# Patient Record
Sex: Female | Born: 1980 | Race: White | Hispanic: No | Marital: Single | Smoking: Current every day smoker
Health system: Southern US, Community
[De-identification: ages and names within clinical notes are randomized; demographics above are authoritative.]

## PROBLEM LIST (undated history)

## (undated) DIAGNOSIS — M62838 Other muscle spasm: Secondary | ICD-10-CM

## (undated) DIAGNOSIS — C4491 Basal cell carcinoma of skin, unspecified: Secondary | ICD-10-CM

## (undated) DIAGNOSIS — F988 Other specified behavioral and emotional disorders with onset usually occurring in childhood and adolescence: Secondary | ICD-10-CM

## (undated) DIAGNOSIS — F419 Anxiety disorder, unspecified: Secondary | ICD-10-CM

## (undated) DIAGNOSIS — I10 Essential (primary) hypertension: Secondary | ICD-10-CM

## (undated) DIAGNOSIS — Q230 Congenital stenosis of aortic valve: Secondary | ICD-10-CM

## (undated) DIAGNOSIS — K219 Gastro-esophageal reflux disease without esophagitis: Secondary | ICD-10-CM

## (undated) DIAGNOSIS — Z7901 Long term (current) use of anticoagulants: Secondary | ICD-10-CM

## (undated) DIAGNOSIS — T8859XA Other complications of anesthesia, initial encounter: Secondary | ICD-10-CM

## (undated) DIAGNOSIS — T7840XA Allergy, unspecified, initial encounter: Secondary | ICD-10-CM

## (undated) DIAGNOSIS — M1990 Unspecified osteoarthritis, unspecified site: Secondary | ICD-10-CM

## (undated) DIAGNOSIS — Z87410 Personal history of cervical dysplasia: Secondary | ICD-10-CM

## (undated) DIAGNOSIS — R42 Dizziness and giddiness: Secondary | ICD-10-CM

## (undated) DIAGNOSIS — H9190 Unspecified hearing loss, unspecified ear: Secondary | ICD-10-CM

## (undated) DIAGNOSIS — F411 Generalized anxiety disorder: Secondary | ICD-10-CM

## (undated) DIAGNOSIS — N903 Dysplasia of vulva, unspecified: Secondary | ICD-10-CM

## (undated) DIAGNOSIS — Z954 Presence of other heart-valve replacement: Secondary | ICD-10-CM

## (undated) DIAGNOSIS — Z8049 Family history of malignant neoplasm of other genital organs: Secondary | ICD-10-CM

## (undated) DIAGNOSIS — E785 Hyperlipidemia, unspecified: Secondary | ICD-10-CM

## (undated) DIAGNOSIS — F319 Bipolar disorder, unspecified: Secondary | ICD-10-CM

## (undated) DIAGNOSIS — F1011 Alcohol abuse, in remission: Secondary | ICD-10-CM

## (undated) DIAGNOSIS — N891 Moderate vaginal dysplasia: Secondary | ICD-10-CM

## (undated) DIAGNOSIS — G8929 Other chronic pain: Secondary | ICD-10-CM

## (undated) DIAGNOSIS — R51 Headache: Secondary | ICD-10-CM

## (undated) DIAGNOSIS — H9325 Central auditory processing disorder: Secondary | ICD-10-CM

## (undated) DIAGNOSIS — R519 Headache, unspecified: Secondary | ICD-10-CM

## (undated) DIAGNOSIS — Q231 Congenital insufficiency of aortic valve: Secondary | ICD-10-CM

## (undated) DIAGNOSIS — R87619 Unspecified abnormal cytological findings in specimens from cervix uteri: Secondary | ICD-10-CM

## (undated) DIAGNOSIS — Z720 Tobacco use: Secondary | ICD-10-CM

## (undated) HISTORY — DX: Congenital insufficiency of aortic valve: Q23.1

## (undated) HISTORY — DX: Other specified behavioral and emotional disorders with onset usually occurring in childhood and adolescence: F98.8

## (undated) HISTORY — PX: TYMPANOSTOMY TUBE PLACEMENT: SHX32

## (undated) HISTORY — DX: Family history of malignant neoplasm of other genital organs: Z80.49

## (undated) HISTORY — DX: Moderate vaginal dysplasia: N89.1

## (undated) HISTORY — DX: Essential (primary) hypertension: I10

## (undated) HISTORY — DX: Congenital stenosis of aortic valve: Q23.0

## (undated) HISTORY — DX: Allergy, unspecified, initial encounter: T78.40XA

## (undated) HISTORY — DX: Alcohol abuse, in remission: F10.11

## (undated) HISTORY — DX: Headache, unspecified: R51.9

## (undated) HISTORY — DX: Unspecified abnormal cytological findings in specimens from cervix uteri: R87.619

## (undated) HISTORY — DX: Unspecified osteoarthritis, unspecified site: M19.90

## (undated) HISTORY — DX: Bipolar disorder, unspecified: F31.9

## (undated) HISTORY — DX: Tobacco use: Z72.0

## (undated) HISTORY — PX: HERNIA REPAIR: SHX51

## (undated) HISTORY — PX: INGUINAL HERNIA REPAIR: SHX194

## (undated) HISTORY — DX: Basal cell carcinoma of skin, unspecified: C44.91

## (undated) HISTORY — DX: Headache: R51

## (undated) HISTORY — PX: HIP SURGERY: SHX245

---
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
## 2000-02-09 ENCOUNTER — Other Ambulatory Visit: Admission: RE | Admit: 2000-02-09 | Discharge: 2000-02-09 | Payer: Self-pay | Admitting: *Deleted

## 2000-04-09 ENCOUNTER — Encounter (INDEPENDENT_AMBULATORY_CARE_PROVIDER_SITE_OTHER): Payer: Self-pay

## 2000-04-09 ENCOUNTER — Other Ambulatory Visit: Admission: RE | Admit: 2000-04-09 | Discharge: 2000-04-09 | Payer: Self-pay | Admitting: Obstetrics and Gynecology

## 2000-07-31 HISTORY — PX: COLPOSCOPY: SHX161

## 2000-08-20 ENCOUNTER — Other Ambulatory Visit: Admission: RE | Admit: 2000-08-20 | Discharge: 2000-08-20 | Payer: Self-pay | Admitting: Obstetrics and Gynecology

## 2000-08-23 ENCOUNTER — Encounter (INDEPENDENT_AMBULATORY_CARE_PROVIDER_SITE_OTHER): Payer: Self-pay | Admitting: Specialist

## 2000-08-23 ENCOUNTER — Other Ambulatory Visit: Admission: RE | Admit: 2000-08-23 | Discharge: 2000-08-23 | Payer: Self-pay | Admitting: Obstetrics and Gynecology

## 2000-11-30 HISTORY — PX: NASAL SEPTUM SURGERY: SHX37

## 2000-12-21 ENCOUNTER — Other Ambulatory Visit: Admission: RE | Admit: 2000-12-21 | Discharge: 2000-12-21 | Payer: Self-pay | Admitting: Obstetrics and Gynecology

## 2001-05-02 ENCOUNTER — Other Ambulatory Visit: Admission: RE | Admit: 2001-05-02 | Discharge: 2001-05-02 | Payer: Self-pay | Admitting: Obstetrics and Gynecology

## 2001-06-30 HISTORY — PX: COLPOSCOPY: SHX161

## 2001-11-21 ENCOUNTER — Other Ambulatory Visit: Admission: RE | Admit: 2001-11-21 | Discharge: 2001-11-21 | Payer: Self-pay | Admitting: Obstetrics and Gynecology

## 2002-05-20 ENCOUNTER — Emergency Department (HOSPITAL_COMMUNITY): Admission: EM | Admit: 2002-05-20 | Discharge: 2002-05-20 | Payer: Self-pay | Admitting: Emergency Medicine

## 2002-08-10 ENCOUNTER — Other Ambulatory Visit: Admission: RE | Admit: 2002-08-10 | Discharge: 2002-08-10 | Payer: Self-pay | Admitting: Obstetrics and Gynecology

## 2004-01-04 ENCOUNTER — Other Ambulatory Visit: Admission: RE | Admit: 2004-01-04 | Discharge: 2004-01-04 | Payer: Self-pay | Admitting: Obstetrics and Gynecology

## 2004-08-01 ENCOUNTER — Other Ambulatory Visit: Admission: RE | Admit: 2004-08-01 | Discharge: 2004-08-01 | Payer: Self-pay | Admitting: Obstetrics and Gynecology

## 2004-08-30 HISTORY — PX: COLPOSCOPY: SHX161

## 2004-10-15 ENCOUNTER — Encounter: Admission: RE | Admit: 2004-10-15 | Discharge: 2004-10-15 | Payer: Self-pay | Admitting: Internal Medicine

## 2005-02-26 ENCOUNTER — Ambulatory Visit (HOSPITAL_COMMUNITY): Admission: RE | Admit: 2005-02-26 | Discharge: 2005-02-26 | Payer: Self-pay | Admitting: Internal Medicine

## 2005-04-29 ENCOUNTER — Other Ambulatory Visit: Admission: RE | Admit: 2005-04-29 | Discharge: 2005-04-29 | Payer: Self-pay | Admitting: *Deleted

## 2006-10-11 ENCOUNTER — Other Ambulatory Visit: Admission: RE | Admit: 2006-10-11 | Discharge: 2006-10-11 | Payer: Self-pay | Admitting: Obstetrics & Gynecology

## 2006-12-27 ENCOUNTER — Ambulatory Visit (HOSPITAL_COMMUNITY): Admission: RE | Admit: 2006-12-27 | Discharge: 2006-12-27 | Payer: Self-pay | Admitting: Obstetrics & Gynecology

## 2008-07-20 ENCOUNTER — Emergency Department (HOSPITAL_COMMUNITY): Admission: EM | Admit: 2008-07-20 | Discharge: 2008-07-20 | Payer: Self-pay | Admitting: Emergency Medicine

## 2008-10-30 HISTORY — PX: COLPOSCOPY: SHX161

## 2008-11-05 ENCOUNTER — Other Ambulatory Visit: Admission: RE | Admit: 2008-11-05 | Discharge: 2008-11-05 | Payer: Self-pay | Admitting: Obstetrics and Gynecology

## 2008-11-30 HISTORY — PX: CERVICAL BIOPSY  W/ LOOP ELECTRODE EXCISION: SUR135

## 2009-02-06 HISTORY — PX: ABDOMINAL HYSTERECTOMY: SHX81

## 2009-02-26 ENCOUNTER — Encounter: Payer: Self-pay | Admitting: Obstetrics and Gynecology

## 2009-02-26 ENCOUNTER — Ambulatory Visit (HOSPITAL_COMMUNITY): Admission: RE | Admit: 2009-02-26 | Discharge: 2009-02-27 | Payer: Self-pay | Admitting: Obstetrics and Gynecology

## 2009-02-26 HISTORY — PX: ROBOTIC ASSISTED TOTAL HYSTERECTOMY: SHX6085

## 2010-11-30 DIAGNOSIS — F1011 Alcohol abuse, in remission: Secondary | ICD-10-CM

## 2010-11-30 HISTORY — DX: Alcohol abuse, in remission: F10.11

## 2010-12-21 ENCOUNTER — Encounter: Payer: Self-pay | Admitting: Obstetrics & Gynecology

## 2010-12-23 LAB — HM PAP SMEAR: HM Pap smear: NORMAL

## 2011-02-13 ENCOUNTER — Ambulatory Visit: Payer: Self-pay | Admitting: Internal Medicine

## 2011-03-12 LAB — COMPREHENSIVE METABOLIC PANEL
ALT: 27 U/L (ref 0–35)
AST: 38 U/L — ABNORMAL HIGH (ref 0–37)
Albumin: 4 g/dL (ref 3.5–5.2)
Alkaline Phosphatase: 112 U/L (ref 39–117)
BUN: 9 mg/dL (ref 6–23)
CO2: 24 mEq/L (ref 19–32)
Calcium: 9.2 mg/dL (ref 8.4–10.5)
Chloride: 102 mEq/L (ref 96–112)
Creatinine, Ser: 0.7 mg/dL (ref 0.4–1.2)
GFR calc Af Amer: >60 mL/min (ref >60)
GFR calc non Af Amer: >60 mL/min (ref >60)
Glucose, Bld: 93 mg/dL (ref 70–99)
Potassium: 3.6 mEq/L (ref 3.5–5.1)
Sodium: 138 mEq/L (ref 135–145)
Total Bilirubin: 0.6 mg/dL (ref 0.3–1.2)
Total Protein: 6.8 g/dL (ref 6.0–8.3)

## 2011-03-12 LAB — CBC
HCT: 34 % — ABNORMAL LOW (ref 36.0–46.0)
HCT: 43.3 % (ref 36.0–46.0)
Hemoglobin: 11.9 g/dL — ABNORMAL LOW (ref 12.0–15.0)
Hemoglobin: 15 g/dL (ref 12.0–15.0)
MCHC: 34.6 g/dL (ref 30.0–36.0)
MCHC: 35 g/dL (ref 30.0–36.0)
MCV: 94.4 fL (ref 78.0–100.0)
MCV: 94.6 fL (ref 78.0–100.0)
Platelets: 151 10*3/uL (ref 150–400)
Platelets: 82 10*3/uL — ABNORMAL LOW (ref 150–400)
RBC: 3.59 MIL/uL — ABNORMAL LOW (ref 3.87–5.11)
RBC: 4.58 MIL/uL (ref 3.87–5.11)
RDW: 13.9 % (ref 11.5–15.5)
RDW: 14.1 % (ref 11.5–15.5)
WBC: 4.5 10*3/uL (ref 4.0–10.5)
WBC: 6.2 10*3/uL (ref 4.0–10.5)

## 2011-03-12 LAB — TYPE AND SCREEN
ABO/RH(D): O POS
Antibody Screen: NEGATIVE

## 2011-03-12 LAB — HCG, SERUM, QUALITATIVE: Preg, Serum: NEGATIVE

## 2011-03-12 LAB — ABO/RH: ABO/RH(D): O POS

## 2011-03-16 ENCOUNTER — Ambulatory Visit: Payer: Self-pay | Admitting: Internal Medicine

## 2011-04-14 NOTE — Op Note (Signed)
NAME:  Danielle Obrien, Danielle Obrien              ACCOUNT NO.:  [id number]   MEDICAL RECORD NO.:  [id number]          PATIENT TYPE:  OIB   LOCATION:  0098                         FACILITY:  [facility]   PHYSICIAN:  Cynthia P. 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   PROCEDURE:  Robotic total laparoscopic hysterectomy.   SURGEON:  Cynthia P. Romine, M.D.   ASSISTANT:  Dr. Leda Quail.   ANESTHESIA:  General endotracheal.   ESTIMATED BLOOD LOSS:  100 mL.   COMPLICATIONS:  None.   PROCEDURE:  The patient was taken to the operating room and placed in  the dorsal lithotomy position before anesthesia because she had  congenital dislocation of the hip and we want to be sure that she was  comfortable with her legs in the Tan[place]a stirrups.  After prepping and  draping, a posterior weighted and anterior Sims retractor were placed.  The cervix grasped on its anterior lip with a single-tooth tenaculum.  The vagina was very long and the instruments needed to be exchanged out  for a longer posterior weighted retractor in order to even visualize the  cervix.  The cervix was dilated to about a #23 Shawnie Pons and uterus sounded  to 8 cm.  A number 8 Rumi manipulator was inserted and the balloon  inflated with 5 mL of saline.  A vaginal occluder was inflated with 60  mL of air.  A Foley catheter was inserted.  Attention was then turned to  the abdomen.  A small incision was made just above the umbilicus after  infiltrating the skin with 0.25% Marcaine plain.  The incision was  carried down to the fascia bluntly.  The fascia was grasped with Kochers  and entered with Mayo scissors.   Underlying peritoneum was entered  bluntly.  The fascia was grasped with pickups with teeth and a  pursestring suture of 0 Vicryl was placed around the fascia.  A Hasson  trocar was then inserted into the peritoneal space with proper placement  noted with the robotic laparoscope.  A pneumoperitoneum was then created  with 2 liters of CO2.  The accessory ports were marked on the skin  approximately 10 cm lateral to the umbilicus and the robotic trocars  were inserted under direct visualization as was the assistance port  which was in the right lower quadrant the.  The patient was placed in  the Trendelenburg.  The pelvis was inspected.  There were several filmy  adhesions of omentum to the pelvic sidewall and also of the anterior  peritoneum to the fundus of the uterus.  The patient was status post  repair of bilateral hernias.  The uterus appeared to be of normal size.  The posterior cul-de-sac was free.  The left tube and ovary appeared  normal.  The right tube and ovary were  absent.  The ureters were  identified.  The patient was placed in steep Trendelenburg and the robot  was brought in and docked.  The monopolar scissors were put in the first  port and the Gyrus PK forceps were put in the second port.  The round  ligament on the patient's right was identified, cauterized and cut.  The  adhesions were taken down sharply that were formerly mentioned.  The  posterior and anterior leaf of the broad ligament were taken down  sharply.  The bladder was dissected off the cervix sharply.  On the  patient's right the pedicle containing the utero-ovarian ligament and  tube were cauterized and cut as was the round ligament.  The anterior  and posterior leaves of broad ligament were taken down sharply.  The  bladder was further dissected free off the cervix.  The Koh ring was  clearly visible and palpable.  The uterine arteries were skeletonized,  cauterized and cut on each side.  Monopolar  cautery was used to enter  the vagina anteriorly over the [place] ring and carried around  circumferentially.  The specimen was removed through the vagina and it  was actually left in place for the vaginal cuff closure to act as an  occluder.  The vaginal cuff was closed with 4 figure-of-eight sutures of  0 Vicryl.  Good hemostasis was noted.  The pelvis was irrigated.  All  the pedicles were inspected and felt to be hemostatic.  The ureters were  again identified bilaterally and were seen peristalsing.  The  instruments were removed from the abdomen.  The robot was undocked and  taken away.  The patient was flattened out.  The instruments were  removed under direct visualization.  Pneumoperitoneum was allowed to  escape.  The trocars were all removed.  The pursestring suture was  closed and then formally placed in the fascia at the umbilicus.  A  figure-of-eight suture of 0 Vicryl was placed in the assistance port to  close the fascia.  The skin was closed subcuticularly with 4-0 Vicryl  Rapide and Dermabond.  The specimen was removed from vagina and sent to  pathology and the procedure was terminated.  The patient tolerated it  well and went in satisfactory condition to post anesthesia recovery.      Cynthia P. Romine, M.D.  Electronically Signed     CPR/MEDQ  D:  02/26/2009  T:  02/26/2009  Job:  [id number]

## 2011-04-15 ENCOUNTER — Ambulatory Visit: Payer: Self-pay | Admitting: Internal Medicine

## 2011-04-21 ENCOUNTER — Telehealth: Payer: Self-pay | Admitting: *Deleted

## 2011-04-21 ENCOUNTER — Encounter: Payer: Self-pay | Admitting: Internal Medicine

## 2011-04-21 ENCOUNTER — Ambulatory Visit (INDEPENDENT_AMBULATORY_CARE_PROVIDER_SITE_OTHER)
Admission: RE | Admit: 2011-04-21 | Discharge: 2011-04-21 | Disposition: A | Discharge status: Home / Self Care | Payer: PRIVATE HEALTH INSURANCE | Source: Ambulatory Visit | Attending: Internal Medicine | Admitting: Internal Medicine

## 2011-04-21 ENCOUNTER — Ambulatory Visit (INDEPENDENT_AMBULATORY_CARE_PROVIDER_SITE_OTHER): Payer: PRIVATE HEALTH INSURANCE | Admitting: Internal Medicine

## 2011-04-21 DIAGNOSIS — R059 Cough, unspecified: Secondary | ICD-10-CM

## 2011-04-21 DIAGNOSIS — I10 Essential (primary) hypertension: Secondary | ICD-10-CM

## 2011-04-21 DIAGNOSIS — R05 Cough: Secondary | ICD-10-CM | POA: Insufficient documentation

## 2011-04-21 DIAGNOSIS — J209 Acute bronchitis, unspecified: Secondary | ICD-10-CM

## 2011-04-21 DIAGNOSIS — J4530 Mild persistent asthma, uncomplicated: Secondary | ICD-10-CM | POA: Insufficient documentation

## 2011-04-21 DIAGNOSIS — J45909 Unspecified asthma, uncomplicated: Secondary | ICD-10-CM

## 2011-04-21 DIAGNOSIS — S73005A Unspecified dislocation of left hip, initial encounter: Secondary | ICD-10-CM

## 2011-04-21 MED ORDER — MOXIFLOXACIN HCL 400 MG PO TABS: 400.0000 mg | ORAL_TABLET | Freq: Every day | ORAL | Status: AC

## 2011-04-21 MED ORDER — FLUTICASONE-SALMETEROL 250-50 MCG/DOSE IN AEPB: 1.0000 | INHALATION_SPRAY | Freq: Two times a day (BID) | RESPIRATORY_TRACT | Status: DC

## 2011-04-21 NOTE — Telephone Encounter (Signed)
Patient requesting a call back, has question about ortho referral.

## 2011-04-21 NOTE — Progress Notes (Signed)
Subjective:    Patient ID: Danielle Obrien, female    DOB: 04/18/1981, 30 y.o.   MRN: [id number]  URI  This is a new problem. The current episode started 1 to 4 weeks ago. The problem has been gradually worsening. There has been no fever. Associated symptoms include coughing, rhinorrhea, sneezing, a sore throat and wheezing. Pertinent negatives include no abdominal pain, chest pain, congestion, diarrhea, dysuria, ear pain, headaches, joint pain, joint swelling, nausea, neck pain, plugged ear sensation, rash, sinus pain, swollen glands or vomiting. She has tried nothing for the symptoms.  Hypertension This is a chronic problem. The current episode started more than 1 year ago. The problem has been gradually improving since onset. The problem is controlled. Pertinent negatives include no anxiety, blurred vision, chest pain, headaches, neck pain, orthopnea, palpitations, peripheral edema, PND, shortness of breath or sweats. There are no associated agents to hypertension. Past treatments include angiotensin blockers. The current treatment provides significant improvement. There are no compliance problems.    Also, she is having trouble with her left foot and leg, she feels like it is causing her to catch it on objects on the floor which makes her trip and fall. This has been a problem over the last year. She has not injured herself from any falls. 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    Review of Systems  Constitutional: Positive for chills and fatigue. Negative for fever, diaphoresis, activity change, appetite change and unexpected weight change.  HENT: Positive for sore throat, rhinorrhea and sneezing. Negative for ear pain, nosebleeds, congestion, facial swelling, mouth sores, trouble swallowing, neck pain, neck stiffness, voice change, postnasal drip and sinus pressure.   Eyes: Negative for blurred vision.  Respiratory: Positive for cough  and wheezing. Negative for shortness of breath.   Cardiovascular: Negative for chest pain, palpitations, orthopnea and PND.  Gastrointestinal: Negative for nausea, vomiting, abdominal pain, diarrhea and blood in stool.  Genitourinary: Negative for dysuria, urgency, frequency, hematuria, flank pain, decreased urine volume, enuresis, difficulty urinating, genital sores and dyspareunia.  Musculoskeletal: Negative for joint pain.  Skin: Negative for color change, pallor and rash.  Neurological: Negative for dizziness, tremors, seizures, syncope, facial asymmetry, speech difficulty, weakness, light-headedness, numbness and headaches.  Hematological: Negative for adenopathy. Does not bruise/bleed easily.  Psychiatric/Behavioral: Positive for decreased concentration (she has ADHD). Negative for suicidal ideas, hallucinations, behavioral problems, confusion, sleep disturbance, self-injury, dysphoric mood and agitation. The patient is not nervous/anxious and is not hyperactive.        Objective:   Physical Exam  Constitutional: She is oriented to person, place, and time. She appears well-developed and well-nourished. No distress.  HENT:  Head: Normocephalic and atraumatic.  Right Ear: External ear normal.  Left Ear: External ear normal.  Nose: Nose normal.  Mouth/Throat: Oropharynx is clear and moist. No oropharyngeal exudate.  Eyes: Conjunctivae and EOM are normal. Pupils are equal, round, and reactive to light. Right eye exhibits no discharge. Left eye exhibits no discharge. No scleral icterus.  Neck: Normal range of motion. Neck supple. No JVD present. No tracheal deviation present. No thyromegaly present.  Cardiovascular: Normal rate, regular rhythm and intact distal pulses.  Exam reveals no gallop and no friction rub.   Murmur heard. Pulmonary/Chest: Effort normal and breath sounds normal. No stridor. No respiratory distress. She has no wheezes. She has no rales. She exhibits no tenderness.    Abdominal: Soft. Bowel sounds are  normal. She exhibits no distension and no mass. There is no tenderness. There is no rebound and no guarding.  Musculoskeletal: Normal range of motion. She exhibits no edema and no tenderness.       Left foot is slightly inverted compared to the right  Lymphadenopathy:    She has no cervical adenopathy.  Neurological: She is alert and oriented to person, place, and time. She has normal reflexes. She displays normal reflexes. No cranial nerve deficit. She exhibits normal muscle tone. Coordination normal.  Skin: Skin is warm and dry. No rash noted. She is not diaphoretic. No erythema. No pallor.  Psychiatric: She has a normal mood and affect. Her behavior is normal. Judgment and thought content normal.          Assessment & Plan:

## 2011-04-21 NOTE — Patient Instructions (Signed)
Asthma, Adult Asthma is caused by narrowing of the air passages in the lungs. It may be triggered by pollen, dust, animal dander, molds, some foods, respiratory infections, exposure to smoke, exercise, emotional stress or other allergens (things that cause allergic reactions or allergies). Repeat attacks are common. HOME CARE INSTRUCTIONS  Use prescription medications as ordered by your caregiver.   Avoid pollen, dust, animal dander, molds, smoke and other things that cause attacks at home and at work.   You may have fewer attacks if you decrease dust in your home. Electrostatic air cleaners may help.   It may help to replace your pillows or mattress with materials less likely to cause allergies.   Talk to your caregiver about an action plan for managing asthma attacks at home, including, the use of a peak flow meter which measures the severity of your asthma attack. An action plan can help minimize or stop the attack without having to seek medical care.   If you are not on a fluid restriction, drink 8 to 10 glasses of water each day.   Always have a plan prepared for seeking medical attention, including, calling your physician, accessing local emergency care, and calling 911 (in the [place] for a severe attack.   Discuss possible exercise routines with your caregiver.   If animal dander is the cause of asthma, you may need to get rid of pets.  SEEK MEDICAL CARE IF:  You have wheezing and shortness of breath even if taking medicine to prevent attacks.   An oral temperature above 100.5 develops.   You have muscle aches, chest pain or thickening of sputum.   Your sputum changes from clear or white to yellow, green, gray or bloody.   You have any problems that may be related to the medicine you are taking (such as a rash, itching, swelling or trouble breathing).  SEEK IMMEDIATE MEDICAL CARE IF:  Your usual medicines do not stop your wheezing or there is increased coughing and/or  shortness of breath.   You have increased difficulty breathing.   You have an oral temperature above 100.5, not controlled by medicine.  MAKE SURE YOU:  Understand these instructions.   Will watch your condition.   Will get help right away if you are not doing well or get worse.  Document Released: 11/16/2005 Document Re-Released: 12/08/2009 Exit[employer] Patient Information 2011 [employer]. 

## 2011-04-22 NOTE — Telephone Encounter (Signed)
Returned call, lmovm for pt to call back and advise how we may help.

## 2011-04-23 NOTE — Telephone Encounter (Signed)
LMOVM for pt to call back 

## 2011-04-24 ENCOUNTER — Telehealth: Payer: Self-pay | Admitting: *Deleted

## 2011-04-24 NOTE — Telephone Encounter (Signed)
Called patient/ lmovm x several days, closing phone note due to no call back

## 2011-04-24 NOTE — Telephone Encounter (Signed)
Pt says MD was going to refer her to ortho for her hip? IF ok please put in referral.

## 2011-04-27 ENCOUNTER — Encounter: Payer: Self-pay | Admitting: Internal Medicine

## 2011-04-27 DIAGNOSIS — S73005A Unspecified dislocation of left hip, initial encounter: Secondary | ICD-10-CM | POA: Insufficient documentation

## 2011-04-27 NOTE — Assessment & Plan Note (Signed)
Check a cxr for pna, masses, edema 

## 2011-04-27 NOTE — Assessment & Plan Note (Signed)
Ortho referral  

## 2011-04-27 NOTE — Assessment & Plan Note (Signed)
Start advair diskus 

## 2011-04-27 NOTE — Assessment & Plan Note (Signed)
Start avelox and zutripro 

## 2011-04-27 NOTE — Assessment & Plan Note (Signed)
Her BP is well controlled 

## 2011-05-18 ENCOUNTER — Telehealth: Payer: Self-pay

## 2011-05-18 NOTE — Telephone Encounter (Signed)
Patient called  Stating that MD referred her to ortho for her hip x 3 wks ago. She is calling to check status of referral. Please advise

## 2011-09-18 ENCOUNTER — Telehealth: Payer: Self-pay

## 2011-09-18 MED ORDER — VALSARTAN 160 MG PO TABS: 160.0000 mg | ORAL_TABLET | Freq: Every day | ORAL | Status: DC

## 2011-09-18 NOTE — Telephone Encounter (Signed)
Grandmother stopped by and discussed with her. Rx sent in and pt advised to schedule appt

## 2011-09-18 NOTE — Telephone Encounter (Signed)
Patient called lmovm requesting refills on BP med. Patient last seen 03/2011, but no medication on current list. Tried to return call to patient/ no answer. Will try again later

## 2011-09-22 ENCOUNTER — Telehealth: Payer: Self-pay

## 2011-09-22 DIAGNOSIS — I10 Essential (primary) hypertension: Secondary | ICD-10-CM

## 2011-09-22 MED ORDER — LOSARTAN POTASSIUM 100 MG PO TABS: 100.0000 mg | ORAL_TABLET | Freq: Every day | ORAL | Status: DC

## 2011-09-22 NOTE — Telephone Encounter (Signed)
Per pharmacy,diovan not covered by insurance. Alternatives are Losartan and irbesartan

## 2011-09-22 NOTE — Telephone Encounter (Signed)
done

## 2011-10-20 ENCOUNTER — Other Ambulatory Visit (INDEPENDENT_AMBULATORY_CARE_PROVIDER_SITE_OTHER): Payer: PRIVATE HEALTH INSURANCE

## 2011-10-20 ENCOUNTER — Other Ambulatory Visit: Payer: Self-pay | Admitting: Internal Medicine

## 2011-10-20 ENCOUNTER — Encounter: Payer: Self-pay | Admitting: Internal Medicine

## 2011-10-20 ENCOUNTER — Ambulatory Visit (INDEPENDENT_AMBULATORY_CARE_PROVIDER_SITE_OTHER): Payer: PRIVATE HEALTH INSURANCE | Admitting: Internal Medicine

## 2011-10-20 VITALS — BP 108/76 | HR 76 | Temp 98.0°F | Resp 16 | Ht 60.0 in | Wt 129.5 lb

## 2011-10-20 DIAGNOSIS — Z0000 Encounter for general adult medical examination without abnormal findings: Secondary | ICD-10-CM

## 2011-10-20 DIAGNOSIS — Z23 Encounter for immunization: Secondary | ICD-10-CM

## 2011-10-20 LAB — COMPREHENSIVE METABOLIC PANEL
ALT: 13 U/L (ref 0–35)
AST: 16 U/L (ref 0–37)
Albumin: 4.5 g/dL (ref 3.5–5.2)
Alkaline Phosphatase: 87 U/L (ref 39–117)
BUN: 6 mg/dL (ref 6–23)
CO2: 25 mEq/L (ref 19–32)
Calcium: 9.8 mg/dL (ref 8.4–10.5)
Chloride: 102 mEq/L (ref 96–112)
Creatinine, Ser: 0.9 mg/dL (ref 0.4–1.2)
GFR: 78.4 mL/min (ref >60.00)
Glucose, Bld: 91 mg/dL (ref 70–99)
Potassium: 4 mEq/L (ref 3.5–5.1)
Sodium: 137 mEq/L (ref 135–145)
Total Bilirubin: 0.6 mg/dL (ref 0.3–1.2)
Total Protein: 7.7 g/dL (ref 6.0–8.3)

## 2011-10-20 LAB — CBC WITH DIFFERENTIAL/PLATELET
Basophils Absolute: 0.1 10*3/uL (ref 0.0–0.1)
Basophils Relative: 0.9 % (ref 0.0–3.0)
Eosinophils Absolute: 0.2 10*3/uL (ref 0.0–0.7)
Eosinophils Relative: 2.9 % (ref 0.0–5.0)
HCT: 42.2 % (ref 36.0–46.0)
Hemoglobin: 14.4 g/dL (ref 12.0–15.0)
Lymphocytes Relative: 31.8 % (ref 12.0–46.0)
Lymphs Abs: 1.9 10*3/uL (ref 0.7–4.0)
MCHC: 34.2 g/dL (ref 30.0–36.0)
MCV: 94.3 fl (ref 78.0–100.0)
Monocytes Absolute: 0.4 10*3/uL (ref 0.1–1.0)
Monocytes Relative: 7.1 % (ref 3.0–12.0)
Neutro Abs: 3.5 10*3/uL (ref 1.4–7.7)
Neutrophils Relative %: 57.3 % (ref 43.0–77.0)
Platelets: 199 10*3/uL (ref 150.0–400.0)
RBC: 4.47 Mil/uL (ref 3.87–5.11)
RDW: 12.8 % (ref 11.5–14.6)
WBC: 6.1 10*3/uL (ref 4.5–10.5)

## 2011-10-20 LAB — URINALYSIS, ROUTINE W REFLEX MICROSCOPIC
Nitrite: NEGATIVE
Specific Gravity, Urine: 1.015 (ref 1.000–1.030)
Total Protein, Urine: NEGATIVE
pH: 6 (ref 5.0–8.0)

## 2011-10-20 LAB — LIPID PANEL
Cholesterol: 283 mg/dL — ABNORMAL HIGH (ref 0–200)
HDL: 75.3 mg/dL (ref >39.00)
Total CHOL/HDL Ratio: 4
Triglycerides: 62 mg/dL (ref 0.0–149.0)
VLDL: 12.4 mg/dL (ref 0.0–40.0)

## 2011-10-20 LAB — TSH: TSH: 0.51 u[IU]/mL (ref 0.35–5.50)

## 2011-10-20 NOTE — Patient Instructions (Signed)

## 2011-10-20 NOTE — Progress Notes (Signed)
  Subjective:    Patient ID: Danielle Obrien, female    DOB: Apr 29, 1981, 30 y.o.   MRN: [id number]  HPI She returns for a physical and she offers no complaints. She is not ready to stop smoking.  Review of Systems  Constitutional: Negative for fever, chills, diaphoresis, activity change, appetite change, fatigue and unexpected weight change.  HENT: Negative.   Eyes: Negative.   Respiratory: Negative for cough, chest tightness, shortness of breath, wheezing and stridor.   Cardiovascular: Negative for chest pain, palpitations and leg swelling.  Gastrointestinal: Negative for nausea, vomiting, abdominal pain, diarrhea and constipation.  Genitourinary: Negative for dysuria, urgency, frequency, hematuria, flank pain, decreased urine volume, vaginal bleeding, vaginal discharge, enuresis, difficulty urinating, genital sores, vaginal pain, menstrual problem, pelvic pain and dyspareunia.  Musculoskeletal: Negative for myalgias, back pain, joint swelling, arthralgias and gait problem.  Skin: Negative for color change, pallor, rash and wound.  Neurological: Negative for dizziness, tremors, seizures, syncope, facial asymmetry, speech difficulty, weakness, light-headedness, numbness and headaches.  Hematological: Negative for adenopathy. Does not bruise/bleed easily.  Psychiatric/Behavioral: Negative.        Objective:   Physical Exam  Vitals reviewed. Constitutional: She is oriented to person, place, and time. She appears well-developed and well-nourished. No distress.  HENT:  Head: Normocephalic and atraumatic.  Mouth/Throat: Oropharynx is clear and moist. No oropharyngeal exudate.  Eyes: Conjunctivae are normal. Right eye exhibits no discharge. Left eye exhibits no discharge. No scleral icterus.  Neck: Normal range of motion. Neck supple. No JVD present. No tracheal deviation present. No thyromegaly present.  Cardiovascular: Normal rate, regular rhythm, normal heart sounds and intact distal  pulses.  Exam reveals no gallop and no friction rub.   No murmur heard. Pulmonary/Chest: Effort normal and breath sounds normal. No stridor. No respiratory distress. She has no wheezes. She has no rales. She exhibits no tenderness.  Abdominal: Soft. Bowel sounds are normal. She exhibits no distension and no mass. There is no tenderness. There is no rebound and no guarding.  Musculoskeletal: Normal range of motion. She exhibits no edema and no tenderness.  Lymphadenopathy:    She has no cervical adenopathy.  Neurological: She is oriented to person, place, and time.  Skin: Skin is warm and dry. No rash noted. She is not diaphoretic. No erythema. No pallor.  Psychiatric: She has a normal mood and affect. Her behavior is normal. Judgment and thought content normal.     Lab Results  Component Value Date   WBC 6.2 02/27/2009   HGB 11.9* 02/27/2009   HCT 34.0* 02/27/2009   PLT 82* 02/27/2009   GLUCOSE 93 02/21/2009   ALT 27 02/21/2009   AST 38* 02/21/2009   NA 138 02/21/2009   K 3.6 02/21/2009   CL 102 02/21/2009   CREATININE 0.70 02/21/2009   BUN 9 02/21/2009   CO2 24 02/21/2009       Assessment & Plan:

## 2011-10-21 LAB — LDL CHOLESTEROL, DIRECT: Direct LDL: 207.5 mg/dL

## 2011-10-21 NOTE — Assessment & Plan Note (Signed)
Exam done, labs ordered, vaccines updated, pt ed material was given 

## 2011-10-25 ENCOUNTER — Encounter: Payer: Self-pay | Admitting: Internal Medicine

## 2012-12-15 ENCOUNTER — Telehealth: Payer: Self-pay

## 2012-12-15 DIAGNOSIS — I10 Essential (primary) hypertension: Secondary | ICD-10-CM

## 2012-12-15 MED ORDER — LOSARTAN POTASSIUM 100 MG PO TABS: 100.0000 mg | ORAL_TABLET | Freq: Every day | ORAL | Status: DC

## 2012-12-15 NOTE — Telephone Encounter (Signed)
Pt called lMOVM requesting rx for bp medication, pt last seen 10/2011 and has appt set to see MD 01/23/13. One month supply only sent to pharmacy, no futher refills will be given. Called pt, no answer, pharmacy to notify

## 2013-01-09 ENCOUNTER — Other Ambulatory Visit: Payer: Self-pay | Admitting: Internal Medicine

## 2013-01-24 ENCOUNTER — Encounter: Payer: PRIVATE HEALTH INSURANCE | Admitting: Internal Medicine

## 2013-01-25 ENCOUNTER — Encounter: Payer: PRIVATE HEALTH INSURANCE | Admitting: Internal Medicine

## 2013-01-27 ENCOUNTER — Ambulatory Visit (INDEPENDENT_AMBULATORY_CARE_PROVIDER_SITE_OTHER): Payer: Managed Care, Other (non HMO) | Admitting: Internal Medicine

## 2013-01-27 ENCOUNTER — Other Ambulatory Visit (INDEPENDENT_AMBULATORY_CARE_PROVIDER_SITE_OTHER): Payer: Managed Care, Other (non HMO)

## 2013-01-27 ENCOUNTER — Encounter: Payer: Self-pay | Admitting: Internal Medicine

## 2013-01-27 VITALS — BP 126/64 | HR 102 | Temp 97.5°F | Resp 16 | Wt 119.2 lb

## 2013-01-27 DIAGNOSIS — R011 Cardiac murmur, unspecified: Secondary | ICD-10-CM

## 2013-01-27 DIAGNOSIS — Q2381 Bicuspid aortic valve: Secondary | ICD-10-CM

## 2013-01-27 DIAGNOSIS — G43901 Migraine, unspecified, not intractable, with status migrainosus: Secondary | ICD-10-CM

## 2013-01-27 DIAGNOSIS — I10 Essential (primary) hypertension: Secondary | ICD-10-CM

## 2013-01-27 DIAGNOSIS — Q231 Congenital insufficiency of aortic valve: Secondary | ICD-10-CM

## 2013-01-27 DIAGNOSIS — E785 Hyperlipidemia, unspecified: Secondary | ICD-10-CM

## 2013-01-27 DIAGNOSIS — Z23 Encounter for immunization: Secondary | ICD-10-CM

## 2013-01-27 DIAGNOSIS — J45909 Unspecified asthma, uncomplicated: Secondary | ICD-10-CM

## 2013-01-27 HISTORY — DX: Hyperlipidemia, unspecified: E78.5

## 2013-01-27 LAB — URINALYSIS, ROUTINE W REFLEX MICROSCOPIC
Ketones, ur: NEGATIVE
Specific Gravity, Urine: <=1.005 (ref 1.000–1.030)
Total Protein, Urine: NEGATIVE
Urine Glucose: NEGATIVE
Urobilinogen, UA: 0.2 (ref 0.0–1.0)
pH: 6 (ref 5.0–8.0)

## 2013-01-27 LAB — LDL CHOLESTEROL, DIRECT: Direct LDL: 171 mg/dL

## 2013-01-27 LAB — COMPREHENSIVE METABOLIC PANEL
BUN: 10 mg/dL (ref 6–23)
CO2: 29 mEq/L (ref 19–32)
GFR: 73.02 mL/min (ref >60.00)
Glucose, Bld: 69 mg/dL — ABNORMAL LOW (ref 70–99)
Sodium: 138 mEq/L (ref 135–145)
Total Bilirubin: 0.6 mg/dL (ref 0.3–1.2)
Total Protein: 7.2 g/dL (ref 6.0–8.3)

## 2013-01-27 LAB — CBC WITH DIFFERENTIAL/PLATELET
Eosinophils Relative: 2.6 % (ref 0.0–5.0)
HCT: 43.7 % (ref 36.0–46.0)
Lymphs Abs: 2.2 10*3/uL (ref 0.7–4.0)
Monocytes Relative: 6.2 % (ref 3.0–12.0)
Neutrophils Relative %: 53.3 % (ref 43.0–77.0)
Platelets: 215 10*3/uL (ref 150.0–400.0)
RBC: 4.92 Mil/uL (ref 3.87–5.11)
WBC: 5.9 10*3/uL (ref 4.5–10.5)

## 2013-01-27 LAB — SEDIMENTATION RATE: Sed Rate: 13 mm/hr (ref 0–22)

## 2013-01-27 LAB — LIPID PANEL
Cholesterol: 245 mg/dL — ABNORMAL HIGH (ref 0–200)
Triglycerides: 43 mg/dL (ref 0.0–149.0)

## 2013-01-27 MED ORDER — OXYCODONE-ACETAMINOPHEN 7.5-325 MG PO TABS: 1.0000 | ORAL_TABLET | ORAL | Status: DC | PRN

## 2013-01-27 MED ORDER — ELETRIPTAN HYDROBROMIDE 40 MG PO TABS: 40.0000 mg | ORAL_TABLET | ORAL | Status: DC | PRN

## 2013-01-27 MED ORDER — PROMETHAZINE HCL 12.5 MG PO TABS: 12.5000 mg | ORAL_TABLET | Freq: Four times a day (QID) | ORAL | Status: DC | PRN

## 2013-01-27 NOTE — Progress Notes (Signed)
Subjective:    Patient ID: Danielle Obrien, female    DOB: [birth year], 32 y.o.   MRN: [id number]  Migraine  This is a recurrent problem. Episode onset: 3 days ago. The problem occurs constantly. The problem has been unchanged. The pain is located in the temporal and left unilateral region. The pain does not radiate. The pain quality is similar to prior headaches. The quality of the pain is described as throbbing, band-like and pulsating. The pain is at a severity of 4/10. The pain is moderate. Associated symptoms include dizziness, nausea, phonophobia, photophobia and tinnitus. Pertinent negatives include no abdominal pain, abnormal behavior, anorexia, back pain, blurred vision, coughing, drainage, ear pain, eye pain, eye redness, eye watering, facial sweating, fever, hearing loss, insomnia, loss of balance, muscle aches, neck pain, numbness, rhinorrhea, scalp tenderness, seizures, sinus pressure, sore throat, swollen glands, tingling, visual change, vomiting, weakness or weight loss. The symptoms are aggravated by emotional stress. She has tried Excedrin for the symptoms. The treatment provided mild relief. Her past medical history is significant for migraine headaches and migraines in the family. There is no history of cancer, hypertension, obesity or recent head traumas.      Review of Systems  Constitutional: Negative for fever, chills, weight loss, diaphoresis, activity change, appetite change, fatigue and unexpected weight change.  HENT: Positive for tinnitus. Negative for hearing loss, ear pain, congestion, sore throat, facial swelling, rhinorrhea, neck pain, neck stiffness, voice change and sinus pressure.   Eyes: Positive for photophobia. Negative for blurred vision, pain, redness and visual disturbance.  Respiratory: Negative for cough, chest tightness, shortness of breath, wheezing and stridor.   Gastrointestinal: Positive for nausea. Negative for vomiting, abdominal pain, diarrhea,  constipation and anorexia.  Endocrine: Negative.   Genitourinary: Negative for dysuria, urgency, frequency, hematuria, enuresis, difficulty urinating and dyspareunia.  Musculoskeletal: Negative for myalgias, back pain, joint swelling, arthralgias and gait problem.  Skin: Negative.   Allergic/Immunologic: Negative.   Neurological: Positive for dizziness. Negative for tingling, tremors, seizures, syncope, facial asymmetry, speech difficulty, weakness, light-headedness, numbness, headaches and loss of balance.  Hematological: Negative for adenopathy. Does not bruise/bleed easily.  Psychiatric/Behavioral: Negative.  The patient does not have insomnia.        Objective:   Physical Exam  Vitals reviewed. Constitutional: She is oriented to person, place, and time. She appears well-developed and well-nourished.  Non-toxic appearance. She does not have a sickly appearance. She does not appear Obrien. No distress.  HENT:  Head: Normocephalic and atraumatic.  Mouth/Throat: Oropharynx is clear and moist. No oropharyngeal exudate.  Eyes: Conjunctivae and EOM are normal. Pupils are equal, round, and reactive to light. Right eye exhibits no discharge. Left eye exhibits no discharge. No scleral icterus.  Neck: Normal range of motion. Neck supple. No JVD present. No tracheal deviation present. No thyromegaly present.  Cardiovascular: Normal rate, regular rhythm, S1 normal, S2 normal and intact distal pulses.  Exam reveals friction rub. Exam reveals no gallop, no S3, no S4 and no distant heart sounds.   Murmur heard.  Decrescendo systolic murmur is present with a grade of 2/6   No diastolic murmur is present  Pulses:      Carotid pulses are 1+ on the right side, and 1+ on the left side.      Radial pulses are 1+ on the right side, and 1+ on the left side.       Femoral pulses are 1+ on the right side, and 1+ on the left side.  Popliteal pulses are 1+ on the right side, and 1+ on the left side.        Dorsalis pedis pulses are 1+ on the right side, and 1+ on the left side.       Posterior tibial pulses are 1+ on the right side, and 1+ on the left side.  Pulmonary/Chest: Effort normal and breath sounds normal. No stridor. No respiratory distress. She has no wheezes. She has no rales. She exhibits no tenderness.  Abdominal: Soft. Bowel sounds are normal. She exhibits no distension. There is no tenderness. There is no rebound and no guarding.  Musculoskeletal: Normal range of motion. She exhibits no edema and no tenderness.  Lymphadenopathy:    She has no cervical adenopathy.  Neurological: She is alert and oriented to person, place, and time. She has normal strength. She displays no atrophy, no tremor and normal reflexes. No cranial nerve deficit or sensory deficit. She exhibits normal muscle tone. She displays a negative Romberg sign. She displays no seizure activity. Coordination and gait normal. She displays no Babinski's sign on the right side. She displays no Babinski's sign on the left side.  Reflex Scores:      Tricep reflexes are 1+ on the right side and 1+ on the left side.      Bicep reflexes are 1+ on the right side and 1+ on the left side.      Brachioradialis reflexes are 1+ on the right side and 1+ on the left side.      Patellar reflexes are 1+ on the right side and 1+ on the left side.      Achilles reflexes are 1+ on the right side and 1+ on the left side. Skin: Skin is warm and dry. No rash noted. She is not diaphoretic. No erythema. No pallor.  Psychiatric: She has a normal mood and affect. Her behavior is normal. Judgment and thought content normal.      Lab Results  Component Value Date   WBC 6.1 10/20/2011   HGB 14.4 10/20/2011   HCT 42.2 10/20/2011   PLT 199.0 10/20/2011   GLUCOSE 91 10/20/2011   CHOL 283* 10/20/2011   TRIG 62.0 10/20/2011   HDL 75.30 10/20/2011   LDLDIRECT 207.5 10/20/2011   ALT 13 10/20/2011   AST 16 10/20/2011   NA 137 10/20/2011   K 4.0  10/20/2011   CL 102 10/20/2011   CREATININE 0.9 10/20/2011   BUN 6 10/20/2011   CO2 25 10/20/2011   TSH 0.51 10/20/2011      Assessment & Plan:

## 2013-01-27 NOTE — Patient Instructions (Signed)
Preventive Care for Adults, Female A healthy lifestyle and preventive care can promote health and wellness. Preventive health guidelines for women include the following key practices.  A routine yearly physical is a good way to check with your caregiver about your health and preventive screening. It is a chance to share any concerns and updates on your health, and to receive a thorough exam.  Visit your dentist for a routine exam and preventive care every 6 months. Brush your teeth twice a day and floss once a day. Good oral hygiene prevents tooth decay and gum disease.  The frequency of eye exams is based on your age, health, family medical history, use of contact lenses, and other factors. Follow your caregiver's recommendations for frequency of eye exams.  Eat a healthy diet. Foods like vegetables, fruits, whole grains, low-fat dairy products, and lean protein foods contain the nutrients you need without too many calories. Decrease your intake of foods high in solid fats, added sugars, and salt. Eat the right amount of calories for you.Get information about a proper diet from your caregiver, if necessary.  Regular physical exercise is one of the most important things you can do for your health. Most adults should get at least 150 minutes of moderate-intensity exercise (any activity that increases your heart rate and causes you to sweat) each week. In addition, most adults need muscle-strengthening exercises on 2 or more days a week.  Maintain a healthy weight. The body mass index (BMI) is a screening tool to identify possible weight problems. It provides an estimate of body fat based on height and weight. Your caregiver can help determine your BMI, and can help you achieve or maintain a healthy weight.For adults 20 years and older:  A BMI below 18.5 is considered underweight.  A BMI of 18.5 to 24.9 is normal.  A BMI of 25 to 29.9 is considered overweight.  A BMI of 30 and above is  considered obese.  Maintain normal blood lipids and cholesterol levels by exercising and minimizing your intake of saturated fat. Eat a balanced diet with plenty of fruit and vegetables. Blood tests for lipids and cholesterol should begin at age 20 and be repeated every 5 years. If your lipid or cholesterol levels are high, you are over 50, or you are at high risk for heart disease, you may need your cholesterol levels checked more frequently.Ongoing high lipid and cholesterol levels should be treated with medicines if diet and exercise are not effective.  If you smoke, find out from your caregiver how to quit. If you do not use tobacco, do not start.  If you are pregnant, do not drink alcohol. If you are breastfeeding, be very cautious about drinking alcohol. If you are not pregnant and choose to drink alcohol, do not exceed 1 drink per day. One drink is considered to be 12 ounces (355 mL) of beer, 5 ounces (148 mL) of wine, or 1.5 ounces (44 mL) of liquor.  Avoid use of street drugs. Do not share needles with anyone. Ask for help if you need support or instructions about stopping the use of drugs.  High blood pressure causes heart disease and increases the risk of stroke. Your blood pressure should be checked at least every 1 to 2 years. Ongoing high blood pressure should be treated with medicines if weight loss and exercise are not effective.  If you are 55 to 33 years old, ask your caregiver if you should take aspirin to prevent strokes.  Diabetes   screening involves taking a blood sample to check your fasting blood sugar level. This should be done once every 3 years, after age 45, if you are within normal weight and without risk factors for diabetes. Testing should be considered at a younger age or be carried out more frequently if you are overweight and have at least 1 risk factor for diabetes.  Breast cancer screening is essential preventive care for women. You should practice "breast  self-awareness." This means understanding the normal appearance and feel of your breasts and may include breast self-examination. Any changes detected, no matter how small, should be reported to a caregiver. Women in their 20s and 30s should have a clinical breast exam (CBE) by a caregiver as part of a regular health exam every 1 to 3 years. After age 40, women should have a CBE every year. Starting at age 40, women should consider having a mammography (breast X-ray test) every year. Women who have a family history of breast cancer should talk to their caregiver about genetic screening. Women at a high risk of breast cancer should talk to their caregivers about having magnetic resonance imaging (MRI) and a mammography every year.  The Pap test is a screening test for cervical cancer. A Pap test can show cell changes on the cervix that might become cervical cancer if left untreated. A Pap test is a procedure in which cells are obtained and examined from the lower end of the uterus (cervix).  Women should have a Pap test starting at age 21.  Between ages 21 and 29, Pap tests should be repeated every 2 years.  Beginning at age 30, you should have a Pap test every 3 years as long as the past 3 Pap tests have been normal.  Some women have medical problems that increase the chance of getting cervical cancer. Talk to your caregiver about these problems. It is especially important to talk to your caregiver if a new problem develops soon after your last Pap test. In these cases, your caregiver may recommend more frequent screening and Pap tests.  The above recommendations are the same for women who have or have not gotten the vaccine for human papillomavirus (HPV).  If you had a hysterectomy for a problem that was not cancer or a condition that could lead to cancer, then you no longer need Pap tests. Even if you no longer need a Pap test, a regular exam is a good idea to make sure no other problems are  starting.  If you are between ages 65 and 70, and you have had normal Pap tests going back 10 years, you no longer need Pap tests. Even if you no longer need a Pap test, a regular exam is a good idea to make sure no other problems are starting.  If you have had past treatment for cervical cancer or a condition that could lead to cancer, you need Pap tests and screening for cancer for at least 20 years after your treatment.  If Pap tests have been discontinued, risk factors (such as a new sexual partner) need to be reassessed to determine if screening should be resumed.  The HPV test is an additional test that may be used for cervical cancer screening. The HPV test looks for the virus that can cause the cell changes on the cervix. The cells collected during the Pap test can be tested for HPV. The HPV test could be used to screen women aged 30 years and older, and should   be used in women of any age who have unclear Pap test results. After the age of 30, women should have HPV testing at the same frequency as a Pap test.  Colorectal cancer can be detected and often prevented. Most routine colorectal cancer screening begins at the age of 50 and continues through age 75. However, your caregiver may recommend screening at an earlier age if you have risk factors for colon cancer. On a yearly basis, your caregiver may provide home test kits to check for hidden blood in the stool. Use of a small camera at the end of a tube, to directly examine the colon (sigmoidoscopy or colonoscopy), can detect the earliest forms of colorectal cancer. Talk to your caregiver about this at age 50, when routine screening begins. Direct examination of the colon should be repeated every 5 to 10 years through age 75, unless early forms of pre-cancerous polyps or small growths are found.  Hepatitis C blood testing is recommended for all people born from 1945 through 1965 and any individual with known risks for hepatitis C.  Practice  safe sex. Use condoms and avoid high-risk sexual practices to reduce the spread of sexually transmitted infections (STIs). STIs include gonorrhea, chlamydia, syphilis, trichomonas, herpes, HPV, and human immunodeficiency virus (HIV). Herpes, HIV, and HPV are viral illnesses that have no cure. They can result in disability, cancer, and death. Sexually active women aged 25 and younger should be checked for chlamydia. Older women with new or multiple partners should also be tested for chlamydia. Testing for other STIs is recommended if you are sexually active and at increased risk.  Osteoporosis is a disease in which the bones lose minerals and strength with aging. This can result in serious bone fractures. The risk of osteoporosis can be identified using a bone density scan. Women ages 65 and over and women at risk for fractures or osteoporosis should discuss screening with their caregivers. Ask your caregiver whether you should take a calcium supplement or vitamin D to reduce the rate of osteoporosis.  Menopause can be associated with physical symptoms and risks. Hormone replacement therapy is available to decrease symptoms and risks. You should talk to your caregiver about whether hormone replacement therapy is right for you.  Use sunscreen with sun protection factor (SPF) of 30 or more. Apply sunscreen liberally and repeatedly throughout the day. You should seek shade when your shadow is shorter than you. Protect yourself by wearing long sleeves, pants, a wide-brimmed hat, and sunglasses year round, whenever you are outdoors.  Once a month, do a whole body skin exam, using a mirror to look at the skin on your back. Notify your caregiver of new moles, moles that have irregular borders, moles that are larger than a pencil eraser, or moles that have changed in shape or color.  Stay current with required immunizations.  Influenza. You need a dose every fall (or winter). The composition of the flu vaccine  changes each year, so being vaccinated once is not enough.  Pneumococcal polysaccharide. You need 1 to 2 doses if you smoke cigarettes or if you have certain chronic medical conditions. You need 1 dose at age 65 (or older) if you have never been vaccinated.  Tetanus, diphtheria, pertussis (Tdap, Td). Get 1 dose of Tdap vaccine if you are younger than age 65, are over 65 and have contact with an infant, are a healthcare worker, are pregnant, or simply want to be protected from whooping cough. After that, you need a Td   booster dose every 10 years. Consult your caregiver if you have not had at least 3 tetanus and diphtheria-containing shots sometime in your life or have a deep or dirty wound.  HPV. You need this vaccine if you are a woman age 26 or younger. The vaccine is given in 3 doses over 6 months.  Measles, mumps, rubella (MMR). You need at least 1 dose of MMR if you were born in 1957 or later. You may also need a second dose.  Meningococcal. If you are age 19 to 21 and a first-year college student living in a residence hall, or have one of several medical conditions, you need to get vaccinated against meningococcal disease. You may also need additional booster doses.  Zoster (shingles). If you are age 60 or older, you should get this vaccine.  Varicella (chickenpox). If you have never had chickenpox or you were vaccinated but received only 1 dose, talk to your caregiver to find out if you need this vaccine.  Hepatitis A. You need this vaccine if you have a specific risk factor for hepatitis A virus infection or you simply wish to be protected from this disease. The vaccine is usually given as 2 doses, 6 to 18 months apart.  Hepatitis B. You need this vaccine if you have a specific risk factor for hepatitis B virus infection or you simply wish to be protected from this disease. The vaccine is given in 3 doses, usually over 6 months. Preventive Services / Frequency Ages 19 to 39  Blood  pressure check.** / Every 1 to 2 years.  Lipid and cholesterol check.** / Every 5 years beginning at age 20.  Clinical breast exam.** / Every 3 years for women in their 20s and 30s.  Pap test.** / Every 2 years from ages 21 through 29. Every 3 years starting at age 30 through age 65 or 70 with a history of 3 consecutive normal Pap tests.  HPV screening.** / Every 3 years from ages 30 through ages 65 to 70 with a history of 3 consecutive normal Pap tests.  Hepatitis C blood test.** / For any individual with known risks for hepatitis C.  Skin self-exam. / Monthly.  Influenza immunization.** / Every year.  Pneumococcal polysaccharide immunization.** / 1 to 2 doses if you smoke cigarettes or if you have certain chronic medical conditions.  Tetanus, diphtheria, pertussis (Tdap, Td) immunization. / A one-time dose of Tdap vaccine. After that, you need a Td booster dose every 10 years.  HPV immunization. / 3 doses over 6 months, if you are 26 and younger.  Measles, mumps, rubella (MMR) immunization. / You need at least 1 dose of MMR if you were born in 1957 or later. You may also need a second dose.  Meningococcal immunization. / 1 dose if you are age 19 to 21 and a first-year college student living in a residence hall, or have one of several medical conditions, you need to get vaccinated against meningococcal disease. You may also need additional booster doses.  Varicella immunization.** / Consult your caregiver.  Hepatitis A immunization.** / Consult your caregiver. 2 doses, 6 to 18 months apart.  Hepatitis B immunization.** / Consult your caregiver. 3 doses usually over 6 months. Ages 40 to 64  Blood pressure check.** / Every 1 to 2 years.  Lipid and cholesterol check.** / Every 5 years beginning at age 20.  Clinical breast exam.** / Every year after age 40.  Mammogram.** / Every year beginning at age 40   and continuing for as long as you are in good health. Consult with your  caregiver.  Pap test.** / Every 3 years starting at age 30 through age 65 or 70 with a history of 3 consecutive normal Pap tests.  HPV screening.** / Every 3 years from ages 30 through ages 65 to 70 with a history of 3 consecutive normal Pap tests.  Fecal occult blood test (FOBT) of stool. / Every year beginning at age 50 and continuing until age 75. You may not need to do this test if you get a colonoscopy every 10 years.  Flexible sigmoidoscopy or colonoscopy.** / Every 5 years for a flexible sigmoidoscopy or every 10 years for a colonoscopy beginning at age 50 and continuing until age 75.  Hepatitis C blood test.** / For all people born from 1945 through 1965 and any individual with known risks for hepatitis C.  Skin self-exam. / Monthly.  Influenza immunization.** / Every year.  Pneumococcal polysaccharide immunization.** / 1 to 2 doses if you smoke cigarettes or if you have certain chronic medical conditions.  Tetanus, diphtheria, pertussis (Tdap, Td) immunization.** / A one-time dose of Tdap vaccine. After that, you need a Td booster dose every 10 years.  Measles, mumps, rubella (MMR) immunization. / You need at least 1 dose of MMR if you were born in 1957 or later. You may also need a second dose.  Varicella immunization.** / Consult your caregiver.  Meningococcal immunization.** / Consult your caregiver.  Hepatitis A immunization.** / Consult your caregiver. 2 doses, 6 to 18 months apart.  Hepatitis B immunization.** / Consult your caregiver. 3 doses, usually over 6 months. Ages 65 and over  Blood pressure check.** / Every 1 to 2 years.  Lipid and cholesterol check.** / Every 5 years beginning at age 20.  Clinical breast exam.** / Every year after age 40.  Mammogram.** / Every year beginning at age 40 and continuing for as long as you are in good health. Consult with your caregiver.  Pap test.** / Every 3 years starting at age 30 through age 65 or 70 with a 3  consecutive normal Pap tests. Testing can be stopped between 65 and 70 with 3 consecutive normal Pap tests and no abnormal Pap or HPV tests in the past 10 years.  HPV screening.** / Every 3 years from ages 30 through ages 65 or 70 with a history of 3 consecutive normal Pap tests. Testing can be stopped between 65 and 70 with 3 consecutive normal Pap tests and no abnormal Pap or HPV tests in the past 10 years.  Fecal occult blood test (FOBT) of stool. / Every year beginning at age 50 and continuing until age 75. You may not need to do this test if you get a colonoscopy every 10 years.  Flexible sigmoidoscopy or colonoscopy.** / Every 5 years for a flexible sigmoidoscopy or every 10 years for a colonoscopy beginning at age 50 and continuing until age 75.  Hepatitis C blood test.** / For all people born from 1945 through 1965 and any individual with known risks for hepatitis C.  Osteoporosis screening.** / A one-time screening for women ages 65 and over and women at risk for fractures or osteoporosis.  Skin self-exam. / Monthly.  Influenza immunization.** / Every year.  Pneumococcal polysaccharide immunization.** / 1 dose at age 65 (or older) if you have never been vaccinated.  Tetanus, diphtheria, pertussis (Tdap, Td) immunization. / A one-time dose of Tdap vaccine if you are over   65 and have contact with an infant, are a Research scientist (physical sciences), or simply want to be protected from whooping cough. After that, you need a Td booster dose every 10 years.  Varicella immunization.** / Consult your caregiver.  Meningococcal immunization.** / Consult your caregiver.  Hepatitis A immunization.** / Consult your caregiver. 2 doses, 6 to 18 months apart.  Hepatitis B immunization.** / Check with your caregiver. 3 doses, usually over 6 months. ** Family history and personal history of risk and conditions may change your caregiver's recommendations. Document Released: 01/12/2002 Document Revised: 02/08/2012  Document Reviewed: 04/13/2011 [facility] Patient Information 2013 [place], [place]. Migraine Headache A migraine headache is an intense, throbbing pain on one or both sides of your head. A migraine can last for 30 minutes to several hours. CAUSES  The exact cause of a migraine headache is not always known. However, a migraine may be caused when nerves in the brain become irritated and release chemicals that cause inflammation. This causes pain. SYMPTOMS  Pain on one or both sides of your head.  Pulsating or throbbing pain.  Severe pain that prevents daily activities.  Pain that is aggravated by any physical activity.  Nausea, vomiting, or both.  Dizziness.  Pain with exposure to bright lights, loud noises, or activity.  General sensitivity to bright lights, loud noises, or smells. Before you get a migraine, you may get warning signs that a migraine is coming (aura). An aura may include:  Seeing flashing lights.  Seeing bright spots, halos, or zig-zag lines.  Having tunnel vision or blurred vision.  Having feelings of numbness or tingling.  Having trouble talking.  Having muscle weakness. MIGRAINE TRIGGERS  Alcohol.  Smoking.  Stress.  Menstruation.  Aged cheeses.  Foods or drinks that contain nitrates, glutamate, aspartame, or tyramine.  Lack of sleep.  Chocolate.  Caffeine.  Hunger.  Physical exertion.  Fatigue.  Medicines used to treat chest pain (nitroglycerine), birth control pills, estrogen, and some blood pressure medicines. DIAGNOSIS  A migraine headache is often diagnosed based on:  Symptoms.  Physical examination.  A CT scan or MRI of your head. TREATMENT Medicines may be given for pain and nausea. Medicines can also be given to help prevent recurrent migraines.  HOME CARE INSTRUCTIONS  Only take over-the-counter or prescription medicines for pain or discomfort as directed by your caregiver. The use of long-term narcotics is not  recommended.  Lie down in a dark, quiet room when you have a migraine.  Keep a journal to find out what may trigger your migraine headaches. For example, write down:  What you eat and drink.  How much sleep you get.  Any change to your diet or medicines.  Limit alcohol consumption.  Quit smoking if you smoke.  Get 7 to 9 hours of sleep, or as recommended by your caregiver.  Limit stress.  Keep lights dim if bright lights bother you and make your migraines worse. SEEK IMMEDIATE MEDICAL CARE IF:   Your migraine becomes severe.  You have a fever.  You have a stiff neck.  You have vision loss.  You have muscular weakness or loss of muscle control.  You start losing your balance or have trouble walking.  You feel faint or pass out.  You have severe symptoms that are different from your first symptoms. MAKE SURE YOU:   Understand these instructions.  Will watch your condition.  Will get help right away if you are not doing well or get worse. Document Released: 11/16/2005 Document  Revised: 02/08/2012 Document Reviewed: 11/06/2011 [facility] Patient Information 2013 [place], [place].

## 2013-01-29 ENCOUNTER — Encounter: Payer: Self-pay | Admitting: Internal Medicine

## 2013-01-29 NOTE — Assessment & Plan Note (Signed)
I have asked her to have a monitoring ECHO done to see if this is causing any effect on her LA/LV

## 2013-01-29 NOTE — Assessment & Plan Note (Signed)
Her BP is well controlled Today I will check her lytes and renal function 

## 2013-01-29 NOTE — Assessment & Plan Note (Signed)
Pneumovax today 

## 2013-01-29 NOTE — Assessment & Plan Note (Signed)
She will try relpax for the pain  If that does not help then she will try percocet and phenergan

## 2013-01-29 NOTE — Assessment & Plan Note (Signed)
She is not willing to start a statin yet

## 2013-02-06 ENCOUNTER — Other Ambulatory Visit: Payer: Self-pay

## 2013-02-06 DIAGNOSIS — I10 Essential (primary) hypertension: Secondary | ICD-10-CM

## 2013-02-06 MED ORDER — LOSARTAN POTASSIUM 100 MG PO TABS: 100.0000 mg | ORAL_TABLET | Freq: Every day | ORAL | Status: DC

## 2013-02-07 ENCOUNTER — Other Ambulatory Visit (HOSPITAL_COMMUNITY): Payer: Managed Care, Other (non HMO)

## 2013-02-14 ENCOUNTER — Ambulatory Visit: Payer: Self-pay | Admitting: Obstetrics & Gynecology

## 2013-02-15 ENCOUNTER — Telehealth: Payer: Self-pay | Admitting: Obstetrics and Gynecology

## 2013-02-15 NOTE — Telephone Encounter (Signed)
Spoke with pt. Pt. Had appt. Yesterday but due to miscommunication, she did not come in. Appt. resched for Friday 02-17-13 at 2:30 per pt. Request.  AA

## 2013-02-15 NOTE — Telephone Encounter (Signed)
PT SAYS SHE LEFT A MESSAGE FOR NURSE TO CALL HER ON YESTERDAY CONCERNING A LEFT BREAST LUMP.  IT IS SWOLLEN AND PT WOULD LIKE AN APPT.

## 2013-02-17 ENCOUNTER — Encounter: Payer: Self-pay | Admitting: Obstetrics and Gynecology

## 2013-02-17 ENCOUNTER — Ambulatory Visit: Payer: Self-pay | Admitting: Certified Nurse Midwife

## 2013-02-17 ENCOUNTER — Ambulatory Visit: Payer: Self-pay | Admitting: Gynecology

## 2013-02-17 ENCOUNTER — Telehealth: Payer: Self-pay | Admitting: *Deleted

## 2013-02-17 ENCOUNTER — Ambulatory Visit: Payer: Self-pay | Admitting: Obstetrics and Gynecology

## 2013-02-17 ENCOUNTER — Ambulatory Visit (INDEPENDENT_AMBULATORY_CARE_PROVIDER_SITE_OTHER): Payer: Managed Care, Other (non HMO) | Admitting: Obstetrics and Gynecology

## 2013-02-17 VITALS — BP 96/60 | Wt 122.0 lb

## 2013-02-17 DIAGNOSIS — N644 Mastodynia: Secondary | ICD-10-CM

## 2013-02-17 NOTE — Telephone Encounter (Signed)
Patient schedules for diagnostic bilateral mammogram and left breast ultrasound for March 01, 2013 @ 7:50am. sue

## 2013-02-17 NOTE — Progress Notes (Signed)
Subjective:     Patient ID: Danielle Obrien, female   DOB: 1980-12-29, 32 y.o.   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 (Patient still has only the left tube and ovary  Right side presumed to be removed when patient had hernia surgery at  24 months of age.)  Patient reporting soreness of left breast onset three weeks ago.  Location is between 2 and 4 o'clock position.  Patient notes some increased firmness of the left breast.  Occasional sharp pains.  No trauma to breast. Lifts up to 60 pounds at work repeatedly.   No fevers.  No prior breast problems.  No prior ultrasounds, mammograms, or biopsies.    Patient now on Relpax prn migraine headaches.    No family history of breast disease or breast cancer.   Review of Systems   See HPI. Objective:   Physical Exam General:  Patient tearful with examination. Breasts:  Left - tender to palpation at the 2 to 4 o'clock position.  No dominant masses, retractions, nipple discharge, or axillary adenopathy.  Right - No dominant masses, retractions, nipple discharge, or axillary adenopathy.     Assessment:     Left mastalgia.  No signs of cellulitis or trauma.  Does heavy lifting at work.    Plan:     [facility] bilateral [facility]  Vitamin E 400 - 600 IU OTC or Tylenol OTC for pain.

## 2013-02-17 NOTE — Patient Instructions (Signed)
Breast Tenderness Breast tenderness is a common complaint made by women of all ages. It is also called mastalgia or mastodynia, which means breast pain. The condition can range from mild discomfort to severe pain. It has a variety of causes. Your caregiver will find out the likely cause of your breast tenderness by examining your breasts, asking you about symptoms and perhaps ordering some tests. Breast tenderness usually does not mean you have breast cancer. CAUSES  Breast tenderness has many possible causes. They include:  Premenstrual changes. A week to 10 days before your period, your breasts might ache or feel tender.  Other hormonal causes. These include:  When sexual and physical traits mature (puberty).  Pregnancy.  The time right before and the year after menopause (perimenopause).  The day when it has been 12 months since your last period (menopause).  Large breasts.  Infection (also called mastitis).  Birth control pills.  Breastfeeding. Tenderness can occur if the breasts are overfull with milk or if a milk duct is blocked.  Injury.  Fibrocystic breast changes. This is not cancer (benign). It causes painful breasts that feel lumpy.  Fluid-filled sacs (cysts). Often cysts can be drained in your healthcare provider's office.  Fibroadenoma. This is a tumor that is not cancerous.  Medication side effects. Blood pressure drugs and diuretics (which increase urine flow) sometimes cause breast tenderness.  Previous breast surgery, such as a breast reduction.  Breast cancer. Cancer is rarely the reason breasts are tender. In most women, tenderness is caused by something else. DIAGNOSIS  Several methods can be used to find out why your breasts are tender. They include:  Visual inspection of the breasts.  Examination by hand.  Tests, such as:  Mammogram.  Ultrasound.  Biopsy.  Lab test of any fluid coming from the nipple.  Blood tests.  MRI. TREATMENT    Treatment is directed to the cause of the breast tenderness from doing nothing for minor discomfort, wearing a good support bra but also may include:  Taking over-the-counter medicines for pain or discomfort as directed by your caregiver.  Prescription medicine for breast tenderness related to:  Premenstrual.  Fibrocystic.  Puberty.  Pregnancy.  Menopause.  Previous breast surgery.  Large breasts.  Antibiotics for infection.  Birth control pills for fibrocystic and premenstrual changes.  More frequent feedings or pumping of the breasts and warm compresses for breast engorgement when nursing.  Cold and warm compresses and a good support bra for most breast injuries.  Breast cysts are sometimes drained with a needle (aspiration) or removed with minor surgery.  Fibroadenomas are usually removed with minor surgery.  Changing or stopping the medicine when it is responsible for causing the breast tenderness.  When breast cancer is present with or without causing pain, it is usually treated with major surgery (with or without radiation) and chemotherapy. HOME CARE INSTRUCTIONS  Breast tenderness often can be handled at home. You can try:  Getting fitted for a new bra that provides more support, especially during exercise.  Wearing a more supportive or sports bra while sleeping when your breasts are very tender.  If you have a breast injury, using an ice pack for 15 to 20 minutes. Wrap the pack in a towel. Do not put the ice pack directly on your breast.  If your breasts are too full of milk as a result of breastfeeding, try:  Expressing milk either by hand or with a breast pump.  Applying a warm compress for relief.    Taking over-the-counter pain relievers, if this is OK with your caregiver.  Taking medicine that your caregiver prescribes. These might include antibiotics or birth control pills. Over the long term, your breast tenderness might be eased if you:  Cut  down on caffeine.  Reduce the amount of fat in your diet. Also, learn how to do breast examinations at home. This will help you tell when you have an unusual growth or lump that could cause tenderness. And keep a log of the days and times when your breasts are most tender. This will help you and your caregiver find the right solution. SEEK MEDICAL CARE IF:   Any part of your breast is hard, red and hot to the touch. This could be a sign of infection.  Fluid is coming out of your nipples (and you are not breastfeeding). Especially watch for blood or pus.  You have a fever as well as breast tenderness.  You have a new or painful lump in your breast that remains after your period ends.  You have tried to take care of the pain at home, but it has not gone away.  Your breast pain is getting worse. Or, the pain is making it hard to do the things you usually do during your day. Document Released: 10/29/2008 Document Revised: 02/08/2012 Document Reviewed: 10/29/2008 Exit[employer] Patient Information 2013 [employer].  

## 2013-03-01 ENCOUNTER — Other Ambulatory Visit: Payer: Managed Care, Other (non HMO)

## 2013-03-01 ENCOUNTER — Inpatient Hospital Stay: Admission: RE | Admit: 2013-03-01 | Payer: Managed Care, Other (non HMO) | Source: Ambulatory Visit

## 2013-03-14 ENCOUNTER — Telehealth: Payer: Self-pay | Admitting: *Deleted

## 2013-03-14 NOTE — Telephone Encounter (Signed)
Patient NO SHOW for appt. For mammogram. On 03/01/2013. Please advise. Keep on hold or off. Chart in your office. Note is in EPIC for OV. sue

## 2013-03-14 NOTE — Telephone Encounter (Signed)
Please contact patient.  If her symptoms have resolved, she does not need breast imaging.  Her breast exam was normal.

## 2013-03-14 NOTE — Telephone Encounter (Signed)
Patient states she did cancel appt. For mammogram. States she did as Dr. Edward Jolly instructed for her symptoms and the area she was concernd about went away. sue

## 2013-06-30 ENCOUNTER — Telehealth: Payer: Self-pay | Admitting: Obstetrics and Gynecology

## 2013-06-30 NOTE — Telephone Encounter (Signed)
Patient returning phone call . Not sure why you were calling her. Patient is going into work at 11:00 am.

## 2013-07-05 NOTE — Telephone Encounter (Signed)
LMTCB  Pt. Needs to schedule 52month repeat pap( pap due 11/2012) cm

## 2013-07-06 ENCOUNTER — Other Ambulatory Visit: Payer: Self-pay | Admitting: Internal Medicine

## 2013-08-08 NOTE — Telephone Encounter (Signed)
Left a detailed voice message asking patient to call, need to schedule 6 month pap cm.

## 2013-08-15 ENCOUNTER — Encounter: Payer: Self-pay | Admitting: *Deleted

## 2013-08-23 ENCOUNTER — Encounter: Payer: Self-pay | Admitting: *Deleted

## 2013-08-23 ENCOUNTER — Telehealth: Payer: Self-pay | Admitting: *Deleted

## 2013-08-23 NOTE — Telephone Encounter (Signed)
Left again a detailed voice message asking patient to contact Okey Regal @ [facility] Dr. Harlene Salts  (regarding scheduling an repeat pap appt.)

## 2013-08-24 ENCOUNTER — Encounter: Payer: Self-pay | Admitting: *Deleted

## 2013-08-24 ENCOUNTER — Encounter: Payer: Self-pay | Admitting: Obstetrics & Gynecology

## 2013-08-28 ENCOUNTER — Other Ambulatory Visit: Payer: Self-pay | Admitting: Internal Medicine

## 2013-08-30 ENCOUNTER — Encounter: Payer: Self-pay | Admitting: *Deleted

## 2013-10-11 ENCOUNTER — Encounter: Payer: Self-pay | Admitting: *Deleted

## 2013-12-26 ENCOUNTER — Encounter: Payer: Self-pay | Admitting: Internal Medicine

## 2013-12-26 ENCOUNTER — Ambulatory Visit (INDEPENDENT_AMBULATORY_CARE_PROVIDER_SITE_OTHER): Payer: Managed Care, Other (non HMO) | Admitting: Internal Medicine

## 2013-12-26 VITALS — BP 110/70 | HR 97 | Temp 97.2°F | Resp 16 | Ht 60.0 in | Wt 119.0 lb

## 2013-12-26 DIAGNOSIS — J4530 Mild persistent asthma, uncomplicated: Secondary | ICD-10-CM

## 2013-12-26 DIAGNOSIS — Q231 Congenital insufficiency of aortic valve: Secondary | ICD-10-CM

## 2013-12-26 DIAGNOSIS — I10 Essential (primary) hypertension: Secondary | ICD-10-CM

## 2013-12-26 DIAGNOSIS — G43901 Migraine, unspecified, not intractable, with status migrainosus: Secondary | ICD-10-CM

## 2013-12-26 DIAGNOSIS — Q2381 Bicuspid aortic valve: Secondary | ICD-10-CM

## 2013-12-26 DIAGNOSIS — J45909 Unspecified asthma, uncomplicated: Secondary | ICD-10-CM

## 2013-12-26 MED ORDER — FLUTICASONE-SALMETEROL 250-50 MCG/DOSE IN AEPB: 1.0000 | INHALATION_SPRAY | Freq: Two times a day (BID) | RESPIRATORY_TRACT | Status: DC

## 2013-12-26 MED ORDER — ELETRIPTAN HYDROBROMIDE 40 MG PO TABS: 40.0000 mg | ORAL_TABLET | ORAL | Status: DC | PRN

## 2013-12-26 MED ORDER — OXYCODONE-ACETAMINOPHEN 7.5-325 MG PO TABS: 1.0000 | ORAL_TABLET | ORAL | Status: DC | PRN

## 2013-12-26 NOTE — Progress Notes (Signed)
Subjective:    Patient ID: Danielle Obrien, female    DOB: 17-Oct-1981, 33 y.o.   MRN: [id number]  Migraine  This is a recurrent problem. The current episode started more than 1 year ago. The problem occurs intermittently. The problem has been unchanged. The pain is located in the left unilateral region. The pain does not radiate. The pain quality is similar to prior headaches. The quality of the pain is described as throbbing and squeezing. The pain is at a severity of 6/10. The pain is moderate. Associated symptoms include nausea, phonophobia, photophobia and rhinorrhea. Pertinent negatives include no abdominal pain, abnormal behavior, anorexia, back pain, blurred vision, coughing, dizziness, drainage, ear pain, eye pain, eye redness, eye watering, facial sweating, fever, hearing loss, insomnia, loss of balance, muscle aches, neck pain, numbness, scalp tenderness, seizures, sinus pressure, sore throat, swollen glands, tingling, tinnitus, visual change, vomiting, weakness or weight loss. The symptoms are aggravated by emotional stress. Treatments tried: relpax, phenergan, percocet all help some. The treatment provided moderate relief. Her past medical history is significant for hypertension, migraine headaches and migraines in the family. There is no history of cancer, cluster headaches, immunosuppression, obesity, pseudotumor cerebri, recent head traumas, sinus disease or TMJ.      Review of Systems  Constitutional: Negative.  Negative for fever, chills, weight loss, diaphoresis, appetite change and fatigue.  HENT: Positive for rhinorrhea. Negative for ear pain, facial swelling, hearing loss, nosebleeds, postnasal drip, sinus pressure, sore throat, tinnitus and trouble swallowing.   Eyes: Positive for photophobia. Negative for blurred vision, pain and redness.  Respiratory: Negative.  Negative for apnea, cough, choking, chest tightness, shortness of breath, wheezing and stridor.   Cardiovascular:  Negative.  Negative for chest pain, palpitations and leg swelling.  Gastrointestinal: Positive for nausea. Negative for vomiting, abdominal pain, diarrhea, constipation and anorexia.  Endocrine: Negative.   Genitourinary: Negative.   Musculoskeletal: Negative.  Negative for back pain and neck pain.  Skin: Negative.   Allergic/Immunologic: Negative.   Neurological: Positive for headaches. Negative for dizziness, tingling, tremors, seizures, syncope, facial asymmetry, speech difficulty, weakness, light-headedness, numbness and loss of balance.  Hematological: Negative.  Negative for adenopathy. Does not bruise/bleed easily.  Psychiatric/Behavioral: Negative.  The patient does not have insomnia.        Objective:   Physical Exam  Vitals reviewed. Constitutional: She is oriented to person, place, and time. She appears well-developed and well-nourished.  Non-toxic appearance. She does not have a sickly appearance. She does not appear ill. No distress.  HENT:  Head: Normocephalic and atraumatic.  Mouth/Throat: Oropharynx is clear and moist. No oropharyngeal exudate.  Eyes: Conjunctivae and EOM are normal. Pupils are equal, round, and reactive to light. Right eye exhibits no discharge. Left eye exhibits no discharge. No scleral icterus.  Neck: Normal range of motion. Neck supple. No JVD present. No tracheal deviation present. No thyromegaly present.  Cardiovascular: Normal rate, regular rhythm, S1 normal, S2 normal and intact distal pulses.  Exam reveals no gallop, no S3, no S4 and no friction rub.   Murmur heard.  Decrescendo systolic murmur is present with a grade of 2/6   Decrescendo diastolic murmur is present  Pulmonary/Chest: Effort normal and breath sounds normal. No stridor. No respiratory distress. She has no wheezes. She has no rales. She exhibits no tenderness.  Abdominal: Soft. Bowel sounds are normal. She exhibits no distension and no mass. There is no tenderness. There is no rebound  and no guarding.  Musculoskeletal: Normal range  of motion. She exhibits no edema and no tenderness.  Lymphadenopathy:    She has no cervical adenopathy.  Neurological: She is alert and oriented to person, place, and time. She has normal strength. She displays no atrophy, no tremor and normal reflexes. No cranial nerve deficit or sensory deficit. She exhibits normal muscle tone. She displays a negative Romberg sign. She displays no seizure activity. Coordination and gait normal. She displays no Babinski's sign on the right side. She displays no Babinski's sign on the left side.  Reflex Scores:      Tricep reflexes are 1+ on the right side and 1+ on the left side.      Bicep reflexes are 1+ on the right side and 1+ on the left side.      Brachioradialis reflexes are 1+ on the right side and 1+ on the left side.      Patellar reflexes are 1+ on the right side and 1+ on the left side.      Achilles reflexes are 1+ on the right side and 1+ on the left side. Skin: Skin is warm and dry. No rash noted. She is not diaphoretic. No erythema. No pallor.  Psychiatric: She has a normal mood and affect. Her behavior is normal. Judgment and thought content normal.     Lab Results  Component Value Date   WBC 5.9 01/27/2013   HGB 14.9 01/27/2013   HCT 43.7 01/27/2013   PLT 215.0 01/27/2013   GLUCOSE 69* 01/27/2013   CHOL 245* 01/27/2013   TRIG 43.0 01/27/2013   HDL 67.70 01/27/2013   LDLDIRECT 171.0 01/27/2013   ALT 20 01/27/2013   AST 19 01/27/2013   NA 138 01/27/2013   K 4.4 01/27/2013   CL 102 01/27/2013   CREATININE 0.9 01/27/2013   BUN 10 01/27/2013   CO2 29 01/27/2013   TSH 0.35 01/27/2013       Assessment & Plan:

## 2013-12-26 NOTE — Progress Notes (Signed)
Pre visit review using our clinic review tool, if applicable. No additional management support is needed unless otherwise documented below in the visit note. 

## 2013-12-26 NOTE — Patient Instructions (Signed)
Migraine Headache A migraine headache is an intense, throbbing pain on one or both sides of your head. A migraine can last for 30 minutes to several hours. CAUSES  The exact cause of a migraine headache is not always known. However, a migraine may be caused when nerves in the brain become irritated and release chemicals that cause inflammation. This causes pain. Certain things may also trigger migraines, such as:  Alcohol.  Smoking.  Stress.  Menstruation.  Aged cheeses.  Foods or drinks that contain nitrates, glutamate, aspartame, or tyramine.  Lack of sleep.  Chocolate.  Caffeine.  Hunger.  Physical exertion.  Fatigue.  Medicines used to treat chest pain (nitroglycerine), birth control pills, estrogen, and some blood pressure medicines. SIGNS AND SYMPTOMS  Pain on one or both sides of your head.  Pulsating or throbbing pain.  Severe pain that prevents daily activities.  Pain that is aggravated by any physical activity.  Nausea, vomiting, or both.  Dizziness.  Pain with exposure to bright lights, loud noises, or activity.  General sensitivity to bright lights, loud noises, or smells. Before you get a migraine, you may get warning signs that a migraine is coming (aura). An aura may include:  Seeing flashing lights.  Seeing bright spots, halos, or zig-zag lines.  Having tunnel vision or blurred vision.  Having feelings of numbness or tingling.  Having trouble talking.  Having muscle weakness. DIAGNOSIS  A migraine headache is often diagnosed based on:  Symptoms.  Physical exam.  A CT scan or MRI of your head. These imaging tests cannot diagnose migraines, but they can help rule out other causes of headaches. TREATMENT Medicines may be given for pain and nausea. Medicines can also be given to help prevent recurrent migraines.  HOME CARE INSTRUCTIONS  Only take over-the-counter or prescription medicines for pain or discomfort as directed by your  health care provider. The use of long-term narcotics is not recommended.  Lie down in a dark, quiet room when you have a migraine.  Keep a journal to find out what may trigger your migraine headaches. For example, write down:  What you eat and drink.  How much sleep you get.  Any change to your diet or medicines.  Limit alcohol consumption.  Quit smoking if you smoke.  Get 7 9 hours of sleep, or as recommended by your health care provider.  Limit stress.  Keep lights dim if bright lights bother you and make your migraines worse. SEEK IMMEDIATE MEDICAL CARE IF:   Your migraine becomes severe.  You have a fever.  You have a stiff neck.  You have vision loss.  You have muscular weakness or loss of muscle control.  You start losing your balance or have trouble walking.  You feel faint or pass out.  You have severe symptoms that are different from your first symptoms. MAKE SURE YOU:   Understand these instructions.  Will watch your condition.  Will get help right away if you are not doing well or get worse. Document Released: 11/16/2005 Document Revised: 09/06/2013 Document Reviewed: 07/24/2013 [employer] Patient Information 2014 [employer].  

## 2013-12-27 ENCOUNTER — Encounter: Payer: Self-pay | Admitting: Internal Medicine

## 2013-12-27 NOTE — Assessment & Plan Note (Signed)
I had previously ordered an ECHO but she did not have it done I have reordered the ECHO today check the diameters, etc of her aortic valve and to look for complications

## 2013-12-27 NOTE — Assessment & Plan Note (Signed)
Her BP is well controlled 

## 2013-12-27 NOTE — Assessment & Plan Note (Signed)
She is getting relief with relpax, phenergan, and the occasional dose of percocet so will cont that for now There is nothing new or unusual about her HA today

## 2013-12-29 ENCOUNTER — Telehealth: Payer: Self-pay | Admitting: Internal Medicine

## 2013-12-29 NOTE — Telephone Encounter (Signed)
Relevant patient education mailed to patient.  

## 2014-01-01 ENCOUNTER — Other Ambulatory Visit (HOSPITAL_COMMUNITY): Payer: Managed Care, Other (non HMO)

## 2014-01-09 ENCOUNTER — Other Ambulatory Visit: Payer: Self-pay | Admitting: Internal Medicine

## 2014-02-02 ENCOUNTER — Encounter: Payer: Self-pay | Admitting: Nurse Practitioner

## 2014-02-05 ENCOUNTER — Encounter: Payer: Self-pay | Admitting: Nurse Practitioner

## 2014-02-05 ENCOUNTER — Other Ambulatory Visit: Payer: Self-pay | Admitting: Internal Medicine

## 2014-02-05 ENCOUNTER — Ambulatory Visit (INDEPENDENT_AMBULATORY_CARE_PROVIDER_SITE_OTHER): Payer: Managed Care, Other (non HMO) | Admitting: Nurse Practitioner

## 2014-02-05 VITALS — BP 104/70 | HR 80 | Ht 60.25 in | Wt 117.0 lb

## 2014-02-05 DIAGNOSIS — N390 Urinary tract infection, site not specified: Secondary | ICD-10-CM

## 2014-02-05 DIAGNOSIS — A630 Anogenital (venereal) warts: Secondary | ICD-10-CM | POA: Insufficient documentation

## 2014-02-05 DIAGNOSIS — F988 Other specified behavioral and emotional disorders with onset usually occurring in childhood and adolescence: Secondary | ICD-10-CM | POA: Insufficient documentation

## 2014-02-05 DIAGNOSIS — Z01419 Encounter for gynecological examination (general) (routine) without abnormal findings: Secondary | ICD-10-CM

## 2014-02-05 DIAGNOSIS — B977 Papillomavirus as the cause of diseases classified elsewhere: Secondary | ICD-10-CM

## 2014-02-05 DIAGNOSIS — Z0000 Encounter for general adult medical examination without abnormal findings: Secondary | ICD-10-CM

## 2014-02-05 DIAGNOSIS — IMO0002 Reserved for concepts with insufficient information to code with codable children: Secondary | ICD-10-CM | POA: Insufficient documentation

## 2014-02-05 DIAGNOSIS — F319 Bipolar disorder, unspecified: Secondary | ICD-10-CM | POA: Insufficient documentation

## 2014-02-05 LAB — POCT URINALYSIS DIPSTICK
BILIRUBIN UA: NEGATIVE
Glucose, UA: NEGATIVE
Ketones, UA: NEGATIVE
LEUKOCYTES UA: NEGATIVE
NITRITE UA: POSITIVE
Urobilinogen, UA: NEGATIVE
pH, UA: 5

## 2014-02-05 LAB — HEMOGLOBIN, FINGERSTICK: Hemoglobin, fingerstick: 13.7 g/dL (ref 12.0–16.0)

## 2014-02-05 MED ORDER — PHENAZOPYRIDINE HCL 200 MG PO TABS: 200.0000 mg | ORAL_TABLET | Freq: Three times a day (TID) | ORAL | Status: DC | PRN

## 2014-02-05 MED ORDER — CIPROFLOXACIN HCL 500 MG PO TABS: 500.0000 mg | ORAL_TABLET | Freq: Two times a day (BID) | ORAL | Status: DC

## 2014-02-05 NOTE — Progress Notes (Signed)
Patient ID: Danielle Obrien, female   DOB: 1981-09-26, 33 y.o.   MRN: [id number] 33 y.o. G1P0010 Single Caucasian Fe here for annual exam.  Has UTI symptoms for a month with urinary discomfort and hesitation.  Same partner for 7 years.  She ids now sober for over a year.  She has some 'skin tags' that she wants removed from the genital area.  States not too long ago had a viral infection with a high fever and that some of them fell 'off '. 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    No LMP recorded. Patient has had a hysterectomy.          Sexually active: yes  The current method of family planning is status post hysterectomy.    Exercising: yes  Home exercise routine includes walking everyday. Smoker:  yes  Health Maintenance: Pap:  05/31/12, ASCUS, pos HR HPV; Colpo: mild dysplasia, repeat 6 months TDaP:  ? Labs:  HB:  13.7 Urine:  Pos nitrites, trace protein, trace RBC, pH 5.0    reports that she has been smoking Cigarettes.  She has a 6 pack-year smoking history. She has never used smokeless tobacco. She reports that she does not drink alcohol or use illicit drugs.  Past Medical History  Diagnosis Date  . Allergy   . Arthritis     In hips  . Asthma   . Hypertension   . Heart murmur     bicuspid AV  . Anemia   . Alcohol abuse, in remission 2012  . Heart murmur     Past Surgical History  Procedure Laterality Date  . Hip surgery  1981    Hip Reset   . Tympanostomy tube placement  1981/1982  . Hernia repair  1981/1982  . Tonsillectomy and adenoidectomy  1990  . Hip surgery  1990    Plate was reconstructed/ took out growth plate in Right knee  . Hip surgery  1992    Plate removed in Left hip  . Anterior cruciate ligament repair  1993  . Lymph node biopsy  1995  . Nasal septum surgery  2002  . Abdominal hysterectomy  02/06/2009    Robotic total laparoscopic hysterectomy    Current Outpatient Prescriptions   Medication Sig Dispense Refill  . eletriptan (RELPAX) 40 MG tablet Take 1 tablet (40 mg total) by mouth as needed for migraine. may repeat in 2 hours if necessary  10 tablet  11  . Fluticasone-Salmeterol (ADVAIR DISKUS) 250-50 MCG/DOSE AEPB Inhale 1 puff into the lungs 2 (two) times daily.  3 each  0  . Fluticasone-Salmeterol (ADVAIR HFA IN) Inhale into the lungs as needed.      . lamoTRIgine (LAMICTAL) 200 MG tablet TAKE 1 TABLET BY MOUTH TWICE DAILY  180 tablet  1  . lansoprazole (PREVACID) 15 MG capsule Take 15 mg by mouth daily.        Marland Kitchen losartan (COZAAR) 100 MG tablet TAKE 1 TABLET BY MOUTH DAILY.  30 tablet  4  . oxyCODONE-acetaminophen (PERCOCET) 7.5-325 MG per tablet Take 1 tablet by mouth every 4 (four) hours as needed for pain.  30 tablet  0  . promethazine (PHENERGAN) 12.5 MG tablet Take 1 tablet (12.5 mg total) by mouth every 6 (six) hours as needed for nausea.  30 tablet  0  . traZODone (DESYREL) 50 MG tablet Take 1-2 tablets by mouth at bedtime as needed.      Marland Kitchen  ciprofloxacin (CIPRO) 500 MG tablet Take 1 tablet (500 mg total) by mouth 2 (two) times daily.  14 tablet  0  . phenazopyridine (PYRIDIUM) 200 MG tablet Take 1 tablet (200 mg total) by mouth 3 (three) times daily as needed for pain.  20 tablet  0   No current facility-administered medications for this visit.    Family History  Problem Relation Age of Onset  . Cancer Neg Hx   . Hyperlipidemia Mother   . Hypertension Mother   . Thyroid disease Mother   . Hyperlipidemia Father   . Hypertension Father   . Migraines Father   . Hypertension Brother   . Hyperlipidemia Brother   . Thyroid disease Maternal Grandmother   . Thyroid disease Maternal Grandfather     ROS:  Pertinent items are noted in HPI.  Otherwise, a comprehensive ROS was negative.  Exam:   BP 104/70  Pulse 80  Ht 5' 0.25" (1.53 m)  Wt 117 lb (53.071 kg)  BMI 22.67 kg/m2 Height: 5' 0.25" (153 cm)  Ht Readings from Last 3 Encounters:  02/05/14 5'  0.25" (1.53 m)  12/26/13 5' (1.524 m)  10/20/11 5' (1.524 m)    General appearance: alert, cooperative and appears stated age Head: Normocephalic, without obvious abnormality, atraumatic Neck: no adenopathy, supple, symmetrical, trachea midline and thyroid normal to inspection and palpation Lungs: clear to auscultation bilaterally Breasts: normal appearance, no masses or tenderness Heart: regular rate and rhythm Abdomen: soft, non-tender; no masses,  no organomegaly Extremities: extremities normal, atraumatic, no cyanosis or edema Skin: Skin color, texture, turgor normal. No rashes or lesions Lymph nodes: Cervical, supraclavicular, and axillary nodes normal. No abnormal inguinal nodes palpated Neurologic: Grossly normal   Pelvic: External genitalia: multiple lesions that are consistent with condyloma X 26 +              Urethra:  normal appearing urethra with no masses, tenderness or lesions              Bartholin's and Skene's: normal                 Vagina: normal appearing vagina with normal color and discharge, no lesions              Cervix: absent              Pap taken: yes Bimanual Exam:  Uterus:  uterus absent              Adnexa: no mass, fullness, tenderness               Rectovaginal: Confirms               Anus:  normal sphincter tone, no lesions  A:  Well Woman with normal exam  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 Quincy Simmonds - as she might need laser of condyloma instead of treatment with RX.  Will follow with pap  Started on Cipro for UTI along with Pyridium prn for comfort, will follow with urine culture and micro  Counseled on breast self exam, adequate intake of calcium and vitamin D, diet and exercise return annually or prn  An After Visit Summary  was printed and given  to the patient.

## 2014-02-05 NOTE — Patient Instructions (Signed)

## 2014-02-06 LAB — URINALYSIS, MICROSCOPIC ONLY
CASTS: NONE SEEN
Crystals: NONE SEEN

## 2014-02-06 NOTE — Progress Notes (Signed)
Encounter reviewed by Dr. Brook Silva.  

## 2014-02-07 LAB — URINE CULTURE

## 2014-02-08 ENCOUNTER — Telehealth: Payer: Self-pay | Admitting: Nurse Practitioner

## 2014-02-08 LAB — IPS PAP TEST WITH HPV

## 2014-02-08 NOTE — Telephone Encounter (Signed)
I have tried to call patient on cell phone with no answer / voice mail and left a message on home number to cal back.  Trying to notify her of abnormal pap and need to reschedule a repeat pap.  She has met Dr. Quincy Simmonds and would prefer her to do the procedure.

## 2014-02-12 ENCOUNTER — Telehealth: Payer: Self-pay | Admitting: Nurse Practitioner

## 2014-02-12 NOTE — Telephone Encounter (Signed)
See p.c. from 02/08/14 - I have been trying to notify her of abnormal pap and needs a repeat colpo biopsy.  She knows Dr. Quincy Simmonds and I felt she would do well with her.  I thought I had already sent this to you.

## 2014-02-12 NOTE — Telephone Encounter (Signed)
Patient given message. She is scheduled for TOC in two weeks.  Patty can you please review pap smear.Patient is requesting results.  Thank you

## 2014-02-12 NOTE — Telephone Encounter (Signed)
Patient said she was returning stephanies call i dont see a call from stephanie open.

## 2014-02-12 NOTE — Telephone Encounter (Signed)
Message copied by Michele Mcalpine on Mon Feb 12, 2014  3:35 PM ------      Message from: Kem Boroughs R      Created: Wed Feb 07, 2014 11:39 PM       Let patient know that she is on the correct antibiotic. Needs TOC in 2 weeks. ------

## 2014-02-13 NOTE — Telephone Encounter (Signed)
Danielle Obrien,  Patient may take medication supplied by the office such as Ativan or Valium. Please inform me what is available. I will need to consent the patient before she takes the medication.

## 2014-02-13 NOTE — Telephone Encounter (Signed)
Spoke with pt to notify of abnormal Pap with +HPV and recommendation for colpo with BS. Questions answered. Scheduled appt 03-05-14 at 2 pm which works with pt's work schedule. Pt already had a urine TOC scheduled that day. Advised pt she can give urine sample while here for colpo, and 8:45 TOC appt cancelled.  Pt advised to take ibuprofen an hour before the appt and she says she cannot take ibuprofen. Pt will try Aleve with food and fluids. Pt would like to take something to calm her before the procedure if possible. Advised she can bring a driver and get here 30 minutes early to sign her consent, and I would send a message to Dr. Quincy Simmonds about this. Pt does not have periods anymore as she has had a partial hysterectomy.  Dr. Quincy Simmonds, is it OK for pt to have med to calm her before colpo? Can she take med supplied by office, or will she need a Rx?

## 2014-02-15 NOTE — Telephone Encounter (Signed)
Dr. Quincy Simmonds we have Ativan 1 mg. Thank you!

## 2014-02-19 NOTE — Telephone Encounter (Addendum)
Spoke with pt to let her know she can take an office supplied med before her colpo. No Rx needed. Pt to arrive 30-45 min early to sign consent and bring a driver. Will send to [facility] for precert. Pt says to call her work number [phone] and OK to leave a VM about OOP cost.

## 2014-02-19 NOTE — Telephone Encounter (Signed)
Pre-cert complete/PR: $29 copay

## 2014-02-19 NOTE — Telephone Encounter (Signed)
Please enter the order.  Thanks, Gabriel Cirri

## 2014-02-20 ENCOUNTER — Telehealth: Payer: Self-pay

## 2014-02-20 ENCOUNTER — Ambulatory Visit (HOSPITAL_COMMUNITY): Payer: Managed Care, Other (non HMO) | Attending: Cardiology | Admitting: Radiology

## 2014-02-20 ENCOUNTER — Other Ambulatory Visit: Payer: Self-pay | Admitting: Internal Medicine

## 2014-02-20 DIAGNOSIS — I350 Nonrheumatic aortic (valve) stenosis: Secondary | ICD-10-CM

## 2014-02-20 DIAGNOSIS — Q230 Congenital stenosis of aortic valve: Secondary | ICD-10-CM | POA: Insufficient documentation

## 2014-02-20 DIAGNOSIS — R011 Cardiac murmur, unspecified: Secondary | ICD-10-CM

## 2014-02-20 DIAGNOSIS — Q231 Congenital insufficiency of aortic valve: Secondary | ICD-10-CM

## 2014-02-20 DIAGNOSIS — Q2381 Bicuspid aortic valve: Secondary | ICD-10-CM

## 2014-02-20 HISTORY — DX: Congenital stenosis of aortic valve: Q23.0

## 2014-02-20 NOTE — Progress Notes (Signed)
Echocardiogram Performed. 

## 2014-02-21 NOTE — Telephone Encounter (Signed)
Closed

## 2014-03-05 ENCOUNTER — Encounter: Payer: Self-pay | Admitting: Obstetrics and Gynecology

## 2014-03-05 ENCOUNTER — Ambulatory Visit (INDEPENDENT_AMBULATORY_CARE_PROVIDER_SITE_OTHER): Payer: Managed Care, Other (non HMO) | Admitting: Obstetrics and Gynecology

## 2014-03-05 ENCOUNTER — Ambulatory Visit: Payer: Managed Care, Other (non HMO)

## 2014-03-05 VITALS — BP 100/62 | HR 70 | Ht 60.25 in | Wt 115.6 lb

## 2014-03-05 DIAGNOSIS — R8789 Other abnormal findings in specimens from female genital organs: Secondary | ICD-10-CM

## 2014-03-05 DIAGNOSIS — B977 Papillomavirus as the cause of diseases classified elsewhere: Secondary | ICD-10-CM

## 2014-03-05 DIAGNOSIS — N390 Urinary tract infection, site not specified: Secondary | ICD-10-CM

## 2014-03-05 DIAGNOSIS — R87618 Other abnormal cytological findings on specimens from cervix uteri: Secondary | ICD-10-CM

## 2014-03-05 NOTE — Progress Notes (Addendum)
Subjective:     Patient ID: Danielle Obrien, female   DOB: 07/20/1981, 33 y.o.   MRN: [id number]  HPI  Pap on 02/05/14 showed LGSIL and positive HR HPV.  Patient has signed consent form and then took Ativan 1 mg po which we provided for her from the office supply.   Status post robotic total laparoscopic hysterectomy in March 2010.  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  Pap July 2013 - ASCUS and positive HR HPV.  Colposcopy July 2013 -  biopsy LGSIL. No vaginal treatment.   Smoker.  Working on quitting.   Patient very remorseful about HPV infection.    Review of Systems     Objective:   Physical Exam  Genitourinary:          Colposcopy.  Consent signed and patient then received Ativan 1 mg po. Addendum created to include: Lot [id number], Exp. Sept. 2015 Speculum placed in vagina.  Acetic acid placed in vagina.  Scattered raised condylomatous lesions noted of the vaginal walls. Two biopsies taken of the cuff and sent together to pathology.  Monsel's placed.  Minimal EBL.  Vulva soaked in acetic acid gauze pads. Multiple moderately brown staining condyloma-like verrucous lesions noted of the bilateral labia and medial thighs.  Sterile prep of the right inferior labia majora with betadine lot # [id number], Exp. 12/2015. Local 1% lidocaine 1 cc injected, lot # [id number], Exp. 06/30/14. Fine tipped scissors used to remove lesion and send to pathology. AgNO3 placed.  Minimal EBL.  No complications.      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 Status post robotic hysterectomy.  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  Tobacco use.     Plan:     Follow up biopsies.  Patient may be candidate for laser therapy of the lesions for internal and external treatment.  Counseled on tobacco cessation.      An additional 15 minutes of face to face time spent in direct counseling of the patient regarding HPV and  abnormal paps in addition to smoking cessation and healthy lifestyle.   After visit summary to patient.

## 2014-03-05 NOTE — Patient Instructions (Signed)
Colposcopy, Care After Refer to this sheet in the next few weeks. These instructions provide you with information on caring for yourself after your procedure. Your health care provider may also give you more specific instructions. Your treatment has been planned according to current medical practices, but problems sometimes occur. Call your health care provider if you have any problems or questions after your procedure. WHAT TO EXPECT AFTER THE PROCEDURE  After your procedure, it is typical to have the following:  Cramping. This often goes away in a few minutes.  Soreness. This may last for 2 days.  Lightheadedness. Lie down for a few minutes if this occurs. You may also have some bleeding or dark discharge for a few days. You may need to wear a sanitary pad during this time. HOME CARE INSTRUCTIONS  Avoid sex, douching, and using tampons for 3 days or as directed by your health care provider.  Only take over-the-counter or prescription medicines as directed by your health care provider. Do not take aspirin because it can cause bleeding.  Continue to take birth control pills if you are on them.  Not all test results are available during your visit. If your test results are not back during the visit, make an appointment with your health care provider to find out the results. Do not assume everything is normal if you have not heard from your health care provider or the medical facility. It is important for you to follow up on all of your test results.  Follow your health care provider's advice regarding activity, follow-up visits, and follow-up Pap tests. SEEK MEDICAL CARE IF:  You develop a rash.  You have problems with your medicine. SEEK IMMEDIATE MEDICAL CARE IF:  You are bleeding heavily or are passing blood clots.  You have a fever.  You have abnormal vaginal discharge.  You are having cramps that do not go away after taking your pain medicine.  You feel lightheaded, dizzy, or  faint.  You have stomach pain. Document Released: 09/06/2013 Document Reviewed: 06/15/2013 [facility] Patient Information 2014 [place], [place].  Human Papillomavirus Human papillomavirus (HPV) is the most common sexually transmitted infection (STI) and is highly contagious. HPV infections cause genital warts and cancers to the outlet of the womb (cervix), birth canal (vagina), opening of the birth canal (vulva), and anus. There are over 100 types of HPV. Four types of HPV are responsible for causing 70% of all cervical cancers. Ninety percent of anal cancers and genital warts are caused by HPV. Unless you have wart-like lesions in the throat or genital warts that you can see or feel, HPV usually does not cause symptoms. Therefore, people can be infected for long periods and pass it on to others without knowing it. HPV in pregnancy usually does not cause a problem for the mother or baby. If the mother has genital warts, the baby rarely gets infected. When the HPV infection is found to be pre-cancerous on the cervix, vagina, or vulva, the mother will be followed closely during the pregnancy. Any needed treatment will be done after the baby is born. CAUSES  Having unprotected sex. HPV can be spread by oral, vaginal, or anal sexual activity. Having several sex partners. Having a sex partner who has other sex partners. Having or having had another sexually transmitted infection. SYMPTOMS  More than 90% of people carrying HPV cannot tell anything is wrong. Wart-like lesions in the throat (from having oral sex). Warts in the infected skin or mucous membranes. Genital warts  may itch, burn, or bleed. Genital warts may be painful or bleed during sexual intercourse. DIAGNOSIS  Genital warts are easily seen with the naked eye. Currently, there is no FDA-approved test to detect HPV in males. In females, a Pap test can show cells which are infected with HPV. In females, a scope can be used to view the cervix  (colposcopy). A colposcopy can be performed if the pelvic exam or Pap test is abnormal. In females, a sample of tissue may be removed (biopsy) during the colposcopy. TREATMENT  Treatment of genital warts can include: Podophyllin lotion or gel. Bichloroacetic acid (BCA) or trichloroacetic acid (TCA). Podofilox solution or gel. Imiquimod cream. Interferon injections. Use of a probe to apply extreme cold (cryotherapy). Application of an intense beam of light (laser treatment). Use of a probe to apply extreme heat (electrocautery). Surgery. HPV of the cervix, vagina, or vulva can be treated with: Cryotherapy. Laser treatment. Electrocautery. Surgery. Your caregiver will follow you closely after you are treated. This is because the HPV can come back and may need treatment again. HOME CARE INSTRUCTIONS  Follow your caregiver's instructions regarding medications, Pap tests, and follow-up exams. Do not touch or scratch the warts. Do not treat genital warts with medication used for treating hand warts. Tell your sex partner about your infection because he or she may also need treatment. Do not have sex while you are being treated. After treatment, use condoms during sex to prevent future infections. Have only 1 sex partner. Have a sex partner who does not have other sex partners. Use over-the-counter creams for itching or irritation as directed by your caregiver. Use over-the-counter or prescription medicines for pain, discomfort, or fever as directed by your caregiver. Do not douche or use tampons during treatment of HPV. PREVENTION  Talk to your caregiver about getting the HPV vaccines. These vaccines prevent some HPV infections and cancers. It is recommended that the vaccine be given to males and females between the age of 68 and 58 years old. It will not work if you already have HPV and it is not recommended for pregnant women. The vaccines are not recommended for pregnant women. Call your  caregiver if you think you are pregnant and have the HPV. A PAP test is done to screen for cervical cancer. The first PAP test should be done at age 33. Between ages 21 and 25, PAP tests are repeated every 2 years. Beginning at age 35, you are advised to have a PAP test every 3 years as long as your past 3 PAP tests have been normal. Some women have medical problems that increase the chance of getting cervical cancer. Talk to your caregiver about these problems. It is especially important to talk to your caregiver if a new problem develops soon after your last PAP test. In these cases, your caregiver may recommend more frequent screening and Pap tests. The above recommendations are the same for women who have or have not gotten the vaccine for HPV (Human Papillomavirus). If you had a hysterectomy for a problem that was not a cancer or a condition that could lead to cancer, then you no longer need Pap tests. However, even if you no longer need a PAP test, a regular exam is a good idea to make sure no other problems are starting.  If you are between ages 60 and 50, and you have had normal Pap tests going back 10 years, you no longer need Pap tests. However, even if you no longer  need a PAP test, a regular exam is a good idea to make sure no other problems are starting. If you have had past treatment for cervical cancer or a condition that could lead to cancer, you need Pap tests and screening for cancer for at least 20 years after your treatment. If Pap tests have been discontinued, risk factors (such as a new sexual partner)need to be re-assessed to determine if screening should be resumed. Some women may need screenings more often if they are at high risk for cervical cancer. SEEK MEDICAL CARE IF:  The treated skin becomes red, swollen or painful. You have an oral temperature above 102 F (38.9 C). You feel generally ill. You feel lumps or pimple-like projections in and around your genital  area. You develop bleeding of the vagina or the treatment area. You develop painful sexual intercourse. Document Released: 02/06/2004 Document Revised: 02/08/2012 Document Reviewed: 01/26/2008 [facility] Patient Information 2014 [place].

## 2014-03-06 LAB — URINE CULTURE
COLONY COUNT: NO GROWTH
ORGANISM ID, BACTERIA: NO GROWTH

## 2014-03-07 ENCOUNTER — Telehealth: Payer: Self-pay

## 2014-03-07 NOTE — Telephone Encounter (Signed)
Message copied by Lowella Fairy on Wed Mar 07, 2014  2:01 PM ------      Message from: [place], [place]: Wed Mar 07, 2014  8:26 AM       Please report negative urine culture to patient.      Colposcopic biopsies are still pending. ------

## 2014-03-07 NOTE — Telephone Encounter (Signed)
LMOVM to call for lab results. 

## 2014-03-08 LAB — IPS OTHER TISSUE BIOPSY

## 2014-03-12 ENCOUNTER — Telehealth: Payer: Self-pay | Admitting: *Deleted

## 2014-03-12 NOTE — Telephone Encounter (Signed)
Call to patient's cell number [phone], VM confirms number, LMTCB, no emergency. Call to home number, VM not set up, no message left.

## 2014-03-12 NOTE — Telephone Encounter (Signed)
Message copied by Jaymes Graff on Mon Mar 12, 2014  5:21 PM ------      Message from: [place], [place]: Mon Mar 12, 2014  2:01 PM       Gay Filler,            Please contact patient with results.            Vaginal and vulvar biopsies confirm condyloma.      There was no sign of cancer and negative for high grade dysplasia.            Patient had multiple vaginal and vulvar lesions.             I would like to discuss options for treatment for her.            Thanks.            Cc- Marisa Sprinkles ------

## 2014-03-14 NOTE — Telephone Encounter (Signed)
Patient notified of results and Dr Elza Rafter recommendation for consult to discuss treatment options.  Patient agreeable; needs early am appointment due to work schedule.  Consult 03-28-14 at 0730. Declined earlier appointment date.    Routing to provider for final review. Patient agreeable to disposition. Will close encounter  Dr Quincy Simmonds, just wanted you to know that I spoke to patient.

## 2014-03-16 ENCOUNTER — Encounter: Payer: Self-pay | Admitting: Cardiology

## 2014-03-16 ENCOUNTER — Ambulatory Visit (INDEPENDENT_AMBULATORY_CARE_PROVIDER_SITE_OTHER): Payer: Managed Care, Other (non HMO) | Admitting: Cardiology

## 2014-03-16 VITALS — BP 114/81 | HR 80 | Ht 60.25 in | Wt 119.8 lb

## 2014-03-16 DIAGNOSIS — I10 Essential (primary) hypertension: Secondary | ICD-10-CM

## 2014-03-16 DIAGNOSIS — Q231 Congenital insufficiency of aortic valve: Secondary | ICD-10-CM

## 2014-03-16 DIAGNOSIS — I359 Nonrheumatic aortic valve disorder, unspecified: Secondary | ICD-10-CM

## 2014-03-16 DIAGNOSIS — I350 Nonrheumatic aortic (valve) stenosis: Secondary | ICD-10-CM

## 2014-03-16 NOTE — Patient Instructions (Signed)
Your physician recommends that you continue on your current medications as directed. Please refer to the Current Medication list given to you today.  Your physician has requested that you have an exercise tolerance test. For further information please visit [email or web address]. Please also follow instruction sheet, as given. (Dr Radford Pax wants this on her schedule within the next month)  Your physician has requested that you have an echocardiogram. Echocardiography is a painless test that uses sound waves to create images of your heart. It provides your doctor with information about the size and shape of your heart and how well your heart's chambers and valves are working. This procedure takes approximately one hour. There are no restrictions for this procedure. ( schedule 6 months out for pt.)  Your physician wants you to follow-up in: 6 months with Dr Mallie Snooks will receive a reminder letter in the mail two months in advance. If you don't receive a letter, please call our office to schedule the follow-up appointment.

## 2014-03-16 NOTE — Progress Notes (Signed)
[address], [place], [place]  [address] Phone: [phone] Fax:  [phone]  Date:  03/16/2014   ID:  JULICIA KRIEGER, DOB 1981/04/29, MRN [id number]  PCP:  [name], MD  Cardiologist:  Fransico Him, MD     History of Present Illness: Danielle Obrien is a 33 y.o. female with a history of HTN, bicuspid AV with heart murmur and asthma who presents today for evaluation of recent echo which showed severe AS with normal LVF.  She denies any chest pain, SOB, DOE, LE edema, dizziness, palpitations or syncope. She has had occasional chest pain over her chest that she describes as a pressure like someone sitting on her chest and occurs once weekly that she thinks is more related to allergies and asthma and this has been occurring for years.  She has chronic SOB from her asthma which is stable.  Occasionally she will notice her heart fluttering but it is very infrequent and has occurred for years.  She denies any dizziness or syncope.  She walks a mile 3 days a week without any problems.   Wt Readings from Last 3 Encounters:  03/16/14 119 lb 12.8 oz (54.341 kg)  03/05/14 115 lb 9.6 oz (52.436 kg)  02/05/14 117 lb (53.071 kg)     Past Medical History  Diagnosis Date  . Allergy   . Arthritis     In hips  . Asthma   . Hypertension   . Heart murmur     bicuspid AV  . Anemia   . Alcohol abuse, in remission 2012  . Heart murmur     Current Outpatient Prescriptions  Medication Sig Dispense Refill  . atomoxetine (STRATTERA) 80 MG capsule Take 80 mg by mouth daily.      Marland Kitchen eletriptan (RELPAX) 40 MG tablet Take 1 tablet (40 mg total) by mouth as needed for migraine. may repeat in 2 hours if necessary  10 tablet  11  . Fluticasone-Salmeterol (ADVAIR DISKUS) 250-50 MCG/DOSE AEPB Inhale 1 puff into the lungs 2 (two) times daily.  3 each  0  . Fluticasone-Salmeterol (ADVAIR HFA IN) Inhale into the lungs as needed.      . lamoTRIgine (LAMICTAL) 200 MG tablet TAKE 1 TABLET BY  MOUTH TWICE DAILY  180 tablet  1  . lansoprazole (PREVACID) 15 MG capsule Take 15 mg by mouth daily.        Marland Kitchen losartan (COZAAR) 100 MG tablet TAKE 1 TABLET BY MOUTH DAILY  30 tablet  3  . phenazopyridine (PYRIDIUM) 200 MG tablet Take 200 mg by mouth as needed.       . traZODone (DESYREL) 50 MG tablet Take 1-2 tablets by mouth at bedtime as needed.       No current facility-administered medications for this visit.    Allergies:    Allergies  Allergen Reactions  . Demerol Nausea And Vomiting  . Morphine And Related Nausea And Vomiting  . Codeine Itching    Social History:  The patient  reports that she has been smoking Cigarettes.  She has a 6 pack-year smoking history. She has never used smokeless tobacco. She reports that she does not drink alcohol or use illicit drugs.   Family History:  The patient's family history includes Hyperlipidemia in her brother, father, and mother; Hypertension in her brother, father, and mother; Migraines in her father; Thyroid disease in her maternal grandfather, maternal grandmother, and mother. There is no history of Cancer.   ROS:  Please see the history of present illness.      All other systems reviewed and negative.   PHYSICAL EXAM: VS:  BP 114/81  Pulse 80  Ht 5' 0.25" (1.53 m)  Wt 119 lb 12.8 oz (54.341 kg)  BMI 23.21 kg/m2 Well nourished, well developed, in no acute distress HEENT: normal Neck: no JVD Cardiac:  normal S1, S2; RRR; 2/6 late peaking systolic murmur at RUSB radiating to LLSB and into carotid arteries bilaterally Lungs:  clear to auscultation bilaterally, no wheezing, rhonchi or rales Abd: soft, nontender, no hepatomegaly Ext: no edema Skin: warm and dry Neuro:  CNs 2-12 intact, no focal abnormalities noted  EKG:  NSR with LAE and otherwise normal intervals with no ST changes     ASSESSMENT AND PLAN:  1. Bicuspid AV with heart murmur and 2D echo recently showing severe AS.  She has some vague symptoms of chest pain and SOB  but these have been intermittent for years and are due to asthma and allergies.  She has no evidence on echo of pressure overload such as LVH or LV dilatation.  Her LVF is normal.  I have recommended an ETT to assess exercise intolerance and see if she has any chest pain or SOB with exercise.  If her ETT is ok then we will proceed with 2D echo in 6 months to reassess.  She has been instructed to call if she develops chest pain or SOB different from what she has with her asthma or if she develops LE edema, dizziness or syncope. 2. Severe AS 3. HTN - controlled - continue Losartan  Followup with me in 6 months  Signed, Fransico Him, MD 03/16/2014 8:56 AM

## 2014-03-21 NOTE — Telephone Encounter (Signed)
Called patient and per DPR, left detailed message on [phone] stating "urine lab" normal.  Keep appt. With Dr. Quincy Simmonds on 03-28-14.

## 2014-03-28 ENCOUNTER — Ambulatory Visit (INDEPENDENT_AMBULATORY_CARE_PROVIDER_SITE_OTHER): Payer: Managed Care, Other (non HMO) | Admitting: Obstetrics and Gynecology

## 2014-03-28 ENCOUNTER — Encounter: Payer: Self-pay | Admitting: Obstetrics and Gynecology

## 2014-03-28 VITALS — BP 120/82 | HR 84 | Ht 60.25 in | Wt 115.8 lb

## 2014-03-28 DIAGNOSIS — Z113 Encounter for screening for infections with a predominantly sexual mode of transmission: Secondary | ICD-10-CM

## 2014-03-28 DIAGNOSIS — F17200 Nicotine dependence, unspecified, uncomplicated: Secondary | ICD-10-CM

## 2014-03-28 DIAGNOSIS — Z720 Tobacco use: Secondary | ICD-10-CM

## 2014-03-28 DIAGNOSIS — A630 Anogenital (venereal) warts: Secondary | ICD-10-CM

## 2014-03-28 NOTE — Progress Notes (Signed)
Patient ID: Danielle Obrien, female   DOB: November 15, 1981, 33 y.o.   MRN: [id number] GYNECOLOGY  VISIT   HPI: 33 y.o.   Single  Caucasian  female   [id number] with No LMP recorded. Patient has had a hysterectomy.   here to discuss results from Colposcopy procedure and vulvar biopsies.  Vaginal biopsy and vulvar showed condyloma acuminata.   Patient is a smoker.   Patient had an ECHO of the heart showing calcifications.  Has aortic stenosis and bicuspid AV, HTN, heart murmur, and asthma.  Patient has severe AS and normal LVF.   GYNECOLOGIC HISTORY: No LMP recorded. Patient has had a hysterectomy. Contraception: Hysterectomy Menopausal hormone therapy: n/a        OB History   Grav Para Term Preterm Abortions TAB SAB Ect Mult Living   1    1     0         Patient Active Problem List   Diagnosis Date Noted  . Aortic stenosis, severe 02/20/2014  . ADD (attention deficit disorder) 02/05/2014  . Condyloma 02/05/2014  . Bipolar disorder, unspecified 02/05/2014  . Abnormal Pap smear of vagina and vaginal HPV 02/05/2014  . Bicuspid aortic valve 01/27/2013  . Migraine with status migrainosus 01/27/2013  . Dyslipidemia (high LDL; low HDL) 01/27/2013  . Routine general medical examination at a health care facility 10/20/2011  . Mild persistent asthma 04/21/2011  . Essential hypertension, benign 04/21/2011    Past Medical History  Diagnosis Date  . Allergy   . Arthritis     In hips  . Asthma   . Hypertension   . Heart murmur     bicuspid AV  . Anemia   . Alcohol abuse, in remission 2012  . Heart murmur   . Abnormal Pap smear of cervix     --recurrent ascus w/Pos. HR HPV  . Bipolar disorder   . ADD (attention deficit disorder)     Past Surgical History  Procedure Laterality Date  . Hip surgery  1981    Hip Reset   . Tympanostomy tube placement  1981/1982  . Hernia repair  1981/1982  . Tonsillectomy and adenoidectomy  1990  . Hip surgery  1990    Plate was reconstructed/ took  out growth plate in Right knee  . Hip surgery  1992    Plate removed in Left hip  . Anterior cruciate ligament repair  1993  . Lymph node biopsy  1995  . Nasal septum surgery  2002  . Abdominal hysterectomy  02/06/2009    Robotic total laparoscopic hysterectomy  . 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 Colposcopy  10/2008    CIN I & II  . Colposcopy  07/2000    Neg. ECC  . Colposcopy  06/2001    CIN I  . Colposcopy  08/2004    ECC--atypia    Current Outpatient Prescriptions  Medication Sig Dispense Refill  . atomoxetine (STRATTERA) 80 MG capsule Take 80 mg by mouth daily.      Marland Kitchen eletriptan (RELPAX) 40 MG tablet Take 1 tablet (40 mg total) by mouth as needed for migraine. may repeat in 2 hours if necessary  10 tablet  11  . Fluticasone-Salmeterol (ADVAIR DISKUS) 250-50 MCG/DOSE AEPB Inhale 1 puff into the lungs 2 (two) times daily.  3 each  0  . Fluticasone-Salmeterol (ADVAIR HFA IN) Inhale into the lungs as needed.      Marland Kitchen  lamoTRIgine (LAMICTAL) 200 MG tablet TAKE 1 TABLET BY MOUTH TWICE DAILY  180 tablet  1  . lansoprazole (PREVACID) 15 MG capsule Take 15 mg by mouth daily.        Marland Kitchen losartan (COZAAR) 100 MG tablet TAKE 1 TABLET BY MOUTH DAILY  30 tablet  3  . phenazopyridine (PYRIDIUM) 200 MG tablet Take 200 mg by mouth as needed.       . traZODone (DESYREL) 50 MG tablet Take 1-2 tablets by mouth at bedtime as needed.       No current facility-administered medications for this visit.     ALLERGIES: Demerol; Morphine and related; and Codeine  Family History  Problem Relation Age of Onset  . Cancer Neg Hx   . Hyperlipidemia Mother   . Hypertension Mother   . Thyroid disease Mother   . Hyperlipidemia Father   . Hypertension Father   . Migraines Father   . Hypertension Brother   . Hyperlipidemia Brother   . Thyroid disease Maternal Grandmother   . Thyroid disease Maternal Grandfather     History   Social History  .  Marital Status: Single    Spouse Name: N/A    Number of Children: N/A  . Years of Education: N/A   Occupational History  . Not on file.   Social History Main Topics  . Smoking status: Current Every Day Smoker -- 0.50 packs/day for 12 years    Types: Cigarettes  . Smokeless tobacco: Never Used  . Alcohol Use: No  . Drug Use: No  . Sexual Activity: Yes    Partners: Male    Birth Control/ Protection: Surgical     Comment: Hysterectomy   Other Topics Concern  . Not on file   Social History Narrative  . No narrative on file    ROS:  Pertinent items are noted in HPI.  PHYSICAL EXAMINATION:    BP 120/82  Pulse 84  Ht 5' 0.25" (1.53 m)  Wt 115 lb 12.8 oz (52.527 kg)  BMI 22.44 kg/m2     General appearance: alert, cooperative and appears stated age   ASSESSMENT  Extensive condyloma acuminata. Smoker. History of cervical dysplasia. Aortic stenosis.   PLAN  STD testing - HIV, RPR, Hep C, Hep B, GC/CT. I discussed smoking cessation.  I discussed laser ablation of condyloma verses potential interferon therapy. Patient will have consultation with her cardiologist to complete her evaluation of her aortic stenosis.  This will help to decide if she has good fitness for surgery or will pursue the interferon or cyclic cryotherapy of the vulva.  Patient will return after completes cardiac evaluation in May.    An After Visit Summary was printed and given to the patient.  __30____ minutes face to face time of which over 50% was spent in counseling.

## 2014-03-28 NOTE — Patient Instructions (Signed)
Genital Warts Genital warts are a sexually transmitted infection. They may appear as small bumps on the tissues of the genital area. CAUSES  Genital warts are caused by a virus called human papillomavirus (HPV). HPV is the most common sexually transmitted disease (STD) and infection of the sex organs. This infection is spread by having unprotected sex with an infected person. It can be spread by vaginal, anal, and oral sex. Many people do not know they are infected. They may be infected for years without problems. However, even if they do not have problems, they can unknowingly pass the infection to their sexual partners. SYMPTOMS   Itching and irritation in the genital area.  Warts that bleed.  Painful sexual intercourse. DIAGNOSIS  Warts are usually recognized with the naked eye on the vagina, vulva, perineum, anus, and rectum. Certain tests can also diagnose genital warts, such as:  A Pap test.  A tissue sample (biopsy) exam.  Colposcopy. A magnifying tool is used to examine the vagina and cervix. The HPV cells will change color when certain solutions are used. TREATMENT  Warts can be removed by:  Applying certain chemicals, such as cantharidin or podophyllin.  Liquid nitrogen freezing (cryotherapy).  Immunotherapy with candida or trichophyton injections.  Laser treatment.  Burning with an electrified probe (electrocautery).  Interferon injections.  Surgery. PREVENTION  HPV vaccination can help prevent HPV infections that cause genital warts and that cause cancer of the cervix. It is recommended that the vaccination be given to people between the ages 9 to 26 years old. The vaccine might not work as well or might not work at all if you already have HPV. It should not be given to pregnant women. HOME CARE INSTRUCTIONS   It is important to follow your caregiver's instructions. The warts will not go away without treatment. Repeat treatments are often needed to get rid of warts.  Even after it appears that the warts are gone, the normal tissue underneath often remains infected.  Do not try to treat genital warts with medicine used to treat hand warts. This type of medicine is strong and can burn the skin in the genital area, causing more damage.  Tell your past and current sexual partner(s) that you have genital warts. They may be infected also and need treatment.  Avoid sexual contact while being treated.  Do not touch or scratch the warts. The infection may spread to other parts of your body.  Women with genital warts should have a cervical cancer check (Pap test) at least once a year. This type of cancer is slow-growing and can be cured if found early. Chances of developing cervical cancer are increased with HPV.  Inform your obstetrician about your warts in the event of pregnancy. This virus can be passed to the baby's respiratory tract. Discuss this with your caregiver.  Use a condom during sexual intercourse. Following treatment, the use of condoms will help prevent reinfection.  Ask your caregiver about using over-the-counter anti-itch creams. SEEK MEDICAL CARE IF:   Your treated skin becomes red, swollen, or painful.  You have a fever.  You feel generally ill.  You feel little lumps in and around your genital area.  You are bleeding or have painful sexual intercourse. MAKE SURE YOU:   Understand these instructions.  Will watch your condition.  Will get help right away if you are not doing well or get worse. Document Released: 11/13/2000 Document Revised: 02/08/2012 Document Reviewed: 05/25/2011 ExitCare Patient Information 2014 [employer].    Interferon Alfa-2a injection What is this medicine? INTERFERON ALFA-2a (in ter FEER on AL fa 2 a) (Roferon-A) helps the immune system fight viral infections such as chronic hepatitis C, and certain cancers like Philadelphia chromosome positive chronic myelogenous leukemia, and hairy cell leukemia. This  medicine may be used for other purposes; ask your health care provider or pharmacist if you have questions. COMMON BRAND NAME(S): Roferon A What should I tell my health care provider before I take this medicine? They need to know if you have any of these conditions: -alcoholism or other drug abuse or addiction -autoimmune disease like psoriasis, Raynaud's phenomenon, rheumatoid arthritis, or systemic lupus erythematosus -blood or bleeding disorders -depression or mental disorders -diabetes -heart disease -high blood pressure -immune system problems like HIV infection -kidney disease -liver disease -lung disease -seizures -stomach problems -thyroid disease -transplant recipient -an unusual or allergic reaction to interferons, other medicines, benzyl alcohol, E. coli proteins, foods, dyes, or preservatives -pregnant or trying to get pregnant -breast-feeding How should I use this medicine? The medicine is for injection under the skin. Do not shake the prefilled syringes. Shaking can destroy the medicine. If you are giving yourself the injections, make sure you follow the directions carefully. If you have any questions about how to give your medicine, call your health care provider. If you experience flu-like effects, inject the dose at bedtime to decrease these effects. Do not reuse syringes or needles. It is important that you put your used needles and syringes in a special sharps container. Do not put them in a trash can. If you do not have a sharps container, call your pharmacist or healthcare provider to get one. A special MedGuide will be given to you by the pharmacist with each prescription and refill. Be sure to read this information carefully each time. Talk to your pediatrician regarding the use of this medicine in children. Special care may be needed. Patients over 54 years old may have a stronger reaction and need a smaller dose. Overdosage: If you think you have taken too much of  this medicine contact a poison control center or emergency room at once. NOTE: This medicine is only for you. Do not share this medicine with others. What if I miss a dose? If you miss a dose, take it as soon as you can. If it is almost time for your next dose, take only that dose. Do not take double or extra doses or take more than one dose in a day unless told to do so by your doctor. If you forget a dose for more than 2 days, call your health care professional. If you accidentally take too much, call your doctor immediately. What may interact with this medicine? -alcohol -antiviral medicines for HIV or AIDS -theophylline This list may not describe all possible interactions. Give your health care provider a list of all the medicines, herbs, non-prescription drugs, or dietary supplements you use. Also tell them if you smoke, drink alcohol, or use illegal drugs. Some items may interact with your medicine. What should I watch for while using this medicine? Visit your doctor or health care professional for regular checks on your progress. You will need regular blood checks. Do not change brands of medicine without consulting your doctor or health care professional. Different brands of medicine can act differently in your body. Check with your pharmacist if your refills do not look like your original product. You may get drowsy or dizzy. Do not drive, use machinery, or do anything  that needs mental alertness until you know how this medicine affects you. Alcohol can make you more drowsy or dizzy, increase confusion and lightheadedness. Avoid alcoholic drinks. This medicine can cause flu-like symptoms and make you feel generally unwell. Report any side effects, but continue your medicine even though you feel ill, unless your doctor or health care professional tells you to stop. If you get a fever or sore throat that does not go away after the first few weeks of treatment, do not treat yourself. Call your  doctor or health care professional as soon as you can if you think you have an infection. Other signs of infection include cough, lower back or side pain, and pain or difficulty passing urine. Do not become pregnant while taking this medicine. Women should inform their doctor if they wish to become pregnant or think they might be pregnant. There is a potential for serious side effects to an unborn child. Talk to your health care professional or pharmacist for more information. Do not breast-feed an infant while taking this medicine. This medicine can cause blood problems and may increase your risk to bruise or bleed. Call your doctor or health care professional if you notice any unusual bleeding. Be careful not to cut, bruise, or injure yourself because you may get an infection and bleed more than usual. Be careful brushing and flossing your teeth or using a toothpick while receiving this medicine because you may get an infection or bleed more easily. If you have any dental work done, tell your dentist you are receiving this medicine. What side effects may I notice from receiving this medicine? Side effects that you should report to your doctor or health care professional as soon as possible: -allergic reactions like skin rash, itching or hives, swelling of the face, lips, or tongue -breathing problems -changes in vision -chest pain or palpitations -depression -fast, irregular heartbeat -seizures -severe abdominal pain -signs of infection - fever or chills, cough, sore throat, pain or difficulty passing urine -unusual bleeding or bruising -unusually weak or tired -yellowing of the eyes or skin Side effects that usually do not require medical attention (report to your doctor or health care professional if they continue or are bothersome): -hair loss -headaches -joint, leg, muscle, or back aches -nausea, vomiting -tiredness -weight loss This list may not describe all possible side effects.  Call your doctor for medical advice about side effects. You may report side effects to FDA at 1-800-FDA-1088. Where should I keep my medicine? Keep out of the reach of children. Store in a refrigerator between 2 and 8 degrees C (36 and 46 degrees F). Do not freeze or expose to light. Throw away any unused medicine after the expiration date. NOTE: This sheet is a summary. It may not cover all possible information. If you have questions about this medicine, talk to your doctor, pharmacist, or health care provider.  2014, Elsevier/Gold Standard. (2008-04-03 16:04:33)

## 2014-03-29 LAB — STD PANEL
HIV 1&2 Ab, 4th Generation: NONREACTIVE
Hepatitis B Surface Ag: NEGATIVE

## 2014-03-29 LAB — GC/CHLAMYDIA PROBE AMP, URINE
Chlamydia, Swab/Urine, PCR: NEGATIVE
GC Probe Amp, Urine: NEGATIVE

## 2014-03-29 LAB — HEPATITIS C ANTIBODY: HCV AB: NEGATIVE

## 2014-04-03 ENCOUNTER — Telehealth: Payer: Self-pay | Admitting: Orthopedic Surgery

## 2014-04-03 NOTE — Telephone Encounter (Signed)
LMTCB for result  ?

## 2014-04-13 ENCOUNTER — Telehealth: Payer: Self-pay | Admitting: Obstetrics and Gynecology

## 2014-04-13 NOTE — Telephone Encounter (Signed)
Return call to patient, notified of negative GC/Chl test results as directed by Dr Quincy Simmonds. Patient requests we use this phone number in future.   Routing to provider for final review. Patient agreeable to disposition. Will close encounter    CC: Starla Curl,  Can you mark this patient's preferred number. Then close encounter.

## 2014-04-13 NOTE — Telephone Encounter (Signed)
Patient is returning a call to Amy. °

## 2014-04-17 ENCOUNTER — Other Ambulatory Visit: Payer: Self-pay | Admitting: General Surgery

## 2014-04-17 DIAGNOSIS — R0789 Other chest pain: Secondary | ICD-10-CM

## 2014-04-18 ENCOUNTER — Other Ambulatory Visit: Payer: Self-pay | Admitting: General Surgery

## 2014-04-18 ENCOUNTER — Encounter: Payer: Self-pay | Admitting: General Surgery

## 2014-04-18 ENCOUNTER — Ambulatory Visit: Payer: Managed Care, Other (non HMO) | Admitting: Cardiology

## 2014-04-18 ENCOUNTER — Ambulatory Visit (INDEPENDENT_AMBULATORY_CARE_PROVIDER_SITE_OTHER): Payer: Managed Care, Other (non HMO) | Admitting: Cardiology

## 2014-04-18 DIAGNOSIS — I350 Nonrheumatic aortic (valve) stenosis: Secondary | ICD-10-CM

## 2014-04-18 DIAGNOSIS — Q231 Congenital insufficiency of aortic valve: Secondary | ICD-10-CM

## 2014-04-18 DIAGNOSIS — R9430 Abnormal result of cardiovascular function study, unspecified: Secondary | ICD-10-CM

## 2014-04-18 DIAGNOSIS — R0789 Other chest pain: Secondary | ICD-10-CM

## 2014-04-18 DIAGNOSIS — I359 Nonrheumatic aortic valve disorder, unspecified: Secondary | ICD-10-CM

## 2014-04-18 NOTE — Progress Notes (Signed)
Exercise Treadmill Test  Pre-Exercise Testing Evaluation Rhythm: normal sinus  Rate: 81 bpm     Test  Exercise Tolerance Test Ordering MD: Fransico Him, MD  Interpreting MD: Fransico Him, MD  Unique Test No: 1  Treadmill:  1  Indication for ETT: chest pain - rule out ischemia  Contraindication to ETT: No   Stress Modality: exercise - treadmill  Cardiac Imaging Performed: non   Protocol: standard Bruce - maximal  Max BP:  104/71  Max MPHR (bpm):  187 85% MPR (bpm):  159  MPHR obtained (bpm):  153 81%  Reached 85% MPHR (min:sec):  Did not achieve Total Exercise Time (min-sec):  9 min  Workload in METS:  10.1 Borg Scale: 13  Reason ETT Terminated:  hypotension (>20mm drop SBP)    ST Segment Analysis At Rest: normal ST segments - no evidence of significant ST depression With Exercise: no evidence of significant ST depression  Other Information Arrhythmia:  No Angina during ETT:  absent (0) Quality of ETT:  non-diagnostic  ETT Interpretation:  borderline (indeterminate) with no ST changes but patient had a hypotensive BP response to exercise.  Comments: Abnormal ETT with hypotensive BP response to exercise but no ST changes  Recommendations: She has severe AS by echo and had hypotensive BP response to exercise.  Will proceed with right and left heart catheterization to assess AS

## 2014-04-30 HISTORY — PX: CARDIAC CATHETERIZATION: SHX172

## 2014-05-04 ENCOUNTER — Encounter: Payer: Self-pay | Admitting: Internal Medicine

## 2014-05-04 ENCOUNTER — Ambulatory Visit (INDEPENDENT_AMBULATORY_CARE_PROVIDER_SITE_OTHER): Payer: Managed Care, Other (non HMO) | Admitting: Internal Medicine

## 2014-05-04 VITALS — BP 120/68 | HR 104 | Temp 98.2°F | Wt 115.1 lb

## 2014-05-04 DIAGNOSIS — G43901 Migraine, unspecified, not intractable, with status migrainosus: Secondary | ICD-10-CM

## 2014-05-04 DIAGNOSIS — J309 Allergic rhinitis, unspecified: Secondary | ICD-10-CM | POA: Insufficient documentation

## 2014-05-04 DIAGNOSIS — R040 Epistaxis: Secondary | ICD-10-CM

## 2014-05-04 DIAGNOSIS — G43009 Migraine without aura, not intractable, without status migrainosus: Secondary | ICD-10-CM

## 2014-05-04 MED ORDER — METHYLPREDNISOLONE ACETATE 80 MG/ML IJ SUSP
80.0000 mg | Freq: Once | INTRAMUSCULAR | Status: AC
  Administered 2014-05-04: 80 mg via INTRAMUSCULAR

## 2014-05-04 MED ORDER — KETOROLAC TROMETHAMINE 30 MG/ML IJ SOLN
30.0000 mg | Freq: Once | INTRAMUSCULAR | Status: AC
  Administered 2014-05-04: 30 mg via INTRAMUSCULAR

## 2014-05-04 MED ORDER — ELETRIPTAN HYDROBROMIDE 40 MG PO TABS: 40.0000 mg | ORAL_TABLET | ORAL | Status: DC | PRN

## 2014-05-04 MED ORDER — FEXOFENADINE HCL 180 MG PO TABS: 180.0000 mg | ORAL_TABLET | Freq: Every day | ORAL | Status: DC

## 2014-05-04 MED ORDER — ELETRIPTAN HYDROBROMIDE 40 MG PO TABS
40.0000 mg | ORAL_TABLET | ORAL | Status: DC | PRN
Start: 2014-05-04 — End: 2014-05-04

## 2014-05-04 NOTE — Assessment & Plan Note (Signed)
Mild to mod, for depomedrol IM, allegra prn,  to f/u any worsening symptoms or concerns

## 2014-05-04 NOTE — Assessment & Plan Note (Signed)
Mild, one time, short lived, no recurrence, no bleeding diathesis or anti-coag/plt, ok to follow for now

## 2014-05-04 NOTE — Progress Notes (Signed)
Pre visit review using our clinic review tool, if applicable. No additional management support is needed unless otherwise documented below in the visit note. 

## 2014-05-04 NOTE — Patient Instructions (Addendum)
You had the steroid shot today, and the pain shot today  Please take all new medication as prescribed - the allegra as needed  Please continue all other medications as before, and refills have been done if requested - the relpax  Please have the pharmacy call with any other refills you may need.  You should call with further nosebleeds that are difficult to control, as you may need ENT referral

## 2014-05-04 NOTE — Progress Notes (Signed)
Subjective:    Patient ID: Danielle Obrien, female    DOB: Aug 05, 1981, 33 y.o.   MRN: [id number]  HPI  Here to f/u, has typical migraine x 3 days, left sided, throbbing with photophobia, nausea.  Also incidentally with Does have several wks ongoing nasal allergy symptoms with clearish congestion, itch and sneezing, without fever, pain, ST, cough, swelling or wheezing, but has visible nasal and sinus swelling today.   Pt denies fever, wt loss, night sweats, loss of appetite, or other constitutional symptoms    Asks for hydrocodone for migraine, has take before per Dr Ronnald Ramp.  Had nosebleed on right x 5 min this am, better with compression, no recurrence. Past Medical History  Diagnosis Date  . Allergy   . Arthritis     In hips  . Asthma   . Hypertension   . Heart murmur     bicuspid AV  . Anemia   . Alcohol abuse, in remission 2012  . Heart murmur   . Abnormal Pap smear of cervix     --recurrent ascus w/Pos. HR HPV  . Bipolar disorder   . ADD (attention deficit disorder)    Past Surgical History  Procedure Laterality Date  . Hip surgery  1981    Hip Reset   . Tympanostomy tube placement  1981/1982  . Hernia repair  1981/1982  . Tonsillectomy and adenoidectomy  1990  . Hip surgery  1990    Plate was reconstructed/ took out growth plate in Right knee  . Hip surgery  1992    Plate removed in Left hip  . Anterior cruciate ligament repair  1993  . Lymph node biopsy  1995  . Nasal septum surgery  2002  . Abdominal hysterectomy  02/06/2009    Robotic total laparoscopic hysterectomy  . 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 Colposcopy  10/2008    CIN I & II  . Colposcopy  07/2000    Neg. ECC  . Colposcopy  06/2001    CIN I  . Colposcopy  08/2004    ECC--atypia    reports that she has been smoking Cigarettes.  She has a 6 pack-year smoking history. She has never used smokeless tobacco. She reports that she does not drink alcohol  or use illicit drugs. family history includes Hyperlipidemia in her brother, father, and mother; Hypertension in her brother, father, and mother; Migraines in her father; Thyroid disease in her maternal grandfather, maternal grandmother, and mother. There is no history of Cancer. Allergies  Allergen Reactions  . Demerol Nausea And Vomiting  . Morphine And Related Nausea And Vomiting  . Codeine Itching   Current Outpatient Prescriptions on File Prior to Visit  Medication Sig Dispense Refill  . atomoxetine (STRATTERA) 80 MG capsule Take 80 mg by mouth daily.      Marland Kitchen eletriptan (RELPAX) 40 MG tablet Take 1 tablet (40 mg total) by mouth as needed for migraine. may repeat in 2 hours if necessary  10 tablet  11  . Fluticasone-Salmeterol (ADVAIR DISKUS) 250-50 MCG/DOSE AEPB Inhale 1 puff into the lungs 2 (two) times daily.  3 each  0  . Fluticasone-Salmeterol (ADVAIR HFA IN) Inhale into the lungs as needed.      . lamoTRIgine (LAMICTAL) 200 MG tablet TAKE 1 TABLET BY MOUTH TWICE DAILY  180 tablet  1  . lansoprazole (PREVACID) 15 MG capsule Take 15 mg by mouth daily.        Marland Kitchen  losartan (COZAAR) 100 MG tablet TAKE 1 TABLET BY MOUTH DAILY  30 tablet  3  . phenazopyridine (PYRIDIUM) 200 MG tablet Take 200 mg by mouth as needed.       . traZODone (DESYREL) 50 MG tablet Take 1-2 tablets by mouth at bedtime as needed.       No current facility-administered medications on file prior to visit.    Review of Systems  Constitutional: Negative for unusual diaphoresis or other sweats  HENT: Negative for ringing in ear Eyes: Negative for double vision or worsening visual disturbance.  Respiratory: Negative for choking and stridor.   Gastrointestinal: Negative for vomiting or other signifcant bowel change Genitourinary: Negative for hematuria or decreased urine volume.  Musculoskeletal: Negative for other MSK pain or swelling Skin: Negative for color change and worsening wound.  Neurological: Negative for  tremors and numbness other than noted  Psychiatric/Behavioral: Negative for decreased concentration or agitation other than above       Objective:   Physical Exam BP 120/68  Pulse 104  Temp(Src) 98.2 F (36.8 C) (Oral)  Wt 115 lb 2 oz (52.22 kg)  SpO2 97% VS noted,  Constitutional: Pt appears well-developed, well-nourished.  HENT: Head: NCAT.  Right Ear: External ear normal.  Left Ear: External ear normal.  Eyes: . Pupils are equal, round, and reactive to light. Conjunctivae and EOM are normal Neck: Normal range of motion. Neck supple.  Bilat tm's with mild erythema.  Max sinus areas non tender, mild swelling.  Pharynx with mild erythema, no exudate Cardiovascular: Normal rate and regular rhythm.   Pulmonary/Chest: Effort normal and breath sounds normal.  Abd:  Soft, NT, ND, + BS Neurological: Pt is alert. Not confused , motor grossly intact Skin: Skin is warm. No rash Psychiatric: Pt behavior is normal. No agitation. mild nervous    Assessment & Plan:

## 2014-05-04 NOTE — Addendum Note (Signed)
Addended by: Biagio Borg on: 05/04/2014 03:40 PM   Modules accepted: Orders

## 2014-05-04 NOTE — Assessment & Plan Note (Signed)
For toradol IM, refill relpax, did not refill hydrocodone, I think should come from PCP if needed, consider [facility] wellness referral

## 2014-05-07 ENCOUNTER — Telehealth: Payer: Self-pay

## 2014-05-07 DIAGNOSIS — G43901 Migraine, unspecified, not intractable, with status migrainosus: Secondary | ICD-10-CM

## 2014-05-07 MED ORDER — SUMATRIPTAN-NAPROXEN SODIUM 85-500 MG PO TABS: 1.0000 | ORAL_TABLET | ORAL | Status: DC | PRN

## 2014-05-07 NOTE — Telephone Encounter (Signed)
The patient called and is hoping to switch her migraine medicine. She states she still has a migraine.    Callback - [phone]

## 2014-05-07 NOTE — Telephone Encounter (Signed)
Patient seen 05/04/14 for migraine,current Relpax medication not working and would like something different. Thanks

## 2014-05-07 NOTE — Telephone Encounter (Signed)
Try treximet

## 2014-05-08 NOTE — Telephone Encounter (Signed)
Pt.notified

## 2014-05-14 ENCOUNTER — Other Ambulatory Visit: Payer: Managed Care, Other (non HMO)

## 2014-05-15 ENCOUNTER — Other Ambulatory Visit (INDEPENDENT_AMBULATORY_CARE_PROVIDER_SITE_OTHER): Payer: Managed Care, Other (non HMO)

## 2014-05-15 DIAGNOSIS — I350 Nonrheumatic aortic (valve) stenosis: Secondary | ICD-10-CM

## 2014-05-15 DIAGNOSIS — I359 Nonrheumatic aortic valve disorder, unspecified: Secondary | ICD-10-CM

## 2014-05-15 LAB — BASIC METABOLIC PANEL
BUN: 17 mg/dL (ref 6–23)
CO2: 29 mEq/L (ref 19–32)
CREATININE: 1 mg/dL (ref 0.4–1.2)
Calcium: 9.7 mg/dL (ref 8.4–10.5)
Chloride: 102 mEq/L (ref 96–112)
GFR: 71.57 mL/min (ref >60.00)
GLUCOSE: 99 mg/dL (ref 70–99)
POTASSIUM: 4.6 meq/L (ref 3.5–5.1)
Sodium: 136 mEq/L (ref 135–145)

## 2014-05-15 LAB — CBC WITH DIFFERENTIAL/PLATELET
Basophils Absolute: 0 10*3/uL (ref 0.0–0.1)
Basophils Relative: 0.7 % (ref 0.0–3.0)
Eosinophils Absolute: 0.2 10*3/uL (ref 0.0–0.7)
Eosinophils Relative: 2.7 % (ref 0.0–5.0)
HEMATOCRIT: 42.6 % (ref 36.0–46.0)
Hemoglobin: 14.3 g/dL (ref 12.0–15.0)
LYMPHS ABS: 2.6 10*3/uL (ref 0.7–4.0)
Lymphocytes Relative: 39.8 % (ref 12.0–46.0)
MCHC: 33.4 g/dL (ref 30.0–36.0)
MCV: 90.6 fl (ref 78.0–100.0)
MONO ABS: 0.5 10*3/uL (ref 0.1–1.0)
MONOS PCT: 7.2 % (ref 3.0–12.0)
Neutro Abs: 3.3 10*3/uL (ref 1.4–7.7)
Neutrophils Relative %: 49.6 % (ref 43.0–77.0)
PLATELETS: 161 10*3/uL (ref 150.0–400.0)
RBC: 4.71 Mil/uL (ref 3.87–5.11)
RDW: 13.6 % (ref 11.5–15.5)
WBC: 6.6 10*3/uL (ref 4.0–10.5)

## 2014-05-15 LAB — PROTIME-INR
INR: 0.9 ratio (ref 0.8–1.0)
Prothrombin Time: 9.6 s (ref 9.6–13.1)

## 2014-05-18 ENCOUNTER — Encounter (HOSPITAL_COMMUNITY)
Admission: RE | Disposition: A | Discharge status: Home / Self Care | Payer: Self-pay | Source: Ambulatory Visit | Attending: Interventional Cardiology

## 2014-05-18 ENCOUNTER — Ambulatory Visit (HOSPITAL_COMMUNITY)
Admission: RE | Admit: 2014-05-18 | Discharge: 2014-05-18 | Disposition: A | Discharge status: Home / Self Care | Payer: Managed Care, Other (non HMO) | Source: Ambulatory Visit | Attending: Interventional Cardiology | Admitting: Interventional Cardiology

## 2014-05-18 DIAGNOSIS — I350 Nonrheumatic aortic (valve) stenosis: Secondary | ICD-10-CM

## 2014-05-18 DIAGNOSIS — Q231 Congenital insufficiency of aortic valve: Secondary | ICD-10-CM

## 2014-05-18 DIAGNOSIS — F17200 Nicotine dependence, unspecified, uncomplicated: Secondary | ICD-10-CM | POA: Insufficient documentation

## 2014-05-18 DIAGNOSIS — R011 Cardiac murmur, unspecified: Secondary | ICD-10-CM | POA: Insufficient documentation

## 2014-05-18 DIAGNOSIS — I10 Essential (primary) hypertension: Secondary | ICD-10-CM | POA: Insufficient documentation

## 2014-05-18 DIAGNOSIS — I359 Nonrheumatic aortic valve disorder, unspecified: Secondary | ICD-10-CM

## 2014-05-18 DIAGNOSIS — J45909 Unspecified asthma, uncomplicated: Secondary | ICD-10-CM | POA: Insufficient documentation

## 2014-05-18 DIAGNOSIS — D649 Anemia, unspecified: Secondary | ICD-10-CM | POA: Insufficient documentation

## 2014-05-18 DIAGNOSIS — M1610 Unilateral primary osteoarthritis, unspecified hip: Secondary | ICD-10-CM | POA: Insufficient documentation

## 2014-05-18 HISTORY — PX: LEFT AND RIGHT HEART CATHETERIZATION WITH CORONARY ANGIOGRAM: SHX5449

## 2014-05-18 LAB — POCT I-STAT 3, VENOUS BLOOD GAS (G3P V)
ACID-BASE DEFICIT: 4 mmol/L — AB (ref 0.0–2.0)
Bicarbonate: 23 mEq/L (ref 20.0–24.0)
O2 Saturation: 80 %
TCO2: 24 mmol/L (ref 0–100)
pCO2, Ven: 48.3 mmHg (ref 45.0–50.0)
pH, Ven: 7.286 (ref 7.250–7.300)
pO2, Ven: 50 mmHg — ABNORMAL HIGH (ref 30.0–45.0)

## 2014-05-18 LAB — POCT I-STAT 3, ART BLOOD GAS (G3+)
ACID-BASE DEFICIT: 2 mmol/L (ref 0.0–2.0)
BICARBONATE: 23.9 meq/L (ref 20.0–24.0)
O2 Saturation: 99 %
TCO2: 25 mmol/L (ref 0–100)
pCO2 arterial: 47 mmHg — ABNORMAL HIGH (ref 35.0–45.0)
pH, Arterial: 7.315 — ABNORMAL LOW (ref 7.350–7.450)
pO2, Arterial: 155 mmHg — ABNORMAL HIGH (ref 80.0–100.0)

## 2014-05-18 LAB — POCT ACTIVATED CLOTTING TIME: ACTIVATED CLOTTING TIME: 157 s

## 2014-05-18 SURGERY — LEFT AND RIGHT HEART CATHETERIZATION WITH CORONARY ANGIOGRAM
Anesthesia: LOCAL

## 2014-05-18 MED ORDER — MIDAZOLAM HCL 2 MG/2ML IJ SOLN
INTRAMUSCULAR | Status: AC
  Filled 2014-05-18: qty 2

## 2014-05-18 MED ORDER — SODIUM CHLORIDE 0.9 % IV SOLN
INTRAVENOUS | Status: DC
  Administered 2014-05-18: 08:00:00 via INTRAVENOUS

## 2014-05-18 MED ORDER — NITROGLYCERIN 0.2 MG/ML ON CALL CATH LAB
INTRAVENOUS | Status: AC
  Filled 2014-05-18: qty 1

## 2014-05-18 MED ORDER — TRAMADOL HCL 50 MG PO TABS: 50.0000 mg | ORAL_TABLET | Freq: Four times a day (QID) | ORAL | Status: DC | PRN

## 2014-05-18 MED ORDER — ASPIRIN 81 MG PO CHEW
81.0000 mg | CHEWABLE_TABLET | ORAL | Status: AC
  Administered 2014-05-18: 81 mg via ORAL
  Filled 2014-05-18: qty 1

## 2014-05-18 MED ORDER — HEPARIN (PORCINE) IN NACL 2-0.9 UNIT/ML-% IJ SOLN
INTRAMUSCULAR | Status: AC
  Filled 2014-05-18: qty 1500

## 2014-05-18 MED ORDER — SODIUM CHLORIDE 0.9 % IV SOLN: 250.0000 mL | INTRAVENOUS | Status: DC | PRN

## 2014-05-18 MED ORDER — SODIUM CHLORIDE 0.9 % IV SOLN: 1.0000 mL/kg/h | INTRAVENOUS | Status: DC

## 2014-05-18 MED ORDER — HEPARIN SODIUM (PORCINE) 1000 UNIT/ML IJ SOLN
INTRAMUSCULAR | Status: AC
  Filled 2014-05-18: qty 1

## 2014-05-18 MED ORDER — DIAZEPAM 5 MG PO TABS
5.0000 mg | ORAL_TABLET | Freq: Once | ORAL | Status: AC
  Administered 2014-05-18: 5 mg via ORAL
  Filled 2014-05-18: qty 1

## 2014-05-18 MED ORDER — LIDOCAINE HCL (PF) 1 % IJ SOLN
INTRAMUSCULAR | Status: AC
  Filled 2014-05-18: qty 30

## 2014-05-18 MED ORDER — FENTANYL CITRATE 0.05 MG/ML IJ SOLN
INTRAMUSCULAR | Status: AC
  Filled 2014-05-18: qty 2

## 2014-05-18 MED ORDER — SODIUM CHLORIDE 0.9 % IJ SOLN: 3.0000 mL | INTRAMUSCULAR | Status: DC | PRN

## 2014-05-18 MED ORDER — SODIUM CHLORIDE 0.9 % IJ SOLN: 3.0000 mL | Freq: Two times a day (BID) | INTRAMUSCULAR | Status: DC

## 2014-05-18 NOTE — CV Procedure (Signed)
    PROCEDURE:  Right heart cath; Left heart catheterization with selective coronary angiography, left ventriculogram.  INDICATIONS:  Aortic stenosis  The risks, benefits, and details of the procedure were explained to the patient.  The patient verbalized understanding and wanted to proceed.  Informed written consent was obtained.  PROCEDURE TECHNIQUE:  After Xylocaine anesthesia a 55F sheath was placed in the right femoral artery with a single anterior needle wall stick.  A 7Fr sheath was placed in the right femoral vein. Left coronary angiography was done using a Judkins L4 guide catheter, and JL 3.5.  Right coronary angiography was done using a Judkins R4 guide catheter.  Swan-Ganz catheter was advanced for hemodynamic assessment, under fluoroscopic guidance. Pulmonary artery and aortic saturations were obtained. We tried to cross the aortic valve with a pigtail catheter, a JR 4 catheter but were unsuccessful. We tried with a straight wire and the JR 4 catheter. This is unsuccessful. An a.l. 1 catheter was exchanged and with a J-wire, the aortic valve was crossed. Simultaneous right femoral artery and left ventricular pressure was obtained. Left ventriculography was done using a pigtail catheter.    CONTRAST:  Total of 100 cc.  COMPLICATIONS:  None.    HEMODYNAMICS:  Aortic pressure was 98/66; LV pressure was 131/5; LVEDP 12.  There was no gradient between the left ventricle and aorta.  Right atrial pressure 9 over a, mean right atrial pressure 6 mmHg; RV pressure 20 for her to come RBBB 7 mmHg pulmonary artery pressure 22/9, mean pulmonary artery pressure 14 mmHg; pulmonary capillary wedge pressure 11/14, mean pulmonary capillary wedge pressure 10 mmHg; pulmonary artery saturation 80%, aortic saturation 99%. Cardiac output 5.29 L per minute; cardiac index 3.6. Aortic valve area 1.3 cm.  ANGIOGRAPHIC DATA:   The left main coronary artery is either very short, or absent. It seems more likely that it  is absent.  The left anterior descending artery is a large vessel proximally. There is a large diagonal vessel which is widely patent. The apical LAD is fairly small and just reaches the apex. There is no atherosclerosis in the LAD system..  The left circumflex artery is a large, codominant vessel. There is a large, branching obtuse marginal which appears widely patent. There are 2 small obtuse marginals coming off of the proximal circumflex. The left PDA is widely patent.  The right coronary artery is a medium-sized, co dominant vessel. There is significant catheter-induced spasm in the proximal vessel. This improved with intra-arterial nitroglycerin.Marland Kitchen  LEFT VENTRICULOGRAM:  Left ventricular angiogram was done in the 30 RAO projection and revealed normal left ventricular wall motion and systolic function with an estimated ejection fraction of 60 %.  LVEDP was 12 mmHg.  IMPRESSIONS:  1. 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 2. Normal left anterior descending artery and its branches. 3. Normal left circumflex artery and its branches. 4. Normal right coronary artery. 5. Normal left ventricular systolic function.  LVEDP 12 mmHg.  Ejection fraction 60 %.  Cardiac output 5.29 L per minute, cardiac index 3.6. No pulmonary hypertension. Moderate aortic stenosis with valve area 1.3 cm.  RECOMMENDATION:  She'll followup with Dr. Radford Pax. Further decisions regarding aortic gout intervention will be made at that time. She'll be watched in the holding area for several hours.

## 2014-05-18 NOTE — Consult Note (Signed)
Danielle Obrien is a 33 y.o. female with a history of HTN, bicuspid AV with heart murmur and asthma who presents today for evaluation of recent echo which showed severe AS with normal LVF. She denies any chest pain, SOB, DOE, LE edema, dizziness, palpitations or syncope. She has had occasional chest pain over her chest that she describes as a pressure like someone sitting on her chest and occurs once weekly that she thinks is more related to allergies and asthma and this has been occurring for years. She has chronic SOB from her asthma which is stable. Occasionally she will notice her heart fluttering but it is very infrequent and has occurred for years. She denies any dizziness or syncope. She walks a mile 3 days a week without any problems.  Wt Readings from Last 3 Encounters:   03/16/14  119 lb 12.8 oz (54.341 kg)   03/05/14  115 lb 9.6 oz (52.436 kg)   02/05/14  117 lb (53.071 kg)    Past Medical History   Diagnosis  Date   .  Allergy    .  Arthritis      In hips   .  Asthma    .  Hypertension    .  Heart murmur      bicuspid AV   .  Anemia    .  Alcohol abuse, in remission  2012   .  Heart murmur     Current Outpatient Prescriptions   Medication  Sig  Dispense  Refill   .  atomoxetine (STRATTERA) 80 MG capsule  Take 80 mg by mouth daily.     Marland Kitchen  eletriptan (RELPAX) 40 MG tablet  Take 1 tablet (40 mg total) by mouth as needed for migraine. may repeat in 2 hours if necessary  10 tablet  11   .  Fluticasone-Salmeterol (ADVAIR DISKUS) 250-50 MCG/DOSE AEPB  Inhale 1 puff into the lungs 2 (two) times daily.  3 each  0   .  Fluticasone-Salmeterol (ADVAIR HFA IN)  Inhale into the lungs as needed.     .  lamoTRIgine (LAMICTAL) 200 MG tablet  TAKE 1 TABLET BY MOUTH TWICE DAILY  180 tablet  1   .  lansoprazole (PREVACID) 15 MG capsule  Take 15 mg by mouth daily.     Marland Kitchen  losartan (COZAAR) 100 MG tablet  TAKE 1 TABLET BY MOUTH DAILY  30 tablet  3   .  phenazopyridine (PYRIDIUM) 200 MG tablet   Take 200 mg by mouth as needed.     .  traZODone (DESYREL) 50 MG tablet  Take 1-2 tablets by mouth at bedtime as needed.      No current facility-administered medications for this visit.   Allergies:  Allergies   Allergen  Reactions   .  Demerol  Nausea And Vomiting   .  Morphine And Related  Nausea And Vomiting   .  Codeine  Itching   Social History: The patient reports that she has been smoking Cigarettes. She has a 6 pack-year smoking history. She has never used smokeless tobacco. She reports that she does not drink alcohol or use illicit drugs.  Family History: The patient's family history includes Hyperlipidemia in her brother, father, and mother; Hypertension in her brother, father, and mother; Migraines in her father; Thyroid disease in her maternal grandfather, maternal grandmother, and mother. There is no history of Cancer.  ROS: Please see the history of present illness. All other systems reviewed  and negative.  PHYSICAL EXAM:  VS: BP 114/81  Pulse 80  Ht 5' 0.25" (1.53 m)  Wt 119 lb 12.8 oz (54.341 kg)  BMI 23.21 kg/m2  Well nourished, well developed, in no acute distress  HEENT: normal  Neck: no JVD  Cardiac: normal S1, S2; RRR; 2/6 late peaking systolic murmur at RUSB radiating to LLSB and into carotid arteries bilaterally  Lungs: clear to auscultation bilaterally, no wheezing, rhonchi or rales  Abd: soft, nontender, no hepatomegaly  Ext: no edema  Skin: warm and dry  Neuro: CNs 2-12 intact, no focal abnormalities noted  EKG: NSR with LAE and otherwise normal intervals with no ST changes  ASSESSMENT AND PLAN:  1. Bicuspid AV with heart murmur and 2D echo recently showing severe AS. Hypotensive response to exercise on the treadmill.  Referred for right and left heart cath.  Plan to cross the valve since her sx are somewhat vague.

## 2014-05-18 NOTE — Discharge Instructions (Signed)
No lifting more than 10 lbs for one week.  Return to work on 05/25/14.  Angiogram, Care After  Refer to this sheet in the next few weeks. These instructions provide you with information on caring for yourself after your procedure. Your health care provider may also give you more specific instructions. Your treatment has been planned according to current medical practices, but problems sometimes occur. Call your health care provider if you have any problems or questions after your procedure.  WHAT TO EXPECT AFTER THE PROCEDURE After your procedure, it is typical to have the following sensations:  Minor discomfort or tenderness and a small bump at the catheter insertion site. The bump should usually decrease in size and tenderness within 1 to 2 weeks.  Any bruising will usually fade within 2 to 4 weeks. HOME CARE INSTRUCTIONS   You may need to keep taking blood thinners if they were prescribed for you. Only take over-the-counter or prescription medicines for pain, fever, or discomfort as directed by your health care provider.  Do not apply powder or lotion to the site.  Do not sit in a bathtub, swimming pool, or whirlpool for 5 to 7 days.  You may shower 24 hours after the procedure. Remove the bandage (dressing) and gently wash the site with plain soap and water. Gently pat the site dry.  Inspect the site at least twice daily.  Limit your activity for the first 48 hours. Do not bend, squat, or lift anything over 20 lb (9 kg) or as directed by your health care provider.  Do not drive home if you are discharged the day of the procedure. Have someone else drive you. Follow instructions about when you can drive or return to work. SEEK MEDICAL CARE IF:  You get lightheaded when standing up.  You have drainage (other than a small amount of blood on the dressing).  You have chills.  You have a fever.  You have redness, warmth, swelling, or pain at the insertion site. SEEK IMMEDIATE  MEDICAL CARE IF:   You develop chest pain or shortness of breath, feel faint, or pass out.  You have bleeding, swelling larger than a walnut, or drainage from the catheter insertion site.  You develop pain, discoloration, coldness, or severe bruising in the leg or arm that held the catheter.  You have heavy bleeding from the site. If this happens, hold pressure on the site. MAKE SURE YOU:  Understand these instructions.  Will watch your condition.  Will get help right away if you are not doing well or get worse. Document Released: 06/04/2005 Document Revised: 11/21/2013 Document Reviewed: 04/10/2013 [facility] Patient Information 2015 [place], [place]. This information is not intended to replace advice given to you by your health care provider. Make sure you discuss any questions you have with your health care provider.

## 2014-05-21 ENCOUNTER — Telehealth: Payer: Self-pay | Admitting: Cardiology

## 2014-05-21 NOTE — Telephone Encounter (Signed)
Unable to LVM for pt to return call. No VM box set up for pt.

## 2014-05-21 NOTE — Telephone Encounter (Signed)
F/u ° ° °Pt returning your call °

## 2014-05-21 NOTE — Telephone Encounter (Signed)
New problem   Pt need to speak to nurse because she need a note for restriction. Please call pt she will give you details.

## 2014-05-21 NOTE — Telephone Encounter (Signed)
Called pt back to get more info from Pt. Pt needs Note when she goes back on Friday if she has any restrictions. She lifts 25-35 pounds items at a time. To Dr Radford Pax to advise.

## 2014-05-23 ENCOUNTER — Encounter: Payer: Self-pay | Admitting: General Surgery

## 2014-05-23 NOTE — Telephone Encounter (Signed)
Letter up front for pt

## 2014-05-23 NOTE — Telephone Encounter (Signed)
Called Pt unable to lmtrc VM not set up for pt. Letter wrote and printed in envelope for pt to pick up.

## 2014-05-23 NOTE — Telephone Encounter (Signed)
No lifting more than 30 pounds due to medical issues

## 2014-05-25 NOTE — Telephone Encounter (Signed)
Pt is aware letter is up front for pt.

## 2014-06-08 ENCOUNTER — Other Ambulatory Visit: Payer: Self-pay | Admitting: Internal Medicine

## 2014-06-15 ENCOUNTER — Encounter: Payer: Self-pay | Admitting: Nurse Practitioner

## 2014-06-15 ENCOUNTER — Ambulatory Visit (INDEPENDENT_AMBULATORY_CARE_PROVIDER_SITE_OTHER): Payer: Managed Care, Other (non HMO) | Admitting: Nurse Practitioner

## 2014-06-15 VITALS — BP 110/70 | HR 72 | Ht 60.0 in | Wt 110.0 lb

## 2014-06-15 DIAGNOSIS — Q231 Congenital insufficiency of aortic valve: Secondary | ICD-10-CM

## 2014-06-15 DIAGNOSIS — Z9889 Other specified postprocedural states: Secondary | ICD-10-CM

## 2014-06-15 NOTE — Progress Notes (Signed)
Danielle Obrien Date of Birth: Sep 30, 1981 Medical Record #[id number]  History of Present Illness: Danielle Obrien is seen back today for a post cath visit - seen for Dr. Radford Pax. She has a history of HTN, bicuspid AV with a heart murmur and asthma. She is bipolar as well.   Seen here in April - had some chest pain. Subsequently referred for cath after abnormal GXT with hypotensive BP response - normal findings on cath with moderate AS noted. AVA of 1.3.  Comes in today. Here with her mother - mother is deaf. Danielle Obrien is doing "peachy". Some dyspnea - feels that that is allergy related. No chest pain. Smoking less - has cut down to 3 cigs per day. Asking about work restrictions. No problems with her groin. BP is low normal. She is on full dose ARB therapy. Has returned to work. Not using SBE.   Current Outpatient Prescriptions  Medication Sig Dispense Refill  . CALCIUM PO Take 2 tablets by mouth daily.      Marland Kitchen eletriptan (RELPAX) 40 MG tablet Take 40 mg by mouth every 2 (two) hours as needed for migraine or headache. One tablet by mouth at onset of headache. May repeat in 2 hours if headache persists or recurs.      . Fluticasone-Salmeterol (ADVAIR) 250-50 MCG/DOSE AEPB Inhale 1 puff into the lungs 2 (two) times daily as needed (shortness of breath).      . lamoTRIgine (LAMICTAL) 200 MG tablet Take 200 mg by mouth daily.      Marland Kitchen losartan (COZAAR) 100 MG tablet TAKE 1 TABLET BY MOUTH DAILY  30 tablet  2  . SUMAtriptan-naproxen (TREXIMET) 85-500 MG per tablet Take 1 tablet by mouth every 2 (two) hours as needed for migraine.  10 tablet  5  . traZODone (DESYREL) 50 MG tablet Take 50 mg by mouth 3 (three) times daily as needed for sleep (and anxiety).        No current facility-administered medications for this visit.    Allergies  Allergen Reactions  . Demerol Nausea And Vomiting  . Morphine And Related Nausea And Vomiting  . Codeine Itching    Past Medical History  Diagnosis Date  . Allergy     . Arthritis     In hips  . Asthma   . Hypertension   . Bicuspid aortic valve   . Anemia   . Alcohol abuse, in remission 2012  . Abnormal Pap smear of cervix     --recurrent ascus w/Pos. HR HPV  . Bipolar disorder   . ADD (attention deficit disorder)     Past Surgical History  Procedure Laterality Date  . Hip surgery  1981    Hip Reset   . Tympanostomy tube placement  1981/1982  . Hernia repair  1981/1982  . Tonsillectomy and adenoidectomy  1990  . Hip surgery  1990    Plate was reconstructed/ took out growth plate in Right knee  . Hip surgery  1992    Plate removed in Left hip  . Anterior cruciate ligament repair  1993  . Lymph node biopsy  1995  . Nasal septum surgery  2002  . Abdominal hysterectomy  02/06/2009    Robotic total laparoscopic hysterectomy  . 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 Colposcopy  10/2008    CIN I & II  . Colposcopy  07/2000    Neg. ECC  . Colposcopy  06/2001  CIN I  . Colposcopy  08/2004    ECC--atypia  . Cardiac catheterization  June 2015    no CAD - moderate AS noted    History  Smoking status  . Current Every Day Smoker -- 0.50 packs/day for 12 years  . Types: Cigarettes  Smokeless tobacco  . Never Used    History  Alcohol Use No    Family History  Problem Relation Age of Onset  . Cancer Neg Hx   . Hyperlipidemia Mother   . Hypertension Mother   . Thyroid disease Mother   . Hyperlipidemia Father   . Hypertension Father   . Migraines Father   . Hypertension Brother   . Hyperlipidemia Brother   . Thyroid disease Maternal Grandmother   . Thyroid disease Maternal Grandfather     Review of Systems: The review of systems is per the HPI.  All other systems were reviewed and are negative.  Physical Exam: BP 110/70  Pulse 72  Ht 5' (1.524 m)  Wt 110 lb (49.896 kg)  BMI 21.48 kg/m2 Patient is very pleasant and in no acute distress. Smoker's cough noted. Weight stable.  Skin is warm and dry. Color is normal.  HEENT is unremarkable. Normocephalic/atraumatic. PERRL. Sclera are nonicteric. Neck is supple. No masses. No JVD. Lungs are clear. Cardiac exam shows a regular rate and rhythm. Harsh outflow murmur. Abdomen is soft. Extremities are without edema. Gait and ROM are intact. No gross neurologic deficits noted.  Wt Readings from Last 3 Encounters:  06/15/14 110 lb (49.896 kg)  05/18/14 114 lb (51.71 kg)  05/18/14 114 lb (51.71 kg)    LABORATORY DATA/PROCEDURES:  Lab Results  Component Value Date   WBC 6.6 05/15/2014   HGB 14.3 05/15/2014   HCT 42.6 05/15/2014   PLT 161.0 05/15/2014   GLUCOSE 99 05/15/2014   CHOL 245* 01/27/2013   TRIG 43.0 01/27/2013   HDL 67.70 01/27/2013   LDLDIRECT 171.0 01/27/2013   ALT 20 01/27/2013   AST 19 01/27/2013   NA 136 05/15/2014   K 4.6 05/15/2014   CL 102 05/15/2014   CREATININE 1.0 05/15/2014   BUN 17 05/15/2014   CO2 29 05/15/2014   TSH 0.35 01/27/2013   INR 0.9 05/15/2014    BNP (last 3 results) No results found for this basename: PROBNP,  in the last 8760 hours   Echo Study Conclusions from March 2015  - Left ventricle: The cavity size was normal. Wall thickness was normal. Systolic function was normal. The estimated ejection fraction was in the range of 60% to 65%. Wall motion was normal; there were no regional wall motion abnormalities. Left ventricular diastolic function parameters were normal. - Aortic valve: Functionally bicuspid; moderately calcified leaflets. Valve mobility was restricted. There was severe stenosis. Valve area: 0.94cm^2(VTI). Valve area: 0.9cm^2 (Vmax). - Pulmonary arteries: Systolic pressure was mildly increased. Impressions:  - Normal LV function; calcified, functionally bicuspid aortic valve with severe AS.    PROCEDURE: Right heart cath; Left heart catheterization with selective coronary angiography, left ventriculogram.  INDICATIONS: Aortic stenosis  The risks, benefits, and  details of the procedure were explained to the patient. The patient verbalized understanding and wanted to proceed. Informed written consent was obtained.  PROCEDURE TECHNIQUE: After Xylocaine anesthesia a 87F sheath was placed in the right femoral artery with a single anterior needle wall stick. A 7Fr sheath was placed in the right femoral vein. Left coronary angiography was done using a Judkins L4 guide catheter, and JL 3.5.  Right coronary angiography was done using a Judkins R4 guide catheter. Swan-Ganz catheter was advanced for hemodynamic assessment, under fluoroscopic guidance. Pulmonary artery and aortic saturations were obtained. We tried to cross the aortic valve with a pigtail catheter, a JR 4 catheter but were unsuccessful. We tried with a straight wire and the JR 4 catheter. This is unsuccessful. An a.l. 1 catheter was exchanged and with a J-wire, the aortic valve was crossed. Simultaneous right femoral artery and left ventricular pressure was obtained. Left ventriculography was done using a pigtail catheter.  CONTRAST: Total of 100 cc.  COMPLICATIONS: None.  HEMODYNAMICS: Aortic pressure was 98/66; LV pressure was 131/5; LVEDP 12. There was no gradient between the left ventricle and aorta. Right atrial pressure 9 over a, mean right atrial pressure 6 mmHg; RV pressure 20 for her to come RBBB 7 mmHg pulmonary artery pressure 22/9, mean pulmonary artery pressure 14 mmHg; pulmonary capillary wedge pressure 11/14, mean pulmonary capillary wedge pressure 10 mmHg; pulmonary artery saturation 80%, aortic saturation 99%. Cardiac output 5.29 L per minute; cardiac index 3.6. Aortic valve area 1.3 cm.  ANGIOGRAPHIC DATA: The left main coronary artery is either very short, or absent. It seems more likely that it is absent.  The left anterior descending artery is a large vessel proximally. There is a large diagonal vessel which is widely patent. The apical LAD is fairly small and just reaches the apex. There is  no atherosclerosis in the LAD system..  The left circumflex artery is a large, codominant vessel. There is a large, branching obtuse marginal which appears widely patent. There are 2 small obtuse marginals coming off of the proximal circumflex. The left PDA is widely patent.  The right coronary artery is a medium-sized, co dominant vessel. There is significant catheter-induced spasm in the proximal vessel. This improved with intra-arterial nitroglycerin.Marland Kitchen  LEFT VENTRICULOGRAM: Left ventricular angiogram was done in the 30 RAO projection and revealed normal left ventricular wall motion and systolic function with an estimated ejection fraction of 60 %. LVEDP was 12 mmHg.  IMPRESSIONS:  1. 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 2. Normal left anterior descending artery and its branches. 3. Normal left circumflex artery and its branches. 4. Normal right coronary artery. 5. Normal left ventricular systolic function. LVEDP 12 mmHg. Ejection fraction 60 %. Cardiac output 5.29 L per minute, cardiac index 3.6. No pulmonary hypertension. Moderate aortic stenosis with valve area 1.3 cm.  RECOMMENDATION: She'll followup with Dr. Radford Pax. Further decisions regarding aortic gout intervention will be made at that time. She'll be watched in the holding area for several hours.    Assessment / Plan: 1. Aortic stenosis - moderate per recent cath - she has echo planned for October - will get her back to see Dr. Radford Pax for discussion. Reminded of the cardinal symptoms to be on the lookout for. She stopped using SBE several years ago based on the change in guidelines. She would like Dr. Theodosia Blender input if this is still ok.   2. Smoking - she has cut down - encouraged her to continue with goal of total cessation  3. HTN - BP low normal - on ARB -  May need to cut back - she will continue to monitor.   See back in October. Needs note for work about her lifting restrictions of  "nothing over 30 pounds".   Patient is agreeable to this plan and will call if any problems develop in the interim.  Burtis Junes, RN, [facility] [place] [address] [place], [place]  [id number] [phone]

## 2014-06-15 NOTE — Progress Notes (Signed)
She does not need SBE prophylaxis since she does on have an AVR

## 2014-06-15 NOTE — Patient Instructions (Addendum)
Stay on your current medicines  See Dr. Radford Pax in 3 months after your echo for further discussion  Keep working on stopping smoking. Congrats for getting down to 3 cigs per day  Call the [place] office at [phone] if you have any questions, problems or concerns.

## 2014-06-18 NOTE — Progress Notes (Signed)
To Dr Radford Pax to review

## 2014-06-18 NOTE — Progress Notes (Signed)
Pt is aware.  

## 2014-06-18 NOTE — Progress Notes (Signed)
No SBE prophylaxis needed - she has AS but no AVR

## 2014-07-08 ENCOUNTER — Other Ambulatory Visit: Payer: Self-pay | Admitting: Internal Medicine

## 2014-09-07 ENCOUNTER — Other Ambulatory Visit: Payer: Self-pay | Admitting: Internal Medicine

## 2014-09-10 ENCOUNTER — Ambulatory Visit (HOSPITAL_COMMUNITY): Payer: Managed Care, Other (non HMO) | Attending: Cardiology | Admitting: Cardiology

## 2014-09-10 DIAGNOSIS — I359 Nonrheumatic aortic valve disorder, unspecified: Secondary | ICD-10-CM | POA: Diagnosis not present

## 2014-09-10 DIAGNOSIS — Z720 Tobacco use: Secondary | ICD-10-CM | POA: Diagnosis not present

## 2014-09-10 DIAGNOSIS — I350 Nonrheumatic aortic (valve) stenosis: Secondary | ICD-10-CM

## 2014-09-10 NOTE — Progress Notes (Signed)
Echo performed. 

## 2014-09-17 ENCOUNTER — Encounter: Payer: Self-pay | Admitting: Cardiology

## 2014-09-17 ENCOUNTER — Ambulatory Visit (INDEPENDENT_AMBULATORY_CARE_PROVIDER_SITE_OTHER): Payer: Managed Care, Other (non HMO) | Admitting: Cardiology

## 2014-09-17 VITALS — BP 104/80 | HR 73 | Ht 60.0 in | Wt 111.0 lb

## 2014-09-17 DIAGNOSIS — Q251 Coarctation of aorta: Secondary | ICD-10-CM

## 2014-09-17 DIAGNOSIS — I10 Essential (primary) hypertension: Secondary | ICD-10-CM

## 2014-09-17 DIAGNOSIS — G43809 Other migraine, not intractable, without status migrainosus: Secondary | ICD-10-CM

## 2014-09-17 DIAGNOSIS — Q231 Congenital insufficiency of aortic valve: Secondary | ICD-10-CM

## 2014-09-17 DIAGNOSIS — I350 Nonrheumatic aortic (valve) stenosis: Secondary | ICD-10-CM

## 2014-09-17 MED ORDER — LOSARTAN POTASSIUM 50 MG PO TABS: 50.0000 mg | ORAL_TABLET | Freq: Every day | ORAL | Status: DC

## 2014-09-17 NOTE — Patient Instructions (Addendum)
Your physician has recommended you make the following change in your medication:  DECREASE Losartan 50 mg once daily  Your physician has requested that you have a cardiac MRI. Cardiac MRI uses a computer to create images of your heart as its beating, producing both still and moving pictures of your heart and major blood vessels. For further information please visit [email or web address].[email or web address] Please follow the instruction sheet given to you today for more information.  Your physician has requested that you have an echocardiogram IN 1 YEAR. Echocardiography is a painless test that uses sound waves to create images of your heart. It provides your doctor with information about the size and shape of your heart and how well your heart's chambers and valves are working. This procedure takes approximately one hour. There are no restrictions for this procedure.  Your physician wants you to follow-up in: 6 months with Dr. Radford Pax.  You will receive a reminder letter in the mail two months in advance. If you don't receive a letter, please call our office to schedule the follow-up appointment.  Your physician has referred you to Neurology for migraines

## 2014-09-17 NOTE — Progress Notes (Signed)
[place], [place], [place]  [address] Phone: [phone] Fax:  [phone]  Date:  09/17/2014   ID:  Danielle Obrien, DOB August 02, 1981, MRN [id number]  PCP:  [name], MD  Cardiologist:  Fransico Him, MD    History of Present Illness: [name] has a history of HTN, bicuspid AV with a heart murmur and asthma. She is bipolar as well.  Seen here in April - had some chest pain. Subsequently referred for cath after abnormal GXT with hypotensive BP response - normal findings on cath with moderate AS noted. AVA of 1.3.   She is doing well today.  She denies any chest pain, SOB, DOE, LE edema,  palpitations or syncope.  Occasionally she will get dizziness when she goes from sitting to standing.  2D echo last week showed moderate aortic stenosis and increased velocity in the descending aorta worrisome for aortic coarctation.   Wt Readings from Last 3 Encounters:  09/17/14 111 lb (50.349 kg)  06/15/14 110 lb (49.896 kg)  05/18/14 114 lb (51.71 kg)     Past Medical History  Diagnosis Date  . Allergy   . Arthritis     In hips  . Asthma   . Hypertension   . Bicuspid aortic valve   . Anemia   . Alcohol abuse, in remission 2012  . Abnormal Pap smear of cervix     --recurrent ascus w/Pos. HR HPV  . Bipolar disorder   . ADD (attention deficit disorder)     Current Outpatient Prescriptions  Medication Sig Dispense Refill  . CALCIUM PO Take 2 tablets by mouth daily.      Marland Kitchen eletriptan (RELPAX) 40 MG tablet Take 40 mg by mouth every 2 (two) hours as needed for migraine or headache. One tablet by mouth at onset of headache. May repeat in 2 hours if headache persists or recurs.      . Fluticasone-Salmeterol (ADVAIR) 250-50 MCG/DOSE AEPB Inhale 1 puff into the lungs 2 (two) times daily as needed (shortness of breath).      . lamoTRIgine (LAMICTAL) 200 MG tablet Take 200 mg by mouth daily.      Marland Kitchen losartan (COZAAR) 100 MG tablet TAKE 1 TABLET BY MOUTH DAILY  30 tablet  1  .  SUMAtriptan-naproxen (TREXIMET) 85-500 MG per tablet Take 1 tablet by mouth every 2 (two) hours as needed for migraine.  10 tablet  5  . traZODone (DESYREL) 50 MG tablet Take 50 mg by mouth 3 (three) times daily as needed for sleep (and anxiety).        No current facility-administered medications for this visit.    Allergies:    Allergies  Allergen Reactions  . Demerol Nausea And Vomiting  . Morphine And Related Nausea And Vomiting  . Codeine Itching    Social History:  The patient  reports that she has been smoking Cigarettes.  She has a 6 pack-year smoking history. She has never used smokeless tobacco. She reports that she does not drink alcohol or use illicit drugs.   Family History:  The patient's family history includes Hyperlipidemia in her brother, father, and mother; Hypertension in her brother, father, and mother; Migraines in her father; Thyroid disease in her maternal grandfather, maternal grandmother, and mother. There is no history of Cancer.   ROS:  Please see the history of present illness.      All other systems reviewed and negative.   PHYSICAL EXAM: VS:  BP 104/80  Pulse  73  Ht 5' (1.524 m)  Wt 111 lb (50.349 kg)  BMI 21.68 kg/m2 Well nourished, well developed, in no acute distress HEENT: normal Neck: no JVD Cardiac:  normal S1, S2; RRR; 2/6 SM at RUSB to LLSB and carotids Lungs:  clear to auscultation bilaterally, no wheezing, rhonchi or rales Abd: soft, nontender, no hepatomegaly Ext: no edema Skin: warm and dry Neuro:  CNs 2-12 intact, no focal abnormalities noted  Assessment / Plan:  1. Aortic stenosis - moderate per recent cath and echo last week 2. Smoking - she has cut down to 3-4 cigarettes daily - encouraged her to continue with goal of total cessation  3. HTN - BP low normal  - decrease Losartan to 50mg  daily 4.  Increased velocity in descending aorta ? Coarctation - will get cardiac MRI to rule out 5.  Migraine HA's - she says that the Losartan  helps her HA's.  I think we need to decrease her Losartan due to soft BP and some dizziness so I will drop the dose of her Losartan and refer her to Neuro for eval of migraine HA's  Followup with me in 6 months  2D echo in 1 year   Signed, Fransico Him, MD [facility] 09/17/2014 9:39 AM

## 2014-09-19 ENCOUNTER — Encounter: Payer: Self-pay | Admitting: Cardiology

## 2014-10-01 ENCOUNTER — Encounter: Payer: Self-pay | Admitting: Cardiology

## 2014-10-02 ENCOUNTER — Other Ambulatory Visit: Payer: Self-pay | Admitting: Internal Medicine

## 2014-10-03 ENCOUNTER — Ambulatory Visit (HOSPITAL_COMMUNITY): Admission: RE | Admit: 2014-10-03 | Payer: Managed Care, Other (non HMO) | Source: Ambulatory Visit

## 2014-11-01 ENCOUNTER — Encounter: Payer: Self-pay | Admitting: Cardiology

## 2014-11-07 ENCOUNTER — Encounter: Payer: Self-pay | Admitting: Neurology

## 2014-11-07 ENCOUNTER — Ambulatory Visit (INDEPENDENT_AMBULATORY_CARE_PROVIDER_SITE_OTHER): Payer: Managed Care, Other (non HMO) | Admitting: Neurology

## 2014-11-07 ENCOUNTER — Telehealth: Payer: Self-pay | Admitting: Cardiology

## 2014-11-07 VITALS — BP 104/62 | HR 68 | Temp 98.2°F | Resp 16 | Ht 60.0 in | Wt 116.1 lb

## 2014-11-07 DIAGNOSIS — G4441 Drug-induced headache, not elsewhere classified, intractable: Secondary | ICD-10-CM

## 2014-11-07 DIAGNOSIS — G43719 Chronic migraine without aura, intractable, without status migrainosus: Secondary | ICD-10-CM

## 2014-11-07 DIAGNOSIS — G4440 Drug-induced headache, not elsewhere classified, not intractable: Secondary | ICD-10-CM

## 2014-11-07 DIAGNOSIS — Z720 Tobacco use: Secondary | ICD-10-CM

## 2014-11-07 MED ORDER — TOPIRAMATE 25 MG PO TABS: 25.0000 mg | ORAL_TABLET | Freq: Every day | ORAL | Status: DC

## 2014-11-07 MED ORDER — SUMATRIPTAN SUCCINATE 100 MG PO TABS: ORAL_TABLET | ORAL | Status: DC

## 2014-11-07 NOTE — Telephone Encounter (Signed)
Looked at patient's medication list and encouraged her to take a trazodone before her test. Patient st she cannot get another Rx for her trazodone before seeing prescribing MD, and she has been trying to make an appointment. When asked her concerns, she said the MRI "makes her a little nervous, but last time they gave me Xanax and that helped." Advised patient this will be sent to Dr. Radford Pax for recommendations.

## 2014-11-07 NOTE — Progress Notes (Signed)
NEUROLOGY CONSULTATION NOTE  Danielle Obrien MRN: [id number] DOB: April 30, 1981  Referring provider: Dr. Radford Pax Primary care provider: Dr. Ronnald Ramp  Reason for consult:  Headache  HISTORY OF PRESENT ILLNESS: Danielle Obrien is a 33 year old right-handed woman with aortic stenosis, asthma, hypertension, Bipolar disorder and history of hip surgery who presents for headache.  Records reviewed.  Onset:  73-19 years old (worse over past 2 years) Location:  Bifrontal, radiating up to back of head and down neck into the shoulders Quality:  Pounding, throbbing Intensity:  7/10 (10/10 when severe) Aura:  no Prodrome:  no Associated symptoms:  Photophobia.  Sometimes nausea, vomiting, seeing spots.  On a couple of occasions, associated with epistaxis. Duration:  5-6 days Frequency:  2x/month (12 headache days/month) Triggers/exacerbating factors:  Stress, change in weather Relieving factors:  Ice pack Activity:  Able to force self to function  Past abortive therapy:  Hydrocodone, Tylenol 3, naproxen, ibuprofen, Relpax 40mg  (not effective), Treximet 85-500mg  (effective but insurance won't cover it) Past preventative therapy:  Depakote (for mood, side effects)  Current abortive therapy:  , Tylenol (takes daily around the clock) Current preventative therapy:  none Other medications:  Lamotrigine 200mg , trazodone 50mg , losartan 50mg , Advair  Caffeine:  Coffee and cola daily Alcohol:  no Smoker:  yes Diet:  Eats healthy Exercise:  Walks at least 2x/week Depression/stress:  Stress related to work Wellsite [employer]) Sleep hygiene:  Occasionally a problem due to headache Family history of headache:  Mom, maternal aunt  PAST MEDICAL HISTORY: Past Medical History  Diagnosis Date  . Allergy   . Arthritis     In hips  . Asthma   . Hypertension   . Bicuspid aortic valve   . Anemia   . Alcohol abuse, in remission 2012  . Abnormal Pap smear of cervix     --recurrent ascus w/Pos. HR HPV  .  Bipolar disorder   . ADD (attention deficit disorder)   . Headache     PAST SURGICAL HISTORY: Past Surgical History  Procedure Laterality Date  . Hip surgery  1981    Hip Reset   . Tympanostomy tube placement  1981/1982  . Hernia repair  1981/1982  . Tonsillectomy and adenoidectomy  1990  . Hip surgery  1990    Plate was reconstructed/ took out growth plate in Right knee  . Hip surgery  1992    Plate removed in Left hip  . Anterior cruciate ligament repair  1993  . Lymph node biopsy  1995  . Nasal septum surgery  2002  . Abdominal hysterectomy  02/06/2009    Robotic total laparoscopic hysterectomy  . 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 Colposcopy  10/2008    CIN I & II  . Colposcopy  07/2000    Neg. ECC  . Colposcopy  06/2001    CIN I  . Colposcopy  08/2004    ECC--atypia  . Cardiac catheterization  June 2015    no CAD - moderate AS noted    MEDICATIONS: Current Outpatient Prescriptions on File Prior to Visit  Medication Sig Dispense Refill  . CALCIUM PO Take 2 tablets by mouth daily.    Marland Kitchen eletriptan (RELPAX) 40 MG tablet Take 40 mg by mouth every 2 (two) hours as needed for migraine or headache. One tablet by mouth at onset of headache. May repeat in 2 hours if headache persists or recurs.    Marland Kitchen  Fluticasone-Salmeterol (ADVAIR) 250-50 MCG/DOSE AEPB Inhale 1 puff into the lungs 2 (two) times daily as needed (shortness of breath).    . lamoTRIgine (LAMICTAL) 200 MG tablet Take 200 mg by mouth daily.    Marland Kitchen lamoTRIgine (LAMICTAL) 200 MG tablet TAKE 1 TABLET BY MOUTH TWICE DAILY 180 tablet 0  . losartan (COZAAR) 50 MG tablet Take 1 tablet (50 mg total) by mouth daily. 31 tablet 11  . SUMAtriptan-naproxen (TREXIMET) 85-500 MG per tablet Take 1 tablet by mouth every 2 (two) hours as needed for migraine. 10 tablet 5  . traZODone (DESYREL) 50 MG tablet Take 50 mg by mouth 3 (three) times daily as needed for sleep (and anxiety).       No current facility-administered medications on file prior to visit.    ALLERGIES: Allergies  Allergen Reactions  . Demerol Nausea And Vomiting  . Morphine And Related Nausea And Vomiting  . Codeine Itching    FAMILY HISTORY: Family History  Problem Relation Age of Onset  . Cancer Neg Hx   . Hyperlipidemia Mother   . Hypertension Mother   . Thyroid disease Mother   . Hyperlipidemia Father   . Hypertension Father   . Migraines Father   . Hypertension Brother   . Hyperlipidemia Brother   . Thyroid disease Maternal Grandmother   . Thyroid disease Maternal Grandfather     SOCIAL HISTORY: History   Social History  . Marital Status: Single    Spouse Name: N/A    Number of Children: N/A  . Years of Education: N/A   Occupational History  . Not on file.   Social History Main Topics  . Smoking status: Current Every Day Smoker -- 0.50 packs/day for 12 years    Types: Cigarettes  . Smokeless tobacco: Never Used  . Alcohol Use: No  . Drug Use: No  . Sexual Activity:    Partners: Male    Birth Control/ Protection: Surgical     Comment: Hysterectomy   Other Topics Concern  . Not on file   Social History Narrative    REVIEW OF SYSTEMS: Constitutional: No fevers, chills, or sweats, no generalized fatigue, change in appetite Eyes: No visual changes, double vision, eye pain Ear, nose and throat: No hearing loss, ear pain, nasal congestion, sore throat Cardiovascular: No chest pain, palpitations Respiratory:  No shortness of breath at rest or with exertion, wheezes GastrointestinaI: No nausea, vomiting, diarrhea, abdominal pain, fecal incontinence Genitourinary:  No dysuria, urinary retention or frequency Musculoskeletal:  No neck pain, back pain Integumentary: No rash, pruritus, skin lesions Neurological: as above Psychiatric: No depression, insomnia, anxiety Endocrine: No palpitations, fatigue, diaphoresis, mood swings, change in appetite, change in weight,  increased thirst Hematologic/Lymphatic:  No anemia, purpura, petechiae. Allergic/Immunologic: no itchy/runny eyes, nasal congestion, recent allergic reactions, rashes  PHYSICAL EXAM: Filed Vitals:   11/07/14 0929  BP: 104/62  Pulse: 68  Temp: 98.2 F (36.8 C)  Resp: 16   General: No acute distress Head:  Normocephalic/atraumatic Eyes:  fundi unremarkable, without vessel changes, exudates, hemorrhages or papilledema. Neck: supple, no paraspinal tenderness, full range of motion Back: No paraspinal tenderness Heart: regular rate and rhythm Lungs: Clear to auscultation bilaterally. Vascular: No carotid bruits. Neurological Exam: Mental status: alert and oriented to person, place, and time, recent and remote memory intact, fund of knowledge intact, attention and concentration intact, speech fluent and not dysarthric, language intact. Cranial nerves: CN I: not tested CN II: pupils equal, round and reactive to light, visual  fields intact, fundi unremarkable, without vessel changes, exudates, hemorrhages or papilledema. CN III, IV, VI:  full range of motion, no nystagmus, no ptosis CN V: facial sensation intact CN VII: upper and lower face symmetric CN VIII: hearing intact CN IX, X: gag intact, uvula midline CN XI: sternocleidomastoid and trapezius muscles intact CN XII: tongue midline Bulk & Tone: normal, no fasciculations. Motor:  5/5 throughout Sensation:  Temperature and vibration intact Deep Tendon Reflexes:  2+ throughout, toes downgoing Finger to nose testing:  No dysmetria Heel to shin:  No dysmetria Gait:  Normal station and stride.  Able to turn and walk in tandem. Romberg negative.  IMPRESSION: Chronic migraine without aura Medication-overuse headache Tobacco abuse  PLAN: 1.  Start topiramate (Topamax) 25mg  at bedtime. Side effects discussed.  2.  Stop Tylenol.  Do not take any pain reliever more than 2 days out of the week. 3.  Sumatriptan 100mg  with naproxen  500mg  for abortive therapy.  Treximet was effective but expensive and this combination is the same thing. 4.  Stop caffeine 5.  Smoking cessation counseling given 6.  Call in 4 weeks with update and we can adjust topamax dose if needed.  Follow up in 2 months.  Pharmacologic treatment options are limited.  She is already on antihypertensive medications and even had to decrease dose due to low blood pressure.  With Bipolar disorder, I am not comfortable prescribing antidepressants.  She has already tried Depakote.  Other options include biofeedback, neurofeedback or cognitive behavioral therapy.  She is not quite a candidate for Botox at this time due to medication-overuse.  Thank you for allowing me to take part in the care of this patient.  Metta Clines, DO  CC:  Fransico Him, MD  [name], MD

## 2014-11-07 NOTE — Patient Instructions (Signed)
1.  Start topiramate (Topamax) 25mg  at bedtime.  Possible side effects include: impaired thinking, sedation, paresthesias (numbness and tingling) and weight loss.  It may cause dehydration and there is a small risk for kidney stones, so make sure to stay hydrated with water during the day.  There is also a very small risk for glaucoma, so if you notice any change in your vision while taking this medication, see an ophthalmologist.  There is also a very small risk of possible suicidal ideation, as it the case with all antiepileptic medications.   2.  Stop Tylenol.  Do not take any pain reliever more than 2 days out of the week. 3.  At earliest onset of headache, take sumatriptan 100mg  along with naproxen 500mg  (these two ingredients make up Treximet).  May repeat sumatriptan once in 2 hours if needed (do not repeat naproxen).  Do not take more than 2 days out of the week. 4.  Stop caffeine 5.  Stop smoking 6.  Call in 4 weeks with update and we can adjust topamax dose if needed.  Follow up in 2 months.  Other treatment options include biofeedback, neurofeedback, cognitive behavioral therapy.

## 2014-11-07 NOTE — Telephone Encounter (Signed)
New message      Talk to the nurse.  Pt has an MRI scheduled for tomorrow and she is claustrophobic.  Please call

## 2014-11-08 ENCOUNTER — Encounter (HOSPITAL_COMMUNITY): Payer: Self-pay | Admitting: Interventional Cardiology

## 2014-11-08 ENCOUNTER — Other Ambulatory Visit: Payer: Self-pay

## 2014-11-08 ENCOUNTER — Ambulatory Visit (HOSPITAL_COMMUNITY)
Admission: RE | Admit: 2014-11-08 | Discharge: 2014-11-08 | Disposition: A | Discharge status: Home / Self Care | Payer: Managed Care, Other (non HMO) | Source: Ambulatory Visit | Attending: Cardiology | Admitting: Cardiology

## 2014-11-08 DIAGNOSIS — Q251 Coarctation of aorta: Secondary | ICD-10-CM

## 2014-11-08 LAB — CREATININE, SERUM
CREATININE: 0.75 mg/dL (ref 0.50–1.10)
GFR calc Af Amer: >90 mL/min (ref >90)
GFR calc non Af Amer: >90 mL/min (ref >90)

## 2014-11-08 MED ORDER — ALPRAZOLAM 0.25 MG PO TABS: 0.2500 mg | ORAL_TABLET | Freq: Once | ORAL | Status: DC

## 2014-11-08 MED ORDER — GADOBENATE DIMEGLUMINE 529 MG/ML IV SOLN
17.0000 mL | Freq: Once | INTRAVENOUS | Status: AC | PRN
  Administered 2014-11-08: 17 mL via INTRAVENOUS

## 2014-11-08 NOTE — Telephone Encounter (Signed)
Give her Xanax 0.25mg  with 1 tablet and no refills to take when she gets to MRI

## 2014-11-08 NOTE — Telephone Encounter (Signed)
Informed patient a OTO Xanax has been sent to her pharmacy of choice for pick up. Patient grateful.

## 2014-12-04 ENCOUNTER — Other Ambulatory Visit: Payer: Self-pay | Admitting: Neurology

## 2014-12-28 ENCOUNTER — Encounter: Payer: Self-pay | Admitting: Cardiology

## 2015-01-01 ENCOUNTER — Other Ambulatory Visit: Payer: Self-pay | Admitting: Internal Medicine

## 2015-01-03 ENCOUNTER — Telehealth: Payer: Self-pay | Admitting: *Deleted

## 2015-01-03 DIAGNOSIS — G43001 Migraine without aura, not intractable, with status migrainosus: Secondary | ICD-10-CM

## 2015-01-03 MED ORDER — LAMOTRIGINE 200 MG PO TABS: 200.0000 mg | ORAL_TABLET | Freq: Two times a day (BID) | ORAL | Status: DC

## 2015-01-03 NOTE — Telephone Encounter (Signed)
Called pt no answer LMOM md on refill sent to [facility]...Danielle Obrien

## 2015-01-03 NOTE — Telephone Encounter (Signed)
Left msg on triage stating she has made appt for 01/07/15, but she will be completely out of her lamictal tomorrow. Wanting to see if md would refill enough until she came in for appt...[name]

## 2015-01-03 NOTE — Telephone Encounter (Signed)
C-

## 2015-01-03 NOTE — Telephone Encounter (Signed)
done

## 2015-01-05 ENCOUNTER — Other Ambulatory Visit: Payer: Self-pay | Admitting: Neurology

## 2015-01-07 ENCOUNTER — Encounter: Payer: Self-pay | Admitting: Internal Medicine

## 2015-01-07 ENCOUNTER — Ambulatory Visit (INDEPENDENT_AMBULATORY_CARE_PROVIDER_SITE_OTHER): Payer: Managed Care, Other (non HMO) | Admitting: Internal Medicine

## 2015-01-07 ENCOUNTER — Other Ambulatory Visit (INDEPENDENT_AMBULATORY_CARE_PROVIDER_SITE_OTHER): Payer: Managed Care, Other (non HMO)

## 2015-01-07 VITALS — BP 112/72 | HR 74 | Temp 97.9°F | Wt 114.0 lb

## 2015-01-07 DIAGNOSIS — F3110 Bipolar disorder, current episode manic without psychotic features, unspecified: Secondary | ICD-10-CM

## 2015-01-07 DIAGNOSIS — M25559 Pain in unspecified hip: Secondary | ICD-10-CM | POA: Insufficient documentation

## 2015-01-07 DIAGNOSIS — Z0000 Encounter for general adult medical examination without abnormal findings: Secondary | ICD-10-CM

## 2015-01-07 DIAGNOSIS — E785 Hyperlipidemia, unspecified: Secondary | ICD-10-CM

## 2015-01-07 DIAGNOSIS — F1210 Cannabis abuse, uncomplicated: Secondary | ICD-10-CM

## 2015-01-07 DIAGNOSIS — E784 Other hyperlipidemia: Secondary | ICD-10-CM

## 2015-01-07 DIAGNOSIS — I10 Essential (primary) hypertension: Secondary | ICD-10-CM

## 2015-01-07 LAB — LIPID PANEL
Cholesterol: 282 mg/dL — ABNORMAL HIGH (ref 0–200)
HDL: 89.2 mg/dL (ref >39.00)
LDL CALC: 184 mg/dL — AB (ref 0–99)
NonHDL: 192.8
TRIGLYCERIDES: 44 mg/dL (ref 0.0–149.0)
Total CHOL/HDL Ratio: 3
VLDL: 8.8 mg/dL (ref 0.0–40.0)

## 2015-01-07 LAB — COMPREHENSIVE METABOLIC PANEL
ALBUMIN: 5 g/dL (ref 3.5–5.2)
ALK PHOS: 103 U/L (ref 39–117)
ALT: 18 U/L (ref 0–35)
AST: 21 U/L (ref 0–37)
BUN: 17 mg/dL (ref 6–23)
CALCIUM: 9.8 mg/dL (ref 8.4–10.5)
CO2: 27 meq/L (ref 19–32)
CREATININE: 0.92 mg/dL (ref 0.40–1.20)
Chloride: 105 mEq/L (ref 96–112)
GFR: 73.98 mL/min (ref >60.00)
GLUCOSE: 86 mg/dL (ref 70–99)
Potassium: 4.2 mEq/L (ref 3.5–5.1)
SODIUM: 139 meq/L (ref 135–145)
Total Bilirubin: 0.5 mg/dL (ref 0.2–1.2)
Total Protein: 7.7 g/dL (ref 6.0–8.3)

## 2015-01-07 LAB — CBC WITH DIFFERENTIAL/PLATELET
BASOS ABS: 0.1 10*3/uL (ref 0.0–0.1)
BASOS PCT: 0.9 % (ref 0.0–3.0)
Eosinophils Absolute: 0.2 10*3/uL (ref 0.0–0.7)
Eosinophils Relative: 3.1 % (ref 0.0–5.0)
HCT: 44 % (ref 36.0–46.0)
Hemoglobin: 15.3 g/dL — ABNORMAL HIGH (ref 12.0–15.0)
LYMPHS PCT: 36.5 % (ref 12.0–46.0)
Lymphs Abs: 2.4 10*3/uL (ref 0.7–4.0)
MCHC: 34.7 g/dL (ref 30.0–36.0)
MCV: 89.1 fl (ref 78.0–100.0)
Monocytes Absolute: 0.5 10*3/uL (ref 0.1–1.0)
Monocytes Relative: 6.8 % (ref 3.0–12.0)
NEUTROS ABS: 3.5 10*3/uL (ref 1.4–7.7)
Neutrophils Relative %: 52.7 % (ref 43.0–77.0)
Platelets: 179 10*3/uL (ref 150.0–400.0)
RBC: 4.94 Mil/uL (ref 3.87–5.11)
RDW: 13.2 % (ref 11.5–15.5)
WBC: 6.7 10*3/uL (ref 4.0–10.5)

## 2015-01-07 LAB — URINALYSIS, ROUTINE W REFLEX MICROSCOPIC
BILIRUBIN URINE: NEGATIVE
Hgb urine dipstick: NEGATIVE
Ketones, ur: NEGATIVE
LEUKOCYTES UA: NEGATIVE
NITRITE: NEGATIVE
RBC / HPF: NONE SEEN (ref 0–?)
Total Protein, Urine: NEGATIVE
URINE GLUCOSE: NEGATIVE
Urobilinogen, UA: 0.2 (ref 0.0–1.0)
WBC UA: NONE SEEN (ref 0–?)
pH: 6 (ref 5.0–8.0)

## 2015-01-07 LAB — TSH: TSH: 0.8 u[IU]/mL (ref 0.35–4.50)

## 2015-01-07 MED ORDER — HYDROCODONE-ACETAMINOPHEN 5-325 MG PO TABS: 1.0000 | ORAL_TABLET | Freq: Four times a day (QID) | ORAL | Status: DC | PRN

## 2015-01-07 NOTE — Progress Notes (Signed)
Subjective:    Patient ID: [name], female    DOB: 10-25-1981, 34 y.o.   MRN: [id number]  Hypertension This is a chronic problem. The current episode started more than 1 year ago. The problem is unchanged. The problem is controlled. Pertinent negatives include no anxiety, blurred vision, chest pain, headaches, malaise/fatigue, neck pain, orthopnea, palpitations, peripheral edema, PND, shortness of breath or sweats. Past treatments include angiotensin blockers. The current treatment provides significant improvement. There are no compliance problems.       Review of Systems  Constitutional: Negative.  Negative for fever, chills, malaise/fatigue, diaphoresis, appetite change and fatigue.  HENT: Negative.   Eyes: Negative.  Negative for blurred vision.  Respiratory: Negative.  Negative for shortness of breath.   Cardiovascular: Negative.  Negative for chest pain, palpitations, orthopnea, leg swelling and PND.  Gastrointestinal: Negative.  Negative for nausea, vomiting, abdominal pain, diarrhea and constipation.  Endocrine: Negative.   Genitourinary: Negative.   Musculoskeletal: Positive for arthralgias. Negative for back pain, joint swelling and neck pain.       She has chronic bilateral hip pain, she requests a refill on norco, and a referral to see a specialist.  Skin: Negative.  Negative for rash.  Allergic/Immunologic: Negative.   Neurological: Negative.  Negative for headaches.  Hematological: Negative.  Negative for adenopathy. Does not bruise/bleed easily.  Psychiatric/Behavioral: Negative.  Negative for sleep disturbance, dysphoric mood, decreased concentration and agitation. The patient is not nervous/anxious and is not hyperactive.        Objective:   Physical Exam  Constitutional: She is oriented to person, place, and time. She appears well-developed and well-nourished. No distress.  HENT:  Head: Normocephalic and atraumatic.  Mouth/Throat: Oropharynx is clear and  moist. No oropharyngeal exudate.  Eyes: Conjunctivae are normal. Right eye exhibits no discharge. Left eye exhibits no discharge. No scleral icterus.  Neck: Normal range of motion. Neck supple. No JVD present. No tracheal deviation present. No thyromegaly present.  Cardiovascular: Normal rate, regular rhythm, S1 normal, S2 normal and intact distal pulses.  Exam reveals no gallop.   Murmur heard.  Decrescendo systolic murmur is present with a grade of 2/6   No diastolic murmur is present  Pulmonary/Chest: Effort normal and breath sounds normal. No stridor. No respiratory distress. She has no wheezes. She has no rales. She exhibits no tenderness.  Abdominal: Soft. Bowel sounds are normal. She exhibits no distension. There is no tenderness. There is no rebound and no guarding.  Musculoskeletal: Normal range of motion. She exhibits no edema or tenderness.  Lymphadenopathy:    She has no cervical adenopathy.  Neurological: She is oriented to person, place, and time.  Skin: Skin is warm and dry. No rash noted. She is not diaphoretic. No erythema. No pallor.  Psychiatric: She has a normal mood and affect. Her behavior is normal. Judgment and thought content normal.  Vitals reviewed.    Lab Results  Component Value Date   WBC 6.7 01/07/2015   HGB 15.3* 01/07/2015   HCT 44.0 01/07/2015   PLT 179.0 01/07/2015   GLUCOSE 86 01/07/2015   CHOL 282* 01/07/2015   TRIG 44.0 01/07/2015   HDL 89.20 01/07/2015   LDLDIRECT 171.0 01/27/2013   LDLCALC 184* 01/07/2015   ALT 18 01/07/2015   AST 21 01/07/2015   NA 139 01/07/2015   K 4.2 01/07/2015   CL 105 01/07/2015   CREATININE 0.92 01/07/2015   BUN 17 01/07/2015   CO2 27 01/07/2015   TSH  0.80 01/07/2015   INR 0.9 05/15/2014       Assessment & Plan:

## 2015-01-07 NOTE — Assessment & Plan Note (Signed)
Her LDL is moderately elevated but she does not want to take a statin Will follow for now

## 2015-01-07 NOTE — Progress Notes (Signed)
Pre visit review using our clinic review tool, if applicable. No additional management support is needed unless otherwise documented below in the visit note. 

## 2015-01-07 NOTE — Assessment & Plan Note (Signed)
Her BP is well controlled Lytes and renal function are stable 

## 2015-01-07 NOTE — Assessment & Plan Note (Signed)
She is doing well on lamictal Will check a UDS today to screen for substance abuse

## 2015-01-07 NOTE — Assessment & Plan Note (Signed)
Will monitor her UDS today for substance abuse She was given a refill on norco and a referral to sports medicine

## 2015-01-07 NOTE — Assessment & Plan Note (Signed)
Vaccines were reviewed and updated Her PAP is UTD Exam done Labs ordered

## 2015-01-07 NOTE — Patient Instructions (Signed)
Preventive Care for Adults A healthy lifestyle and preventive care can promote health and wellness. Preventive health guidelines for women include the following key practices.  A routine yearly physical is a good way to check with your health care provider about your health and preventive screening. It is a chance to share any concerns and updates on your health and to receive a thorough exam.  Visit your dentist for a routine exam and preventive care every 6 months. Brush your teeth twice a day and floss once a day. Good oral hygiene prevents tooth decay and gum disease.  The frequency of eye exams is based on your age, health, family medical history, use of contact lenses, and other factors. Follow your health care provider's recommendations for frequency of eye exams.  Eat a healthy diet. Foods like vegetables, fruits, whole grains, low-fat dairy products, and lean protein foods contain the nutrients you need without too many calories. Decrease your intake of foods high in solid fats, added sugars, and salt. Eat the right amount of calories for you.Get information about a proper diet from your health care provider, if necessary.  Regular physical exercise is one of the most important things you can do for your health. Most adults should get at least 150 minutes of moderate-intensity exercise (any activity that increases your heart rate and causes you to sweat) each week. In addition, most adults need muscle-strengthening exercises on 2 or more days a week.  Maintain a healthy weight. The body mass index (BMI) is a screening tool to identify possible weight problems. It provides an estimate of body fat based on height and weight. Your health care provider can find your BMI and can help you achieve or maintain a healthy weight.For adults 20 years and older:  A BMI below 18.5 is considered underweight.  A BMI of 18.5 to 24.9 is normal.  A BMI of 25 to 29.9 is considered overweight.  A BMI of  30 and above is considered obese.  Maintain normal blood lipids and cholesterol levels by exercising and minimizing your intake of saturated fat. Eat a balanced diet with plenty of fruit and vegetables. Blood tests for lipids and cholesterol should begin at age 76 and be repeated every 5 years. If your lipid or cholesterol levels are high, you are over 50, or you are at high risk for heart disease, you may need your cholesterol levels checked more frequently.Ongoing high lipid and cholesterol levels should be treated with medicines if diet and exercise are not working.  If you smoke, find out from your health care provider how to quit. If you do not use tobacco, do not start.  Lung cancer screening is recommended for adults aged 22-80 years who are at high risk for developing lung cancer because of a history of smoking. A yearly low-dose CT scan of the lungs is recommended for people who have at least a 30-pack-year history of smoking and are a current smoker or have quit within the past 15 years. A pack year of smoking is smoking an average of 1 pack of cigarettes a day for 1 year (for example: 1 pack a day for 30 years or 2 packs a day for 15 years). Yearly screening should continue until the smoker has stopped smoking for at least 15 years. Yearly screening should be stopped for people who develop a health problem that would prevent them from having lung cancer treatment.  If you are pregnant, do not drink alcohol. If you are breastfeeding,  be very cautious about drinking alcohol. If you are not pregnant and choose to drink alcohol, do not have more than 1 drink per day. One drink is considered to be 12 ounces (355 mL) of beer, 5 ounces (148 mL) of wine, or 1.5 ounces (44 mL) of liquor.  Avoid use of street drugs. Do not share needles with anyone. Ask for help if you need support or instructions about stopping the use of drugs.  High blood pressure causes heart disease and increases the risk of  stroke. Your blood pressure should be checked at least every 1 to 2 years. Ongoing high blood pressure should be treated with medicines if weight loss and exercise do not work.  If you are 3-86 years old, ask your health care provider if you should take aspirin to prevent strokes.  Diabetes screening involves taking a blood sample to check your fasting blood sugar level. This should be done once every 3 years, after age 67, if you are within normal weight and without risk factors for diabetes. Testing should be considered at a younger age or be carried out more frequently if you are overweight and have at least 1 risk factor for diabetes.  Breast cancer screening is essential preventive care for women. You should practice "breast self-awareness." This means understanding the normal appearance and feel of your breasts and may include breast self-examination. Any changes detected, no matter how small, should be reported to a health care provider. Women in their 8s and 30s should have a clinical breast exam (CBE) by a health care provider as part of a regular health exam every 1 to 3 years. After age 70, women should have a CBE every year. Starting at age 25, women should consider having a mammogram (breast X-ray test) every year. Women who have a family history of breast cancer should talk to their health care provider about genetic screening. Women at a high risk of breast cancer should talk to their health care providers about having an MRI and a mammogram every year.  Breast cancer gene (BRCA)-related cancer risk assessment is recommended for women who have family members with BRCA-related cancers. BRCA-related cancers include breast, ovarian, tubal, and peritoneal cancers. Having family members with these cancers may be associated with an increased risk for harmful changes (mutations) in the breast cancer genes BRCA1 and BRCA2. Results of the assessment will determine the need for genetic counseling and  BRCA1 and BRCA2 testing.  Routine pelvic exams to screen for cancer are no longer recommended for nonpregnant women who are considered low risk for cancer of the pelvic organs (ovaries, uterus, and vagina) and who do not have symptoms. Ask your health care provider if a screening pelvic exam is right for you.  If you have had past treatment for cervical cancer or a condition that could lead to cancer, you need Pap tests and screening for cancer for at least 20 years after your treatment. If Pap tests have been discontinued, your risk factors (such as having a new sexual partner) need to be reassessed to determine if screening should be resumed. Some women have medical problems that increase the chance of getting cervical cancer. In these cases, your health care provider may recommend more frequent screening and Pap tests.  The HPV test is an additional test that may be used for cervical cancer screening. The HPV test looks for the virus that can cause the cell changes on the cervix. The cells collected during the Pap test can be  tested for HPV. The HPV test could be used to screen women aged 30 years and older, and should be used in women of any age who have unclear Pap test results. After the age of 30, women should have HPV testing at the same frequency as a Pap test.  Colorectal cancer can be detected and often prevented. Most routine colorectal cancer screening begins at the age of 50 years and continues through age 75 years. However, your health care provider may recommend screening at an earlier age if you have risk factors for colon cancer. On a yearly basis, your health care provider may provide home test kits to check for hidden blood in the stool. Use of a small camera at the end of a tube, to directly examine the colon (sigmoidoscopy or colonoscopy), can detect the earliest forms of colorectal cancer. Talk to your health care provider about this at age 50, when routine screening begins. Direct  exam of the colon should be repeated every 5-10 years through age 75 years, unless early forms of pre-cancerous polyps or small growths are found.  People who are at an increased risk for hepatitis B should be screened for this virus. You are considered at high risk for hepatitis B if:  You were born in a country where hepatitis B occurs often. Talk with your health care provider about which countries are considered high risk.  Your parents were born in a high-risk country and you have not received a shot to protect against hepatitis B (hepatitis B vaccine).  You have HIV or AIDS.  You use needles to inject street drugs.  You live with, or have sex with, someone who has hepatitis B.  You get hemodialysis treatment.  You take certain medicines for conditions like cancer, organ transplantation, and autoimmune conditions.  Hepatitis C blood testing is recommended for all people born from 1945 through 1965 and any individual with known risks for hepatitis C.  Practice safe sex. Use condoms and avoid high-risk sexual practices to reduce the spread of sexually transmitted infections (STIs). STIs include gonorrhea, chlamydia, syphilis, trichomonas, herpes, HPV, and human immunodeficiency virus (HIV). Herpes, HIV, and HPV are viral illnesses that have no cure. They can result in disability, cancer, and death.  You should be screened for sexually transmitted illnesses (STIs) including gonorrhea and chlamydia if:  You are sexually active and are younger than 24 years.  You are older than 24 years and your health care provider tells you that you are at risk for this type of infection.  Your sexual activity has changed since you were last screened and you are at an increased risk for chlamydia or gonorrhea. Ask your health care provider if you are at risk.  If you are at risk of being infected with HIV, it is recommended that you take a prescription medicine daily to prevent HIV infection. This is  called preexposure prophylaxis (PrEP). You are considered at risk if:  You are a heterosexual woman, are sexually active, and are at increased risk for HIV infection.  You take drugs by injection.  You are sexually active with a partner who has HIV.  Talk with your health care provider about whether you are at high risk of being infected with HIV. If you choose to begin PrEP, you should first be tested for HIV. You should then be tested every 3 months for as long as you are taking PrEP.  Osteoporosis is a disease in which the bones lose minerals and strength   with aging. This can result in serious bone fractures or breaks. The risk of osteoporosis can be identified using a bone density scan. Women ages 65 years and over and women at risk for fractures or osteoporosis should discuss screening with their health care providers. Ask your health care provider whether you should take a calcium supplement or vitamin D to reduce the rate of osteoporosis.  Menopause can be associated with physical symptoms and risks. Hormone replacement therapy is available to decrease symptoms and risks. You should talk to your health care provider about whether hormone replacement therapy is right for you.  Use sunscreen. Apply sunscreen liberally and repeatedly throughout the day. You should seek shade when your shadow is shorter than you. Protect yourself by wearing long sleeves, pants, a wide-brimmed hat, and sunglasses year round, whenever you are outdoors.  Once a month, do a whole body skin exam, using a mirror to look at the skin on your back. Tell your health care provider of new moles, moles that have irregular borders, moles that are larger than a pencil eraser, or moles that have changed in shape or color.  Stay current with required vaccines (immunizations).  Influenza vaccine. All adults should be immunized every year.  Tetanus, diphtheria, and acellular pertussis (Td, Tdap) vaccine. Pregnant women should  receive 1 dose of Tdap vaccine during each pregnancy. The dose should be obtained regardless of the length of time since the last dose. Immunization is preferred during the 27th-36th week of gestation. An adult who has not previously received Tdap or who does not know her vaccine status should receive 1 dose of Tdap. This initial dose should be followed by tetanus and diphtheria toxoids (Td) booster doses every 10 years. Adults with an unknown or incomplete history of completing a 3-dose immunization series with Td-containing vaccines should begin or complete a primary immunization series including a Tdap dose. Adults should receive a Td booster every 10 years.  Varicella vaccine. An adult without evidence of immunity to varicella should receive 2 doses or a second dose if she has previously received 1 dose. Pregnant females who do not have evidence of immunity should receive the first dose after pregnancy. This first dose should be obtained before leaving the health care facility. The second dose should be obtained 4-8 weeks after the first dose.  Human papillomavirus (HPV) vaccine. Females aged 13-26 years who have not received the vaccine previously should obtain the 3-dose series. The vaccine is not recommended for use in pregnant females. However, pregnancy testing is not needed before receiving a dose. If a female is found to be pregnant after receiving a dose, no treatment is needed. In that case, the remaining doses should be delayed until after the pregnancy. Immunization is recommended for any person with an immunocompromised condition through the age of 26 years if she did not get any or all doses earlier. During the 3-dose series, the second dose should be obtained 4-8 weeks after the first dose. The third dose should be obtained 24 weeks after the first dose and 16 weeks after the second dose.  Zoster vaccine. One dose is recommended for adults aged 60 years or older unless certain conditions are  present.  Measles, mumps, and rubella (MMR) vaccine. Adults born before 1957 generally are considered immune to measles and mumps. Adults born in 1957 or later should have 1 or more doses of MMR vaccine unless there is a contraindication to the vaccine or there is laboratory evidence of immunity to   each of the three diseases. A routine second dose of MMR vaccine should be obtained at least 28 days after the first dose for students attending postsecondary schools, health care workers, or international travelers. People who received inactivated measles vaccine or an unknown type of measles vaccine during 1963-1967 should receive 2 doses of MMR vaccine. People who received inactivated mumps vaccine or an unknown type of mumps vaccine before 1979 and are at high risk for mumps infection should consider immunization with 2 doses of MMR vaccine. For females of childbearing age, rubella immunity should be determined. If there is no evidence of immunity, females who are not pregnant should be vaccinated. If there is no evidence of immunity, females who are pregnant should delay immunization until after pregnancy. Unvaccinated health care workers born before 1957 who lack laboratory evidence of measles, mumps, or rubella immunity or laboratory confirmation of disease should consider measles and mumps immunization with 2 doses of MMR vaccine or rubella immunization with 1 dose of MMR vaccine.  Pneumococcal 13-valent conjugate (PCV13) vaccine. When indicated, a person who is uncertain of her immunization history and has no record of immunization should receive the PCV13 vaccine. An adult aged 19 years or older who has certain medical conditions and has not been previously immunized should receive 1 dose of PCV13 vaccine. This PCV13 should be followed with a dose of pneumococcal polysaccharide (PPSV23) vaccine. The PPSV23 vaccine dose should be obtained at least 8 weeks after the dose of PCV13 vaccine. An adult aged 19  years or older who has certain medical conditions and previously received 1 or more doses of PPSV23 vaccine should receive 1 dose of PCV13. The PCV13 vaccine dose should be obtained 1 or more years after the last PPSV23 vaccine dose.  Pneumococcal polysaccharide (PPSV23) vaccine. When PCV13 is also indicated, PCV13 should be obtained first. All adults aged 65 years and older should be immunized. An adult younger than age 65 years who has certain medical conditions should be immunized. Any person who resides in a nursing home or long-term care facility should be immunized. An adult smoker should be immunized. People with an immunocompromised condition and certain other conditions should receive both PCV13 and PPSV23 vaccines. People with human immunodeficiency virus (HIV) infection should be immunized as soon as possible after diagnosis. Immunization during chemotherapy or radiation therapy should be avoided. Routine use of PPSV23 vaccine is not recommended for American Indians, [place] Natives, or people younger than 65 years unless there are medical conditions that require PPSV23 vaccine. When indicated, people who have unknown immunization and have no record of immunization should receive PPSV23 vaccine. One-time revaccination 5 years after the first dose of PPSV23 is recommended for people aged 19-64 years who have chronic kidney failure, nephrotic syndrome, asplenia, or immunocompromised conditions. People who received 1-2 doses of PPSV23 before age 65 years should receive another dose of PPSV23 vaccine at age 65 years or later if at least 5 years have passed since the previous dose. Doses of PPSV23 are not needed for people immunized with PPSV23 at or after age 65 years.  Meningococcal vaccine. Adults with asplenia or persistent complement component deficiencies should receive 2 doses of quadrivalent meningococcal conjugate (MenACWY-D) vaccine. The doses should be obtained at least 2 months apart.  Microbiologists working with certain meningococcal bacteria, military recruits, people at risk during an outbreak, and people who travel to or live in countries with a high rate of meningitis should be immunized. A first-year college student up through age   21 years who is living in a residence hall should receive a dose if she did not receive a dose on or after her 16th birthday. Adults who have certain high-risk conditions should receive one or more doses of vaccine.  Hepatitis A vaccine. Adults who wish to be protected from this disease, have certain high-risk conditions, work with hepatitis A-infected animals, work in hepatitis A research labs, or travel to or work in countries with a high rate of hepatitis A should be immunized. Adults who were previously unvaccinated and who anticipate close contact with an international adoptee during the first 60 days after arrival in the [place] from a country with a high rate of hepatitis A should be immunized.  Hepatitis B vaccine. Adults who wish to be protected from this disease, have certain high-risk conditions, may be exposed to blood or other infectious body fluids, are household contacts or sex partners of hepatitis B positive people, are clients or workers in certain care facilities, or travel to or work in countries with a high rate of hepatitis B should be immunized.  Haemophilus influenzae type b (Hib) vaccine. A previously unvaccinated person with asplenia or sickle cell disease or having a scheduled splenectomy should receive 1 dose of Hib vaccine. Regardless of previous immunization, a recipient of a hematopoietic stem cell transplant should receive a 3-dose series 6-12 months after her successful transplant. Hib vaccine is not recommended for adults with HIV infection. Preventive Services / Frequency Ages 64 to 68 years  Blood pressure check.** / Every 1 to 2 years.  Lipid and cholesterol check.** / Every 5 years beginning at age  22.  Clinical breast exam.** / Every 3 years for women in their 88s and 53s.  BRCA-related cancer risk assessment.** / For women who have family members with a BRCA-related cancer (breast, ovarian, tubal, or peritoneal cancers).  Pap test.** / Every 2 years from ages 90 through 51. Every 3 years starting at age 21 through age 56 or 3 with a history of 3 consecutive normal Pap tests.  HPV screening.** / Every 3 years from ages 24 through ages 1 to 46 with a history of 3 consecutive normal Pap tests.  Hepatitis C blood test.** / For any individual with known risks for hepatitis C.  Skin self-exam. / Monthly.  Influenza vaccine. / Every year.  Tetanus, diphtheria, and acellular pertussis (Tdap, Td) vaccine.** / Consult your health care provider. Pregnant women should receive 1 dose of Tdap vaccine during each pregnancy. 1 dose of Td every 10 years.  Varicella vaccine.** / Consult your health care provider. Pregnant females who do not have evidence of immunity should receive the first dose after pregnancy.  HPV vaccine. / 3 doses over 6 months, if 72 and younger. The vaccine is not recommended for use in pregnant females. However, pregnancy testing is not needed before receiving a dose.  Measles, mumps, rubella (MMR) vaccine.** / You need at least 1 dose of MMR if you were born in 1957 or later. You may also need a 2nd dose. For females of childbearing age, rubella immunity should be determined. If there is no evidence of immunity, females who are not pregnant should be vaccinated. If there is no evidence of immunity, females who are pregnant should delay immunization until after pregnancy.  Pneumococcal 13-valent conjugate (PCV13) vaccine.** / Consult your health care provider.  Pneumococcal polysaccharide (PPSV23) vaccine.** / 1 to 2 doses if you smoke cigarettes or if you have certain conditions.  Meningococcal vaccine.** /  1 dose if you are age 19 to 21 years and a first-year college  student living in a residence hall, or have one of several medical conditions, you need to get vaccinated against meningococcal disease. You may also need additional booster doses.  Hepatitis A vaccine.** / Consult your health care provider.  Hepatitis B vaccine.** / Consult your health care provider.  Haemophilus influenzae type b (Hib) vaccine.** / Consult your health care provider. Ages 40 to 64 years  Blood pressure check.** / Every 1 to 2 years.  Lipid and cholesterol check.** / Every 5 years beginning at age 20 years.  Lung cancer screening. / Every year if you are aged 55-80 years and have a 30-pack-year history of smoking and currently smoke or have quit within the past 15 years. Yearly screening is stopped once you have quit smoking for at least 15 years or develop a health problem that would prevent you from having lung cancer treatment.  Clinical breast exam.** / Every year after age 40 years.  BRCA-related cancer risk assessment.** / For women who have family members with a BRCA-related cancer (breast, ovarian, tubal, or peritoneal cancers).  Mammogram.** / Every year beginning at age 40 years and continuing for as long as you are in good health. Consult with your health care provider.  Pap test.** / Every 3 years starting at age 30 years through age 65 or 70 years with a history of 3 consecutive normal Pap tests.  HPV screening.** / Every 3 years from ages 30 years through ages 65 to 70 years with a history of 3 consecutive normal Pap tests.  Fecal occult blood test (FOBT) of stool. / Every year beginning at age 50 years and continuing until age 75 years. You may not need to do this test if you get a colonoscopy every 10 years.  Flexible sigmoidoscopy or colonoscopy.** / Every 5 years for a flexible sigmoidoscopy or every 10 years for a colonoscopy beginning at age 50 years and continuing until age 75 years.  Hepatitis C blood test.** / For all people born from 1945 through  1965 and any individual with known risks for hepatitis C.  Skin self-exam. / Monthly.  Influenza vaccine. / Every year.  Tetanus, diphtheria, and acellular pertussis (Tdap/Td) vaccine.** / Consult your health care provider. Pregnant women should receive 1 dose of Tdap vaccine during each pregnancy. 1 dose of Td every 10 years.  Varicella vaccine.** / Consult your health care provider. Pregnant females who do not have evidence of immunity should receive the first dose after pregnancy.  Zoster vaccine.** / 1 dose for adults aged 60 years or older.  Measles, mumps, rubella (MMR) vaccine.** / You need at least 1 dose of MMR if you were born in 1957 or later. You may also need a 2nd dose. For females of childbearing age, rubella immunity should be determined. If there is no evidence of immunity, females who are not pregnant should be vaccinated. If there is no evidence of immunity, females who are pregnant should delay immunization until after pregnancy.  Pneumococcal 13-valent conjugate (PCV13) vaccine.** / Consult your health care provider.  Pneumococcal polysaccharide (PPSV23) vaccine.** / 1 to 2 doses if you smoke cigarettes or if you have certain conditions.  Meningococcal vaccine.** / Consult your health care provider.  Hepatitis A vaccine.** / Consult your health care provider.  Hepatitis B vaccine.** / Consult your health care provider.  Haemophilus influenzae type b (Hib) vaccine.** / Consult your health care provider. Ages 65   years and over  Blood pressure check.** / Every 1 to 2 years.  Lipid and cholesterol check.** / Every 5 years beginning at age 22 years.  Lung cancer screening. / Every year if you are aged 73-80 years and have a 30-pack-year history of smoking and currently smoke or have quit within the past 15 years. Yearly screening is stopped once you have quit smoking for at least 15 years or develop a health problem that would prevent you from having lung cancer  treatment.  Clinical breast exam.** / Every year after age 4 years.  BRCA-related cancer risk assessment.** / For women who have family members with a BRCA-related cancer (breast, ovarian, tubal, or peritoneal cancers).  Mammogram.** / Every year beginning at age 40 years and continuing for as long as you are in good health. Consult with your health care provider.  Pap test.** / Every 3 years starting at age 9 years through age 34 or 91 years with 3 consecutive normal Pap tests. Testing can be stopped between 65 and 70 years with 3 consecutive normal Pap tests and no abnormal Pap or HPV tests in the past 10 years.  HPV screening.** / Every 3 years from ages 57 years through ages 64 or 45 years with a history of 3 consecutive normal Pap tests. Testing can be stopped between 65 and 70 years with 3 consecutive normal Pap tests and no abnormal Pap or HPV tests in the past 10 years.  Fecal occult blood test (FOBT) of stool. / Every year beginning at age 15 years and continuing until age 17 years. You may not need to do this test if you get a colonoscopy every 10 years.  Flexible sigmoidoscopy or colonoscopy.** / Every 5 years for a flexible sigmoidoscopy or every 10 years for a colonoscopy beginning at age 86 years and continuing until age 71 years.  Hepatitis C blood test.** / For all people born from [birth year] through 1966 and any individual with known risks for hepatitis C.  Osteoporosis screening.** / A one-time screening for women ages 83 years and over and women at risk for fractures or osteoporosis.  Skin self-exam. / Monthly.  Influenza vaccine. / Every year.  Tetanus, diphtheria, and acellular pertussis (Tdap/Td) vaccine.** / 1 dose of Td every 10 years.  Varicella vaccine.** / Consult your health care provider.  Zoster vaccine.** / 1 dose for adults aged 61 years or older.  Pneumococcal 13-valent conjugate (PCV13) vaccine.** / Consult your health care provider.  Pneumococcal  polysaccharide (PPSV23) vaccine.** / 1 dose for all adults aged 28 years and older.  Meningococcal vaccine.** / Consult your health care provider.  Hepatitis A vaccine.** / Consult your health care provider.  Hepatitis B vaccine.** / Consult your health care provider.  Haemophilus influenzae type b (Hib) vaccine.** / Consult your health care provider. ** Family history and personal history of risk and conditions may change your health care provider's recommendations. Document Released: 01/12/2002 Document Revised: 04/02/2014 Document Reviewed: 04/13/2011 [facility] Patient Information 2015 [place], [place]. This information is not intended to replace advice given to you by your health care provider. Make sure you discuss any questions you have with your health care provider.

## 2015-01-09 ENCOUNTER — Telehealth: Payer: Self-pay | Admitting: Neurology

## 2015-01-09 ENCOUNTER — Ambulatory Visit: Payer: Managed Care, Other (non HMO) | Admitting: Neurology

## 2015-01-09 LAB — OXYCODONE SCREEN, UA, RFLX CONFIRM: Oxycodone Screen, Ur: NEGATIVE ng/mL

## 2015-01-09 LAB — DRUGS OF ABUSE SCREEN W/O ALC, ROUTINE URINE
AMPHETAMINE SCRN UR: NEGATIVE
BENZODIAZEPINES.: NEGATIVE
Barbiturate Quant, Ur: NEGATIVE
CREATININE, U: 39.6 mg/dL
Cocaine Metabolites: NEGATIVE
Marijuana Metabolite: POSITIVE — AB
Methadone: NEGATIVE
Opiate Screen, Urine: NEGATIVE
PROPOXYPHENE: NEGATIVE
Phencyclidine (PCP): NEGATIVE

## 2015-01-09 NOTE — Telephone Encounter (Signed)
Pt canceled appt 01-09-15 and will call back to resch

## 2015-01-11 LAB — CANNABANOIDS (GC/LC/MS), URINE: THC-COOH (GC/LC/MS), ur confirm: 38 ng/mL — ABNORMAL HIGH (ref <5)

## 2015-02-20 ENCOUNTER — Encounter: Payer: Self-pay | Admitting: Family Medicine

## 2015-02-20 ENCOUNTER — Ambulatory Visit (INDEPENDENT_AMBULATORY_CARE_PROVIDER_SITE_OTHER): Payer: Managed Care, Other (non HMO) | Admitting: Family Medicine

## 2015-02-20 VITALS — BP 112/84 | HR 82 | Ht 60.0 in | Wt 112.0 lb

## 2015-02-20 DIAGNOSIS — M2170 Unequal limb length (acquired), unspecified site: Secondary | ICD-10-CM | POA: Diagnosis not present

## 2015-02-20 DIAGNOSIS — M533 Sacrococcygeal disorders, not elsewhere classified: Secondary | ICD-10-CM

## 2015-02-20 NOTE — Assessment & Plan Note (Signed)
We'll not correct the entire leg length discrepancy but I do think that decreasing in by a 8th or of a quarter of an inch would be beneficial.

## 2015-02-20 NOTE — Patient Instructions (Addendum)
Very nice to meet you Ice 20 minutes 2 times daily. Usually after activity and before bed. We will get you in orthotics [place] twice daily topically Vitamin D 1000 IU mor ethen you are doing now.  Turmeric 500mg  twice daily Fish oil 2 grams daily Sacroiliac Joint Mobilization and Rehab 1. Work on pretzel stretching, shoulder back and leg draped in front. 3-5 sets, 30 sec.. 2. hip abductor rotations. standing, hip flexion and rotation outward then inward. 3 sets, 15 reps. when can do comfortably, add ankle weights starting at 2 pounds.  3. cross over stretching - shoulder back to ground, same side leg crossover. 3-5 sets for 30 min..  4. rolling up and back knees to chest and rocking. 5. sacral tilt - 5 sets, hold for 5-10 seconds Exercises on wall.  Heel and butt touching.  Raise leg 6 inches and hold 2 seconds.  Down slow for count of 4 seconds.  1 set of 30 reps daily on both sides.  See me again in 4 weeks.

## 2015-02-20 NOTE — Progress Notes (Signed)
Pre visit review using our clinic review tool, if applicable. No additional management support is needed unless otherwise documented below in the visit note. 

## 2015-02-20 NOTE — Assessment & Plan Note (Signed)
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 We will get x-rays today to further evaluate.  In addition of this patient did work with Product/process development scientist today to learn exercises in greater detail. We also will get patient in orthotics in her to her leg length discrepancy that will hopefully be helpful. We discussed icing regimen as well as topical anti-inflammatories. Patient will try some natural supplementations as well. Patient come back and see me again in 3-4 weeks for further evaluation and treatment.

## 2015-02-20 NOTE — Progress Notes (Signed)
[name] [address] [place], [place] [address] Phone: [phone] Subjective:    I'm seeing this patient by the request  of:  [name], MD   CC: Left hip pain  [id number] Danielle Obrien is a 34 y.o. female coming in with complaint of left hip pain. Patient does give a past medical history significant for hip surgery on multiple occasions. 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 Patient needed plating which has been removed as well with the last surgery in 1992. Patient has had a leg length discrepancy since that time which unfortunately has given her some mild discomfort. Patient states that it seems to be worsening mostly on the posterior aspect of the hip where the hip meets the back bilaterally left greater than right. Denies any numbness going down the legs or any weakness. Still able to do daily activities but has had increasing fatigue and pain. Patient states finding a comfortable spot sometimes can be hard. Patient has to walk on her toes sometimes on her left leg to help compensate for the leg length. Patient is wanting to slow down the progression     Past medical history, social, surgical and family history all reviewed in electronic medical record.   Review of Systems: No headache, visual changes, nausea, vomiting, diarrhea, constipation, dizziness, abdominal pain, skin rash, fevers, chills, night sweats, weight loss, swollen lymph nodes, body aches, joint swelling, muscle aches, chest pain, shortness of breath, mood changes.   Objective Blood pressure 112/84, pulse 82, height 5' (1.524 m), weight 112 lb (50.803 kg), SpO2 97 %.  General: No apparent distress alert and oriented x3 mood and affect normal, dressed appropriately.  HEENT: Pupils equal, extraocular movements intact  Respiratory: Patient's speak in full sentences and does not appear short of breath  Cardiovascular: No lower extremity edema,  non tender, no erythema  Skin: Warm dry intact with no signs of infection or rash on extremities or on axial skeleton.  Abdomen: Soft nontender  Neuro: Cranial nerves II through XII are intact, neurovascularly intact in all extremities with 2+ DTRs and 2+ pulses.  Lymph: No lymphadenopathy of posterior or anterior cervical chain or axillae bilaterally.  Gait normal with good balance and coordination.  MSK:  Non tender with full range of motion and good stability and symmetric strength and tone of shoulders, elbows, wrist, , knee and ankles bilaterally.  Back exam shows that patient does have some mild pictures scoliosis of the lumbar spine. Patient does have a leg length discrepancy of a inch and a half on the left side being shorter. Patient's left-sided SI joint is tender to palpation in does have a positive Faber test. Neurovascularly intact distally with a negative straight leg test. Full strength.  Foot exam shows the patient does have overpronation of the right foot as well as oversupination of the hindfoot. Patient Mild bunion and bunionette formation of the feet bilaterally.  Procedure note 50093; 15 minutes spent for Therapeutic exercises as stated in above notes.  This included exercises focusing on stretching, strengthening, with significant focus on eccentric aspects.  Exercises included Sacroiliac Joint Mobilization and Rehab 1. Work on pretzel stretching, shoulder back and leg draped in front. 3-5 sets, 30 sec.. 2. hip abductor rotations. standing, hip flexion and rotation outward then inward. 3 sets, 15 reps. when can do comfortably, add ankle weights starting at 2 pounds.  3. cross over stretching - shoulder back to ground,  same side leg crossover. 3-5 sets for 30 min..  4. rolling up and back knees to chest and rocking. 5. sacral tilt - 5 sets, hold for 5-10 seconds   Proper technique shown and discussed handout in great detail with ATC.  All questions were discussed and  answered.      Impression and Recommendations:     This case required medical decision making of moderate complexity.

## 2015-03-04 ENCOUNTER — Telehealth: Payer: Self-pay | Admitting: Family Medicine

## 2015-03-04 NOTE — Telephone Encounter (Signed)
Received medical records from [facility] sent to Dr. Tamala Julian. 03/04/15/ss

## 2015-03-07 ENCOUNTER — Ambulatory Visit (INDEPENDENT_AMBULATORY_CARE_PROVIDER_SITE_OTHER): Payer: Managed Care, Other (non HMO) | Admitting: Family Medicine

## 2015-03-07 DIAGNOSIS — M2170 Unequal limb length (acquired), unspecified site: Secondary | ICD-10-CM | POA: Diagnosis not present

## 2015-03-07 NOTE — Progress Notes (Signed)
Patient was fitted for a : standard, cushioned, semi-rigid orthotic. The orthotic was heated and afterward the patient was in a seated position and the orthotic molded. The patient was positioned in subtalar neutral position and 10 degrees of ankle dorsiflexion in a non-weight bearing stance. After completion of molding, patient did have orthotic management which included instructions on acclimating to the orthotics, signs of ill fit as well as care for the orthotic. In total 62minutes were spent fitting the orthotic and discussing care and fit.   The blank was ground to a stable position for weight bearing. Size: 7.5 (Igli Active)  Base: Carbon fiber Additional Posting and Padding: The following postings were fitted onto the molded orthotics to help maintain a talar neutral position - Wedge posting for transverse arch: On left side 200/50, no posting to right side because of swelling near metatarsals       Silicone posting for longitudinal arch: 250/100 bilaterally  The patient ambulated these, and they were very comfortable and supportive. She felt an immediate improvement in how her hip felt with less pulling.

## 2015-03-07 NOTE — Patient Instructions (Signed)

## 2015-03-08 ENCOUNTER — Encounter: Payer: Self-pay | Admitting: Family Medicine

## 2015-03-08 NOTE — Assessment & Plan Note (Signed)
given second pair due to needed better fit for other shoes, knows how to wear and increase slowly, discussed possible need for further intervention, f/u in 2-4 weeks.

## 2015-03-12 ENCOUNTER — Ambulatory Visit: Payer: Managed Care, Other (non HMO) | Admitting: Family Medicine

## 2015-03-25 ENCOUNTER — Ambulatory Visit: Payer: Managed Care, Other (non HMO) | Admitting: Family Medicine

## 2015-03-26 ENCOUNTER — Other Ambulatory Visit: Payer: Self-pay | Admitting: Internal Medicine

## 2015-04-10 ENCOUNTER — Encounter: Payer: Self-pay | Admitting: Family Medicine

## 2015-04-10 ENCOUNTER — Ambulatory Visit (INDEPENDENT_AMBULATORY_CARE_PROVIDER_SITE_OTHER): Payer: Managed Care, Other (non HMO) | Admitting: Family Medicine

## 2015-04-10 ENCOUNTER — Encounter: Payer: Self-pay | Admitting: *Deleted

## 2015-04-10 VITALS — BP 122/80 | HR 72 | Ht 60.0 in | Wt 113.0 lb

## 2015-04-10 DIAGNOSIS — M533 Sacrococcygeal disorders, not elsewhere classified: Secondary | ICD-10-CM

## 2015-04-10 DIAGNOSIS — M999 Biomechanical lesion, unspecified: Secondary | ICD-10-CM | POA: Insufficient documentation

## 2015-04-10 DIAGNOSIS — M9904 Segmental and somatic dysfunction of sacral region: Secondary | ICD-10-CM | POA: Diagnosis not present

## 2015-04-10 MED ORDER — TRAMADOL HCL 50 MG PO TABS: 50.0000 mg | ORAL_TABLET | Freq: Two times a day (BID) | ORAL | Status: DC | PRN

## 2015-04-10 NOTE — Assessment & Plan Note (Signed)
Patient continues to have more of a sacroiliac joint dysfunction that I think is secondary to her previous surgeries. Think this will continue to give her some problems. Patient did respond somewhat well to osteopathic manipulation today. We discussed continuing the orthotics and some changes were made. We are not going to correct patient's total leg length discrepancy. Patient was given a prescription for tramadol but will avoid anything stronger. Patient needs more pain management we'll consider injection in the SI joint versus potentially referral to pain management. If patient continues to have pain to do think repeat imaging will be necessary.

## 2015-04-10 NOTE — Assessment & Plan Note (Signed)
Decision today to treat with OMT was based on Physical Exam  After verbal consent patient was treated with HVLA, ME techniques in thoracic, lumbar and sacral areas  Patient tolerated the procedure well with improvement in symptoms  Patient given exercises, stretches and lifestyle modifications  See medications in patient instructions if given  Patient will follow up in 2-3 weeks          

## 2015-04-10 NOTE — Progress Notes (Signed)
Pre visit review using our clinic review tool, if applicable. No additional management support is needed unless otherwise documented below in the visit note. 

## 2015-04-10 NOTE — Progress Notes (Signed)
  [name] [address] [place], [place] [address] Phone: [phone] Subjective:    I'm seeing this patient by the request  of:  [name], MD   CC: Left hip pain  [id number] Danielle Obrien is a 34 y.o. female coming in with complaint of left hip pain. Patient does give a past medical history significant for hip surgery on multiple occasions. 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 Patient needed plating which has been removed as well with the last surgery in 1992. Patient has had a leg length discrepancy since that time .    Patient was seen previously and was given custom orthotics and has been trying to change her leg length discrepancy over the course of time. Patient states that she has been doing somewhat better. Starting have more no aching pain in the groin though. Patient states that her back overall seems to be doing relatively well but does have pain over the sacroiliac joint. Patient states usually she's been doing better but when the pain occurs it is still severe and Tylenol does not seem to be helping.    Past medical history, social, surgical and family history all reviewed in electronic medical record.   Review of Systems: No headache, visual changes, nausea, vomiting, diarrhea, constipation, dizziness, abdominal pain, skin rash, fevers, chills, night sweats, weight loss, swollen lymph nodes, body aches, joint swelling, muscle aches, chest pain, shortness of breath, mood changes.   Objective Blood pressure 122/80, pulse 72, height 5' (1.524 m), weight 113 lb (51.256 kg), SpO2 97 %.  General: No apparent distress alert and oriented x3 mood and affect normal, dressed appropriately.  HEENT: Pupils equal, extraocular movements intact  Respiratory: Patient's speak in full sentences and does not appear short of breath  Cardiovascular: No lower extremity edema, non tender, no erythema  Skin: Warm dry  intact with no signs of infection or rash on extremities or on axial skeleton.  Abdomen: Soft nontender  Neuro: Cranial nerves II through XII are intact, neurovascularly intact in all extremities with 2+ DTRs and 2+ pulses.  Lymph: No lymphadenopathy of posterior or anterior cervical chain or axillae bilaterally.  Gait normal with good balance and coordination.  MSK:  Non tender with full range of motion and good stability and symmetric strength and tone of shoulders, elbows, wrist, , knee and ankles bilaterally.  Back exam shows that patient does have some mild pictures scoliosis of the lumbar spine. Patient does have a leg length discrepancy of a inch and a half on the left side being shorter. Patient's left-sided SI joint is tender to palpation in does have a positive Faber test. Neurovascularly intact distally with a negative straight leg test. Full strength.  Foot exam shows the patient does have overpronation of the right foot as well as oversupination of the hindfoot. Patient Mild bunion and bunionette formation of the feet bilaterally.  Osteopathic manipulation Lumbar L4 flexed rotated and side bent right Sacrum Left on left Ileum Left anterior   Impression and Recommendations:     This case required medical decision making of moderate complexity.    HVl

## 2015-04-10 NOTE — Patient Instructions (Addendum)
Good to see you Ice is your friend Continue the exercises Look into a shoe cobbler to see if they can make a change.  Come back and see me again in 3 weeks.

## 2015-05-01 ENCOUNTER — Ambulatory Visit: Payer: Managed Care, Other (non HMO) | Admitting: Internal Medicine

## 2015-05-01 ENCOUNTER — Encounter: Payer: Self-pay | Admitting: Family Medicine

## 2015-05-01 ENCOUNTER — Ambulatory Visit (INDEPENDENT_AMBULATORY_CARE_PROVIDER_SITE_OTHER): Payer: Managed Care, Other (non HMO) | Admitting: Family Medicine

## 2015-05-01 VITALS — BP 112/70 | HR 82 | Ht 60.0 in | Wt 116.0 lb

## 2015-05-01 DIAGNOSIS — M533 Sacrococcygeal disorders, not elsewhere classified: Secondary | ICD-10-CM

## 2015-05-01 DIAGNOSIS — M2170 Unequal limb length (acquired), unspecified site: Secondary | ICD-10-CM | POA: Diagnosis not present

## 2015-05-01 NOTE — Progress Notes (Signed)
  [name] [address] [place], [place] [address] Phone: [phone] Subjective:     CC: Left hip pain follow-up  [id number] Danielle Obrien is a 34 y.o. female coming in with complaint of left hip pain. Patient does give a past medical history significant for hip surgery on multiple occasions. 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 Patient needed plating which has been removed as well with the last surgery in 1992. Patient has had a leg length discrepancy since that time .    Patient was seen previously and was given custom orthotics and has been trying to change her leg length discrepancy over the course of time. Patient states that she has been doing somewhat better. States that the back and leg seems to be improving very slowly. Patient also states that the seat seem to be not is fatigued at the end of the day. Patient continues with the Tylenol and over-the-counter medications. Patient was given tramadol for breakthrough pain. Patient states she is doing much better. Patient states that she is 70% better. Patient does not go anywhere without wearing her orthotics. Has noticed that this has made improvement. States that it seems to be a little less pressure on her lower back as well.    Past medical history, social, surgical and family history all reviewed in electronic medical record.   Review of Systems: No headache, visual changes, nausea, vomiting, diarrhea, constipation, dizziness, abdominal pain, skin rash, fevers, chills, night sweats, weight loss, swollen lymph nodes, body aches, joint swelling, muscle aches, chest pain, shortness of breath, mood changes.   Objective Blood pressure 112/70, pulse 82, height 5' (1.524 m), weight 116 lb (52.617 kg), SpO2 98 %.  General: No apparent distress alert and oriented x3 mood and affect normal, dressed appropriately.  HEENT: Pupils equal, extraocular movements intact    Respiratory: Patient's speak in full sentences and does not appear short of breath  Cardiovascular: No lower extremity edema, non tender, no erythema  Skin: Warm dry intact with no signs of infection or rash on extremities or on axial skeleton.  Abdomen: Soft nontender  Neuro: Cranial nerves II through XII are intact, neurovascularly intact in all extremities with 2+ DTRs and 2+ pulses.  Lymph: No lymphadenopathy of posterior or anterior cervical chain or axillae bilaterally.  Gait normal with good balance and coordination.  MSK:  Non tender with full range of motion and good stability and symmetric strength and tone of shoulders, elbows, wrist, , knee and ankles bilaterally.  Back exam shows that patient does have some mild pictures scoliosis of the lumbar spine. Patient does have a leg length discrepancy of a inch and a half on the left side being shorter. Patient's left-sided SI joint is tender to palpation but less than previous exam in does have a positive Faber test. Neurovascularly intact distally with a negative straight leg test. Full strength.  Foot exam shows the patient does have overpronation of the right foot as well as oversupination of the hindfoot. Patient Mild bunion and bunionette formation of the feet bilaterally.    Impression and Recommendations:     This case required medical decision making of moderate complexity.    HVl

## 2015-05-01 NOTE — Patient Instructions (Signed)
Enjoy your day off Ice is your friend Continue the orthotics and try 1/16in ch heel lift from [facility] etc silicone would be better Continue the exercises Continue the vitamins Tramadol as needed See me again in 6 weeks.

## 2015-05-01 NOTE — Progress Notes (Signed)
Pre visit review using our clinic review tool, if applicable. No additional management support is needed unless otherwise documented below in the visit note. 

## 2015-05-01 NOTE — Assessment & Plan Note (Signed)
Vision is doing much better overall. Encourage patient to continue the home exercises and the icing protocol. We also discussed the patient about the importance of the course strength any. Patient will continue with the custom orthotics and patient will add a 1/16 inch heel lift but I do not feel that I will change the leg length discrepancy anymore than this. We discussed continuing the topical anti-inflammatory than patient does have tramadol for breakthrough pain. Patient is feeling much better overall. Patient will come back and see me again in 6 weeks for further evaluation and treatment.  [place]

## 2015-05-01 NOTE — Assessment & Plan Note (Signed)
Added heel lift

## 2015-05-16 ENCOUNTER — Telehealth: Payer: Self-pay | Admitting: Family Medicine

## 2015-05-16 MED ORDER — TRAMADOL HCL 50 MG PO TABS: 50.0000 mg | ORAL_TABLET | Freq: Two times a day (BID) | ORAL | Status: DC | PRN

## 2015-05-16 NOTE — Telephone Encounter (Signed)
Refill done.  

## 2015-05-16 NOTE — Telephone Encounter (Signed)
Patient is requesting tramadol to be sent to [facility] on [address].

## 2015-06-12 ENCOUNTER — Ambulatory Visit: Payer: Managed Care, Other (non HMO) | Admitting: Family Medicine

## 2015-06-13 ENCOUNTER — Telehealth: Payer: Self-pay | Admitting: Family Medicine

## 2015-06-13 MED ORDER — TRAMADOL HCL 50 MG PO TABS: 50.0000 mg | ORAL_TABLET | Freq: Two times a day (BID) | ORAL | Status: DC | PRN

## 2015-06-13 NOTE — Telephone Encounter (Signed)
Patient is requesting a fill of traMADol (ULTRAM) 50 MG tablet [[id number]] sent to [facility] on [address]

## 2015-06-13 NOTE — Telephone Encounter (Signed)
Refill done.  

## 2015-06-22 ENCOUNTER — Other Ambulatory Visit: Payer: Self-pay | Admitting: Internal Medicine

## 2015-07-03 ENCOUNTER — Encounter: Payer: Self-pay | Admitting: Family Medicine

## 2015-07-03 ENCOUNTER — Ambulatory Visit (INDEPENDENT_AMBULATORY_CARE_PROVIDER_SITE_OTHER): Payer: Managed Care, Other (non HMO) | Admitting: Family Medicine

## 2015-07-03 VITALS — BP 118/78 | HR 85 | Ht 60.0 in | Wt 116.0 lb

## 2015-07-03 DIAGNOSIS — M533 Sacrococcygeal disorders, not elsewhere classified: Secondary | ICD-10-CM | POA: Diagnosis not present

## 2015-07-03 MED ORDER — TRAMADOL HCL 50 MG PO TABS: 50.0000 mg | ORAL_TABLET | Freq: Three times a day (TID) | ORAL | Status: DC | PRN

## 2015-07-03 NOTE — Progress Notes (Signed)
Pre visit review using our clinic review tool, if applicable. No additional management support is needed unless otherwise documented below in the visit note. 

## 2015-07-03 NOTE — Patient Instructions (Addendum)
Good to see you Continue with the exercises  New ones for your shoulder 2 times a week Tart cherry extract Take tramadol up to 3 times a day with a tylenol Stay active You decide on the meniscus See me again in 6 weeks.

## 2015-07-03 NOTE — Assessment & Plan Note (Signed)
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 I do not think that any recent digital her symptoms is really occurring at this time. I'm concerned with patient and increasing her tramadol but I did give her a new prescription in 3 times daily but this will be as high as we go. If patient needs any stronger medication she will need to go to a pain management physician. Patient was made aware of this. Patient also seemed to not be as mentally sharp as she has been previously. We discussed this as well. Patient will continue the orthotics to try to help with the leg length discrepancy. Patient will come back and see me again in 6 weeks.  Spent  25 minutes with patient face-to-face and had greater than 50% of counseling including as described above in assessment and plan.

## 2015-07-03 NOTE — Progress Notes (Signed)
  [name] [place], [place] [address] Phone: [phone] Subjective:     CC: Left hip pain follow-up  [id number] Danielle Obrien is a 34 y.o. female coming in with complaint of left hip pain. Patient does give a past medical history significant for hip surgery on multiple occasions. 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 Patient needed plating which has been removed as well with the last surgery in 1992. Patient has had a leg length discrepancy since that time .    Patient was seen previously and was given custom orthotics and has been trying to change her leg length discrepancy over the course of time. Patient states that overall she has been feeling relatively well. Has noticed some heaviness in her legs but would not consider any significant numbness. States that overall she seems to be improving somewhat. Patient does have small her to severe osteophytic changes of the sacroiliac joint as well as somewhat of the lumbar spine. Patient though states that she does not have any significant pain. Patient is taking tramadol 3 times daily almost scheduled. Not taking Tylenol or any of the over-the-counter medications on a regular basis. Patient has been working more averaging approximately 50 hours a week.    Past medical history, social, surgical and family history all reviewed in electronic medical record.   Review of Systems: No headache, visual changes, nausea, vomiting, diarrhea, constipation, dizziness, abdominal pain, skin rash, fevers, chills, night sweats, weight loss, swollen lymph nodes, body aches, joint swelling, muscle aches, chest pain, shortness of breath, mood changes.   Objective Blood pressure 118/78, pulse 85, height 5' (1.524 m), weight 116 lb (52.617 kg), SpO2 97 %.  General: Patient is slurring her words and does have pinpoint pupils HEENT: Pinpoint pupils Respiratory: Patient's speak in  full sentences and does not appear short of breath  Cardiovascular: No lower extremity edema, non tender, no erythema  Skin: Warm dry intact with no signs of infection or rash on extremities or on axial skeleton.  Abdomen: Soft nontender  Neuro: Cranial nerves II through XII are intact, neurovascularly intact in all extremities with 2+ DTRs and 2+ pulses.  Lymph: No lymphadenopathy of posterior or anterior cervical chain or axillae bilaterally.  Gait normal with good balance and coordination.  MSK:  Non tender with full range of motion and good stability and symmetric strength and tone of shoulders, elbows, wrist, , knee and ankles bilaterally.  Back exam shows that patient does have some mild pictures scoliosis of the lumbar spine. Patient does have a leg length discrepancy of a inch and a half on the left side being shorter. Patient's left-sided SI joint is tender to palpation but less than previous exam in does have a positive Faber test. Neurovascularly intact distally with a negative straight leg test. Full strength.  Foot exam shows the patient does have overpronation of the right foot as well as oversupination of the hindfoot. Patient Mild bunion and bunionette formation of the feet bilaterally. No cemented change from previous exam    Impression and Recommendations:     This case required medical decision making of moderate complexity.    HVl

## 2015-08-14 ENCOUNTER — Ambulatory Visit: Payer: Managed Care, Other (non HMO) | Admitting: Family Medicine

## 2015-08-20 ENCOUNTER — Ambulatory Visit (INDEPENDENT_AMBULATORY_CARE_PROVIDER_SITE_OTHER): Payer: Managed Care, Other (non HMO) | Admitting: Family

## 2015-08-20 ENCOUNTER — Encounter: Payer: Self-pay | Admitting: Family

## 2015-08-20 VITALS — BP 112/72 | HR 72 | Temp 98.2°F | Resp 18 | Ht 60.0 in | Wt 115.0 lb

## 2015-08-20 DIAGNOSIS — J0100 Acute maxillary sinusitis, unspecified: Secondary | ICD-10-CM | POA: Diagnosis not present

## 2015-08-20 MED ORDER — HYDROCODONE-HOMATROPINE 5-1.5 MG/5ML PO SYRP: 5.0000 mL | ORAL_SOLUTION | Freq: Three times a day (TID) | ORAL | Status: DC | PRN

## 2015-08-20 MED ORDER — AMOXICILLIN-POT CLAVULANATE 875-125 MG PO TABS: 1.0000 | ORAL_TABLET | Freq: Two times a day (BID) | ORAL | Status: DC

## 2015-08-20 NOTE — Assessment & Plan Note (Signed)
Symptoms and exam are consistent with bacterial sinusitis. Start Augmentin. Start Hycodan as needed for cough and sleep. Continue over the counter medications as needed for symptom relief and supportive care. Follow up if symptoms worsen or fail to improve.

## 2015-08-20 NOTE — Progress Notes (Signed)
Subjective:    Patient ID: Danielle Obrien, female    DOB: 04-07-1981, 34 y.o.   MRN: [id number]  Chief Complaint  Patient presents with  . Sore Throat    x2 weeks, sore throat, ears are hurting, congestion, and a litte bit of a cough    HPI:  Danielle Obrien is a 34 y.o. female who  has a past medical history of Allergy; Arthritis; Asthma; Hypertension; Bicuspid aortic valve; Anemia; Alcohol abuse, in remission (2012); Abnormal Pap smear of cervix; Bipolar disorder; ADD (attention deficit disorder); and Headache. and presents today for an acute office visit.   This is a new problem. Associated symptoms of sore throat, ear pain, congestion, and cough have been going on for approximately 2 weeks. Modifying factors include Tylenol Cold and Sinus which did help with her symptoms and experienced some improvement in her overall course but has then worsened. Reports that she is not sleeping well at night. Denies recent antibiotic use.   Allergies  Allergen Reactions  . Demerol Nausea And Vomiting  . Morphine And Related Nausea And Vomiting  . Codeine Itching     Current Outpatient Prescriptions on File Prior to Visit  Medication Sig Dispense Refill  . CALCIUM PO Take 2 tablets by mouth daily.    . Fluticasone-Salmeterol (ADVAIR) 250-50 MCG/DOSE AEPB Inhale 1 puff into the lungs 2 (two) times daily as needed (shortness of breath).    Marland Kitchen HYDROcodone-acetaminophen (NORCO/VICODIN) 5-325 MG per tablet Take 1 tablet by mouth every 6 (six) hours as needed for moderate pain. 65 tablet 0  . lamoTRIgine (LAMICTAL) 200 MG tablet TAKE 1 TABLET (200 MG TOTAL) BY MOUTH 2 (TWO) TIMES DAILY. 180 tablet 1  . losartan (COZAAR) 100 MG tablet     . SUMAtriptan (IMITREX) 100 MG tablet Take 1tab at earliest onset of headache.  May repeat x1 in 2 hours if headache persists or recurs. 9 tablet 2  . traMADol (ULTRAM) 50 MG tablet Take 1 tablet (50 mg total) by mouth every 8 (eight) hours as needed. 90 tablet 1    . traZODone (DESYREL) 50 MG tablet Take 50 mg by mouth 3 (three) times daily as needed for sleep (and anxiety).      No current facility-administered medications on file prior to visit.     Past Surgical History  Procedure Laterality Date  . Hip surgery  1981    Hip Reset   . Tympanostomy tube placement  1981/1982  . Hernia repair  1981/1982  . Tonsillectomy and adenoidectomy  1990  . Hip surgery  1990    Plate was reconstructed/ took out growth plate in Right knee  . Hip surgery  1992    Plate removed in Left hip  . Anterior cruciate ligament repair  1993  . Lymph node biopsy  1995  . Nasal septum surgery  2002  . Abdominal hysterectomy  02/06/2009    Robotic total laparoscopic hysterectomy  . 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 Colposcopy  10/2008    CIN I & II  . Colposcopy  07/2000    Neg. ECC  . Colposcopy  06/2001    CIN I  . Colposcopy  08/2004    ECC--atypia  . Cardiac catheterization  June 2015    no CAD - moderate AS noted  . Left and right heart catheterization with coronary angiogram N/A 05/18/2014    Procedure: LEFT AND RIGHT  HEART CATHETERIZATION WITH CORONARY ANGIOGRAM;  Surgeon: Jettie Booze, MD;  Location: [facility] CATH LAB;  Service: Cardiovascular;  Laterality: N/A;     Review of Systems  Constitutional: Positive for chills. Negative for fever.  HENT: Positive for congestion, ear pain, sinus pressure and sore throat.   Respiratory: Positive for cough. Negative for chest tightness and shortness of breath.   Neurological: Positive for headaches.      Objective:    BP 112/72 mmHg  Pulse 72  Temp(Src) 98.2 F (36.8 C) (Oral)  Resp 18  Ht 5' (1.524 m)  Wt 115 lb (52.164 kg)  BMI 22.46 kg/m2  SpO2 99% Nursing note and vital signs reviewed.  Physical Exam  Constitutional: She is oriented to person, place, and time. She appears well-developed and well-nourished. No distress.  HENT:  Right Ear:  Hearing, tympanic membrane, external ear and ear canal normal.  Left Ear: Hearing, tympanic membrane, external ear and ear canal normal.  Nose: Right sinus exhibits maxillary sinus tenderness. Right sinus exhibits no frontal sinus tenderness. Left sinus exhibits maxillary sinus tenderness. Left sinus exhibits no frontal sinus tenderness.  Mouth/Throat: Uvula is midline, oropharynx is clear and moist and mucous membranes are normal.  Cardiovascular: Normal rate, regular rhythm, normal heart sounds and intact distal pulses.   Pulmonary/Chest: Effort normal and breath sounds normal.  Neurological: She is alert and oriented to person, place, and time.  Skin: Skin is warm and dry.  Psychiatric: She has a normal mood and affect. Her behavior is normal. Judgment and thought content normal.       Assessment & Plan:   Problem List Items Addressed This Visit      Respiratory   Sinusitis, acute - Primary    Symptoms and exam are consistent with bacterial sinusitis. Start Augmentin. Start Hycodan as needed for cough and sleep. Continue over the counter medications as needed for symptom relief and supportive care. Follow up if symptoms worsen or fail to improve.       Relevant Medications   amoxicillin-clavulanate (AUGMENTIN) 875-125 MG per tablet   HYDROcodone-homatropine (HYCODAN) 5-1.5 MG/5ML syrup

## 2015-08-20 NOTE — Progress Notes (Signed)
Pre visit review using our clinic review tool, if applicable. No additional management support is needed unless otherwise documented below in the visit note. 

## 2015-08-20 NOTE — Patient Instructions (Addendum)
Thank you for choosing [facility].  Summary/Instructions:  Your prescription(s) have been submitted to your pharmacy or been printed and provided for you. Please take as directed and contact our office if you believe you are having problem(s) with the medication(s) or have any questions.  If your symptoms worsen or fail to improve, please contact our office for further instruction, or in case of emergency go directly to the emergency room at the closest medical facility.   General Recommendations:    Please drink plenty of fluids.  Get plenty of rest   Sleep in humidified air  Use saline nasal sprays  Netti pot   OTC Medications:  Decongestants - helps relieve congestion   Flonase (generic fluticasone) or Nasacort (generic triamcinolone) - please make sure to use the "cross-over" technique at a 45 degree angle towards the opposite eye as opposed to straight up the nasal passageway.   Sudafed (generic pseudoephedrine - Note this is the one that is available behind the pharmacy counter); Products with phenylephrine (-PE) may also be used but is often not as effective as pseudoephedrine.   If you have HIGH BLOOD PRESSURE - Coricidin HBP; AVOID any product that is -D as this contains pseudoephedrine which may increase your blood pressure.  Afrin (oxymetazoline) every 6-8 hours for up to 3 days.   Allergies - helps relieve runny nose, itchy eyes and sneezing   Claritin (generic loratidine), Allegra (fexofenidine), or Zyrtec (generic cyrterizine) for runny nose. These medications should not cause drowsiness.  Note - Benadryl (generic diphenhydramine) may be used however may cause drowsiness  Cough -   Delsym or Robitussin (generic dextromethorphan)  Expectorants - helps loosen mucus to ease removal   Mucinex (generic guaifenesin) as directed on the package.  Headaches / General Aches   Tylenol (generic acetaminophen) - DO NOT EXCEED 3 grams (3,000 mg) in a 24  hour time period  Advil/Motrin (generic ibuprofen)   Sore Throat -   Salt water gargle   Chloraseptic (generic benzocaine) spray or lozenges / Sucrets (generic dyclonine)      

## 2015-08-29 ENCOUNTER — Telehealth: Payer: Self-pay | Admitting: Internal Medicine

## 2015-08-29 NOTE — Telephone Encounter (Signed)
She needs to be seen.

## 2015-08-29 NOTE — Telephone Encounter (Signed)
Patient Name: SEHAM Cregger  DOB: 1981/05/09    Initial Comment Caller states c/o sore throat, didn't finish antibiotics for a sinus infection due to a yeast infection   Nurse Assessment  Nurse: Raphael Gibney, RN, Vera Date/Time (Eastern Time): 08/29/2015 9:24:38 AM  Confirm and document reason for call. If symptomatic, describe symptoms. ---Caller states she has a sore throat, cough and nasal congestion. She was prescribed antibiotics for a sinus infection. She got better and did not finish her antibiotics due to a yeast infection. She was prescribed Augmentin and cough medication. Symptoms started 2 days ago. Throat feels like it is on fire. No fever. She is having sinus pain and pressure. OTC vaginal cream helped the yeast infection.  Has the patient traveled out of the country within the last 30 days? ---Not Applicable  Does the patient require triage? ---Yes  Related visit to physician within the last 2 weeks? ---Yes  Does the PT have any chronic conditions? (i.e. diabetes, asthma, etc.) ---No  Did the patient indicate they were pregnant? ---No     Guidelines    Guideline Title Affirmed Question Affirmed Notes  Sore Throat SEVERE (e.g., excruciating) throat pain    Final Disposition User   See Physician within 24 Hours Stringer, RN, Vanita Ingles    Comments  She has to work today and can not come into the office for an appt. Her next day off is not until Monday. She would like a refill on her cough medication and has 5 augmentin left and wants to know about finishing her antibiotics.   Referrals  GO TO FACILITY REFUSED   Disagree/Comply: Disagree  Disagree/Comply Reason: Disagree with instructions

## 2015-08-29 NOTE — Telephone Encounter (Signed)
Please advise 

## 2015-08-30 NOTE — Telephone Encounter (Signed)
appt made

## 2015-08-30 NOTE — Telephone Encounter (Signed)
Can we get this pt on the schedule any provider is fine

## 2015-09-02 ENCOUNTER — Ambulatory Visit (INDEPENDENT_AMBULATORY_CARE_PROVIDER_SITE_OTHER): Payer: Managed Care, Other (non HMO) | Admitting: Family

## 2015-09-02 ENCOUNTER — Encounter: Payer: Self-pay | Admitting: Family

## 2015-09-02 ENCOUNTER — Other Ambulatory Visit: Payer: Self-pay | Admitting: *Deleted

## 2015-09-02 VITALS — BP 118/84 | HR 69 | Temp 98.4°F | Resp 18 | Ht 60.0 in | Wt 114.0 lb

## 2015-09-02 DIAGNOSIS — J0100 Acute maxillary sinusitis, unspecified: Secondary | ICD-10-CM | POA: Diagnosis not present

## 2015-09-02 MED ORDER — FLUCONAZOLE 150 MG PO TABS: 150.0000 mg | ORAL_TABLET | Freq: Once | ORAL | Status: DC

## 2015-09-02 MED ORDER — LEVOFLOXACIN 500 MG PO TABS: 500.0000 mg | ORAL_TABLET | Freq: Every day | ORAL | Status: DC

## 2015-09-02 MED ORDER — TRAMADOL HCL 50 MG PO TABS: 50.0000 mg | ORAL_TABLET | Freq: Three times a day (TID) | ORAL | Status: DC | PRN

## 2015-09-02 MED ORDER — HYDROCODONE-HOMATROPINE 5-1.5 MG/5ML PO SYRP: 5.0000 mL | ORAL_SOLUTION | Freq: Three times a day (TID) | ORAL | Status: DC | PRN

## 2015-09-02 NOTE — Telephone Encounter (Signed)
Refill for tramadol done

## 2015-09-02 NOTE — Assessment & Plan Note (Signed)
Symptoms and exam consistent with recurrent sinusitis secondary to patient's self early completion of her Augmentin and yeast infection. Start levofloxacin. Emphasized importance of completion of antibiotic therapy. Refill hycodan as needed for cough and sleep. Encouraged to discontinue smoking. Continue over the counter medications as needed for symptom relief and supportive care. Follow up if symptoms worsen or fail to improve.

## 2015-09-02 NOTE — Progress Notes (Signed)
Pre visit review using our clinic review tool, if applicable. No additional management support is needed unless otherwise documented below in the visit note. 

## 2015-09-02 NOTE — Progress Notes (Signed)
Subjective:    Patient ID: Danielle Obrien, female    DOB: 1981/09/27, 34 y.o.   MRN: [id number]  Chief Complaint  Patient presents with  . Cough    x2 weeks, congestion, cough, was put on antibiotics that caused a yeast infection so she stopped it and now feels worse    HPI:  Danielle Obrien is a 34 y.o. female who  has a past medical history of Allergy; Arthritis; Asthma; Hypertension; Bicuspid aortic valve; Anemia; Alcohol abuse, in remission (2012); Abnormal Pap smear of cervix; Bipolar disorder ([place]); ADD (attention deficit disorder); and Headache. and presents today for an acute office visit.   Cough - Associated symptom of congestion and cough that has been going on for a total of 2 weeks. Recently seen in the office for sinus infection which improved with Augmentin however she developed a yeast infection and did not complete the medication. Notes that she feels worse at this point. Currently exeperiencing a sore throat and facial pressure that is refractory to the modifying factors of a hot and OTC nyquil which provides some relief for sleep.   Allergies  Allergen Reactions  . Demerol Nausea And Vomiting  . Morphine And Related Nausea And Vomiting  . Codeine Itching     Current Outpatient Prescriptions on File Prior to Visit  Medication Sig Dispense Refill  . CALCIUM PO Take 2 tablets by mouth daily.    . Fluticasone-Salmeterol (ADVAIR) 250-50 MCG/DOSE AEPB Inhale 1 puff into the lungs 2 (two) times daily as needed (shortness of breath).    Marland Kitchen HYDROcodone-acetaminophen (NORCO/VICODIN) 5-325 MG per tablet Take 1 tablet by mouth every 6 (six) hours as needed for moderate pain. 65 tablet 0  . lamoTRIgine (LAMICTAL) 200 MG tablet TAKE 1 TABLET (200 MG TOTAL) BY MOUTH 2 (TWO) TIMES DAILY. 180 tablet 1  . losartan (COZAAR) 100 MG tablet     . SUMAtriptan (IMITREX) 100 MG tablet Take 1tab at earliest onset of headache.  May repeat x1 in 2 hours if headache persists or recurs. 9  tablet 2  . traMADol (ULTRAM) 50 MG tablet Take 1 tablet (50 mg total) by mouth every 8 (eight) hours as needed. 90 tablet 1  . traZODone (DESYREL) 50 MG tablet Take 50 mg by mouth 3 (three) times daily as needed for sleep (and anxiety).      No current facility-administered medications on file prior to visit.     Past Surgical History  Procedure Laterality Date  . Hip surgery  1981    Hip Reset   . Tympanostomy tube placement  1981/1982  . Hernia repair  1981/1982  . Tonsillectomy and adenoidectomy  1990  . Hip surgery  1990    Plate was reconstructed/ took out growth plate in Right knee  . Hip surgery  1992    Plate removed in Left hip  . Anterior cruciate ligament repair  1993  . Lymph node biopsy  1995  . Nasal septum surgery  2002  . Abdominal hysterectomy  02/06/2009    Robotic total laparoscopic hysterectomy  . 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 Colposcopy  10/2008    CIN I & II  . Colposcopy  07/2000    Neg. ECC  . Colposcopy  06/2001    CIN I  . Colposcopy  08/2004    ECC--atypia  . Cardiac catheterization  June 2015    no CAD -  moderate AS noted  . Left and right heart catheterization with coronary angiogram N/A 05/18/2014    Procedure: LEFT AND RIGHT HEART CATHETERIZATION WITH CORONARY ANGIOGRAM;  Surgeon: Jettie Booze, MD;  Location: [facility] CATH LAB;  Service: Cardiovascular;  Laterality: N/A;     Review of Systems  Constitutional: Negative for fever and chills.  HENT: Positive for congestion, facial swelling, sinus pressure and sore throat.   Respiratory: Positive for cough. Negative for chest tightness and shortness of breath.   Neurological: Positive for headaches.      Objective:    BP 118/84 mmHg  Pulse 69  Temp(Src) 98.4 F (36.9 C) (Oral)  Resp 18  Ht 5' (1.524 m)  Wt 114 lb (51.71 kg)  BMI 22.26 kg/m2  SpO2 97% Nursing note and vital signs reviewed.  Physical Exam  Constitutional: She  is oriented to person, place, and time. She appears well-developed and well-nourished. No distress.  HENT:  Right Ear: Hearing, tympanic membrane, external ear and ear canal normal.  Left Ear: Hearing, tympanic membrane, external ear and ear canal normal.  Nose: Right sinus exhibits maxillary sinus tenderness and frontal sinus tenderness. Left sinus exhibits maxillary sinus tenderness and frontal sinus tenderness.  Mouth/Throat: Uvula is midline, oropharynx is clear and moist and mucous membranes are normal.  Neck: Neck supple.  Cardiovascular: Normal rate, regular rhythm and intact distal pulses.   Murmur heard. Pulmonary/Chest: Effort normal and breath sounds normal.  Neurological: She is alert and oriented to person, place, and time.  Skin: Skin is warm and dry.  Psychiatric: She has a normal mood and affect. Her behavior is normal. Judgment and thought content normal.       Assessment & Plan:   Problem List Items Addressed This Visit      Respiratory   Sinusitis, acute - Primary    Symptoms and exam consistent with recurrent sinusitis secondary to patient's self early completion of her Augmentin and yeast infection. Start levofloxacin. Emphasized importance of completion of antibiotic therapy. Refill hycodan as needed for cough and sleep. Encouraged to discontinue smoking. Continue over the counter medications as needed for symptom relief and supportive care. Follow up if symptoms worsen or fail to improve.      Relevant Medications   levofloxacin (LEVAQUIN) 500 MG tablet   fluconazole (DIFLUCAN) 150 MG tablet   HYDROcodone-homatropine (HYCODAN) 5-1.5 MG/5ML syrup

## 2015-09-02 NOTE — Patient Instructions (Signed)
Thank you for choosing [facility].  Summary/Instructions:  Your prescription(s) have been submitted to your pharmacy or been printed and provided for you. Please take as directed and contact our office if you believe you are having problem(s) with the medication(s) or have any questions.  If your symptoms worsen or fail to improve, please contact our office for further instruction, or in case of emergency go directly to the emergency room at the closest medical facility.   Sinusitis Sinusitis is redness, soreness, and inflammation of the paranasal sinuses. Paranasal sinuses are air pockets within the bones of your face (beneath the eyes, the middle of the forehead, or above the eyes). In healthy paranasal sinuses, mucus is able to drain out, and air is able to circulate through them by way of your nose. However, when your paranasal sinuses are inflamed, mucus and air can become trapped. This can allow bacteria and other germs to grow and cause infection. Sinusitis can develop quickly and last only a short time (acute) or continue over a long period (chronic). Sinusitis that lasts for more than 12 weeks is considered chronic.  CAUSES  Causes of sinusitis include:  Allergies.  Structural abnormalities, such as displacement of the cartilage that separates your nostrils (deviated septum), which can decrease the air flow through your nose and sinuses and affect sinus drainage.  Functional abnormalities, such as when the small hairs (cilia) that line your sinuses and help remove mucus do not work properly or are not present. SIGNS AND SYMPTOMS  Symptoms of acute and chronic sinusitis are the same. The primary symptoms are pain and pressure around the affected sinuses. Other symptoms include:  Upper toothache.  Earache.  Headache.  Bad breath.  Decreased sense of smell and taste.  A cough, which worsens when you are lying flat.  Fatigue.  Fever.  Thick drainage from your nose,  which often is green and may contain pus (purulent).  Swelling and warmth over the affected sinuses. DIAGNOSIS  Your health care provider will perform a physical exam. During the exam, your health care provider may:  Look in your nose for signs of abnormal growths in your nostrils (nasal polyps).  Tap over the affected sinus to check for signs of infection.  View the inside of your sinuses (endoscopy) using an imaging device that has a light attached (endoscope). If your health care provider suspects that you have chronic sinusitis, one or more of the following tests may be recommended:  Allergy tests.  Nasal culture. A sample of mucus is taken from your nose, sent to a lab, and screened for bacteria.  Nasal cytology. A sample of mucus is taken from your nose and examined by your health care provider to determine if your sinusitis is related to an allergy. TREATMENT  Most cases of acute sinusitis are related to a viral infection and will resolve on their own within 10 days. Sometimes medicines are prescribed to help relieve symptoms (pain medicine, decongestants, nasal steroid sprays, or saline sprays).  However, for sinusitis related to a bacterial infection, your health care provider will prescribe antibiotic medicines. These are medicines that will help kill the bacteria causing the infection.  Rarely, sinusitis is caused by a fungal infection. In theses cases, your health care provider will prescribe antifungal medicine. For some cases of chronic sinusitis, surgery is needed. Generally, these are cases in which sinusitis recurs more than 3 times per year, despite other treatments. HOME CARE INSTRUCTIONS   Drink plenty of water. Water helps   thin the mucus so your sinuses can drain more easily.  Use a humidifier.  Inhale steam 3 to 4 times a day (for example, sit in the bathroom with the shower running).  Apply a warm, moist washcloth to your face 3 to 4 times a day, or as directed  by your health care provider.  Use saline nasal sprays to help moisten and clean your sinuses.  Take medicines only as directed by your health care provider.  If you were prescribed either an antibiotic or antifungal medicine, finish it all even if you start to feel better. SEEK IMMEDIATE MEDICAL CARE IF:  You have increasing pain or severe headaches.  You have nausea, vomiting, or drowsiness.  You have swelling around your face.  You have vision problems.  You have a stiff neck.  You have difficulty breathing. MAKE SURE YOU:   Understand these instructions.  Will watch your condition.  Will get help right away if you are not doing well or get worse. Document Released: 11/16/2005 Document Revised: 04/02/2014 Document Reviewed: 12/01/2011 Exit[employer] Patient Information 2015 [employer]. This information is not intended to replace advice given to you by your health care provider. Make sure you discuss any questions you have with your health care provider.   

## 2015-09-13 ENCOUNTER — Ambulatory Visit (HOSPITAL_COMMUNITY): Payer: Managed Care, Other (non HMO)

## 2015-09-13 ENCOUNTER — Other Ambulatory Visit (HOSPITAL_COMMUNITY): Payer: Managed Care, Other (non HMO)

## 2015-09-18 ENCOUNTER — Encounter: Payer: Self-pay | Admitting: Obstetrics and Gynecology

## 2015-09-18 ENCOUNTER — Ambulatory Visit (INDEPENDENT_AMBULATORY_CARE_PROVIDER_SITE_OTHER): Payer: Managed Care, Other (non HMO) | Admitting: Obstetrics and Gynecology

## 2015-09-18 VITALS — BP 110/76 | HR 80 | Resp 14 | Ht 60.0 in | Wt 115.2 lb

## 2015-09-18 DIAGNOSIS — Z01419 Encounter for gynecological examination (general) (routine) without abnormal findings: Secondary | ICD-10-CM

## 2015-09-18 DIAGNOSIS — A630 Anogenital (venereal) warts: Secondary | ICD-10-CM | POA: Diagnosis not present

## 2015-09-18 DIAGNOSIS — F17200 Nicotine dependence, unspecified, uncomplicated: Secondary | ICD-10-CM

## 2015-09-18 DIAGNOSIS — N9089 Other specified noninflammatory disorders of vulva and perineum: Secondary | ICD-10-CM

## 2015-09-18 DIAGNOSIS — Z0000 Encounter for general adult medical examination without abnormal findings: Secondary | ICD-10-CM | POA: Diagnosis not present

## 2015-09-18 LAB — POCT URINALYSIS DIPSTICK
Bilirubin, UA: NEGATIVE
Blood, UA: NEGATIVE
GLUCOSE UA: NEGATIVE
Ketones, UA: NEGATIVE
Leukocytes, UA: NEGATIVE
Nitrite, UA: NEGATIVE
Protein, UA: NEGATIVE
Urobilinogen, UA: NEGATIVE
pH, UA: 5

## 2015-09-18 NOTE — Progress Notes (Addendum)
Patient ID: Danielle Obrien, female   DOB: 03/25/1981, 34 y.o.   MRN: [id number] 34 y.o. [id number] Single Caucasian female here for annual exam.    Due to pap smear and has an area in the pubic hair that she wants checked.  Feels it is getting bigger and darker.   Has an ECHO of heart on Monday.  Had cardiac cath - moderate aortic stenosis.  Has modified her anti HTN to control her blood pressure better, as it was too low.   Just treated with Levaquin for sinusitis. Took Diflucan for yeast vaginitis.   Taking calcium with vit D.  PCP:  [name], MD   No LMP recorded. Patient has had a hysterectomy.          Sexually active: Yes.   female partner  The current method of family planning is status post hysterectomy.    Exercising: Yes.    walks daily Smoker:  Yes, smokes 1/3 ppd cigarettes.  Will try Nicorette gum through health coach.   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 Pap 05/2012 ASCUS and Pos. HR HPV.  Colposcopy 05/2012 LGSIL-- no vaginal treatment.  02-05-14 pap LGSIL:Pos HR HPV.  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  MMG:  n/a Colonoscopy:  n/a BMD:   n/a  Result  n/a TDaP:  10-20-11 Screening Labs:  Hb today: PCP, Urine today: Neg   reports that she has been smoking Cigarettes.  She has a 6 pack-year smoking history. She has never used smokeless tobacco. She reports that she does not drink alcohol or use illicit drugs.  Past Medical History  Diagnosis Date  . Allergy   . Arthritis     In hips  . Asthma   . Hypertension   . Bicuspid aortic valve   . Anemia   . Alcohol abuse, in remission 2012  . Abnormal Pap smear of cervix     --recurrent ascus w/Pos. HR HPV  . Bipolar disorder ([place])   . ADD (attention deficit disorder)   . Headache     Past Surgical History  Procedure Laterality Date  . Hip surgery  1981    Hip Reset   .  Tympanostomy tube placement  1981/1982  . Hernia repair  1981/1982  . Tonsillectomy and adenoidectomy  1990  . Hip surgery  1990    Plate was reconstructed/ took out growth plate in Right knee  . Hip surgery  1992    Plate removed in Left hip  . Anterior cruciate ligament repair  1993  . Lymph node biopsy  1995  . Nasal septum surgery  2002  . Abdominal hysterectomy  02/06/2009    Robotic total laparoscopic hysterectomy  . 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 Colposcopy  10/2008    CIN I & II  . Colposcopy  07/2000    Neg. ECC  . Colposcopy  06/2001    CIN I  . Colposcopy  08/2004    ECC--atypia  . Cardiac catheterization  June 2015    no CAD - moderate AS noted  . Left and right heart catheterization with coronary angiogram N/A 05/18/2014    Procedure: LEFT AND RIGHT HEART CATHETERIZATION WITH CORONARY ANGIOGRAM;  Surgeon: Jettie Booze, MD;  Location: [facility] CATH LAB;  Service: Cardiovascular;  Laterality: N/A;  Current Outpatient Prescriptions  Medication Sig Dispense Refill  . CALCIUM PO Take 2 tablets by mouth daily.    . fluconazole (DIFLUCAN) 150 MG tablet Take 1 tablet (150 mg total) by mouth once. 1 tablet 2  . Fluticasone-Salmeterol (ADVAIR) 250-50 MCG/DOSE AEPB Inhale 1 puff into the lungs 2 (two) times daily as needed (shortness of breath).    Marland Kitchen HYDROcodone-acetaminophen (NORCO/VICODIN) 5-325 MG per tablet Take 1 tablet by mouth every 6 (six) hours as needed for moderate pain. 65 tablet 0  . HYDROcodone-homatropine (HYCODAN) 5-1.5 MG/5ML syrup Take 5 mLs by mouth every 8 (eight) hours as needed for cough. 120 mL 0  . lamoTRIgine (LAMICTAL) 200 MG tablet TAKE 1 TABLET (200 MG TOTAL) BY MOUTH 2 (TWO) TIMES DAILY. 180 tablet 1  . levofloxacin (LEVAQUIN) 500 MG tablet Take 1 tablet (500 mg total) by mouth daily. 7 tablet 0  . losartan (COZAAR) 50 MG tablet Take 1 tablet by mouth daily.    . SUMAtriptan (IMITREX) 100 MG  tablet Take 1tab at earliest onset of headache.  May repeat x1 in 2 hours if headache persists or recurs. 9 tablet 2  . traMADol (ULTRAM) 50 MG tablet Take 1 tablet (50 mg total) by mouth every 8 (eight) hours as needed. 90 tablet 0  . traZODone (DESYREL) 50 MG tablet Take 50 mg by mouth 3 (three) times daily as needed for sleep (and anxiety).      No current facility-administered medications for this visit.    Family History  Problem Relation Age of Onset  . Cancer Neg Hx   . Hyperlipidemia Mother   . Hypertension Mother   . Thyroid disease Mother   . Hyperlipidemia Father   . Hypertension Father   . Migraines Father   . Hypertension Brother   . Hyperlipidemia Brother   . Thyroid disease Maternal Grandmother   . Thyroid disease Maternal Grandfather     ROS:  Pertinent items are noted in HPI.  Otherwise, a comprehensive ROS was negative.  Exam:   BP 110/76 mmHg  Pulse 80  Resp 14  Ht 5' (1.524 m)  Wt 115 lb 3.2 oz (52.254 kg)  BMI 22.50 kg/m2    General appearance: alert, cooperative and appears stated age Head: Normocephalic, without obvious abnormality, atraumatic Neck: no adenopathy, supple, symmetrical, trachea midline and thyroid normal to inspection and palpation Lungs: clear to auscultation bilaterally Breasts: normal appearance, no masses or tenderness, Inspection negative, No nipple retraction or dimpling, No nipple discharge or bleeding, No axillary or supraclavicular adenopathy Heart: regular rate and rhythm.  Systolic murmur prominent.  Abdomen: soft, non-tender; bowel sounds normal; no masses,  no organomegaly Extremities: extremities normal, atraumatic, no cyanosis or edema Skin: Skin color, texture, turgor normal. No rashes or lesions Lymph nodes: Cervical, supraclavicular, and axillary nodes normal. No abnormal inguinal nodes palpated Neurologic: Grossly normal  Pelvic: External genitalia: right mons pubis with 1.5 cm broad based freckled slightly raised  lesion.  Multiple darkly pigmented condyloma of vulva.              Urethra:  normal appearing urethra with no masses, tenderness or lesions              Bartholins and Skenes: normal                 Vagina: normal appearing vagina with normal color and discharge, no lesions              Cervix: absent .  Scar  tissue at cuff. Slightly irregular to touch to right of midline.               Pap taken: Yes.   Bimanual Exam:  Uterus:  uterus absent              Adnexa: normal adnexa and no mass, fullness, tenderness              Rectovaginal: No..  Confirms.              Anus:  normal sphincter tone, no lesions  Chaperone was present for exam.  Assessment:   Well woman visit with normal exam. Status post robotic hysterectomy in 2010.  Hx condyloma and LGSIL. Pigmented lesion of the vulva.  Smoker.    Plan: Yearly mammogram recommended after age 36.  Recommended self breast exam.  Pap and HR HPV as above. Discussed Calcium, Vitamin D, regular exercise program including cardiovascular and weight bearing exercise. Labs performed.  No..     Refills given on medications.  No..    Return for vulvar biopsies.  Discussed possible treatment options for condyloma - laser, cryo, [place], TCA, etc.  Until stops smoking, this may be a recurrent issue for her.  Follow up annually and prn.   Additional counseling given regarding smoking cessation.  Referred to smoking cessation program.   After visit summary provided.

## 2015-09-18 NOTE — Patient Instructions (Signed)

## 2015-09-19 ENCOUNTER — Other Ambulatory Visit: Payer: Self-pay | Admitting: Cardiology

## 2015-09-20 LAB — IPS PAP TEST WITH HPV

## 2015-09-21 ENCOUNTER — Other Ambulatory Visit: Payer: Self-pay | Admitting: Internal Medicine

## 2015-09-23 ENCOUNTER — Telehealth: Payer: Self-pay | Admitting: Obstetrics and Gynecology

## 2015-09-23 NOTE — Telephone Encounter (Signed)
Spoke with patient to give information on smoking cessation referral. Patient at work and requested I call back and leave details on voicemail. Agreeable and ended call.  Called back and left phone number [phone]) and website ([email or web address] with step by step instructions to self register as required by the smoking cessation program. Left return call information for any questions.  Ok to close.

## 2015-09-24 ENCOUNTER — Other Ambulatory Visit: Payer: Self-pay

## 2015-09-24 ENCOUNTER — Ambulatory Visit (HOSPITAL_COMMUNITY): Payer: Managed Care, Other (non HMO) | Attending: Internal Medicine

## 2015-09-24 ENCOUNTER — Telehealth: Payer: Self-pay | Admitting: Emergency Medicine

## 2015-09-24 DIAGNOSIS — I517 Cardiomegaly: Secondary | ICD-10-CM | POA: Diagnosis not present

## 2015-09-24 DIAGNOSIS — I10 Essential (primary) hypertension: Secondary | ICD-10-CM | POA: Diagnosis not present

## 2015-09-24 DIAGNOSIS — I340 Nonrheumatic mitral (valve) insufficiency: Secondary | ICD-10-CM | POA: Insufficient documentation

## 2015-09-24 DIAGNOSIS — I350 Nonrheumatic aortic (valve) stenosis: Secondary | ICD-10-CM | POA: Insufficient documentation

## 2015-09-24 DIAGNOSIS — R87622 Low grade squamous intraepithelial lesion on cytologic smear of vagina (LGSIL): Secondary | ICD-10-CM

## 2015-09-24 DIAGNOSIS — N9089 Other specified noninflammatory disorders of vulva and perineum: Secondary | ICD-10-CM

## 2015-09-24 DIAGNOSIS — I358 Other nonrheumatic aortic valve disorders: Secondary | ICD-10-CM | POA: Insufficient documentation

## 2015-09-24 NOTE — Telephone Encounter (Signed)
Ok for Ativan 1 mg day of procedure after her consent is signed the day of the procedure.

## 2015-09-24 NOTE — Telephone Encounter (Signed)
-----   Message from [name], MD sent at 09/24/2015  8:45 AM EDT ----- Please contact patient with results.  Pap of vaginal vault showing LGSIL and positive HR HPV.   She needs: 1. colposcopy of the vaginal cuff. 2. colposcopy of the vulva  3. vulvar biopsy.    She has know condyloma of the vulva and a vulvar pigmented lesion.  She has a history of prior cervical dysplasia and hysterectomy.   Please inform patient and then send all three procedures for precert.  Thank you!  Cc- Marisa Sprinkles

## 2015-09-24 NOTE — Telephone Encounter (Signed)
Patient notified of message from Dr. Quincy Simmonds.  She is agreeable to scheduling colposcopy of vagina and vulva and vulvar biopsy. Scheduled for Friday 10/11/15 at 0900 as patient is off that day.  Brief description of procedure given to patient.  Colposcopy pre-procedure instructions given. Patient with history of hystorectomy Make sure to eat a meal and hydrate before appointment.  Advised 800 mg of Motrin PO with food one hour prior to appointment.    Patient verbalized understanding of preprocedure instructions.  Patient is advised she will be contacted with insurance coverage information. cc Kerry Hough Orders placed.   Dr. Quincy Simmonds, patient is requesting pre-medication for procedure. She is advised will need driver. She states she is off of work on this date and can bring a Geophysicist/field seismologist. Prior procedure in office patient was provided with Ativan from office supply after signing consent.

## 2015-09-24 NOTE — Telephone Encounter (Signed)
Message left to return call to Danielle Obrien at [phone].    

## 2015-09-27 ENCOUNTER — Encounter: Payer: Self-pay | Admitting: Cardiology

## 2015-09-27 ENCOUNTER — Ambulatory Visit (INDEPENDENT_AMBULATORY_CARE_PROVIDER_SITE_OTHER): Payer: Managed Care, Other (non HMO) | Admitting: Cardiology

## 2015-09-27 ENCOUNTER — Encounter: Payer: Self-pay | Admitting: *Deleted

## 2015-09-27 VITALS — BP 120/80 | HR 79 | Ht 60.0 in | Wt 113.0 lb

## 2015-09-27 DIAGNOSIS — Q231 Congenital insufficiency of aortic valve: Secondary | ICD-10-CM

## 2015-09-27 DIAGNOSIS — I350 Nonrheumatic aortic (valve) stenosis: Secondary | ICD-10-CM

## 2015-09-27 DIAGNOSIS — Z720 Tobacco use: Secondary | ICD-10-CM

## 2015-09-27 DIAGNOSIS — I10 Essential (primary) hypertension: Secondary | ICD-10-CM

## 2015-09-27 HISTORY — DX: Tobacco use: Z72.0

## 2015-09-27 NOTE — Progress Notes (Addendum)
Cardiology Office Note   Date:  09/27/2015   ID:  Danielle Obrien, DOB 1981-03-26, MRN [id number]  PCP:  [name], MD    Chief Complaint  Patient presents with  . Aortic Stenosis      History of Present Illness: [name] is a 34 yo WF who has a history of HTN, bicuspid AV with a heart murmur and asthma. She is bipolar as well. Had some chest pain. Subsequently referred for cath after abnormal GXT with hypotensive BP response - normal findings on cath with moderate AS noted. AVA of 1.3. She is doing well today. She denies any chest pain (except when very stressed or fatigued), SOB, DOE, LE edema,dizziness, palpitations or syncope.    Past Medical History  Diagnosis Date  . Allergy   . Arthritis     In hips  . Asthma   . Hypertension   . Bicuspid aortic valve     moderate AS by echo 08/2015 with mean AVG 66mmHg and AVA 1.1cm2  . Anemia   . Alcohol abuse, in remission 2012  . Abnormal Pap smear of cervix     --recurrent ascus w/Pos. HR HPV  . Bipolar disorder ([place])   . ADD (attention deficit disorder)   . Headache   . Tobacco abuse 09/27/2015    Past Surgical History  Procedure Laterality Date  . Hip surgery  1981    Hip Reset   . Tympanostomy tube placement  1981/1982  . Hernia repair  1981/1982  . Tonsillectomy and adenoidectomy  1990  . Hip surgery  1990    Plate was reconstructed/ took out growth plate in Right knee  . Hip surgery  1992    Plate removed in Left hip  . Anterior cruciate ligament repair  1993  . Lymph node biopsy  1995  . Nasal septum surgery  2002  . Abdominal hysterectomy  02/06/2009    Robotic total laparoscopic hysterectomy  . 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 Colposcopy  10/2008    CIN I & II  . Colposcopy  07/2000    Neg. ECC  . Colposcopy  06/2001    CIN I  . Colposcopy  08/2004    ECC--atypia  . Cardiac catheterization  June 2015    no CAD -  moderate AS noted  . Left and right heart catheterization with coronary angiogram N/A 05/18/2014    Procedure: LEFT AND RIGHT HEART CATHETERIZATION WITH CORONARY ANGIOGRAM;  Surgeon: Jettie Booze, MD;  Location: [facility] CATH LAB;  Service: Cardiovascular;  Laterality: N/A;     Current Outpatient Prescriptions  Medication Sig Dispense Refill  . CALCIUM PO Take 2 tablets by mouth daily.    . fluconazole (DIFLUCAN) 150 MG tablet Take 1 tablet (150 mg total) by mouth once. 1 tablet 2  . Fluticasone-Salmeterol (ADVAIR) 250-50 MCG/DOSE AEPB Inhale 1 puff into the lungs 2 (two) times daily as needed (shortness of breath).    Marland Kitchen HYDROcodone-acetaminophen (NORCO/VICODIN) 5-325 MG per tablet Take 1 tablet by mouth every 6 (six) hours as needed for moderate pain. 65 tablet 0  . HYDROcodone-homatropine (HYCODAN) 5-1.5 MG/5ML syrup Take 5 mLs by mouth every 8 (eight) hours as needed for cough. 120 mL 0  . lamoTRIgine (LAMICTAL) 200 MG tablet TAKE 1 TABLET BY MOUTH TWICE DAILY 180 tablet 0  .  levofloxacin (LEVAQUIN) 500 MG tablet Take 1 tablet (500 mg total) by mouth daily. 7 tablet 0  . losartan (COZAAR) 50 MG tablet Take 1 tablet by mouth daily.    Marland Kitchen losartan (COZAAR) 50 MG tablet TAKE 1 TABLET BY MOUTH DAILY. 30 tablet 0  . SUMAtriptan (IMITREX) 100 MG tablet Take 100 mg by mouth daily as needed for migraine. May repeat in 2 hours if headache persists or recurs.    . traMADol (ULTRAM) 50 MG tablet Take 50 mg by mouth every 6 (six) hours as needed for moderate pain or severe pain.    . traZODone (DESYREL) 50 MG tablet Take 50 mg by mouth 3 (three) times daily as needed for sleep (and anxiety).      No current facility-administered medications for this visit.    Allergies:   Demerol; Morphine and related; and Codeine    Social History:  The patient  reports that she has been smoking Cigarettes.  She has a 6 pack-year smoking history. She has never used smokeless tobacco. She reports that she does not  drink alcohol or use illicit drugs.   Family History:  The patient's family history includes Hyperlipidemia in her brother, father, and mother; Hypertension in her brother, father, and mother; Migraines in her father; Thyroid disease in her maternal grandfather, maternal grandmother, and mother. There is no history of Cancer.    ROS:  Please see the history of present illness.   Otherwise, review of systems are positive for none.   All other systems are reviewed and negative.    PHYSICAL EXAM: VS:  BP 120/80 mmHg  Pulse 79  Ht 5' (1.524 m)  Wt 113 lb (51.256 kg)  BMI 22.07 kg/m2 , BMI Body mass index is 22.07 kg/(m^2). GEN: Well nourished, well developed, in no acute distress HEENT: normal Neck: no JVD, carotid bruits, or masses Cardiac: RRR; no murmurs, rubs, or gallops,no edema  Respiratory:  clear to auscultation bilaterally, normal work of breathing GI: soft, nontender, nondistended, + BS MS: no deformity or atrophy Skin: warm and dry, no rash Neuro:  Strength and sensation are intact Psych: euthymic mood, full affect   EKG:  EKG was ordered today and showed NSR with LAE    Recent Labs: 01/07/2015: ALT 18; BUN 17; Creatinine, Ser 0.92; Hemoglobin 15.3*; Platelets 179.0; Potassium 4.2; Sodium 139; TSH 0.80    Lipid Panel    Component Value Date/Time   CHOL 282* 01/07/2015 1025   TRIG 44.0 01/07/2015 1025   HDL 89.20 01/07/2015 1025   CHOLHDL 3 01/07/2015 1025   VLDL 8.8 01/07/2015 1025   LDLCALC 184* 01/07/2015 1025   LDLDIRECT 171.0 01/27/2013 1425      Wt Readings from Last 3 Encounters:  09/27/15 113 lb (51.256 kg)  09/18/15 115 lb 3.2 oz (52.254 kg)  09/02/15 114 lb (51.71 kg)    Assessment / Plan:  1. Aortic stenosis - moderate cath.  Repeat echo showed normal LVF with mild LVH, bicuspid AV with moderate AS.   Mean gradient has increased from 31 to 9mmHg with AVA 1.1cm2.  Repeat echo in 1 year 2. Smoking - she has cut down to 1/4 of a pack daily -  encouraged her to continue with goal of total cessation  3. HTN - controlled - continue Losartan     Current medicines are reviewed at length with the patient today.  The patient does not have concerns regarding medicines.  The following changes have been made:  no change  Labs/ tests ordered today: See above Assessment and Plan  Orders Placed This Encounter  Procedures  . EKG 12-Lead     Disposition:   FU with me in 6 months  Signed, Sueanne Margarita, MD  09/27/2015 12:28 PM    [facility] [address], [place], [place]  [address] Phone: [phone]; Fax: [phone]

## 2015-09-27 NOTE — Patient Instructions (Signed)
Medication Instructions:  Your physician recommends that you continue on your current medications as directed. Please refer to the Current Medication list given to you today.  Labwork: NONE  Testing/Procedures: NONE  Follow-Up: Your physician wants you to follow-up in: 6 months with Dr. Turner. You will receive a reminder letter in the mail two months in advance. If you don't receive a letter, please call our office to schedule the follow-up appointment.   If you need a refill on your cardiac medications before your next appointment, please call your pharmacy.    

## 2015-09-30 NOTE — Telephone Encounter (Signed)
Message left to return call to Desarea Ohagan at [phone].    

## 2015-09-30 NOTE — Telephone Encounter (Signed)
Patient returning call.

## 2015-10-02 ENCOUNTER — Other Ambulatory Visit: Payer: Self-pay | Admitting: *Deleted

## 2015-10-02 MED ORDER — TRAMADOL HCL 50 MG PO TABS: 50.0000 mg | ORAL_TABLET | Freq: Three times a day (TID) | ORAL | Status: DC | PRN

## 2015-10-02 NOTE — Telephone Encounter (Signed)
Refill done.  

## 2015-10-02 NOTE — Telephone Encounter (Signed)
Spoke with patient. Reviewed benefits for scheduled colposcopy and biopsy. Patient agreeable. Reviewed 72 hour cancellation policy with $670 fee. Patient agreeable.   Patient requested return call from Danielle Obrien.

## 2015-10-02 NOTE — Telephone Encounter (Signed)
Patient notified of message from Dr. Quincy Simmonds.  She will come early for check in at 0830 sign paperwork and take Ativan. Understands she needs a driver with her and agreeable.  Routing to provider for final review. Patient agreeable to disposition. Will close encounter.   cc Marisa Sprinkles.

## 2015-10-11 ENCOUNTER — Encounter: Payer: Self-pay | Admitting: Obstetrics and Gynecology

## 2015-10-11 ENCOUNTER — Ambulatory Visit (INDEPENDENT_AMBULATORY_CARE_PROVIDER_SITE_OTHER): Payer: Managed Care, Other (non HMO) | Admitting: Obstetrics and Gynecology

## 2015-10-11 DIAGNOSIS — R87622 Low grade squamous intraepithelial lesion on cytologic smear of vagina (LGSIL): Secondary | ICD-10-CM

## 2015-10-11 DIAGNOSIS — N9089 Other specified noninflammatory disorders of vulva and perineum: Secondary | ICD-10-CM | POA: Diagnosis not present

## 2015-10-11 NOTE — Progress Notes (Signed)
Subjective:     Patient ID: Danielle Obrien, female   DOB: 07-Dec-1980, 34 y.o.   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 Pap 05/2012 ASCUS and Pos. HR HPV. Colposcopy 05/2012 LGSIL-- no vaginal treatment. 02-05-14 pap LGSIL:Pos HR HPV. 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  Received Ativan 1 mg lot number [id number], exp July 2107.   Review of Systems [id number]    Objective:   Physical Exam  Genitourinary:        Colposcopy of the vagina with biopsy and biopsy of the vulva. Consent for procedures.  Received Ativan 1 mg.  Speculum placed in vagina.  3% acetic acid used.  Condyloma of the vagina appreciated.  Biopsy of the anterior vaginal cuff and the central vaginal cuff performed and sent to pathology separately.  Minimal bleeding.  No complications.   Exam of the vulva and multiple condyloma noted.  Right mons pubis with 1 cm flaking slightly pigmented area noted.  Right mons and right labia sterilized with betadine.  Local 1% lidocaine lot number [id number], expiration 12/01/15.  3 mm punch or right mons pubis, and tissue to pathology. Right labia condyloma excised with scalpel, and tissue to pathology.  3/0 vicryl simple sutures to area and bandaids placed.  Minimal EBL and no complications.      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  Condyloma of the vagina and vulva.  Smoker.    Plan:     Instructions and precautions given post biopsies.  Follow up results of biopsies.  I discussed possible laser treatment to all areas.  Again discussed smoking cessation.   After visit summary to patient.

## 2015-10-11 NOTE — Patient Instructions (Signed)
Colposcopy  Care After  Colposcopy is a procedure in which a special tool is used to magnify the surface of the cervix. A tissue sample (biopsy) may also be taken. This sample will be looked at for cervical cancer or other problems. After the test:  · You may have some cramping.  · Lie down for a few minutes if you feel lightheaded.  ·  You may have some bleeding which should stop in a few days.  HOME CARE  · Do not have sex or use tampons for 2 to 3 days or as told.  · Only take medicine as told by your doctor.  · Continue to take your birth control pills as usual.  Finding out the results of your test  Ask when your test results will be ready. Make sure you get your test results.  GET HELP RIGHT AWAY IF:  · You are bleeding a lot or are passing blood clots.  · You develop a fever of 102° F (38.9° C) or higher.  · You have abnormal vaginal discharge.  · You have cramps that do not go away with medicine.  · You feel lightheaded, dizzy, or pass out (faint).  MAKE SURE YOU:   · Understand these instructions.  · Will watch your condition.  · Will get help right away if you are not doing well or get worse.     This information is not intended to replace advice given to you by your health care provider. Make sure you discuss any questions you have with your health care provider.     Document Released: 05/04/2008 Document Revised: 02/08/2012 Document Reviewed: 06/15/2013  Elsevier Interactive Patient Education ©2016 [employer]

## 2015-10-14 ENCOUNTER — Other Ambulatory Visit: Payer: Self-pay | Admitting: Cardiology

## 2015-10-15 LAB — IPS OTHER TISSUE BIOPSY

## 2015-10-17 ENCOUNTER — Ambulatory Visit (INDEPENDENT_AMBULATORY_CARE_PROVIDER_SITE_OTHER): Payer: Managed Care, Other (non HMO) | Admitting: Family Medicine

## 2015-10-17 ENCOUNTER — Ambulatory Visit (INDEPENDENT_AMBULATORY_CARE_PROVIDER_SITE_OTHER)
Admission: RE | Admit: 2015-10-17 | Discharge: 2015-10-17 | Disposition: A | Discharge status: Home / Self Care | Payer: Managed Care, Other (non HMO) | Source: Ambulatory Visit | Attending: Family Medicine | Admitting: Family Medicine

## 2015-10-17 ENCOUNTER — Encounter: Payer: Self-pay | Admitting: Family Medicine

## 2015-10-17 VITALS — BP 108/64 | HR 72 | Ht 60.0 in | Wt 114.0 lb

## 2015-10-17 DIAGNOSIS — M2170 Unequal limb length (acquired), unspecified site: Secondary | ICD-10-CM

## 2015-10-17 DIAGNOSIS — M533 Sacrococcygeal disorders, not elsewhere classified: Secondary | ICD-10-CM | POA: Diagnosis not present

## 2015-10-17 MED ORDER — TRAMADOL HCL 50 MG PO TABS: 50.0000 mg | ORAL_TABLET | Freq: Three times a day (TID) | ORAL | Status: DC | PRN

## 2015-10-17 MED ORDER — GABAPENTIN 100 MG PO CAPS: 200.0000 mg | ORAL_CAPSULE | Freq: Every day | ORAL | Status: DC

## 2015-10-17 NOTE — Assessment & Plan Note (Signed)
Made some changes to orthotic today  Discussed proper shoewear. Continue to monitor closely.

## 2015-10-17 NOTE — Patient Instructions (Addendum)
Good to see you Continue to stay active and wear the good shoes.  Made change to the orthotic Ice is your friend Continue the tramadol up to 3 times a day but would love for 2 times Start gabapentin 100mg  at night and can go up to 200mg   See me again in 3-4 months.  Xrays downstairs Happy holidays!

## 2015-10-17 NOTE — Assessment & Plan Note (Addendum)
Patient likely also has some underlying arthritic changes. We will get x-rays to make sure that there is no other bony abdomen I could be contribute in. We discussed icing regimen and home exercises. Patient had some changes to her orthotics made today. Patient will continue on the tramadol we discussed that we'll not increased. Patient will take this with Tylenol and patient given a new prescription with some mild gabapentin. Hopefully that this will be beneficial. Patient come back and see me again in 3-4 months

## 2015-10-17 NOTE — Progress Notes (Signed)
Pre visit review using our clinic review tool, if applicable. No additional management support is needed unless otherwise documented below in the visit note. 

## 2015-10-17 NOTE — Progress Notes (Signed)
  Danielle Obrien, [place] [address] Phone: [phone] Subjective:     CC: Left hip pain follow-up  [id number] Danielle Obrien is a 34 y.o. female coming in with complaint of left hip pain. Patient does give a past medical history significant for hip surgery on multiple occasions. 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 Patient needed plating which has been removed as well with the last surgery in 1992. Patient has had a leg length discrepancy since that time .    Patient was seen previously and was given custom orthotics and has been trying to change her leg length discrepancy over the course of time. Patient was to be doing home exercises, icing protocol, and does take tramadol for breakthrough pain.Been 3 months since I saw patient. States that patient has tried to decrease her tramadol to 2 times daily. States that the orthotics seem to be holding up at each and she needs new shoes. Denies any significant worsening of the pain and states that overall she feels that she is stable.     Past medical history, social, surgical and family history all reviewed in electronic medical record.   Review of Systems: No headache, visual changes, nausea, vomiting, diarrhea, constipation, dizziness, abdominal pain, skin rash, fevers, chills, night sweats, weight loss, swollen lymph nodes, body aches, joint swelling, muscle aches, chest pain, shortness of breath, mood changes.   Objective Blood pressure 108/64, pulse 72, height 5' (1.524 m), weight 114 lb (51.71 kg), SpO2 98 %.  General: Patient is slurring her words and does have pinpoint pupils HEENT: Pinpoint pupils Respiratory: Patient's speak in full sentences and does not appear short of breath  Cardiovascular: No lower extremity edema, non tender, no erythema  Skin: Warm dry intact with no signs of infection or rash on extremities or on axial skeleton.  Abdomen:  Soft nontender  Neuro: Cranial nerves II through XII are intact, neurovascularly intact in all extremities with 2+ DTRs and 2+ pulses.  Lymph: No lymphadenopathy of posterior or anterior cervical chain or axillae bilaterally.  Gait normal with good balance and coordination.  MSK:  Non tender with full range of motion and good stability and symmetric strength and tone of shoulders, elbows, wrist, , knee and ankles bilaterally.  Back exam shows that patient does have some mild pictures scoliosis of the lumbar spine. Patient does have a leg length discrepancy of a inch and a half on the left side being shorter. Patient's left-sided SI joint is tender to palpation  in does have a positive Faber test. Neurovascularly intact distally with a negative straight leg test. Full strength.  Foot exam shows the patient does have overpronation of the right foot as well as oversupination of the hindfoot. Patient Mild bunion and bunionette formation of the feet bilaterally.  no change from previous exam  Looking at patient's orthotic she has caused some mild oversupination and wear down of the posting on the right lateral heel. This was changed out today.    Impression and Recommendations:     This case required medical decision making of moderate complexity.    HVl

## 2015-10-21 ENCOUNTER — Telehealth: Payer: Self-pay | Admitting: Emergency Medicine

## 2015-10-21 NOTE — Telephone Encounter (Signed)
Spoke with patient and message from Dr. Quincy Simmonds discussed. Offered consult to discuss results and plan for care of patient and patient accepts. Scheduled for 10/31/15 at 1300 with Dr. Quincy Simmonds (patient choice for date).  06 recall placed for January.  Patient will call back with any questions or concerns prior to appointment.

## 2015-10-21 NOTE — Telephone Encounter (Signed)
-----   Message from [name], MD sent at 10/18/2015  6:11 AM EST ----- Please contact patient with results of colposcopy.  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 There is no sign of cancer!!! At a minimum, treatment is indicated for the vaginal dysplasia. Choices are as follows:  5FU in the vagina, outpatient surgery of laser to the vaginal lesions and the vulvar lesions, or referral to [place] [facility] for evaluation and treatment.  Would need cardiac clearance for surgery due to her aortic stenosis.  Please place in "pap" recall for January 2017 so she stays closely in contact with [place].   May need follow up appointment with me in near future to discuss further.   Cc - Marisa Sprinkles.

## 2015-10-29 ENCOUNTER — Telehealth: Payer: Self-pay | Admitting: Obstetrics and Gynecology

## 2015-10-29 NOTE — Telephone Encounter (Signed)
Patient says she was here recently and Dr. Quincy Simmonds put in some stitches. Patient says they were supposed to dissolve on their own but patient says she has a stitch that is almost out but it's not out all the way. She says it is painful and it keeps catching on her underwear.  Best # to reach [phone]

## 2015-10-29 NOTE — Telephone Encounter (Signed)
Spoke with patient. Patient states that she has one stitch left that keeps pulling on her underwear and is painful. Patient has been placing a bandaid over the stitch so that it will not get caught and pull. Advised patient that Dr.Silva is out of the office today, but I could get her in to see another MD to have the stitch removed. Patient declines. Patient states that her next day off is not until Thursday 12/1 and she has an appointment with Dr.Silva at 1 pm that day. Advised patient can have the stitch removed at that appointment. Advised if discomfort increases and she would like to be seen sooner may call our office for earlier appointment. Patient is agreeable.  Routing to provider for final review. Patient agreeable to disposition. Will close encounter.

## 2015-10-30 ENCOUNTER — Telehealth: Payer: Self-pay | Admitting: Obstetrics and Gynecology

## 2015-10-30 NOTE — Telephone Encounter (Signed)
Patient has an appointment tomorrow for consult /results /treatment and stitch removal. Patient would like to reschedule the consult portion and only have the stitch removal. Patient's grandfather passed away and her mother will need to travel to [place] and cannot be here for the consult. Patient would like to reschedule this consult appointment at her stitch removal appointment tomorrow.

## 2015-10-30 NOTE — Telephone Encounter (Signed)
Thank you for the update.  Please let me know if the patient cancels her appointment for suture removal.

## 2015-10-31 ENCOUNTER — Ambulatory Visit (INDEPENDENT_AMBULATORY_CARE_PROVIDER_SITE_OTHER): Payer: Managed Care, Other (non HMO) | Admitting: Obstetrics and Gynecology

## 2015-10-31 ENCOUNTER — Encounter: Payer: Self-pay | Admitting: Obstetrics and Gynecology

## 2015-10-31 VITALS — BP 90/60 | HR 84 | Resp 16 | Ht 60.0 in | Wt 115.0 lb

## 2015-10-31 DIAGNOSIS — A630 Anogenital (venereal) warts: Secondary | ICD-10-CM

## 2015-10-31 DIAGNOSIS — N891 Moderate vaginal dysplasia: Secondary | ICD-10-CM

## 2015-10-31 NOTE — Progress Notes (Signed)
GYNECOLOGY  VISIT   HPI: 34 y.o.   Single  Caucasian  female   G1P0010 with No LMP recorded. Patient has had a hysterectomy.   here for Follow up    Wants suture removal.  Suture on right labia is painful.  Considering surgical care for beginning of February 2017.   Mother's father passed away.  Patient's parents to [place] today to make arrangements for family.   GYNECOLOGIC HISTORY: No LMP recorded. Patient has had a hysterectomy. Contraception: Hysterectomy  Menopausal hormone therapy: None Last mammogram: ? Last pap smear:  09/18/15 LSIL. HR HPV:+Detected. Colpo Bx VAIN II        OB History    Gravida Para Term Preterm AB TAB SAB Ectopic Multiple Living   1    1     0         Patient Active Problem List   Diagnosis Date Noted  . Tobacco abuse 09/27/2015  . Sinusitis, acute 08/20/2015  . Nonallopathic lesion of sacral region 04/10/2015  . SI (sacroiliac) joint dysfunction 02/20/2015  . Leg length discrepancy 02/20/2015  . Hip pain 01/07/2015  . Allergic rhinitis, cause unspecified 05/04/2014  . Aortic stenosis, moderate 02/20/2014  . Bipolar disorder ([place]) 02/05/2014  . Bicuspid aortic valve 01/27/2013  . Migraine with status migrainosus 01/27/2013  . Dyslipidemia (high LDL; low HDL) 01/27/2013  . Routine general medical examination at a health care facility 10/20/2011  . Mild persistent asthma 04/21/2011  . Essential hypertension, benign 04/21/2011    Past Medical History  Diagnosis Date  . Allergy   . Arthritis     In hips  . Asthma   . Hypertension   . Bicuspid aortic valve     moderate AS by echo 08/2015 with mean AVG 64mmHg and AVA 1.1cm2  . Anemia   . Alcohol abuse, in remission 2012  . Abnormal Pap smear of cervix     --recurrent ascus w/Pos. HR HPV  . Bipolar disorder ([place])   . ADD (attention deficit disorder)   . Headache   . Tobacco abuse 09/27/2015    Past Surgical History  Procedure Laterality Date  . Hip surgery  1981    Hip Reset    . Tympanostomy tube placement  1981/1982  . Hernia repair  1981/1982  . Tonsillectomy and adenoidectomy  1990  . Hip surgery  1990    Plate was reconstructed/ took out growth plate in Right knee  . Hip surgery  1992    Plate removed in Left hip  . Anterior cruciate ligament repair  1993  . Lymph node biopsy  1995  . Nasal septum surgery  2002  . Abdominal hysterectomy  02/06/2009    Robotic total laparoscopic hysterectomy  . 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 Colposcopy  10/2008    CIN I & II  . Colposcopy  07/2000    Neg. ECC  . Colposcopy  06/2001    CIN I  . Colposcopy  08/2004    ECC--atypia  . Cardiac catheterization  June 2015    no CAD - moderate AS noted  . Left and right heart catheterization with coronary angiogram N/A 05/18/2014    Procedure: LEFT AND RIGHT HEART CATHETERIZATION WITH CORONARY ANGIOGRAM;  Surgeon: Jettie Booze, MD;  Location: [facility] CATH LAB;  Service: Cardiovascular;  Laterality: N/A;    Current Outpatient Prescriptions  Medication Sig Dispense Refill  . CALCIUM  PO Take 2 tablets by mouth daily.    . fluconazole (DIFLUCAN) 150 MG tablet Take 1 tablet (150 mg total) by mouth once. 1 tablet 2  . Fluticasone-Salmeterol (ADVAIR) 250-50 MCG/DOSE AEPB Inhale 1 puff into the lungs 2 (two) times daily as needed (shortness of breath).    . gabapentin (NEURONTIN) 100 MG capsule Take 2 capsules (200 mg total) by mouth at bedtime. 60 capsule 3  . lamoTRIgine (LAMICTAL) 200 MG tablet TAKE 1 TABLET BY MOUTH TWICE DAILY 180 tablet 0  . losartan (COZAAR) 50 MG tablet TAKE 1 TABLET BY MOUTH DAILY.---NEED APPOINTMENT FOR ADDITIONAL REFILLS 30 tablet 5  . SUMAtriptan (IMITREX) 100 MG tablet Take 100 mg by mouth daily as needed for migraine. May repeat in 2 hours if headache persists or recurs.    . traMADol (ULTRAM) 50 MG tablet Take 1 tablet (50 mg total) by mouth every 8 (eight) hours as needed for moderate pain or  severe pain. 90 tablet 2   No current facility-administered medications for this visit.     ALLERGIES: Demerol; Morphine and related; and Codeine  Family History  Problem Relation Age of Onset  . Cancer Neg Hx   . Hyperlipidemia Mother   . Hypertension Mother   . Thyroid disease Mother   . Hyperlipidemia Father   . Hypertension Father   . Migraines Father   . Hypertension Brother   . Hyperlipidemia Brother   . Thyroid disease Maternal Grandmother   . Thyroid disease Maternal Grandfather     Social History   Social History  . Marital Status: Single    Spouse Name: N/A  . Number of Children: N/A  . Years of Education: N/A   Occupational History  . Not on file.   Social History Main Topics  . Smoking status: Current Every Day Smoker -- 0.25 packs/day for 12 years    Types: Cigarettes  . Smokeless tobacco: Never Used  . Alcohol Use: No  . Drug Use: No  . Sexual Activity:    Partners: Male    Birth Control/ Protection: Surgical     Comment: Hysterectomy   Other Topics Concern  . Not on file   Social History Narrative    ROS:  Pertinent items are noted in HPI.  PHYSICAL EXAMINATION:    BP 90/60 mmHg  Pulse 84  Resp 16  Ht 5' (1.524 m)  Wt 115 lb (52.164 kg)  BMI 22.46 kg/m2    General appearance: alert, cooperative and appears stated age.  Tearful when speaking about her grandfather.    Pelvic: External genitalia:  Suture present on the mons pubis and the right vulva. Each removed.  No erythema or induration.  Well healed.    Chaperone was present for exam.  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 Status post hysterectomy.  Smoker.  Aortic stenosis.   PLAN  Counseled regarding VAIN II and condyloma.  Options of laser treatment of condyloma and VAIN II versus 5FU of VAIN II and laser to vulvar lesions briefly discussed.  May need excision of large vulvar condyloma.  Patient will return for formal consultation with  her mother to discuss options further.  She will need cardiac clearance which we can review at her consultation visit.   An After Visit Summary was printed and given to the patient.  __15____ minutes face to face time of which over 50% was spent in counseling.

## 2015-11-07 ENCOUNTER — Encounter: Payer: Self-pay | Admitting: Obstetrics and Gynecology

## 2015-11-07 ENCOUNTER — Ambulatory Visit (INDEPENDENT_AMBULATORY_CARE_PROVIDER_SITE_OTHER): Payer: Managed Care, Other (non HMO) | Admitting: Obstetrics and Gynecology

## 2015-11-07 VITALS — BP 92/68 | HR 80 | Resp 16 | Wt 113.0 lb

## 2015-11-07 DIAGNOSIS — F17200 Nicotine dependence, unspecified, uncomplicated: Secondary | ICD-10-CM

## 2015-11-07 DIAGNOSIS — A630 Anogenital (venereal) warts: Secondary | ICD-10-CM

## 2015-11-07 DIAGNOSIS — N891 Moderate vaginal dysplasia: Secondary | ICD-10-CM

## 2015-11-07 NOTE — Progress Notes (Signed)
Patient ID: Danielle Obrien, female   DOB: 06/01/1981, 34 y.o.   MRN: [id number] GYNECOLOGY  VISIT   HPI: 34 y.o.   Single  Caucasian  female   G1P0010 with No LMP recorded. Patient has had a hysterectomy.   here to follow up condyloma and vaginal dysplasia. Interested in surgical care.   Mother present for the discussion today.  She is hearing impaired and patient is signing to do interpretation for her mother.  Asking about her diagnosis now compared to in the past.   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  Smoker.   Cardiologist is Dr. Fransico Him.  Will plan for 10 days off from work.  GYNECOLOGIC HISTORY: No LMP recorded. Patient has had a hysterectomy. Contraception:Hysterectomy  Menopausal hormone therapy: None Last mammogram:Never Last pap smear: 09-18-15 LGSIL and positive HR HPV         OB History    Gravida Para Term Preterm AB TAB SAB Ectopic Multiple Living   1    1     0         Patient Active Problem List   Diagnosis Date Noted  . Tobacco abuse 09/27/2015  . Sinusitis, acute 08/20/2015  . Nonallopathic lesion of sacral region 04/10/2015  . SI (sacroiliac) joint dysfunction 02/20/2015  . Leg length discrepancy 02/20/2015  . Hip pain 01/07/2015  . Allergic rhinitis, cause unspecified 05/04/2014  . Aortic stenosis, moderate 02/20/2014  . Bipolar disorder ([place]) 02/05/2014  . Bicuspid aortic valve 01/27/2013  . Migraine with status migrainosus 01/27/2013  . Dyslipidemia (high LDL; low HDL) 01/27/2013  . Routine general medical examination at a health care facility 10/20/2011  . Mild persistent asthma 04/21/2011  . Essential hypertension, benign 04/21/2011    Past Medical History  Diagnosis Date  . Allergy   . Arthritis     In hips  . Asthma   . Hypertension   . Bicuspid aortic valve     moderate AS by echo 08/2015 with mean AVG 29mmHg and AVA 1.1cm2  . Anemia   . Alcohol abuse, in remission 2012  . Abnormal Pap  smear of cervix     --recurrent ascus w/Pos. HR HPV  . Bipolar disorder ([place])   . ADD (attention deficit disorder)   . Headache   . Tobacco abuse 09/27/2015    Past Surgical History  Procedure Laterality Date  . Hip surgery  1981    Hip Reset   . Tympanostomy tube placement  1981/1982  . Hernia repair  1981/1982  . Tonsillectomy and adenoidectomy  1990  . Hip surgery  1990    Plate was reconstructed/ took out growth plate in Right knee  . Hip surgery  1992    Plate removed in Left hip  . Anterior cruciate ligament repair  1993  . Lymph node biopsy  1995  . Nasal septum surgery  2002  . Abdominal hysterectomy  02/06/2009    Robotic total laparoscopic hysterectomy  . 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 Colposcopy  10/2008    CIN I & II  . Colposcopy  07/2000    Neg. ECC  . Colposcopy  06/2001    CIN I  . Colposcopy  08/2004    ECC--atypia  . Cardiac catheterization  June 2015    no CAD - moderate AS noted  . Left and right heart catheterization with coronary angiogram N/A 05/18/2014  Procedure: LEFT AND RIGHT HEART CATHETERIZATION WITH CORONARY ANGIOGRAM;  Surgeon: Jettie Booze, MD;  Location: [facility] CATH LAB;  Service: Cardiovascular;  Laterality: N/A;    Current Outpatient Prescriptions  Medication Sig Dispense Refill  . CALCIUM PO Take 2 tablets by mouth daily.    . Fluticasone-Salmeterol (ADVAIR) 250-50 MCG/DOSE AEPB Inhale 1 puff into the lungs 2 (two) times daily as needed (shortness of breath).    . gabapentin (NEURONTIN) 100 MG capsule Take 2 capsules (200 mg total) by mouth at bedtime. 60 capsule 3  . lamoTRIgine (LAMICTAL) 200 MG tablet TAKE 1 TABLET BY MOUTH TWICE DAILY 180 tablet 0  . losartan (COZAAR) 50 MG tablet TAKE 1 TABLET BY MOUTH DAILY.---NEED APPOINTMENT FOR ADDITIONAL REFILLS 30 tablet 5  . SUMAtriptan (IMITREX) 100 MG tablet Take 100 mg by mouth daily as needed for migraine. May repeat in 2 hours  if headache persists or recurs.    . traMADol (ULTRAM) 50 MG tablet Take 1 tablet (50 mg total) by mouth every 8 (eight) hours as needed for moderate pain or severe pain. 90 tablet 2   No current facility-administered medications for this visit.     ALLERGIES: Demerol; Morphine and related; and Codeine  Family History  Problem Relation Age of Onset  . Cancer Neg Hx   . Hyperlipidemia Mother   . Hypertension Mother   . Thyroid disease Mother   . Hyperlipidemia Father   . Hypertension Father   . Migraines Father   . Hypertension Brother   . Hyperlipidemia Brother   . Thyroid disease Maternal Grandmother   . Thyroid disease Maternal Grandfather     Social History   Social History  . Marital Status: Single    Spouse Name: N/A  . Number of Children: N/A  . Years of Education: N/A   Occupational History  . Not on file.   Social History Main Topics  . Smoking status: Current Every Day Smoker -- 0.25 packs/day for 12 years    Types: Cigarettes  . Smokeless tobacco: Never Used  . Alcohol Use: No  . Drug Use: No  . Sexual Activity:    Partners: Male    Birth Control/ Protection: Surgical     Comment: Hysterectomy   Other Topics Concern  . Not on file   Social History Narrative    ROS:  Pertinent items are noted in HPI.  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  Condyloma.  Aortic stenosis.  Smoker.   PLAN  Discussion of CIN III, VAIN II, condyloma, and HPV. Discussed smoking cessation and the positive effect on her HPV status.  Discussed options for tx of VAIN II and condyloma - ablation with medication vs. CO2 laser.  Discussed risks and benefits.  Discussed risk of bladder and bowel injury with CO2 laser. Patient prefers to do CO2 laser.  Will work on precert of colposcopy with biopsies of  vulva and vagina in combination with CO2 laser treatment.  Anesthesia and cardiology clearance.  May do well with a spinal and IV sedation. Patient and mother appreciative of the visit.  Patient did hug me at the end of the consultation to thank me. Return for preop visit.   An After Visit Summary was printed and given to the patient.  ___25___ minutes  face to face time of which over 50% was spent in counseling.

## 2015-11-13 ENCOUNTER — Telehealth: Payer: Self-pay | Admitting: Obstetrics and Gynecology

## 2015-11-13 NOTE — Telephone Encounter (Signed)
Call patient to discuss benefits for a procedure. Left Voicemail requesting a call back

## 2015-11-18 NOTE — Telephone Encounter (Signed)
Spoke with pt regarding benefit for surgery. Patient understood and agreeable. Patient states will called later this week or early next week to provide deposit and scheduled surgery. Patient is interested in an early February surgery date.d time. Pt aware professional benefit only and will be contacted by hospital for their benefits. Routing to sally for review, pending patient call back.

## 2015-11-22 NOTE — Telephone Encounter (Signed)
Patient's family member came in to office on 11/22/15 and paid total benefit. Patient ready to schedule. Surgery folder w/staff message to [facility]. Ok to close.

## 2015-12-01 DIAGNOSIS — Z8619 Personal history of other infectious and parasitic diseases: Secondary | ICD-10-CM

## 2015-12-01 HISTORY — DX: Personal history of other infectious and parasitic diseases: Z86.19

## 2015-12-04 ENCOUNTER — Telehealth: Payer: Self-pay | Admitting: Obstetrics and Gynecology

## 2015-12-04 NOTE — Telephone Encounter (Signed)
Patient calling to check the status of surgery scheduling and an appointment with her cardiologist for surgery clearance. Ok to leave detailed message.

## 2015-12-04 NOTE — Telephone Encounter (Signed)
Return call to patient. Left detailed message advising that first available date with Dr Quincy Simmonds is 12-24-15, need to know if this date will work for her. Left message to call back.

## 2015-12-05 ENCOUNTER — Telehealth: Payer: Self-pay | Admitting: Cardiology

## 2015-12-05 NOTE — Telephone Encounter (Signed)
Called Dr. Elza Rafter office - phones are off for the day. Will try again later.

## 2015-12-05 NOTE — Telephone Encounter (Signed)
Patient returning call.

## 2015-12-05 NOTE — Telephone Encounter (Signed)
New Message   1/5@ 1:29pm Gay Filler from [facility] office for a Surgical Clearance appt before February. I offered first available 12/06/15 with Turner she declined.   The next appt is months away for OV she got upset and stated that "I dont believe that for an established patient that their are no appts for months."  I offered PA appt because turner do not have any open appt. She wants a Dr.Turner appt and for Triage RN to call back with an appt.   Gay Filler [id number] Dr.Silva/Surgical Clearance-Vaginal Surgery/Insurance-[employer]/FAX/[place]

## 2015-12-05 NOTE — Telephone Encounter (Signed)
Return call to patient. Requesting February 14 as first date and alternate date of February 7. Patient needs February time due to work and having help during recovery. Advised neither of these currently available, will need to check with provider and hospital to see if any adjustments can be made and will call her back. Patient requests we make her appointment with cardiologist, Dr Golden Hurter.

## 2015-12-05 NOTE — Telephone Encounter (Signed)
Call to patient. Advised requested dates are not available. Offered 2-21 or 2-28. Or 12-24-15 is still available. Patietn to check with family and call back. Call to Dr Tressia Miners Turner's office for cardiac clearance appointment. No available appointments until March 2017. Request review by triage nurse. They will call back.

## 2015-12-06 ENCOUNTER — Telehealth: Payer: Self-pay | Admitting: *Deleted

## 2015-12-06 DIAGNOSIS — I350 Nonrheumatic aortic (valve) stenosis: Secondary | ICD-10-CM

## 2015-12-06 DIAGNOSIS — Z01818 Encounter for other preprocedural examination: Secondary | ICD-10-CM

## 2015-12-06 NOTE — Telephone Encounter (Signed)
Spoke to Harland German, RN from Dr Theodosia Blender office.  Patient was seen by Dr Radford Pax in late October, no new medications and was to return in 6 months. Recommends we send phone encounter requesting surgery clearance that she can send to Dr Radford Pax for her recommendations.  Phone note with surgery information sent to [facility].

## 2015-12-06 NOTE — Telephone Encounter (Signed)
To Dr. Turner.  

## 2015-12-06 NOTE — Telephone Encounter (Signed)
Cardiac clearance needed for out-patient surgical procedure. Colposcopy with vaginal and vulvar biopsies/CO2 laser application to VAIN II and vulvar condyloma. Dr Quincy Simmonds recommends spinal anesthesia with IV sedation. Procedure planned for mid February at [facility] pending cardiac clearance.  History of aortic stenosis and asthma. Last seen here 11-07-15, last annual exam 09-18-15. All notes and path reports in EPIC.  Please advise on Dr Theodosia Blender recommendations.

## 2015-12-06 NOTE — Telephone Encounter (Signed)
Patient called requesting to speak with Gay Filler about this.

## 2015-12-06 NOTE — Telephone Encounter (Signed)
Patient called requesting call back regarding cardiology appointment. Return call to patient. Left message to call back.

## 2015-12-06 NOTE — Telephone Encounter (Signed)
Spoke with Jaymes Graff Gay Filler) from Dr. Elza Rafter office. Informed Gay Filler that this patient was just seen a little over 2 months ago, and to send a cardiac clearance request via telephone encounter and it will be addressed.  Gay Filler agreed with plan.

## 2015-12-08 NOTE — Telephone Encounter (Signed)
Patient has moderate to severe AS but has been asymptomatic.  Please order an ETT with modified Bruce protocol to make sure patient has no CP, SOB and assess BP response to exercise.

## 2015-12-09 ENCOUNTER — Telehealth: Payer: Self-pay | Admitting: Internal Medicine

## 2015-12-09 NOTE — Telephone Encounter (Signed)
Thank you for the update and coordination of care for patient's surgery.  We will await cardiology clearance as needed per her provider.

## 2015-12-09 NOTE — Telephone Encounter (Signed)
Encounter closed

## 2015-12-09 NOTE — Telephone Encounter (Signed)
Call to patient to follow-up on date preference. Patient prefers 01-21-16.  Updated that message sent to Dr Theodosia Blender office and they will let me know what she needs for surgery clearance.  Anesthesia consult requested with case request.  Surgery instruction sheet reviewed and printed copy mailed to patient, see copy scanned to chart. Will update patient when response received from Dr Radford Pax.  Routing to provider for final review.

## 2015-12-09 NOTE — Telephone Encounter (Signed)
Informed patient GXT needs to be completed prior to Dr. Radford Pax giving cardiac clearance. Instructions for GXT reviewed and test ordered for scheduling. Patient agrees with treatment plan.

## 2015-12-18 ENCOUNTER — Telehealth (HOSPITAL_COMMUNITY): Payer: Self-pay

## 2015-12-18 NOTE — Telephone Encounter (Signed)
Encounter complete. 

## 2015-12-19 ENCOUNTER — Ambulatory Visit (HOSPITAL_COMMUNITY)
Admission: RE | Admit: 2015-12-19 | Discharge: 2015-12-19 | Disposition: A | Discharge status: Home / Self Care | Payer: Managed Care, Other (non HMO) | Source: Ambulatory Visit | Attending: Cardiovascular Disease | Admitting: Cardiovascular Disease

## 2015-12-19 DIAGNOSIS — I350 Nonrheumatic aortic (valve) stenosis: Secondary | ICD-10-CM | POA: Diagnosis not present

## 2015-12-19 DIAGNOSIS — R9439 Abnormal result of other cardiovascular function study: Secondary | ICD-10-CM | POA: Insufficient documentation

## 2015-12-19 DIAGNOSIS — Z01818 Encounter for other preprocedural examination: Secondary | ICD-10-CM | POA: Diagnosis not present

## 2015-12-19 LAB — EXERCISE TOLERANCE TEST
CHL CUP MPHR: 185 {beats}/min
CHL CUP RESTING HR STRESS: 71 {beats}/min
CHL RATE OF PERCEIVED EXERTION: 17
CSEPEDS: 29 s
Estimated workload: 12.5 METS
Exercise duration (min): 10 min
Peak HR: 164 {beats}/min
Percent HR: 88 %

## 2015-12-30 ENCOUNTER — Telehealth: Payer: Self-pay

## 2015-12-30 NOTE — Telephone Encounter (Signed)
Informed patient of results and verbal understanding expressed.  FU OV with Dr. Radford Pax scheduled 4/20. Clearance forwarded to Dr. Quincy Simmonds.

## 2015-12-30 NOTE — Telephone Encounter (Signed)
-----   Message from Sueanne Margarita, MD sent at 12/30/2015 11:34 AM EST ----- Patient has known normal coronary arteries by cath.  She has severe AS but no symptoms on treadmill at good workload.  SHe did have some EKG changes but resolved quickly in recovery with normal BP.  She is moderate risk (but not prohibitive) for spinal anesthesia.  Please make sure she has followup with me in April

## 2015-12-30 NOTE — Telephone Encounter (Signed)
This is the cardiology clearance note for patient's surgery.

## 2016-01-01 MED ORDER — LAMOTRIGINE 200 MG PO TABS: 200.0000 mg | ORAL_TABLET | Freq: Two times a day (BID) | ORAL | Status: DC

## 2016-01-01 NOTE — Telephone Encounter (Signed)
Notified pt md ok refill must keep appt for future refills

## 2016-01-01 NOTE — Telephone Encounter (Signed)
appt has been made for patient.  Can you refill the medication? She only has 1 pill left.  Krist[facility] Oppen[facility] on [address]

## 2016-01-01 NOTE — Telephone Encounter (Signed)
done

## 2016-01-01 NOTE — Addendum Note (Signed)
Addended by: Janith Lima on: 01/01/2016 11:53 AM   Modules accepted: Orders

## 2016-01-03 NOTE — Telephone Encounter (Signed)
Message faxed to [facility] for surgery chart.

## 2016-01-06 ENCOUNTER — Other Ambulatory Visit: Payer: Self-pay | Admitting: *Deleted

## 2016-01-06 ENCOUNTER — Telehealth: Payer: Self-pay | Admitting: *Deleted

## 2016-01-06 MED ORDER — TRAMADOL HCL 50 MG PO TABS: 50.0000 mg | ORAL_TABLET | Freq: Three times a day (TID) | ORAL | Status: DC | PRN

## 2016-01-06 NOTE — Telephone Encounter (Signed)
Refill done.  

## 2016-01-06 NOTE — Telephone Encounter (Signed)
Call to patient, notified surgery start time has been moved up to 1130 on 01-21-16, arrive at 1000 unless hospital directs otherwise.  Routing to provider for final review. Patient agreeable to disposition. Will close encounter.

## 2016-01-09 ENCOUNTER — Ambulatory Visit (INDEPENDENT_AMBULATORY_CARE_PROVIDER_SITE_OTHER): Payer: Managed Care, Other (non HMO) | Admitting: Obstetrics and Gynecology

## 2016-01-09 ENCOUNTER — Encounter: Payer: Self-pay | Admitting: Obstetrics and Gynecology

## 2016-01-09 VITALS — BP 112/64 | HR 70 | Ht 60.0 in | Wt 112.2 lb

## 2016-01-09 DIAGNOSIS — A630 Anogenital (venereal) warts: Secondary | ICD-10-CM

## 2016-01-09 DIAGNOSIS — N891 Moderate vaginal dysplasia: Secondary | ICD-10-CM | POA: Diagnosis not present

## 2016-01-09 DIAGNOSIS — L989 Disorder of the skin and subcutaneous tissue, unspecified: Secondary | ICD-10-CM

## 2016-01-09 NOTE — Progress Notes (Signed)
Patient ID: Danielle Obrien, female   DOB: 06/16/1981, 35 y.o.   MRN: [id number] GYNECOLOGY  VISIT   HPI: 35 y.o.   Single  Caucasian  female   [id number] with No LMP recorded. Patient has had a hysterectomy.   here for Surgical Consult.     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  Saw cardiology for clearance for surgery due to aortic stenosis.   Patient states she has a lot of anxiety about procedures.  Can take Vicodin and Percocet.  No change in health other than some rash on her chest and under the left breast.  Does not have a dermatologist.   GYNECOLOGIC HISTORY: No LMP recorded. Patient has had a hysterectomy. Contraception:Hysterectomy Menopausal hormone therapy: n/a Last mammogram: n/a Last pap smear: 09-18-15 LSIL:Pos HR HPV;colpo/vulvar bx 10-11-15--VAIN II and condyloma of vulva        OB History    Gravida Para Term Preterm AB TAB SAB Ectopic Multiple Living   1    1     0         Patient Active Problem List   Diagnosis Date Noted  . Tobacco abuse 09/27/2015  . Sinusitis, acute 08/20/2015  . Nonallopathic lesion of sacral region 04/10/2015  . SI (sacroiliac) joint dysfunction 02/20/2015  . Leg length discrepancy 02/20/2015  . Hip pain 01/07/2015  . Allergic rhinitis, cause unspecified 05/04/2014  . Aortic stenosis, moderate 02/20/2014  . Bipolar disorder ([place]) 02/05/2014  . Bicuspid aortic valve 01/27/2013  . Migraine with status migrainosus 01/27/2013  . Dyslipidemia (high LDL; low HDL) 01/27/2013  . Routine general medical examination at a health care facility 10/20/2011  . Mild persistent asthma 04/21/2011  . Essential hypertension, benign 04/21/2011    Past Medical History  Diagnosis Date  . Allergy   . Arthritis     In hips  . Asthma   . Hypertension   . Bicuspid aortic valve     moderate AS by echo 08/2015 with mean AVG 30mmHg and AVA 1.1cm2  . Anemia   . Alcohol abuse, in remission 2012  . Abnormal Pap smear of  cervix     --recurrent ascus w/Pos. HR HPV  . Bipolar disorder ([place])   . ADD (attention deficit disorder)   . Headache   . Tobacco abuse 09/27/2015    Past Surgical History  Procedure Laterality Date  . Hip surgery  1981    Hip Reset   . Tympanostomy tube placement  1981/1982  . Hernia repair  1981/1982  . Tonsillectomy and adenoidectomy  1990  . Hip surgery  1990    Plate was reconstructed/ took out growth plate in Right knee  . Hip surgery  1992    Plate removed in Left hip  . Anterior cruciate ligament repair  1993  . Lymph node biopsy  1995  . Nasal septum surgery  2002  . Abdominal hysterectomy  02/06/2009    Robotic total laparoscopic hysterectomy  . 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 Colposcopy  10/2008    CIN I & II  . Colposcopy  07/2000    Neg. ECC  . Colposcopy  06/2001    CIN I  . Colposcopy  08/2004    ECC--atypia  . Cardiac catheterization  June 2015    no CAD - moderate AS noted  . Left and right heart catheterization with coronary angiogram N/A 05/18/2014  Procedure: LEFT AND RIGHT HEART CATHETERIZATION WITH CORONARY ANGIOGRAM;  Surgeon: Jettie Booze, MD;  Location: [facility] CATH LAB;  Service: Cardiovascular;  Laterality: N/A;    Current Outpatient Prescriptions  Medication Sig Dispense Refill  . CALCIUM PO Take 2 tablets by mouth daily.    . Fluticasone-Salmeterol (ADVAIR) 250-50 MCG/DOSE AEPB Inhale 1 puff into the lungs 2 (two) times daily as needed (shortness of breath).    . gabapentin (NEURONTIN) 100 MG capsule Take 2 capsules (200 mg total) by mouth at bedtime. 60 capsule 3  . lamoTRIgine (LAMICTAL) 200 MG tablet Take 1 tablet (200 mg total) by mouth 2 (two) times daily. (Patient taking differently: Take 400 mg by mouth daily. ) 60 tablet 0  . Lansoprazole (PREVACID PO) Take 1 tablet by mouth daily as needed (indigestion).    Marland Kitchen losartan (COZAAR) 50 MG tablet TAKE 1 TABLET BY MOUTH DAILY.---NEED  APPOINTMENT FOR ADDITIONAL REFILLS 30 tablet 5  . Multiple Vitamin (MULTIVITAMIN WITH MINERALS) TABS tablet Take 1 tablet by mouth daily.    . SUMAtriptan (IMITREX) 100 MG tablet Take 100 mg by mouth daily as needed for migraine. May repeat in 2 hours if headache persists or recurs.    . traMADol (ULTRAM) 50 MG tablet Take 1 tablet (50 mg total) by mouth every 8 (eight) hours as needed for moderate pain or severe pain. 90 tablet 0   No current facility-administered medications for this visit.     ALLERGIES: Demerol; Morphine and related; and Codeine  Family History  Problem Relation Age of Onset  . Cancer Neg Hx   . Hyperlipidemia Mother   . Hypertension Mother   . Thyroid disease Mother   . Hyperlipidemia Father   . Hypertension Father   . Migraines Father   . Hypertension Brother   . Hyperlipidemia Brother   . Thyroid disease Maternal Grandmother   . Thyroid disease Maternal Grandfather     Social History   Social History  . Marital Status: Single    Spouse Name: N/A  . Number of Children: N/A  . Years of Education: N/A   Occupational History  . Not on file.   Social History Main Topics  . Smoking status: Current Every Day Smoker -- 0.25 packs/day for 12 years    Types: Cigarettes  . Smokeless tobacco: Never Used  . Alcohol Use: No  . Drug Use: No  . Sexual Activity:    Partners: Male    Birth Control/ Protection: Surgical     Comment: Hysterectomy   Other Topics Concern  . Not on file   Social History Narrative    ROS:  Pertinent items are noted in HPI.  PHYSICAL EXAMINATION:    BP 112/64 mmHg  Pulse 70  Ht 5' (1.524 m)  Wt 112 lb 3.2 oz (50.894 kg)  BMI 21.91 kg/m2    General appearance: alert, cooperative and appears stated age Head: Normocephalic, without obvious abnormality, atraumatic Neck: no adenopathy, supple, symmetrical, trachea midline and thyroid normal to inspection and palpation Lungs: clear to auscultation bilaterally Chest:   midchest and under left breast has circular tan-pink 0.1 - 1 cm skin lesions.  Ring worm? Heart: regular rate and rhythm Abdomen: soft, non-tender; bowel sounds normal; no masses,  no organomegaly Extremities: extremities normal, atraumatic, no cyanosis or edema Skin: Skin color, texture, turgor normal. No rashes or lesions Lymph nodes: Cervical, supraclavicular, and axillary nodes normal. No abnormal inguinal nodes palpated Neurologic: Grossly normal  Pelvic: External genitalia: Mon pubis with  large 1.5 cm multpigmented slightly raised area.  Vulva with multiple scattered condyloma.               Urethra:  normal appearing urethra with no masses, tenderness or lesions              Bartholins and Skenes: normal                 Vagina: normal appearing vagina with normal color and discharge, no lesions visible.  Irregularity of the mucosa palpable on anterior vaginal wall.               Cervix: absent.                Bimanual Exam:  Uterus:  uterus absent              Adnexa: normal adnexa and no mass, fullness, tenderness         Chaperone was present for exam.  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  Condyloma.  Aortic stenosis.  Smoker.  New skin lesions.  Possible ring worm?  PLAN  Plan for colposcopy with CO2 laser to vulvar condyloma and VAIN II and possible additional vulvar/vaginal biopsies. Risks, benefits and alternatives have been reviewed with the patient who wishes to proceed.  Risks include but are not limited to bleeding, infection, damage to surrounding organs, reaction to anesthesia, DVT, PE, death, recurrence of condyloma/vaginal dysplasia, and inability to remove the HPV. Surgical recovery and expectations discussed with the patient.  She has concerns about returning too soon to work due to the very physical nature of her job.  She will call the hospital to schedule her preop visit.  Patient understands that smoking cessation will help her to  control her the HPV. Referral to dermatology.  An After Visit Summary was printed and given to the patient.  ___15___ minutes face to face time of which over 50% was spent in counseling.

## 2016-01-11 ENCOUNTER — Encounter: Payer: Self-pay | Admitting: Obstetrics and Gynecology

## 2016-01-12 LAB — HM PAP SMEAR

## 2016-01-14 ENCOUNTER — Encounter: Payer: Self-pay | Admitting: Internal Medicine

## 2016-01-14 ENCOUNTER — Ambulatory Visit (INDEPENDENT_AMBULATORY_CARE_PROVIDER_SITE_OTHER): Payer: Managed Care, Other (non HMO) | Admitting: Internal Medicine

## 2016-01-14 ENCOUNTER — Other Ambulatory Visit (INDEPENDENT_AMBULATORY_CARE_PROVIDER_SITE_OTHER): Payer: Managed Care, Other (non HMO)

## 2016-01-14 VITALS — BP 120/80 | HR 77 | Temp 98.7°F | Resp 16 | Ht 60.0 in | Wt 116.0 lb

## 2016-01-14 DIAGNOSIS — E785 Hyperlipidemia, unspecified: Secondary | ICD-10-CM | POA: Diagnosis not present

## 2016-01-14 DIAGNOSIS — Z0000 Encounter for general adult medical examination without abnormal findings: Secondary | ICD-10-CM | POA: Diagnosis not present

## 2016-01-14 DIAGNOSIS — F3177 Bipolar disorder, in partial remission, most recent episode mixed: Secondary | ICD-10-CM

## 2016-01-14 LAB — CBC WITH DIFFERENTIAL/PLATELET
BASOS ABS: 0 10*3/uL (ref 0.0–0.1)
Basophils Relative: 0.6 % (ref 0.0–3.0)
EOS ABS: 0.2 10*3/uL (ref 0.0–0.7)
Eosinophils Relative: 2.7 % (ref 0.0–5.0)
HEMATOCRIT: 41.9 % (ref 36.0–46.0)
Hemoglobin: 14.3 g/dL (ref 12.0–15.0)
LYMPHS ABS: 2.2 10*3/uL (ref 0.7–4.0)
LYMPHS PCT: 34.3 % (ref 12.0–46.0)
MCHC: 34.2 g/dL (ref 30.0–36.0)
MCV: 90.7 fl (ref 78.0–100.0)
MONOS PCT: 6 % (ref 3.0–12.0)
Monocytes Absolute: 0.4 10*3/uL (ref 0.1–1.0)
NEUTROS ABS: 3.7 10*3/uL (ref 1.4–7.7)
NEUTROS PCT: 56.4 % (ref 43.0–77.0)
PLATELETS: 131 10*3/uL — AB (ref 150.0–400.0)
RBC: 4.62 Mil/uL (ref 3.87–5.11)
RDW: 14 % (ref 11.5–15.5)
WBC: 6.5 10*3/uL (ref 4.0–10.5)

## 2016-01-14 LAB — COMPREHENSIVE METABOLIC PANEL
ALK PHOS: 81 U/L (ref 39–117)
ALT: 19 U/L (ref 0–35)
AST: 19 U/L (ref 0–37)
Albumin: 4.7 g/dL (ref 3.5–5.2)
BILIRUBIN TOTAL: 0.3 mg/dL (ref 0.2–1.2)
BUN: 11 mg/dL (ref 6–23)
CALCIUM: 9.9 mg/dL (ref 8.4–10.5)
CO2: 28 meq/L (ref 19–32)
CREATININE: 0.81 mg/dL (ref 0.40–1.20)
Chloride: 104 mEq/L (ref 96–112)
GFR: 85.19 mL/min (ref >60.00)
Glucose, Bld: 77 mg/dL (ref 70–99)
Potassium: 4.5 mEq/L (ref 3.5–5.1)
SODIUM: 138 meq/L (ref 135–145)
TOTAL PROTEIN: 7.2 g/dL (ref 6.0–8.3)

## 2016-01-14 LAB — LIPID PANEL
Cholesterol: 247 mg/dL — ABNORMAL HIGH (ref 0–200)
HDL: 76.5 mg/dL (ref >39.00)
LDL CALC: 153 mg/dL — AB (ref 0–99)
NONHDL: 170.05
Total CHOL/HDL Ratio: 3
Triglycerides: 83 mg/dL (ref 0.0–149.0)
VLDL: 16.6 mg/dL (ref 0.0–40.0)

## 2016-01-14 LAB — TSH: TSH: 0.64 u[IU]/mL (ref 0.35–4.50)

## 2016-01-14 MED ORDER — LAMOTRIGINE 200 MG PO TABS: 400.0000 mg | ORAL_TABLET | Freq: Every day | ORAL | Status: DC

## 2016-01-14 NOTE — Progress Notes (Signed)
Subjective:  Patient ID: Danielle Obrien, female    DOB: [birth year]  Age: 36 y.o. MRN: [id number]  CC: Annual Exam; Depression; and Asthma   HPI Danielle Obrien presents for a complete physical as well as follow-up on asthma and depression. She has a history of bipolar disease and takes gabapentin and lamotrigine. She currently takes 40 mg of lamotrigine a day and says it has done a great job of balancing out her mood. She reports no episodes of high moods or low moods. She is maintaining her job and getting along well with her coworkers and friends. She requests to stay on this combination. She also reports her asthma has been well controlled with the daily inhalation of an ICS/LABA combo.   Outpatient Prescriptions Prior to Visit  Medication Sig Dispense Refill  . CALCIUM PO Take 2 tablets by mouth daily.    . Fluticasone-Salmeterol (ADVAIR) 250-50 MCG/DOSE AEPB Inhale 1 puff into the lungs 2 (two) times daily as needed (shortness of breath).    . gabapentin (NEURONTIN) 100 MG capsule Take 2 capsules (200 mg total) by mouth at bedtime. 60 capsule 3  . Lansoprazole (PREVACID PO) Take 1 tablet by mouth daily as needed (indigestion).    Marland Kitchen losartan (COZAAR) 50 MG tablet TAKE 1 TABLET BY MOUTH DAILY.---NEED APPOINTMENT FOR ADDITIONAL REFILLS 30 tablet 5  . Multiple Vitamin (MULTIVITAMIN WITH MINERALS) TABS tablet Take 1 tablet by mouth daily.    . SUMAtriptan (IMITREX) 100 MG tablet Take 100 mg by mouth daily as needed for migraine. May repeat in 2 hours if headache persists or recurs.    . traMADol (ULTRAM) 50 MG tablet Take 1 tablet (50 mg total) by mouth every 8 (eight) hours as needed for moderate pain or severe pain. 90 tablet 0  . lamoTRIgine (LAMICTAL) 200 MG tablet Take 1 tablet (200 mg total) by mouth 2 (two) times daily. (Patient taking differently: Take 400 mg by mouth daily. ) 60 tablet 0   No facility-administered medications prior to visit.    ROS Review of Systems    Constitutional: Negative.  Negative for appetite change and unexpected weight change.  HENT: Negative.   Eyes: Negative.   Respiratory: Negative.  Negative for cough, choking, chest tightness, shortness of breath and stridor.   Cardiovascular: Negative.  Negative for chest pain, palpitations and leg swelling.  Gastrointestinal: Negative.  Negative for nausea, vomiting, abdominal pain, diarrhea, constipation and blood in stool.  Endocrine: Negative.   Genitourinary: Negative.  Negative for difficulty urinating.  Musculoskeletal: Negative.  Negative for myalgias, back pain, arthralgias and neck pain.  Skin: Negative.  Negative for color change and rash.  Allergic/Immunologic: Negative.   Neurological: Negative.  Negative for dizziness, syncope, speech difficulty, weakness, light-headedness, numbness and headaches.  Hematological: Negative.  Negative for adenopathy. Does not bruise/bleed easily.  Psychiatric/Behavioral: Negative.     Objective:  BP 120/80 mmHg  Pulse 77  Temp(Src) 98.7 F (37.1 C) (Oral)  Resp 16  Ht 5' (1.524 m)  Wt 116 lb (52.617 kg)  BMI 22.65 kg/m2  SpO2 99%  BP Readings from Last 3 Encounters:  01/14/16 120/80  01/09/16 112/64  11/07/15 92/68    Wt Readings from Last 3 Encounters:  01/14/16 116 lb (52.617 kg)  01/09/16 112 lb 3.2 oz (50.894 kg)  11/07/15 113 lb (51.256 kg)    Physical Exam  Constitutional: She is oriented to person, place, and time. She appears well-developed and well-nourished. No distress.  HENT:  Mouth/Throat:  Oropharynx is clear and moist.  Eyes: Conjunctivae are normal. Right eye exhibits no discharge. Left eye exhibits no discharge. No scleral icterus.  Neck: Normal range of motion. Neck supple. No JVD present. No tracheal deviation present. No thyromegaly present.  Cardiovascular: Normal rate, regular rhythm, S1 normal, S2 normal and intact distal pulses.  Exam reveals no gallop, no S3, no S4 and no friction rub.   Murmur  heard.  Systolic murmur is present with a grade of 2/6   No diastolic murmur is present  Pulses:      Carotid pulses are 1+ on the right side, and 1+ on the left side.      Radial pulses are 1+ on the right side, and 1+ on the left side.       Femoral pulses are 1+ on the right side, and 1+ on the left side.      Popliteal pulses are 1+ on the right side, and 1+ on the left side.       Dorsalis pedis pulses are 1+ on the right side, and 1+ on the left side.       Posterior tibial pulses are 1+ on the right side, and 1+ on the left side.  Pulmonary/Chest: Effort normal and breath sounds normal. No stridor. No respiratory distress. She has no wheezes. She has no rales. She exhibits no tenderness.  Abdominal: Soft. Bowel sounds are normal. She exhibits no distension and no mass. There is no tenderness. There is no rebound and no guarding.  Musculoskeletal: Normal range of motion. She exhibits no edema or tenderness.  Lymphadenopathy:    She has no cervical adenopathy.  Neurological: She is oriented to person, place, and time.  Skin: Skin is warm and dry. No rash noted. She is not diaphoretic. No erythema. No pallor.  Psychiatric: She has a normal mood and affect. Her behavior is normal. Judgment and thought content normal.    Lab Results  Component Value Date   WBC 6.5 01/14/2016   HGB 14.3 01/14/2016   HCT 41.9 01/14/2016   PLT 131.0* 01/14/2016   GLUCOSE 77 01/14/2016   CHOL 247* 01/14/2016   TRIG 83.0 01/14/2016   HDL 76.50 01/14/2016   LDLDIRECT 171.0 01/27/2013   LDLCALC 153* 01/14/2016   ALT 19 01/14/2016   AST 19 01/14/2016   NA 138 01/14/2016   K 4.5 01/14/2016   CL 104 01/14/2016   CREATININE 0.81 01/14/2016   BUN 11 01/14/2016   CO2 28 01/14/2016   TSH 0.64 01/14/2016   INR 0.9 05/15/2014    No results found.  Assessment & Plan:   Danielle Obrien was seen today for annual exam, depression and asthma.  Diagnoses and all orders for this visit:  Routine general  medical examination at a health care facility - she refused a flu vaccine today, her Pap smears up-to-date, exam completed, labs ordered and reviewed, she was given patient education material. -     Lipid panel; Future -     Comprehensive metabolic panel; Future -     CBC with Differential/Platelet; Future -     TSH; Future  Hyperlipidemia with target LDL less than 130 - her Framingham risk score is only 8% so at this time I do not recommend statin therapy.  Bipolar disorder, in partial remission, most recent episode mixed ([place])- she is doing well on the current combination of gabapentin and lamotrigine, will continue at the current doses. Labs today do not show any metabolic or organic toxicity  related to this therapy.  Other orders -     lamoTRIgine (LAMICTAL) 200 MG tablet; Take 2 tablets (400 mg total) by mouth daily.  I have changed Ms. Stiehl's lamoTRIgine. I am also having her maintain her CALCIUM PO, Fluticasone-Salmeterol, SUMAtriptan, losartan, gabapentin, traMADol, Lansoprazole (PREVACID PO), and multivitamin with minerals.  Meds ordered this encounter  Medications  . lamoTRIgine (LAMICTAL) 200 MG tablet    Sig: Take 2 tablets (400 mg total) by mouth daily.    Dispense:  180 tablet    Refill:  3     Follow-up: Return in about 6 months (around 07/13/2016).  [name], MD

## 2016-01-14 NOTE — Patient Instructions (Signed)
Preventive Care for Adults, Female A healthy lifestyle and preventive care can promote health and wellness. Preventive health guidelines for women include the following key practices.  A routine yearly physical is a good way to check with your health care provider about your health and preventive screening. It is a chance to share any concerns and updates on your health and to receive a thorough exam.  Visit your dentist for a routine exam and preventive care every 6 months. Brush your teeth twice a day and floss once a day. Good oral hygiene prevents tooth decay and gum disease.  The frequency of eye exams is based on your age, health, family medical history, use of contact lenses, and other factors. Follow your health care provider's recommendations for frequency of eye exams.  Eat a healthy diet. Foods like vegetables, fruits, whole grains, low-fat dairy products, and lean protein foods contain the nutrients you need without too many calories. Decrease your intake of foods high in solid fats, added sugars, and salt. Eat the right amount of calories for you.Get information about a proper diet from your health care provider, if necessary.  Regular physical exercise is one of the most important things you can do for your health. Most adults should get at least 150 minutes of moderate-intensity exercise (any activity that increases your heart rate and causes you to sweat) each week. In addition, most adults need muscle-strengthening exercises on 2 or more days a week.  Maintain a healthy weight. The body mass index (BMI) is a screening tool to identify possible weight problems. It provides an estimate of body fat based on height and weight. Your health care provider can find your BMI and can help you achieve or maintain a healthy weight.For adults 20 years and older:  A BMI below 18.5 is considered underweight.  A BMI of 18.5 to 24.9 is normal.  A BMI of 25 to 29.9 is considered overweight.  A  BMI of 30 and above is considered obese.  Maintain normal blood lipids and cholesterol levels by exercising and minimizing your intake of saturated fat. Eat a balanced diet with plenty of fruit and vegetables. Blood tests for lipids and cholesterol should begin at age 45 and be repeated every 5 years. If your lipid or cholesterol levels are high, you are over 50, or you are at high risk for heart disease, you may need your cholesterol levels checked more frequently.Ongoing high lipid and cholesterol levels should be treated with medicines if diet and exercise are not working.  If you smoke, find out from your health care provider how to quit. If you do not use tobacco, do not start.  Lung cancer screening is recommended for adults aged 45-80 years who are at high risk for developing lung cancer because of a history of smoking. A yearly low-dose CT scan of the lungs is recommended for people who have at least a 30-pack-year history of smoking and are a current smoker or have quit within the past 15 years. A pack year of smoking is smoking an average of 1 pack of cigarettes a day for 1 year (for example: 1 pack a day for 30 years or 2 packs a day for 15 years). Yearly screening should continue until the smoker has stopped smoking for at least 15 years. Yearly screening should be stopped for people who develop a health problem that would prevent them from having lung cancer treatment.  If you are pregnant, do not drink alcohol. If you are  breastfeeding, be very cautious about drinking alcohol. If you are not pregnant and choose to drink alcohol, do not have more than 1 drink per day. One drink is considered to be 12 ounces (355 mL) of beer, 5 ounces (148 mL) of wine, or 1.5 ounces (44 mL) of liquor.  Avoid use of street drugs. Do not share needles with anyone. Ask for help if you need support or instructions about stopping the use of drugs.  High blood pressure causes heart disease and increases the risk  of stroke. Your blood pressure should be checked at least every 1 to 2 years. Ongoing high blood pressure should be treated with medicines if weight loss and exercise do not work.  If you are 55-79 years old, ask your health care provider if you should take aspirin to prevent strokes.  Diabetes screening is done by taking a blood sample to check your blood glucose level after you have not eaten for a certain period of time (fasting). If you are not overweight and you do not have risk factors for diabetes, you should be screened once every 3 years starting at age 45. If you are overweight or obese and you are 40-70 years of age, you should be screened for diabetes every year as part of your cardiovascular risk assessment.  Breast cancer screening is essential preventive care for women. You should practice "breast self-awareness." This means understanding the normal appearance and feel of your breasts and may include breast self-examination. Any changes detected, no matter how small, should be reported to a health care provider. Women in their 20s and 30s should have a clinical breast exam (CBE) by a health care provider as part of a regular health exam every 1 to 3 years. After age 40, women should have a CBE every year. Starting at age 40, women should consider having a mammogram (breast X-ray test) every year. Women who have a family history of breast cancer should talk to their health care provider about genetic screening. Women at a high risk of breast cancer should talk to their health care providers about having an MRI and a mammogram every year.  Breast cancer gene (BRCA)-related cancer risk assessment is recommended for women who have family members with BRCA-related cancers. BRCA-related cancers include breast, ovarian, tubal, and peritoneal cancers. Having family members with these cancers may be associated with an increased risk for harmful changes (mutations) in the breast cancer genes BRCA1 and  BRCA2. Results of the assessment will determine the need for genetic counseling and BRCA1 and BRCA2 testing.  Your health care provider may recommend that you be screened regularly for cancer of the pelvic organs (ovaries, uterus, and vagina). This screening involves a pelvic examination, including checking for microscopic changes to the surface of your cervix (Pap test). You may be encouraged to have this screening done every 3 years, beginning at age 21.  For women ages 30-65, health care providers may recommend pelvic exams and Pap testing every 3 years, or they may recommend the Pap and pelvic exam, combined with testing for human papilloma virus (HPV), every 5 years. Some types of HPV increase your risk of cervical cancer. Testing for HPV may also be done on women of any age with unclear Pap test results.  Other health care providers may not recommend any screening for nonpregnant women who are considered low risk for pelvic cancer and who do not have symptoms. Ask your health care provider if a screening pelvic exam is right for   you.  If you have had past treatment for cervical cancer or a condition that could lead to cancer, you need Pap tests and screening for cancer for at least 20 years after your treatment. If Pap tests have been discontinued, your risk factors (such as having a new sexual partner) need to be reassessed to determine if screening should resume. Some women have medical problems that increase the chance of getting cervical cancer. In these cases, your health care provider may recommend more frequent screening and Pap tests.  Colorectal cancer can be detected and often prevented. Most routine colorectal cancer screening begins at the age of 50 years and continues through age 75 years. However, your health care provider may recommend screening at an earlier age if you have risk factors for colon cancer. On a yearly basis, your health care provider may provide home test kits to check  for hidden blood in the stool. Use of a small camera at the end of a tube, to directly examine the colon (sigmoidoscopy or colonoscopy), can detect the earliest forms of colorectal cancer. Talk to your health care provider about this at age 50, when routine screening begins. Direct exam of the colon should be repeated every 5-10 years through age 75 years, unless early forms of precancerous polyps or small growths are found.  People who are at an increased risk for hepatitis B should be screened for this virus. You are considered at high risk for hepatitis B if:  You were born in a country where hepatitis B occurs often. Talk with your health care provider about which countries are considered high risk.  Your parents were born in a high-risk country and you have not received a shot to protect against hepatitis B (hepatitis B vaccine).  You have HIV or AIDS.  You use needles to inject street drugs.  You live with, or have sex with, someone who has hepatitis B.  You get hemodialysis treatment.  You take certain medicines for conditions like cancer, organ transplantation, and autoimmune conditions.  Hepatitis C blood testing is recommended for all people born from 1945 through 1965 and any individual with known risks for hepatitis C.  Practice safe sex. Use condoms and avoid high-risk sexual practices to reduce the spread of sexually transmitted infections (STIs). STIs include gonorrhea, chlamydia, syphilis, trichomonas, herpes, HPV, and human immunodeficiency virus (HIV). Herpes, HIV, and HPV are viral illnesses that have no cure. They can result in disability, cancer, and death.  You should be screened for sexually transmitted illnesses (STIs) including gonorrhea and chlamydia if:  You are sexually active and are younger than 24 years.  You are older than 24 years and your health care provider tells you that you are at risk for this type of infection.  Your sexual activity has changed  since you were last screened and you are at an increased risk for chlamydia or gonorrhea. Ask your health care provider if you are at risk.  If you are at risk of being infected with HIV, it is recommended that you take a prescription medicine daily to prevent HIV infection. This is called preexposure prophylaxis (PrEP). You are considered at risk if:  You are sexually active and do not regularly use condoms or know the HIV status of your partner(s).  You take drugs by injection.  You are sexually active with a partner who has HIV.  Talk with your health care provider about whether you are at high risk of being infected with HIV. If   you choose to begin PrEP, you should first be tested for HIV. You should then be tested every 3 months for as long as you are taking PrEP.  Osteoporosis is a disease in which the bones lose minerals and strength with aging. This can result in serious bone fractures or breaks. The risk of osteoporosis can be identified using a bone density scan. Women ages 67 years and over and women at risk for fractures or osteoporosis should discuss screening with their health care providers. Ask your health care provider whether you should take a calcium supplement or vitamin D to reduce the rate of osteoporosis.  Menopause can be associated with physical symptoms and risks. Hormone replacement therapy is available to decrease symptoms and risks. You should talk to your health care provider about whether hormone replacement therapy is right for you.  Use sunscreen. Apply sunscreen liberally and repeatedly throughout the day. You should seek shade when your shadow is shorter than you. Protect yourself by wearing long sleeves, pants, a wide-brimmed hat, and sunglasses year round, whenever you are outdoors.  Once a month, do a whole body skin exam, using a mirror to look at the skin on your back. Tell your health care provider of new moles, moles that have irregular borders, moles that  are larger than a pencil eraser, or moles that have changed in shape or color.  Stay current with required vaccines (immunizations).  Influenza vaccine. All adults should be immunized every year.  Tetanus, diphtheria, and acellular pertussis (Td, Tdap) vaccine. Pregnant women should receive 1 dose of Tdap vaccine during each pregnancy. The dose should be obtained regardless of the length of time since the last dose. Immunization is preferred during the 27th-36th week of gestation. An adult who has not previously received Tdap or who does not know her vaccine status should receive 1 dose of Tdap. This initial dose should be followed by tetanus and diphtheria toxoids (Td) booster doses every 10 years. Adults with an unknown or incomplete history of completing a 3-dose immunization series with Td-containing vaccines should begin or complete a primary immunization series including a Tdap dose. Adults should receive a Td booster every 10 years.  Varicella vaccine. An adult without evidence of immunity to varicella should receive 2 doses or a second dose if she has previously received 1 dose. Pregnant females who do not have evidence of immunity should receive the first dose after pregnancy. This first dose should be obtained before leaving the health care facility. The second dose should be obtained 4-8 weeks after the first dose.  Human papillomavirus (HPV) vaccine. Females aged 13-26 years who have not received the vaccine previously should obtain the 3-dose series. The vaccine is not recommended for use in pregnant females. However, pregnancy testing is not needed before receiving a dose. If a female is found to be pregnant after receiving a dose, no treatment is needed. In that case, the remaining doses should be delayed until after the pregnancy. Immunization is recommended for any person with an immunocompromised condition through the age of 61 years if she did not get any or all doses earlier. During the  3-dose series, the second dose should be obtained 4-8 weeks after the first dose. The third dose should be obtained 24 weeks after the first dose and 16 weeks after the second dose.  Zoster vaccine. One dose is recommended for adults aged 30 years or older unless certain conditions are present.  Measles, mumps, and rubella (MMR) vaccine. Adults born  before 1957 generally are considered immune to measles and mumps. Adults born in 1957 or later should have 1 or more doses of MMR vaccine unless there is a contraindication to the vaccine or there is laboratory evidence of immunity to each of the three diseases. A routine second dose of MMR vaccine should be obtained at least 28 days after the first dose for students attending postsecondary schools, health care workers, or international travelers. People who received inactivated measles vaccine or an unknown type of measles vaccine during 1963-1967 should receive 2 doses of MMR vaccine. People who received inactivated mumps vaccine or an unknown type of mumps vaccine before 1979 and are at high risk for mumps infection should consider immunization with 2 doses of MMR vaccine. For females of childbearing age, rubella immunity should be determined. If there is no evidence of immunity, females who are not pregnant should be vaccinated. If there is no evidence of immunity, females who are pregnant should delay immunization until after pregnancy. Unvaccinated health care workers born before 1957 who lack laboratory evidence of measles, mumps, or rubella immunity or laboratory confirmation of disease should consider measles and mumps immunization with 2 doses of MMR vaccine or rubella immunization with 1 dose of MMR vaccine.  Pneumococcal 13-valent conjugate (PCV13) vaccine. When indicated, a person who is uncertain of his immunization history and has no record of immunization should receive the PCV13 vaccine. All adults 65 years of age and older should receive this  vaccine. An adult aged 19 years or older who has certain medical conditions and has not been previously immunized should receive 1 dose of PCV13 vaccine. This PCV13 should be followed with a dose of pneumococcal polysaccharide (PPSV23) vaccine. Adults who are at high risk for pneumococcal disease should obtain the PPSV23 vaccine at least 8 weeks after the dose of PCV13 vaccine. Adults older than 36 years of age who have normal immune system function should obtain the PPSV23 vaccine dose at least 1 year after the dose of PCV13 vaccine.  Pneumococcal polysaccharide (PPSV23) vaccine. When PCV13 is also indicated, PCV13 should be obtained first. All adults aged 65 years and older should be immunized. An adult younger than age 65 years who has certain medical conditions should be immunized. Any person who resides in a nursing home or long-term care facility should be immunized. An adult smoker should be immunized. People with an immunocompromised condition and certain other conditions should receive both PCV13 and PPSV23 vaccines. People with human immunodeficiency virus (HIV) infection should be immunized as soon as possible after diagnosis. Immunization during chemotherapy or radiation therapy should be avoided. Routine use of PPSV23 vaccine is not recommended for American Indians, [place] Natives, or people younger than 65 years unless there are medical conditions that require PPSV23 vaccine. When indicated, people who have unknown immunization and have no record of immunization should receive PPSV23 vaccine. One-time revaccination 5 years after the first dose of PPSV23 is recommended for people aged 19-64 years who have chronic kidney failure, nephrotic syndrome, asplenia, or immunocompromised conditions. People who received 1-2 doses of PPSV23 before age 65 years should receive another dose of PPSV23 vaccine at age 65 years or later if at least 5 years have passed since the previous dose. Doses of PPSV23 are not  needed for people immunized with PPSV23 at or after age 65 years.  Meningococcal vaccine. Adults with asplenia or persistent complement component deficiencies should receive 2 doses of quadrivalent meningococcal conjugate (MenACWY-D) vaccine. The doses should be obtained   at least 2 months apart. Microbiologists working with certain meningococcal bacteria, [place] recruits, people at risk during an outbreak, and people who travel to or live in countries with a high rate of meningitis should be immunized. A first-year college student up through age 34 years who is living in a residence hall should receive a dose if she did not receive a dose on or after her 16th birthday. Adults who have certain high-risk conditions should receive one or more doses of vaccine.  Hepatitis A vaccine. Adults who wish to be protected from this disease, have certain high-risk conditions, work with hepatitis A-infected animals, work in hepatitis A research labs, or travel to or work in countries with a high rate of hepatitis A should be immunized. Adults who were previously unvaccinated and who anticipate close contact with an international adoptee during the first 60 days after arrival in the [place] from a country with a high rate of hepatitis A should be immunized.  Hepatitis B vaccine. Adults who wish to be protected from this disease, have certain high-risk conditions, may be exposed to blood or other infectious body fluids, are household contacts or sex partners of hepatitis B positive people, are clients or workers in certain care facilities, or travel to or work in countries with a high rate of hepatitis B should be immunized.  Haemophilus influenzae type b (Hib) vaccine. A previously unvaccinated person with asplenia or sickle cell disease or having a scheduled splenectomy should receive 1 dose of Hib vaccine. Regardless of previous immunization, a recipient of a hematopoietic stem cell transplant should receive a  3-dose series 6-12 months after her successful transplant. Hib vaccine is not recommended for adults with HIV infection. Preventive Services / Frequency Ages 35 to 4 years  Blood pressure check.** / Every 3-5 years.  Lipid and cholesterol check.** / Every 5 years beginning at age 60.  Clinical breast exam.** / Every 3 years for women in their 71s and 10s.  BRCA-related cancer risk assessment.** / For women who have family members with a BRCA-related cancer (breast, ovarian, tubal, or peritoneal cancers).  Pap test.** / Every 2 years from ages 76 through 26. Every 3 years starting at age 61 through age 76 or 93 with a history of 3 consecutive normal Pap tests.  HPV screening.** / Every 3 years from ages 37 through ages 60 to 51 with a history of 3 consecutive normal Pap tests.  Hepatitis C blood test.** / For any individual with known risks for hepatitis C.  Skin self-exam. / Monthly.  Influenza vaccine. / Every year.  Tetanus, diphtheria, and acellular pertussis (Tdap, Td) vaccine.** / Consult your health care provider. Pregnant women should receive 1 dose of Tdap vaccine during each pregnancy. 1 dose of Td every 10 years.  Varicella vaccine.** / Consult your health care provider. Pregnant females who do not have evidence of immunity should receive the first dose after pregnancy.  HPV vaccine. / 3 doses over 6 months, if 93 and younger. The vaccine is not recommended for use in pregnant females. However, pregnancy testing is not needed before receiving a dose.  Measles, mumps, rubella (MMR) vaccine.** / You need at least 1 dose of MMR if you were born in 1957 or later. You may also need a 2nd dose. For females of childbearing age, rubella immunity should be determined. If there is no evidence of immunity, females who are not pregnant should be vaccinated. If there is no evidence of immunity, females who are  pregnant should delay immunization until after pregnancy.  Pneumococcal  13-valent conjugate (PCV13) vaccine.** / Consult your health care provider.  Pneumococcal polysaccharide (PPSV23) vaccine.** / 1 to 2 doses if you smoke cigarettes or if you have certain conditions.  Meningococcal vaccine.** / 1 dose if you are age 68 to 8 years and a Market researcher living in a residence hall, or have one of several medical conditions, you need to get vaccinated against meningococcal disease. You may also need additional booster doses.  Hepatitis A vaccine.** / Consult your health care provider.  Hepatitis B vaccine.** / Consult your health care provider.  Haemophilus influenzae type b (Hib) vaccine.** / Consult your health care provider. Ages 7 to 53 years  Blood pressure check.** / Every year.  Lipid and cholesterol check.** / Every 5 years beginning at age 25 years.  Lung cancer screening. / Every year if you are aged 11-80 years and have a 30-pack-year history of smoking and currently smoke or have quit within the past 15 years. Yearly screening is stopped once you have quit smoking for at least 15 years or develop a health problem that would prevent you from having lung cancer treatment.  Clinical breast exam.** / Every year after age 48 years.  BRCA-related cancer risk assessment.** / For women who have family members with a BRCA-related cancer (breast, ovarian, tubal, or peritoneal cancers).  Mammogram.** / Every year beginning at age 41 years and continuing for as long as you are in good health. Consult with your health care provider.  Pap test.** / Every 3 years starting at age 65 years through age 37 or 70 years with a history of 3 consecutive normal Pap tests.  HPV screening.** / Every 3 years from ages 72 years through ages 60 to 40 years with a history of 3 consecutive normal Pap tests.  Fecal occult blood test (FOBT) of stool. / Every year beginning at age 21 years and continuing until age 5 years. You may not need to do this test if you get  a colonoscopy every 10 years.  Flexible sigmoidoscopy or colonoscopy.** / Every 5 years for a flexible sigmoidoscopy or every 10 years for a colonoscopy beginning at age 35 years and continuing until age 48 years.  Hepatitis C blood test.** / For all people born from [birth year] through 1965 and any individual with known risks for hepatitis C.  Skin self-exam. / Monthly.  Influenza vaccine. / Every year.  Tetanus, diphtheria, and acellular pertussis (Tdap/Td) vaccine.** / Consult your health care provider. Pregnant women should receive 1 dose of Tdap vaccine during each pregnancy. 1 dose of Td every 10 years.  Varicella vaccine.** / Consult your health care provider. Pregnant females who do not have evidence of immunity should receive the first dose after pregnancy.  Zoster vaccine.** / 1 dose for adults aged 30 years or older.  Measles, mumps, rubella (MMR) vaccine.** / You need at least 1 dose of MMR if you were born in 1957 or later. You may also need a second dose. For females of childbearing age, rubella immunity should be determined. If there is no evidence of immunity, females who are not pregnant should be vaccinated. If there is no evidence of immunity, females who are pregnant should delay immunization until after pregnancy.  Pneumococcal 13-valent conjugate (PCV13) vaccine.** / Consult your health care provider.  Pneumococcal polysaccharide (PPSV23) vaccine.** / 1 to 2 doses if you smoke cigarettes or if you have certain conditions.  Meningococcal vaccine.** /  Consult your health care provider.  Hepatitis A vaccine.** / Consult your health care provider.  Hepatitis B vaccine.** / Consult your health care provider.  Haemophilus influenzae type b (Hib) vaccine.** / Consult your health care provider. Ages 64 years and over  Blood pressure check.** / Every year.  Lipid and cholesterol check.** / Every 5 years beginning at age 23 years.  Lung cancer screening. / Every year if you  are aged 16-80 years and have a 30-pack-year history of smoking and currently smoke or have quit within the past 15 years. Yearly screening is stopped once you have quit smoking for at least 15 years or develop a health problem that would prevent you from having lung cancer treatment.  Clinical breast exam.** / Every year after age 74 years.  BRCA-related cancer risk assessment.** / For women who have family members with a BRCA-related cancer (breast, ovarian, tubal, or peritoneal cancers).  Mammogram.** / Every year beginning at age 44 years and continuing for as long as you are in good health. Consult with your health care provider.  Pap test.** / Every 3 years starting at age 58 years through age 22 or 39 years with 3 consecutive normal Pap tests. Testing can be stopped between 65 and 70 years with 3 consecutive normal Pap tests and no abnormal Pap or HPV tests in the past 10 years.  HPV screening.** / Every 3 years from ages 64 years through ages 70 or 61 years with a history of 3 consecutive normal Pap tests. Testing can be stopped between 65 and 70 years with 3 consecutive normal Pap tests and no abnormal Pap or HPV tests in the past 10 years.  Fecal occult blood test (FOBT) of stool. / Every year beginning at age 40 years and continuing until age 27 years. You may not need to do this test if you get a colonoscopy every 10 years.  Flexible sigmoidoscopy or colonoscopy.** / Every 5 years for a flexible sigmoidoscopy or every 10 years for a colonoscopy beginning at age 7 years and continuing until age 32 years.  Hepatitis C blood test.** / For all people born from [birth year] through 1966 and any individual with known risks for hepatitis C.  Osteoporosis screening.** / A one-time screening for women ages 30 years and over and women at risk for fractures or osteoporosis.  Skin self-exam. / Monthly.  Influenza vaccine. / Every year.  Tetanus, diphtheria, and acellular pertussis (Tdap/Td)  vaccine.** / 1 dose of Td every 10 years.  Varicella vaccine.** / Consult your health care provider.  Zoster vaccine.** / 1 dose for adults aged 35 years or older.  Pneumococcal 13-valent conjugate (PCV13) vaccine.** / Consult your health care provider.  Pneumococcal polysaccharide (PPSV23) vaccine.** / 1 dose for all adults aged 46 years and older.  Meningococcal vaccine.** / Consult your health care provider.  Hepatitis A vaccine.** / Consult your health care provider.  Hepatitis B vaccine.** / Consult your health care provider.  Haemophilus influenzae type b (Hib) vaccine.** / Consult your health care provider. ** Family history and personal history of risk and conditions may change your health care provider's recommendations.   This information is not intended to replace advice given to you by your health care provider. Make sure you discuss any questions you have with your health care provider.   Document Released: 01/12/2002 Document Revised: 12/07/2014 Document Reviewed: 04/13/2011 Elsevier Interactive Patient Education [employer].

## 2016-01-14 NOTE — Progress Notes (Signed)
Pre visit review using our clinic review tool, if applicable. No additional management support is needed unless otherwise documented below in the visit note. 

## 2016-01-20 ENCOUNTER — Encounter (HOSPITAL_COMMUNITY): Payer: Self-pay

## 2016-01-20 ENCOUNTER — Encounter (HOSPITAL_COMMUNITY)
Admission: RE | Admit: 2016-01-20 | Discharge: 2016-01-20 | Disposition: A | Discharge status: Home / Self Care | Payer: Managed Care, Other (non HMO) | Source: Ambulatory Visit | Attending: Obstetrics and Gynecology | Admitting: Obstetrics and Gynecology

## 2016-01-20 DIAGNOSIS — N891 Moderate vaginal dysplasia: Secondary | ICD-10-CM | POA: Diagnosis present

## 2016-01-20 DIAGNOSIS — A630 Anogenital (venereal) warts: Secondary | ICD-10-CM | POA: Diagnosis not present

## 2016-01-20 DIAGNOSIS — I350 Nonrheumatic aortic (valve) stenosis: Secondary | ICD-10-CM | POA: Diagnosis not present

## 2016-01-20 DIAGNOSIS — I10 Essential (primary) hypertension: Secondary | ICD-10-CM | POA: Diagnosis not present

## 2016-01-20 DIAGNOSIS — F17210 Nicotine dependence, cigarettes, uncomplicated: Secondary | ICD-10-CM | POA: Diagnosis not present

## 2016-01-20 DIAGNOSIS — Z90710 Acquired absence of both cervix and uterus: Secondary | ICD-10-CM | POA: Diagnosis not present

## 2016-01-20 HISTORY — DX: Anxiety disorder, unspecified: F41.9

## 2016-01-20 HISTORY — DX: Hyperlipidemia, unspecified: E78.5

## 2016-01-20 HISTORY — DX: Other muscle spasm: M62.838

## 2016-01-20 LAB — CBC
HCT: 42.7 % (ref 36.0–46.0)
Hemoglobin: 14.5 g/dL (ref 12.0–15.0)
MCH: 31 pg (ref 26.0–34.0)
MCHC: 34 g/dL (ref 30.0–36.0)
MCV: 91.4 fL (ref 78.0–100.0)
PLATELETS: 150 10*3/uL (ref 150–400)
RBC: 4.67 MIL/uL (ref 3.87–5.11)
RDW: 13.4 % (ref 11.5–15.5)
WBC: 5.4 10*3/uL (ref 4.0–10.5)

## 2016-01-20 LAB — BASIC METABOLIC PANEL
Anion gap: 7 (ref 5–15)
BUN: 17 mg/dL (ref 6–20)
CHLORIDE: 102 mmol/L (ref 101–111)
CO2: 28 mmol/L (ref 22–32)
CREATININE: 0.88 mg/dL (ref 0.44–1.00)
Calcium: 9.3 mg/dL (ref 8.9–10.3)
GFR calc Af Amer: >60 mL/min (ref >60)
GFR calc non Af Amer: >60 mL/min (ref >60)
GLUCOSE: 87 mg/dL (ref 65–99)
Potassium: 4.2 mmol/L (ref 3.5–5.1)
SODIUM: 137 mmol/L (ref 135–145)

## 2016-01-20 NOTE — H&P (Signed)
Danielle Cobbs, MD at 01/09/2016 2:57 PM     Status: Signed       Expand All Collapse All   Patient ID: Danielle Obrien, female DOB: 07-27-1981, 35 y.o. MRN: [id number] GYNECOLOGY VISIT  HPI: 35 y.o. Single Caucasian female  [id number] with No LMP recorded. Patient has had a hysterectomy.  here for Surgical Consult.   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  Saw cardiology for clearance for surgery due to aortic stenosis.   Patient states she has a lot of anxiety about procedures.  Can take Vicodin and Percocet.  No change in health other than some rash on her chest and under the left breast.  Does not have a dermatologist.   GYNECOLOGIC HISTORY: No LMP recorded. Patient has had a hysterectomy. Contraception:Hysterectomy Menopausal hormone therapy: n/a Last mammogram: n/a Last pap smear: 09-18-15 LSIL:Pos HR HPV;colpo/vulvar bx 10-11-15--VAIN II and condyloma of vulva   OB History    Gravida Para Term Preterm AB TAB SAB Ectopic Multiple Living   1    1     0       Patient Active Problem List   Diagnosis Date Noted  . Tobacco abuse 09/27/2015  . Sinusitis, acute 08/20/2015  . Nonallopathic lesion of sacral region 04/10/2015  . SI (sacroiliac) joint dysfunction 02/20/2015  . Leg length discrepancy 02/20/2015  . Hip pain 01/07/2015  . Allergic rhinitis, cause unspecified 05/04/2014  . Aortic stenosis, moderate 02/20/2014  . Bipolar disorder ([place]) 02/05/2014  . Bicuspid aortic valve 01/27/2013  . Migraine with status migrainosus 01/27/2013  . Dyslipidemia (high LDL; low HDL) 01/27/2013  . Routine general medical examination at a health care facility 10/20/2011  . Mild persistent asthma 04/21/2011  . Essential hypertension, benign 04/21/2011    Past Medical History  Diagnosis Date  . Allergy   . Arthritis     In hips   . Asthma   . Hypertension   . Bicuspid aortic valve     moderate AS by echo 08/2015 with mean AVG 56mmHg and AVA 1.1cm2  . Anemia   . Alcohol abuse, in remission 2012  . Abnormal Pap smear of cervix     --recurrent ascus w/Pos. HR HPV  . Bipolar disorder ([place])   . ADD (attention deficit disorder)   . Headache   . Tobacco abuse 09/27/2015    Past Surgical History  Procedure Laterality Date  . Hip surgery  1981    Hip Reset   . Tympanostomy tube placement  1981/1982  . Hernia repair  1981/1982  . Tonsillectomy and adenoidectomy  1990  . Hip surgery  1990    Plate was reconstructed/ took out growth plate in Right knee  . Hip surgery  1992    Plate removed in Left hip  . Anterior cruciate ligament repair  1993  . Lymph node biopsy  1995  . Nasal septum surgery  2002  . Abdominal hysterectomy  02/06/2009    Robotic total laparoscopic hysterectomy  . 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 Colposcopy  10/2008    CIN I & II  . Colposcopy  07/2000    Neg. ECC  . Colposcopy  06/2001    CIN I  . Colposcopy  08/2004    ECC--atypia  . Cardiac catheterization  June 2015    no CAD - moderate AS noted  . Left and right heart catheterization  with coronary angiogram N/A 05/18/2014    Procedure: LEFT AND RIGHT HEART CATHETERIZATION WITH CORONARY ANGIOGRAM; Surgeon: Jettie Booze, MD; Location: [facility] CATH LAB; Service: Cardiovascular; Laterality: N/A;    Current Outpatient Prescriptions  Medication Sig Dispense Refill  . CALCIUM PO Take 2 tablets by mouth daily.    . Fluticasone-Salmeterol (ADVAIR) 250-50 MCG/DOSE AEPB Inhale 1 puff into the lungs 2 (two) times daily as needed (shortness of breath).    . gabapentin (NEURONTIN) 100 MG capsule Take 2 capsules (200 mg total) by  mouth at bedtime. 60 capsule 3  . lamoTRIgine (LAMICTAL) 200 MG tablet Take 1 tablet (200 mg total) by mouth 2 (two) times daily. (Patient taking differently: Take 400 mg by mouth daily. ) 60 tablet 0  . Lansoprazole (PREVACID PO) Take 1 tablet by mouth daily as needed (indigestion).    Marland Kitchen losartan (COZAAR) 50 MG tablet TAKE 1 TABLET BY MOUTH DAILY.---NEED APPOINTMENT FOR ADDITIONAL REFILLS 30 tablet 5  . Multiple Vitamin (MULTIVITAMIN WITH MINERALS) TABS tablet Take 1 tablet by mouth daily.    . SUMAtriptan (IMITREX) 100 MG tablet Take 100 mg by mouth daily as needed for migraine. May repeat in 2 hours if headache persists or recurs.    . traMADol (ULTRAM) 50 MG tablet Take 1 tablet (50 mg total) by mouth every 8 (eight) hours as needed for moderate pain or severe pain. 90 tablet 0   No current facility-administered medications for this visit.     ALLERGIES: Demerol; Morphine and related; and Codeine  Family History  Problem Relation Age of Onset  . Cancer Neg Hx   . Hyperlipidemia Mother   . Hypertension Mother   . Thyroid disease Mother   . Hyperlipidemia Father   . Hypertension Father   . Migraines Father   . Hypertension Brother   . Hyperlipidemia Brother   . Thyroid disease Maternal Grandmother   . Thyroid disease Maternal Grandfather     Social History   Social History  . Marital Status: Single    Spouse Name: N/A  . Number of Children: N/A  . Years of Education: N/A   Occupational History  . Not on file.   Social History Main Topics  . Smoking status: Current Every Day Smoker -- 0.25 packs/day for 12 years    Types: Cigarettes  . Smokeless tobacco: Never Used  . Alcohol Use: No  . Drug Use: No  . Sexual Activity:    Partners: Male    Birth Control/ Protection: Surgical     Comment: Hysterectomy   Other Topics  Concern  . Not on file   Social History Narrative    ROS: Pertinent items are noted in HPI.  PHYSICAL EXAMINATION:   BP 112/64 mmHg  Pulse 70  Ht 5' (1.524 m)  Wt 112 lb 3.2 oz (50.894 kg)  BMI 21.91 kg/m2  General appearance: alert, cooperative and appears stated age Head: Normocephalic, without obvious abnormality, atraumatic Neck: no adenopathy, supple, symmetrical, trachea midline and thyroid normal to inspection and palpation Lungs: clear to auscultation bilaterally Chest: midchest and under left breast has circular tan-pink 0.1 - 1 cm skin lesions. Ring worm? Heart: regular rate and rhythm Abdomen: soft, non-tender; bowel sounds normal; no masses, no organomegaly Extremities: extremities normal, atraumatic, no cyanosis or edema Skin: Skin color, texture, turgor normal. No rashes or lesions Lymph nodes: Cervical, supraclavicular, and axillary nodes normal. No abnormal inguinal nodes palpated Neurologic: Grossly normal  Pelvic: External genitalia: Mon pubis with large 1.5 cm  multpigmented slightly raised area. Vulva with multiple scattered condyloma.   Urethra: normal appearing urethra with no masses, tenderness or lesions  Bartholins and Skenes: normal   Vagina: normal appearing vagina with normal color and discharge, no lesions visible. Irregularity of the mucosa palpable on anterior vaginal wall.   Cervix: absent.    Bimanual Exam: Uterus: uterus absent  Adnexa: normal adnexa and no mass, fullness, tenderness   Chaperone was present for exam.  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  Condyloma.  Aortic stenosis.  Smoker.  New skin lesions. Possible ring worm?  PLAN  Plan for colposcopy with CO2 laser to vulvar condyloma and VAIN II and possible additional vulvar/vaginal biopsies. Risks, benefits and alternatives have been reviewed  with the patient who wishes to proceed. Risks include but are not limited to bleeding, infection, damage to surrounding organs, reaction to anesthesia, DVT, PE, death, recurrence of condyloma/vaginal dysplasia, and inability to remove the HPV. Surgical recovery and expectations discussed with the patient. She has concerns about returning too soon to work due to the very physical nature of her job.  She will call the hospital to schedule her preop visit.  Patient understands that smoking cessation will help her to control her the HPV. Referral to dermatology.  An After Visit Summary was printed and given to the patient.  ___15___ minutes face to face time of which over 50% was spent in counseling.

## 2016-01-20 NOTE — Patient Instructions (Addendum)
Your procedure is scheduled on:01/21/16  Enter through the Main Entrance at :10 am Pick up desk phone and dial [phone] and inform us of your arrival.  Please call [phone] if you have any problems the morning of surgery.  Remember: Do not eat food or drink liquids, including water, after midnight:tonight Clear liquids are ok until: 7am on Tuesday 01/21/16   You may brush your teeth the morning of surgery.  Take these meds the morning of surgery with a sip of water:Lamictal, Losartan, Prevacid  DO NOT wear jewelry, eye make-up, lipstick,body lotion, or dark fingernail polish.  (Polished toes are ok) You may wear deodorant.  If you are to be admitted after surgery, leave suitcase in car until your room has been assigned. Patients discharged on the day of surgery will not be allowed to drive home. Wear loose fitting, comfortable clothes for your ride home.

## 2016-01-21 ENCOUNTER — Ambulatory Visit (HOSPITAL_COMMUNITY): Payer: Managed Care, Other (non HMO) | Admitting: Anesthesiology

## 2016-01-21 ENCOUNTER — Encounter (HOSPITAL_COMMUNITY)
Admission: RE | Disposition: A | Discharge status: Home / Self Care | Payer: Self-pay | Source: Ambulatory Visit | Attending: Obstetrics and Gynecology

## 2016-01-21 ENCOUNTER — Encounter (HOSPITAL_COMMUNITY): Payer: Self-pay

## 2016-01-21 ENCOUNTER — Ambulatory Visit (HOSPITAL_COMMUNITY)
Admission: RE | Admit: 2016-01-21 | Discharge: 2016-01-21 | Disposition: A | Discharge status: Home / Self Care | Payer: Managed Care, Other (non HMO) | Source: Ambulatory Visit | Attending: Obstetrics and Gynecology | Admitting: Obstetrics and Gynecology

## 2016-01-21 DIAGNOSIS — I350 Nonrheumatic aortic (valve) stenosis: Secondary | ICD-10-CM | POA: Insufficient documentation

## 2016-01-21 DIAGNOSIS — Z87411 Personal history of vaginal dysplasia: Secondary | ICD-10-CM

## 2016-01-21 DIAGNOSIS — N891 Moderate vaginal dysplasia: Secondary | ICD-10-CM | POA: Diagnosis not present

## 2016-01-21 DIAGNOSIS — F17210 Nicotine dependence, cigarettes, uncomplicated: Secondary | ICD-10-CM | POA: Insufficient documentation

## 2016-01-21 DIAGNOSIS — A630 Anogenital (venereal) warts: Secondary | ICD-10-CM | POA: Diagnosis not present

## 2016-01-21 DIAGNOSIS — Z90710 Acquired absence of both cervix and uterus: Secondary | ICD-10-CM | POA: Insufficient documentation

## 2016-01-21 DIAGNOSIS — I10 Essential (primary) hypertension: Secondary | ICD-10-CM | POA: Insufficient documentation

## 2016-01-21 HISTORY — PX: COLPOSCOPY: SHX161

## 2016-01-21 HISTORY — DX: Personal history of vaginal dysplasia: Z87.411

## 2016-01-21 HISTORY — DX: Moderate vaginal dysplasia: N89.1

## 2016-01-21 SURGERY — COLPOSCOPY
Anesthesia: General | Site: Vagina

## 2016-01-21 MED ORDER — FERRIC SUBSULFATE 259 MG/GM EX SOLN
CUTANEOUS | Status: AC
  Filled 2016-01-21: qty 8

## 2016-01-21 MED ORDER — PROPOFOL 10 MG/ML IV BOLUS
INTRAVENOUS | Status: DC | PRN
  Administered 2016-01-21: 150 mg via INTRAVENOUS
  Administered 2016-01-21 (×2): 50 mg via INTRAVENOUS

## 2016-01-21 MED ORDER — FENTANYL CITRATE (PF) 100 MCG/2ML IJ SOLN
INTRAMUSCULAR | Status: AC
  Filled 2016-01-21: qty 2

## 2016-01-21 MED ORDER — LIDOCAINE HCL 1 % IJ SOLN
INTRAMUSCULAR | Status: AC
  Filled 2016-01-21: qty 20

## 2016-01-21 MED ORDER — FENTANYL CITRATE (PF) 100 MCG/2ML IJ SOLN
INTRAMUSCULAR | Status: DC | PRN
  Administered 2016-01-21: 50 ug via INTRAVENOUS
  Administered 2016-01-21: 100 ug via INTRAVENOUS
  Administered 2016-01-21: 50 ug via INTRAVENOUS

## 2016-01-21 MED ORDER — OXYCODONE-ACETAMINOPHEN 5-325 MG PO TABS: 1.0000 | ORAL_TABLET | ORAL | Status: DC | PRN

## 2016-01-21 MED ORDER — MIDAZOLAM HCL 2 MG/2ML IJ SOLN
INTRAMUSCULAR | Status: DC | PRN
  Administered 2016-01-21: 2 mg via INTRAVENOUS

## 2016-01-21 MED ORDER — DEXAMETHASONE SODIUM PHOSPHATE 10 MG/ML IJ SOLN
INTRAMUSCULAR | Status: DC | PRN
  Administered 2016-01-21: 4 mg via INTRAVENOUS

## 2016-01-21 MED ORDER — SCOPOLAMINE 1 MG/3DAYS TD PT72: 1.0000 | MEDICATED_PATCH | Freq: Once | TRANSDERMAL | Status: DC

## 2016-01-21 MED ORDER — OXYCODONE-ACETAMINOPHEN 5-325 MG PO TABS
1.0000 | ORAL_TABLET | ORAL | Status: DC | PRN
  Administered 2016-01-21: 1 via ORAL

## 2016-01-21 MED ORDER — CEFAZOLIN SODIUM-DEXTROSE 2-3 GM-% IV SOLR
INTRAVENOUS | Status: AC
  Filled 2016-01-21: qty 50

## 2016-01-21 MED ORDER — HYDROMORPHONE HCL 1 MG/ML IJ SOLN
INTRAMUSCULAR | Status: AC
  Administered 2016-01-21: 0.5 mg via INTRAVENOUS
  Filled 2016-01-21: qty 1

## 2016-01-21 MED ORDER — SILVER SULFADIAZINE 1 % EX CREA
TOPICAL_CREAM | CUTANEOUS | Status: AC
  Filled 2016-01-21: qty 85

## 2016-01-21 MED ORDER — HYDROMORPHONE HCL 1 MG/ML IJ SOLN
0.2500 mg | INTRAMUSCULAR | Status: DC | PRN
  Administered 2016-01-21 (×2): 0.5 mg via INTRAVENOUS

## 2016-01-21 MED ORDER — LIDOCAINE HCL (CARDIAC) 20 MG/ML IV SOLN
INTRAVENOUS | Status: DC | PRN
  Administered 2016-01-21: 60 mg via INTRAVENOUS

## 2016-01-21 MED ORDER — PHENYLEPHRINE 40 MCG/ML (10ML) SYRINGE FOR IV PUSH (FOR BLOOD PRESSURE SUPPORT)
PREFILLED_SYRINGE | INTRAVENOUS | Status: AC
  Filled 2016-01-21: qty 10

## 2016-01-21 MED ORDER — PROMETHAZINE HCL 25 MG/ML IJ SOLN: 6.2500 mg | INTRAMUSCULAR | Status: DC | PRN

## 2016-01-21 MED ORDER — PHENYLEPHRINE HCL 10 MG/ML IJ SOLN
INTRAMUSCULAR | Status: DC | PRN
  Administered 2016-01-21: 80 ug via INTRAVENOUS
  Administered 2016-01-21 (×2): 40 ug via INTRAVENOUS
  Administered 2016-01-21 (×9): 80 ug via INTRAVENOUS
  Administered 2016-01-21: 40 ug via INTRAVENOUS
  Administered 2016-01-21: 80 ug via INTRAVENOUS

## 2016-01-21 MED ORDER — ONDANSETRON HCL 4 MG/2ML IJ SOLN
INTRAMUSCULAR | Status: DC | PRN
  Administered 2016-01-21: 4 mg via INTRAVENOUS

## 2016-01-21 MED ORDER — PHENYLEPHRINE 40 MCG/ML (10ML) SYRINGE FOR IV PUSH (FOR BLOOD PRESSURE SUPPORT)
PREFILLED_SYRINGE | INTRAVENOUS | Status: AC
  Filled 2016-01-21: qty 20

## 2016-01-21 MED ORDER — SCOPOLAMINE 1 MG/3DAYS TD PT72
MEDICATED_PATCH | TRANSDERMAL | Status: AC
  Filled 2016-01-21: qty 1

## 2016-01-21 MED ORDER — SILVER SULFADIAZINE 1 % EX CREA: 1.0000 "application " | TOPICAL_CREAM | Freq: Every day | CUTANEOUS | Status: DC

## 2016-01-21 MED ORDER — LACTATED RINGERS IV SOLN: INTRAVENOUS | Status: DC

## 2016-01-21 MED ORDER — MIDAZOLAM HCL 2 MG/2ML IJ SOLN
INTRAMUSCULAR | Status: AC
  Filled 2016-01-21: qty 2

## 2016-01-21 MED ORDER — CEFAZOLIN SODIUM-DEXTROSE 2-3 GM-% IV SOLR
2.0000 g | INTRAVENOUS | Status: AC
  Administered 2016-01-21: 2 g via INTRAVENOUS

## 2016-01-21 MED ORDER — PHENYLEPHRINE HCL 10 MG/ML IJ SOLN
INTRAMUSCULAR | Status: AC
  Filled 2016-01-21: qty 1

## 2016-01-21 MED ORDER — SILVER SULFADIAZINE 1 % EX CREA
TOPICAL_CREAM | CUTANEOUS | Status: DC | PRN
  Administered 2016-01-21: 1 via TOPICAL

## 2016-01-21 MED ORDER — IODINE STRONG (LUGOLS) 5 % PO SOLN
ORAL | Status: AC
  Filled 2016-01-21: qty 1

## 2016-01-21 MED ORDER — ACETIC ACID 5 % SOLN
Status: AC
  Filled 2016-01-21: qty 500

## 2016-01-21 MED ORDER — PROPOFOL 10 MG/ML IV BOLUS
INTRAVENOUS | Status: AC
  Filled 2016-01-21: qty 20

## 2016-01-21 MED ORDER — BUPIVACAINE HCL (PF) 0.25 % IJ SOLN
INTRAMUSCULAR | Status: AC
  Filled 2016-01-21: qty 30

## 2016-01-21 MED ORDER — OXYCODONE-ACETAMINOPHEN 5-325 MG PO TABS
ORAL_TABLET | ORAL | Status: AC
  Filled 2016-01-21: qty 1

## 2016-01-21 MED ORDER — LACTATED RINGERS IV SOLN
INTRAVENOUS | Status: DC
  Administered 2016-01-21 (×3): via INTRAVENOUS

## 2016-01-21 MED ORDER — ACETIC ACID 4% SOLUTION
Status: DC | PRN
  Administered 2016-01-21: 1 via TOPICAL

## 2016-01-21 SURGICAL SUPPLY — 24 items
APPLICATOR COTTON TIP 6IN STRL (MISCELLANEOUS) ×4 IMPLANT
CLOTH BEACON ORANGE TIMEOUT ST (SAFETY) ×4 IMPLANT
CONTAINER PREFILL 10% NBF 15ML (MISCELLANEOUS) ×2 IMPLANT
CONTAINER PREFILL 10% NBF 60ML (FORM) IMPLANT
DEPRESSOR TONGUE BLADE WOOD (MISCELLANEOUS) ×4 IMPLANT
GAUZE SPONGE 4X4 16PLY XRAY LF (GAUZE/BANDAGES/DRESSINGS) IMPLANT
GLOVE BIO SURGEON STRL SZ 6.5 (GLOVE) ×3 IMPLANT
GLOVE BIO SURGEONS STRL SZ 6.5 (GLOVE) ×1
GLOVE BIOGEL PI IND STRL 7.0 (GLOVE) ×2 IMPLANT
GLOVE BIOGEL PI INDICATOR 7.0 (GLOVE) ×2
GOWN STRL REUS W/TWL LRG LVL3 (GOWN DISPOSABLE) ×5 IMPLANT
HOSE NS SMOKE EVAC 7/8 X6 (MISCELLANEOUS) ×3 IMPLANT
HOSE NS SMOKE EVAC 7/8 X6' (MISCELLANEOUS) ×1
NS IRRIG 1000ML POUR BTL (IV SOLUTION) ×4 IMPLANT
PACK VAGINAL MINOR WOMEN LF (CUSTOM PROCEDURE TRAY) ×4 IMPLANT
PAD OB MATERNITY 4.3X12.25 (PERSONAL CARE ITEMS) ×4 IMPLANT
REDUCER FITTING SMOKE EVAC (MISCELLANEOUS) ×4 IMPLANT
SCOPETTES 8  STERILE (MISCELLANEOUS) ×4
SCOPETTES 8 STERILE (MISCELLANEOUS) ×4 IMPLANT
SUT VIC AB 2-0 SH 27 (SUTURE) ×4
SUT VIC AB 2-0 SH 27XBRD (SUTURE) ×1 IMPLANT
TOWEL OR 17X24 6PK STRL BLUE (TOWEL DISPOSABLE) ×8 IMPLANT
TUBING SMOKE EVAC HOSE ADAPTER (MISCELLANEOUS) ×4 IMPLANT
WATER STERILE IRR 1000ML POUR (IV SOLUTION) ×4 IMPLANT

## 2016-01-21 NOTE — Progress Notes (Signed)
Update Note  No marked change in status since office preop visit.   Patient examined.   Ok to proceed with surgery.

## 2016-01-21 NOTE — Op Note (Signed)
Danielle Obrien, Danielle Obrien              ACCOUNT NO.:  [id number]  MEDICAL RECORD NO.:  [id number]  LOCATION:  [facility]                          FACILITY:  [facility]  PHYSICIAN:  Lenard Galloway, M.D.   DATE OF BIRTH:  1981/02/22  DATE OF PROCEDURE:  01/21/2016 DATE OF DISCHARGE:  01/21/2016                              OPERATIVE REPORT   PREOPERATIVE DIAGNOSIS:  VAIN II, vulvar condyloma.  POSTOPERATIVE DIAGNOSIS:  VAIN II, vulvar condyloma.  PROCEDURE:  Colposcopy of the vagina with vaginal biopsies, CO2 laser ablation of vaginal dysplasia and vulvar condyloma.  SURGEON:  Lenard Galloway, M.D.  ASSISTANT:  None.  ANESTHESIA:  General endotracheal.  IV FLUIDS FOR THE PROCEDURE:  1000 mL Ringer's lactate.  EBL:  5 mL.  URINE OUTPUT:  75 mL.  COMPLICATIONS:  None.  INDICATIONS FOR THE PROCEDURE:  The patient is a 35 year old, gravida 1, para 0-0-1-0, Caucasian female, status post robotic hysterectomy in 2010 for CIN 3, who presents with multiple external condyloma and an abnormal Pap smear on September 18, 2015, showing low-grade dysplasia.  The patient subsequently did have a colposcopy procedure which confirmed the presence of VAIN II.  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  Vulvar biopsies at the time of colposcopy confirmed the presence of vulvar condyloma.  Plan is now made to proceed with colposcopy with expected vaginal biopsies and CO2 laser ablation of vaginal dysplasia and vulvar condyloma.  Risks, benefits, and alternatives have been reviewed with the patient who wishes to proceed.  FINDINGS:  A colposcopy of the vagina demonstrated thickened acetowhite epithelium of the left vaginal apex cuff at the 11 o'clock position. There was also acetowhite change of the vaginal cuff at the 9 o'clock position.  Biopsies were taken of the 11 o'clock and 9 o'clock position. There was also thickened vaginal mucosa at the 6 o'clock position on the vaginal cuff region.  The vulva had  multiple raised and thickened brown cauliflower-like lesions of the vulva.  There was a large 2 cm light brown colored area of the right mons pubis that had some raised areas within it.  The raised areas were previously biopsied and noted to be condyloma.  The patient's cervix was absent.  No vaginal or vulvar masses were appreciated.  SPECIMENS:  Biopsies of the vaginal cuff at the 11 o'clock and the 9 o'clock position were sent to Pathology separately.  DESCRIPTION OF PROCEDURE:  The patient was reidentified in the preoperative hold area.  She did receive Ancef for antibiotic prophylaxis, and she received TED hose and PAS stockings for DVT prophylaxis.  The patient was escorted to the operating room, where she received general endotracheal anesthesia.  The patient was placed in the [place] prior to receiving the general anesthetic.  The patient's vulva was sterilely prepped.  The patient was sterilely draped including moistened towels which draped of the vulvar and vaginal areas.  The patient was catheterized of urine.  An insulated speculum was placed inside the vagina, and the vagina was bathed with acetic acid.  Colposcopy was performed and the findings are as noted above.  The anterior vaginal cuff at the 11 o'clock position demonstrated a raised lesion  which was sharply excised with a scalpel and sent to Pathology.  Another biopsy was performed of the vaginal cuff at the 9 o'clock position.  The CO2 laser had been tested with a setting of 10 watts.  The remaining raised areas of the vaginal cuff including the 6 o'clock position and the acetowhite area at the 9 o'clock position in addition to the remaining 11 o'clock position area were ablated using the CO2 laser with a setting of 10 watts.  The char was wiped away. There was some bleeding noted along the biopsy site at the 11 o'clock position and a figure-of-eight suture of 2-0 Vicryl was placed which created good  hemostasis.  The insulated speculum was removed at this time.    ttention was then turned to the vulvar region, where the numerous condyloma were treated again with the CO2 laser, the majority of which were treated with a setting of 10 watts.  Again, char was wiped away.  There were several of the condyloma which were actually quite thick and required more involved and treatment with the CO2 laser in order to remove them and wiped them away with a moistened Ray-Tec.  A small area of the left labia minora also demonstrated some acetowhite change, and this area was treated with the laser on a setting of 5 watts.  Again, the char was wiped away, and the area had been completely ablated and removed.  Hemostasis was good at termination of the procedure.  Silvadene was placed across all of the treatment areas.  A honeycomb dressing was placed over the right mons pubis area which had been ablated.  The patient was awakened and extubated and escorted to the recovery room in stable condition.  Silvadene was placed on all of the treated areas. There were no complications to the procedure.  All needle, instrument, and sponge counts were correct.     Lenard Galloway, M.D.     BES/MEDQ  D:  01/21/2016  T:  01/21/2016  Job:  [id number]

## 2016-01-21 NOTE — Anesthesia Procedure Notes (Signed)
Procedure Name: LMA Insertion Date/Time: 01/21/2016 11:50 AM Performed by: Jonna Munro Pre-anesthesia Checklist: Patient identified, Emergency Drugs available, Suction available and Patient being monitored Patient Re-evaluated:Patient Re-evaluated prior to inductionOxygen Delivery Method: Circle system utilized Preoxygenation: Pre-oxygenation with 100% oxygen Intubation Type: IV induction Ventilation: Mask ventilation without difficulty LMA: LMA inserted LMA Size: 3.0 Number of attempts: 1 Dental Injury: Teeth and Oropharynx as per pre-operative assessment

## 2016-01-21 NOTE — Brief Op Note (Signed)
01/21/2016  1:28 PM  PATIENT:  Danielle Obrien  35 y.o. female  PRE-OPERATIVE DIAGNOSIS:  VAIN II, vulvar condyloma.  POST-OPERATIVE DIAGNOSIS:  VAIN II, vulvar condyloma.  PROCEDURE:  Colposcopy with biopsies of the vagina, CO2 laser to vaginal dysplasia and vulvar condyloma. SURGEON:  Surgeon(s) and Role:    * Lachrisha Ziebarth E Yisroel Ramming, MD - Primary  PHYSICIAN ASSISTANT: NA  ASSISTANTS: none   ANESTHESIA:   general  EBL:  Total I/O In: 1000 [I.V.:1000] Out: 80 [Urine:75; Blood:5]  BLOOD ADMINISTERED:none  DRAINS: none   LOCAL MEDICATIONS USED:  NONE  SPECIMEN:  Source of Specimen:  vaginal cuff at 11:00 and 9:00.  DISPOSITION OF SPECIMEN:  PATHOLOGY  COUNTS:  YES  TOURNIQUET:  * No tourniquets in log *  DICTATION: .Other Dictation: Dictation Number    PLAN OF CARE: Discharge to home after PACU  PATIENT DISPOSITION:  PACU - hemodynamically stable.   Delay start of Pharmacological VTE agent (>24hrs) due to surgical blood loss or risk of bleeding: not applicable

## 2016-01-21 NOTE — Transfer of Care (Signed)
Immediate Anesthesia Transfer of Care Note  Patient: Danielle Obrien  Procedure(s) Performed: Procedure(s) with comments: COLPOSCOPY with vaginal biopsy with CO 2 Laser of Vaginal and vulvar condyloma (N/A) - [name] will be here 2/21 for 1115 case confirmed 01/16/15 - TS  Patient Location: PACU  Anesthesia Type:General  Level of Consciousness: awake, alert  and oriented  Airway & Oxygen Therapy: Patient Spontanous Breathing and Patient connected to nasal cannula oxygen  Post-op Assessment: Report given to RN and Post -op Vital signs reviewed and stable  Post vital signs: Reviewed and stable  Last Vitals:  Filed Vitals:   01/21/16 1017  Pulse: 90  Temp: 36.9 C  Resp: 14    Complications: No apparent anesthesia complications

## 2016-01-21 NOTE — Anesthesia Preprocedure Evaluation (Addendum)
Anesthesia Evaluation  Patient identified by MRN, date of birth, ID band Patient awake    Reviewed: Allergy & Precautions, NPO status , Patient's Chart, lab work & pertinent test results  History of Anesthesia Complications Negative for: history of anesthetic complications  Airway Mallampati: II  TM Distance: >3 FB Neck ROM: Full    Dental  (+) Dental Advisory Given, Teeth Intact   Pulmonary asthma , Current Smoker,    Pulmonary exam normal        Cardiovascular hypertension, Pt. on medications + Valvular Problems/Murmurs AS  Rhythm:Regular Rate:Normal + Systolic murmurs    Neuro/Psych  Headaches, PSYCHIATRIC DISORDERS Anxiety Bipolar Disorder    GI/Hepatic negative GI ROS, Neg liver ROS,   Endo/Other  negative endocrine ROS  Renal/GU negative Renal ROS     Musculoskeletal   Abdominal   Peds  Hematology   Anesthesia Other Findings   Reproductive/Obstetrics                            Anesthesia Physical Anesthesia Plan  ASA: III  Anesthesia Plan: General   Post-op Pain Management:    Induction: Intravenous  Airway Management Planned: LMA  Additional Equipment:   Intra-op Plan:   Post-operative Plan: Extubation in OR  Informed Consent: I have reviewed the patients History and Physical, chart, labs and discussed the procedure including the risks, benefits and alternatives for the proposed anesthesia with the patient or authorized representative who has indicated his/her understanding and acceptance.   Dental advisory given  Plan Discussed with: Anesthesiologist  Anesthesia Plan Comments:        Anesthesia Quick Evaluation

## 2016-01-21 NOTE — Anesthesia Postprocedure Evaluation (Signed)
Anesthesia Post Note  Patient: Danielle Obrien  Procedure(s) Performed: Procedure(s) (LRB): COLPOSCOPY with vaginal biopsy with CO 2 Laser of Vaginal and vulvar condyloma (N/A)  Patient location during evaluation: PACU Anesthesia Type: General Level of consciousness: sedated Pain management: pain level controlled Vital Signs Assessment: post-procedure vital signs reviewed and stable Respiratory status: spontaneous breathing and respiratory function stable Cardiovascular status: stable Anesthetic complications: no    Last Vitals:  Filed Vitals:   01/21/16 1400 01/21/16 1415  BP: 101/74 101/71  Pulse: 85 83  Temp:    Resp: 17 17    Last Pain:  Filed Vitals:   01/21/16 1418  PainSc: 7                  Emerald Gehres DANIEL

## 2016-01-21 NOTE — Discharge Instructions (Signed)
Danielle Obrien,   Your surgery today included the following: 1.  Colposcopy of the vagina.  2.  Two biopsies of the vagina.  One area required a dissolvable suture to close the biopsy site. 3.  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   The laser causes a burn effect on the tissue.  These areas will be sore and may develop weeping drainage and even minor bleeding.   The Silvadene cream and ice packs will help to give you comfort along with the Percocet medication.   Please call the office if you develop fever, heavy bleeding, pain uncontrolled by your medication, or expanding redness of the skin around the treatment areas.   I should have the biopsy reports for you at your post op visit.  Sometime, they are ready before then.   You did great today!  Josefa Half, MD  Colposcopy, Care After Refer to this sheet in the next few weeks. These instructions provide you with information on caring for yourself after your procedure. Your health care provider may also give you more specific instructions. Your treatment has been planned according to current medical practices, but problems sometimes occur. Call your health care provider if you have any problems or questions after your procedure. WHAT TO EXPECT AFTER THE PROCEDURE  After your procedure, it is typical to have the following:  Cramping. This often goes away in a few minutes.  Soreness. This may last for 2 days.  Lightheadedness. Lie down for a few minutes if this occurs. You may also have some bleeding or dark discharge for a few days. You may need to wear a sanitary pad during this time. HOME CARE INSTRUCTIONS  Avoid sex, douching, and using tampons for 3 days or as directed by your health care provider.  Only take over-the-counter or prescription medicines as directed by your health care provider. Do not take aspirin because it can cause bleeding.  Continue to take birth control pills if you are on  them.  Not all test results are available during your visit. If your test results are not back during the visit, make an appointment with your health care provider to find out the results. Do not assume everything is normal if you have not heard from your health care provider or the medical facility. It is important for you to follow up on all of your test results.  Follow your health care provider's advice regarding activity, follow-up visits, and follow-up Pap tests. SEEK MEDICAL CARE IF:  You develop a rash.  You have problems with your medicine. SEEK IMMEDIATE MEDICAL CARE IF:  You are bleeding heavily or are passing blood clots.  You have a fever.  You have abnormal vaginal discharge.  You are having cramps that do not go away after taking your pain medicine.  You feel lightheaded, dizzy, or faint.  You have stomach pain.   This information is not intended to replace advice given to you by your health care provider. Make sure you discuss any questions you have with your health care provider.   Document Released: 09/06/2013 Document Reviewed: 09/06/2013 Elsevier Interactive Patient Education [employer].

## 2016-01-22 ENCOUNTER — Telehealth: Payer: Self-pay | Admitting: Obstetrics and Gynecology

## 2016-01-22 ENCOUNTER — Encounter (HOSPITAL_COMMUNITY): Payer: Self-pay | Admitting: Obstetrics and Gynecology

## 2016-01-22 NOTE — Telephone Encounter (Signed)
I agree with your recommendations.  The Silvadene cream is once daily.  The cramping may last for a couple of days.  Take the Percocet or Tylenol if needed for cramping.  Of course, she can be seen if she is having any problems!

## 2016-01-22 NOTE — Telephone Encounter (Signed)
Spoke with patient. Advised of message as seen below from [place]. She is agreeable and verbalizes understanding. Patient is asking about when she can remove her bandage from her biopsy site and how often she needs to use cleansing spray she was provided. "I do not want to get it wet if I am not supposed to and I don't know how often to use the spray." Advised I will speak with Dr.Silva and return call with additional recommendations. She is agreeable.

## 2016-01-22 NOTE — Telephone Encounter (Signed)
The bandage can be removed now.  This was not a biopsy site, but it is the larger area where she had condyloma within the pigmented area.  I placed a bandage because it was oozing slightly.   She can rinse all vulvar areas daily with some liquid soap and water falling on the treated ares.  This rinsing can be done daily and use the Silvadene cream here as well.   I have her biopsy reports back from her vaginal biopsies.  They showed VAIN I and II. No cancer was seen!  No external areas were biopsied.  These areas were lasered.

## 2016-01-22 NOTE — Telephone Encounter (Signed)
Spoke with patient. Advised of message ans results as seen below from [place]. She is agreeable and verbalizes understanding.  Routing to provider for final review. Patient agreeable to disposition. Will close encounter.

## 2016-01-22 NOTE — Telephone Encounter (Signed)
Patient has questions after surgery.

## 2016-01-22 NOTE — Telephone Encounter (Signed)
Spoke with patient. Patient had a colposcopy with vaginal biopsy w/ CO2 laser of vaginal and vulvar condyloma on 01/21/2016. Patient states that she was reviewing her post-op information and is unsure how often she needs to use her "cream and bathroom spritz." Advised she will need to use the Silver Sulfadiazine 1% cream once per day. Advised I will speak with Dr.Silva regarding cleansing spray and return call with additional recommendations. She is agreeable. Patient is also asking how long she should expect to having cramping. Advised cramping can last up to 24 hours after the procedure, and should begin to subside. Denies any sharp pain, heavy bleeding, or fever. Advised if her cramping persists or worsens she will need to be seen in the office. Reports she has taken Percocet 1 tablet for pain with relief as she is unable to take Ibuprofen. Advised I will speak with Dr.Silva regarding medication and return call. She is agreeable.

## 2016-01-24 ENCOUNTER — Encounter: Payer: Self-pay | Admitting: Obstetrics and Gynecology

## 2016-01-24 ENCOUNTER — Telehealth: Payer: Self-pay | Admitting: Obstetrics and Gynecology

## 2016-01-24 ENCOUNTER — Ambulatory Visit (INDEPENDENT_AMBULATORY_CARE_PROVIDER_SITE_OTHER): Payer: Managed Care, Other (non HMO) | Admitting: Obstetrics and Gynecology

## 2016-01-24 VITALS — BP 118/64 | HR 100 | Temp 98.8°F | Ht 60.0 in | Wt 117.0 lb

## 2016-01-24 DIAGNOSIS — Z9889 Other specified postprocedural states: Secondary | ICD-10-CM

## 2016-01-24 MED ORDER — OXYCODONE HCL 5 MG PO CAPS: 5.0000 mg | ORAL_CAPSULE | ORAL | Status: DC | PRN

## 2016-01-24 NOTE — Progress Notes (Signed)
Patient ID: Danielle Obrien, female   DOB: 18-Jun-1981, 35 y.o.   MRN: [id number] GYNECOLOGY  VISIT   HPI: 35 y.o.   Single  Caucasian  female   [id number] with No LMP recorded. Patient has had a hysterectomy.   here for vulvar pain, status post COLPOSCOPY with vaginal biopsy with CO 2 Laser of Vaginal and vulvar condyloma (N/A Vagina ) 01-21-16.   Having vulvar burning.  Hurts to wipe. Using Silvadene cream.  Almost out of Percocet and concerned due to it being Friday.   Planned for 11 days out of work.   GYNECOLOGIC HISTORY: No LMP recorded. Patient has had a hysterectomy. Contraception:Hysterectomy Menopausal hormone therapy: none Last mammogram: n/a Last pap smear: 09-18-15 LSIL:Pos HR HPV;colpo/vulvar bx 10-11-15--VAIN II and condyloma of vulva        OB History    Gravida Para Term Preterm AB TAB SAB Ectopic Multiple Living   1    1     0         Patient Active Problem List   Diagnosis Date Noted  . Tobacco abuse 09/27/2015  . Nonallopathic lesion of sacral region 04/10/2015  . SI (sacroiliac) joint dysfunction 02/20/2015  . Leg length discrepancy 02/20/2015  . Hip pain 01/07/2015  . Allergic rhinitis, cause unspecified 05/04/2014  . Aortic stenosis, moderate 02/20/2014  . Bipolar disorder ([place]) 02/05/2014  . Bicuspid aortic valve 01/27/2013  . Migraine with status migrainosus 01/27/2013  . Hyperlipidemia with target LDL less than 130 01/27/2013  . Routine general medical examination at a health care facility 10/20/2011  . Mild persistent asthma 04/21/2011  . Essential hypertension, benign 04/21/2011    Past Medical History  Diagnosis Date  . Allergy   . Arthritis     In hips  . Hypertension   . Bicuspid aortic valve     moderate AS by echo 08/2015 with mean AVG 61mmHg and AVA 1.1cm2  . Alcohol abuse, in remission 2012  . Abnormal Pap smear of cervix     --recurrent ascus w/Pos. HR HPV  . Bipolar disorder ([place])   . ADD (attention deficit disorder)   .  Headache   . Tobacco abuse 09/27/2015  . Hyperlipidemia with target LDL less than 130 01/27/2013  . Anxiety   . Muscle spasms of both lower extremities     both hips  . Asthma     rare inhaler use  . Aortic stenosis   . 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 Hip surgery  1981    Hip Reset   . Tympanostomy tube placement  1981/1982  . Hernia repair  1981/1982  . Tonsillectomy and adenoidectomy  1990  . Hip surgery  1990    Plate was reconstructed/ took out growth plate in Right knee  . Hip surgery  1992    Plate removed in Left hip  . Anterior cruciate ligament repair  1993  . Lymph node biopsy  1995  . Nasal septum surgery  2002  . Abdominal hysterectomy  02/06/2009    Robotic total laparoscopic hysterectomy  . 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 Colposcopy  10/2008    CIN I & II  . Colposcopy  07/2000    Neg. ECC  . Colposcopy  06/2001    CIN I  .  Colposcopy  08/2004    ECC--atypia  . Cardiac catheterization  June 2015    no CAD - moderate AS noted  . Left and right heart catheterization with coronary angiogram N/A 05/18/2014    Procedure: LEFT AND RIGHT HEART CATHETERIZATION WITH CORONARY ANGIOGRAM;  Surgeon: Jettie Booze, MD;  Location: [facility] CATH LAB;  Service: Cardiovascular;  Laterality: N/A;  . Colposcopy N/A 01/21/2016    Procedure: COLPOSCOPY with vaginal biopsy with CO 2 Laser of Vaginal and vulvar condyloma;  Surgeon: [name], MD;  Location: [facility];  Service: Gynecology;  Laterality: N/A;  [name] will be here 2/21 for 1115 case confirmed 01/16/15 - TS    Current Outpatient Prescriptions  Medication Sig Dispense Refill  . CALCIUM PO Take 2 tablets by mouth daily.    . Fluticasone-Salmeterol (ADVAIR) 250-50 MCG/DOSE AEPB Inhale 1 puff into the lungs 2 (two) times daily as needed  (shortness of breath).    . gabapentin (NEURONTIN) 100 MG capsule Take 2 capsules (200 mg total) by mouth at bedtime. 60 capsule 3  . lamoTRIgine (LAMICTAL) 200 MG tablet Take 2 tablets (400 mg total) by mouth daily. 180 tablet 3  . Lansoprazole (PREVACID PO) Take 1 tablet by mouth daily as needed (indigestion).    Marland Kitchen losartan (COZAAR) 50 MG tablet TAKE 1 TABLET BY MOUTH DAILY.---NEED APPOINTMENT FOR ADDITIONAL REFILLS 30 tablet 5  . Multiple Vitamin (MULTIVITAMIN WITH MINERALS) TABS tablet Take 1 tablet by mouth daily.    Marland Kitchen oxyCODONE-acetaminophen (PERCOCET) 5-325 MG tablet Take 1-2 tablets by mouth every 4 (four) hours as needed. use only as much as needed to relieve pain 30 tablet 0  . silver sulfADIAZINE (SILVADENE) 1 % cream Apply 1 application topically daily. 50 g 0  . SUMAtriptan (IMITREX) 100 MG tablet Take 100 mg by mouth daily as needed for migraine. May repeat in 2 hours if headache persists or recurs.     No current facility-administered medications for this visit.     ALLERGIES: Demerol; Ibuprofen; Morphine and related; and Codeine  Family History  Problem Relation Age of Onset  . Cancer Neg Hx   . Hyperlipidemia Mother   . Hypertension Mother   . Thyroid disease Mother   . Hyperlipidemia Father   . Hypertension Father   . Migraines Father   . Hypertension Brother   . Hyperlipidemia Brother   . Thyroid disease Maternal Grandmother   . Thyroid disease Maternal Grandfather     Social History   Social History  . Marital Status: Single    Spouse Name: N/A  . Number of Children: N/A  . Years of Education: N/A   Occupational History  . Not on file.   Social History Main Topics  . Smoking status: Current Every Day Smoker -- 0.25 packs/day for 12 years    Types: Cigarettes  . Smokeless tobacco: Never Used  . Alcohol Use: No  . Drug Use: No  . Sexual Activity:    Partners: Male    Birth Control/ Protection: Surgical     Comment: Hysterectomy   Other Topics  Concern  . Not on file   Social History Narrative    ROS:  Pertinent items are noted in HPI.  PHYSICAL EXAMINATION:    BP 118/64 mmHg  Pulse 100  Temp(Src) 98.8 F (37.1 C) (Oral)  Ht 5' (1.524 m)  Wt 117 lb (53.071 kg)  BMI 22.85 kg/m2    General appearance: alert, cooperative and appears stated age  Pelvic: External genitalia:   Multiple ulcerated areas of the vulva consistent with extensive CO2 lazer ablation treatment.  No generalized erythema of the vulva.  No bleeding.               Urethra:  normal appearing urethra with no masses, tenderness or lesions               Chaperone was present for exam.  ASSESSMENT  Status post extensive CO2 laser ablation of external vulvar condyloma.  No evidence of cellulitis. Status post vaginal biopsies and CO2 laser of the vaginal cuff.   PLAN  Counseled regarding procedure.  Counseled regarding possible future hypopigmentation of the skin in the treatment areas and ?change in hair growth in these areas? Use Silvadene once daily.  (Has plenty.) Use peribottle with voiding.  Avoid pad use, and use cotton underwear.  Rx for Oxy IR 5 mg po q 4 hours prn.  #30, RF none.  Return in 6 days for a recheck.  May be ready to go to work on March 6 but not before.  Will work toward this goal.  Note written which clarifies this.  Patient appreciative of her care.   An After Visit Summary was printed and given to the patient.

## 2016-01-24 NOTE — Telephone Encounter (Signed)
Spoke with patient. Patient had a colposcopy with laser removal of condyloma on 01/21/2016. Patient states "I feel like I am on fire." States the pain is coming from the where condylomas were removed. Pain is radiating into her hip. Denies any heavy bleeding. She rates pain an 11 on a scale of 1-10. States she was originally taking 2 Percocet every 4 hours, but realized she was going to run out before her appointment on Monday with Dr.Silva. She is now taking 1/2 tablet every 2 hours. "I am trying to spread it out. I was doing better when I was taking 2 tablets every 4 hours. I am also using the cream she gave me twice a day." She has not been alternating her pain medication with Ibuprofen. Reports she is wearing a pad due to spotting and feels this may be contributing to her pain. Advised patient with the amount of pain she is in she will need to be seen in the office for further evaluation. Patient states that she would like to wait until Monday when her mother can come with her to her appointment. "I can hold off until Monday. I just need something for the pain so I wont run out of medicine." Advised I will speak with Dr.Silva and return call with further recommendations. She is agreeable.

## 2016-01-24 NOTE — Telephone Encounter (Signed)
Bring patient in now.  

## 2016-01-24 NOTE — Telephone Encounter (Signed)
Patient had surgery on Tuesday and is having a lot of pain. She states she has no refills on the Percocet. She has been using the cream a well but is scared she will run out of pain medication before her appointment on Monday.

## 2016-01-24 NOTE — Telephone Encounter (Signed)
Spoke with patient. Advised of message as seen below from . She will head to the office now and can be here in 30 minutes. Appointment scheduled for 3:20 pm today for work in. She is agreeable. She does have a driver.  Routing to provider for final review. Patient agreeable to disposition. Will close encounter.

## 2016-01-27 ENCOUNTER — Ambulatory Visit: Payer: Managed Care, Other (non HMO) | Admitting: Obstetrics and Gynecology

## 2016-01-29 ENCOUNTER — Telehealth: Payer: Self-pay | Admitting: Obstetrics and Gynecology

## 2016-01-29 NOTE — Telephone Encounter (Signed)
Reviewed office visit notes from Dr. Quincy Simmonds.  Patient referral to Dermatology for skin changes near breast on 01/09/16 possible fungal infection. Patient has had laser treatment to Vulvar in the interim time.  Per message patient would call back for appointment with Dr. Tonia Brooms.   Dr. Quincy Simmonds, Please advise how to proceed. Okay to close referral?

## 2016-01-29 NOTE — Telephone Encounter (Signed)
Ok to close referral at this time to Dr. Tonia Brooms.

## 2016-01-29 NOTE — Telephone Encounter (Signed)
Danielle Obrien can you close referral? Will close this encounter.

## 2016-01-29 NOTE — Telephone Encounter (Signed)
Received call from [place] with Dr Christean Leaf office. a referral had been placed for this patient to be evaluated by Dr Tonia Brooms. sarah advised after numerous attempts, they have reached the patient and she advised she is not in a position to schedule this appointment at this time and will call back when she can. Referring to triage to review

## 2016-01-30 ENCOUNTER — Telehealth: Payer: Self-pay | Admitting: Obstetrics and Gynecology

## 2016-01-30 ENCOUNTER — Ambulatory Visit (INDEPENDENT_AMBULATORY_CARE_PROVIDER_SITE_OTHER): Payer: Managed Care, Other (non HMO) | Admitting: Obstetrics and Gynecology

## 2016-01-30 ENCOUNTER — Encounter: Payer: Self-pay | Admitting: Obstetrics and Gynecology

## 2016-01-30 VITALS — BP 114/70 | HR 80 | Ht 60.0 in | Wt 116.4 lb

## 2016-01-30 DIAGNOSIS — Z9889 Other specified postprocedural states: Secondary | ICD-10-CM

## 2016-01-30 DIAGNOSIS — N891 Moderate vaginal dysplasia: Secondary | ICD-10-CM

## 2016-01-30 MED ORDER — OXYCODONE HCL 5 MG PO CAPS: 5.0000 mg | ORAL_CAPSULE | ORAL | Status: DC | PRN

## 2016-01-30 MED ORDER — SILVER SULFADIAZINE 1 % EX CREA
1.0000 | TOPICAL_CREAM | Freq: Every day | CUTANEOUS | Status: DC
Start: 2016-01-30 — End: 2016-03-23

## 2016-01-30 NOTE — Telephone Encounter (Signed)
Spoke with [name] at the [facility]. [name] states that the Oxycodone 5 mg is only carried in tablets at their location which she is able to fill at this time. "The tablets are extended release just like the capsules. They are the same just a different form." Advised it is okay to go ahead and fill as the tablets for the patient. She is agreeable and will fill this rx.

## 2016-01-30 NOTE — Telephone Encounter (Signed)
Thank you.  I agree with filling the oxycodone tablets.  I have not been prescribing Gabapentin.

## 2016-01-30 NOTE — Progress Notes (Signed)
Patient ID: Danielle Obrien, female   DOB: 24-Mar-1981, 35 y.o.   MRN: [id number] GYNECOLOGY  VISIT   HPI: 35 y.o.   Single  Caucasian  female   G1P0010 with No LMP recorded. Patient has had a hysterectomy.   here for 1 week follow up COLPOSCOPY with vaginal biopsy with CO 2 Laser of Vaginal and vulvar condyloma (N/A Vagina ).   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   Mother is present for the entire visit. She is hearing impaired.  She and the patient sign to each other during the visit.  States she is feeling better.  Right side is hurting less than the left side.  Ran out of the Oxy IR. Using the silvadene cream.  This is running out also.  Feels nausea when she is pushing herself too hard but does not think this is due to her pain medication.  GYNECOLOGIC HISTORY: No LMP recorded. Patient has had a hysterectomy. Contraception:Hysterectomy Menopausal hormone therapy: None Last mammogram: n/a Last pap smear: 09-18-15 LSIL:Pos HR HPV;colpo/vulvar bx 10-11-15--VAIN II and condyloma of vulva.        OB History    Gravida Para Term Preterm AB TAB SAB Ectopic Multiple Living   1    1     0         Patient Active Problem List   Diagnosis Date Noted  . Tobacco abuse 09/27/2015  . Nonallopathic lesion of sacral region 04/10/2015  . SI (sacroiliac) joint dysfunction 02/20/2015  . Leg length discrepancy 02/20/2015  . Hip pain 01/07/2015  . Allergic rhinitis, cause unspecified 05/04/2014  . Aortic stenosis, moderate 02/20/2014  . Bipolar disorder ([place]) 02/05/2014  . Bicuspid aortic valve 01/27/2013  . Migraine with status migrainosus 01/27/2013  . Hyperlipidemia with target LDL less than 130 01/27/2013  . Routine general medical examination at a health care facility 10/20/2011  . Mild persistent asthma 04/21/2011  . Essential hypertension, benign 04/21/2011    Past Medical History  Diagnosis Date  . Allergy   . Arthritis     In hips  . Hypertension    . Bicuspid aortic valve     moderate AS by echo 08/2015 with mean AVG 73mmHg and AVA 1.1cm2  . Alcohol abuse, in remission 2012  . Abnormal Pap smear of cervix     --recurrent ascus w/Pos. HR HPV  . Bipolar disorder ([place])   . ADD (attention deficit disorder)   . Headache   . Tobacco abuse 09/27/2015  . Hyperlipidemia with target LDL less than 130 01/27/2013  . Anxiety   . Muscle spasms of both lower extremities     both hips  . Asthma     rare inhaler use  . Aortic stenosis   . 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 Hip surgery  1981    Hip Reset   . Tympanostomy tube placement  1981/1982  . Hernia repair  1981/1982  . Tonsillectomy and adenoidectomy  1990  . Hip surgery  1990    Plate was reconstructed/ took out growth plate in Right knee  . Hip surgery  1992    Plate removed in Left hip  . Anterior cruciate ligament repair  1993  . Lymph node biopsy  1995  . Nasal septum surgery  2002  . Abdominal hysterectomy  02/06/2009  Robotic total laparoscopic hysterectomy  . 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 Colposcopy  10/2008    CIN I & II  . Colposcopy  07/2000    Neg. ECC  . Colposcopy  06/2001    CIN I  . Colposcopy  08/2004    ECC--atypia  . Cardiac catheterization  June 2015    no CAD - moderate AS noted  . Left and right heart catheterization with coronary angiogram N/A 05/18/2014    Procedure: LEFT AND RIGHT HEART CATHETERIZATION WITH CORONARY ANGIOGRAM;  Surgeon: Jettie Booze, MD;  Location: [facility] CATH LAB;  Service: Cardiovascular;  Laterality: N/A;  . Colposcopy N/A 01/21/2016    Procedure: COLPOSCOPY with vaginal biopsy with CO 2 Laser of Vaginal and vulvar condyloma;  Surgeon: [name], MD;  Location: [facility];  Service: Gynecology;  Laterality: N/A;  [name] will be here 2/21 for 1115  case confirmed 01/16/15 - TS    Current Outpatient Prescriptions  Medication Sig Dispense Refill  . CALCIUM PO Take 2 tablets by mouth daily.    . Fluticasone-Salmeterol (ADVAIR) 250-50 MCG/DOSE AEPB Inhale 1 puff into the lungs 2 (two) times daily as needed (shortness of breath).    . gabapentin (NEURONTIN) 100 MG capsule Take 2 capsules (200 mg total) by mouth at bedtime. 60 capsule 3  . lamoTRIgine (LAMICTAL) 200 MG tablet Take 2 tablets (400 mg total) by mouth daily. 180 tablet 3  . Lansoprazole (PREVACID PO) Take 1 tablet by mouth daily as needed (indigestion).    Marland Kitchen losartan (COZAAR) 50 MG tablet TAKE 1 TABLET BY MOUTH DAILY.---NEED APPOINTMENT FOR ADDITIONAL REFILLS 30 tablet 5  . Multiple Vitamin (MULTIVITAMIN WITH MINERALS) TABS tablet Take 1 tablet by mouth daily.    Marland Kitchen oxycodone (OXY-IR) 5 MG capsule Take 1 capsule (5 mg total) by mouth every 4 (four) hours as needed. 30 capsule 0  . silver sulfADIAZINE (SILVADENE) 1 % cream Apply 1 application topically daily. 50 g 0  . SUMAtriptan (IMITREX) 100 MG tablet Take 100 mg by mouth daily as needed for migraine. May repeat in 2 hours if headache persists or recurs.     No current facility-administered medications for this visit.     ALLERGIES: Demerol; Ibuprofen; Morphine and related; and Codeine  Family History  Problem Relation Age of Onset  . Cancer Neg Hx   . Hyperlipidemia Mother   . Hypertension Mother   . Thyroid disease Mother   . Hyperlipidemia Father   . Hypertension Father   . Migraines Father   . Hypertension Brother   . Hyperlipidemia Brother   . Thyroid disease Maternal Grandmother   . Thyroid disease Maternal Grandfather     Social History   Social History  . Marital Status: Single    Spouse Name: N/A  . Number of Children: N/A  . Years of Education: N/A   Occupational History  . Not on file.   Social History Main Topics  . Smoking status: Current Every Day Smoker -- 0.25 packs/day for 12 years     Types: Cigarettes  . Smokeless tobacco: Never Used  . Alcohol Use: No  . Drug Use: No  . Sexual Activity:    Partners: Male    Birth Control/ Protection: Surgical     Comment: Hysterectomy   Other Topics Concern  . Not on file   Social History Narrative    ROS:  Pertinent  items are noted in HPI.  PHYSICAL EXAMINATION:    BP 114/70 mmHg  Pulse 80  Ht 5' (1.524 m)  Wt 116 lb 6.4 oz (52.799 kg)  BMI 22.73 kg/m2    General appearance: alert, cooperative and appears stated age    Pelvic: External genitalia:  Multiple areas of treatment noted.  Eschars present on the mons and superior treatment area.  Lower vulva with areas demonstrating deeper burn effect from CO2 laser.  No eschars.  No erythema of skin.               Urethra:  normal appearing urethra with no masses, tenderness or lesions              Bartholins and Skenes: normal                 Vagina:  Suture present on anterior vaginal wall.  Mucosa healing well.               Cervix: absent   Chaperone was present for exam.  ASSESSMENT  Status post extensive CO2 laser of vulvar condyloma and vaginal biopsies with CO2 laser to vaginal mucosa. Tobacco use.   PLAN  Discussion regarding healing.  The lower areas look like the CO2 laser had deeper penetration.  I told the patient she could have loss of coloration of her vulvar skin and scarring due to the treatment.   She states she is not concerned about scarring and that she is appreciative of her care.  She hugs me at the end of her visit.  OxyIR and Silvadene cream Rxs. Discussed smoking cessation to reduce risk of recurrence of condyloma/HPV changes. Follow up in 1 week.  Excused from jury duty.  Hand written note written.  Return to work on 02/07/16.  Light duty for 2 weeks .  Hand note written.    An After Visit Summary was printed and given to the patient.  __25____ minutes face to face time of which over 50% was spent in counseling.

## 2016-01-30 NOTE — Telephone Encounter (Signed)
Danielle Obrien from [place] [facility] calling wanting Dr.  Quincy Simmonds to know that the Gabapentin capsules have been discontinued so they just dispense tablets which are the same as the capsules for the patient. [place] [phone]

## 2016-02-03 ENCOUNTER — Other Ambulatory Visit: Payer: Self-pay | Admitting: *Deleted

## 2016-02-03 MED ORDER — GABAPENTIN 100 MG PO CAPS: 200.0000 mg | ORAL_CAPSULE | Freq: Every day | ORAL | Status: DC

## 2016-02-03 NOTE — Telephone Encounter (Signed)
Refill done.  

## 2016-02-04 ENCOUNTER — Telehealth: Payer: Self-pay | Admitting: *Deleted

## 2016-02-04 ENCOUNTER — Telehealth: Payer: Self-pay | Admitting: Internal Medicine

## 2016-02-04 DIAGNOSIS — M533 Sacrococcygeal disorders, not elsewhere classified: Secondary | ICD-10-CM

## 2016-02-04 MED ORDER — TRAMADOL HCL 50 MG PO TABS: 50.0000 mg | ORAL_TABLET | Freq: Three times a day (TID) | ORAL | Status: DC | PRN

## 2016-02-04 NOTE — Telephone Encounter (Signed)
Pt is calling regarding her Tramadol. I do not see this on her med list. Please give her a call back

## 2016-02-04 NOTE — Telephone Encounter (Signed)
Per dr Tamala Julian, okay to refill tramadol 50mg . #90

## 2016-02-04 NOTE — Telephone Encounter (Signed)
Received call pt states pharmacy sent refill request to have her tramadol fill & they have not heard back from office. Pls advise on Tramadol refill...[name]

## 2016-02-04 NOTE — Telephone Encounter (Signed)
Check the RX written earlier today by ZS

## 2016-02-04 NOTE — Telephone Encounter (Signed)
Per Ria Comment rx was faxed to [facility] this am. [name] spoke with [name] verified if they receive script on Tramadol from Dr. Tamala Julian. She stated yes, and they are getting ready for pt...Danielle Obrien

## 2016-02-04 NOTE — Telephone Encounter (Signed)
Received call from [facility] checking on status on Tramadol refill. Pt has called them & [place] several x's concerning refill. . Is this ok to refill...Danielle Obrien

## 2016-02-06 ENCOUNTER — Ambulatory Visit (INDEPENDENT_AMBULATORY_CARE_PROVIDER_SITE_OTHER): Payer: Managed Care, Other (non HMO) | Admitting: Obstetrics and Gynecology

## 2016-02-06 ENCOUNTER — Encounter: Payer: Self-pay | Admitting: Obstetrics and Gynecology

## 2016-02-06 VITALS — BP 106/60 | HR 70 | Ht 60.0 in | Wt 112.2 lb

## 2016-02-06 DIAGNOSIS — Z9889 Other specified postprocedural states: Secondary | ICD-10-CM

## 2016-02-06 MED ORDER — OXYCODONE-ACETAMINOPHEN 5-325 MG PO TABS: 1.0000 | ORAL_TABLET | ORAL | Status: DC | PRN

## 2016-02-06 NOTE — Progress Notes (Signed)
Patient ID: Danielle Obrien, female   DOB: 07-06-1981, 35 y.o.   MRN: [id number] GYNECOLOGY  VISIT   HPI: 35 y.o.   Single  Caucasian  female   G1P0010 with No LMP recorded. Patient has had a hysterectomy.   here for 2 week follow up COLPOSCOPY with vaginal biopsy with CO 2 Laser of Vaginal and vulvar condyloma (N/A Vagina ).    Feels better with not wearing underwear.   Reducing the Silvadene cream usage.  Uses q day to bid.  Taking oxycodone 3 with showering as she is scrubbing the areas.  Also taking this in the am. Is holding off on Tramadol but did fill a prescription 3 days ago from another provider.  GYNECOLOGIC HISTORY: No LMP recorded. Patient has had a hysterectomy. Contraception:Hysterectomy Menopausal hormone therapy: n/a Last mammogram: n/a Last pap smear: 09-18-15 LSIL:Pos HR HPV;colpo/vulvar bx 10-11-15--VAIN II and condyloma of vulva        OB History    Gravida Para Term Preterm AB TAB SAB Ectopic Multiple Living   1    1     0         Patient Active Problem List   Diagnosis Date Noted  . Tobacco abuse 09/27/2015  . Nonallopathic lesion of sacral region 04/10/2015  . SI (sacroiliac) joint dysfunction 02/20/2015  . Leg length discrepancy 02/20/2015  . Hip pain 01/07/2015  . Allergic rhinitis, cause unspecified 05/04/2014  . Aortic stenosis, moderate 02/20/2014  . Bipolar disorder ([place]) 02/05/2014  . Bicuspid aortic valve 01/27/2013  . Migraine with status migrainosus 01/27/2013  . Hyperlipidemia with target LDL less than 130 01/27/2013  . Routine general medical examination at a health care facility 10/20/2011  . Mild persistent asthma 04/21/2011  . Essential hypertension, benign 04/21/2011    Past Medical History  Diagnosis Date  . Allergy   . Arthritis     In hips  . Hypertension   . Bicuspid aortic valve     moderate AS by echo 08/2015 with mean AVG 58mmHg and AVA 1.1cm2  . Alcohol abuse, in remission 2012  . Abnormal Pap smear of cervix      --recurrent ascus w/Pos. HR HPV  . Bipolar disorder ([place])   . ADD (attention deficit disorder)   . Headache   . Tobacco abuse 09/27/2015  . Hyperlipidemia with target LDL less than 130 01/27/2013  . Anxiety   . Muscle spasms of both lower extremities     both hips  . Asthma     rare inhaler use  . Aortic stenosis   . 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 Hip surgery  1981    Hip Reset   . Tympanostomy tube placement  1981/1982  . Hernia repair  1981/1982  . Tonsillectomy and adenoidectomy  1990  . Hip surgery  1990    Plate was reconstructed/ took out growth plate in Right knee  . Hip surgery  1992    Plate removed in Left hip  . Anterior cruciate ligament repair  1993  . Lymph node biopsy  1995  . Nasal septum surgery  2002  . Abdominal hysterectomy  02/06/2009    Robotic total laparoscopic hysterectomy  . 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 Colposcopy  10/2008    CIN I & II  .  Colposcopy  07/2000    Neg. ECC  . Colposcopy  06/2001    CIN I  . Colposcopy  08/2004    ECC--atypia  . Cardiac catheterization  June 2015    no CAD - moderate AS noted  . Left and right heart catheterization with coronary angiogram N/A 05/18/2014    Procedure: LEFT AND RIGHT HEART CATHETERIZATION WITH CORONARY ANGIOGRAM;  Surgeon: Jettie Booze, MD;  Location: [facility] CATH LAB;  Service: Cardiovascular;  Laterality: N/A;  . Colposcopy N/A 01/21/2016    Procedure: COLPOSCOPY with vaginal biopsy with CO 2 Laser of Vaginal and vulvar condyloma;  Surgeon: [name], MD;  Location: [facility];  Service: Gynecology;  Laterality: N/A;  [name] will be here 2/21 for 1115 case confirmed 01/16/15 - TS    Current Outpatient Prescriptions  Medication Sig Dispense Refill  . CALCIUM PO Take 2 tablets by mouth daily.    .  Fluticasone-Salmeterol (ADVAIR) 250-50 MCG/DOSE AEPB Inhale 1 puff into the lungs 2 (two) times daily as needed (shortness of breath).    . gabapentin (NEURONTIN) 100 MG capsule Take 2 capsules (200 mg total) by mouth at bedtime. 60 capsule 3  . lamoTRIgine (LAMICTAL) 200 MG tablet Take 2 tablets (400 mg total) by mouth daily. 180 tablet 3  . Lansoprazole (PREVACID PO) Take 1 tablet by mouth daily as needed (indigestion).    Marland Kitchen losartan (COZAAR) 50 MG tablet TAKE 1 TABLET BY MOUTH DAILY.---NEED APPOINTMENT FOR ADDITIONAL REFILLS 30 tablet 5  . Multiple Vitamin (MULTIVITAMIN WITH MINERALS) TABS tablet Take 1 tablet by mouth daily.    . silver sulfADIAZINE (SILVADENE) 1 % cream Apply 1 application topically daily. 50 g 0  . SUMAtriptan (IMITREX) 100 MG tablet Take 100 mg by mouth daily as needed for migraine. May repeat in 2 hours if headache persists or recurs.    . traMADol (ULTRAM) 50 MG tablet Take 1 tablet (50 mg total) by mouth every 8 (eight) hours as needed. 90 tablet 0  . oxyCODONE (OXY IR/ROXICODONE) 5 MG immediate release tablet      No current facility-administered medications for this visit.     ALLERGIES: Demerol; Ibuprofen; Morphine and related; and Codeine  Family History  Problem Relation Age of Onset  . Cancer Neg Hx   . Hyperlipidemia Mother   . Hypertension Mother   . Thyroid disease Mother   . Hyperlipidemia Father   . Hypertension Father   . Migraines Father   . Hypertension Brother   . Hyperlipidemia Brother   . Thyroid disease Maternal Grandmother   . Thyroid disease Maternal Grandfather     Social History   Social History  . Marital Status: Single    Spouse Name: N/A  . Number of Children: N/A  . Years of Education: N/A   Occupational History  . Not on file.   Social History Main Topics  . Smoking status: Current Every Day Smoker -- 0.25 packs/day for 12 years    Types: Cigarettes  . Smokeless tobacco: Never Used  . Alcohol Use: No  . Drug Use: No   . Sexual Activity:    Partners: Male    Birth Control/ Protection: Surgical     Comment: Hysterectomy   Other Topics Concern  . Not on file   Social History Narrative    ROS:  Pertinent items are noted in HPI.  PHYSICAL EXAMINATION:    BP 106/60 mmHg  Pulse 70  Ht 5' (1.524 m)  [employer]  112 lb 3.2 oz (50.894 kg)  BMI 21.91 kg/m2    General appearance: alert, cooperative and appears stated age   Pelvic: External genitalia: multiple areas of CO2 laser ablation healing.  Some with and some without eschars.  Areas cleansed with saline and gauze sponge.              Urethra:  normal appearing urethra with no masses, tenderness or lesions               Chaperone was present for exam.  ASSESSMENT  Healing well from CO2 laser to condyloma.   PLAN  Counseled regarding treatment.  No need to scrub the areas, just wash with soap and water and use a wash cloth. Continue Silvadene cream once daily.  Percocet Rx given.  #30. Follow up in one week.  Return to light duty at work tomorrow.   An After Visit Summary was printed and given to the patient.

## 2016-02-07 ENCOUNTER — Telehealth: Payer: Self-pay | Admitting: Obstetrics and Gynecology

## 2016-02-07 NOTE — Telephone Encounter (Signed)
Signed letter faxed to [phone] with cover sheet and confirmation. Notified patient. She is agreeable.  Routing to provider for final review. Patient agreeable to disposition. Will close encounter.

## 2016-02-07 NOTE — Telephone Encounter (Signed)
I would suggest no lifting over 20 pounds and no climbing on ladders at this time.  Please check with the patient to see if there are other restrictions that are appropriate for her work, as I do not know all of her responsibilities at the [employer] where she works.   Thank you.

## 2016-02-07 NOTE — Telephone Encounter (Signed)
Patient needing a letter for work stating her restrictions. Patient said she can pick this up. Best # to reach: [phone]

## 2016-02-07 NOTE — Telephone Encounter (Signed)
Spoke with patient. Advised of message as seen below from [place]. She is agreeable and states that she mainly is performing lifting and climbing at work. Advised I will notify Dr.Silva so return to work letter can be written. Patient is requesting letter be faxed to her attention at [phone].   Letter written and to Dr.Silva for review and signature for fax.

## 2016-02-07 NOTE — Telephone Encounter (Signed)
Patient was seen in office for post operative follow up on 02/06/2016 with Dr.Silva. She was advised she may return to work today 02/07/2016 performing light work. Patient is calling requesting a return to work letter with restrictions.  Dr.Silva, please advise restrictions you would like patient to follow for return to work letter. I will be happy to write letter.

## 2016-02-12 ENCOUNTER — Ambulatory Visit (INDEPENDENT_AMBULATORY_CARE_PROVIDER_SITE_OTHER): Payer: Managed Care, Other (non HMO) | Admitting: Obstetrics and Gynecology

## 2016-02-12 ENCOUNTER — Encounter: Payer: Self-pay | Admitting: Obstetrics and Gynecology

## 2016-02-12 VITALS — BP 110/70 | HR 70 | Ht 60.0 in | Wt 117.4 lb

## 2016-02-12 DIAGNOSIS — Z9889 Other specified postprocedural states: Secondary | ICD-10-CM

## 2016-02-12 NOTE — Progress Notes (Signed)
Patient ID: Danielle Obrien, female   DOB: 12-Nov-1981, 35 y.o.   MRN: [id number] GYNECOLOGY  VISIT   HPI: 35 y.o.   Single  Caucasian  female   G1P0010 with No LMP recorded. Patient has had a hysterectomy.   here for follow up COLPOSCOPY with vaginal biopsy with CO 2 Laser of Vaginal and vulvar condyloma 01/21/16.  Back at work.  Feels deconditioned as she has not lifted as much.  Patient being asked to lift awkward shaped boxes even if the weight is under 15 pounds.  Talked to her supervisor about her surgery so her supervisor had better understanding.   Patient is using much less pain medication.  GYNECOLOGIC HISTORY: No LMP recorded. Patient has had a hysterectomy. Contraception:hysterectomy Menopausal hormone therapy: none Last mammogram: n/a Last pap smear: 09-18-15 LSIL:Pos HR HPV;colpo/vulvar bx 10-11-15--VAIN II and condyloma of vulva.        OB History    Gravida Para Term Preterm AB TAB SAB Ectopic Multiple Living   1    1     0         Patient Active Problem List   Diagnosis Date Noted  . Tobacco abuse 09/27/2015  . Nonallopathic lesion of sacral region 04/10/2015  . SI (sacroiliac) joint dysfunction 02/20/2015  . Leg length discrepancy 02/20/2015  . Hip pain 01/07/2015  . Allergic rhinitis, cause unspecified 05/04/2014  . Aortic stenosis, moderate 02/20/2014  . Bipolar disorder ([place]) 02/05/2014  . Bicuspid aortic valve 01/27/2013  . Migraine with status migrainosus 01/27/2013  . Hyperlipidemia with target LDL less than 130 01/27/2013  . Routine general medical examination at a health care facility 10/20/2011  . Mild persistent asthma 04/21/2011  . Essential hypertension, benign 04/21/2011    Past Medical History  Diagnosis Date  . Allergy   . Arthritis     In hips  . Hypertension   . Bicuspid aortic valve     moderate AS by echo 08/2015 with mean AVG 35mmHg and AVA 1.1cm2  . Alcohol abuse, in remission 2012  . Abnormal Pap smear of cervix    --recurrent ascus w/Pos. HR HPV  . Bipolar disorder ([place])   . ADD (attention deficit disorder)   . Headache   . Tobacco abuse 09/27/2015  . Hyperlipidemia with target LDL less than 130 01/27/2013  . Anxiety   . Muscle spasms of both lower extremities     both hips  . Asthma     rare inhaler use  . Aortic stenosis   . 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 Hip surgery  1981    Hip Reset   . Tympanostomy tube placement  1981/1982  . Hernia repair  1981/1982  . Tonsillectomy and adenoidectomy  1990  . Hip surgery  1990    Plate was reconstructed/ took out growth plate in Right knee  . Hip surgery  1992    Plate removed in Left hip  . Anterior cruciate ligament repair  1993  . Lymph node biopsy  1995  . Nasal septum surgery  2002  . Abdominal hysterectomy  02/06/2009    Robotic total laparoscopic hysterectomy  . 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 Colposcopy  10/2008    CIN I & II  . Colposcopy  07/2000    Neg.  ECC  . Colposcopy  06/2001    CIN I  . Colposcopy  08/2004    ECC--atypia  . Cardiac catheterization  June 2015    no CAD - moderate AS noted  . Left and right heart catheterization with coronary angiogram N/A 05/18/2014    Procedure: LEFT AND RIGHT HEART CATHETERIZATION WITH CORONARY ANGIOGRAM;  Surgeon: Jettie Booze, MD;  Location: [facility] CATH LAB;  Service: Cardiovascular;  Laterality: N/A;  . Colposcopy N/A 01/21/2016    Procedure: COLPOSCOPY with vaginal biopsy with CO 2 Laser of Vaginal and vulvar condyloma;  Surgeon: [name], MD;  Location: [facility];  Service: Gynecology;  Laterality: N/A;  [name] will be here 2/21 for 1115 case confirmed 01/16/15 - TS    Current Outpatient Prescriptions  Medication Sig Dispense Refill  . CALCIUM PO Take 2 tablets by mouth daily.    .  Fluticasone-Salmeterol (ADVAIR) 250-50 MCG/DOSE AEPB Inhale 1 puff into the lungs 2 (two) times daily as needed (shortness of breath).    . gabapentin (NEURONTIN) 100 MG capsule Take 2 capsules (200 mg total) by mouth at bedtime. 60 capsule 3  . lamoTRIgine (LAMICTAL) 200 MG tablet Take 2 tablets (400 mg total) by mouth daily. 180 tablet 3  . Lansoprazole (PREVACID PO) Take 1 tablet by mouth daily as needed (indigestion).    Marland Kitchen losartan (COZAAR) 50 MG tablet TAKE 1 TABLET BY MOUTH DAILY.---NEED APPOINTMENT FOR ADDITIONAL REFILLS 30 tablet 5  . Multiple Vitamin (MULTIVITAMIN WITH MINERALS) TABS tablet Take 1 tablet by mouth daily.    Marland Kitchen oxyCODONE (OXY IR/ROXICODONE) 5 MG immediate release tablet     . oxyCODONE-acetaminophen (PERCOCET) 5-325 MG tablet Take 1 tablet by mouth every 4 (four) hours as needed. use only as much as needed to relieve pain 30 tablet 0  . silver sulfADIAZINE (SILVADENE) 1 % cream Apply 1 application topically daily. 50 g 0  . SUMAtriptan (IMITREX) 100 MG tablet Take 100 mg by mouth daily as needed for migraine. May repeat in 2 hours if headache persists or recurs.    . traMADol (ULTRAM) 50 MG tablet Take 1 tablet (50 mg total) by mouth every 8 (eight) hours as needed. 90 tablet 0   No current facility-administered medications for this visit.     ALLERGIES: Demerol; Ibuprofen; Morphine and related; and Codeine  Family History  Problem Relation Age of Onset  . Cancer Neg Hx   . Hyperlipidemia Mother   . Hypertension Mother   . Thyroid disease Mother   . Hyperlipidemia Father   . Hypertension Father   . Migraines Father   . Hypertension Brother   . Hyperlipidemia Brother   . Thyroid disease Maternal Grandmother   . Thyroid disease Maternal Grandfather     Social History   Social History  . Marital Status: Single    Spouse Name: N/A  . Number of Children: N/A  . Years of Education: N/A   Occupational History  . Not on file.   Social History Main Topics  .  Smoking status: Current Every Day Smoker -- 0.25 packs/day for 12 years    Types: Cigarettes  . Smokeless tobacco: Never Used  . Alcohol Use: No  . Drug Use: No  . Sexual Activity:    Partners: Male    Birth Control/ Protection: Surgical     Comment: Hysterectomy   Other Topics Concern  . Not on file   Social History Narrative    ROS:  Pertinent items  are noted in HPI.  PHYSICAL EXAMINATION:    BP 110/70 mmHg  Pulse 70  Ht 5' (1.524 m)  Wt 117 lb 6.4 oz (53.252 kg)  BMI 22.93 kg/m2    General appearance: alert, cooperative and appears stated age   Pelvic: External genitalia:  Multiple areas of treatment from CO2 laser.  All treatment areas epithelialized.  2 eschars remaining - one on the left and one on the right vulva.              Urethra:  normal appearing urethra with no masses, tenderness or lesions              Bartholins and Skenes: normal                 Vagina: normal appearing vagina with normal color and discharge, no lesions.  Suture present.  Mucosa looks normal.               Cervix: absent      Bimanual Exam:  Uterus:  uterus absent              Adnexa: normal adnexa and no mass, fullness, tenderness            Chaperone was present for exam.  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 Doing well.   PLAN  Counseled regarding good healing response.  Patient will take tramadol and tylenol that she has at home for pain prn.  Continue Silvadene cream.  Follow up in 2 weeks.  Pap recall for August 2017.    An After Visit Summary was printed and given to the patient.  ___15___ minutes face to face time of which over 50% was spent in counseling.

## 2016-02-24 ENCOUNTER — Encounter: Payer: Self-pay | Admitting: Obstetrics and Gynecology

## 2016-02-24 ENCOUNTER — Ambulatory Visit (INDEPENDENT_AMBULATORY_CARE_PROVIDER_SITE_OTHER): Payer: Managed Care, Other (non HMO) | Admitting: Obstetrics and Gynecology

## 2016-02-24 VITALS — BP 110/70 | HR 80 | Ht 60.0 in | Wt 114.4 lb

## 2016-02-24 DIAGNOSIS — Z9889 Other specified postprocedural states: Secondary | ICD-10-CM

## 2016-02-24 NOTE — Progress Notes (Signed)
Patient ID: Danielle Obrien, female   DOB: [birth year], 35 y.o.   MRN: [id number] GYNECOLOGY  VISIT   HPI: 35 y.o.   Single  Caucasian  female   G1P0010 with No LMP recorded. Patient has had a hysterectomy.   here for 5 weeks post COLPOSCOPY with vaginal biopsy with CO 2 Laser of Vaginal and vulvar condyloma (N/A Vagina ).  Wants light duty for 2 more weeks.  Having discomfort lifting at work.  Worried about her vaginal suture.  Feeling better overall.  Left buttock side is still a little uncomfortable.  GYNECOLOGIC HISTORY: No LMP recorded. Patient has had a hysterectomy. Contraception:Hysterectomy Menopausal hormone therapy: none Last mammogram: n/a Last pap smear: 09-18-15 LSIL:Pos HR HPV;colpo/vulvar bx 10-11-15--VAIN II and condyloma of vulva.        OB History    Gravida Para Term Preterm AB TAB SAB Ectopic Multiple Living   1    1     0         Patient Active Problem List   Diagnosis Date Noted  . Tobacco abuse 09/27/2015  . Nonallopathic lesion of sacral region 04/10/2015  . SI (sacroiliac) joint dysfunction 02/20/2015  . Leg length discrepancy 02/20/2015  . Hip pain 01/07/2015  . Allergic rhinitis, cause unspecified 05/04/2014  . Aortic stenosis, moderate 02/20/2014  . Bipolar disorder ([place]) 02/05/2014  . Bicuspid aortic valve 01/27/2013  . Migraine with status migrainosus 01/27/2013  . Hyperlipidemia with target LDL less than 130 01/27/2013  . Routine general medical examination at a health care facility 10/20/2011  . Mild persistent asthma 04/21/2011  . Essential hypertension, benign 04/21/2011    Past Medical History  Diagnosis Date  . Allergy   . Arthritis     In hips  . Hypertension   . Bicuspid aortic valve     moderate AS by echo 08/2015 with mean AVG 18mmHg and AVA 1.1cm2  . Alcohol abuse, in remission 2012  . Abnormal Pap smear of cervix     --recurrent ascus w/Pos. HR HPV  . Bipolar disorder ([place])   . ADD (attention deficit disorder)   .  Headache   . Tobacco abuse 09/27/2015  . Hyperlipidemia with target LDL less than 130 01/27/2013  . Anxiety   . Muscle spasms of both lower extremities     both hips  . Asthma     rare inhaler use  . Aortic stenosis   . 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 Hip surgery  1981    Hip Reset   . Tympanostomy tube placement  1981/1982  . Hernia repair  1981/1982  . Tonsillectomy and adenoidectomy  1990  . Hip surgery  1990    Plate was reconstructed/ took out growth plate in Right knee  . Hip surgery  1992    Plate removed in Left hip  . Anterior cruciate ligament repair  1993  . Lymph node biopsy  1995  . Nasal septum surgery  2002  . Abdominal hysterectomy  02/06/2009    Robotic total laparoscopic hysterectomy  . 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 Colposcopy  10/2008    CIN I & II  . Colposcopy  07/2000    Neg. ECC  . Colposcopy  06/2001    CIN I  . Colposcopy  08/2004  ECC--atypia  . Cardiac catheterization  June 2015    no CAD - moderate AS noted  . Left and right heart catheterization with coronary angiogram N/A 05/18/2014    Procedure: LEFT AND RIGHT HEART CATHETERIZATION WITH CORONARY ANGIOGRAM;  Surgeon: Jettie Booze, MD;  Location: [facility] CATH LAB;  Service: Cardiovascular;  Laterality: N/A;  . Colposcopy N/A 01/21/2016    Procedure: COLPOSCOPY with vaginal biopsy with CO 2 Laser of Vaginal and vulvar condyloma;  Surgeon: [name], MD;  Location: [place] [facility];  Service: Gynecology;  Laterality: N/A;  [name] will be here 2/21 for 1115 case confirmed 01/16/15 - TS    Current Outpatient Prescriptions  Medication Sig Dispense Refill  . CALCIUM PO Take 2 tablets by mouth daily.    . Fluticasone-Salmeterol (ADVAIR) 250-50 MCG/DOSE AEPB Inhale 1 puff into the lungs 2 (two) times daily as needed  (shortness of breath).    . gabapentin (NEURONTIN) 100 MG capsule Take 2 capsules (200 mg total) by mouth at bedtime. 60 capsule 3  . lamoTRIgine (LAMICTAL) 200 MG tablet Take 2 tablets (400 mg total) by mouth daily. 180 tablet 3  . Lansoprazole (PREVACID PO) Take 1 tablet by mouth daily as needed (indigestion).    Marland Kitchen losartan (COZAAR) 50 MG tablet TAKE 1 TABLET BY MOUTH DAILY.---NEED APPOINTMENT FOR ADDITIONAL REFILLS 30 tablet 5  . Multiple Vitamin (MULTIVITAMIN WITH MINERALS) TABS tablet Take 1 tablet by mouth daily.    Marland Kitchen oxyCODONE (OXY IR/ROXICODONE) 5 MG immediate release tablet     . oxyCODONE-acetaminophen (PERCOCET) 5-325 MG tablet Take 1 tablet by mouth every 4 (four) hours as needed. use only as much as needed to relieve pain 30 tablet 0  . silver sulfADIAZINE (SILVADENE) 1 % cream Apply 1 application topically daily. 50 g 0  . SUMAtriptan (IMITREX) 100 MG tablet Take 100 mg by mouth daily as needed for migraine. May repeat in 2 hours if headache persists or recurs.    . traMADol (ULTRAM) 50 MG tablet Take 1 tablet (50 mg total) by mouth every 8 (eight) hours as needed. 90 tablet 0   No current facility-administered medications for this visit.     ALLERGIES: Demerol; Ibuprofen; Morphine and related; and Codeine  Family History  Problem Relation Age of Onset  . Cancer Neg Hx   . Hyperlipidemia Mother   . Hypertension Mother   . Thyroid disease Mother   . Hyperlipidemia Father   . Hypertension Father   . Migraines Father   . Hypertension Brother   . Hyperlipidemia Brother   . Thyroid disease Maternal Grandmother   . Thyroid disease Maternal Grandfather     Social History   Social History  . Marital Status: Single    Spouse Name: N/A  . Number of Children: N/A  . Years of Education: N/A   Occupational History  . Not on file.   Social History Main Topics  . Smoking status: Current Every Day Smoker -- 0.25 packs/day for 12 years    Types: Cigarettes  . Smokeless  tobacco: Never Used  . Alcohol Use: No  . Drug Use: No  . Sexual Activity:    Partners: Male    Birth Control/ Protection: Surgical     Comment: Hysterectomy   Other Topics Concern  . Not on file   Social History Narrative    ROS:  Pertinent items are noted in HPI.  PHYSICAL EXAMINATION:    BP 110/70 mmHg  Pulse 80  Ht 5' (  1.524 m)  Wt 114 lb 6.4 oz (51.891 kg)  BMI 22.34 kg/m2    General appearance: alert, cooperative and appears stated age  Pelvic: External genitalia:   Multiple hypopigmented areas of vulva and medial thighs.  Minimal tenderness of left labia majora and left medial thigh.               Urethra:  normal appearing urethra with no masses, tenderness or lesions              Bartholins and Skenes: normal                 Vagina: normal appearing vagina with normal color and discharge, area of prior suture visible at vaginal apex.  No suture remaining. Mild tenderness of midline vaginal cuff.              Cervix: absent        Bimanual Exam:  Uterus:  uterus absent              Adnexa: normal adnexa and no mass, fullness, tenderness            Chaperone was present for exam.  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 Doing well overall.   PLAN  Continue restricted lifting under 20 pounds until April 11, when all duties return to normal.  Letter written for patient to take to her work.  Keep treatment areas moisturized with Eucerin cream.  May stop Silvadene at this point. Follow up in 1 month.  Will have a repap in August 2017.  I will continue to follow this patient in her care.  Patient expresses appreciation in her care.   An After Visit Summary was printed and given to the patient.

## 2016-02-25 ENCOUNTER — Telehealth: Payer: Self-pay | Admitting: Internal Medicine

## 2016-02-25 NOTE — Telephone Encounter (Signed)
Pt called stated Dr. Tamala Julian is treating her for her hip pain, right now tramadol and gabapentin is not helping. Pt is experiencing muscle spasm and that cause her to have migraine. Please help, not sure what she need to do.

## 2016-02-25 NOTE — Telephone Encounter (Signed)
Spoke with patient. Having increasing issues. Scheduled for Thursday at 8am

## 2016-02-25 NOTE — Telephone Encounter (Signed)
Make sure is taking a tramadoland tylenol together.  If she is and still having pain will need to see her for an injection soon.  lOK to double book if needed this week.

## 2016-02-27 ENCOUNTER — Encounter: Payer: Self-pay | Admitting: Family Medicine

## 2016-02-27 ENCOUNTER — Ambulatory Visit (INDEPENDENT_AMBULATORY_CARE_PROVIDER_SITE_OTHER): Payer: Managed Care, Other (non HMO) | Admitting: Family Medicine

## 2016-02-27 ENCOUNTER — Other Ambulatory Visit: Payer: Self-pay | Admitting: *Deleted

## 2016-02-27 VITALS — BP 116/80 | HR 72 | Wt 115.0 lb

## 2016-02-27 DIAGNOSIS — Z720 Tobacco use: Secondary | ICD-10-CM

## 2016-02-27 DIAGNOSIS — M533 Sacrococcygeal disorders, not elsewhere classified: Secondary | ICD-10-CM | POA: Diagnosis not present

## 2016-02-27 DIAGNOSIS — M2170 Unequal limb length (acquired), unspecified site: Secondary | ICD-10-CM | POA: Diagnosis not present

## 2016-02-27 MED ORDER — GABAPENTIN 300 MG PO CAPS: 300.0000 mg | ORAL_CAPSULE | Freq: Two times a day (BID) | ORAL | Status: DC

## 2016-02-27 MED ORDER — TRAMADOL HCL 50 MG PO TABS: 100.0000 mg | ORAL_TABLET | Freq: Two times a day (BID) | ORAL | Status: DC | PRN

## 2016-02-27 MED ORDER — TIZANIDINE HCL 4 MG PO TABS: 4.0000 mg | ORAL_TABLET | Freq: Every evening | ORAL | Status: DC

## 2016-02-27 NOTE — Progress Notes (Signed)
  [facility] [address] [place], [place] [address] Phone: [phone] Subjective:     CC: Left hip pain follow-upBack pain follow-up  [id number] Danielle Obrien is a 35 y.o. female coming in with complaint of left hip pain. Patient does give a past medical history significant for hip surgery on multiple occasions. 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 Patient needed plating which has been removed as well with the last surgery in 1992. Patient has had a leg length discrepancy since that time .    Patient was seen previously and was given custom orthotics and has been trying to change her leg length discrepancy over the course of time. Patient was to be doing home exercises, icing protocol, and does take tramadol for breakthrough pain.Patient unfortunately is having worsening pain. Having muscle spasms in the legs. As order new shoes and this is help somewhat. Been trying to do the exercises but was not doing them as regularly as possible. States that the tramadol does not seem to be helping as much. States that unfortunately she feels like she is needs to take more of it at this time. Patient states that he can be waking her up at night. Has been increasing her gabapentin somewhat which has been helpful. No numbness or weakness of the lower extremities but make it more difficult to do daily activities and work.    Past medical history, social, surgical and family history all reviewed in electronic medical record.   Review of Systems: No headache, visual changes, nausea, vomiting, diarrhea, constipation, dizziness, abdominal pain, skin rash, fevers, chills, night sweats, weight loss, swollen lymph nodes, body aches, joint swelling, muscle aches, chest pain, shortness of breath, mood changes.   Objective Blood pressure 116/80, pulse 72, weight 115 lb (52.164 kg).  General: Patient is slurring her words and does have pinpoint  pupils HEENT: Pinpoint pupils Respiratory: Patient's speak in full sentences and does not appear short of breath  Cardiovascular: No lower extremity edema, non tender, no erythema  Skin: Warm dry intact with no signs of infection or rash on extremities or on axial skeleton.  Abdomen: Soft nontender  Neuro: Cranial nerves II through XII are intact, neurovascularly intact in all extremities with 2+ DTRs and 2+ pulses.  Lymph: No lymphadenopathy of posterior or anterior cervical chain or axillae bilaterally.  Gait normal with good balance and coordination.  MSK:  Non tender with full range of motion and good stability and symmetric strength and tone of shoulders, elbows, wrist, , knee and ankles bilaterally.  Back exam shows that patient does have some mild pictures scoliosis of the lumbar spine. Patient does have a leg length discrepancy of a inch and a half on the left side being shorter. Patient's left-sided SI joint is tender to palpation  in does have a positive Faber test. Possibly worsen previous exam. Full strength distally..  Foot exam shows the patient does have overpronation of the right foot as well as oversupination of the hindfoot. Patient Mild bunion and bunionette formation of the feet bilaterally. Minor increasing tenderness and soreness of the plantar aspect of the feet bilaterally but no specific findings.   Impression and Recommendations:     This case required medical decision making of moderate complexity.    HVl

## 2016-02-27 NOTE — Assessment & Plan Note (Signed)
Patient is wearing the orthotics and encourage her to continue to do so.

## 2016-02-27 NOTE — Assessment & Plan Note (Signed)
Encourage patient to stop smoking

## 2016-02-27 NOTE — Patient Instructions (Addendum)
Good to see you  Ice is your friend Tramadol 100mg  twice daily with tylenol still Gabapentin 300mg  twice daily  Zanaflex at night if needed.  Go back on spiniach and take orange juice with it to help iron leve.  You have been dropping looking at your labs.  I love the shoes  See me again in 6 weeks.

## 2016-02-27 NOTE — Assessment & Plan Note (Signed)
Patient is having increasing back pain again. Patient is on his Romberg quite some time. We will increase patient's tramadol 200 mg up to 2 times a day. Patient has other pain medications but does not take him on a regular basis. Warned of potential interactions. In addition of this we discussed the over-the-counter medications due to these more regularly and patient did have an increase in her gabapentin. This will be beneficial. Patient does have worsening symptoms and is imaging will be warranted. Patient will start wearing the new shoes which she has not been doing which I think will be helpful as well. Follow-up again in 4-6 weeks for further evaluation and treatment.

## 2016-03-03 ENCOUNTER — Ambulatory Visit: Payer: Managed Care, Other (non HMO) | Admitting: Family Medicine

## 2016-03-04 ENCOUNTER — Other Ambulatory Visit: Payer: Self-pay | Admitting: Cardiology

## 2016-03-19 ENCOUNTER — Ambulatory Visit (INDEPENDENT_AMBULATORY_CARE_PROVIDER_SITE_OTHER): Payer: Managed Care, Other (non HMO) | Admitting: Cardiology

## 2016-03-19 ENCOUNTER — Encounter: Payer: Self-pay | Admitting: Cardiology

## 2016-03-19 VITALS — BP 100/70 | HR 98 | Ht 60.0 in | Wt 117.0 lb

## 2016-03-19 DIAGNOSIS — I10 Essential (primary) hypertension: Secondary | ICD-10-CM

## 2016-03-19 DIAGNOSIS — I350 Nonrheumatic aortic (valve) stenosis: Secondary | ICD-10-CM | POA: Diagnosis not present

## 2016-03-19 DIAGNOSIS — Q231 Congenital insufficiency of aortic valve: Secondary | ICD-10-CM

## 2016-03-19 NOTE — Progress Notes (Signed)
Cardiology Office Note    Date:  03/19/2016   ID:  Danielle Obrien, DOB 03/07/1981, MRN [id number]  PCP:  [name], MD  Cardiologist:  Sueanne Margarita, MD   Chief Complaint  Patient presents with  . Aortic Stenosis  . Hypertension    History of Present Illness:  Danielle Obrien is a 35 y.o. female who has a history of HTN, bicuspid AV with a heart murmur and asthma. She is bipolar as well. Cath done for CP after abnormal GXT with hypotensive BP response  showed normal findings  with moderate AS noted. AVA of 1.3. She is doing well today. She denies any chest pain (except when very stressed or fatiued), SOB, DOE, LE edema,dizziness, palpitations or syncope. She has had some fatigue recently but found out that she is anemic and is now on Iron.     Past Medical History  Diagnosis Date  . Allergy   . Arthritis     In hips  . Hypertension   . Bicuspid aortic valve     moderate AS by echo 08/2015 with mean AVG 60mmHg and AVA 1.1cm2  . Alcohol abuse, in remission 2012  . Abnormal Pap smear of cervix     --recurrent ascus w/Pos. HR HPV  . Bipolar disorder ([place])   . ADD (attention deficit disorder)   . Headache   . Tobacco abuse 09/27/2015  . Hyperlipidemia with target LDL less than 130 01/27/2013  . Anxiety   . Muscle spasms of both lower extremities     both hips  . Asthma     rare inhaler use  . Aortic stenosis   . 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 Hip surgery  1981    Hip Reset   . Tympanostomy tube placement  1981/1982  . Hernia repair  1981/1982  . Tonsillectomy and adenoidectomy  1990  . Hip surgery  1990    Plate was reconstructed/ took out growth plate in Right knee  . Hip surgery  1992    Plate removed in Left hip  . Anterior cruciate ligament repair  1993  . Lymph node biopsy  1995  . Nasal septum surgery  2002  . Abdominal  hysterectomy  02/06/2009    Robotic total laparoscopic hysterectomy  . 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 Colposcopy  10/2008    CIN I & II  . Colposcopy  07/2000    Neg. ECC  . Colposcopy  06/2001    CIN I  . Colposcopy  08/2004    ECC--atypia  . Cardiac catheterization  June 2015    no CAD - moderate AS noted  . Left and right heart catheterization with coronary angiogram N/A 05/18/2014    Procedure: LEFT AND RIGHT HEART CATHETERIZATION WITH CORONARY ANGIOGRAM;  Surgeon: Jettie Booze, MD;  Location: [facility] C[facility] LAB;  Service: Cardiovascular;  Laterality: N/A;  . Colposcopy N/A 01/21/2016    Procedure: COLPOSCOPY with vaginal biopsy with CO 2 Laser of Vaginal and vulvar condyloma;  Surgeon: [name], MD;  Location: [facility];  Service: Gynecology;  Laterality: N/A;  [name] will be here 2/21 for 1115 case confirmed 01/16/15 - TS    Current Medications: Outpatient Prescriptions Prior to Visit  Medication Sig Dispense Refill  . CALCIUM  PO Take 2 tablets by mouth daily.    . Fluticasone-Salmeterol (ADVAIR) 250-50 MCG/DOSE AEPB Inhale 1 puff into the lungs 2 (two) times daily as needed (shortness of breath).    . gabapentin (NEURONTIN) 300 MG capsule Take 1 capsule (300 mg total) by mouth 2 (two) times daily. 180 capsule 1  . lamoTRIgine (LAMICTAL) 200 MG tablet Take 2 tablets (400 mg total) by mouth daily. 180 tablet 3  . Lansoprazole (PREVACID PO) Take 1 tablet by mouth daily as needed (indigestion).    Marland Kitchen losartan (COZAAR) 50 MG tablet TAKE 1 TABLET BY MOUTH DAILY 30 tablet 3  . Multiple Vitamin (MULTIVITAMIN WITH MINERALS) TABS tablet Take 1 tablet by mouth daily.    . silver sulfADIAZINE (SILVADENE) 1 % cream Apply 1 application topically daily. 50 g 0  . SUMAtriptan (IMITREX) 100 MG tablet Take 100 mg by mouth daily as needed for migraine. May repeat in 2 hours if headache persists or recurs.    Marland Kitchen tiZANidine (ZANAFLEX) 4  MG tablet Take 1 tablet (4 mg total) by mouth Nightly. 30 tablet 2  . traMADol (ULTRAM) 50 MG tablet Take 2 tablets (100 mg total) by mouth every 12 (twelve) hours as needed. 180 tablet 0  . oxyCODONE (OXY IR/ROXICODONE) 5 MG immediate release tablet Reported on 03/19/2016    . oxyCODONE-acetaminophen (PERCOCET) 5-325 MG tablet Take 1 tablet by mouth every 4 (four) hours as needed. use only as much as needed to relieve pain (Patient not taking: Reported on 03/19/2016) 30 tablet 0   No facility-administered medications prior to visit.     Allergies:   Demerol; Ibuprofen; Morphine and related; and Codeine   Social History   Social History  . Marital Status: Single    Spouse Name: N/A  . Number of Children: N/A  . Years of Education: N/A   Social History Main Topics  . Smoking status: Current Every Day Smoker -- 0.25 packs/day for 12 years    Types: Cigarettes  . Smokeless tobacco: Never Used  . Alcohol Use: No  . Drug Use: No  . Sexual Activity:    Partners: Male    Birth Control/ Protection: Surgical     Comment: Hysterectomy   Other Topics Concern  . None   Social History Narrative     Family History:  The patient's family history includes Hyperlipidemia in her brother, father, and mother; Hypertension in her brother, father, and mother; Migraines in her father; Thyroid disease in her maternal grandfather, maternal grandmother, and mother. There is no history of Cancer.   ROS:   Please see the history of present illness.    Review of Systems  Constitution: Negative.  HENT: Negative.   Eyes: Negative.   Cardiovascular: Negative.   Respiratory: Negative.   Skin: Negative.   Musculoskeletal: Negative.   Gastrointestinal: Negative.   Genitourinary: Negative.   Neurological: Negative.   Psychiatric/Behavioral: Negative.    All other systems reviewed and are negative.   PHYSICAL EXAM:   VS:  BP 100/70 mmHg  Pulse 98  Ht 5' (1.524 m)  Wt 117 lb (53.071 kg)  BMI 22.85  kg/m2   GEN: Well nourished, well developed, in no acute distress HEENT: normal Neck: no JVD, carotid bruits, or masses Cardiac: RRR; no rubs, or gallops,no edema.  Intact distal pulses bilaterally. 2/6 SM at RUSB to LLSB late peaking Respiratory:  clear to auscultation bilaterally, normal work of breathing GI: soft, nontender, nondistended, + BS MS: no deformity or atrophy Skin:  warm and dry, no rash Neuro:  Alert and Oriented x 3, Strength and sensation are intact Psych: euthymic mood, full affect  Wt Readings from Last 3 Encounters:  03/19/16 117 lb (53.071 kg)  02/27/16 115 lb (52.164 kg)  02/24/16 114 lb 6.4 oz (51.891 kg)      Studies/Labs Reviewed:   EKG:  EKG is not ordered today.    Recent Labs: 01/14/2016: ALT 19; TSH 0.64 01/20/2016: BUN 17; Creatinine, Ser 0.88; Hemoglobin 14.5; Platelets 150; Potassium 4.2; Sodium 137   Lipid Panel    Component Value Date/Time   CHOL 247* 01/14/2016 0928   TRIG 83.0 01/14/2016 0928   HDL 76.50 01/14/2016 0928   CHOLHDL 3 01/14/2016 0928   VLDL 16.6 01/14/2016 0928   LDLCALC 153* 01/14/2016 0928   LDLDIRECT 171.0 01/27/2013 1425    Additional studies/ records that were reviewed today include:  none    ASSESSMENT:    1. Aortic stenosis, moderate   2. Bicuspid aortic valve   3. Essential hypertension, benign      PLAN:  In order of problems listed above:  1. Aortic stenosis - moderate by cath and moderate to severe by echo. I will repeat an echo 08/2016 to evaluate for progression.  I have told her to call me if she has any CP, SOB, edema, dizziness or syncope as this may be an indication that the AS is progressing.   2. Bicuspid AV 3. HTN - well controlled and actually soft today.  I do not want her BP getting too low in setting of AS.  I have asked her to check her BP daily at home for a week and call with results.  Continue ARB at current dose until I have received the BP readings.    Medication Adjustments/Labs  and Tests Ordered: Current medicines are reviewed at length with the patient today.  Concerns regarding medicines are outlined above.  Medication changes, Labs and Tests ordered today are listed in the Patient Instructions below. There are no Patient Instructions on file for this visit.   Lurena Nida, MD  03/19/2016 8:51 AM    [facility] [place], [place], [place]  [address] Phone: [phone]; Fax: [phone]

## 2016-03-19 NOTE — Patient Instructions (Addendum)
Medication Instructions:  Your physician recommends that you continue on your current medications as directed. Please refer to the Current Medication list given to you today.   Labwork: None  Testing/Procedures: Your physician has requested that you have an echocardiogram in October, 2017. Echocardiography is a painless test that uses sound waves to create images of your heart. It provides your doctor with information about the size and shape of your heart and how well your heart's chambers and valves are working. This procedure takes approximately one hour. There are no restrictions for this procedure.  Follow-Up: Your physician wants you to follow-up in: 6 months with Dr. Radford Pax.  You will receive a reminder letter in the mail two months in advance. If you don't receive a letter, please call our office to schedule the follow-up appointment.   Any Other Special Instructions Will Be Listed Below (If Applicable). Please check your BLOOD PRESSURE daily for a week and call with results.   If you need a refill on your cardiac medications before your next appointment, please call your pharmacy.

## 2016-03-23 ENCOUNTER — Encounter: Payer: Self-pay | Admitting: Obstetrics and Gynecology

## 2016-03-23 ENCOUNTER — Ambulatory Visit (INDEPENDENT_AMBULATORY_CARE_PROVIDER_SITE_OTHER): Payer: Managed Care, Other (non HMO) | Admitting: Obstetrics and Gynecology

## 2016-03-23 VITALS — BP 106/64 | HR 80 | Ht 60.0 in | Wt 111.4 lb

## 2016-03-23 DIAGNOSIS — Z9889 Other specified postprocedural states: Secondary | ICD-10-CM

## 2016-03-23 NOTE — Patient Instructions (Signed)
Try Eucerin cream for moisturizing the skin.   You are doing well!

## 2016-03-23 NOTE — Progress Notes (Signed)
Patient ID: Danielle Obrien, female   DOB: 1981/11/26, 35 y.o.   MRN: [id number] GYNECOLOGY  VISIT   HPI: 35 y.o.   Single  Caucasian  female   G1P0010 with No LMP recorded. Patient has had a hysterectomy.   here for follow up visit.    Surgery on 01/21/16.  Using Silvadene cream for discomfort near her bottom.  This is not a significant discomfort.  Moisture helps the discomfort.   Back to a normal routine.    GYNECOLOGIC HISTORY: No LMP recorded. Patient has had a hysterectomy. Contraception:  Hysterectomy Menopausal hormone therapy:  None Last mammogram:  N/A Last pap smear:  09-18-15 LSIL:Pos HR HPV;colpo/vulvar bx 10-11-15--VAIN II and condyloma of vulva.         OB History    Gravida Para Term Preterm AB TAB SAB Ectopic Multiple Living   1    1     0         Patient Active Problem List   Diagnosis Date Noted  . Tobacco abuse 09/27/2015  . Nonallopathic lesion of sacral region 04/10/2015  . SI (sacroiliac) joint dysfunction 02/20/2015  . Leg length discrepancy 02/20/2015  . Hip pain 01/07/2015  . Allergic rhinitis, cause unspecified 05/04/2014  . Aortic stenosis, moderate 02/20/2014  . Bipolar disorder ([place]) 02/05/2014  . Bicuspid aortic valve 01/27/2013  . Migraine with status migrainosus 01/27/2013  . Hyperlipidemia with target LDL less than 130 01/27/2013  . Routine general medical examination at a health care facility 10/20/2011  . Mild persistent asthma 04/21/2011  . Essential hypertension, benign 04/21/2011    Past Medical History  Diagnosis Date  . Allergy   . Arthritis     In hips  . Hypertension   . Bicuspid aortic valve     moderate AS by echo 08/2015 with mean AVG 37mmHg and AVA 1.1cm2  . Alcohol abuse, in remission 2012  . Abnormal Pap smear of cervix     --recurrent ascus w/Pos. HR HPV  . Bipolar disorder ([place])   . ADD (attention deficit disorder)   . Headache   . Tobacco abuse 09/27/2015  . Hyperlipidemia with target LDL less than 130  01/27/2013  . Anxiety   . Muscle spasms of both lower extremities     both hips  . Asthma     rare inhaler use  . Aortic stenosis   . 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 Hip surgery  1981    Hip Reset   . Tympanostomy tube placement  1981/1982  . Hernia repair  1981/1982  . Tonsillectomy and adenoidectomy  1990  . Hip surgery  1990    Plate was reconstructed/ took out growth plate in Right knee  . Hip surgery  1992    Plate removed in Left hip  . Anterior cruciate ligament repair  1993  . Lymph node biopsy  1995  . Nasal septum surgery  2002  . Abdominal hysterectomy  02/06/2009    Robotic total laparoscopic hysterectomy  . 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 Colposcopy  10/2008    CIN I & II  . Colposcopy  07/2000    Neg. ECC  . Colposcopy  06/2001    CIN I  . Colposcopy  08/2004    ECC--atypia  . Cardiac  catheterization  June 2015    no CAD - moderate AS noted  . Left and right heart catheterization with coronary angiogram N/A 05/18/2014    Procedure: LEFT AND RIGHT HEART CATHETERIZATION WITH CORONARY ANGIOGRAM;  Surgeon: Jettie Booze, MD;  Location: [facility] CATH LAB;  Service: Cardiovascular;  Laterality: N/A;  . Colposcopy N/A 01/21/2016    Procedure: COLPOSCOPY with vaginal biopsy with CO 2 Laser of Vaginal and vulvar condyloma;  Surgeon: [name], MD;  Location: [facility];  Service: Gynecology;  Laterality: N/A;  [name] will be here 2/21 for 1115 case confirmed 01/16/15 - TS    Current Outpatient Prescriptions  Medication Sig Dispense Refill  . CALCIUM PO Take 2 tablets by mouth daily.    . Fluticasone-Salmeterol (ADVAIR) 250-50 MCG/DOSE AEPB Inhale 1 puff into the lungs 2 (two) times daily as needed (shortness of breath).    . gabapentin (NEURONTIN) 300 MG capsule Take 1 capsule (300 mg  total) by mouth 2 (two) times daily. 180 capsule 1  . LamoTRIgine 300 MG TB24 Take 1 tablet by mouth 2 (two) times daily.    . Lansoprazole (PREVACID PO) Take 1 tablet by mouth daily as needed (indigestion).    Marland Kitchen losartan (COZAAR) 50 MG tablet TAKE 1 TABLET BY MOUTH DAILY 30 tablet 3  . Multiple Vitamin (MULTIVITAMIN WITH MINERALS) TABS tablet Take 1 tablet by mouth daily.    . SUMAtriptan (IMITREX) 100 MG tablet Take 100 mg by mouth daily as needed for migraine. May repeat in 2 hours if headache persists or recurs.    Marland Kitchen tiZANidine (ZANAFLEX) 4 MG tablet Take 1 tablet (4 mg total) by mouth Nightly. 30 tablet 2  . traMADol (ULTRAM) 50 MG tablet Take 2 tablets (100 mg total) by mouth every 12 (twelve) hours as needed. 180 tablet 0   No current facility-administered medications for this visit.     ALLERGIES: Demerol; Ibuprofen; Morphine and related; and Codeine  Family History  Problem Relation Age of Onset  . Cancer Neg Hx   . Hyperlipidemia Mother   . Hypertension Mother   . Thyroid disease Mother   . Hyperlipidemia Father   . Hypertension Father   . Migraines Father   . Hypertension Brother   . Hyperlipidemia Brother   . Thyroid disease Maternal Grandmother   . Thyroid disease Maternal Grandfather     Social History   Social History  . Marital Status: Single    Spouse Name: N/A  . Number of Children: N/A  . Years of Education: N/A   Occupational History  . Not on file.   Social History Main Topics  . Smoking status: Current Every Day Smoker -- 0.25 packs/day for 12 years    Types: Cigarettes  . Smokeless tobacco: Never Used  . Alcohol Use: No  . Drug Use: No  . Sexual Activity:    Partners: Male    Birth Control/ Protection: Surgical     Comment: Hysterectomy   Other Topics Concern  . Not on file   Social History Narrative    ROS:  Pertinent items are noted in HPI.  PHYSICAL EXAMINATION:    BP 106/64 mmHg  Pulse 80  Ht 5' (1.524 m)  Wt 111 lb 6.4 oz  (50.531 kg)  BMI 21.76 kg/m2    General appearance: alert, cooperative and appears stated age   Pelvic: External genitalia:  no lesions              Urethra:  normal appearing urethra with no masses, tenderness or lesions              Bartholins and Skenes: normal                 Vagina: normal appearing vagina with normal color and discharge, no lesions              Cervix: absent               Bimanual Exam:  Uterus:  uterus absent              Adnexa: no mass, fullness, tenderness           Chaperone was present for exam.  ASSESSMENT  Status post colposcopy with vaginal biopsy and CO2 laser to vulvar condyloma and VAIN II.  Healing well.  Smoker.  PLAN  OK to return to all normal activity including sexual activity.  Discussed hydration to the tissues.  Discussed importance of smoking cessation.  Follow up for annual exam and pap in October 2017.      An After Visit Summary was printed and given to the patient.  ____20__ minutes face to face time of which over 50% was spent in counseling.

## 2016-04-14 ENCOUNTER — Telehealth: Payer: Self-pay

## 2016-04-14 MED ORDER — TRAMADOL HCL 50 MG PO TABS: 100.0000 mg | ORAL_TABLET | Freq: Two times a day (BID) | ORAL | Status: DC | PRN

## 2016-04-14 NOTE — Telephone Encounter (Signed)
Refill done.  

## 2016-04-14 NOTE — Telephone Encounter (Signed)
Patient is requesting a refill on her tramadol. Please follow up, thank you.

## 2016-05-21 ENCOUNTER — Ambulatory Visit: Payer: Managed Care, Other (non HMO) | Admitting: Family Medicine

## 2016-05-26 ENCOUNTER — Other Ambulatory Visit: Payer: Self-pay | Admitting: Family Medicine

## 2016-06-05 ENCOUNTER — Ambulatory Visit (INDEPENDENT_AMBULATORY_CARE_PROVIDER_SITE_OTHER): Payer: Managed Care, Other (non HMO) | Admitting: Family Medicine

## 2016-06-05 ENCOUNTER — Encounter: Payer: Self-pay | Admitting: Family Medicine

## 2016-06-05 VITALS — BP 108/80 | HR 82 | Ht 60.0 in | Wt 110.0 lb

## 2016-06-05 DIAGNOSIS — M533 Sacrococcygeal disorders, not elsewhere classified: Secondary | ICD-10-CM | POA: Diagnosis not present

## 2016-06-05 DIAGNOSIS — M2170 Unequal limb length (acquired), unspecified site: Secondary | ICD-10-CM | POA: Diagnosis not present

## 2016-06-05 MED ORDER — GABAPENTIN 300 MG PO CAPS: 600.0000 mg | ORAL_CAPSULE | Freq: Three times a day (TID) | ORAL | Status: DC

## 2016-06-05 NOTE — Assessment & Plan Note (Signed)
Made adjustments to orthotics again today. Danielle Obrien this will be beneficial. Likely will need new ones in 2018.

## 2016-06-05 NOTE — Assessment & Plan Note (Signed)
Made changes to patient's gabapentin. We will increase her dose to 600 mg 3 times a day. Patient does have tramadol for breakthrough pain but is going to start titrating down over the course of time here. His lungs patient continues to do well we will see her in 3 month intervals. Spent  25 minutes with patient face-to-face and had greater than 50% of counseling including as described above in assessment and plan.

## 2016-06-05 NOTE — Progress Notes (Signed)
  [name] [address] [place], [place] [address] Phone: [phone] Subjective:     CC: Left hip pain follow-upBack pain follow-up  [id number] Danielle Obrien is a 34 y.o. female coming in with complaint of left hip pain. Patient does give a past medical history significant for hip surgery on multiple occasions. 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 Patient needed plating which has been removed as well with the last surgery in 1992. Patient has had a leg length discrepancy since that time .    Patient was seen previously and was given custom orthotics and has been trying to change her leg length discrepancy over the course of time. Patient was to be doing home exercises, icing protocol, and does take tramadol for breakthrough pain. Patient is doing the tramadol less and she is trying to take the gabapentin more. States that the gabapentin seems to be helping the leg discomfort and fatigue at the end of the day. Overall does think she is making some progress. She states that her job seems to be a little bit easier as well.    Past medical history, social, surgical and family history all reviewed in electronic medical record.   Review of Systems: No headache, visual changes, nausea, vomiting, diarrhea, constipation, dizziness, abdominal pain, skin rash, fevers, chills, night sweats, weight loss, swollen lymph nodes, , chest pain, shortness of breath, mood changes.   Objective Blood pressure 108/80, pulse 82, height 5' (1.524 m), weight 110 lb (49.896 kg), SpO2 96 %.  General: Patient is slurring her words and does have pinpoint pupils HEENT: Pinpoint pupils Respiratory: Patient's speak in full sentences and does not appear short of breath  Cardiovascular: No lower extremity edema, non tender, no erythema  Skin: Warm dry intact with no signs of infection or rash on extremities or on axial skeleton.  Abdomen: Soft  nontender  Neuro: Cranial nerves II through XII are intact, neurovascularly intact in all extremities with 2+ DTRs and 2+ pulses.  Lymph: No lymphadenopathy of posterior or anterior cervical chain or axillae bilaterally.  Gaitmild antalgic gait MSK:  Non tender with full range of motion and good stability and symmetric strength and tone of shoulders, elbows, wrist, , knee and ankles bilaterally.  Back exam shows that patient does have some mild pictures scoliosis of the lumbar spine. Patient does have a leg length discrepancy of a inch and a half on the left side being shorter. Patient's left-sided SI joint is tender to palpation.  Foot exam shows the patient does have overpronation of the right foot as well as oversupination of the hindfoot. Patient Mild bunion and bunionette formation of the feet bilaterally. nontender on exam today.   Impression and Recommendations:     This case required medical decision making of moderate complexity.    HVl

## 2016-06-05 NOTE — Patient Instructions (Addendum)
Good to see you  We will do Gabapentin 600mg  3 times a day max Continue the tramadol.  Keep it up and stay active Fixed the orthotics still should have another year.  See me again in 3 months.

## 2016-06-05 NOTE — Progress Notes (Signed)
Pre visit review using our clinic review tool, if applicable. No additional management support is needed unless otherwise documented below in the visit note. 

## 2016-07-06 ENCOUNTER — Telehealth: Payer: Self-pay | Admitting: *Deleted

## 2016-07-06 MED ORDER — TRAMADOL HCL 50 MG PO TABS: 100.0000 mg | ORAL_TABLET | Freq: Two times a day (BID) | ORAL | 3 refills | Status: DC | PRN

## 2016-07-06 NOTE — Telephone Encounter (Signed)
I am fine with refill. Thank you

## 2016-07-06 NOTE — Telephone Encounter (Signed)
Refill done.  

## 2016-07-06 NOTE — Telephone Encounter (Signed)
Refill request for Tramadol 50mg .

## 2016-08-04 ENCOUNTER — Telehealth: Payer: Self-pay | Admitting: *Deleted

## 2016-08-04 NOTE — Telephone Encounter (Signed)
Patient is in 06 recall for 06/2016. Please contact patient and schedule 6 month PAP Thanks [name]

## 2016-08-12 ENCOUNTER — Telehealth: Payer: Self-pay | Admitting: Internal Medicine

## 2016-08-12 NOTE — Telephone Encounter (Signed)
FYI: Patient called in stating she is having a lot of anxiety with things going on in her life.  I have scheduled her with Wilfred Lacy for tomorrow morning.

## 2016-08-13 ENCOUNTER — Encounter: Payer: Self-pay | Admitting: Nurse Practitioner

## 2016-08-13 ENCOUNTER — Ambulatory Visit (INDEPENDENT_AMBULATORY_CARE_PROVIDER_SITE_OTHER): Payer: Managed Care, Other (non HMO) | Admitting: Nurse Practitioner

## 2016-08-13 VITALS — BP 130/96 | HR 89 | Temp 98.3°F | Ht 60.0 in | Wt 112.5 lb

## 2016-08-13 DIAGNOSIS — F411 Generalized anxiety disorder: Secondary | ICD-10-CM | POA: Diagnosis not present

## 2016-08-13 MED ORDER — ALPRAZOLAM 0.25 MG PO TABS: 0.2500 mg | ORAL_TABLET | Freq: Two times a day (BID) | ORAL | 0 refills | Status: DC | PRN

## 2016-08-13 MED ORDER — VENLAFAXINE HCL ER 37.5 MG PO CP24: 37.5000 mg | ORAL_CAPSULE | Freq: Every day | ORAL | 0 refills | Status: DC

## 2016-08-13 NOTE — Progress Notes (Signed)
Pre visit review using our clinic review tool, if applicable. No additional management support is needed unless otherwise documented below in the visit note. 

## 2016-08-13 NOTE — Patient Instructions (Addendum)
Patient advised about the possible interaction between Effexor, gabapentin, tramadol, Xanax and Lamictal. She was also advised about possible sedation with medications. We discussed common side effects such as nausea, drowsiness and weight gain.  Also discussed rare but serious side effect of suicide ideation.  She is instructed to discontinue medication go directly to ED if this occurs.  Pt verbalizes understanding.   Plan follow up in 1week to evaluate progress.     Generalized Anxiety Disorder Generalized anxiety disorder (GAD) is a mental disorder. It interferes with life functions, including relationships, work, and school. GAD is different from normal anxiety, which everyone experiences at some point in their lives in response to specific life events and activities. Normal anxiety actually helps [place] prepare for and get through these life events and activities. Normal anxiety goes away after the event or activity is over.  GAD causes anxiety that is not necessarily related to specific events or activities. It also causes excess anxiety in proportion to specific events or activities. The anxiety associated with GAD is also difficult to control. GAD can vary from mild to severe. People with severe GAD can have intense waves of anxiety with physical symptoms (panic attacks).  SYMPTOMS The anxiety and worry associated with GAD are difficult to control. This anxiety and worry are related to many life events and activities and also occur more days than not for 6 months or longer. People with GAD also have three or more of the following symptoms (one or more in children):  Restlessness.   Fatigue.  Difficulty concentrating.   Irritability.  Muscle tension.  Difficulty sleeping or unsatisfying sleep. DIAGNOSIS GAD is diagnosed through an assessment by your health care provider. Your health care provider will ask you questions aboutyour mood,physical symptoms, and events in your life. Your  health care provider may ask you about your medical history and use of alcohol or drugs, including prescription medicines. Your health care provider may also do a physical exam and blood tests. Certain medical conditions and the use of certain substances can cause symptoms similar to those associated with GAD. Your health care provider may refer you to a mental health specialist for further evaluation. TREATMENT The following therapies are usually used to treat GAD:   Medication. Antidepressant medication usually is prescribed for long-term daily control. Antianxiety medicines may be added in severe cases, especially when panic attacks occur.   Talk therapy (psychotherapy). Certain types of talk therapy can be helpful in treating GAD by providing support, education, and guidance. A form of talk therapy called cognitive behavioral therapy can teach you healthy ways to think about and react to daily life events and activities.  Stress managementtechniques. These include yoga, meditation, and exercise and can be very helpful when they are practiced regularly. A mental health specialist can help determine which treatment is best for you. Some people see improvement with one therapy. However, other people require a combination of therapies.   This information is not intended to replace advice given to you by your health care provider. Make sure you discuss any questions you have with your health care provider.   Document Released: 03/13/2013 Document Revised: 12/07/2014 Document Reviewed: 03/13/2013 Elsevier Interactive Patient Education [employer].

## 2016-08-13 NOTE — Progress Notes (Signed)
Subjective:  Patient ID: Danielle Obrien, female    DOB: December 04, 1980  Age: 35 y.o. MRN: [id number]  CC: Panic Attack (family conflict.)   Anxiety  Presents for initial visit. Onset was 1 to 4 weeks ago. The problem has been gradually worsening. Symptoms include chest pain, depressed mood, excessive worry, feeling of choking, hyperventilation, muscle tension, nervous/anxious behavior, palpitations, panic and restlessness. Patient reports no shortness of breath or suicidal ideas. Symptoms occur constantly. The severity of symptoms is severe, causing significant distress and interfering with daily activities. The symptoms are aggravated by caffeine, family issues and work stress. The quality of sleep is poor. Nighttime awakenings: one to two.   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 Her past medical history is significant for anxiety/panic attacks, bipolar disorder and depression. There is no history of arrhythmia, CAD or suicide attempts. Past treatments include non-SSRI antidepressants (Current use of Lamictal until for bipolar disorder, has used trazodone in the past which was ineffective. Current use of gabapentin and tramadol for chronic hip pain). Compliance with prior treatments has been good.  Has contacted previous psychiatrist for counseling appointment.  Outpatient Medications Prior to Visit  Medication Sig Dispense Refill  . CALCIUM PO Take 2 tablets by mouth daily.    . Fluticasone-Salmeterol (ADVAIR) 250-50 MCG/DOSE AEPB Inhale 1 puff into the lungs 2 (two) times daily as needed (shortness of breath).    . gabapentin (NEURONTIN) 300 MG capsule Take 2 capsules (600 mg total) by mouth 3 (three) times daily. 360 capsule 1  . Lansoprazole (PREVACID PO) Take 1 tablet by mouth daily as needed (indigestion).    Marland Kitchen losartan (COZAAR) 50 MG tablet TAKE 1 TABLET BY MOUTH DAILY 30 tablet 3  . Multiple Vitamin (MULTIVITAMIN WITH MINERALS) TABS tablet Take 1 tablet by mouth daily.    . SUMAtriptan (IMITREX)  100 MG tablet Take 100 mg by mouth daily as needed for migraine. May repeat in 2 hours if headache persists or recurs.    Marland Kitchen tiZANidine (ZANAFLEX) 4 MG tablet Take 1 tablet (4 mg total) by mouth Nightly. 30 tablet 2  . traMADol (ULTRAM) 50 MG tablet Take 2 tablets (100 mg total) by mouth every 12 (twelve) hours as needed. 180 tablet 3  . LamoTRIgine 300 MG TB24 Take 1 tablet by mouth 2 (two) times daily.     No facility-administered medications prior to visit.     ROS See HPI  Objective:  BP (!) 130/96 (BP Location: Left Arm, Patient Position: Sitting, Cuff Size: Normal)   Pulse 89   Temp 98.3 F (36.8 C) (Oral)   Ht 5' (1.524 m)   Wt 112 lb 8 oz (51 kg)   SpO2 93%   BMI 21.97 kg/m   BP Readings from Last 3 Encounters:  08/13/16 (!) 130/96  06/05/16 108/80  03/23/16 106/64    Wt Readings from Last 3 Encounters:  08/13/16 112 lb 8 oz (51 kg)  06/05/16 110 lb (49.9 kg)  03/23/16 111 lb 6.4 oz (50.5 kg)    Physical Exam  Constitutional: She is oriented to person, place, and time. She appears distressed.  Neck: Normal range of motion. Neck supple.  Cardiovascular: Normal rate and regular rhythm.   Murmur heard. Pulmonary/Chest: Effort normal and breath sounds normal.  Musculoskeletal: She exhibits no edema.  Neurological: She is alert and oriented to person, place, and time.  Skin: Skin is warm and dry.  Psychiatric:  Tearful in exam room.  [place]. Tramadol prescribed by Dr.  Smith for hip pain. No other controlled substance prescription.  Lab Results  Component Value Date   WBC 5.4 01/20/2016   HGB 14.5 01/20/2016   HCT 42.7 01/20/2016   PLT 150 01/20/2016   GLUCOSE 87 01/20/2016   CHOL 247 (H) 01/14/2016   TRIG 83.0 01/14/2016   HDL 76.50 01/14/2016   LDLDIRECT 171.0 01/27/2013   LDLCALC 153 (H) 01/14/2016   ALT 19 01/14/2016   AST 19 01/14/2016   NA 137 01/20/2016   K 4.2 01/20/2016   CL 102 01/20/2016   CREATININE 0.88 01/20/2016   BUN 17  01/20/2016   CO2 28 01/20/2016   TSH 0.64 01/14/2016   INR 0.9 05/15/2014    No results found.  Assessment & Plan:   Danielle Obrien was seen today for panic attack.  Diagnoses and all orders for this visit:  Anxiety state -     venlafaxine XR (EFFEXOR XR) 37.5 MG 24 hr capsule; Take 1 capsule (37.5 mg total) by mouth daily with breakfast. -     ALPRAZolam (XANAX) 0.25 MG tablet; Take 1 tablet (0.25 mg total) by mouth 2 (two) times daily as needed for anxiety.   I am having Danielle Obrien start on venlafaxine XR and ALPRAZolam. I am also having her maintain her CALCIUM PO, Fluticasone-Salmeterol, SUMAtriptan, Lansoprazole (PREVACID PO), multivitamin with minerals, tiZANidine, losartan, gabapentin, traMADol, and lamoTRIgine.  Meds ordered this encounter  Medications  . lamoTRIgine (LAMICTAL) 200 MG tablet    Sig: Take 200 mg by mouth 2 (two) times daily.  Marland Kitchen venlafaxine XR (EFFEXOR XR) 37.5 MG 24 hr capsule    Sig: Take 1 capsule (37.5 mg total) by mouth daily with breakfast.    Dispense:  7 capsule    Refill:  0    Order Specific Question:   Supervising Provider    Answer:   Cassandria Anger [[id number]]  . ALPRAZolam (XANAX) 0.25 MG tablet    Sig: Take 1 tablet (0.25 mg total) by mouth 2 (two) times daily as needed for anxiety.    Dispense:  10 tablet    Refill:  0    Order Specific Question:   Supervising Provider    Answer:   Cassandria Anger [[id number]]    Follow-up: Return in about 1 week (around 08/20/2016) for anxiety.  Wilfred Lacy, NP

## 2016-08-13 NOTE — Telephone Encounter (Signed)
Closing note and forwarding to PCP for FYI.

## 2016-08-19 ENCOUNTER — Other Ambulatory Visit: Payer: Self-pay | Admitting: Cardiology

## 2016-08-20 ENCOUNTER — Encounter: Payer: Self-pay | Admitting: Nurse Practitioner

## 2016-08-20 ENCOUNTER — Ambulatory Visit (INDEPENDENT_AMBULATORY_CARE_PROVIDER_SITE_OTHER): Payer: Managed Care, Other (non HMO) | Admitting: Nurse Practitioner

## 2016-08-20 ENCOUNTER — Other Ambulatory Visit (INDEPENDENT_AMBULATORY_CARE_PROVIDER_SITE_OTHER): Payer: Managed Care, Other (non HMO)

## 2016-08-20 VITALS — BP 128/90 | HR 88 | Temp 98.4°F | Ht 60.0 in | Wt 110.0 lb

## 2016-08-20 DIAGNOSIS — E785 Hyperlipidemia, unspecified: Secondary | ICD-10-CM | POA: Diagnosis not present

## 2016-08-20 DIAGNOSIS — F411 Generalized anxiety disorder: Secondary | ICD-10-CM

## 2016-08-20 LAB — LIPID PANEL
CHOLESTEROL: 254 mg/dL — AB (ref 0–200)
HDL: 79 mg/dL (ref >39.00)
LDL CALC: 162 mg/dL — AB (ref 0–99)
NonHDL: 175.07
TRIGLYCERIDES: 67 mg/dL (ref 0.0–149.0)
Total CHOL/HDL Ratio: 3
VLDL: 13.4 mg/dL (ref 0.0–40.0)

## 2016-08-20 MED ORDER — VENLAFAXINE HCL ER 75 MG PO CP24: 75.0000 mg | ORAL_CAPSULE | Freq: Every day | ORAL | 2 refills | Status: DC

## 2016-08-20 MED ORDER — ALPRAZOLAM 0.25 MG PO TABS: 0.2500 mg | ORAL_TABLET | Freq: Two times a day (BID) | ORAL | 0 refills | Status: DC | PRN

## 2016-08-20 NOTE — Progress Notes (Signed)
Subjective:  Patient ID: Danielle Obrien, female    DOB: 09/29/1981  Age: 35 y.o. MRN: [id number]  CC: Panic Attack (Pt stated the Rx. Xanax is helping but Rx Effexor just just taking 1 tab daily)   HPI  Anxiety: She reports mild improvement of symptoms with xanax and effexor. She is taking xanax half tab in morning wAnd 1 tab at bedtime. She uses Xanax to call with high stress environment  at work. 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 She denies any homicidal or suicidal ideation. She denies any impulsive behaviors, no hallucinations. Has appointment with psychologist today.  Bipolar symptoms managed with Lamictal.  Hyperlipidermia: Repeat lipid panel.  Murmur: Denies any symptoms today: Chest pain, palpitations, shortness of breath, edema and PND. Upcoming appointment with cardiologist October 26th. Notation from Dr. Radford Pax (cardiologist) from 02/2016 office vist. 1. Aortic stenosis - moderate by cath and moderate to severe by echo. I will repeat an echo 08/2016 to evaluate for progression.  I have told her to call me if she has any CP, SOB, edema, dizziness or syncope as this may be an indication that the AS is progressing.   2. Bicuspid AV.  Influenza vaccine: Denies immunization today.  Outpatient Medications Prior to Visit  Medication Sig Dispense Refill  . CALCIUM PO Take 2 tablets by mouth daily.    . Fluticasone-Salmeterol (ADVAIR) 250-50 MCG/DOSE AEPB Inhale 1 puff into the lungs 2 (two) times daily as needed (shortness of breath).    . gabapentin (NEURONTIN) 300 MG capsule Take 2 capsules (600 mg total) by mouth 3 (three) times daily. 360 capsule 1  . lamoTRIgine (LAMICTAL) 200 MG tablet Take 200 mg by mouth 2 (two) times daily.    . Lansoprazole (PREVACID PO) Take 1 tablet by mouth daily as needed (indigestion).    Marland Kitchen losartan (COZAAR) 50 MG tablet TAKE 1 TABLET BY MOUTH DAILY 30 tablet 6  . Multiple Vitamin (MULTIVITAMIN WITH MINERALS) TABS  tablet Take 1 tablet by mouth daily.    . SUMAtriptan (IMITREX) 100 MG tablet Take 100 mg by mouth daily as needed for migraine. May repeat in 2 hours if headache persists or recurs.    . traMADol (ULTRAM) 50 MG tablet Take 2 tablets (100 mg total) by mouth every 12 (twelve) hours as needed. 180 tablet 3  . ALPRAZolam (XANAX) 0.25 MG tablet Take 1 tablet (0.25 mg total) by mouth 2 (two) times daily as needed for anxiety. 10 tablet 0  . venlafaxine XR (EFFEXOR XR) 37.5 MG 24 hr capsule Take 1 capsule (37.5 mg total) by mouth daily with breakfast. 7 capsule 0  . tiZANidine (ZANAFLEX) 4 MG tablet Take 1 tablet (4 mg total) by mouth Nightly. (Patient not taking: Reported on 08/20/2016) 30 tablet 2   No facility-administered medications prior to visit.     ROS See HPI  Objective:  BP 128/90 (BP Location: Left Arm, Patient Position: Sitting, Cuff Size: Normal)   Pulse 88   Temp 98.4 F (36.9 C)   Ht 5' (1.524 m)   Wt 110 lb 0.6 oz (49.9 kg)   SpO2 98%   BMI 21.49 kg/m   BP Readings from Last 3 Encounters:  08/20/16 128/90  08/13/16 (!) 130/96  06/05/16 108/80    Wt Readings from Last 3 Encounters:  08/20/16 110 lb 0.6 oz (49.9 kg)  08/13/16 112 lb 8 oz (51 kg)  06/05/16 110 lb (49.9 kg)    Physical Exam  Constitutional:  She is oriented to person, place, and time. No distress.  Neck: Normal range of motion. Neck supple. No thyromegaly present.  Cardiovascular: Normal rate and regular rhythm.   Murmur heard. Systolic murmur auscultated throughout the chest wall.  Pulmonary/Chest: Effort normal and breath sounds normal.  Lymphadenopathy:    She has no cervical adenopathy.  Neurological: She is alert and oriented to person, place, and time.  Skin: Skin is warm and dry.  Psychiatric: She has a normal mood and affect. Her behavior is normal.  Vitals reviewed.   Lab Results  Component Value Date   WBC 5.4 01/20/2016   HGB 14.5 01/20/2016   HCT 42.7 01/20/2016   PLT 150  01/20/2016   GLUCOSE 87 01/20/2016   CHOL 247 (H) 01/14/2016   TRIG 83.0 01/14/2016   HDL 76.50 01/14/2016   LDLDIRECT 171.0 01/27/2013   LDLCALC 153 (H) 01/14/2016   ALT 19 01/14/2016   AST 19 01/14/2016   NA 137 01/20/2016   K 4.2 01/20/2016   CL 102 01/20/2016   CREATININE 0.88 01/20/2016   BUN 17 01/20/2016   CO2 28 01/20/2016   TSH 0.64 01/14/2016   INR 0.9 05/15/2014    Assessment & Plan:   Aricia was seen today for panic attack.  Diagnoses and all orders for this visit:  Anxiety state -     ALPRAZolam (XANAX) 0.25 MG tablet; Take 1 tablet (0.25 mg total) by mouth 2 (two) times daily as needed for anxiety. -     venlafaxine XR (EFFEXOR-XR) 75 MG 24 hr capsule; Take 1 capsule (75 mg total) by mouth daily with breakfast.  Hyperlipidemia -     Lipid Profile; Future  Other orders -     Cancel: venlafaxine (EFFEXOR) 50 MG tablet; Take 1 tablet (50 mg total) by mouth 2 (two) times daily.   I have discontinued Ms. Kelch's venlafaxine XR. I am also having her start on venlafaxine XR. Additionally, I am having her maintain her CALCIUM PO, Fluticasone-Salmeterol, SUMAtriptan, Lansoprazole (PREVACID PO), multivitamin with minerals, tiZANidine, gabapentin, traMADol, lamoTRIgine, losartan, and ALPRAZolam.  Meds ordered this encounter  Medications  . ALPRAZolam (XANAX) 0.25 MG tablet    Sig: Take 1 tablet (0.25 mg total) by mouth 2 (two) times daily as needed for anxiety.    Dispense:  5 tablet    Refill:  0    Order Specific Question:   Supervising Provider    Answer:   Cassandria Anger [[id number]]  . venlafaxine XR (EFFEXOR-XR) 75 MG 24 hr capsule    Sig: Take 1 capsule (75 mg total) by mouth daily with breakfast.    Dispense:  30 capsule    Refill:  2    Order Specific Question:   Supervising Provider    Answer:   Cassandria Anger [[id number]]    Follow-up: Return in about 3 months (around 11/19/2016) for anxiety, hyperlipidermia.  Wilfred Lacy, NP

## 2016-08-20 NOTE — Patient Instructions (Addendum)
Maintain appt with psychology. Bring copy of psychology report during next visit. Patient made aware that Xanax prescription will not be refilled. F/up in 29months Go to basement for lipid panel

## 2016-08-20 NOTE — Progress Notes (Signed)
Pre visit review using our clinic review tool, if applicable. No additional management support is needed unless otherwise documented below in the visit note. 

## 2016-08-20 NOTE — Assessment & Plan Note (Signed)
Upcoming appointment with cardiologist October 26th. Notation from Dr. Radford Pax (cardiologist) on 02/2016 office vist. 1. Aortic stenosis - moderate by cath and moderate to severe by echo. I will repeat an echo 08/2016 to evaluate for progression.  I have told her to call me if she has any CP, SOB, edema, dizziness or syncope as this may be an indication that the AS is progressing.   2. Bicuspid AV.

## 2016-08-20 NOTE — Assessment & Plan Note (Signed)
Has upcoming appointment with cardiologist: September 26th. Denies any symptoms at this time.

## 2016-08-20 NOTE — Telephone Encounter (Signed)
Sending to Estill Bamberg just realized message was not routed when created -eh

## 2016-08-24 ENCOUNTER — Telehealth: Payer: Self-pay

## 2016-08-24 ENCOUNTER — Other Ambulatory Visit: Payer: Self-pay | Admitting: Nurse Practitioner

## 2016-08-24 DIAGNOSIS — F411 Generalized anxiety disorder: Secondary | ICD-10-CM | POA: Insufficient documentation

## 2016-08-24 DIAGNOSIS — F3177 Bipolar disorder, in partial remission, most recent episode mixed: Secondary | ICD-10-CM

## 2016-08-24 MED ORDER — BUSPIRONE HCL 15 MG PO TABS: 15.0000 mg | ORAL_TABLET | Freq: Three times a day (TID) | ORAL | 1 refills | Status: DC

## 2016-08-24 NOTE — Telephone Encounter (Signed)
Routing to charlotte---Patient was trying to tolerate new med "effexor" but continues to have diarrhea continually or gas and has stopped this medicine---is there a replacement for this med without side effect?---please advise, I will call her back

## 2016-08-24 NOTE — Telephone Encounter (Signed)
I already recd permission to leave message on this phone per patient---I have left message with charlottes instructions---can talk with tamara if any questions

## 2016-08-24 NOTE — Telephone Encounter (Signed)
Typically recommend tapering off Effexor in order to avoid withdrawal symptoms. Since she stopped medication, if she experience any withdrawal symptoms she needs to go to the emergency room.  Effects or can be replaced with BuSpar, but cannot guarantee 0 side effects. Prescription sent. I am also entering in order for psychiatry referral. Continue with psychology counseling at this time.

## 2016-08-24 NOTE — Telephone Encounter (Signed)
Patient called back about her lab results. She also had some questions about one of the medications Baldo Ash had put her on. Please follow up with patient. Thank you.

## 2016-08-31 NOTE — Telephone Encounter (Signed)
Called patient regarding scheduling 33month pap, no answer, left message to call back

## 2016-09-01 ENCOUNTER — Encounter: Payer: Self-pay | Admitting: Nurse Practitioner

## 2016-09-18 ENCOUNTER — Ambulatory Visit (HOSPITAL_COMMUNITY): Payer: Managed Care, Other (non HMO) | Attending: Cardiology

## 2016-09-18 DIAGNOSIS — Q231 Congenital insufficiency of aortic valve: Secondary | ICD-10-CM | POA: Diagnosis not present

## 2016-09-18 DIAGNOSIS — E785 Hyperlipidemia, unspecified: Secondary | ICD-10-CM | POA: Diagnosis not present

## 2016-09-18 DIAGNOSIS — I10 Essential (primary) hypertension: Secondary | ICD-10-CM | POA: Diagnosis not present

## 2016-09-18 DIAGNOSIS — I350 Nonrheumatic aortic (valve) stenosis: Secondary | ICD-10-CM | POA: Diagnosis not present

## 2016-09-18 DIAGNOSIS — Z720 Tobacco use: Secondary | ICD-10-CM | POA: Diagnosis not present

## 2016-09-18 DIAGNOSIS — I359 Nonrheumatic aortic valve disorder, unspecified: Secondary | ICD-10-CM | POA: Diagnosis present

## 2016-09-21 ENCOUNTER — Other Ambulatory Visit: Payer: Self-pay | Admitting: Family Medicine

## 2016-09-21 NOTE — Telephone Encounter (Signed)
Refill done.  

## 2016-09-24 ENCOUNTER — Telehealth: Payer: Self-pay | Admitting: Cardiology

## 2016-09-24 DIAGNOSIS — I350 Nonrheumatic aortic (valve) stenosis: Secondary | ICD-10-CM

## 2016-09-24 NOTE — Telephone Encounter (Signed)
Spoke with pt . Echo results were given. Pt said that she experience some SOB. She states works 8 to 9 hours at day  at [employer], and she is constantly moving. She is tired when she gets home due to have worked   the long hours. Pt denies chest pain, or exertion fatigue or dizziness. Pt is aware that Dr Radford Pax may want to repeat the ETT. Pt said that she is under a lot stress lately, she asked if that have something to do with the echo results. Pt is aware that I will send this message to  MD for reviewing.

## 2016-09-24 NOTE — Telephone Encounter (Signed)
New Message ° °Pt voiced returning nurses call. °

## 2016-09-25 NOTE — Telephone Encounter (Signed)
Left message to call back  

## 2016-09-25 NOTE — Telephone Encounter (Signed)
F/u message ° °Pt returning RN call. Please call back to discuss  °

## 2016-09-25 NOTE — Telephone Encounter (Signed)
Please get ETT to assess for hypotensive BP response and exercise capacity

## 2016-09-25 NOTE — Telephone Encounter (Signed)
Follow Up ° ° ° ° °Pt is returning call from earlier. Please call. °

## 2016-09-28 NOTE — Telephone Encounter (Signed)
Patient agrees to GXT and test ordered for scheduling. She understands to hold food and drink for 4 hours prior to test and to come dressed prepared to exercise including close toed shoes. She was grateful for call.

## 2016-09-30 NOTE — Telephone Encounter (Signed)
GXT has been scheduled 11/2.

## 2016-10-01 ENCOUNTER — Ambulatory Visit (INDEPENDENT_AMBULATORY_CARE_PROVIDER_SITE_OTHER): Payer: Managed Care, Other (non HMO)

## 2016-10-01 DIAGNOSIS — I350 Nonrheumatic aortic (valve) stenosis: Secondary | ICD-10-CM | POA: Diagnosis not present

## 2016-10-01 LAB — EXERCISE TOLERANCE TEST
CHL CUP MPHR: 88 {beats}/min
CHL CUP RESTING HR STRESS: 85 {beats}/min
CHL CUP STRESS STAGE 1 SPEED: 0 mph
CHL CUP STRESS STAGE 10 DBP: 71 mmHg
CHL CUP STRESS STAGE 10 GRADE: 0 %
CHL CUP STRESS STAGE 2 GRADE: 0 %
CHL CUP STRESS STAGE 2 SPEED: 0 mph
CHL CUP STRESS STAGE 3 HR: 106 {beats}/min
CHL CUP STRESS STAGE 5 GRADE: 10 %
CHL CUP STRESS STAGE 5 HR: 134 {beats}/min
CHL CUP STRESS STAGE 5 SPEED: 1.7 mph
CHL CUP STRESS STAGE 6 DBP: 63 mmHg
CHL CUP STRESS STAGE 6 GRADE: 12 %
CHL CUP STRESS STAGE 6 SBP: 94 mmHg
CHL CUP STRESS STAGE 7 SBP: 105 mmHg
CHL CUP STRESS STAGE 7 SPEED: 3.4 mph
CHL CUP STRESS STAGE 8 GRADE: 16 %
CHL CUP STRESS STAGE 9 GRADE: 0 %
CHL RATE OF PERCEIVED EXERTION: 14
CSEPED: 10 min
CSEPEDS: 0 s
CSEPEW: 11.7 METS
CSEPHR: 88 %
CSEPPHR: 162 {beats}/min
Percent of predicted max HR: 88 %
Stage 1 DBP: 80 mmHg
Stage 1 Grade: 0 %
Stage 1 HR: 94 {beats}/min
Stage 1 SBP: 106 mmHg
Stage 10 HR: 118 {beats}/min
Stage 10 SBP: 95 mmHg
Stage 10 Speed: 0 mph
Stage 2 HR: 104 {beats}/min
Stage 3 Grade: 0 %
Stage 3 Speed: 1 mph
Stage 4 Grade: 0 %
Stage 4 HR: 105 {beats}/min
Stage 4 Speed: 1 mph
Stage 5 DBP: 67 mmHg
Stage 5 SBP: 108 mmHg
Stage 6 HR: 148 {beats}/min
Stage 6 Speed: 2.5 mph
Stage 7 DBP: 64 mmHg
Stage 7 Grade: 14 %
Stage 7 HR: 155 {beats}/min
Stage 8 HR: 162 {beats}/min
Stage 8 Speed: 4.2 mph
Stage 9 DBP: 57 mmHg
Stage 9 HR: 146 {beats}/min
Stage 9 SBP: 75 mmHg
Stage 9 Speed: 0 mph

## 2016-10-05 NOTE — Telephone Encounter (Signed)
Patient has not returned call regarding 06 recall- Please advise on recall status  Thanks [name]

## 2016-10-06 ENCOUNTER — Encounter: Payer: Self-pay | Admitting: *Deleted

## 2016-10-06 NOTE — Telephone Encounter (Signed)
Patient needs letter from me recommending follow up. Thank you for preparing this letter.

## 2016-10-06 NOTE — Telephone Encounter (Signed)
Letter printed. Patient removed from recall -eh

## 2016-10-07 ENCOUNTER — Encounter: Payer: Self-pay | Admitting: *Deleted

## 2016-10-08 ENCOUNTER — Telehealth: Payer: Self-pay | Admitting: Cardiology

## 2016-10-08 DIAGNOSIS — I350 Nonrheumatic aortic (valve) stenosis: Secondary | ICD-10-CM

## 2016-10-08 NOTE — Telephone Encounter (Signed)
-----   Message from Sueanne Margarita, MD sent at 10/06/2016  3:09 PM EST ----- Please let patient know that BP dropped significantly in recovery which can indicate hemodynamic changes due to severe AS.  Please refer to Dr. Ricard Dillon for further evaluation

## 2016-10-08 NOTE — Telephone Encounter (Signed)
Informed patient of results and verbal understanding expressed.   Referral to Dr. Roxy Manns placed for scheduling. Patient agrees with treatment plan.

## 2016-10-08 NOTE — Telephone Encounter (Signed)
Follow Up:     Returning your call from Tuesday.

## 2016-10-12 ENCOUNTER — Institutional Professional Consult (permissible substitution) (INDEPENDENT_AMBULATORY_CARE_PROVIDER_SITE_OTHER): Payer: Managed Care, Other (non HMO) | Admitting: Thoracic Surgery (Cardiothoracic Vascular Surgery)

## 2016-10-12 ENCOUNTER — Encounter: Payer: Self-pay | Admitting: Thoracic Surgery (Cardiothoracic Vascular Surgery)

## 2016-10-12 VITALS — BP 105/71 | HR 97 | Resp 20 | Ht 60.0 in | Wt 112.0 lb

## 2016-10-12 DIAGNOSIS — Q231 Congenital insufficiency of aortic valve: Secondary | ICD-10-CM | POA: Diagnosis not present

## 2016-10-12 DIAGNOSIS — I359 Nonrheumatic aortic valve disorder, unspecified: Secondary | ICD-10-CM

## 2016-10-12 DIAGNOSIS — Q230 Congenital stenosis of aortic valve: Secondary | ICD-10-CM | POA: Diagnosis not present

## 2016-10-12 NOTE — Progress Notes (Addendum)
[address]       [place] [address]             [phone]     CARDIOTHORACIC SURGERY CONSULTATION REPORT  Referring Provider is Sueanne Margarita, MD PCP is [name], MD  Chief Complaint  Patient presents with  . Aortic Stenosis    Surgical eval, ECHO 09/18/16, Stress Test 10/01/16    HPI:  Patient is a 35 year old female with long-standing history of bicuspid aortic valve with aortic stenosis, bipolar disorder with chronic anxiety, hypertension, tobacco abuse, and chronic arthritis in both hips and both knees who has been referred for surgical consultation to discuss treatment options for management of aortic stenosis. The patient states that she was first noted to have a heart murmur during childhood and she has been followed for many years by Dr. Radford Pax. Previous echocardiograms have documented the presence of bicuspid aortic valve with at least moderate if not severe aortic stenosis and normal left ventricular systolic function.  Echocardiogram performed 02/20/2014 suggested the presence of severe aortic stenosis with peak velocity across the aortic valve reported 4.4 m/s corresponding to mean transvalvular gradient estimated 49 mmHg.  Shortly after that the patient underwent left and right heart catheterization on 05/18/2014 by Dr. Irish Lack.  By report there was "no gradient between the left ventricle and aorta" but aortic valve area was calculated 1.3 cm. The patient had no significant coronary artery disease and normal pulmonary artery pressures.   The patient claimed to remain be asymptomatic and was treated medically.  She was recently seen in follow-up by Dr. Radford Pax and repeat echocardiogram revealed similar findings with peak velocity across the aortic valve reported 3.9 m/s corresponding to mean transvalvular gradient estimated 39 mmHg. Left ventricular systolic function remained normal.  The patient subsequently underwent an exercise treadmill stress  test.  The patient was able to exercise following a standard Bruce protocol and achieved 80% of maximum heart rate after 8.3 minutes. There was no ST segment deviation during stress but the patient's blood pressure dropped. She was subsequently referred for elective surgical consultation.  The patient is single and lives locally in [place] with her fianc. She works in the Theme park manager at [employer]. She has a long history of tobacco abuse and currently smokes one half pack cigarettes daily. She denies any problems with symptoms of exertional shortness of breath other than the fact that she gets short of breath with activity when her allergies are flaring up. She has occasional tightness across her chest that is typically associated with stress and anxiety and not related to physical activity. She states that she is physically active and she reports no significant change in her exercise tolerance. She specifically denies any exertional chest discomfort, resting shortness of breath, PND, orthopnea, dizzy spells, or syncope. Her physical mobility is slightly limited because of chronic pain and weakness related to arthritis involving primarily her left hip in both knees.  However she spends her entire working days on her feet and usually has little difficulty. She admits that she suffers from chronic anxiety for which she sees a therapist and a psychiatrist. She states that her mother is scheduled for complex back surgery within the next week.  Past Medical History:  Diagnosis Date  . Abnormal Pap smear of cervix    --recurrent ascus w/Pos. HR HPV  . ADD (attention deficit disorder)   . Alcohol abuse, in remission 2012  . Allergy   .  Anxiety   . Aortic stenosis   . Arthritis    In hips  . Asthma    rare inhaler use  . Bicuspid aortic valve    moderate AS by echo 08/2015 with mean AVG 78mmHg and AVA 1.1cm2  . Bipolar disorder ([place])   . Headache   . Hyperlipidemia with target LDL  less than 130 01/27/2013  . Hypertension   . Muscle spasms of both lower extremities    both hips  . Tobacco abuse 09/27/2015  . 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 ABDOMINAL HYSTERECTOMY  02/06/2009   Robotic total laparoscopic hysterectomy  . ANTERIOR CRUCIATE LIGAMENT REPAIR  1993  . CARDIAC CATHETERIZATION  June 2015   no CAD - moderate AS noted  . 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 COLPOSCOPY  10/2008   CIN I & II  . COLPOSCOPY  07/2000   Neg. ECC  . COLPOSCOPY  06/2001   CIN I  . COLPOSCOPY  08/2004   ECC--atypia  . COLPOSCOPY N/A 01/21/2016   Procedure: COLPOSCOPY with vaginal biopsy with CO 2 Laser of Vaginal and vulvar condyloma;  Surgeon: [name], MD;  Location: [facility];  Service: Gynecology;  Laterality: N/A;  [name] will be here 2/21 for 1115 case confirmed 01/16/15 - TS  . HERNIA REPAIR  1981/1982  . HIP SURGERY  1981   Hip Reset   . HIP SURGERY  1990   Plate was reconstructed/ took out growth plate in Right knee  . HIP SURGERY  1992   Plate removed in Left hip  . LEFT AND RIGHT HEART CATHETERIZATION WITH CORONARY ANGIOGRAM N/A 05/18/2014   Procedure: LEFT AND RIGHT HEART CATHETERIZATION WITH CORONARY ANGIOGRAM;  Surgeon: Jettie Booze, MD;  Location: [facility] CATH LAB;  Service: Cardiovascular;  Laterality: N/A;  . LYMPH NODE BIOPSY  1995  . NASAL SEPTUM SURGERY  2002  . TONSILLECTOMY AND ADENOIDECTOMY  1990  . TYMPANOSTOMY TUBE PLACEMENT  1981/1982    Family History  Problem Relation Age of Onset  . Hyperlipidemia Mother   . Hypertension Mother   . Thyroid disease Mother   . Hyperlipidemia Father   . Hypertension Father   . Migraines Father   . Hypertension Brother   . Hyperlipidemia Brother   . Thyroid disease Maternal Grandmother   . Thyroid disease Maternal Grandfather   .  Cancer Neg Hx     Social History   Social History  . Marital status: Single    Spouse name: N/A  . Number of children: N/A  . Years of education: N/A   Occupational History  . Not on file.   Social History Main Topics  . Smoking status: Current Every Day Smoker    Packs/day: 0.25    Years: 12.00    Types: Cigarettes  . Smokeless tobacco: Never Used  . Alcohol use No  . Drug use: No  . Sexual activity: Yes    Partners: Male    Birth control/ protection: Surgical     Comment: Hysterectomy   Other Topics Concern  . Not on file   Social History Narrative  . No narrative on file    Current Outpatient Prescriptions  Medication Sig Dispense Refill  . ALPRAZolam (XANAX) 0.25 MG tablet Take 1 tablet (0.25 mg total) by  mouth 2 (two) times daily as needed for anxiety. 5 tablet 0  . busPIRone (BUSPAR) 15 MG tablet Take 1 tablet (15 mg total) by mouth 3 (three) times daily. 90 tablet 1  . CALCIUM PO Take 2 tablets by mouth daily.    . Fluticasone-Salmeterol (ADVAIR) 250-50 MCG/DOSE AEPB Inhale 1 puff into the lungs 2 (two) times daily as needed (shortness of breath).    . gabapentin (NEURONTIN) 300 MG capsule TAKE 2 CAPSULES BY MOUTH THREE TIMES A DAY 360 capsule 0  . lamoTRIgine (LAMICTAL) 200 MG tablet Take 200 mg by mouth 2 (two) times daily.    . Lansoprazole (PREVACID PO) Take 1 tablet by mouth daily as needed (indigestion).    Marland Kitchen losartan (COZAAR) 50 MG tablet TAKE 1 TABLET BY MOUTH DAILY 30 tablet 6  . Multiple Vitamin (MULTIVITAMIN WITH MINERALS) TABS tablet Take 1 tablet by mouth daily.    . SUMAtriptan (IMITREX) 100 MG tablet Take 100 mg by mouth daily as needed for migraine. May repeat in 2 hours if headache persists or recurs.    Marland Kitchen tiZANidine (ZANAFLEX) 4 MG tablet Take 1 tablet (4 mg total) by mouth Nightly. 30 tablet 2  . traMADol (ULTRAM) 50 MG tablet Take 2 tablets (100 mg total) by mouth every 12 (twelve) hours as needed. 180 tablet 3   No current  facility-administered medications for this visit.     Allergies  Allergen Reactions  . Demerol Nausea And Vomiting  . Ibuprofen     Gastritis   . Morphine And Related Nausea And Vomiting  . Codeine Itching      Review of Systems:   General:  normal appetite, normal energy, no weight gain, no weight loss, no fever  Cardiac:  no chest pain with exertion, no chest pain at rest, + SOB with exertion but "only while allergies are acting up", no resting SOB, no PND, no orthopnea, no palpitations, no arrhythmia, no atrial fibrillation, no LE edema, no dizzy spells, no syncope  Respiratory:  no shortness of breath, no home oxygen, no productive cough, no dry cough, occasional bronchitis, no wheezing, no hemoptysis, no asthma, no pain with inspiration or cough, no sleep apnea, no CPAP at night  GI:   no difficulty swallowing, no reflux, no frequent heartburn, no hiatal hernia, no abdominal pain, no constipation, no diarrhea, no hematochezia, no hematemesis, no melena  GU:   no dysuria,  no frequency, no urinary tract infection, no hematuria, no enlarged prostate, no kidney stones, no kidney disease  Vascular:  no pain suggestive of claudication, no pain in feet, no leg cramps, no varicose veins, no DVT, no non-healing foot ulcer  Neuro:   no stroke, no TIA's, no seizures, no headaches, no temporary blindness one eye,  no slurred speech, no peripheral neuropathy, mild chronic pain, no instability of gait, no memory/cognitive dysfunction  Musculoskeletal: + arthritis, no joint swelling, + myalgias, no significant difficulty walking, normal mobility   Skin:   no rash, no itching, no skin infections, no pressure sores or ulcerations  Psych:   + anxiety, + depression, + nervousness, + unusual recent stress  Eyes:   no blurry vision, no floaters, no recent vision changes, does not wears glasses or contacts  ENT:   + hearing loss, no loose or painful teeth, no dentures, last saw dentist  09/24/2016  Hematologic:  no easy bruising, no abnormal bleeding, no clotting disorder, no frequent epistaxis  Endocrine:  no diabetes, does not check CBG's at home  Physical Exam:   BP 105/71 (BP Location: Right Arm, Patient Position: Sitting, Cuff Size: Normal)   Pulse 97   Resp 20   Ht 5' (1.524 m)   Wt 112 lb (50.8 kg)   SpO2 96% Comment: RA  BMI 21.87 kg/m   General:  Thin,  well-appearing  HEENT:  Unremarkable   Neck:   no JVD, no bruits, no adenopathy   Chest:   clear to auscultation, symmetrical breath sounds, no wheezes, no rhonchi   CV:   RRR, grade IV/VI crescendo/decrescendo systolic murmur   Abdomen:  soft, non-tender, no masses   Extremities:  warm, well-perfused, pulses palpable, no LE edema  Rectal/GU  Deferred  Neuro:   Grossly non-focal and symmetrical throughout  Skin:   Clean and dry, no rashes, no breakdown   Diagnostic Tests:  Transthoracic Echocardiography  Patient:  Danielle Obrien, Danielle Obrien MR #:    [id number] Study Date: 02/20/2014 Gender:   F Age:    29 Height:   152.4cm Weight:   54kg BSA:    1.53m^2 Pt. Status: Room:  ATTENDING  Dicky Doe, Janetta Hora SONOGRAPHER McFatter, Mandy PERFORMING  [facility], Outpatient cc:  ------------------------------------------------------------ LV EF: 60% -  65%  ------------------------------------------------------------ Indications:   Biscuspid aortic valve 746.4. Murmur 785.2.  ------------------------------------------------------------ History:  PMH: Acquired from the patient and from the patient's chart. Murmur. Risk factors: Hypertension.  ------------------------------------------------------------ Study Conclusions  - Left ventricle: The cavity size was normal. Wall thickness was normal. Systolic function was normal. The estimated ejection fraction was in the range of 60% to 65%. Wall motion was normal;  there were no regional wall motion abnormalities. Left ventricular diastolic function parameters were normal. - Aortic valve: Functionally bicuspid; moderately calcified leaflets. Valve mobility was restricted. There was severe stenosis. Valve area: 0.94cm^2(VTI). Valve area: 0.9cm^2 (Vmax). - Pulmonary arteries: Systolic pressure was mildly increased. Impressions:  - Normal LV function; calcified, functionally bicuspid aortic valve with severe AS. Transthoracic echocardiography. M-mode, complete 2D, spectral Doppler, and color Doppler. Height: Height: 152.4cm. Height: 60in. Weight: Weight: 54kg. Weight: 118.8lb. Body mass index: BMI: 23.2kg/m^2. Body surface area:  BSA: 1.84m^2. Blood pressure:   110/70. Patient status: Outpatient. Location: [facility]  ------------------------------------------------------------  ------------------------------------------------------------ Left ventricle: The cavity size was normal. Wall thickness was normal. Systolic function was normal. The estimated ejection fraction was in the range of 60% to 65%. Wall motion was normal; there were no regional wall motion abnormalities. The transmitral flow pattern was normal. The deceleration time of the early transmitral flow velocity was normal. The pulmonary vein flow pattern was normal. The tissue Doppler parameters were normal. Left ventricular diastolic function parameters were normal.  ------------------------------------------------------------ Aortic valve:  Functionally bicuspid; moderately calcified leaflets. Valve mobility was restricted. Doppler:  There was severe stenosis.  No regurgitation.  VTI ratio of LVOT to aortic valve: 0.25. Valve area: 0.94cm^2(VTI). Indexed valve area: 0.63cm^2/m^2 (VTI). Peak velocity ratio of LVOT to aortic valve: 0.24. Valve area: 0.9cm^2 (Vmax). Indexed valve area: 0.6cm^2/m^2 (Vmax).  Mean gradient: 12mm Hg  (S). Peak gradient: 16mm Hg (S).  ------------------------------------------------------------ Aorta: Aortic root: The aortic root was normal in size.  ------------------------------------------------------------ Mitral valve:  Structurally normal valve.  Mobility was not restricted. Doppler: Transvalvular velocity was within the normal range. There was no evidence for stenosis. Trivial regurgitation.  Peak gradient: 13mm Hg (D).  ------------------------------------------------------------ Left atrium: The atrium was normal in size.  ------------------------------------------------------------ Right ventricle: The cavity size was normal.  Systolic function was normal.  ------------------------------------------------------------ Pulmonic valve:  Doppler: Transvalvular velocity was within the normal range. There was no evidence for stenosis. Trivial regurgitation.  ------------------------------------------------------------ Tricuspid valve:  Structurally normal valve.  Doppler: Transvalvular velocity was within the normal range. Trivial regurgitation.  ------------------------------------------------------------ Pulmonary artery:  Systolic pressure was mildly increased.  ------------------------------------------------------------ Right atrium: The atrium was normal in size.  ------------------------------------------------------------ Pericardium: There was no pericardial effusion.  ------------------------------------------------------------ Systemic veins: Inferior vena cava: The vessel was normal in size.  ------------------------------------------------------------  2D measurements    Normal Doppler measurements  Normal Left ventricle         Main pulmonary LVID ED,  45.4 mm   43-52  artery chord,             Pressure,  30 mm Hg =30 PLAX              S LVID ES,  26.9 mm   23-38  Pressure   6  mm Hg ------ chord,             ED PLAX              Left ventricle FS, chord,  41 %   >29   Ea, lat  13.5 cm/s  ------ PLAX              ann, tiss LVPW, ED  8.78 mm   ------ DP IVS/LVPW  0.98    <1.3  E/Ea, lat  7.7    ------ ratio, ED           ann, tiss Ventricular septum       DP IVS, ED  8.64 mm   ------ Ea, med  9.21 cm/s  ------ LVOT              ann, tiss Diam, S   22 mm   ------ DP Area    3.8 cm^2  ------ E/Ea, med 11.2    ------ Diam     22 mm   ------ ann, tiss   9 Aorta             DP Root diam,  30 mm   ------ LVOT ED               Peak vel,  104 cm/s  ------ Left atrium          S AP dim    31 mm   ------ VTI, S   24.2 cm   ------ AP dim   2.07 cm/m^2 <2.2  Stroke vol  92 ml   ------ index             Stroke   61.3 ml/m^2 ------                index                Aortic valve                Peak vel,  438 cm/s  ------                S                Mean vel,  307 cm/s  ------                S                VTI, S   97.8 cm   ------  Mean     49 mm Hg ------                gradient,                S                Peak     77 mm Hg ------                gradient,                S                VTI ratio 0.25    ------                LVOT/AV                Area, VTI 0.94 cm^2  ------                Area index 0.63 cm^2/m ------                (VTI)      ^2                Peak vel  0.24    ------                 ratio,                LVOT/AV                Area, Vmax 0.9 cm^2  ------                Area index 0.6 cm^2/m ------                (Vmax)     ^2                Mitral valve                Peak E vel 104 cm/s  ------                Peak A vel 65.4 cm/s  ------                Decelerati 141 ms   150-23                on time        0                Peak     4 mm Hg ------                gradient,                D                Peak E/A  1.6    ------                ratio                Max regurg 368 cm/s  ------                vel                Tricuspid valve                Regurg   261 cm/s  ------  peak vel                Peak RV-RA  27 mm Hg ------                gradient,                S                Systemic veins                Estimated   3 mm Hg ------                CVP                Right ventricle                Pressure,  30 mm Hg <30                S                Sa vel,  10.9 cm/s  ------                lat ann,                tiss DP                Pulmonic valve                Regurg   88.2 cm/s  ------                vel, ED  ------------------------------------------------------------ Prepared and Electronically Authenticated by  Kirk Ruths 2015-03-24T09:01:[id number]    CARDIAC CATHETERIZATION  PROCEDURE:  Right heart cath; Left  heart catheterization with selective coronary angiography, left ventriculogram.  INDICATIONS:  Aortic stenosis  The risks, benefits, and details of the procedure were explained to the patient.  The patient verbalized understanding and wanted to proceed.  Informed written consent was obtained.  PROCEDURE TECHNIQUE:  After Xylocaine anesthesia a 44F sheath was placed in the right femoral artery with a single anterior needle wall stick.  A 7Fr sheath was placed in the right femoral vein. Left coronary angiography was done using a Judkins L4 guide catheter, and JL 3.5.  Right coronary angiography was done using a Judkins R4 guide catheter.  Swan-Ganz catheter was advanced for hemodynamic assessment, under fluoroscopic guidance. Pulmonary artery and aortic saturations were obtained. We tried to cross the aortic valve with a pigtail catheter, a JR 4 catheter but were unsuccessful. We tried with a straight wire and the JR 4 catheter. This is unsuccessful. An a.l. 1 catheter was exchanged and with a J-wire, the aortic valve was crossed. Simultaneous right femoral artery and left ventricular pressure was obtained. Left ventriculography was done using a pigtail catheter.    CONTRAST:  Total of 100 cc.  COMPLICATIONS:  None.    HEMODYNAMICS:  Aortic pressure was 98/66; LV pressure was 131/5; LVEDP 12.  There was no gradient between the left ventricle and aorta.  Right atrial pressure 9 over a, mean right atrial pressure 6 mmHg; RV pressure 20 for her to come RBBB 7 mmHg pulmonary artery pressure 22/9, mean pulmonary artery pressure 14 mmHg; pulmonary capillary wedge pressure 11/14, mean pulmonary capillary wedge pressure 10 mmHg; pulmonary artery saturation 80%, aortic saturation 99%. Cardiac output 5.29 L per minute; cardiac index 3.6. Aortic valve area 1.3 cm.  ANGIOGRAPHIC DATA:   The left main  coronary artery is either very short, or absent. It seems more likely that it is absent.  The left anterior  descending artery is a large vessel proximally. There is a large diagonal vessel which is widely patent. The apical LAD is fairly small and just reaches the apex. There is no atherosclerosis in the LAD system..  The left circumflex artery is a large, codominant vessel. There is a large, branching obtuse marginal which appears widely patent. There are 2 small obtuse marginals coming off of the proximal circumflex. The left PDA is widely patent.  The right coronary artery is a medium-sized, co dominant vessel. There is significant catheter-induced spasm in the proximal vessel. This improved with intra-arterial nitroglycerin.Marland Kitchen  LEFT VENTRICULOGRAM:  Left ventricular angiogram was done in the 30 RAO projection and revealed normal left ventricular wall motion and systolic function with an estimated ejection fraction of 60 %.  LVEDP was 12 mmHg.  IMPRESSIONS:  1. 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 2. Normal left anterior descending artery and its branches. 3. Normal left circumflex artery and its branches. 4. Normal right coronary artery. 5. Normal left ventricular systolic function.  LVEDP 12 mmHg.  Ejection fraction 60 %.  Cardiac output 5.29 L per minute, cardiac index 3.6. No pulmonary hypertension. Moderate aortic stenosis with valve area 1.3 cm.  RECOMMENDATION:  She'll followup with Dr. Radford Pax. Further decisions regarding aortic gout intervention will be made at that time. She'll be watched in the holding area for several hours.   Transthoracic Echocardiography  Patient:    Chaisty, Sagers MR #:       [id number] Study Date: 09/18/2016 Gender:     F Age:        68 Height:     152.4 cm Weight:     49.9 kg BSA:        1.46 m^2 Pt. Status: Room:   ORDERING     Fransico Him, MD  REFERRING    Fransico Him, MD  SONOGRAPHER  Cindy Hazy, RDCS  ATTENDING    Ena Dawley, M.D.  PERFORMING   [facility],  Outpatient  cc:  -------------------------------------------------------------------  ------------------------------------------------------------------- Indications:      I35.9 Aortic Valve Disorder.  ------------------------------------------------------------------- History:   PMH:  Acquired from the patient and from the patient&'s chart.  PMH:  Bicuspid Aortic Valve.  Risk factors:  Current tobacco use. Hypertension. Dyslipidemia.  ------------------------------------------------------------------- Study Conclusions  - Left ventricle: The cavity size was normal. Systolic function was   normal. Wall motion was normal; there were no regional wall   motion abnormalities. Left ventricular diastolic function   parameters were normal. - Aortic valve: Bicuspid; severely thickened, severely calcified   leaflets. There was severe stenosis. Mean gradient (S): 39 mm Hg.   Peak gradient (S): 60 mm Hg. - Aortic root: The aortic root was normal in size. - Left atrium: The atrium was normal in size. - Right ventricle: The cavity size was normal. Wall thickness was   normal. Systolic function was normal. - Right atrium: The atrium was normal in size. - Tricuspid valve: There was no regurgitation. - Pulmonary arteries: Systolic pressure could not be accurately   estimated. - Inferior vena cava: The vessel was normal in size. - Pericardium, extracardiac: There was no pericardial effusion.  Impressions:  - When compared to the prior echocardiogram from 09/24/2015 aortic   stenosis is now severe.  ------------------------------------------------------------------- Study data:   Study status:  Routine.  Procedure:  The  patient reported no pain pre or post test. Transthoracic echocardiography for left ventricular function evaluation, for right ventricular function evaluation, and for assessment of valvular function. Image quality was adequate.  Study completion:  There were  no complications.          Transthoracic echocardiography.  M-mode, complete 2D, spectral Doppler, and color Doppler.  Birthdate: Patient birthdate: [birth year].  Age:  Patient is 35 yr old.  Sex: Gender: female.    BMI: 21.5 kg/m^2.  Patient status:  Outpatient. Study date:  Study date: 09/18/2016. Study time: 08:41 AM. Location:  [facility]  -------------------------------------------------------------------  ------------------------------------------------------------------- Left ventricle:  The cavity size was normal. Systolic function was normal. Wall motion was normal; there were no regional wall motion abnormalities. The transmitral flow pattern was normal. The deceleration time of the early transmitral flow velocity was normal. The pulmonary vein flow pattern was normal. The tissue Doppler parameters were normal. Left ventricular diastolic function parameters were normal.  ------------------------------------------------------------------- Aortic valve:   Bicuspid; severely thickened, severely calcified leaflets. Mobility was not restricted.  Doppler:   There was severe stenosis.   There was no regurgitation.    VTI ratio of LVOT to aortic valve: 0.32. Valve area (VTI): 1.01 cm^2. Indexed valve area (VTI): 0.69 cm^2/m^2. Peak velocity ratio of LVOT to aortic valve: 0.29. Valve area (Vmax): 0.9 cm^2. Indexed valve area (Vmax): 0.62 cm^2/m^2. Mean velocity ratio of LVOT to aortic valve: 0.28. Valve area (Vmean): 0.88 cm^2. Indexed valve area (Vmean): 0.61 cm^2/m^2.    Mean gradient (S): 39 mm Hg. Peak gradient (S): 60 mm Hg.  ------------------------------------------------------------------- Aorta:  Aortic root: The aortic root was normal in size.  ------------------------------------------------------------------- Mitral valve:   Structurally normal valve.   Mobility was not restricted.  Doppler:  Transvalvular velocity was within the normal range. There was no  evidence for stenosis. There was no regurgitation.    Peak gradient (D): 6 mm Hg.  ------------------------------------------------------------------- Left atrium:  The atrium was normal in size.  ------------------------------------------------------------------- Right ventricle:  The cavity size was normal. Wall thickness was normal. Systolic function was normal.  ------------------------------------------------------------------- Pulmonic valve:    Structurally normal valve.   Cusp separation was normal.  Doppler:  Transvalvular velocity was within the normal range. There was no evidence for stenosis. There was no regurgitation.  ------------------------------------------------------------------- Tricuspid valve:   Structurally normal valve.    Doppler: Transvalvular velocity was within the normal range. There was no regurgitation.  ------------------------------------------------------------------- Pulmonary artery:   The main pulmonary artery was normal-sized. Systolic pressure could not be accurately estimated.  ------------------------------------------------------------------- Right atrium:  The atrium was normal in size.  ------------------------------------------------------------------- Pericardium:  There was no pericardial effusion.  ------------------------------------------------------------------- Systemic veins: Inferior vena cava: The vessel was normal in size.  ------------------------------------------------------------------- Measurements   Left ventricle                            Value          Reference  LV ID, ED, PLAX chordal           (L)     41.4  mm       43 - 52  LV ID, ES, PLAX chordal           (L)     21.8  mm       23 - 38  LV fx shortening, PLAX chordal  47    %        >=29  LV PW thickness, ED                       10.9  mm       ---------  IVS/LV PW ratio, ED                       1              <=1.3  Stroke volume,  2D                         93    ml       ---------  Stroke volume/bsa, 2D                     64    ml/m^2   ---------  LV e&', lateral                            10.5  cm/s     ---------  LV E/e&', lateral                          12             ---------  LV e&', medial                             9.39  cm/s     ---------  LV E/e&', medial                           13.42          ---------  LV e&', average                            9.95  cm/s     ---------  LV E/e&', average                          12.67          ---------    Ventricular septum                        Value          Reference  IVS thickness, ED                         10.9  mm       ---------    LVOT                                      Value          Reference  LVOT ID, S                                20    mm       ---------  LVOT area  3.14  cm^2     ---------  LVOT ID                                   20    mm       ---------  LVOT peak velocity, S                     111   cm/s     ---------  LVOT mean velocity, S                     85.8  cm/s     ---------  LVOT VTI, S                               29.6  cm       ---------  LVOT peak gradient, S                     5     mm Hg    ---------  Stroke volume (SV), LVOT DP               93    ml       ---------  Stroke index (SV/bsa), LVOT DP            63.8  ml/m^2   ---------    Aortic valve                              Value          Reference  Aortic valve peak velocity, S             386   cm/s     ---------  Aortic valve mean velocity, S             305   cm/s     ---------  Aortic valve VTI, S                       92    cm       ---------  Aortic mean gradient, S                   39    mm Hg    ---------  Aortic peak gradient, S                   60    mm Hg    ---------  VTI ratio, LVOT/AV                        0.32           ---------  Aortic valve area, VTI                    1.01  cm^2     ---------  Aortic valve  area/bsa, VTI                0.69  cm^2/m^2 ---------  Velocity ratio, peak, LVOT/AV             0.29           ---------  Aortic valve area, peak velocity  0.9   cm^2     ---------  Aortic valve area/bsa, peak               0.62  cm^2/m^2 ---------  velocity  Velocity ratio, mean, LVOT/AV             0.28           ---------  Aortic valve area, mean velocity          0.88  cm^2     ---------  Aortic valve area/bsa, mean               0.61  cm^2/m^2 ---------  velocity    Aorta                                     Value          Reference  Aortic root ID, ED                        30    mm       ---------  Ascending aorta ID, A-P, S                34    mm       ---------    Left atrium                               Value          Reference  LA ID, A-P, ES                            32    mm       ---------  LA ID/bsa, A-P                            2.2   cm/m^2   <=2.2  LA volume, S                              33    ml       ---------  LA volume/bsa, S                          22.6  ml/m^2   ---------  LA volume, ES, 1-p A4C                    27    ml       ---------  LA volume/bsa, ES, 1-p A4C                18.5  ml/m^2   ---------  LA volume, ES, 1-p A2C                    41    ml       ---------  LA volume/bsa, ES, 1-p A2C                28.1  ml/m^2   ---------    Mitral valve  Value          Reference  Mitral E-wave peak velocity               126   cm/s     ---------  Mitral A-wave peak velocity               80.8  cm/s     ---------  Mitral deceleration time          (H)     236   ms       150 - 230  Mitral peak gradient, D                   6     mm Hg    ---------  Mitral E/A ratio, peak                    1.6            ---------    Right ventricle                           Value          Reference  RV s&', lateral, S                         15.6  cm/s     ---------  Legend: (L)  and  (H)  mark values outside specified reference  range.  ------------------------------------------------------------------- Prepared and Electronically Authenticated by  Ena Dawley, M.D. 2017-10-20T10:34:18    EXERCISE STRESS TEST   Blood pressure demonstrated a hypotensive response to exercise.  There was no ST segment deviation noted during stress.  No T wave inversion was noted during stress.   Relatively low BP at baseline and no increase in BP with exercise. Otherwise, normal ECG stress test.   Stress Findings   ECG Baseline ECG exhibits normal sinus rhythm.Baseline ECG indicates non-specific ST-T wave changes. .    Stress Findings The patient exercised following the Bruce protocol.  The patient reported no symptoms during the stress test. The patient experienced no angina during the stress test.   The patient requested the test to be stopped.   Heart rate demonstrated a normal response to exercise. Blood pressure demonstrated a hypotensive response to exercise. Overall, the patient's exercise capacity was excellent.   85% of maximum heart rate was achieved after 8.3 minutes. Recovery time: 5 minutes. The patient's response to exercise was adequate for diagnosis.    Response to Stress There was no ST segment deviation noted during stress.  No T wave inversion was noted during stress. There were no significant arrhythmias noted during the test.  ECG was interpretable and there was no significant change from baseline.    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  I have personally reviewed the patient's recent transthoracic echocardiogram and the diagnostic cardiac catheterization performed in 2015. The aortic valve appears to be congenitally bicuspid with severe thickening, calcification, and severely restricted leaflet mobility involving both leaflets of the valve.  Peak velocity across the aortic valve has measured greater than 4 m/s on previous echocardiograms, and measured 3.9 m/s on the most recent echo.  Left ventricular systolic function remains normal.  Although the patient claims to remain to be asymptomatic, she did not do well on her recent exercise treadmill test with documented fall in blood pressure during recovery phase.  Previous cardiac catheterization was notable for the absence of significant coronary artery disease. I agree it would be reasonable to consider proceeding with elective aortic valve replacement in the near future.   Plan:  The patient was counseled at length regarding treatment alternatives for management of severe aortic stenosis including continued medical therapy versus proceeding with aortic valve replacement in the near future.  The natural history of aortic stenosis was reviewed, as was long term prognosis with medical therapy alone.  Surgical options were discussed at length including conventional surgical aortic valve replacement through either a full median sternotomy or using minimally invasive techniques.  Other alternatives including rapid-deployment bioprosthetic tissue valve replacement, transcatheter aortic valve replacement, patch enlargement of the aortic root, stentless porcine aortic root replacement, valve repair, the Ross autograft procedure, and homograft aortic root replacement were briefly discussed.  Discussion was held comparing the relative risks of mechanical valve replacement with need for lifelong anticoagulation versus use of a bioprosthetic tissue  valve and the associated potential for late structural valve deterioration and failure.    The patient is not interested in proceeding with elective aortic valve replacement in the near future. She states that she understands that she will need to have surgery eventually, but this time she is primarily concerned with the fact that her mother is scheduled to undergo complex spine surgery in the near future. I recommend that the patient increase her frequency of surveillance such that she should be seen in follow-up every 3-6 months. We discussed the importance of onset of symptoms and the rationale for early surgical intervention. The patient will contact Dr. Theodosia Blender office to schedule follow-up appointment within 3 months. We will plan to see her back in 6 months or sooner should she change her mind.  The patient has been strongly advised to find a way to quit smoking completely.    I spent in excess of 90 minutes during the conduct of this office consultation and >50% of this time involved direct face-to-face encounter with the patient for counseling and/or coordination of their care.    Valentina Gu. Roxy Manns, MD 10/12/2016 4:44 PM

## 2016-10-12 NOTE — Progress Notes (Signed)
Please repeat echo and ETT in 6 months and followup with me after that

## 2016-10-12 NOTE — Patient Instructions (Signed)
Continue all previous medications without any changes at this time  Stop smoking immediately and permanently.  

## 2016-11-04 ENCOUNTER — Telehealth: Payer: Self-pay | Admitting: *Deleted

## 2016-11-04 DIAGNOSIS — F411 Generalized anxiety disorder: Secondary | ICD-10-CM

## 2016-11-04 NOTE — Telephone Encounter (Signed)
Pt left msg on triage stating she has made an appt for 11/16/16, but she is currently out of her buspar. Requesting to have refill until appt...[name]

## 2016-11-04 NOTE — Telephone Encounter (Signed)
She is on effexor and not buspar. Please clarify which medication she is referring to. Thank you

## 2016-11-05 MED ORDER — BUSPIRONE HCL 15 MG PO TABS: 15.0000 mg | ORAL_TABLET | Freq: Three times a day (TID) | ORAL | 3 refills | Status: DC

## 2016-11-05 NOTE — Telephone Encounter (Signed)
Ok, I see medication change done through a telephone encounter. Please send Buspar 15mg  TID, 90tabs, with 3refills. Thank you

## 2016-11-05 NOTE — Telephone Encounter (Signed)
Called pt no answer LMOM w/charlotte response. Rx sent to [facility]...[name]

## 2016-11-05 NOTE — Telephone Encounter (Signed)
Notified pt w/Danielle Obrien response. Pt states she had stopped the effexor due to side effects "diarrhea" that she was having, and was giving the Buspar. She has been taking buspar...[name]

## 2016-11-16 ENCOUNTER — Other Ambulatory Visit: Payer: Self-pay | Admitting: Family Medicine

## 2016-11-16 ENCOUNTER — Ambulatory Visit: Payer: Managed Care, Other (non HMO) | Admitting: Nurse Practitioner

## 2016-11-16 NOTE — Telephone Encounter (Signed)
Refill done.  

## 2016-12-21 ENCOUNTER — Ambulatory Visit (INDEPENDENT_AMBULATORY_CARE_PROVIDER_SITE_OTHER): Payer: Managed Care, Other (non HMO) | Admitting: Nurse Practitioner

## 2016-12-21 ENCOUNTER — Ambulatory Visit (INDEPENDENT_AMBULATORY_CARE_PROVIDER_SITE_OTHER)
Admission: RE | Admit: 2016-12-21 | Discharge: 2016-12-21 | Disposition: A | Discharge status: Home / Self Care | Payer: Managed Care, Other (non HMO) | Source: Ambulatory Visit | Attending: Nurse Practitioner | Admitting: Nurse Practitioner

## 2016-12-21 ENCOUNTER — Encounter: Payer: Self-pay | Admitting: Nurse Practitioner

## 2016-12-21 VITALS — BP 96/58 | HR 94 | Temp 97.9°F | Ht 60.0 in | Wt 109.0 lb

## 2016-12-21 DIAGNOSIS — J440 Chronic obstructive pulmonary disease with acute lower respiratory infection: Secondary | ICD-10-CM

## 2016-12-21 DIAGNOSIS — J209 Acute bronchitis, unspecified: Secondary | ICD-10-CM

## 2016-12-21 DIAGNOSIS — Z720 Tobacco use: Secondary | ICD-10-CM | POA: Diagnosis not present

## 2016-12-21 MED ORDER — PROMETHAZINE-DM 6.25-15 MG/5ML PO SYRP: 5.0000 mL | ORAL_SOLUTION | Freq: Three times a day (TID) | ORAL | 0 refills | Status: DC | PRN

## 2016-12-21 MED ORDER — FLUTICASONE PROPIONATE 50 MCG/ACT NA SUSP: 2.0000 | Freq: Every day | NASAL | 0 refills | Status: DC

## 2016-12-21 MED ORDER — ALBUTEROL SULFATE HFA 108 (90 BASE) MCG/ACT IN AERS: 2.0000 | INHALATION_SPRAY | Freq: Four times a day (QID) | RESPIRATORY_TRACT | 0 refills | Status: DC | PRN

## 2016-12-21 MED ORDER — PREDNISONE 10 MG (21) PO TBPK: 10.0000 mg | ORAL_TABLET | ORAL | 0 refills | Status: DC

## 2016-12-21 MED ORDER — GUAIFENESIN ER 600 MG PO TB12: 600.0000 mg | ORAL_TABLET | Freq: Two times a day (BID) | ORAL | 0 refills | Status: DC | PRN

## 2016-12-21 NOTE — Patient Instructions (Addendum)
No acute finding on CXR, hence no oral abx needed at this time.  URI Instructions: Flonase and Afrin use: apply 1spray of afrin in each nare, wait 66mins, then apply 2sprays of flonase in each nare. Use both nasal spray consecutively x 3days, then flonase only for at least 14days.  Encourage adequate oral hydration.  Use over-the-counter  "cold" medicines  such as "Tylenol cold" , "Advil cold",  "Mucinex" or" Mucinex D"  for cough and congestion.  Avoid decongestants if you have high blood pressure. Use" Delsym" or" Robitussin" cough syrup varietis for cough.  You can use plain "Tylenol" or "Advi"l for fever, chills and achyness.  You will be called with CXR results.

## 2016-12-21 NOTE — Progress Notes (Signed)
Subjective:  Patient ID: Danielle Obrien, female    DOB: [birth year]  Age: 36 y.o. MRN: [id number]  CC: Cough (coughing up green/brown,congestion,headache,bodyache for going on 3 days. had allergy for 2 wks. took nyquil/ and sudafed. )   Cough  This is a new problem. The current episode started in the past 7 days. The problem has been waxing and waning. The cough is productive of purulent sputum. Associated symptoms include chest pain, chills, headaches, nasal congestion, postnasal drip, rhinorrhea, a sore throat, shortness of breath and wheezing. The symptoms are aggravated by lying down and cold air. Risk factors for lung disease include smoking/tobacco exposure. She has tried OTC cough suppressant for the symptoms. The treatment provided mild relief. Her past medical history is significant for bronchitis, COPD and environmental allergies.    Outpatient Medications Prior to Visit  Medication Sig Dispense Refill  . ALPRAZolam (XANAX) 0.25 MG tablet Take 1 tablet (0.25 mg total) by mouth 2 (two) times daily as needed for anxiety. 5 tablet 0  . busPIRone (BUSPAR) 15 MG tablet Take 1 tablet (15 mg total) by mouth 3 (three) times daily. 90 tablet 3  . CALCIUM PO Take 2 tablets by mouth daily.    . Fluticasone-Salmeterol (ADVAIR) 250-50 MCG/DOSE AEPB Inhale 1 puff into the lungs 2 (two) times daily as needed (shortness of breath).    . gabapentin (NEURONTIN) 300 MG capsule TAKE 2 CAPSULES BY MOUTH THREE TIMES A DAY 360 capsule 0  . lamoTRIgine (LAMICTAL) 200 MG tablet Take 200 mg by mouth 2 (two) times daily.    . Lansoprazole (PREVACID PO) Take 1 tablet by mouth daily as needed (indigestion).    Marland Kitchen losartan (COZAAR) 50 MG tablet TAKE 1 TABLET BY MOUTH DAILY 30 tablet 6  . Multiple Vitamin (MULTIVITAMIN WITH MINERALS) TABS tablet Take 1 tablet by mouth daily.    . traMADol (ULTRAM) 50 MG tablet Take 2 tablets (100 mg total) by mouth every 12 (twelve) hours as needed. 180 tablet 3  . SUMAtriptan  (IMITREX) 100 MG tablet Take 100 mg by mouth daily as needed for migraine. May repeat in 2 hours if headache persists or recurs.    Marland Kitchen tiZANidine (ZANAFLEX) 4 MG tablet Take 1 tablet (4 mg total) by mouth Nightly. (Patient not taking: Reported on 12/21/2016) 30 tablet 2   No facility-administered medications prior to visit.     ROS See HPI  Objective:  BP (!) 96/58   Pulse 94   Temp 97.9 F (36.6 C)   Ht 5' (1.524 m)   Wt 109 lb (49.4 kg)   SpO2 99%   BMI 21.29 kg/m   BP Readings from Last 3 Encounters:  12/21/16 (!) 96/58  10/12/16 105/71  08/20/16 128/90    Wt Readings from Last 3 Encounters:  12/21/16 109 lb (49.4 kg)  10/12/16 112 lb (50.8 kg)  08/20/16 110 lb 0.6 oz (49.9 kg)    Physical Exam  Constitutional: She is oriented to person, place, and time.  HENT:  Right Ear: Tympanic membrane, external ear and ear canal normal.  Left Ear: Tympanic membrane, external ear and ear canal normal.  Nose: Mucosal edema and rhinorrhea present. Right sinus exhibits maxillary sinus tenderness. Right sinus exhibits no frontal sinus tenderness. Left sinus exhibits maxillary sinus tenderness. Left sinus exhibits no frontal sinus tenderness.  Mouth/Throat: Uvula is midline. No trismus in the jaw. Posterior oropharyngeal erythema present. No oropharyngeal exudate.  Eyes: No scleral icterus.  Neck: Normal range of motion.  Neck supple.  Cardiovascular: Normal rate and regular rhythm.   Murmur heard. Pulmonary/Chest: Effort normal and breath sounds normal.  Musculoskeletal: She exhibits no edema.  Lymphadenopathy:    She has no cervical adenopathy.  Neurological: She is alert and oriented to person, place, and time.  Vitals reviewed.   Lab Results  Component Value Date   WBC 5.4 01/20/2016   HGB 14.5 01/20/2016   HCT 42.7 01/20/2016   PLT 150 01/20/2016   GLUCOSE 87 01/20/2016   CHOL 254 (H) 08/20/2016   TRIG 67.0 08/20/2016   HDL 79.00 08/20/2016   LDLDIRECT 171.0  01/27/2013   LDLCALC 162 (H) 08/20/2016   ALT 19 01/14/2016   AST 19 01/14/2016   NA 137 01/20/2016   K 4.2 01/20/2016   CL 102 01/20/2016   CREATININE 0.88 01/20/2016   BUN 17 01/20/2016   CO2 28 01/20/2016   TSH 0.64 01/14/2016   INR 0.9 05/15/2014    No results found.  Assessment & Plan:   Arren was seen today for cough.  Diagnoses and all orders for this visit:  COPD (chronic obstructive pulmonary disease) with acute bronchitis ([place]) -     DG Chest 2 View; Future -     promethazine-dextromethorphan (PROMETHAZINE-DM) 6.25-15 MG/5ML syrup; Take 5 mLs by mouth 3 (three) times daily as needed for cough. -     guaiFENesin (MUCINEX) 600 MG 12 hr tablet; Take 1 tablet (600 mg total) by mouth 2 (two) times daily as needed for cough or to loosen phlegm. -     fluticasone (FLONASE) 50 MCG/ACT nasal spray; Place 2 sprays into both nostrils daily. -     albuterol (PROVENTIL HFA;VENTOLIN HFA) 108 (90 Base) MCG/ACT inhaler; Inhale 2 puffs into the lungs every 6 (six) hours as needed for wheezing or shortness of breath. -     predniSONE (STERAPRED UNI-PAK 21 TAB) 10 MG (21) TBPK tablet; Take 1 tablet (10 mg total) by mouth as directed.  Tobacco abuse -     DG Chest 2 View; Future -     promethazine-dextromethorphan (PROMETHAZINE-DM) 6.25-15 MG/5ML syrup; Take 5 mLs by mouth 3 (three) times daily as needed for cough. -     guaiFENesin (MUCINEX) 600 MG 12 hr tablet; Take 1 tablet (600 mg total) by mouth 2 (two) times daily as needed for cough or to loosen phlegm. -     fluticasone (FLONASE) 50 MCG/ACT nasal spray; Place 2 sprays into both nostrils daily.   I have discontinued Ms. Wisner's tiZANidine. I am also having her start on promethazine-dextromethorphan, guaiFENesin, fluticasone, albuterol, and predniSONE. Additionally, I am having her maintain her CALCIUM PO, Fluticasone-Salmeterol, SUMAtriptan, Lansoprazole (PREVACID PO), multivitamin with minerals, traMADol, lamoTRIgine, losartan,  ALPRAZolam, busPIRone, and gabapentin.  Meds ordered this encounter  Medications  . promethazine-dextromethorphan (PROMETHAZINE-DM) 6.25-15 MG/5ML syrup    Sig: Take 5 mLs by mouth 3 (three) times daily as needed for cough.    Dispense:  240 mL    Refill:  0    Order Specific Question:   Supervising Provider    Answer:   Cassandria Anger [[id number]]  . guaiFENesin (MUCINEX) 600 MG 12 hr tablet    Sig: Take 1 tablet (600 mg total) by mouth 2 (two) times daily as needed for cough or to loosen phlegm.    Dispense:  14 tablet    Refill:  0    Order Specific Question:   Supervising Provider    Answer:   Cassandria Anger [[id number]]  .  fluticasone (FLONASE) 50 MCG/ACT nasal spray    Sig: Place 2 sprays into both nostrils daily.    Dispense:  16 g    Refill:  0    Order Specific Question:   Supervising Provider    Answer:   Cassandria Anger [[id number]]  . albuterol (PROVENTIL HFA;VENTOLIN HFA) 108 (90 Base) MCG/ACT inhaler    Sig: Inhale 2 puffs into the lungs every 6 (six) hours as needed for wheezing or shortness of breath.    Dispense:  1 Inhaler    Refill:  0    Order Specific Question:   Supervising Provider    Answer:   Cassandria Anger [[id number]]  . predniSONE (STERAPRED UNI-PAK 21 TAB) 10 MG (21) TBPK tablet    Sig: Take 1 tablet (10 mg total) by mouth as directed.    Dispense:  21 tablet    Refill:  0    Order Specific Question:   Supervising Provider    Answer:   Cassandria Anger [[id number]]    Follow-up: Return if symptoms worsen or fail to improve.  Wilfred Lacy, NP

## 2016-12-21 NOTE — Progress Notes (Signed)
Pre visit review using our clinic review tool, if applicable. No additional management support is needed unless otherwise documented below in the visit note. 

## 2016-12-28 ENCOUNTER — Other Ambulatory Visit: Payer: Self-pay | Admitting: Family Medicine

## 2016-12-29 ENCOUNTER — Telehealth: Payer: Self-pay | Admitting: Emergency Medicine

## 2016-12-29 MED ORDER — TRAMADOL HCL 50 MG PO TABS: 100.0000 mg | ORAL_TABLET | Freq: Two times a day (BID) | ORAL | 0 refills | Status: DC | PRN

## 2016-12-29 NOTE — Telephone Encounter (Signed)
Per dr Tamala Julian, okay to send rx to pharmacy since pt made an appt.

## 2016-12-29 NOTE — Telephone Encounter (Signed)
Pt called and wants to know if she can get a prescription refill on her traMADol (ULTRAM) 50 MG tablet. Please advise thanks.

## 2017-01-11 ENCOUNTER — Other Ambulatory Visit: Payer: Self-pay | Admitting: Family Medicine

## 2017-01-11 NOTE — Telephone Encounter (Signed)
Refill done.  

## 2017-01-11 NOTE — Progress Notes (Signed)
  [name] [address] [place], [place] [address] Phone: [phone] Subjective:     CC: Left hip pain follow-upBack pain follow-up  [id number]  Danielle Obrien is a 36 y.o. female coming in with complaint of left hip pain. Patient does give a past medical history significant for hip surgery on multiple occasions. 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 Patient needed plating which has been removed as well with the last surgery in 1992. Patient has had a leg length discrepancy since that time .    Update 01/12/2017 - patient has not been seen in quite some time. States that she has been doing relatively well. Continues to take tramadol 2 times daily. Taking gabapentin 3 times a day. States that this is been allowing her to do any activity. Patient states that she is not having as much chronic pain. Is able to continue to work on a regular basis and has not called out on multiple months. Patient denies any radiation of pain. Denies any numbness. States that she is felt quite this good in quite some time.    Past medical history, social, surgical and family history all reviewed in electronic medical record.   Review of Systems: No headache, visual changes, nausea, vomiting, diarrhea, constipation, dizziness, abdominal pain, skin rash, fevers, chills, night sweats, weight loss, swollen lymph nodes, chest pain, shortness of breath, mood changes.  Positive for muscle aches and body aches.  Objective  Blood pressure 120/88, pulse 72, height 5' (1.524 m), weight 117 lb 12.8 oz (53.4 kg).  Systems examined below as of 01/12/17 General: NAD A&O x3 mood, affect normal  HEENT: Pupils equal, extraocular movements intact no nystagmus Respiratory: not short of breath at rest or with speaking Cardiovascular: No lower extremity edema, non tender Skin: Warm dry intact with no signs of infection or rash on extremities or on axial  skeleton. Abdomen: Soft nontender, no masses Neuro: Cranial nerves  intact, neurovascularly intact in all extremities with 2+ DTRs and 2+ pulses. Lymph: No lymphadenopathy appreciated today  Gait normal with good balance and coordination.  MSK: Non tender with full range of motion and good stability and symmetric strength and tone of shoulders, elbows, wrist,  knee hips and ankles bilaterally.   Back Exam:  Inspection: Unremarkable  Motion: Flexion 35 deg, Extension 25 deg, Side Bending to 30 deg bilaterally,  Rotation to 25 deg bilaterally  SLR laying: Negative  XSLR laying: Negative  Palpable tenderness: Tender to palpation in the paraspinal musculature of the lumbar spine left side greater than right. FABER: Positive left. Sensory change: Gross sensation intact to all lumbar and sacral dermatomes.  Reflexes: 2+ at both patellar tendons, 2+ at achilles tendons, Babinski's downgoing.  Strength at foot  4 out of 5 strength but symmetric.   Patient is a overpronation of the hindfoot bilaterally. Patient does have some splaying between the first and second toes with breakdown of the transverse arch. Nontender on exam today.   Impression and Recommendations:     This case required medical decision making of moderate complexity.    HVl

## 2017-01-12 ENCOUNTER — Ambulatory Visit (INDEPENDENT_AMBULATORY_CARE_PROVIDER_SITE_OTHER): Payer: Managed Care, Other (non HMO) | Admitting: Family Medicine

## 2017-01-12 ENCOUNTER — Encounter: Payer: Self-pay | Admitting: Family Medicine

## 2017-01-12 DIAGNOSIS — M533 Sacrococcygeal disorders, not elsewhere classified: Secondary | ICD-10-CM

## 2017-01-12 DIAGNOSIS — M216X9 Other acquired deformities of unspecified foot: Secondary | ICD-10-CM | POA: Diagnosis not present

## 2017-01-12 DIAGNOSIS — M2170 Unequal limb length (acquired), unspecified site: Secondary | ICD-10-CM

## 2017-01-12 MED ORDER — TRAMADOL HCL 50 MG PO TABS: 100.0000 mg | ORAL_TABLET | Freq: Two times a day (BID) | ORAL | 1 refills | Status: DC | PRN

## 2017-01-12 NOTE — Patient Instructions (Signed)
Eat to see yo u I am proud of you  We will call you when we get the orthotics in and get it all set up  Continue the vitamin D Drop off the other vitamins when you get a chance.  Stay active.  See me again in 6 months otherwise!

## 2017-01-12 NOTE — Assessment & Plan Note (Signed)
Patient has been doing relatively well. Has done well in custom orthotics. Has been greater than 2 years and is starting have breakdown of the orthotic. I do feel that a new. Necessary. This is been able to help unload her sacroiliac pain. We will get her set up to have new orthotics in the near future. We discussed the icing regimen, home exercises, we discussed an active. Patient will see me again in 2 weeks after the orthotics. Spent  25 minutes with patient face-to-face and had greater than 50% of counseling including as described above in assessment and plan.

## 2017-01-12 NOTE — Assessment & Plan Note (Signed)
Refill patient's tramadol. No change in medication his lungs patient states stable. Patient worsening symptoms we may need to consider injections or other aggressive therapies. I think patient will do well and can follow-up in 5-6 month intervals.

## 2017-01-18 ENCOUNTER — Other Ambulatory Visit: Payer: Self-pay | Admitting: Internal Medicine

## 2017-02-08 ENCOUNTER — Other Ambulatory Visit: Payer: Self-pay | Admitting: Family Medicine

## 2017-02-09 ENCOUNTER — Other Ambulatory Visit: Payer: Self-pay

## 2017-02-09 ENCOUNTER — Telehealth: Payer: Self-pay | Admitting: *Deleted

## 2017-02-09 DIAGNOSIS — J440 Chronic obstructive pulmonary disease with acute lower respiratory infection: Principal | ICD-10-CM

## 2017-02-09 DIAGNOSIS — J209 Acute bronchitis, unspecified: Secondary | ICD-10-CM

## 2017-02-09 NOTE — Telephone Encounter (Signed)
Call to patient, left message to call back. 

## 2017-02-11 NOTE — Telephone Encounter (Signed)
Follow-up call to patient. Voice mail has number confirmation. Left message to call back, for follow-up.  Needs annual exam and follow-up pap.Hx VAIN II.

## 2017-02-16 NOTE — Telephone Encounter (Signed)
Call to patient. Female answers (phone has number confirmation) Asked to speak to Sian and female immediately asked who was calling. Speech is slurred and broken and difficult to understand. Again asked for Triniti stated only it was doctors office. He states she isn't there. Asked him to let Ch[place]on know doctor's office called.

## 2017-02-17 NOTE — Telephone Encounter (Signed)
Call to patient. Left message to call back to clinical supervisor regarding important follow-up appointment.

## 2017-02-25 NOTE — Telephone Encounter (Signed)
Patient returned call and scheduled her AEX for 04/02/17 with Dr. Quincy Simmonds. The patient declined sooner appointments due to her "very busy work schedule." She apologized for not getting back with [place] sooner and confirmed her only contact number is correct.

## 2017-02-25 NOTE — Telephone Encounter (Signed)
Recall entered for 04-13-17. Routing to provider for review. Is this agreeable?

## 2017-02-25 NOTE — Telephone Encounter (Signed)
Thank you for setting up this appointment.  You may close the encounter.

## 2017-03-09 ENCOUNTER — Other Ambulatory Visit: Payer: Self-pay | Admitting: Family Medicine

## 2017-03-18 ENCOUNTER — Ambulatory Visit: Payer: Managed Care, Other (non HMO) | Admitting: Family Medicine

## 2017-03-19 ENCOUNTER — Other Ambulatory Visit: Payer: Self-pay | Admitting: Cardiology

## 2017-04-01 NOTE — Progress Notes (Deleted)
36 y.o. G40P0010 Single Caucasian female here for annual exam.    PCP:     No LMP recorded. Patient has had a hysterectomy.           Sexually active: {yes no:314532}  The current method of family planning is status post hysterectomy--ovaries remain.    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 Pap 05/2012 ASCUS and Pos. HR HPV.  Colposcopy 05/2012 LGSIL-- no vaginal treatment.  02-05-14 pap LGSIL:Pos HR HPV.  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 09-18-15 LSIL:Pos HR HPV;colpo/vulvar bx 10-11-15--VAIN II and condyloma of vulva. 01-21-16 Colposcopy and vaginal biopsy with CO2 Laser of vaginal and vulvar condyloma--pathology revealed VAIN I & II MMG:  n/a Colonoscopy:  n/a BMD:   n/a  Result  n/a TDaP:  10-20-11 Gardasil:   no HIV:*** Hep C: 03-28-14 Neg Screening Labs:  Hb today: ***, Urine today: ***   reports that she has been smoking Cigarettes.  She has a 3.00 pack-year smoking history. She has never used smokeless tobacco. She reports that she does not drink alcohol or use drugs.  Past Medical History:  Diagnosis Date  . Abnormal Pap smear of cervix    --recurrent ascus w/Pos. HR HPV  . ADD (attention deficit disorder)   . Alcohol abuse, in remission 2012  . Allergy   . Anxiety   . Aortic stenosis   . Aortic stenosis due to bicuspid aortic valve 02/20/2014  . Arthritis    In hips  . Asthma    rare inhaler use  . Bicuspid aortic valve    moderate AS by echo 08/2015 with mean AVG 56mmHg and AVA 1.1cm2  . Bipolar disorder ([place])   . Headache   . Hyperlipidemia with target LDL less than 130 01/27/2013  . Hypertension   . Muscle spasms of both lower extremities    both hips  . Tobacco abuse 09/27/2015  . 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 ABDOMINAL HYSTERECTOMY  02/06/2009   Robotic total laparoscopic hysterectomy  . ANTERIOR CRUCIATE LIGAMENT REPAIR  1993  . CARDIAC CATHETERIZATION  June 2015   no CAD - moderate AS noted  . 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 COLPOSCOPY  10/2008   CIN I & II  . COLPOSCOPY  07/2000   Neg. ECC  . COLPOSCOPY  06/2001   CIN I  . COLPOSCOPY  08/2004   ECC--atypia  . COLPOSCOPY N/A 01/21/2016   Procedure: COLPOSCOPY with vaginal biopsy with CO 2 Laser of Vaginal and vulvar condyloma;  Surgeon: [name], MD;  Location: [facility];  Service: Gynecology;  Laterality: N/A;  [name] will be here 2/21 for 1115 case confirmed 01/16/15 - TS  . HERNIA REPAIR  1981/1982  . HIP SURGERY  1981   Hip Reset   . HIP SURGERY  1990   Plate was reconstructed/ took out growth plate in Right knee  . HIP SURGERY  1992   Plate removed in Left hip  . LEFT AND RIGHT HEART CATHETERIZATION WITH CORONARY ANGIOGRAM N/A 05/18/2014   Procedure: LEFT AND RIGHT HEART  CATHETERIZATION WITH CORONARY ANGIOGRAM;  Surgeon: Jettie Booze, MD;  Location: [facility];  Service: Cardiovascular;  Laterality: N/A;  . LYMPH NODE BIOPSY  1995  . NASAL SEPTUM SURGERY  2002  . TONSILLECTOMY AND ADENOIDECTOMY  1990  . TYMPANOSTOMY TUBE PLACEMENT  1981/1982    Current Outpatient Prescriptions  Medication Sig Dispense Refill  . albuterol (PROVENTIL HFA;VENTOLIN HFA) 108 (90 Base) MCG/ACT inhaler Inhale 2 puffs into the lungs every 6 (six) hours as needed for wheezing or shortness of breath. 1 Inhaler 0  . ALPRAZolam (XANAX) 0.25 MG tablet Take 1 tablet (0.25 mg total) by mouth 2 (two) times daily as needed for anxiety. 5 tablet 0  . busPIRone (BUSPAR) 15 MG tablet Take 1 tablet (15 mg total) by mouth 3 (three) times daily. 90 tablet 3  . CALCIUM PO Take 2  tablets by mouth daily.    . fluticasone (FLONASE) 50 MCG/ACT nasal spray Place 2 sprays into both nostrils daily. 16 g 0  . Fluticasone-Salmeterol (ADVAIR) 250-50 MCG/DOSE AEPB Inhale 1 puff into the lungs 2 (two) times daily as needed (shortness of breath).    . gabapentin (NEURONTIN) 300 MG capsule TAKE 2 CAPSULES BY MOUTH THREE TIMES A DAY 360 capsule 0  . guaiFENesin (MUCINEX) 600 MG 12 hr tablet Take 1 tablet (600 mg total) by mouth 2 (two) times daily as needed for cough or to loosen phlegm. 14 tablet 0  . lamoTRIgine (LAMICTAL) 200 MG tablet TAKE 2 TABLETS (400 MG TOTAL) BY MOUTH DAILY. 180 tablet 2  . Lansoprazole (PREVACID PO) Take 1 tablet by mouth daily as needed (indigestion).    Marland Kitchen losartan (COZAAR) 50 MG tablet TAKE 1 TABLET BY MOUTH DAILY 30 tablet 0  . Multiple Vitamin (MULTIVITAMIN WITH MINERALS) TABS tablet Take 1 tablet by mouth daily.    . predniSONE (STERAPRED UNI-PAK 21 TAB) 10 MG (21) TBPK tablet Take 1 tablet (10 mg total) by mouth as directed. 21 tablet 0  . promethazine-dextromethorphan (PROMETHAZINE-DM) 6.25-15 MG/5ML syrup Take 5 mLs by mouth 3 (three) times daily as needed for cough. 240 mL 0  . SUMAtriptan (IMITREX) 100 MG tablet Take 100 mg by mouth daily as needed for migraine. May repeat in 2 hours if headache persists or recurs.    . traMADol (ULTRAM) 50 MG tablet TAKE 2 TABLETS BY MOUTH EVERY 12 HOURS AS NEEDED 180 tablet 0   No current facility-administered medications for this visit.     Family History  Problem Relation Age of Onset  . Hyperlipidemia Mother   . Hypertension Mother   . Thyroid disease Mother   . Hyperlipidemia Father   . Hypertension Father   . Migraines Father   . Hypertension Brother   . Hyperlipidemia Brother   . Thyroid disease Maternal Grandmother   . Thyroid disease Maternal Grandfather   . Cancer Neg Hx     ROS:  Pertinent items are noted in HPI.  Otherwise, a comprehensive ROS was negative.  Exam:   There were no vitals  taken for this visit.    General appearance: alert, cooperative and appears stated age Head: Normocephalic, without obvious abnormality, atraumatic Neck: no adenopathy, supple, symmetrical, trachea midline and thyroid normal to inspection and palpation Lungs: clear to auscultation bilaterally Breasts: normal appearance, no masses or tenderness, No nipple retraction or dimpling, No nipple discharge or bleeding, No axillary or supraclavicular adenopathy Heart: regular rate and rhythm Abdomen: soft, non-tender; no masses, no organomegaly Extremities: extremities normal, atraumatic, no cyanosis  or edema Skin: Skin color, texture, turgor normal. No rashes or lesions Lymph nodes: Cervical, supraclavicular, and axillary nodes normal. No abnormal inguinal nodes palpated Neurologic: Grossly normal  Pelvic: External genitalia:  no lesions              Urethra:  normal appearing urethra with no masses, tenderness or lesions              Bartholins and Skenes: normal                 Vagina: normal appearing vagina with normal color and discharge, no lesions              Cervix: no lesions              Pap taken: {yes no:314532} Bimanual Exam:  Uterus:  normal size, contour, position, consistency, mobility, non-tender              Adnexa: no mass, fullness, tenderness              Rectal exam: {yes no:314532}.  Confirms.              Anus:  normal sphincter tone, no lesions  Chaperone was present for exam.  Assessment:   Well woman visit with normal exam.   Plan: Mammogram screening discussed. Recommended self breast awareness. Pap and HR HPV as above. Guidelines for Calcium, Vitamin D, regular exercise program including cardiovascular and weight bearing exercise.   Follow up annually and prn.   Additional counseling given.  {yes [id number]. _______ minutes face to face time of which over 50% was spent in counseling.    After visit summary provided.

## 2017-04-02 ENCOUNTER — Ambulatory Visit: Payer: Managed Care, Other (non HMO) | Admitting: Obstetrics and Gynecology

## 2017-04-02 ENCOUNTER — Encounter: Payer: Self-pay | Admitting: Obstetrics and Gynecology

## 2017-04-14 ENCOUNTER — Other Ambulatory Visit: Payer: Self-pay | Admitting: Cardiology

## 2017-04-26 ENCOUNTER — Emergency Department (HOSPITAL_COMMUNITY): Payer: Managed Care, Other (non HMO)

## 2017-04-26 ENCOUNTER — Encounter (HOSPITAL_COMMUNITY): Payer: Self-pay

## 2017-04-26 DIAGNOSIS — F17210 Nicotine dependence, cigarettes, uncomplicated: Secondary | ICD-10-CM | POA: Diagnosis not present

## 2017-04-26 DIAGNOSIS — R0602 Shortness of breath: Secondary | ICD-10-CM | POA: Diagnosis present

## 2017-04-26 DIAGNOSIS — Z79899 Other long term (current) drug therapy: Secondary | ICD-10-CM | POA: Diagnosis not present

## 2017-04-26 DIAGNOSIS — J40 Bronchitis, not specified as acute or chronic: Secondary | ICD-10-CM | POA: Insufficient documentation

## 2017-04-26 DIAGNOSIS — Z8679 Personal history of other diseases of the circulatory system: Secondary | ICD-10-CM | POA: Insufficient documentation

## 2017-04-26 DIAGNOSIS — I10 Essential (primary) hypertension: Secondary | ICD-10-CM | POA: Insufficient documentation

## 2017-04-26 DIAGNOSIS — J181 Lobar pneumonia, unspecified organism: Secondary | ICD-10-CM | POA: Insufficient documentation

## 2017-04-26 NOTE — ED Triage Notes (Signed)
Pt states she came due to Sob that started on this past Thursday; pt states some productive cough; pt is current everyday smoker; family states pt due of valve replacement next month; pt denies chest pain at triage; pt a&ox 4 on arrival. Lungs sounds WNL at triage

## 2017-04-27 ENCOUNTER — Emergency Department (HOSPITAL_COMMUNITY)
Admission: EM | Admit: 2017-04-27 | Discharge: 2017-04-27 | Disposition: A | Discharge status: Home / Self Care | Payer: Managed Care, Other (non HMO) | Attending: Emergency Medicine | Admitting: Emergency Medicine

## 2017-04-27 DIAGNOSIS — F17200 Nicotine dependence, unspecified, uncomplicated: Secondary | ICD-10-CM

## 2017-04-27 DIAGNOSIS — Z8679 Personal history of other diseases of the circulatory system: Secondary | ICD-10-CM

## 2017-04-27 DIAGNOSIS — J189 Pneumonia, unspecified organism: Secondary | ICD-10-CM

## 2017-04-27 DIAGNOSIS — J40 Bronchitis, not specified as acute or chronic: Secondary | ICD-10-CM

## 2017-04-27 DIAGNOSIS — J181 Lobar pneumonia, unspecified organism: Secondary | ICD-10-CM

## 2017-04-27 DIAGNOSIS — IMO0001 Reserved for inherently not codable concepts without codable children: Secondary | ICD-10-CM

## 2017-04-27 LAB — CBC WITH DIFFERENTIAL/PLATELET
Basophils Absolute: 0 10*3/uL (ref 0.0–0.1)
Basophils Relative: 0 %
Eosinophils Absolute: 0.2 10*3/uL (ref 0.0–0.7)
Eosinophils Relative: 2 %
HEMATOCRIT: 43.1 % (ref 36.0–46.0)
HEMOGLOBIN: 14.4 g/dL (ref 12.0–15.0)
LYMPHS ABS: 2.6 10*3/uL (ref 0.7–4.0)
Lymphocytes Relative: 27 %
MCH: 30.2 pg (ref 26.0–34.0)
MCHC: 33.4 g/dL (ref 30.0–36.0)
MCV: 90.4 fL (ref 78.0–100.0)
MONO ABS: 0.7 10*3/uL (ref 0.1–1.0)
Monocytes Relative: 7 %
NEUTROS ABS: 6.2 10*3/uL (ref 1.7–7.7)
NEUTROS PCT: 64 %
Platelets: 146 10*3/uL — ABNORMAL LOW (ref 150–400)
RBC: 4.77 MIL/uL (ref 3.87–5.11)
RDW: 13.4 % (ref 11.5–15.5)
WBC: 9.6 10*3/uL (ref 4.0–10.5)

## 2017-04-27 LAB — BASIC METABOLIC PANEL
ANION GAP: 11 (ref 5–15)
BUN: 16 mg/dL (ref 6–20)
CALCIUM: 9.2 mg/dL (ref 8.9–10.3)
CHLORIDE: 102 mmol/L (ref 101–111)
CO2: 22 mmol/L (ref 22–32)
CREATININE: 0.9 mg/dL (ref 0.44–1.00)
GFR calc non Af Amer: >60 mL/min (ref >60)
GLUCOSE: 116 mg/dL — AB (ref 65–99)
Potassium: 3.6 mmol/L (ref 3.5–5.1)
Sodium: 135 mmol/L (ref 135–145)

## 2017-04-27 LAB — I-STAT TROPONIN, ED: Troponin i, poc: 0 ng/mL (ref 0.00–0.08)

## 2017-04-27 LAB — BRAIN NATRIURETIC PEPTIDE: B Natriuretic Peptide: 29.8 pg/mL (ref 0.0–100.0)

## 2017-04-27 MED ORDER — AZITHROMYCIN 250 MG PO TABS: 250.0000 mg | ORAL_TABLET | Freq: Every day | ORAL | 0 refills | Status: DC

## 2017-04-27 MED ORDER — PREDNISONE 20 MG PO TABS
60.0000 mg | ORAL_TABLET | Freq: Once | ORAL | Status: AC
  Administered 2017-04-27: 60 mg via ORAL
  Filled 2017-04-27: qty 3

## 2017-04-27 MED ORDER — BENZONATATE 100 MG PO CAPS: 100.0000 mg | ORAL_CAPSULE | Freq: Three times a day (TID) | ORAL | 0 refills | Status: DC | PRN

## 2017-04-27 MED ORDER — FLUTICASONE PROPIONATE 50 MCG/ACT NA SUSP: 2.0000 | Freq: Every day | NASAL | 0 refills | Status: DC

## 2017-04-27 MED ORDER — AEROCHAMBER PLUS FLO-VU LARGE MISC
1.0000 | Freq: Once | Status: AC
  Administered 2017-04-27: 1

## 2017-04-27 MED ORDER — IPRATROPIUM BROMIDE 0.02 % IN SOLN
0.5000 mg | Freq: Once | RESPIRATORY_TRACT | Status: AC
  Administered 2017-04-27: 0.5 mg via RESPIRATORY_TRACT
  Filled 2017-04-27: qty 2.5

## 2017-04-27 MED ORDER — AEROCHAMBER PLUS FLO-VU LARGE MISC
Status: AC
  Filled 2017-04-27: qty 1

## 2017-04-27 MED ORDER — ALBUTEROL SULFATE (2.5 MG/3ML) 0.083% IN NEBU
5.0000 mg | INHALATION_SOLUTION | Freq: Once | RESPIRATORY_TRACT | Status: AC
  Administered 2017-04-27: 5 mg via RESPIRATORY_TRACT
  Filled 2017-04-27: qty 6

## 2017-04-27 MED ORDER — PREDNISONE 20 MG PO TABS: 40.0000 mg | ORAL_TABLET | Freq: Every day | ORAL | 0 refills | Status: DC

## 2017-04-27 MED ORDER — ALBUTEROL SULFATE HFA 108 (90 BASE) MCG/ACT IN AERS
2.0000 | INHALATION_SPRAY | Freq: Once | RESPIRATORY_TRACT | Status: AC
  Administered 2017-04-27: 2 via RESPIRATORY_TRACT
  Filled 2017-04-27: qty 6.7

## 2017-04-27 MED ORDER — CETIRIZINE HCL 10 MG PO TABS: 10.0000 mg | ORAL_TABLET | Freq: Every day | ORAL | 1 refills | Status: DC

## 2017-04-27 NOTE — ED Notes (Signed)
Pt departed in NAD, refused use of wheelchair.  

## 2017-04-27 NOTE — ED Notes (Addendum)
Placed patient on 2L O2 [place] d/t SpO2 dropping to 90% RA.

## 2017-04-27 NOTE — ED Provider Notes (Signed)
[facility] DEPT Provider Note   CSN: [id number] Arrival date & time: 04/26/17  2241     History   Chief Complaint Chief Complaint  Patient presents with  . Shortness of Breath    HPI Danielle Obrien is a 36 y.o. female.  Danielle Obrien is a 36 y.o. Female with a history of aortic stenosis, HTN, smoking, and anxiety, who presents to the ED complaining of cough, shortness of breath and chest tightness for the past 4 days. Patient reports she initially felt she had symptoms due to allergies. She reports associated symptoms of nasal congestion, postnasal drip, and ear pressure. She reports she has had lots of coughing with associated chest tightness and shortness of breath. She denies chest pain. She does report some associated wheezing. She is taking an allergy pill today without relief of her symptoms. She has a history of aortic stenosis and has plans to receive aortic valve replacement sometime in the future. She has been seen by CT surgeon Dr. Ricard Dillon. She denies fevers, sore throat, leg pain, leg swelling, chest pain, hemoptysis, abdominal pain, nausea, vomiting, urinary symptoms, rashes or syncope.   The history is provided by the patient, medical records and a significant other. No language interpreter was used.  Shortness of Breath  Associated symptoms include rhinorrhea, ear pain, cough and wheezing. Pertinent negatives include no fever, no headaches, no sore throat, no neck pain, no chest pain, no vomiting, no abdominal pain, no rash and no leg swelling.    Past Medical History:  Diagnosis Date  . Abnormal Pap smear of cervix    --recurrent ascus w/Pos. HR HPV  . ADD (attention deficit disorder)   . Alcohol abuse, in remission 2012  . Allergy   . Anxiety   . Aortic stenosis   . Aortic stenosis due to bicuspid aortic valve 02/20/2014  . Arthritis    In hips  . Asthma    rare inhaler use  . Bicuspid aortic valve    moderate AS by echo 08/2015 with mean AVG 104mmHg and  AVA 1.1cm2  . Bipolar disorder ([place])   . Headache   . Hyperlipidemia with target LDL less than 130 01/27/2013  . Hypertension   . Muscle spasms of both lower extremities    both hips  . Tobacco abuse 09/27/2015  . 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 Pronation deformity of ankle, acquired 01/12/2017  . Anxiety state 08/24/2016  . Tobacco abuse 09/27/2015  . Nonallopathic lesion of sacral region 04/10/2015  . SI (sacroiliac) joint dysfunction 02/20/2015  . Leg length discrepancy 02/20/2015  . Hip pain 01/07/2015  . Allergic rhinitis, cause unspecified 05/04/2014  . Aortic stenosis due to bicuspid aortic valve 02/20/2014  . Bipolar disorder ([place]) 02/05/2014  . Bicuspid aortic valve 01/27/2013  . Migraine with status migrainosus 01/27/2013  . Hyperlipidemia with target LDL less than 130 01/27/2013  . Routine general medical examination at a health care facility 10/20/2011  . Mild persistent asthma 04/21/2011  . Essential hypertension, benign 04/21/2011    Past Surgical History:  Procedure Laterality Date  . ABDOMINAL HYSTERECTOMY  02/06/2009   Robotic total laparoscopic hysterectomy  . ANTERIOR CRUCIATE LIGAMENT REPAIR  1993  . CARDIAC CATHETERIZATION  June 2015   no CAD - moderate AS noted  . 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 COLPOSCOPY  10/2008   CIN I & II  . COLPOSCOPY  07/2000   Neg. ECC  . COLPOSCOPY  06/2001   CIN I  . COLPOSCOPY  08/2004   ECC--atypia  . COLPOSCOPY N/A 01/21/2016   Procedure: COLPOSCOPY with vaginal biopsy with CO 2 Laser of Vaginal and vulvar condyloma;  Surgeon: [name], MD;  Location: [facility];  Service: Gynecology;  Laterality: N/A;  [name] will be here 2/21 for 1115 case confirmed 01/16/15 - TS  . HERNIA REPAIR  1981/1982  . HIP SURGERY  1981   Hip Reset   . HIP  SURGERY  1990   Plate was reconstructed/ took out growth plate in Right knee  . HIP SURGERY  1992   Plate removed in Left hip  . LEFT AND RIGHT HEART CATHETERIZATION WITH CORONARY ANGIOGRAM N/A 05/18/2014   Procedure: LEFT AND RIGHT HEART CATHETERIZATION WITH CORONARY ANGIOGRAM;  Surgeon: Jettie Booze, MD;  Location: [facility] CATH LAB;  Service: Cardiovascular;  Laterality: N/A;  . LYMPH NODE BIOPSY  1995  . NASAL SEPTUM SURGERY  2002  . TONSILLECTOMY AND ADENOIDECTOMY  1990  . TYMPANOSTOMY TUBE PLACEMENT  1981/1982    OB History    Gravida Para Term Preterm AB Living   1       1 0   SAB TAB Ectopic Multiple Live Births                   Home Medications    Prior to Admission medications   Medication Sig Start Date End Date Taking? Authorizing Provider  albuterol (PROVENTIL HFA;VENTOLIN HFA) 108 (90 Base) MCG/ACT inhaler Inhale 2 puffs into the lungs every 6 (six) hours as needed for wheezing or shortness of breath. 12/21/16   Nche, Charlene Brooke, NP  ALPRAZolam Duanne Moron) 0.25 MG tablet Take 1 tablet (0.25 mg total) by mouth 2 (two) times daily as needed for anxiety. 08/20/16   Nche, Charlene Brooke, NP  azithromycin (ZITHROMAX Z-PAK) 250 MG tablet Take 1 tablet (250 mg total) by mouth daily. 500mg  PO day 1, then 250mg  PO days 205 04/27/17   Waynetta Pean, PA-C  benzonatate (TESSALON) 100 MG capsule Take 1 capsule (100 mg total) by mouth 3 (three) times daily as needed for cough. 04/27/17   Waynetta Pean, PA-C  busPIRone (BUSPAR) 15 MG tablet Take 1 tablet (15 mg total) by mouth 3 (three) times daily. 11/05/16   Nche, Charlene Brooke, NP  CALCIUM PO Take 2 tablets by mouth daily.    [provider]  cetirizine (ZYRTEC ALLERGY) 10 MG tablet Take 1 tablet (10 mg total) by mouth daily. 04/27/17   Waynetta Pean, PA-C  fluticasone (FLONASE) 50 MCG/ACT nasal spray Place 2 sprays into both nostrils daily. 04/27/17   Waynetta Pean, PA-C  Fluticasone-Salmeterol (ADVAIR) 250-50 MCG/DOSE  AEPB Inhale 1 puff into the lungs 2 (two) times daily as needed (shortness of breath).    [provider]  gabapentin (NEURONTIN) 300 MG capsule TAKE 2 CAPSULES BY MOUTH THREE TIMES A DAY 03/09/17   Lyndal Pulley, DO  guaiFENesin (MUCINEX) 600 MG 12 hr tablet Take 1 tablet (600 mg total) by mouth 2 (two) times daily as needed for cough or to loosen phlegm. 12/21/16   Nche, Charlene Brooke, NP  lamoTRIgine (LAMICTAL) 200 MG tablet TAKE 2 TABLETS (400 MG TOTAL) BY MOUTH DAILY. 01/18/17   Janith Lima, MD  Lansoprazole (PREVACID PO) Take 1 tablet by  mouth daily as needed (indigestion).    [provider]  losartan (COZAAR) 50 MG tablet TAKE ONE TABLET BY MOUTH DAILY 04/14/17   Sueanne Margarita, MD  Multiple Vitamin (MULTIVITAMIN WITH MINERALS) TABS tablet Take 1 tablet by mouth daily.    [provider]  predniSONE (DELTASONE) 20 MG tablet Take 2 tablets (40 mg total) by mouth daily. 04/27/17   Waynetta Pean, PA-C  promethazine-dextromethorphan (PROMETHAZINE-DM) 6.25-15 MG/5ML syrup Take 5 mLs by mouth 3 (three) times daily as needed for cough. 12/21/16   Nche, Charlene Brooke, NP  SUMAtriptan (IMITREX) 100 MG tablet Take 100 mg by mouth daily as needed for migraine. May repeat in 2 hours if headache persists or recurs.    [provider]  traMADol (ULTRAM) 50 MG tablet TAKE 2 TABLETS BY MOUTH EVERY 12 HOURS AS NEEDED 02/09/17   Lyndal Pulley, DO    Family History Family History  Problem Relation Age of Onset  . Hyperlipidemia Mother   . Hypertension Mother   . Thyroid disease Mother   . Hyperlipidemia Father   . Hypertension Father   . Migraines Father   . Hypertension Brother   . Hyperlipidemia Brother   . Thyroid disease Maternal Grandmother   . Thyroid disease Maternal Grandfather   . Cancer Neg Hx     Social History Social History  Substance Use Topics  . Smoking status: Current Every Day Smoker    Packs/day: 0.25    Years: 12.00    Types:  Cigarettes  . Smokeless tobacco: Never Used  . Alcohol use No     Allergies   Demerol; Ibuprofen; Morphine and related; and Codeine   Review of Systems Review of Systems  Constitutional: Negative for chills and fever.  HENT: Positive for congestion, ear pain, postnasal drip, rhinorrhea and sneezing. Negative for hearing loss, sore throat and trouble swallowing.   Eyes: Negative for visual disturbance.  Respiratory: Positive for cough, chest tightness, shortness of breath and wheezing.   Cardiovascular: Negative for chest pain, palpitations and leg swelling.  Gastrointestinal: Negative for abdominal pain, diarrhea, nausea and vomiting.  Genitourinary: Negative for dysuria.  Musculoskeletal: Negative for back pain and neck pain.  Skin: Negative for rash.  Neurological: Negative for weakness, numbness and headaches.     Physical Exam Updated Vital Signs BP 103/75   Pulse (!) 103   Temp 98.7 F (37.1 C) (Oral)   Resp (!) 27   Ht 5' (1.524 m)   Wt 50.8 kg (112 lb)   SpO2 90%   BMI 21.87 kg/m   Physical Exam  Constitutional: She appears well-developed and well-nourished. No distress.  Nontoxic appearing.  HENT:  Head: Normocephalic and atraumatic.  Mouth/Throat: Oropharynx is clear and moist.  Eyes: Conjunctivae are normal. Pupils are equal, round, and reactive to light. Right eye exhibits no discharge. Left eye exhibits no discharge.  Neck: Neck supple.  Cardiovascular: Normal rate, regular rhythm and intact distal pulses.  Exam reveals no gallop and no friction rub.   Murmur heard. Systolic ejection murmur noted. Bilateral radial, posterior tibialis and dorsalis pedis pulses are intact.    Pulmonary/Chest: Effort normal. No respiratory distress. She has wheezes. She has no rales.  Mild wheezes noted bilaterally. No increased work of breathing. No rales or rhonchi.  Abdominal: Soft. There is no tenderness.  Musculoskeletal: She exhibits no edema or tenderness.  No  lower extremity edema or tenderness.  Lymphadenopathy:    She has no cervical adenopathy.  Neurological: She  is alert. Coordination normal.  Skin: Skin is warm and dry. Capillary refill takes less than 2 seconds. No rash noted. She is not diaphoretic. No erythema. No pallor.  Psychiatric: She has a normal mood and affect. Her behavior is normal.  Nursing note and vitals reviewed.    ED Treatments / Results  Labs (all labs ordered are listed, but only abnormal results are displayed) Labs Reviewed  BASIC METABOLIC PANEL - Abnormal; Notable for the following:       Result Value   Glucose, Bld 116 (*)    All other components within normal limits  CBC WITH DIFFERENTIAL/PLATELET - Abnormal; Notable for the following:    Platelets 146 (*)    All other components within normal limits  BRAIN NATRIURETIC PEPTIDE  I-STAT TROPOININ, ED    EKG  EKG Interpretation  Date/Time:  Monday Apr 26 2017 22:48:12 EDT Ventricular Rate:  92 PR Interval:  134 QRS Duration: 90 QT Interval:  350 QTC Calculation: 432 R Axis:   57 Text Interpretation:   Poor data quality, interpretation may be adversely affected Normal sinus rhythm Possible Left atrial enlargement Nonspecific ST abnormality Abnormal ECG When compared with ECG of 05/18/2014, No significant change was found Confirmed by Delora Fuel ([id number]) on 04/27/2017 12:29:10 AM       Radiology Dg Chest 2 View  Result Date: 04/26/2017 CLINICAL DATA:  Chest pain and dyspnea with cough. History bicuspid aortic valve and asthma. EXAM: CHEST  2 VIEW COMPARISON:  None. FINDINGS: The heart size and mediastinal contours are within normal limits. Lungs are hyperinflated. Subtle airspace opacity in the right upper lobe, suspicious for pneumonia. The visualized skeletal structures are unremarkable. IMPRESSION: No active hyperinflated lungs. Subtle airspace opacity in the right upper lobe suspicious for pneumonia. Electronically Signed   By: Ashley Royalty M.D.   On:  04/26/2017 23:31    Procedures Procedures (including critical care time)  Medications Ordered in ED Medications  AEROCHAMBER PLUS FLO-VU LARGE MISC (not administered)  albuterol (PROVENTIL) (2.5 MG/3ML) 0.083% nebulizer solution 5 mg (5 mg Nebulization Given 04/27/17 0155)  ipratropium (ATROVENT) nebulizer solution 0.5 mg (0.5 mg Nebulization Given 04/27/17 0155)  albuterol (PROVENTIL HFA;VENTOLIN HFA) 108 (90 Base) MCG/ACT inhaler 2 puff (2 puffs Inhalation Given 04/27/17 0328)  AEROCHAMBER PLUS FLO-VU LARGE MISC 1 each (1 each Other Given 04/27/17 0330)  predniSONE (DELTASONE) tablet 60 mg (60 mg Oral Given 04/27/17 0328)     Initial Impression / Assessment and Plan / ED Course  I have reviewed the triage vital signs and the nursing notes.  Pertinent labs & imaging results that were available during my care of the patient were reviewed by me and considered in my medical decision making (see chart for details).    This is a 36 y.o. Female with a history of aortic stenosis, HTN, smoking, and anxiety, who presents to the ED complaining of cough, shortness of breath and chest tightness for the past 4 days. Patient reports she initially felt she had symptoms due to allergies. She reports associated symptoms of nasal congestion, postnasal drip, and ear pressure. She reports she has had lots of coughing with associated chest tightness and shortness of breath. She denies chest pain. She does report some associated wheezing. She is taking an allergy pill today without relief of her symptoms. She has a history of aortic stenosis and has plans to receive aortic valve replacement sometime in the future. She has been seen by CT surgeon Dr. Ricard Dillon. She denies fevers.  On exam patient is afebrile nontoxic appearing. She has mild scattered wheezes noted bilaterally. No increased work of breathing. Oxygen saturation is 97% on room air during my exam. No lower extremity edema or tenderness noted. She does have a  systolic ejection murmur which is consistent with her diagnosis of aortic stenosis. EKG is unchanged from her last tracing. Troponin is not elevated. I see no need for delta troponin is patient has had symptoms for 4 days. BNP is not elevated. BMP is unremarkable. CBC is unremarkable. No leukocytosis. No fever. Chest x-ray shows a subtle airspace opacity in the right upper lobe suspicious for pneumonia. At reevaluation patient reports feeling much better after breathing treatment. Repeat lung exam is improved. Clinically patient seems to have bronchitis. Due to concern for infiltrate on x-ray and her risk factors will start her on Nasacort for myosin to cover for community-acquired pneumonia. We'll provide with a short course of steroids. Albuterol inhaler discharge. Flonase and Zyrtec for allergy symptoms. I discussed strict and specific return precautions. Low suspicion for ACS and PE for this patient. I advised the patient to follow-up with their primary care provider this week. I advised the patient to return to the emergency department with new or worsening symptoms or new concerns. The patient verbalized understanding and agreement with plan.    This patient was discussed with Dr. Dina Rich who agrees with assessment and plan.   The patient was counseled on the dangers of tobacco use, and was advised to quit.  Reviewed strategies to maximize success, including removing cigarettes and smoking materials from environment, substitution of other forms of reinforcement and support of family/friends.   Final Clinical Impressions(s) / ED Diagnoses   Final diagnoses:  Community acquired pneumonia of right middle lobe of lung ([place])  Bronchitis  Smoking  History of aortic stenosis    New Prescriptions Discharge Medication List as of 04/27/2017  3:22 AM    START taking these medications   Details  azithromycin (ZITHROMAX Z-PAK) 250 MG tablet Take 1 tablet (250 mg total) by mouth daily. 500mg  PO day 1,  then 250mg  PO days 205, Starting Tue 04/27/2017, Print    benzonatate (TESSALON) 100 MG capsule Take 1 capsule (100 mg total) by mouth 3 (three) times daily as needed for cough., Starting Tue 04/27/2017, Print    cetirizine (ZYRTEC ALLERGY) 10 MG tablet Take 1 tablet (10 mg total) by mouth daily., Starting Tue 04/27/2017, Print    predniSONE (DELTASONE) 20 MG tablet Take 2 tablets (40 mg total) by mouth daily., Starting Tue 04/27/2017, Print         Waynetta Pean, PA-C 04/27/17 3343    Merryl Hacker, MD 04/27/17 [phone]

## 2017-04-30 ENCOUNTER — Ambulatory Visit (INDEPENDENT_AMBULATORY_CARE_PROVIDER_SITE_OTHER): Payer: Managed Care, Other (non HMO) | Admitting: Nurse Practitioner

## 2017-04-30 ENCOUNTER — Encounter: Payer: Self-pay | Admitting: Nurse Practitioner

## 2017-04-30 VITALS — BP 110/78 | HR 84 | Temp 97.9°F | Ht 60.0 in | Wt 109.0 lb

## 2017-04-30 DIAGNOSIS — J181 Lobar pneumonia, unspecified organism: Secondary | ICD-10-CM | POA: Diagnosis not present

## 2017-04-30 DIAGNOSIS — J189 Pneumonia, unspecified organism: Secondary | ICD-10-CM

## 2017-04-30 DIAGNOSIS — F411 Generalized anxiety disorder: Secondary | ICD-10-CM | POA: Diagnosis not present

## 2017-04-30 DIAGNOSIS — Z720 Tobacco use: Secondary | ICD-10-CM

## 2017-04-30 DIAGNOSIS — J440 Chronic obstructive pulmonary disease with acute lower respiratory infection: Secondary | ICD-10-CM

## 2017-04-30 DIAGNOSIS — J209 Acute bronchitis, unspecified: Secondary | ICD-10-CM

## 2017-04-30 DIAGNOSIS — F3177 Bipolar disorder, in partial remission, most recent episode mixed: Secondary | ICD-10-CM | POA: Diagnosis not present

## 2017-04-30 MED ORDER — BUSPIRONE HCL 15 MG PO TABS: 15.0000 mg | ORAL_TABLET | Freq: Three times a day (TID) | ORAL | 3 refills | Status: DC

## 2017-04-30 MED ORDER — BUSPIRONE HCL 30 MG PO TABS: 15.0000 mg | ORAL_TABLET | Freq: Three times a day (TID) | ORAL | 1 refills | Status: DC

## 2017-04-30 MED ORDER — BUSPIRONE HCL 30 MG PO TABS: 30.0000 mg | ORAL_TABLET | Freq: Two times a day (BID) | ORAL | 1 refills | Status: DC

## 2017-04-30 MED ORDER — PROMETHAZINE-DM 6.25-15 MG/5ML PO SYRP: 5.0000 mL | ORAL_SOLUTION | Freq: Three times a day (TID) | ORAL | 0 refills | Status: DC | PRN

## 2017-04-30 NOTE — Patient Instructions (Signed)
Complete oral abx and prednisone as prescribed.  Need repeat CXR in 77month.  Encourage to stop tobacco use.

## 2017-04-30 NOTE — Progress Notes (Signed)
Subjective:  Patient ID: Danielle Obrien, female    DOB: Sep 16, 1981  Age: 36 y.o. MRN: [id number]  CC: Hospitalization Follow-up (ER follow up for SOB/bronchitis/pneumonia---still on meds but not completely better. )   Cough  This is a new problem. The current episode started in the past 7 days. The problem has been gradually improving. The problem occurs constantly. The cough is productive of purulent sputum. Associated symptoms include shortness of breath and wheezing. Pertinent negatives include no chest pain, chills, fever, nasal congestion, postnasal drip, rhinorrhea or sore throat. The symptoms are aggravated by lying down. Risk factors for lung disease include smoking/tobacco exposure. She has tried a beta-agonist inhaler, oral steroids, OTC cough suppressant, prescription cough suppressant and steroid inhaler for the symptoms. The treatment provided mild relief. Her past medical history is significant for bronchitis, COPD, environmental allergies and pneumonia.   Depression: Needs another referral to psychiatrist covered by her insurance.  Outpatient Medications Prior to Visit  Medication Sig Dispense Refill  . albuterol (PROVENTIL HFA;VENTOLIN HFA) 108 (90 Base) MCG/ACT inhaler Inhale 2 puffs into the lungs every 6 (six) hours as needed for wheezing or shortness of breath. 1 Inhaler 0  . benzonatate (TESSALON) 100 MG capsule Take 1 capsule (100 mg total) by mouth 3 (three) times daily as needed for cough. 21 capsule 0  . CALCIUM PO Take 2 tablets by mouth daily.    . cetirizine (ZYRTEC ALLERGY) 10 MG tablet Take 1 tablet (10 mg total) by mouth daily. 30 tablet 1  . fluticasone (FLONASE) 50 MCG/ACT nasal spray Place 2 sprays into both nostrils daily. 16 g 0  . Fluticasone-Salmeterol (ADVAIR) 250-50 MCG/DOSE AEPB Inhale 1 puff into the lungs 2 (two) times daily as needed (shortness of breath).    . gabapentin (NEURONTIN) 300 MG capsule TAKE 2 CAPSULES BY MOUTH THREE TIMES A DAY 360  capsule 0  . guaiFENesin (MUCINEX) 600 MG 12 hr tablet Take 1 tablet (600 mg total) by mouth 2 (two) times daily as needed for cough or to loosen phlegm. 14 tablet 0  . lamoTRIgine (LAMICTAL) 200 MG tablet TAKE 2 TABLETS (400 MG TOTAL) BY MOUTH DAILY. 180 tablet 2  . Lansoprazole (PREVACID PO) Take 1 tablet by mouth daily as needed (indigestion).    Marland Kitchen losartan (COZAAR) 50 MG tablet TAKE ONE TABLET BY MOUTH DAILY 30 tablet 0  . Multiple Vitamin (MULTIVITAMIN WITH MINERALS) TABS tablet Take 1 tablet by mouth daily.    . predniSONE (DELTASONE) 20 MG tablet Take 2 tablets (40 mg total) by mouth daily. 10 tablet 0  . traMADol (ULTRAM) 50 MG tablet TAKE 2 TABLETS BY MOUTH EVERY 12 HOURS AS NEEDED 180 tablet 0  . SUMAtriptan (IMITREX) 100 MG tablet Take 100 mg by mouth daily as needed for migraine. May repeat in 2 hours if headache persists or recurs.    . ALPRAZolam (XANAX) 0.25 MG tablet Take 1 tablet (0.25 mg total) by mouth 2 (two) times daily as needed for anxiety. (Patient not taking: Reported on 04/30/2017) 5 tablet 0  . azithromycin (ZITHROMAX Z-PAK) 250 MG tablet Take 1 tablet (250 mg total) by mouth daily. 500mg  PO day 1, then 250mg  PO days 205 (Patient not taking: Reported on 04/30/2017) 6 tablet 0  . busPIRone (BUSPAR) 15 MG tablet Take 1 tablet (15 mg total) by mouth 3 (three) times daily. (Patient not taking: Reported on 04/30/2017) 90 tablet 3  . promethazine-dextromethorphan (PROMETHAZINE-DM) 6.25-15 MG/5ML syrup Take 5 mLs by mouth 3 (three)  times daily as needed for cough. (Patient not taking: Reported on 04/30/2017) 240 mL 0   No facility-administered medications prior to visit.     ROS See HPI  Objective:  BP 110/78   Pulse 84   Temp 97.9 F (36.6 C)   Ht 5' (1.524 m)   Wt 109 lb (49.4 kg)   SpO2 96%   BMI 21.29 kg/m   BP Readings from Last 3 Encounters:  04/30/17 110/78  04/27/17 103/75  01/12/17 120/88    Wt Readings from Last 3 Encounters:  04/30/17 109 lb (49.4 kg)    04/27/17 112 lb (50.8 kg)  01/12/17 117 lb 12.8 oz (53.4 kg)    Physical Exam  Constitutional: She is oriented to person, place, and time.  HENT:  Right Ear: Tympanic membrane, external ear and ear canal normal.  Left Ear: Tympanic membrane, external ear and ear canal normal.  Nose: No rhinorrhea. Right sinus exhibits no maxillary sinus tenderness and no frontal sinus tenderness. Left sinus exhibits no maxillary sinus tenderness and no frontal sinus tenderness.  Mouth/Throat: Uvula is midline. No trismus in the jaw. No oropharyngeal exudate or posterior oropharyngeal erythema.  Eyes: No scleral icterus.  Neck: Normal range of motion. Neck supple.  Cardiovascular: Normal rate.   Murmur heard. Pulmonary/Chest: Effort normal and breath sounds normal.  Musculoskeletal: She exhibits no edema.  Lymphadenopathy:    She has no cervical adenopathy.  Neurological: She is alert and oriented to person, place, and time.  Skin: Skin is warm and dry.  Vitals reviewed.   Lab Results  Component Value Date   WBC 9.6 04/27/2017   HGB 14.4 04/27/2017   HCT 43.1 04/27/2017   PLT 146 (L) 04/27/2017   GLUCOSE 116 (H) 04/27/2017   CHOL 254 (H) 08/20/2016   TRIG 67.0 08/20/2016   HDL 79.00 08/20/2016   LDLDIRECT 171.0 01/27/2013   LDLCALC 162 (H) 08/20/2016   ALT 19 01/14/2016   AST 19 01/14/2016   NA 135 04/27/2017   K 3.6 04/27/2017   CL 102 04/27/2017   CREATININE 0.90 04/27/2017   BUN 16 04/27/2017   CO2 22 04/27/2017   TSH 0.64 01/14/2016   INR 0.9 05/15/2014    No results found.  Assessment & Plan:   [name] was seen today for hospitalization follow-up.  Diagnoses and all orders for this visit:  Community acquired pneumonia of right upper lobe of lung ([place])  Anxiety state -     Discontinue: busPIRone (BUSPAR) 15 MG tablet; Take 1 tablet (15 mg total) by mouth 3 (three) times daily. -     Ambulatory referral to Psychiatry -     Discontinue: busPIRone (BUSPAR) 30 MG tablet;  Take 0.5 tablets (15 mg total) by mouth 3 (three) times daily. -     busPIRone (BUSPAR) 30 MG tablet; Take 1 tablet (30 mg total) by mouth 2 (two) times daily at 10 AM and 5 PM.  Bipolar disorder, in partial remission, most recent episode mixed ([place]) -     Ambulatory referral to Psychiatry  COPD (chronic obstructive pulmonary disease) with acute bronchitis ([place]) -     promethazine-dextromethorphan (PROMETHAZINE-DM) 6.25-15 MG/5ML syrup; Take 5 mLs by mouth 3 (three) times daily as needed for cough.  Tobacco abuse -     promethazine-dextromethorphan (PROMETHAZINE-DM) 6.25-15 MG/5ML syrup; Take 5 mLs by mouth 3 (three) times daily as needed for cough.   I have discontinued Ms. Bieri's ALPRAZolam, busPIRone, azithromycin, and busPIRone. I have also changed her busPIRone.  Additionally, I am having her maintain her CALCIUM PO, Fluticasone-Salmeterol, SUMAtriptan, Lansoprazole (PREVACID PO), multivitamin with minerals, guaiFENesin, albuterol, lamoTRIgine, traMADol, gabapentin, losartan, predniSONE, fluticasone, cetirizine, benzonatate, and promethazine-dextromethorphan.  Meds ordered this encounter  Medications  . DISCONTD: busPIRone (BUSPAR) 15 MG tablet    Sig: Take 1 tablet (15 mg total) by mouth 3 (three) times daily.    Dispense:  90 tablet    Refill:  3    Order Specific Question:   Supervising Provider    Answer:   Cassandria Anger [[id number]]  . DISCONTD: busPIRone (BUSPAR) 30 MG tablet    Sig: Take 0.5 tablets (15 mg total) by mouth 3 (three) times daily.    Dispense:  90 tablet    Refill:  1    Order Specific Question:   Supervising Provider    Answer:   Cassandria Anger [[id number]]  . busPIRone (BUSPAR) 30 MG tablet    Sig: Take 1 tablet (30 mg total) by mouth 2 (two) times daily at 10 AM and 5 PM.    Dispense:  60 tablet    Refill:  1    Order Specific Question:   Supervising Provider    Answer:   Cassandria Anger [[id number]]  . promethazine-dextromethorphan (PROMETHAZINE-DM)  6.25-15 MG/5ML syrup    Sig: Take 5 mLs by mouth 3 (three) times daily as needed for cough.    Dispense:  120 mL    Refill:  0    Order Specific Question:   Supervising Provider    Answer:   Cassandria Anger [[id number]]    Follow-up: Return in about 4 weeks (around 05/28/2017) for CPE (fasting).  Wilfred Lacy, NP

## 2017-05-05 ENCOUNTER — Other Ambulatory Visit: Payer: Self-pay | Admitting: Family Medicine

## 2017-05-05 NOTE — Telephone Encounter (Signed)
Refill done.  

## 2017-05-10 ENCOUNTER — Other Ambulatory Visit: Payer: Self-pay | Admitting: *Deleted

## 2017-05-10 MED ORDER — TRAMADOL HCL 50 MG PO TABS: ORAL_TABLET | ORAL | 0 refills | Status: DC

## 2017-05-11 ENCOUNTER — Other Ambulatory Visit: Payer: Self-pay | Admitting: Cardiology

## 2017-05-14 NOTE — Progress Notes (Signed)
36 y.o. G44P0010 Single Caucasian female here for annual exam.    Had community acquired pneumonia.   May have an aortic valve replacement.   Partner had testing for hep C that apparently is positive.   Still smoking but cutting down.   Changes at work.  Works at [employer].   PCP:  Dr. Ludwig Clarks  No LMP recorded. Patient has had a hysterectomy.           Sexually active: Yes.    The current method of family planning is status post hysterectomy.    Exercising: Yes.    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  History of abnormal Pap:  yes MMG:  N/A Colonoscopy:  N/A BMD:   N/A  Result  N/A TDaP:  10/20/2011 Gardasil:   no HIV: 03/28/14 Negative Hep C: 03/28/14 Negative Screening Labs: Discuss today   reports that she has been smoking Cigarettes.  She has a 6.00 pack-year smoking history. She has never used smokeless tobacco. She reports that she does not drink alcohol or use drugs.  Past Medical History:  Diagnosis Date  . Abnormal Pap smear of cervix    --recurrent ascus w/Pos. HR HPV  . ADD (attention deficit disorder)   . Alcohol abuse, in remission 2012  . Allergy   . Anxiety   . Aortic stenosis   . Aortic stenosis due to bicuspid aortic valve 02/20/2014  . Arthritis    In hips  . Asthma    rare inhaler use  . Bicuspid aortic valve    moderate AS by echo 08/2015 with mean AVG 98mmHg and AVA 1.1cm2  . Bipolar disorder ([place])   . Headache   . Hyperlipidemia with target LDL less than 130 01/27/2013  . Hypertension   . Muscle spasms of both lower extremities    both hips  . Tobacco abuse 09/27/2015  . 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 ABDOMINAL HYSTERECTOMY  02/06/2009   Robotic total laparoscopic hysterectomy  . ANTERIOR CRUCIATE LIGAMENT REPAIR  1993  .  CARDIAC CATHETERIZATION  June 2015   no CAD - moderate AS noted  . 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 COLPOSCOPY  10/2008   CIN I & II  . COLPOSCOPY  07/2000   Neg. ECC  . COLPOSCOPY  06/2001   CIN I  . COLPOSCOPY  08/2004   ECC--atypia  . COLPOSCOPY N/A 01/21/2016   Procedure: COLPOSCOPY with vaginal biopsy with CO 2 Laser of Vaginal and vulvar condyloma;  Surgeon: [name], MD;  Location: [facility];  Service: Gynecology;  Laterality: N/A;  [name] will be here 2/21 for 1115 case confirmed 01/16/15 - TS  . HERNIA REPAIR  1981/1982  . HIP SURGERY  1981   Hip Reset   . HIP SURGERY  1990   Plate was reconstructed/ took out growth plate in Right knee  . HIP SURGERY  1992   Plate removed in Left hip  . LEFT AND RIGHT HEART CATHETERIZATION WITH CORONARY ANGIOGRAM N/A 05/18/2014   Procedure: LEFT AND RIGHT HEART CATHETERIZATION WITH CORONARY ANGIOGRAM;  Surgeon: Jettie Booze, MD;  Location: [facility] C[facility] LAB;  Service: Cardiovascular;  Laterality: N/A;  . LYMPH NODE BIOPSY  1995  .  NASAL SEPTUM SURGERY  2002  . TONSILLECTOMY AND ADENOIDECTOMY  1990  . TYMPANOSTOMY TUBE PLACEMENT  1981/1982    Current Outpatient Prescriptions  Medication Sig Dispense Refill  . albuterol (PROVENTIL HFA;VENTOLIN HFA) 108 (90 Base) MCG/ACT inhaler Inhale 2 puffs into the lungs every 6 (six) hours as needed for wheezing or shortness of breath. 1 Inhaler 0  . busPIRone (BUSPAR) 30 MG tablet Take 1 tablet (30 mg total) by mouth 2 (two) times daily at 10 AM and 5 PM. 60 tablet 1  . CALCIUM PO Take 2 tablets by mouth daily.    . cetirizine (ZYRTEC ALLERGY) 10 MG tablet Take 1 tablet (10 mg total) by mouth daily. 30 tablet 1  . fluticasone (FLONASE) 50 MCG/ACT nasal spray Place 2 sprays into both nostrils daily. 16 g 0  . gabapentin (NEURONTIN) 300 MG capsule TAKE 2 CAPSULES BY MOUTH THREE TIMES A DAY 360 capsule 1  . guaiFENesin (MUCINEX) 600 MG 12 hr  tablet Take 1 tablet (600 mg total) by mouth 2 (two) times daily as needed for cough or to loosen phlegm. 14 tablet 0  . lamoTRIgine (LAMICTAL) 200 MG tablet TAKE 2 TABLETS (400 MG TOTAL) BY MOUTH DAILY. 180 tablet 2  . Lansoprazole (PREVACID PO) Take 1 tablet by mouth daily as needed (indigestion).    Marland Kitchen losartan (COZAAR) 50 MG tablet TAKE ONE TABLET BY MOUTH DAILY *NEEDS APPOINTMENT WITH MD FOR FURTHER REFILLS* 15 tablet 0  . Multiple Vitamin (MULTIVITAMIN WITH MINERALS) TABS tablet Take 1 tablet by mouth daily.    . promethazine-dextromethorphan (PROMETHAZINE-DM) 6.25-15 MG/5ML syrup Take 5 mLs by mouth 3 (three) times daily as needed for cough. 120 mL 0  . traMADol (ULTRAM) 50 MG tablet TAKE 2 TABLETS BY MOUTH EVERY 12 HOURS AS NEEDED 180 tablet 0   No current facility-administered medications for this visit.     Family History  Problem Relation Age of Onset  . Hyperlipidemia Mother   . Hypertension Mother   . Thyroid disease Mother   . Hyperlipidemia Father   . Hypertension Father   . Migraines Father   . Hypertension Brother   . Hyperlipidemia Brother   . Thyroid disease Maternal Grandmother   . Thyroid disease Maternal Grandfather   . Cancer Neg Hx     ROS:  Pertinent items are noted in HPI.  Otherwise, a comprehensive ROS was negative.  Exam:   BP 124/70 (BP Location: Right Arm, Patient Position: Sitting, Cuff Size: Normal)   Pulse 64   Resp 16   Ht 4' 11.75" (1.518 m)   Wt 118 lb (53.5 kg)   BMI 23.24 kg/m     General appearance: alert, cooperative and appears stated age Head: Normocephalic, without obvious abnormality, atraumatic Neck: no adenopathy, supple, symmetrical, trachea midline and thyroid normal to inspection and palpation Lungs: clear to auscultation bilaterally Breasts: normal appearance, no masses or tenderness, No nipple retraction or dimpling, No nipple discharge or bleeding, No axillary or supraclavicular adenopathy Heart: Obvious systolic murmur,  heart is regular rate and rhythm Abdomen: soft, non-tender; no masses, no organomegaly Extremities: extremities normal, atraumatic, no cyanosis or edema Skin: Skin color, texture, turgor normal. No rashes or lesions Lymph nodes: Cervical, supraclavicular, and axillary nodes normal. No abnormal inguinal nodes palpated Neurologic: Grossly normal  Pelvic: External genitalia:   Scarring consistent with prior tx of multiple condyloma.  Has 2 cm brown flat nevus of right mons (old).  Left superior labia between majora and minora 7 mm area  slightly raised and soft, left labia minora with 5 mm area slightly raised.              Urethra:  normal appearing urethra with no masses, tenderness or lesions              Bartholins and Skenes: normal                 Vagina: normal appearing vagina with normal color and discharge, no lesions              Cervix: absent.               Pap taken: Yes.   Bimanual Exam:  Uterus:   Absent.               Adnexa: no mass, fullness, tenderness           Chaperone was present for exam.  Assessment:   Well woman visit with normal exam. Status post robotic hysterectomy for cervical dysplasia.  Ovaries remain.  Hx VAIN II and vulvar condyloma.  Has a few condyloma today.  I am not compelled to treat.  This is such a significant improvement from previously.  Smoker.  Working on cutting down.   Plan: Mammogram screening discussed. Recommended self breast awareness. Pap and HR HPV as above. Guidelines for Calcium, Vitamin D, regular exercise program including cardiovascular and weight bearing exercise. Full STD testing.  I did recommend her partner see his doctor to have further evaluation and treatment for his hep C status. Routine labs with her PCP.  We have talked about the importance of smoking cessation.  Follow up annually and prn.   After visit summary provided.

## 2017-05-17 ENCOUNTER — Encounter: Payer: Self-pay | Admitting: Obstetrics and Gynecology

## 2017-05-17 ENCOUNTER — Other Ambulatory Visit (HOSPITAL_COMMUNITY)
Admission: RE | Admit: 2017-05-17 | Discharge: 2017-05-17 | Disposition: A | Discharge status: Home / Self Care | Payer: Managed Care, Other (non HMO) | Source: Ambulatory Visit | Attending: Obstetrics and Gynecology | Admitting: Obstetrics and Gynecology

## 2017-05-17 ENCOUNTER — Ambulatory Visit (INDEPENDENT_AMBULATORY_CARE_PROVIDER_SITE_OTHER): Payer: Managed Care, Other (non HMO) | Admitting: Obstetrics and Gynecology

## 2017-05-17 VITALS — BP 124/70 | HR 64 | Resp 16 | Ht 59.75 in | Wt 118.0 lb

## 2017-05-17 DIAGNOSIS — Z113 Encounter for screening for infections with a predominantly sexual mode of transmission: Secondary | ICD-10-CM

## 2017-05-17 DIAGNOSIS — Z01419 Encounter for gynecological examination (general) (routine) without abnormal findings: Secondary | ICD-10-CM

## 2017-05-17 NOTE — Patient Instructions (Signed)

## 2017-05-18 LAB — HEPATITIS C ANTIBODY

## 2017-05-18 LAB — HEP, RPR, HIV PANEL
HIV Screen 4th Generation wRfx: NONREACTIVE
Hepatitis B Surface Ag: NEGATIVE
RPR: NONREACTIVE

## 2017-05-20 LAB — CYTOLOGY - PAP
Chlamydia: NEGATIVE
Diagnosis: NEGATIVE
HPV: DETECTED
Neisseria Gonorrhea: NEGATIVE
Trichomonas: NEGATIVE

## 2017-05-25 ENCOUNTER — Other Ambulatory Visit: Payer: Self-pay

## 2017-05-25 ENCOUNTER — Other Ambulatory Visit: Payer: Self-pay | Admitting: *Deleted

## 2017-05-25 MED ORDER — METRONIDAZOLE 500 MG PO TABS: 500.0000 mg | ORAL_TABLET | Freq: Two times a day (BID) | ORAL | 0 refills | Status: DC

## 2017-05-25 NOTE — Telephone Encounter (Signed)
Ok for Ativan 1 mg po one hour prior to procedure.  I agree with your recommendations.

## 2017-05-25 NOTE — Telephone Encounter (Signed)
Left message to call Kenzly Rogoff at [phone]. 

## 2017-05-25 NOTE — Telephone Encounter (Signed)
-----   Message from [name], MD sent at 05/22/2017  3:30 PM EDT ----- Please contact patient with results.  Her testing is negative for HIV, syphilis, hepatitis B and C, trichomonas, and gonorrhea and chlamydia.  She did have signs of bacterial vaginosis, and I am recommending either Flagyl 500 mg po bid x 7 days or Metrogel pv at hs x 5 nights. ETOH precautions.   Her pap was normal but she tested positive for HR HPV. As she has a recent hx of VAIN, she will need a colposcopy with me.  Please send to precert and schedule this.   Of note, this result in EPIC did not come across with gold highlighting the way "abnormal" paps come back.   Danielle Obrien

## 2017-05-25 NOTE — Telephone Encounter (Signed)
Spoke with patient, advised as seen below per Dr. Quincy Simmonds. Rx for Flagyl po to verified pharmacy on file. Reviewed ETOH precautions. Hysterectomy for contraceptive. Advised patient would schedule 2 weeks out to allow for treatment of BV. Patient scheduled on 06/10/17 at 3pm with Dr. Quincy Simmonds for [place]. Advised to take Motrin 800 mg with food and water one hour before procedure. Brief explanation of colpo provided, questions answered.  Patient asking for Rx to help "relax" prior to procedure. Advised patient would need to come into [facility] prior to day of surgery to sign consent and will need a driver. Advised patient would review with Dr. Quincy Simmonds and return call. Advised patient Dr. Quincy Simmonds is out of the office today, will return on 6/27, response may not be immediate. Patient verbalizes understanding and is agreeable.   Dr. Quincy Simmonds, please advise on pre procedure RX for anxiety?    Cc: Lerry Liner, Theresia Lo

## 2017-05-26 ENCOUNTER — Other Ambulatory Visit: Payer: Self-pay | Admitting: *Deleted

## 2017-05-26 DIAGNOSIS — R87811 Vaginal high risk human papillomavirus (HPV) DNA test positive: Secondary | ICD-10-CM

## 2017-05-26 NOTE — Telephone Encounter (Signed)
Left message to call Sharetha Newson at [phone].  

## 2017-05-28 NOTE — Telephone Encounter (Signed)
Left message to call Floyd Lusignan at [phone].  

## 2017-06-05 ENCOUNTER — Other Ambulatory Visit: Payer: Self-pay | Admitting: Cardiology

## 2017-06-07 NOTE — Telephone Encounter (Signed)
No answer, voicemail did not pick up, unable to leave message.

## 2017-06-08 NOTE — Telephone Encounter (Signed)
Please try an alternative phone number.  Patient is likely expecting that we have the Ativan here at the office.  She will need to pick it up at her pharmacy prior to the appointment.  Her consent form also needs to be signed in advance.

## 2017-06-08 NOTE — Telephone Encounter (Signed)
Dr. Quincy Simmonds, I have attempted to reach patient x3 to advise on pre procedure ativan, no return call. Patient is scheduled for colpo on 06/11/17, ok to close encounter?

## 2017-06-08 NOTE — Telephone Encounter (Signed)
Spoke with patient, advised as seen below per Dr. Quincy Simmonds. Patient aware she will need a driver day of procedure if taking ativan. Patient will plan to come to [facility] tomorrow to sign consent for colposcopy, aware to ask for triage nurse. Advised patient Dr. Quincy Simmonds is out of the office today, will fax RX for Ativan once signed on 7/11 to [facility] on file. Patient verbalizes understanding and is agreeable.   Rx for Ativan pending, will print for signature on 06/09/17.    Dr. Quincy Simmonds -routing [place].

## 2017-06-09 MED ORDER — LORAZEPAM 1 MG PO TABS: 1.0000 mg | ORAL_TABLET | Freq: Once | ORAL | 0 refills | Status: AC

## 2017-06-09 NOTE — Telephone Encounter (Signed)
Patient stopped by to sign consent form form for procedure tomorrow, no consent form in patient drawer. Please call patient when ready and she will come back to the office to sign.

## 2017-06-09 NOTE — Telephone Encounter (Signed)
Rx printed and to [place] for signature.

## 2017-06-09 NOTE — Telephone Encounter (Signed)
Rx signed by Dr.Silva. Left message for patient consent and rx are ready and that she may come by the office at her convenience. Will need to ask for triage nurse.

## 2017-06-09 NOTE — Telephone Encounter (Signed)
Patient came by the office. Consent form reviewed and signed by patient. Placed in records drawer up front until appointment. Rx given to patient. Patient states that she started her Flagyl medication late, is on the 4th day of medication. Appointment for colposcopy rescheduled to 06/24/2017 at 3 pm with Dr.Silva to allow completion of treatment for BV. This is first available appointment that will work with the patient's schedule. Patient is agreeable to date and time.  Routing to provider for final review. Patient agreeable to disposition. Will close encounter.

## 2017-06-10 ENCOUNTER — Ambulatory Visit: Payer: Self-pay | Admitting: Obstetrics and Gynecology

## 2017-06-20 ENCOUNTER — Other Ambulatory Visit: Payer: Self-pay | Admitting: Cardiology

## 2017-06-24 ENCOUNTER — Ambulatory Visit (INDEPENDENT_AMBULATORY_CARE_PROVIDER_SITE_OTHER): Payer: Managed Care, Other (non HMO) | Admitting: Obstetrics and Gynecology

## 2017-06-24 ENCOUNTER — Encounter: Payer: Self-pay | Admitting: Obstetrics and Gynecology

## 2017-06-24 DIAGNOSIS — R87811 Vaginal high risk human papillomavirus (HPV) DNA test positive: Secondary | ICD-10-CM

## 2017-06-24 NOTE — Progress Notes (Signed)
Subjective:     Patient ID: Danielle Obrien, female   DOB: [birth year], 36 y.o.   MRN: [id number]  HPI Pap History: 05/17/17 Pap smear Negative; HR HPV detected  09-18-15 LSIL:Pos HR HPV;colpo/vulvar bx  10-11-15--VAIN II and condyloma of vulva.  Review of Systems LMP: years ago Contraception: Hysterectomy for high grade dysplasia.  Took tramadol and Buspar for the visit today.  States she lost the Ativan.     Objective:   Physical Exam  Genitourinary:     Consent for procedure.  3% acetic acid used.  No acetowhite lesions.  Lugol's solution placed.  Decreased uptake at vaginal apex and right vaginal sidewall.  Biopsies of each taken.  No bleeding.  No complications.     Assessment:     Positive HR HPV.  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  Status post robotic hysterectomy and vaginal laser treatment of dysplasia/vulvar condyloma.    Plan:     Follow up vaginal biopsies.  Plan for yearly pap and HR HPV testing.    After visit summary to patient.

## 2017-06-24 NOTE — Patient Instructions (Signed)

## 2017-06-27 ENCOUNTER — Other Ambulatory Visit: Payer: Self-pay | Admitting: Nurse Practitioner

## 2017-06-27 DIAGNOSIS — F411 Generalized anxiety disorder: Secondary | ICD-10-CM

## 2017-06-28 NOTE — Telephone Encounter (Signed)
Pt need to keep 07/01/2017 appt for future refills.

## 2017-07-01 ENCOUNTER — Ambulatory Visit (INDEPENDENT_AMBULATORY_CARE_PROVIDER_SITE_OTHER)
Admission: RE | Admit: 2017-07-01 | Discharge: 2017-07-01 | Disposition: A | Discharge status: Home / Self Care | Payer: Managed Care, Other (non HMO) | Source: Ambulatory Visit | Attending: Nurse Practitioner | Admitting: Nurse Practitioner

## 2017-07-01 ENCOUNTER — Ambulatory Visit (INDEPENDENT_AMBULATORY_CARE_PROVIDER_SITE_OTHER): Payer: Managed Care, Other (non HMO) | Admitting: Nurse Practitioner

## 2017-07-01 ENCOUNTER — Encounter: Payer: Self-pay | Admitting: Nurse Practitioner

## 2017-07-01 VITALS — BP 120/88 | HR 72 | Temp 97.8°F | Ht 59.75 in | Wt 119.0 lb

## 2017-07-01 DIAGNOSIS — J189 Pneumonia, unspecified organism: Secondary | ICD-10-CM

## 2017-07-01 DIAGNOSIS — I10 Essential (primary) hypertension: Secondary | ICD-10-CM

## 2017-07-01 DIAGNOSIS — J4530 Mild persistent asthma, uncomplicated: Secondary | ICD-10-CM

## 2017-07-01 DIAGNOSIS — E785 Hyperlipidemia, unspecified: Secondary | ICD-10-CM | POA: Diagnosis not present

## 2017-07-01 DIAGNOSIS — J181 Lobar pneumonia, unspecified organism: Secondary | ICD-10-CM | POA: Diagnosis not present

## 2017-07-01 DIAGNOSIS — F3177 Bipolar disorder, in partial remission, most recent episode mixed: Secondary | ICD-10-CM | POA: Diagnosis not present

## 2017-07-01 DIAGNOSIS — Z0000 Encounter for general adult medical examination without abnormal findings: Secondary | ICD-10-CM

## 2017-07-01 DIAGNOSIS — Z0001 Encounter for general adult medical examination with abnormal findings: Secondary | ICD-10-CM | POA: Diagnosis not present

## 2017-07-01 DIAGNOSIS — F411 Generalized anxiety disorder: Secondary | ICD-10-CM | POA: Diagnosis not present

## 2017-07-01 MED ORDER — LOSARTAN POTASSIUM 50 MG PO TABS: 50.0000 mg | ORAL_TABLET | Freq: Every day | ORAL | 3 refills | Status: DC

## 2017-07-01 MED ORDER — LAMOTRIGINE 200 MG PO TABS: ORAL_TABLET | ORAL | 2 refills | Status: DC

## 2017-07-01 MED ORDER — BUSPIRONE HCL 30 MG PO TABS: ORAL_TABLET | ORAL | 3 refills | Status: DC

## 2017-07-01 MED ORDER — BUDESONIDE-FORMOTEROL FUMARATE 160-4.5 MCG/ACT IN AERO: 2.0000 | INHALATION_SPRAY | Freq: Two times a day (BID) | RESPIRATORY_TRACT | 5 refills | Status: DC

## 2017-07-01 NOTE — Patient Instructions (Addendum)
Stop tobacco use. Pneumonia has resolved.  Return to lab tomorrow or Monday (fasting) for lipid panel.   maintain upcoming appt with psychiatrist.  Health Maintenance, Female Adopting a healthy lifestyle and getting preventive care can go a long way to promote health and wellness. Talk with your health care provider about what schedule of regular examinations is right for you. This is a good chance for you to check in with your provider about disease prevention and staying healthy. In between checkups, there are plenty of things you can do on your own. Experts have done a lot of research about which lifestyle changes and preventive measures are most likely to keep you healthy. Ask your health care provider for more information. Weight and diet Eat a healthy diet  Be sure to include plenty of vegetables, fruits, low-fat dairy products, and lean protein.  Do not eat a lot of foods high in solid fats, added sugars, or salt.  Get regular exercise. This is one of the most important things you can do for your health. ? Most adults should exercise for at least 150 minutes each week. The exercise should increase your heart rate and make you sweat (moderate-intensity exercise). ? Most adults should also do strengthening exercises at least twice a week. This is in addition to the moderate-intensity exercise.  Maintain a healthy weight  Body mass index (BMI) is a measurement that can be used to identify possible weight problems. It estimates body fat based on height and weight. Your health care provider can help determine your BMI and help you achieve or maintain a healthy weight.  For females 65 years of age and older: ? A BMI below 18.5 is considered underweight. ? A BMI of 18.5 to 24.9 is normal. ? A BMI of 25 to 29.9 is considered overweight. ? A BMI of 30 and above is considered obese.  Watch levels of cholesterol and blood lipids  You should start having your blood tested for lipids and  cholesterol at 37 years of age, then have this test every 5 years.  You may need to have your cholesterol levels checked more often if: ? Your lipid or cholesterol levels are high. ? You are older than 37 years of age. ? You are at high risk for heart disease.  Cancer screening Lung Cancer  Lung cancer screening is recommended for adults 84-81 years old who are at high risk for lung cancer because of a history of smoking.  A yearly low-dose CT scan of the lungs is recommended for people who: ? Currently smoke. ? Have quit within the past 15 years. ? Have at least a 30-pack-year history of smoking. A pack year is smoking an average of one pack of cigarettes a day for 1 year.  Yearly screening should continue until it has been 15 years since you quit.  Yearly screening should stop if you develop a health problem that would prevent you from having lung cancer treatment.  Breast Cancer  Practice breast self-awareness. This means understanding how your breasts normally appear and feel.  It also means doing regular breast self-exams. Let your health care provider know about any changes, no matter how small.  If you are in your 20s or 30s, you should have a clinical breast exam (CBE) by a health care provider every 1-3 years as part of a regular health exam.  If you are 47 or older, have a CBE every year. Also consider having a breast X-ray (mammogram) every year.  If you have a family history of breast cancer, talk to your health care provider about genetic screening.  If you are at high risk for breast cancer, talk to your health care provider about having an MRI and a mammogram every year.  Breast cancer gene (BRCA) assessment is recommended for women who have family members with BRCA-related cancers. BRCA-related cancers include: ? Breast. ? Ovarian. ? Tubal. ? Peritoneal cancers.  Results of the assessment will determine the need for genetic counseling and BRCA1 and BRCA2  testing.  Cervical Cancer Your health care provider may recommend that you be screened regularly for cancer of the pelvic organs (ovaries, uterus, and vagina). This screening involves a pelvic examination, including checking for microscopic changes to the surface of your cervix (Pap test). You may be encouraged to have this screening done every 3 years, beginning at age 21.  For women ages 30-65, health care providers may recommend pelvic exams and Pap testing every 3 years, or they may recommend the Pap and pelvic exam, combined with testing for human papilloma virus (HPV), every 5 years. Some types of HPV increase your risk of cervical cancer. Testing for HPV may also be done on women of any age with unclear Pap test results.  Other health care providers may not recommend any screening for nonpregnant women who are considered low risk for pelvic cancer and who do not have symptoms. Ask your health care provider if a screening pelvic exam is right for you.  If you have had past treatment for cervical cancer or a condition that could lead to cancer, you need Pap tests and screening for cancer for at least 20 years after your treatment. If Pap tests have been discontinued, your risk factors (such as having a new sexual partner) need to be reassessed to determine if screening should resume. Some women have medical problems that increase the chance of getting cervical cancer. In these cases, your health care provider may recommend more frequent screening and Pap tests.  Colorectal Cancer  This type of cancer can be detected and often prevented.  Routine colorectal cancer screening usually begins at 37 years of age and continues through 37 years of age.  Your health care provider may recommend screening at an earlier age if you have risk factors for colon cancer.  Your health care provider may also recommend using home test kits to check for hidden blood in the stool.  A small camera at the end of a  tube can be used to examine your colon directly (sigmoidoscopy or colonoscopy). This is done to check for the earliest forms of colorectal cancer.  Routine screening usually begins at age 50.  Direct examination of the colon should be repeated every 5-10 years through 37 years of age. However, you may need to be screened more often if early forms of precancerous polyps or small growths are found.  Skin Cancer  Check your skin from head to toe regularly.  Tell your health care provider about any new moles or changes in moles, especially if there is a change in a mole's shape or color.  Also tell your health care provider if you have a mole that is larger than the size of a pencil eraser.  Always use sunscreen. Apply sunscreen liberally and repeatedly throughout the day.  Protect yourself by wearing long sleeves, pants, a wide-brimmed hat, and sunglasses whenever you are outside.  Heart disease, diabetes, and high blood pressure  High blood pressure causes heart disease and   increases the risk of stroke. High blood pressure is more likely to develop in: ? People who have blood pressure in the high end of the normal range (130-139/85-89 mm Hg). ? People who are overweight or obese. ? People who are African American.  If you are 18-39 years of age, have your blood pressure checked every 3-5 years. If you are 40 years of age or older, have your blood pressure checked every year. You should have your blood pressure measured twice-once when you are at a hospital or clinic, and once when you are not at a hospital or clinic. Record the average of the two measurements. To check your blood pressure when you are not at a hospital or clinic, you can use: ? An automated blood pressure machine at a pharmacy. ? A home blood pressure monitor.  If you are between 55 years and 79 years old, ask your health care provider if you should take aspirin to prevent strokes.  Have regular diabetes screenings. This  involves taking a blood sample to check your fasting blood sugar level. ? If you are at a normal weight and have a low risk for diabetes, have this test once every three years after 37 years of age. ? If you are overweight and have a high risk for diabetes, consider being tested at a younger age or more often. Preventing infection Hepatitis B  If you have a higher risk for hepatitis B, you should be screened for this virus. You are considered at high risk for hepatitis B if: ? You were born in a country where hepatitis B is common. Ask your health care provider which countries are considered high risk. ? Your parents were born in a high-risk country, and you have not been immunized against hepatitis B (hepatitis B vaccine). ? You have HIV or AIDS. ? You use needles to inject street drugs. ? You live with someone who has hepatitis B. ? You have had sex with someone who has hepatitis B. ? You get hemodialysis treatment. ? You take certain medicines for conditions, including cancer, organ transplantation, and autoimmune conditions.  Hepatitis C  Blood testing is recommended for: ? Everyone born from 1945 through 1965. ? Anyone with known risk factors for hepatitis C.  Sexually transmitted infections (STIs)  You should be screened for sexually transmitted infections (STIs) including gonorrhea and chlamydia if: ? You are sexually active and are younger than 37 years of age. ? You are older than 37 years of age and your health care provider tells you that you are at risk for this type of infection. ? Your sexual activity has changed since you were last screened and you are at an increased risk for chlamydia or gonorrhea. Ask your health care provider if you are at risk.  If you do not have HIV, but are at risk, it may be recommended that you take a prescription medicine daily to prevent HIV infection. This is called pre-exposure prophylaxis (PrEP). You are considered at risk if: ? You are  sexually active and do not regularly use condoms or know the HIV status of your partner(s). ? You take drugs by injection. ? You are sexually active with a partner who has HIV.  Talk with your health care provider about whether you are at high risk of being infected with HIV. If you choose to begin PrEP, you should first be tested for HIV. You should then be tested every 3 months for as long as you are taking   PrEP. Pregnancy  If you are premenopausal and you may become pregnant, ask your health care provider about preconception counseling.  If you may become pregnant, take 400 to 800 micrograms (mcg) of folic acid every day.  If you want to prevent pregnancy, talk to your health care provider about birth control (contraception). Osteoporosis and menopause  Osteoporosis is a disease in which the bones lose minerals and strength with aging. This can result in serious bone fractures. Your risk for osteoporosis can be identified using a bone density scan.  If you are 104 years of age or older, or if you are at risk for osteoporosis and fractures, ask your health care provider if you should be screened.  Ask your health care provider whether you should take a calcium or vitamin D supplement to lower your risk for osteoporosis.  Menopause may have certain physical symptoms and risks.  Hormone replacement therapy may reduce some of these symptoms and risks. Talk to your health care provider about whether hormone replacement therapy is right for you. Follow these instructions at home:  Schedule regular health, dental, and eye exams.  Stay current with your immunizations.  Do not use any tobacco products including cigarettes, chewing tobacco, or electronic cigarettes.  If you are pregnant, do not drink alcohol.  If you are breastfeeding, limit how much and how often you drink alcohol.  Limit alcohol intake to no more than 1 drink per day for nonpregnant women. One drink equals 12 ounces of  beer, 5 ounces of wine, or 1 ounces of hard liquor.  Do not use street drugs.  Do not share needles.  Ask your health care provider for help if you need support or information about quitting drugs.  Tell your health care provider if you often feel depressed.  Tell your health care provider if you have ever been abused or do not feel safe at home. This information is not intended to replace advice given to you by your health care provider. Make sure you discuss any questions you have with your health care provider. Document Released: 06/01/2011 Document Revised: 04/23/2016 Document Reviewed: 08/20/2015 Elsevier Interactive Patient Education  [employer].

## 2017-07-01 NOTE — Progress Notes (Signed)
Subjective:  Patient ID: Danielle Obrien, female    DOB: 1981-03-07  Age: 36 y.o. MRN: [id number]  CC: Follow-up (still coughing with little yellow mucus--keep pt up at night--)   HPI  HTN: Stable. Needs losartan refill.  Depression and anxiety: Mood is stable with buspar and lamictal. Has appt with psychiatry next month per patient. Denies any SI or HI.  Depression screen PHQ 2/9 07/01/2017  Decreased Interest 2  Down, Depressed, Hopeless 2  PHQ - 2 Score 4  Altered sleeping 1  Tired, decreased energy 1  Change in appetite 2  Feeling bad or failure about yourself  1  Trouble concentrating 1  Moving slowly or fidgety/restless 1  Suicidal thoughts 0  PHQ-9 Score 11  Difficult doing work/chores Somewhat difficult   COPD: Need repeat CXR to re eval RUL pneumonia. Persistent tobacco use. Not interested in quitting at this time. Persistent cough (productive), no fever. No SOB  Vision: needed, no insurance.  Dental: needed, no insurance.  Medication list reviewed: Outpatient Medications Prior to Visit  Medication Sig Dispense Refill  . albuterol (PROVENTIL HFA;VENTOLIN HFA) 108 (90 Base) MCG/ACT inhaler Inhale 2 puffs into the lungs every 6 (six) hours as needed for wheezing or shortness of breath. 1 Inhaler 0  . CALCIUM PO Take 2 tablets by mouth daily.    . fluticasone (FLONASE) 50 MCG/ACT nasal spray Place 2 sprays into both nostrils daily. 16 g 0  . gabapentin (NEURONTIN) 300 MG capsule TAKE 2 CAPSULES BY MOUTH THREE TIMES A DAY 360 capsule 1  . Lansoprazole (PREVACID PO) Take 1 tablet by mouth daily as needed (indigestion).    . Multiple Vitamin (MULTIVITAMIN WITH MINERALS) TABS tablet Take 1 tablet by mouth daily.    . traMADol (ULTRAM) 50 MG tablet TAKE 2 TABLETS BY MOUTH EVERY 12 HOURS AS NEEDED 180 tablet 0  . busPIRone (BUSPAR) 30 MG tablet TAKE ONE TABLET BY MOUTH TWICE DAILY AT 10AM AND 5PM 60 tablet 0  . lamoTRIgine (LAMICTAL) 200 MG tablet TAKE 2 TABLETS  (400 MG TOTAL) BY MOUTH DAILY. 180 tablet 2  . losartan (COZAAR) 50 MG tablet TAKE 1 TABLET BY MOUTH DAILY  (NEEDS APPOINTMENT WITH DOCTOR FOR FURTHER REFILLS) 15 tablet 0  . guaiFENesin (MUCINEX) 600 MG 12 hr tablet Take 1 tablet (600 mg total) by mouth 2 (two) times daily as needed for cough or to loosen phlegm. (Patient not taking: Reported on 07/01/2017) 14 tablet 0   No facility-administered medications prior to visit.    Past Medical History:  Diagnosis Date  . Abnormal Pap smear of cervix    --recurrent ascus w/Pos. HR HPV  . ADD (attention deficit disorder)   . Alcohol abuse, in remission 2012  . Allergy   . Anxiety   . Aortic stenosis   . Aortic stenosis due to bicuspid aortic valve 02/20/2014  . Arthritis    In hips  . Asthma    rare inhaler use  . Bicuspid aortic valve    moderate AS by echo 08/2015 with mean AVG 21mmHg and AVA 1.1cm2  . Bipolar disorder ([place])   . Headache   . Hyperlipidemia with target LDL less than 130 01/27/2013  . Hypertension   . Muscle spasms of both lower extremities    both hips  . Tobacco abuse 09/27/2015  . 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  Hyperlipidemia Mother   . Hypertension Mother   . Thyroid disease Mother   . Hyperlipidemia Father   . Hypertension Father   . Migraines Father   . Hypertension Brother   . Hyperlipidemia Brother   . Thyroid disease Maternal Grandmother   . Thyroid disease Maternal Grandfather   . Cancer Neg Hx    Social History   Social History  . Marital status: Single    Spouse name: N/A  . Number of children: N/A  . Years of education: N/A   Social History Main Topics  . Smoking status: Current Every Day Smoker    Packs/day: 0.50    Years: 12.00    Types: Cigarettes  . Smokeless tobacco: Never Used  . Alcohol use No  . Drug use: No  . Sexual activity: Yes    Partners: Male    Birth  control/ protection: Surgical     Comment: Hysterectomy   Other Topics Concern  . None   Social History Narrative  . None   ROS Review of Systems  HENT: Negative.   Respiratory: Negative.   Cardiovascular: Negative.   Gastrointestinal: Negative.   Genitourinary: Negative.   Musculoskeletal: Negative.   Skin: Negative.   Neurological: Negative.   Psychiatric/Behavioral: Positive for depression. Negative for hallucinations, memory loss, substance abuse and suicidal ideas. The patient is nervous/anxious and has insomnia.    Objective:  BP 120/88   Pulse 72   Temp 97.8 F (36.6 C)   Ht 4' 11.75" (1.518 m)   Wt 119 lb (54 kg)   SpO2 98%   BMI 23.44 kg/m   BP Readings from Last 3 Encounters:  07/01/17 120/88  06/24/17 118/78  05/17/17 124/70    Wt Readings from Last 3 Encounters:  07/01/17 119 lb (54 kg)  06/24/17 112 lb 9.6 oz (51.1 kg)  05/17/17 118 lb (53.5 kg)    Physical Exam  Constitutional: She is oriented to person, place, and time.  Cardiovascular: Normal rate.   Murmur heard. Pulmonary/Chest: Effort normal and breath sounds normal. She has no wheezes. She has no rales.  Musculoskeletal: She exhibits no edema.  Neurological: She is alert and oriented to person, place, and time.  Skin: Skin is warm and dry.  Psychiatric: She has a normal mood and affect. Her behavior is normal. Thought content normal.  Vitals reviewed.   Lab Results  Component Value Date   WBC 9.6 04/27/2017   HGB 14.4 04/27/2017   HCT 43.1 04/27/2017   PLT 146 (L) 04/27/2017   GLUCOSE 116 (H) 04/27/2017   CHOL 254 (H) 08/20/2016   TRIG 67.0 08/20/2016   HDL 79.00 08/20/2016   LDLDIRECT 171.0 01/27/2013   LDLCALC 162 (H) 08/20/2016   ALT 19 01/14/2016   AST 19 01/14/2016   NA 135 04/27/2017   K 3.6 04/27/2017   CL 102 04/27/2017   CREATININE 0.90 04/27/2017   BUN 16 04/27/2017   CO2 22 04/27/2017   TSH 0.64 01/14/2016   INR 0.9 05/15/2014    Assessment & Plan:    Bobetta was seen today for follow-up.  Diagnoses and all orders for this visit:  Encounter for preventative adult health care examination  Hyperlipidemia with target LDL less than 130 -     Lipid panel; Future  Anxiety state -     busPIRone (BUSPAR) 30 MG tablet; TAKE ONE TABLET BY MOUTH TWICE DAILY AT 10AM AND 5PM -     lamoTRIgine (LAMICTAL) 200 MG tablet; TAKE 2 TABLETS (  400 MG TOTAL) BY MOUTH DAILY.  Essential hypertension, benign -     losartan (COZAAR) 50 MG tablet; Take 1 tablet (50 mg total) by mouth daily.  Community acquired pneumonia of right upper lobe of lung ([place]) -     DG Chest 2 View; Future  Bipolar disorder, in partial remission, most recent episode mixed (HCC) -     lamoTRIgine (LAMICTAL) 200 MG tablet; TAKE 2 TABLETS (400 MG TOTAL) BY MOUTH DAILY.  Mild persistent asthma, unspecified whether complicated -     budesonide-formoterol (SYMBICORT) 160-4.5 MCG/ACT inhaler; Inhale 2 puffs into the lungs 2 (two) times daily. Rinse mouthe after each use   I have changed Ms. Nauman's losartan. I am also having her start on budesonide-formoterol. Additionally, I am having her maintain her CALCIUM PO, Lansoprazole (PREVACID PO), multivitamin with minerals, guaiFENesin, albuterol, fluticasone, gabapentin, traMADol, busPIRone, and lamoTRIgine.  Meds ordered this encounter  Medications  . busPIRone (BUSPAR) 30 MG tablet    Sig: TAKE ONE TABLET BY MOUTH TWICE DAILY AT 10AM AND 5PM    Dispense:  60 tablet    Refill:  3    Order Specific Question:   Supervising Provider    Answer:   Cassandria Anger [[id number]]  . lamoTRIgine (LAMICTAL) 200 MG tablet    Sig: TAKE 2 TABLETS (400 MG TOTAL) BY MOUTH DAILY.    Dispense:  180 tablet    Refill:  2    Order Specific Question:   Supervising Provider    Answer:   Cassandria Anger [[id number]]  . losartan (COZAAR) 50 MG tablet    Sig: Take 1 tablet (50 mg total) by mouth daily.    Dispense:  90 tablet    Refill:  3    Order  Specific Question:   Supervising Provider    Answer:   Cassandria Anger [[id number]]  . budesonide-formoterol (SYMBICORT) 160-4.5 MCG/ACT inhaler    Sig: Inhale 2 puffs into the lungs 2 (two) times daily. Rinse mouthe after each use    Dispense:  1 Inhaler    Refill:  5    Order Specific Question:   Supervising Provider    Answer:   Cassandria Anger [[id number]]    Follow-up: Return in about 6 months (around 01/01/2018).  Wilfred Lacy, NP

## 2017-07-05 ENCOUNTER — Telehealth: Payer: Self-pay

## 2017-07-05 NOTE — Telephone Encounter (Signed)
Called patient at [phone] to discuss colposcopy results, LMOVM to call me back.

## 2017-07-05 NOTE — Telephone Encounter (Signed)
-----   Message from [name], MD sent at 07/04/2017 12:45 PM EDT ----- Please report results of colposcopy which showed VAIN I, low grade dysplasia of the vagina.  This does not need treatment.  I am recommending pap and HR HPV testing in one year.  Recall - 08 please.

## 2017-07-06 ENCOUNTER — Telehealth: Payer: Self-pay | Admitting: Family Medicine

## 2017-07-06 NOTE — Telephone Encounter (Signed)
Pt called stating her pharmacy has tried for a week to get a refill of her traMADol (ULTRAM) 50 MG tablet  She would like a refill Same pharmacy  Please call back

## 2017-07-07 ENCOUNTER — Other Ambulatory Visit: Payer: Self-pay | Admitting: *Deleted

## 2017-07-07 ENCOUNTER — Encounter: Payer: Self-pay | Admitting: Nurse Practitioner

## 2017-07-07 MED ORDER — TRAMADOL HCL 50 MG PO TABS: ORAL_TABLET | ORAL | 0 refills | Status: DC

## 2017-07-07 NOTE — Telephone Encounter (Signed)
Pt scheduled appt.  Refill sent to pharmacy.

## 2017-07-09 NOTE — Telephone Encounter (Signed)
Called patient at [phone] per DPR, LMOVM to call me back.

## 2017-07-12 NOTE — Telephone Encounter (Signed)
Called patient at [phone] LMOVM to call me back.

## 2017-07-20 ENCOUNTER — Ambulatory Visit: Payer: Managed Care, Other (non HMO) | Admitting: Family Medicine

## 2017-08-04 ENCOUNTER — Ambulatory Visit: Payer: Managed Care, Other (non HMO) | Admitting: Family Medicine

## 2017-08-04 NOTE — Telephone Encounter (Signed)
Called patient at [phone] LMOVM to call me. Letter also sent.

## 2017-08-11 NOTE — Telephone Encounter (Signed)
Dr.Silva, okay to close encounter? Patient has not responded to messages. 08 recall entered. Routed to Dr.Silva

## 2017-08-16 ENCOUNTER — Telehealth: Payer: Self-pay | Admitting: Obstetrics and Gynecology

## 2017-08-16 NOTE — Telephone Encounter (Signed)
Patient contacted by phone today and aware of results.  She is in 08 recall.  Encounter closed.

## 2017-08-16 NOTE — Telephone Encounter (Signed)
Patient said she received a letter to call regarding results.

## 2017-08-16 NOTE — Telephone Encounter (Signed)
Spoke with patient, advised of results as seen below per Dr. Quincy Simmonds. Patient declined to schedule AEX at this time. Patient verbalizes understanding and is agreeable.   08 recall previously placed.   Notes recorded by [name], MD on 07/04/2017 at 12:45 PM EDT Please report results of colposcopy which showed VAIN I, low grade dysplasia of the vagina.  This does not need treatment.  I am recommending pap and HR HPV testing in one year.  Recall - 08 please.   Routing to provider for final review. Patient is agreeable to disposition. Will close encounter.

## 2017-08-17 ENCOUNTER — Other Ambulatory Visit: Payer: Self-pay | Admitting: Family Medicine

## 2017-08-23 ENCOUNTER — Telehealth: Payer: Self-pay | Admitting: Family Medicine

## 2017-08-23 MED ORDER — TRAMADOL HCL 50 MG PO TABS: ORAL_TABLET | ORAL | 0 refills | Status: DC

## 2017-08-23 NOTE — Telephone Encounter (Signed)
Pt would like a call back regarding her Tramadol.

## 2017-08-23 NOTE — Telephone Encounter (Signed)
Spoke to pt, she stated that she needs a refill on her tramadol. She has an appt on 10.4.18. I advised pt we will giver her 1/2 refill & she must come in next week for her appt. Pt understood.

## 2017-08-29 ENCOUNTER — Other Ambulatory Visit: Payer: Self-pay | Admitting: Family Medicine

## 2017-08-30 NOTE — Telephone Encounter (Signed)
Refill done.  

## 2017-09-02 ENCOUNTER — Telehealth: Payer: Self-pay | Admitting: Cardiology

## 2017-09-02 ENCOUNTER — Ambulatory Visit: Payer: Managed Care, Other (non HMO) | Admitting: Family Medicine

## 2017-09-02 NOTE — Telephone Encounter (Signed)
°  New Prob  Pt would like to speak to RN regarding specialist she was sent to for aortic valve replacement. Please call.

## 2017-09-02 NOTE — Telephone Encounter (Signed)
Patient called to schedule an appointment for her pass due f/u appt and pass due echo appt per Dr. Guy Sandifer last office note. Patient scheduled to see Truitt Merle on 09/06/17 @1 :30 pm.

## 2017-09-04 NOTE — Progress Notes (Signed)
[name] [place], [place] [address] Phone: [phone] Subjective:    I'm seeing this patient by the request  of:    CC: Low back pain, chronic pain follow-up  ION:[id number]  Danielle Obrien is a 36 y.o. female coming in with complaint of low back pain.  Had significant leg length discrepancy. Has had chronic pain mostly of the sacroiliac joint. Has been on tramadol for quite some time. Using a intermittently. Patient states Continues to have discomfort and pain overall. Patient was found to have a bicuspid aortic valve and is going to have have replacement. Has failed an exercise stress stress previously. Continues to avoid any anti-inflammatories secondary to heart. Patient is continuing to contribute in the home 3-4 times daily. [place] with the Tylenol. Patient feels and needs custom orthotics again. Has had them for greater than 3 years. Starting to affect in noticed that that improved some of her back more than anything else we did.   revealing patient's chart in entirety found to have severe difficulty with her aortic valve and is looking at the potential for replacement.  Past Medical History:  Diagnosis Date  . Abnormal Pap smear of cervix    --recurrent ascus w/Pos. HR HPV  . ADD (attention deficit disorder)   . Alcohol abuse, in remission 2012  . Allergy   . Anxiety   . Aortic stenosis   . Aortic stenosis due to bicuspid aortic valve 02/20/2014  . Arthritis    In hips  . Asthma    rare inhaler use  . Bicuspid aortic valve    moderate AS by echo 08/2015 with mean AVG 62mmHg and AVA 1.1cm2  . Bipolar disorder ([place])   . Headache   . Hyperlipidemia with target LDL less than 130 01/27/2013  . Hypertension   . Muscle spasms of both lower extremities    both hips  . Tobacco abuse 09/27/2015  . 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 ABDOMINAL HYSTERECTOMY  02/06/2009   Robotic total laparoscopic hysterectomy  . ANTERIOR CRUCIATE LIGAMENT REPAIR  1993  . CARDIAC CATHETERIZATION  June 2015   no CAD - moderate AS noted  . 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 COLPOSCOPY  10/2008   CIN I & II  . COLPOSCOPY  07/2000   Neg. ECC  . COLPOSCOPY  06/2001   CIN I  . COLPOSCOPY  08/2004   ECC--atypia  . COLPOSCOPY N/A 01/21/2016   Procedure: COLPOSCOPY with vaginal biopsy with CO 2 Laser of Vaginal and vulvar condyloma;  Surgeon: [name], MD;  Location: [facility];  Service: Gynecology;  Laterality: N/A;  [name] will be here 2/21 for 1115 case confirmed 01/16/15 - TS  . HERNIA REPAIR  1981/1982  . HIP SURGERY  1981   Hip Reset   . HIP SURGERY  1990   Plate was reconstructed/ took out growth plate in Right knee  . HIP SURGERY  1992   Plate removed in Left hip  . LEFT AND RIGHT HEART CATHETERIZATION WITH CORONARY ANGIOGRAM N/A 05/18/2014   Procedure: LEFT AND RIGHT HEART CATHETERIZATION WITH CORONARY ANGIOGRAM;  Surgeon: Jettie Booze, MD;  Location: [facility] CATH LAB;  Service: Cardiovascular;  Laterality: N/A;  . LYMPH NODE BIOPSY  1995  .  NASAL SEPTUM SURGERY  2002  . TONSILLECTOMY AND ADENOIDECTOMY  1990  . TYMPANOSTOMY TUBE PLACEMENT  1981/1982   Social History   Social History  . Marital status: Single    Spouse name: N/A  . Number of children: N/A  . Years of education: N/A   Social History Main Topics  . Smoking status: Current Every Day Smoker    Packs/day: 0.50    Years: 12.00    Types: Cigarettes  . Smokeless tobacco: Never Used  . Alcohol use No  . Drug use: No  . Sexual activity: Yes    Partners: Male    Birth control/ protection: Surgical     Comment: Hysterectomy   Other Topics Concern  . None   Social History Narrative  . None   Allergies  Allergen Reactions  . Demerol Nausea And Vomiting  . Ibuprofen      Gastritis   . Morphine And Related Nausea And Vomiting  . Codeine Itching   Family History  Problem Relation Age of Onset  . Hyperlipidemia Mother   . Hypertension Mother   . Thyroid disease Mother   . Hyperlipidemia Father   . Hypertension Father   . Migraines Father   . Hypertension Brother   . Hyperlipidemia Brother   . Thyroid disease Maternal Grandmother   . Thyroid disease Maternal Grandfather   . Cancer Neg Hx      Past medical history, social, surgical and family history all reviewed in electronic medical record.  No pertanent information unless stated regarding to the chief complaint.   Review of Systems:Review of systems updated and as accurate as of 09/06/17  No headache, visual changes, nausea, vomiting, diarrhea, constipation, dizziness, abdominal pain, skin rash, fevers, chills, night sweats, weight loss, swollen lymph nodes, joint swelling, chest pain, shortness of breath, mood changes. Positive muscle aches, body aches  Objective  Blood pressure 122/82, pulse 72, height 5' (1.524 m), weight 118 lb (53.5 kg), SpO2 98 %. Systems examined below as of 09/06/17   General: No apparent distress alert and oriented x3 mood and affect normal, dressed appropriately.  HEENT: Pupils equal, extraocular movements intact  Respiratory: Patient's speak in full sentences and does not appear short of breath  Cardiovascular: No lower extremity edema, non tender, no erythema  Skin: Warm dry intact with no signs of infection or rash on extremities or on axial skeleton.  Abdomen: Soft nontender  Neuro: Cranial nerves II through XII are intact, neurovascularly intact in all extremities with 2+ DTRs and 2+ pulses.  Lymph: No lymphadenopathy of posterior or anterior cervical chain or axillae bilaterally.  Gait normal with good balance and coordination.  MSK:  Non tender with full range of motion and good stability and symmetric strength and tone of shoulders, elbows, wrist, hip, knee and  ankles bilaterally.  Back Exam:  Inspection: Mild degenerative scoliosis noted Motion: Flexion 45 deg, Extension 25 deg, Side Bending to 45 deg bilaterally,  Rotation to 45 deg bilaterally  SLR laying: Negative  XSLR laying: Negative  Palpable tenderness: Tender to palpation diffusely in the paraspinal musculature in the thoracolumbar area.Marland Kitchen FABER: negative. Sensory change: Gross sensation intact to all lumbar and sacral dermatomes.  Reflexes: 2+ at both patellar tendons, 2+ at achilles tendons, Babinski's downgoing.  Strength at foot  4+ out of 5 but symmetric  Foot exam shows the patient does have breakdown of the longitudinal and transverse arch bilaterally left greater than right bunion and bunionette formation bilaterally    Impression  and Recommendations:     This case required medical decision making of moderate complexity.      Note: This dictation was prepared with Dragon dictation along with smaller phrase technology. Any transcriptional errors that result from this process are unintentional.

## 2017-09-06 ENCOUNTER — Ambulatory Visit (INDEPENDENT_AMBULATORY_CARE_PROVIDER_SITE_OTHER): Payer: Managed Care, Other (non HMO) | Admitting: Nurse Practitioner

## 2017-09-06 ENCOUNTER — Encounter: Payer: Self-pay | Admitting: Family Medicine

## 2017-09-06 ENCOUNTER — Ambulatory Visit (INDEPENDENT_AMBULATORY_CARE_PROVIDER_SITE_OTHER): Payer: Managed Care, Other (non HMO) | Admitting: Family Medicine

## 2017-09-06 ENCOUNTER — Encounter: Payer: Self-pay | Admitting: Nurse Practitioner

## 2017-09-06 VITALS — BP 110/80 | HR 86 | Ht 60.0 in | Wt 117.8 lb

## 2017-09-06 DIAGNOSIS — M216X9 Other acquired deformities of unspecified foot: Secondary | ICD-10-CM | POA: Diagnosis not present

## 2017-09-06 DIAGNOSIS — R0600 Dyspnea, unspecified: Secondary | ICD-10-CM | POA: Diagnosis not present

## 2017-09-06 DIAGNOSIS — F1190 Opioid use, unspecified, uncomplicated: Secondary | ICD-10-CM | POA: Diagnosis not present

## 2017-09-06 DIAGNOSIS — R079 Chest pain, unspecified: Secondary | ICD-10-CM

## 2017-09-06 DIAGNOSIS — M533 Sacrococcygeal disorders, not elsewhere classified: Secondary | ICD-10-CM

## 2017-09-06 DIAGNOSIS — I350 Nonrheumatic aortic (valve) stenosis: Secondary | ICD-10-CM | POA: Diagnosis not present

## 2017-09-06 MED ORDER — TRAMADOL HCL 50 MG PO TABS: ORAL_TABLET | ORAL | 3 refills | Status: DC

## 2017-09-06 NOTE — Assessment & Plan Note (Signed)
Patient will be fitted for custom orthotics

## 2017-09-06 NOTE — Patient Instructions (Signed)
We will be checking the following labs today - BMET   Medication Instructions:    Continue with your current medicines.     Testing/Procedures To Be Arranged:  Echocardiogram  Cardiac CT at [facility]  Follow-Up:   See Dr. Roxy Manns after your studies are completed  Continue with your dental work    Other Special Instructions:   N/A    If you need a refill on your cardiac medications before your next appointment, please call your pharmacy.   Call the [place] office at [phone] if you have any questions, problems or concerns.

## 2017-09-06 NOTE — Assessment & Plan Note (Signed)
Discussed with patient again at great length. Using the tramadol. We'll likely have to do name Rollene Rotunda medicine contract if this continues. Hopefully when patient started with her new orthotics as well as when patient has her cardiac intervention she should be feeling better overall people to discontinue the medications.

## 2017-09-06 NOTE — Patient Instructions (Addendum)
Good to see you  Danielle Obrien is your friend.  We will continue with the tramadol  Continue with the iron  Will be thinking of you with the heart.  We will get you orthotics.  See me again within 6 months.

## 2017-09-06 NOTE — Progress Notes (Signed)
CARDIOLOGY OFFICE NOTE  Date:  09/06/2017    [name] Date of Birth: [birth year] Medical Record #[id number]  PCP:  Flossie Buffy, NP  Cardiologist:  Radford Pax  Chief Complaint  Patient presents with  . Aortic Stenosis    Work in visit - seen for Dr. Radford Pax    History of Present Illness: Danielle Obrien is a 36 y.o. female who presents today for a follow up visit. Seen for Dr. Radford Pax.   She has a history of HTN, bicuspid AV with a heart murmur, bipolar disorder with chronic anxiety, tobacco abuse, arthritis and asthma.    In 2015 she underwent left and right heart catheterization - by report there was "no gradient between the left ventricle and aorta" but aortic valve area was calculated 1.3 cm. The patient had no significant coronary artery disease and had normal pulmonary artery pressures.  She had GXT testing last year - noted that her BP dropped with exercise - she was referred on for evaluation for AVR. Noted by Dr. Roxy Manns at that time that she had been advised to proceed on with AVR - however the patient wanted to hold off due to her mother having surgery. Close follow up was recommended - she has not been here since.   Comes in today. Here alone. She notes that "life has happened" and that is why she has not been back. She says she is now ready to proceed. She has had some intermittent chest pain - she feels this is more anxiety related. She notes more fatigue however. She is short of breath with exertion. She is still smoking but asking about Chantix. Says she wants to stop. She has NOT had syncope and is not dizzy. She is currently seeing her dentist and having a partial bridge placed. She has been good about taking antibiotics prior to dental procedures.  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 She is taking her medicines. She denies being pregnant - says she has had prior partial hysterectomy.   Past Medical History:  Diagnosis Date  . Abnormal Pap smear of  cervix    --recurrent ascus w/Pos. HR HPV  . ADD (attention deficit disorder)   . Alcohol abuse, in remission 2012  . Allergy   . Anxiety   . Aortic stenosis   . Aortic stenosis due to bicuspid aortic valve 02/20/2014  . Arthritis    In hips  . Asthma    rare inhaler use  . Bicuspid aortic valve    moderate AS by echo 08/2015 with mean AVG 32mmHg and AVA 1.1cm2  . Bipolar disorder ([place])   . Headache   . Hyperlipidemia with target LDL less than 130 01/27/2013  . Hypertension   . Muscle spasms of both lower extremities    both hips  . Tobacco abuse 09/27/2015  . 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 ABDOMINAL HYSTERECTOMY  02/06/2009   Robotic total laparoscopic hysterectomy  . ANTERIOR CRUCIATE LIGAMENT REPAIR  1993  . CARDIAC CATHETERIZATION  June 2015   no CAD - moderate AS noted  . 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 COLPOSCOPY  10/2008   CIN I & II  . COLPOSCOPY  07/2000   Neg. ECC  . COLPOSCOPY  06/2001   CIN I  .  COLPOSCOPY  08/2004   ECC--atypia  . COLPOSCOPY N/A 01/21/2016   Procedure: COLPOSCOPY with vaginal biopsy with CO 2 Laser of Vaginal and vulvar condyloma;  Surgeon: [name], MD;  Location: [facility];  Service: Gynecology;  Laterality: N/A;  [name] will be here 2/21 for 1115 case confirmed 01/16/15 - TS  . HERNIA REPAIR  1981/1982  . HIP SURGERY  1981   Hip Reset   . HIP SURGERY  1990   Plate was reconstructed/ took out growth plate in Right knee  . HIP SURGERY  1992   Plate removed in Left hip  . LEFT AND RIGHT HEART CATHETERIZATION WITH CORONARY ANGIOGRAM N/A 05/18/2014   Procedure: LEFT AND RIGHT HEART CATHETERIZATION WITH CORONARY ANGIOGRAM;  Surgeon: Jettie Booze, MD;  Location: [facility] CATH LAB;  Service: Cardiovascular;  Laterality: N/A;  . LYMPH NODE BIOPSY  1995  . NASAL  SEPTUM SURGERY  2002  . TONSILLECTOMY AND ADENOIDECTOMY  1990  . TYMPANOSTOMY TUBE PLACEMENT  1981/1982     Medications: Current Meds  Medication Sig  . albuterol (PROVENTIL HFA;VENTOLIN HFA) 108 (90 Base) MCG/ACT inhaler Inhale 2 puffs into the lungs every 6 (six) hours as needed for wheezing or shortness of breath.  . budesonide-formoterol (SYMBICORT) 160-4.5 MCG/ACT inhaler Inhale 2 puffs into the lungs 2 (two) times daily. Rinse mouthe after each use  . busPIRone (BUSPAR) 30 MG tablet TAKE ONE TABLET BY MOUTH TWICE DAILY AT 10AM AND 5PM  . CALCIUM PO Take 2 tablets by mouth daily.  . fluticasone (FLONASE) 50 MCG/ACT nasal spray Place 2 sprays into both nostrils daily.  Marland Kitchen gabapentin (NEURONTIN) 300 MG capsule TAKE 2 CAPSULES BY MOUTH THREE TIMES A DAY  . guaiFENesin (MUCINEX) 600 MG 12 hr tablet Take 1 tablet (600 mg total) by mouth 2 (two) times daily as needed for cough or to loosen phlegm.  . lamoTRIgine (LAMICTAL) 200 MG tablet TAKE 2 TABLETS (400 MG TOTAL) BY MOUTH DAILY.  . Lansoprazole (PREVACID PO) Take 1 tablet by mouth daily as needed (indigestion).  Marland Kitchen losartan (COZAAR) 50 MG tablet Take 1 tablet (50 mg total) by mouth daily.  . Multiple Vitamin (MULTIVITAMIN WITH MINERALS) TABS tablet Take 1 tablet by mouth daily.  . traMADol (ULTRAM) 50 MG tablet TAKE 2 TABLETS BY MOUTH EVERY 6 HOURS AS NEEDED     Allergies: Allergies  Allergen Reactions  . Demerol Nausea And Vomiting  . Ibuprofen     Gastritis   . Morphine And Related Nausea And Vomiting  . Codeine Itching    Social History: The patient  reports that she has been smoking Cigarettes.  She has a 6.00 pack-year smoking history. She has never used smokeless tobacco. She reports that she does not drink alcohol or use drugs.   Family History: The patient's family history includes Hyperlipidemia in her brother, father, and mother; Hypertension in her brother, father, and mother; Migraines in her father; Thyroid disease  in her maternal grandfather, maternal grandmother, and mother.   Review of Systems: Please see the history of present illness.   Otherwise, the review of systems is positive for none.   All other systems are reviewed and negative.   Physical Exam: VS:  BP 110/80 (BP Location: Left Arm, Patient Position: Sitting, Cuff Size: Normal)   Pulse 86   Ht 5' (1.524 m)   Wt 117 lb 12.8 oz (53.4 kg)   BMI 23.01 kg/m  .  BMI Body mass index is 23.01 kg/m.  Wt Readings from Last 3 Encounters:  09/06/17 117 lb 12.8 oz (53.4 kg)  09/06/17 118 lb (53.5 kg)  07/01/17 119 lb (54 kg)    General: Alert. Looks older than her stated age. Little tearful. She is in no acute distress.   HEENT: Normal.  Neck: Supple, no JVD, carotid bruits, or masses noted.  Cardiac: Regular rate and rhythm. Harsh outflow murmur. No edema.  Respiratory:  Lungs are clear to auscultation bilaterally with normal work of breathing.  GI: Soft and nontender.  MS: No deformity or atrophy. Gait and ROM intact.  Skin: Warm and dry. Color is normal.  Neuro:  Strength and sensation are intact and no gross focal deficits noted.  Psych: Alert, appropriate and with normal affect.   LABORATORY DATA:  EKG:  EKG is ordered today. This demonstrates NSR - reviewed with Dr. Burt Knack here in the office today.  Lab Results  Component Value Date   WBC 9.6 04/27/2017   HGB 14.4 04/27/2017   HCT 43.1 04/27/2017   PLT 146 (L) 04/27/2017   GLUCOSE 116 (H) 04/27/2017   CHOL 254 (H) 08/20/2016   TRIG 67.0 08/20/2016   HDL 79.00 08/20/2016   LDLDIRECT 171.0 01/27/2013   LDLCALC 162 (H) 08/20/2016   ALT 19 01/14/2016   AST 19 01/14/2016   NA 135 04/27/2017   K 3.6 04/27/2017   CL 102 04/27/2017   CREATININE 0.90 04/27/2017   BUN 16 04/27/2017   CO2 22 04/27/2017   TSH 0.64 01/14/2016   INR 0.9 05/15/2014     BNP (last 3 results)  Recent Labs  04/27/17 0155  BNP 29.8    ProBNP (last 3 results) No results for input(s):  PROBNP in the last 8760 hours.   Other Studies Reviewed Today:  Echo Study Conclusions 08/2016  - Left ventricle: The cavity size was normal. Systolic function was   normal. Wall motion was normal; there were no regional wall   motion abnormalities. Left ventricular diastolic function   parameters were normal. - Aortic valve: Bicuspid; severely thickened, severely calcified   leaflets. There was severe stenosis. Mean gradient (S): 39 mm Hg.   Peak gradient (S): 60 mm Hg. - Aortic root: The aortic root was normal in size. - Left atrium: The atrium was normal in size. - Right ventricle: The cavity size was normal. Wall thickness was   normal. Systolic function was normal. - Right atrium: The atrium was normal in size. - Tricuspid valve: There was no regurgitation. - Pulmonary arteries: Systolic pressure could not be accurately   estimated. - Inferior vena cava: The vessel was normal in size. - Pericardium, extracardiac: There was no pericardial effusion.  Impressions:  - When compared to the prior echocardiogram from 09/24/2015 aortic   stenosis is now severe.   Notes Recorded by Sueanne Margarita, MD on 10/06/2016 at 3:09 PM EST regarding GXT Please let patient know that BP dropped significantly in recovery which can indicate hemodynamic changes due to severe AS. Please refer to Dr. Ricard Dillon for further evaluation   Cardiac Cath 2015 LEFT VENTRICULOGRAM:Left ventricular angiogram was done in the 30 RAO projection and revealed normal left ventricular wall motion and systolic function with an estimated ejection fraction of 60 %. LVEDP was 12 mmHg.  IMPRESSIONS:  1. 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 2. Normal left anterior descending artery and its branches. 3. Normal left circumflex artery and its branches. 4. Normal right coronary artery. 5. Normal  left ventricular systolic function. LVEDP 12 mmHg. Ejection fraction 60 %.  Cardiac output 5.29 L per minute, cardiac index 3.6. No pulmonary hypertension. Moderate aortic stenosis with valve area 1.3 cm.   Assessment/Plan:  1. Severe AS - endorsing chest pain intermittently and shortness of breath. No syncope. She is asking about getting her echo updated. Discussed her case with Dr. Burt Knack here in the office - will hold on repeat cardiac cath (this was done in 2015) - arrange for cardiac CT, repeat the echo and get her back to see Dr. Roxy Manns to proceed on with AVR. I have alerted Dr. Guy Sandifer office.   2. Bicuspid AV - see #1.   3. HTN - BP looks good on her current regimen. BMET today.   4 Tobacco abuse - she wants to stop. Asking about Chantix - I prefer she get this from her PCP who is prescribing her psych drugs as well. Total cessation encouraged.   Current medicines are reviewed with the patient today.  The patient does not have concerns regarding medicines other than what has been noted above.  The following changes have been made:  See above.  Labs/ tests ordered today include:    Orders Placed This Encounter  Procedures  . CT CORONARY MORPH W/CTA COR W/SCORE W/CA W/CM &/OR WO/CM  . Basic metabolic panel  . Ambulatory referral to Cardiothoracic Surgery  . EKG 12-Lead  . ECHOCARDIOGRAM COMPLETE     Disposition:   Further disposition to follow.    Patient is agreeable to this plan and will call if any problems develop in the interim.   SignedTruitt Merle, NP  09/06/2017 2:17 PM  [facility] [address] [place], [place]  [address] Phone: [phone] Fax: [phone]

## 2017-09-06 NOTE — Assessment & Plan Note (Signed)
Continues to have difficulty. Patient does have many other difficulties and problems and comorbidities. Unable to use anti-inflammatories secondary to her cardiac history. Has responded to the tramadol and low refill at the same frequency. We discussed icing regimen, patient does have breakdown of the longitudinal and transverse arch of the foot and will try to get her custom orthotics in. Patient will follow-up with me again within 3-6 months.

## 2017-09-07 LAB — BASIC METABOLIC PANEL
BUN/Creatinine Ratio: 18 (ref 9–23)
BUN: 13 mg/dL (ref 6–20)
CO2: 24 mmol/L (ref 20–29)
Calcium: 9.2 mg/dL (ref 8.7–10.2)
Chloride: 102 mmol/L (ref 96–106)
Creatinine, Ser: 0.71 mg/dL (ref 0.57–1.00)
GFR calc Af Amer: 126 mL/min/{1.73_m2} (ref >59)
GFR calc non Af Amer: 109 mL/min/{1.73_m2} (ref >59)
Glucose: 101 mg/dL — ABNORMAL HIGH (ref 65–99)
Potassium: 3.7 mmol/L (ref 3.5–5.2)
Sodium: 144 mmol/L (ref 134–144)

## 2017-09-09 ENCOUNTER — Other Ambulatory Visit: Payer: Self-pay

## 2017-09-09 ENCOUNTER — Ambulatory Visit (HOSPITAL_COMMUNITY): Payer: Managed Care, Other (non HMO) | Attending: Cardiovascular Disease

## 2017-09-09 DIAGNOSIS — E785 Hyperlipidemia, unspecified: Secondary | ICD-10-CM | POA: Insufficient documentation

## 2017-09-09 DIAGNOSIS — I10 Essential (primary) hypertension: Secondary | ICD-10-CM | POA: Insufficient documentation

## 2017-09-09 DIAGNOSIS — Z720 Tobacco use: Secondary | ICD-10-CM | POA: Insufficient documentation

## 2017-09-09 DIAGNOSIS — F1010 Alcohol abuse, uncomplicated: Secondary | ICD-10-CM | POA: Insufficient documentation

## 2017-09-09 DIAGNOSIS — J45909 Unspecified asthma, uncomplicated: Secondary | ICD-10-CM | POA: Insufficient documentation

## 2017-09-09 DIAGNOSIS — F419 Anxiety disorder, unspecified: Secondary | ICD-10-CM | POA: Diagnosis not present

## 2017-09-09 DIAGNOSIS — I350 Nonrheumatic aortic (valve) stenosis: Secondary | ICD-10-CM

## 2017-09-09 DIAGNOSIS — Q231 Congenital insufficiency of aortic valve: Secondary | ICD-10-CM | POA: Diagnosis not present

## 2017-09-11 ENCOUNTER — Other Ambulatory Visit: Payer: Self-pay | Admitting: Family Medicine

## 2017-09-14 ENCOUNTER — Encounter: Payer: Self-pay | Admitting: Nurse Practitioner

## 2017-09-14 ENCOUNTER — Telehealth: Payer: Self-pay | Admitting: Family Medicine

## 2017-09-14 NOTE — Telephone Encounter (Signed)
Kana called patient and left message to call back.

## 2017-09-14 NOTE — Telephone Encounter (Signed)
Refill done.  

## 2017-09-14 NOTE — Telephone Encounter (Signed)
Pt called asking to speak with you regarding her orthotics.

## 2017-09-16 ENCOUNTER — Encounter: Payer: Managed Care, Other (non HMO) | Admitting: Thoracic Surgery (Cardiothoracic Vascular Surgery)

## 2017-09-27 ENCOUNTER — Telehealth: Payer: Self-pay | Admitting: Cardiology

## 2017-09-27 ENCOUNTER — Ambulatory Visit (HOSPITAL_COMMUNITY)
Admission: RE | Admit: 2017-09-27 | Discharge: 2017-09-27 | Disposition: A | Discharge status: Home / Self Care | Payer: Managed Care, Other (non HMO) | Source: Ambulatory Visit | Attending: Nurse Practitioner | Admitting: Nurse Practitioner

## 2017-09-27 ENCOUNTER — Encounter (HOSPITAL_COMMUNITY): Payer: Self-pay

## 2017-09-27 DIAGNOSIS — I350 Nonrheumatic aortic (valve) stenosis: Secondary | ICD-10-CM | POA: Insufficient documentation

## 2017-09-27 MED ORDER — METOPROLOL TARTRATE 5 MG/5ML IV SOLN
INTRAVENOUS | Status: AC
  Administered 2017-09-27: 5 mg via INTRAVENOUS
  Filled 2017-09-27: qty 10

## 2017-09-27 MED ORDER — METOPROLOL TARTRATE 5 MG/5ML IV SOLN
5.0000 mg | INTRAVENOUS | Status: DC | PRN
  Administered 2017-09-27 (×2): 5 mg via INTRAVENOUS
  Filled 2017-09-27 (×2): qty 5

## 2017-09-27 MED ORDER — IOPAMIDOL (ISOVUE-370) INJECTION 76%
INTRAVENOUS | Status: AC
  Administered 2017-09-27: 80 mL
  Filled 2017-09-27: qty 100

## 2017-09-27 MED ORDER — NITROGLYCERIN 0.4 MG SL SUBL
SUBLINGUAL_TABLET | SUBLINGUAL | Status: AC
  Administered 2017-09-27: 0.8 mg via SUBLINGUAL
  Filled 2017-09-27: qty 2

## 2017-09-27 MED ORDER — NITROGLYCERIN 0.4 MG SL SUBL
0.8000 mg | SUBLINGUAL_TABLET | Freq: Once | SUBLINGUAL | Status: AC
  Administered 2017-09-27: 0.8 mg via SUBLINGUAL
  Filled 2017-09-27: qty 25

## 2017-09-27 NOTE — Progress Notes (Signed)
Patient tolerated CT without incident. Gave patient coffee and crackers tolerated well, Ambulated steady gait to exit.

## 2017-09-27 NOTE — Telephone Encounter (Signed)
Danielle Obrien is returning a Call .  Thanks

## 2017-09-27 NOTE — Telephone Encounter (Signed)
Reviewed results with pt who will keep appt as scheduled with Dr Roxy Manns.

## 2017-09-27 NOTE — Telephone Encounter (Signed)
Notes recorded by Burtis Junes, NP on 09/27/2017 at 12:21 PM EDT Ok to report. No coronary disease noted.  She needs to keep her follow up planned for later this week with Dr. Roxy Manns for discussion. I have routed this result to Dr. Roxy Manns as well.

## 2017-09-29 ENCOUNTER — Encounter: Payer: Self-pay | Admitting: Thoracic Surgery (Cardiothoracic Vascular Surgery)

## 2017-09-29 ENCOUNTER — Institutional Professional Consult (permissible substitution) (INDEPENDENT_AMBULATORY_CARE_PROVIDER_SITE_OTHER): Payer: Managed Care, Other (non HMO) | Admitting: Thoracic Surgery (Cardiothoracic Vascular Surgery)

## 2017-09-29 VITALS — BP 98/62 | HR 72 | Ht 60.75 in | Wt 112.0 lb

## 2017-09-29 DIAGNOSIS — Q230 Congenital stenosis of aortic valve: Secondary | ICD-10-CM | POA: Diagnosis not present

## 2017-09-29 DIAGNOSIS — Q231 Congenital insufficiency of aortic valve: Secondary | ICD-10-CM

## 2017-09-29 DIAGNOSIS — I350 Nonrheumatic aortic (valve) stenosis: Secondary | ICD-10-CM

## 2017-09-29 DIAGNOSIS — Q2381 Bicuspid aortic valve: Secondary | ICD-10-CM

## 2017-09-29 NOTE — Progress Notes (Signed)
[address]       [place] [address]             [phone]     CARDIOTHORACIC SURGERY CONSULTATION REPORT  Referring Provider is Burtis Junes, NP  Primary Cardiologist is Sueanne Margarita, MD PCP is Nche, Charlene Brooke, NP  Chief Complaint  Patient presents with  . Aortic Stenosis    echo 09/09/2017    HPI:  Patient is a 36 year old female with known history of bicuspid aortic valve and severe aortic stenosis, bipolar disorder with chronic anxiety, tobacco abuse, asthma, and arthritis. who returns to the office today for follow-up with hopes to proceed with elective aortic valve replacement in the near future. She was originally seen in consultation nearly 1 year ago on 10/12/2016. At that time she remained essentially asymptomatic and did not wish to proceed with elective surgical intervention. She states that over the past year she has developed significant decreased exercise tolerance with worsening fatigue and occasional episodes of substernal chest discomfort.  She has also developed some mild exertional shortness of breath with more strenuous activity. She denies any resting shortness of breath or chest discomfort. She has not had dizzy spells or near syncope. Appetite is good. She continues to smoke cigarettes.  Patient is single and lives locally in [place]. 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 She does not exercise on a regular basis. She has a long history of tobacco abuse and currently smokes one half pack of cigarettes daily. She takes several medications on a regular basis and has no significant contraindication to long-term anticoagulation.  Past Medical History:  Diagnosis Date  . Abnormal Pap smear of cervix    --recurrent ascus w/Pos. HR HPV  . ADD (attention deficit disorder)   . Alcohol abuse, in remission 2012  . Allergy   . Anxiety   . Aortic stenosis   . Aortic stenosis due to bicuspid  aortic valve 02/20/2014  . Arthritis    In hips  . Asthma    rare inhaler use  . Bicuspid aortic valve    moderate AS by echo 08/2015 with mean AVG 26mmHg and AVA 1.1cm2  . Bipolar disorder ([place])   . Headache   . Hyperlipidemia with target LDL less than 130 01/27/2013  . Hypertension   . Muscle spasms of both lower extremities    both hips  . Tobacco abuse 09/27/2015  . 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 ABDOMINAL HYSTERECTOMY  02/06/2009   Robotic total laparoscopic hysterectomy  . ANTERIOR CRUCIATE LIGAMENT REPAIR  1993  . CARDIAC CATHETERIZATION  June 2015   no CAD - moderate AS noted  . 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 COLPOSCOPY  10/2008   CIN I & II  . COLPOSCOPY  07/2000   Neg. ECC  . COLPOSCOPY  06/2001   CIN I  . COLPOSCOPY  08/2004   ECC--atypia  . COLPOSCOPY N/A 01/21/2016   Procedure: COLPOSCOPY with vaginal biopsy with CO 2 Laser of Vaginal and vulvar condyloma;  Surgeon: [name], MD;  Location: [facility];  Service: Gynecology;  Laterality: N/A;  [name] will be here 2/21 for 1115 case confirmed 01/16/15 - TS  . HERNIA REPAIR  1981/1982  . HIP SURGERY  1981   Hip Reset   . HIP SURGERY  1990   Plate was reconstructed/ took out growth plate in Right knee  . HIP SURGERY  1992   Plate removed in Left hip  . LEFT AND RIGHT HEART CATHETERIZATION WITH CORONARY ANGIOGRAM N/A 05/18/2014   Procedure: LEFT AND RIGHT HEART CATHETERIZATION WITH CORONARY ANGIOGRAM;  Surgeon: Jettie Booze, MD;  Location: [facility] CATH LAB;  Service: Cardiovascular;  Laterality: N/A;  . LYMPH NODE BIOPSY  1995  . NASAL SEPTUM SURGERY  2002  . TONSILLECTOMY AND ADENOIDECTOMY  1990  . TYMPANOSTOMY TUBE PLACEMENT  1981/1982    Family History  Problem Relation Age of Onset  . Hyperlipidemia Mother   . Hypertension  Mother   . Thyroid disease Mother   . Hyperlipidemia Father   . Hypertension Father   . Migraines Father   . Hypertension Brother   . Hyperlipidemia Brother   . Thyroid disease Maternal Grandmother   . Thyroid disease Maternal Grandfather   . Cancer Neg Hx     Social History   Social History  . Marital status: Single    Spouse name: N/A  . Number of children: N/A  . Years of education: N/A   Occupational History  . Not on file.   Social History Main Topics  . Smoking status: Current Every Day Smoker    Packs/day: 0.50    Years: 12.00    Types: Cigarettes  . Smokeless tobacco: Never Used  . Alcohol use No  . Drug use: No  . Sexual activity: Yes    Partners: Male    Birth control/ protection: Surgical     Comment: Hysterectomy   Other Topics Concern  . Not on file   Social History Narrative  . No narrative on file    Current Outpatient Prescriptions  Medication Sig Dispense Refill  . albuterol (PROVENTIL HFA;VENTOLIN HFA) 108 (90 Base) MCG/ACT inhaler Inhale 2 puffs into the lungs every 6 (six) hours as needed for wheezing or shortness of breath. 1 Inhaler 0  . busPIRone (BUSPAR) 30 MG tablet TAKE ONE TABLET BY MOUTH TWICE DAILY AT 10AM AND 5PM 60 tablet 3  . CALCIUM PO Take 2 tablets by mouth daily.    Marland Kitchen gabapentin (NEURONTIN) 300 MG capsule TAKE 2 CAPSULES BY MOUTH THREE TIMES A DAY 360 capsule 0  . lamoTRIgine (LAMICTAL) 200 MG tablet TAKE 2 TABLETS (400 MG TOTAL) BY MOUTH DAILY. 180 tablet 2  . Lansoprazole (PREVACID PO) Take 1 tablet by mouth daily as needed (indigestion).    Marland Kitchen losartan (COZAAR) 50 MG tablet Take 1 tablet (50 mg total) by mouth daily. 90 tablet 3  . Multiple Vitamin (MULTIVITAMIN WITH MINERALS) TABS tablet Take 1 tablet by mouth daily.    . traMADol (ULTRAM) 50 MG tablet TAKE 2 TABLETS BY MOUTH EVERY 12 HOURS AS NEEDED 90 tablet 0   No current facility-administered medications for this visit.     Allergies  Allergen Reactions  . Demerol  Nausea And Vomiting  . Ibuprofen     Gastritis   . Morphine And Related Nausea And Vomiting  . Codeine Itching      Review of Systems:   General:  normal appetite, decreased energy, no weight gain, no weight loss, no fever  Cardiac:  + chest pain with exertion, no chest pain at rest, +SOB with exertion, no resting SOB, no PND, no orthopnea, no palpitations, no arrhythmia, no atrial fibrillation, no  LE edema, no dizzy spells, no syncope  Respiratory:  no shortness of breath, no home oxygen, no productive cough, intermittent dry cough, no bronchitis, no wheezing, no hemoptysis, + asthma, no pain with inspiration or cough, no sleep apnea, no CPAP at night  GI:   no difficulty swallowing, no reflux, no frequent heartburn, no hiatal hernia, no abdominal pain, no constipation, no diarrhea, no hematochezia, no hematemesis, no melena  GU:   no dysuria,  no frequency, no urinary tract infection, no hematuria, no kidney stones, no kidney disease  Vascular:  no pain suggestive of claudication, no pain in feet, no leg cramps, no varicose veins, no DVT, no non-healing foot ulcer  Neuro:   no stroke, no TIA's, no seizures, no headaches, no temporary blindness one eye,  no slurred speech, no peripheral neuropathy, no chronic pain, no instability of gait, no memory/cognitive dysfunction  Musculoskeletal: mild arthritis, no joint swelling, no myalgias, no difficulty walking, normal mobility   Skin:   no rash, no itching, no skin infections, no pressure sores or ulcerations  Psych:   + anxiety, + depression, + nervousness, + unusual recent stress  Eyes:   no blurry vision, no floaters, no recent vision changes, does not wears glasses or contacts  ENT:   + hearing loss, no loose or painful teeth, no dentures, last saw dentist within the past few months  Hematologic:  no easy bruising, no abnormal bleeding, no clotting disorder, no frequent epistaxis  Endocrine:  no diabetes, does not check CBG's at  [facility]     Physical Exam:   BP 98/62   Pulse 72   Ht 5' 0.75" (1.543 m)   Wt 112 lb (50.8 kg)   SpO2 98%   BMI 21.34 kg/m   General:    well-appearing  HEENT:  Unremarkable   Neck:   no JVD, no bruits, no adenopathy   Chest:   clear to auscultation, symmetrical breath sounds, no wheezes, no rhonchi   CV:   RRR, grade IV/VI crescendo/decrescendo systolic murmur   Abdomen:  soft, non-tender, no masses   Extremities:  warm, well-perfused, pulses palpable, no LE edema  Rectal/GU  Deferred  Neuro:   Grossly non-focal and symmetrical throughout  Skin:   Clean and dry, no rashes, no breakdown   Diagnostic Tests:  Transthoracic Echocardiography  Patient:    Shavell, Nored MR #:       [id number] Study Date: 09/09/2017 Gender:     F Age:        36 Height:     152.4 cm Weight:     53.4 kg BSA:        1.51 m^2 Pt. Status: Room:   SONOGRAPHER  Cindy Hazy, RDCS  ATTENDING    Sanda Klein, MD  Windell Moment, Marlane Hatcher  REFERRING    Burtis Junes  PERFORMING   [facility]  REFERRING    Wilfred Lacy Lum  cc:  ------------------------------------------------------------------- LV EF: 55% -   60%  ------------------------------------------------------------------- Indications:      I35.0 Aortic Stenosis.  ------------------------------------------------------------------- History:   PMH:  Acquired from the patient and from the patient&'s chart.  PMH:  Asthma. Anxiety. Aortic Stenosis-Bicuspid Aortic Valve.  Risk factors:  Alcohol Abuse. Current tobacco use. Hypertension. Dyslipidemia.  ------------------------------------------------------------------- Study Conclusions  - Left ventricle: The cavity size was normal. Wall thickness was   normal. Systolic function was normal. The estimated ejection   fraction was in the range of 55%  to 60%. Wall motion was normal;   there were no regional wall motion abnormalities. - Aortic valve:  Bicuspid; severely thickened, severely calcified   leaflets. Posterior cusp mobility was severely restricted. There   was severe stenosis.  Impressions:  - Aortic valve gradients have worsened compared to last year and   the ejection jet is mid peaking.  ------------------------------------------------------------------- Study data:   Study status:  Routine.  Procedure:  The patient reported no pain pre or post test. Transthoracic echocardiography for left ventricular function evaluation, for right ventricular function evaluation, and for assessment of valvular function. Image quality was adequate.  Study completion:  There were no complications.          Transthoracic echocardiography.  M-mode, complete 2D, spectral Doppler, and color Doppler.  Birthdate: Patient birthdate: 13-Aug-1981.  Age:  Patient is 36 yr old.  Sex: Gender: female.    BMI: 23 kg/m^2.  Blood pressure:     110/80 Patient status:  Outpatient.  Study date:  Study date: 09/09/2017. Study time: 07:56 AM.  Location:  [facility]  -------------------------------------------------------------------  ------------------------------------------------------------------- Left ventricle:  The cavity size was normal. Wall thickness was normal. Systolic function was normal. The estimated ejection fraction was in the range of 55% to 60%. Wall motion was normal; there were no regional wall motion abnormalities.  ------------------------------------------------------------------- Aortic valve:   Bicuspid; severely thickened, severely calcified leaflets.  Posterior cusp mobility was severely restricted. Doppler:   There was severe stenosis.   There was no regurgitation.    VTI ratio of LVOT to aortic valve: 0.28. Valve area (VTI): 0.97 cm^2. Indexed valve area (VTI): 0.64 cm^2/m^2. Peak velocity ratio of LVOT to aortic valve: 0.26. Valve area (Vmax): 0.9 cm^2. Indexed valve area (Vmax): 0.6 cm^2/m^2. Mean velocity  ratio of LVOT to aortic valve: 0.25. Valve area (Vmean): 0.85 cm^2. Indexed valve area (Vmean): 0.56 cm^2/m^2.    Mean gradient (S): 45 mm Hg. Peak gradient (S): 77 mm Hg.  ------------------------------------------------------------------- Aorta:  Aortic root: The aortic root was normal in size.  ------------------------------------------------------------------- Mitral valve:   Structurally normal valve.   Leaflet separation was normal. Mobility was not restricted.  Doppler:  Transvalvular velocity was within the normal range. There was no evidence for stenosis. There was trivial regurgitation.    Peak gradient (D): 5 mm Hg.  ------------------------------------------------------------------- Left atrium:  The atrium was normal in size.  ------------------------------------------------------------------- Right ventricle:  The cavity size was normal. Wall thickness was normal. Systolic function was normal.  ------------------------------------------------------------------- Pulmonic valve:   Poorly visualized.  The valve appears to be grossly normal.    Doppler:  Transvalvular velocity was within the normal range. There was no evidence for stenosis. There was trivial regurgitation.  ------------------------------------------------------------------- Tricuspid valve:   Structurally normal valve.    Doppler: Transvalvular velocity was within the normal range. There was no regurgitation.  ------------------------------------------------------------------- Pulmonary artery:   The main pulmonary artery was normal-sized. Systolic pressure was within the normal range.  ------------------------------------------------------------------- Right atrium:  The atrium was normal in size.  ------------------------------------------------------------------- Pericardium:  There was no pericardial  effusion.  ------------------------------------------------------------------- Systemic veins: Inferior vena cava: The vessel was normal in size.  ------------------------------------------------------------------- Measurements   Left ventricle                           Value           Reference  LV ID, ED, PLAX chordal          (  L)     41.3   mm       43 - 52  LV ID, ES, PLAX chordal          (L)     19.9   mm       23 - 38  LV fx shortening, PLAX chordal           52     %        >=29  LV PW thickness, ED                      11.2   mm       ---------  IVS/LV PW ratio, ED                      0.98            <=1.3  Stroke volume, 2D                        90     ml       ---------  Stroke volume/bsa, 2D                    60     ml/m^2   ---------  LV e&', lateral                           12.5   cm/s     ---------  LV E/e&', lateral                         9.04            ---------  LV e&', medial                            10.2   cm/s     ---------  LV E/e&', medial                          11.08           ---------  LV e&', average                           11.35  cm/s     ---------  LV E/e&', average                         9.96            ---------    Ventricular septum                       Value           Reference  IVS thickness, ED                        11     mm       ---------    LVOT                                     Value           Reference  LVOT ID, S                               21     mm       ---------  LVOT area                                3.46   cm^2     ---------  LVOT ID                                  21     mm       ---------  LVOT peak velocity, S                    113.52 cm/s     ---------  LVOT mean velocity, S                    78.5   cm/s     ---------  LVOT VTI, S                              28.45  cm       ---------  LVOT peak gradient, S                    5      mm Hg    ---------  Stroke volume (SV), LVOT DP              98.5   ml        ---------  Stroke index (SV/bsa), LVOT DP           65.2   ml/m^2   ---------    Aortic valve                             Value           Reference  Aortic valve peak velocity, S            440    cm/s     ---------  Aortic valve mean velocity, S            319    cm/s     ---------  Aortic valve VTI, S                      100    cm       ---------  Aortic mean gradient, S                  45     mm Hg    ---------  Aortic peak gradient, S                  77     mm Hg    ---------  VTI ratio, LVOT/AV                       0.28            ---------  Aortic valve area, VTI  0.97   cm^2     ---------  Aortic valve area/bsa, VTI               0.64   cm^2/m^2 ---------  Velocity ratio, peak, LVOT/AV            0.26            ---------  Aortic valve area, peak velocity         0.9    cm^2     ---------  Aortic valve area/bsa, peak              0.6    cm^2/m^2 ---------  velocity  Velocity ratio, mean, LVOT/AV            0.25            ---------  Aortic valve area, mean velocity         0.85   cm^2     ---------  Aortic valve area/bsa, mean              0.56   cm^2/m^2 ---------  velocity    Aorta                                    Value           Reference  Aortic root ID, ED                       28     mm       ---------  Ascending aorta ID, A-P, S               34     mm       ---------    Left atrium                              Value           Reference  LA ID, A-P, ES                           33     mm       ---------  LA ID/bsa, A-P                           2.18   cm/m^2   <=2.2  LA volume, S                             35     ml       ---------  LA volume/bsa, S                         23.1   ml/m^2   ---------  LA volume, ES, 1-p A4C                   39     ml       ---------  LA volume/bsa, ES, 1-p A4C               25.8   ml/m^2   ---------  LA volume, ES, 1-p A2C  28     ml       ---------  LA volume/bsa, ES, 1-p A2C                18.5   ml/m^2   ---------    Mitral valve                             Value           Reference  Mitral E-wave peak velocity              113    cm/s     ---------  Mitral A-wave peak velocity              58.7   cm/s     ---------  Mitral deceleration time                 218    ms       150 - 230  Mitral peak gradient, D                  5      mm Hg    ---------  Mitral E/A ratio, peak                   1.9             ---------    Pulmonary arteries                       Value           Reference  PA pressure, S, DP                       19     mm Hg    <=30    Tricuspid valve                          Value           Reference  Tricuspid regurg peak velocity           197    cm/s     ---------  Tricuspid peak RV-RA gradient            16     mm Hg    ---------    Systemic veins                           Value           Reference  Estimated CVP                            3      mm Hg    ---------    Right ventricle                          Value           Reference  RV pressure, S, DP                       19     mm Hg    <=30  RV s&', lateral, S  13.6   cm/s     ---------  Legend: (L)  and  (H)  mark values outside specified reference range.  ------------------------------------------------------------------- Prepared and Electronically Authenticated by  Sanda Klein, MD 2018-10-11T15:19:56   Cardiac CTA  MEDICATIONS: Sub lingual nitro. 4mg  and lopressor 10mg   TECHNIQUE: The patient was scanned on a Siemens 295 slice scanner. Gantry rotation speed was 250 msecs. Collimation was . 6 mm . A 100 kV retrospective scan was triggered in the descending thoracic aorta at 140 HU's average HR during the scan was bpm. The 3D data set was interpreted on a dedicated work station using MPR, MIP and VRT modes. A total of 80cc of contrast was used.  FINDINGS: Non-cardiac: See separate report from [facility]. No significant findings on  limited lung and soft tissue windows.  Coronary Arteries: Right dominant with no anomalies  LM:  Side by side LAD/Circumflex ostia no LM segment  LAD:  Normal  D1:  Normal  D2:  Normal  Circumflex:  Dominant normal  OM1:  Normal  OM2:  Normal  OM3:  Normal  RCA:  Normal  PDA:  Left sided normal  PLA:  Left sided normal  Aorta:  Bovine arch no PDA no aneurysm  Root:  3.1 cm  STJ:  2.7 cm  Arch:  1.8 cm  Descending Thoracic:  1.8 cm  Bicuspid Aortic Valve Fused right and left cusps. Heavy calcification of non coronary cusp. Heavy sub annular calcification  Min/Max:  23 mm x 15.7 mm  Annular Area:  348 mm2  IMPRESSION: 1) Normal left dominant coronary arteries  2) Normal aortic root 3.1 cm with bovine arch  3) Severely calcified bicuspid aortic valve Severe AS by echo gradients with mean over 44 mmHg Annulus measures 348 mm2  Jenkins Rouge   Electronically Signed   By: Jenkins Rouge M.D.   On: 09/27/2017 12:03   Impression:  Patient has a congenitally bicuspid aortic valve with stage D severe symptomatic aortic stenosis. She has recently developed progressive symptoms of exertional fatigue, shortness of breath, and chest discomfort. I have personally reviewed the patient's recent transthoracic echocardiogram and cardiac gated CT angiogram of the heart. The patient has a Seevers type 0 bicuspid aortic valve with severe stenosis. There is severe calcification on one side with extremely limited leaflet mobility.  Peak velocity across the aortic valve measured 4.4 m/s corresponding to mean transvalvular gradient estimated 45 mmHg. There is normal left ventricular systolic function.  There do not appear to be any other significant complicating features and the aortic annulus appears large enough to allow placement of at least a 21 mm prosthetic valve. 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 Coronary CT reveals widely patent coronary arteries with no significant coronary artery disease.  Previous diagnostic cardiac catheterization also revealed no signs of any significant coronary artery disease or an anomalous coronary circulation. I agree the patient needs elective aortic valve replacement. She appears to be a reasonably good candidate for minimally invasive approach for surgery.   Plan:  The patient and her parents were counseled at length regarding treatment alternatives for management of severe aortic stenosis including continued medical therapy versus proceeding with aortic valve replacement in the near future.  The natural history of aortic stenosis was reviewed, as was long term prognosis with medical therapy alone.  Surgical options were discussed at length including conventional surgical  aortic valve replacement through either a full median sternotomy or using minimally invasive techniques.   Discussion was held comparing the relative risks of mechanical valve replacement with need for lifelong anticoagulation versus use of a bioprosthetic tissue valve and the associated potential for late structural valve deterioration and failure.  This discussion was placed in the context of the patient's particular circumstances, and as a result the patient specifically requests that their valve be replaced using a mechanical prosthetic valve.  Expectations for her postoperative convalescence at been discussed. We tentatively plan to proceed with surgery on 11/03/2017. The patient will return to our office for follow-up prior to surgery on 11/01/2017. She has been advised to stop smoking immediately.   I spent in excess of 60 minutes during the conduct of this office consultation and >50% of this time involved direct face-to-face encounter with the patient for counseling and/or coordination of their care.    Valentina Gu.  Roxy Manns, MD 09/29/2017 4:33 PM

## 2017-09-29 NOTE — Patient Instructions (Addendum)
Stop smoking immediately and permanently.  Continue all previous medications without any changes at this time  

## 2017-09-30 ENCOUNTER — Other Ambulatory Visit: Payer: Self-pay

## 2017-09-30 DIAGNOSIS — I350 Nonrheumatic aortic (valve) stenosis: Secondary | ICD-10-CM

## 2017-10-26 ENCOUNTER — Other Ambulatory Visit: Payer: Self-pay | Admitting: Family Medicine

## 2017-10-28 NOTE — Progress Notes (Signed)
Procedure Note   Patient was fitted for a : standard, cushioned, semi-rigid orthotic. The orthotic was heated and afterward the patient patient seated position and molded The patient was positioned in subtalar neutral position and 10 degrees of ankle dorsiflexion in a weight bearing stance. After completion of molding, patient did have orthotic management The blank was ground to a stable position for weight bearing. Size: Base: Carbon fiber Additional Posting and Padding:  The patient ambulated these, and they were very comfortable.

## 2017-10-29 ENCOUNTER — Encounter: Payer: Self-pay | Admitting: Family Medicine

## 2017-10-29 ENCOUNTER — Other Ambulatory Visit: Payer: Self-pay | Admitting: Family Medicine

## 2017-10-29 ENCOUNTER — Ambulatory Visit (INDEPENDENT_AMBULATORY_CARE_PROVIDER_SITE_OTHER): Payer: Managed Care, Other (non HMO) | Admitting: Family Medicine

## 2017-10-29 DIAGNOSIS — M216X9 Other acquired deformities of unspecified foot: Secondary | ICD-10-CM | POA: Diagnosis not present

## 2017-10-29 NOTE — Assessment & Plan Note (Signed)
Patient did have custom orthotics made again today.  Patient over the course of several years.  Patient though has responded well to these previously.  Patient will increase work slowly over the course the next several days.  We discussed about icing regimen still in the home exercises.  Has tramadol for breakthrough pain for some of her other problems.  Follow-up with me again 4-6 weeks

## 2017-11-01 ENCOUNTER — Encounter (HOSPITAL_COMMUNITY)
Admission: RE | Admit: 2017-11-01 | Discharge: 2017-11-01 | Disposition: A | Discharge status: Home / Self Care | Payer: Managed Care, Other (non HMO) | Source: Ambulatory Visit | Attending: Thoracic Surgery (Cardiothoracic Vascular Surgery) | Admitting: Thoracic Surgery (Cardiothoracic Vascular Surgery)

## 2017-11-01 ENCOUNTER — Ambulatory Visit: Payer: Managed Care, Other (non HMO) | Admitting: Thoracic Surgery (Cardiothoracic Vascular Surgery)

## 2017-11-01 ENCOUNTER — Ambulatory Visit (HOSPITAL_BASED_OUTPATIENT_CLINIC_OR_DEPARTMENT_OTHER)
Admission: RE | Admit: 2017-11-01 | Discharge: 2017-11-01 | Disposition: A | Discharge status: Home / Self Care | Payer: Managed Care, Other (non HMO) | Source: Ambulatory Visit | Attending: Thoracic Surgery (Cardiothoracic Vascular Surgery) | Admitting: Thoracic Surgery (Cardiothoracic Vascular Surgery)

## 2017-11-01 ENCOUNTER — Other Ambulatory Visit: Payer: Self-pay

## 2017-11-01 ENCOUNTER — Ambulatory Visit (HOSPITAL_COMMUNITY)
Admission: RE | Admit: 2017-11-01 | Discharge: 2017-11-01 | Disposition: A | Discharge status: Home / Self Care | Payer: Managed Care, Other (non HMO) | Source: Ambulatory Visit | Attending: Thoracic Surgery (Cardiothoracic Vascular Surgery) | Admitting: Thoracic Surgery (Cardiothoracic Vascular Surgery)

## 2017-11-01 ENCOUNTER — Encounter (HOSPITAL_COMMUNITY): Payer: Self-pay

## 2017-11-01 ENCOUNTER — Encounter: Payer: Self-pay | Admitting: Thoracic Surgery (Cardiothoracic Vascular Surgery)

## 2017-11-01 VITALS — BP 109/77 | HR 77 | Ht 60.0 in | Wt 116.0 lb

## 2017-11-01 DIAGNOSIS — Q230 Congenital stenosis of aortic valve: Secondary | ICD-10-CM

## 2017-11-01 DIAGNOSIS — I350 Nonrheumatic aortic (valve) stenosis: Secondary | ICD-10-CM

## 2017-11-01 DIAGNOSIS — Z01812 Encounter for preprocedural laboratory examination: Secondary | ICD-10-CM

## 2017-11-01 DIAGNOSIS — I6523 Occlusion and stenosis of bilateral carotid arteries: Secondary | ICD-10-CM

## 2017-11-01 DIAGNOSIS — Q231 Congenital insufficiency of aortic valve: Secondary | ICD-10-CM | POA: Diagnosis not present

## 2017-11-01 DIAGNOSIS — Z01810 Encounter for preprocedural cardiovascular examination: Secondary | ICD-10-CM | POA: Insufficient documentation

## 2017-11-01 LAB — CBC
HCT: 42.7 % (ref 36.0–46.0)
Hemoglobin: 14.4 g/dL (ref 12.0–15.0)
MCH: 30.9 pg (ref 26.0–34.0)
MCHC: 33.7 g/dL (ref 30.0–36.0)
MCV: 91.6 fL (ref 78.0–100.0)
PLATELETS: 120 10*3/uL — AB (ref 150–400)
RBC: 4.66 MIL/uL (ref 3.87–5.11)
RDW: 13.8 % (ref 11.5–15.5)
WBC: 5.5 10*3/uL (ref 4.0–10.5)

## 2017-11-01 LAB — BLOOD GAS, ARTERIAL
ACID-BASE EXCESS: 0 mmol/L (ref 0.0–2.0)
BICARBONATE: 23.6 mmol/L (ref 20.0–28.0)
FIO2: 21
O2 SAT: 96.5 %
PATIENT TEMPERATURE: 98.6
PO2 ART: 83.2 mmHg (ref 83.0–108.0)
pCO2 arterial: 34.5 mmHg (ref 32.0–48.0)
pH, Arterial: 7.449 (ref 7.350–7.450)

## 2017-11-01 LAB — TYPE AND SCREEN
ABO/RH(D): O POS
ANTIBODY SCREEN: NEGATIVE

## 2017-11-01 LAB — COMPREHENSIVE METABOLIC PANEL
ALK PHOS: 95 U/L (ref 38–126)
ALT: 25 U/L (ref 14–54)
ANION GAP: 11 (ref 5–15)
AST: 24 U/L (ref 15–41)
Albumin: 4.3 g/dL (ref 3.5–5.0)
BUN: 12 mg/dL (ref 6–20)
CALCIUM: 9.6 mg/dL (ref 8.9–10.3)
CO2: 19 mmol/L — AB (ref 22–32)
CREATININE: 0.64 mg/dL (ref 0.44–1.00)
Chloride: 106 mmol/L (ref 101–111)
GFR calc non Af Amer: >60 mL/min (ref >60)
Glucose, Bld: 94 mg/dL (ref 65–99)
Potassium: 4.1 mmol/L (ref 3.5–5.1)
SODIUM: 136 mmol/L (ref 135–145)
Total Bilirubin: 0.5 mg/dL (ref 0.3–1.2)
Total Protein: 6.7 g/dL (ref 6.5–8.1)

## 2017-11-01 LAB — PULMONARY FUNCTION TEST
DL/VA % PRED: 94 %
DL/VA: 4.01 ml/min/mmHg/L
DLCO unc % pred: 94 %
DLCO unc: 17.75 ml/min/mmHg
FEF 25-75 Post: 2.64 L/sec
FEF 25-75 Pre: 3.02 L/sec
FEF2575-%Change-Post: -12 %
FEF2575-%PRED-POST: 88 %
FEF2575-%Pred-Pre: 100 %
FEV1-%CHANGE-POST: -4 %
FEV1-%PRED-PRE: 104 %
FEV1-%Pred-Post: 99 %
FEV1-PRE: 2.83 L
FEV1-Post: 2.69 L
FEV1FVC-%CHANGE-POST: 7 %
FEV1FVC-%Pred-Pre: 99 %
FEV6-%Change-Post: -11 %
FEV6-%PRED-PRE: 107 %
FEV6-%Pred-Post: 94 %
FEV6-Post: 3.04 L
FEV6-Pre: 3.44 L
FEV6FVC-%Pred-Post: 101 %
FEV6FVC-%Pred-Pre: 101 %
FVC-%Change-Post: -11 %
FVC-%PRED-POST: 93 %
FVC-%PRED-PRE: 105 %
FVC-POST: 3.04 L
FVC-PRE: 3.44 L
POST FEV6/FVC RATIO: 100 %
PRE FEV1/FVC RATIO: 82 %
PRE FEV6/FVC RATIO: 100 %
Post FEV1/FVC ratio: 88 %
RV % PRED: 105 %
RV: 1.41 L
TLC % pred: 107 %
TLC: 4.77 L

## 2017-11-01 LAB — URINALYSIS, ROUTINE W REFLEX MICROSCOPIC
BILIRUBIN URINE: NEGATIVE
Glucose, UA: NEGATIVE mg/dL
HGB URINE DIPSTICK: NEGATIVE
KETONES UR: NEGATIVE mg/dL
Leukocytes, UA: NEGATIVE
NITRITE: NEGATIVE
PROTEIN: NEGATIVE mg/dL
SPECIFIC GRAVITY, URINE: 1.013 (ref 1.005–1.030)
pH: 7 (ref 5.0–8.0)

## 2017-11-01 LAB — PROTIME-INR
INR: 0.87
Prothrombin Time: 11.8 seconds (ref 11.4–15.2)

## 2017-11-01 LAB — APTT: aPTT: 26 seconds (ref 24–36)

## 2017-11-01 LAB — HEMOGLOBIN A1C
Hgb A1c MFr Bld: 5.4 % (ref 4.8–5.6)
MEAN PLASMA GLUCOSE: 108.28 mg/dL

## 2017-11-01 LAB — SURGICAL PCR SCREEN
MRSA, PCR: NEGATIVE
STAPHYLOCOCCUS AUREUS: POSITIVE — AB

## 2017-11-01 LAB — ABO/RH: ABO/RH(D): O POS

## 2017-11-01 MED ORDER — ALBUTEROL SULFATE (2.5 MG/3ML) 0.083% IN NEBU
2.5000 mg | INHALATION_SOLUTION | Freq: Once | RESPIRATORY_TRACT | Status: AC
  Administered 2017-11-01: 2.5 mg via RESPIRATORY_TRACT

## 2017-11-01 NOTE — Pre-Procedure Instructions (Signed)
    Danielle Obrien  11/01/2017      [place], [place] [facility] [address] [place] [address] Phone: [phone] Fax: [phone]    Your procedure is scheduled on 11/03/17.  Report to [facility] Admitting at 630 A.M.  Call this number if you have problems the morning of surgery:  [phone]   Remember:  Do not eat food or drink liquids after midnight.  Take these medicines the morning of surgery with A SIP OF WATER --all inhalers,neutontin,ultram  Do not wear jewelry, make-up or nail polish.  Do not wear lotions, powders, or perfumes, or deoderant.  Do not shave 48 hours prior to surgery.  Men may shave face and neck.  Do not bring valuables to the hospital.  [facility] is not responsible for any belongings or valuables.  Contacts, dentures or bridgework may not be worn into surgery.  Leave your suitcase in the car.  After surgery it may be brought to your room.  For patients admitted to the hospital, discharge time will be determined by your treatment team.  Patients discharged the day of surgery will not be allowed to drive home.   Name and phone number of your driver:  Do not take any aspirin,vitamins,or herbal supplements 5-7 days prior to surgery. Special instructions:  Please read over the following fact sheets that you were given. MRSA Information

## 2017-11-01 NOTE — Patient Instructions (Signed)
   Continue taking all current medications without change through the day before surgery.  Have nothing to eat or drink after midnight the night before surgery.  On the morning of surgery take only Prevacid with a sip of water.    

## 2017-11-01 NOTE — Progress Notes (Signed)
Pre-op Cardiac Surgery  Carotid Findings: 40-59% right ICA stenosis, elevated common carotid artery velocities with no evidence of plaque to support HDSS. 60-79% proximal left ICA stenosis, elevated common carotid artery velocities with no evidence of plaque to support HDSS.  Both vertebral arteries were patent with antegrade flow.    Upper Extremity Right Left  Brachial Pressures 113, Tri 114, Tri  Radial Waveforms Bi Bi  Ulnar Waveforms Bi Bi  Palmar Arch (Allen's Test) Waveform is unchanged with radial artery compression, waveform obliterates with ulnar compression. Waveform is unchanged with radial artery compression, waveform obliterates with ulnar compression.   Lita Mains- RDMS, RVT 12:38 PM  11/01/2017

## 2017-11-01 NOTE — Progress Notes (Signed)
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  She was originally seen in consultation on September 29, 2017.  Since then she has remained clinically stable.  She states that she cut back considerably on smoking but she has not been able to quit completely.  She otherwise feels well and reports no new problems.  She is looking forward to putting the surgery behind her.   Current Outpatient Medications  Medication Sig Dispense Refill  . albuterol (PROVENTIL HFA;VENTOLIN HFA) 108 (90 Base) MCG/ACT inhaler Inhale 2 puffs into the lungs every 6 (six) hours as needed for wheezing or shortness of breath. 1 Inhaler 0  . Ascorbic Acid (VITAMIN C PO) Take 1 tablet by mouth daily.    . busPIRone (BUSPAR) 30 MG tablet TAKE ONE TABLET BY MOUTH TWICE DAILY AT 10AM AND 5PM (Patient taking differently: Take 30 mg by mouth See admin instructions. Take 30 mg by mouth twice daily at 1000 and 1700) 60 tablet 3  . Calcium Carbonate-Vitamin D (CALCIUM 600/VITAMIN D PO) Take 1 tablet by mouth daily.    Marland Kitchen gabapentin (NEURONTIN) 300 MG capsule TAKE 2 CAPSULES BY MOUTH THREE TIMES A DAY (Patient taking differently: Take 600 mg by mouth 3 times daily) 360 capsule 0  . lamoTRIgine (LAMICTAL) 200 MG tablet TAKE 2 TABLETS (400 MG TOTAL) BY MOUTH DAILY. (Patient taking differently: Take 400 mg by mouth daily. ) 180 tablet 2  . lansoprazole (PREVACID) 15 MG capsule Take 15 mg by mouth daily as needed (for heartburn or acid reflux).     Marland Kitchen losartan (COZAAR) 50 MG tablet Take 1 tablet (50 mg total) by mouth daily.  90 tablet 3  . Tetrahydrozoline HCl (VISINE OP) Place 1 drop into both eyes at bedtime as needed (for allergies).    . traMADol (ULTRAM) 50 MG tablet TAKE TWO TABLETS BY MOUTH EVERY 12 HOURS AS NEEDED 90 tablet 0   No current facility-administered medications for this visit.       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 She has recently developed progressive symptoms of exertional fatigue, shortness of breath, and chest discomfort. I have personally reviewed the patient's recent transthoracic echocardiogram and cardiac gated CT angiogram of the heart. The patient has a Sievers type 0 bicuspid aortic valve with severe stenosis. There is severe calcification on one side with extremely limited leaflet mobility.  Peak velocity across the aortic valve measured 4.4 m/s corresponding  to mean transvalvular gradient estimated 45 mmHg. There is normal left ventricular systolic function.  There do not appear to be any other significant complicating features and the aortic annulus appears large enough to allow placement of at least a 21 mm prosthetic valve. 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 Coronary CT reveals widely patent coronary arteries with no significant coronary artery disease.  Previous diagnostic cardiac catheterization also revealed no signs of any significant coronary artery disease or an  anomalous coronary circulation. I agree the patient needs elective aortic valve replacement. She appears to be a reasonably good candidate for minimally invasive approach for surgery.  Plan:  The patient and her mother were again counseled regarding the indications, risks, potential benefits of aortic valve replacement, this time with use of an interpreter to facilitate communication with the patient's mother.  Surgical options were discussed at length including conventional surgical aortic valve replacement through either median sternotomy or using minimally invasive techniques.   Discussion was held comparing the relative risks of mechanical valve replacement with need for lifelong anticoagulation versus use of a bioprosthetic tissue valve and the associated potential for late structural valve deterioration and failure.  This discussion was placed in the context of the patient's particular circumstances, and as a result the patient specifically requests that their valve be replaced using a mechanical prosthetic valve.  Expectations for her postoperative convalescence at been discussed.  They understand and accept all potential risks of surgery including but not limited to risk of death, stroke or other neurologic complication, myocardial infarction, congestive heart failure, respiratory failure, renal failure, bleeding requiring transfusion and/or reexploration, arrhythmia, infection or other wound complications, pneumonia, pleural and/or pericardial effusion, pulmonary embolus, aortic dissection or other major vascular complication, or delayed complications related to valve repair or replacement including but not limited to structural valve deterioration and failure, thrombosis, embolization, endocarditis, or paravalvular leak.  All of their questions have been answered.   I spent in excess of 15 minutes during the conduct of this office consultation and >50% of this time involved direct face-to-face  encounter with the patient for counseling and/or coordination of their care.    Valentina Gu. Roxy Manns, MD 11/01/2017 3:38 PM

## 2017-11-02 ENCOUNTER — Encounter (HOSPITAL_COMMUNITY): Payer: Self-pay | Admitting: Certified Registered Nurse Anesthetist

## 2017-11-02 MED ORDER — SODIUM CHLORIDE 0.9 % IV SOLN
INTRAVENOUS | Status: AC
  Administered 2017-11-03: .9 [IU]/h via INTRAVENOUS
  Filled 2017-11-02: qty 1

## 2017-11-02 MED ORDER — KENNESTONE BLOOD CARDIOPLEGIA VIAL
13.0000 mL | Freq: Once | Status: DC
  Filled 2017-11-02 (×2): qty 13

## 2017-11-02 MED ORDER — SODIUM CHLORIDE 0.9 % IV SOLN
INTRAVENOUS | Status: DC
  Filled 2017-11-02: qty 30

## 2017-11-02 MED ORDER — EPINEPHRINE PF 1 MG/ML IJ SOLN
0.0000 ug/min | INTRAVENOUS | Status: DC
  Filled 2017-11-02: qty 4

## 2017-11-02 MED ORDER — TRANEXAMIC ACID (OHS) BOLUS VIA INFUSION
15.0000 mg/kg | INTRAVENOUS | Status: AC
  Administered 2017-11-03: 789 mg via INTRAVENOUS
  Filled 2017-11-02: qty 789

## 2017-11-02 MED ORDER — TRANEXAMIC ACID 1000 MG/10ML IV SOLN
1.5000 mg/kg/h | INTRAVENOUS | Status: AC
  Administered 2017-11-03: 1.5 mg/kg/h via INTRAVENOUS
  Filled 2017-11-02: qty 25

## 2017-11-02 MED ORDER — TRANEXAMIC ACID (OHS) PUMP PRIME SOLUTION
2.0000 mg/kg | INTRAVENOUS | Status: DC
  Filled 2017-11-02: qty 1.05

## 2017-11-02 MED ORDER — DOPAMINE-DEXTROSE 3.2-5 MG/ML-% IV SOLN
0.0000 ug/kg/min | INTRAVENOUS | Status: DC
  Filled 2017-11-02: qty 250

## 2017-11-02 MED ORDER — VANCOMYCIN HCL 10 G IV SOLR
1250.0000 mg | INTRAVENOUS | Status: AC
  Administered 2017-11-03: 1250 mg via INTRAVENOUS
  Filled 2017-11-02: qty 1250

## 2017-11-02 MED ORDER — POTASSIUM CHLORIDE 2 MEQ/ML IV SOLN
80.0000 meq | INTRAVENOUS | Status: DC
  Filled 2017-11-02: qty 40

## 2017-11-02 MED ORDER — PLASMA-LYTE 148 IV SOLN
INTRAVENOUS | Status: DC
  Filled 2017-11-02: qty 2.5

## 2017-11-02 MED ORDER — MAGNESIUM SULFATE 50 % IJ SOLN
40.0000 meq | INTRAMUSCULAR | Status: DC
  Filled 2017-11-02: qty 9.85

## 2017-11-02 MED ORDER — DEXTROSE 5 % IV SOLN
750.0000 mg | INTRAVENOUS | Status: DC
  Filled 2017-11-02: qty 750

## 2017-11-02 MED ORDER — DEXMEDETOMIDINE HCL IN NACL 400 MCG/100ML IV SOLN
0.1000 ug/kg/h | INTRAVENOUS | Status: AC
  Administered 2017-11-03: .3 ug/kg/h via INTRAVENOUS
  Filled 2017-11-02: qty 100

## 2017-11-02 MED ORDER — KENNESTONE BLOOD CARDIOPLEGIA (KBC) MANNITOL SYRINGE (20%, 32ML)
32.0000 mL | Freq: Once | INTRAVENOUS | Status: DC
  Filled 2017-11-02 (×2): qty 32

## 2017-11-02 MED ORDER — DEXTROSE 5 % IV SOLN
1.5000 g | INTRAVENOUS | Status: AC
  Administered 2017-11-03: 1.5 g via INTRAVENOUS
  Administered 2017-11-03: .75 g via INTRAVENOUS
  Filled 2017-11-02: qty 1.5

## 2017-11-02 MED ORDER — SODIUM CHLORIDE 0.9 % IV SOLN
30.0000 ug/min | INTRAVENOUS | Status: AC
  Administered 2017-11-03: 25 ug/min via INTRAVENOUS
  Filled 2017-11-02: qty 2

## 2017-11-02 MED ORDER — VANCOMYCIN HCL 1000 MG IV SOLR
INTRAVENOUS | Status: AC
  Administered 2017-11-03: 1000 mL
  Filled 2017-11-02: qty 1000

## 2017-11-02 MED ORDER — NITROGLYCERIN IN D5W 200-5 MCG/ML-% IV SOLN
2.0000 ug/min | INTRAVENOUS | Status: AC
  Administered 2017-11-03: 10 ug/min via INTRAVENOUS
  Filled 2017-11-02: qty 250

## 2017-11-02 MED ORDER — MILRINONE LACTATE IN DEXTROSE 20-5 MG/100ML-% IV SOLN
0.1250 ug/kg/min | INTRAVENOUS | Status: DC
  Filled 2017-11-02: qty 100

## 2017-11-02 NOTE — H&P (Signed)
[address]       [place] [address]             [phone]          CARDIOTHORACIC SURGERY HISTORY AND PHYSICAL EXAM  Referring Provider is Burtis Junes, NP  Primary Cardiologist is Sueanne Margarita, MD PCP is Nche, Charlene Brooke, NP      Chief Complaint  Patient presents with  . Aortic Stenosis    echo 09/09/2017    HPI:  Patient is a 36 year old female with known history of bicuspid aortic valve and severe aortic stenosis, bipolar disorder with chronic anxiety, tobacco abuse, asthma, and arthritis. who returns to the office today for follow-up with hopes to proceed with elective aortic valve replacement in the near future. She was originally seen in consultation nearly 1 year ago on 10/12/2016. At that time she remained essentially asymptomatic and did not wish to proceed with elective surgical intervention. She states that over the past year she has developed significant decreased exercise tolerance with worsening fatigue and occasional episodes of substernal chest discomfort.  She has also developed some mild exertional shortness of breath with more strenuous activity. She denies any resting shortness of breath or chest discomfort. She has not had dizzy spells or near syncope. Appetite is good. She continues to smoke cigarettes.  Patient is single and lives locally in [place]. 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 She does not exercise on a regular basis. She has a long history of tobacco abuse and currently smokes one half pack of cigarettes daily. She takes several medications on a regular basis and has no significant contraindication to long-term anticoagulation.  Patient returns the office today for follow-up of bicuspid aortic valve with severe symptomatic aortic stenosis with tentative plans to proceed with minimally invasive aortic valve replacement later this week.  She was originally seen in consultation  on September 29, 2017.  Since then she has remained clinically stable.  She states that she cut back considerably on smoking but she has not been able to quit completely.  She otherwise feels well and reports no new problems.  She is looking forward to putting the surgery behind her.   Past Medical History:  Diagnosis Date  . Abnormal Pap smear of cervix    --recurrent ascus w/Pos. HR HPV  . ADD (attention deficit disorder)   . Alcohol abuse, in remission 2012  . Allergy   . Anxiety   . Aortic stenosis   . Aortic stenosis due to bicuspid aortic valve 02/20/2014  . Arthritis    In hips  . Asthma    rare inhaler use  . Bicuspid aortic valve    moderate AS by echo 08/2015 with mean AVG 74mmHg and AVA 1.1cm2  . Bipolar disorder ([place])   . Headache   . Hyperlipidemia with target LDL less than 130 01/27/2013  . Hypertension   . Muscle spasms of both lower extremities    both hips  . Tobacco abuse 09/27/2015  . 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 ABDOMINAL HYSTERECTOMY  02/06/2009   Robotic total laparoscopic hysterectomy  . ANTERIOR CRUCIATE LIGAMENT REPAIR  1993  . CARDIAC CATHETERIZATION  June 2015   no CAD - moderate AS noted  . 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 COLPOSCOPY  10/2008   CIN I & II  . COLPOSCOPY  07/2000   Neg. ECC  . COLPOSCOPY  06/2001   CIN I  . COLPOSCOPY  08/2004   ECC--atypia  . COLPOSCOPY N/A 01/21/2016   Procedure: COLPOSCOPY with vaginal biopsy with CO 2 Laser of Vaginal and vulvar condyloma;  Surgeon: [name], MD;  Location: [facility];  Service: Gynecology;  Laterality: N/A;  [name] will be here 2/21 for 1115 case confirmed 01/16/15 - TS  . HERNIA REPAIR  1981/1982  . HIP SURGERY  1981   Hip Reset   . HIP SURGERY  1990   Plate was reconstructed/ took out growth plate in Right  knee  . HIP SURGERY  1992   Plate removed in Left hip  . LEFT AND RIGHT HEART CATHETERIZATION WITH CORONARY ANGIOGRAM N/A 05/18/2014   Procedure: LEFT AND RIGHT HEART CATHETERIZATION WITH CORONARY ANGIOGRAM;  Surgeon: Jettie Booze, MD;  Location: [facility] CATH LAB;  Service: Cardiovascular;  Laterality: N/A;  . LYMPH NODE BIOPSY  1995  . NASAL SEPTUM SURGERY  2002  . TONSILLECTOMY AND ADENOIDECTOMY  1990  . TYMPANOSTOMY TUBE PLACEMENT  1981/1982    Family History  Problem Relation Age of Onset  . Hyperlipidemia Mother   . Hypertension Mother   . Thyroid disease Mother   . Hyperlipidemia Father   . Hypertension Father   . Migraines Father   . Hypertension Brother   . Hyperlipidemia Brother   . Thyroid disease Maternal Grandmother   . Thyroid disease Maternal Grandfather   . Cancer Neg Hx     Social History Social History   Tobacco Use  . Smoking status: Current Every Day Smoker    Packs/day: 0.50    Years: 12.00    Pack years: 6.00    Types: Cigarettes  . Smokeless tobacco: Never Used  Substance Use Topics  . Alcohol use: No    Alcohol/week: 0.0 oz  . Drug use: No    Prior to Admission medications   Medication Sig Start Date End Date Taking? Authorizing Provider  albuterol (PROVENTIL HFA;VENTOLIN HFA) 108 (90 Base) MCG/ACT inhaler Inhale 2 puffs into the lungs every 6 (six) hours as needed for wheezing or shortness of breath. 12/21/16  Yes Nche, Charlene Brooke, NP  Ascorbic Acid (VITAMIN C PO) Take 1 tablet by mouth daily.   Yes [provider]  busPIRone (BUSPAR) 30 MG tablet TAKE ONE TABLET BY MOUTH TWICE DAILY AT 10AM AND 5PM Patient taking differently: Take 30 mg by mouth See admin instructions. Take 30 mg by mouth twice daily at 1000 and 1700 07/01/17  Yes Nche, Charlene Brooke, NP  Calcium Carbonate-Vitamin D (CALCIUM 600/VITAMIN D PO) Take 1 tablet by mouth daily.   Yes [provider]  gabapentin (NEURONTIN) 300 MG capsule TAKE 2 CAPSULES BY MOUTH  THREE TIMES A DAY Patient taking differently: Take 600 mg by mouth 3 times daily 10/26/17  Yes Hulan Saas M, DO  lamoTRIgine (LAMICTAL) 200 MG tablet TAKE 2 TABLETS (400 MG TOTAL) BY MOUTH DAILY. Patient taking differently: Take 400 mg by mouth daily.  07/01/17  Yes Nche, Charlene Brooke, NP  lansoprazole (PREVACID) 15 MG capsule Take 15 mg by mouth daily as needed (for heartburn or acid reflux).    Yes [provider]  losartan (COZAAR) 50 MG tablet Take 1 tablet (50 mg total) by mouth daily. 07/01/17  Yes Nche, Charlene Brooke, NP  Tetrahydrozoline HCl (VISINE OP) Place 1 drop into both eyes at bedtime as needed (for allergies).   Yes [provider]  traMADol (ULTRAM) 50 MG tablet TAKE TWO TABLETS BY MOUTH EVERY 12 HOURS AS NEEDED 11/01/17   Lyndal Pulley, DO    Allergies  Allergen Reactions  . Demerol Nausea And Vomiting  . Ibuprofen Other (See Comments)    Gastritis   . Morphine And Related Nausea And Vomiting  . Codeine Itching     Review of Systems:              General:                      normal appetite, decreased energy, no weight gain, no weight loss, no fever             Cardiac:                       + chest pain with exertion, no chest pain at rest, +SOB with exertion, no resting SOB, no PND, no orthopnea, no palpitations, no arrhythmia, no atrial fibrillation, no LE edema, no dizzy spells, no syncope             Respiratory:                 no shortness of breath, no home oxygen, no productive cough, intermittent dry cough, no bronchitis, no wheezing, no hemoptysis, + asthma, no pain with inspiration or cough, no sleep apnea, no CPAP at night             GI:                               no difficulty swallowing, no reflux, no frequent heartburn, no hiatal hernia, no abdominal pain, no constipation, no diarrhea, no hematochezia, no hematemesis, no melena             GU:                              no dysuria,  no frequency, no urinary tract infection,  no hematuria, no kidney stones, no kidney disease             Vascular:                     no pain suggestive of claudication, no pain in feet, no leg cramps, no varicose veins, no DVT, no non-healing foot ulcer             Neuro:                         no stroke, no TIA's, no seizures, no headaches, no temporary blindness one eye,  no slurred speech, no peripheral neuropathy, no chronic pain, no instability of gait, no memory/cognitive dysfunction             Musculoskeletal:         mild arthritis, no joint swelling, no myalgias, no difficulty walking, normal mobility              Skin:                            no rash, no itching, no skin infections, no pressure sores  or ulcerations             Psych:                         + anxiety, + depression, + nervousness, + unusual recent stress             Eyes:                           no blurry vision, no floaters, no recent vision changes, does not wears glasses or contacts             ENT:                            + hearing loss, no loose or painful teeth, no dentures, last saw dentist within the past few months             Hematologic:               no easy bruising, no abnormal bleeding, no clotting disorder, no frequent epistaxis             Endocrine:                   no diabetes, does not check CBG's at [facility]eno                           Physical Exam:              BP 98/62   Pulse 72   Ht 5' 0.75" (1.543 m)   Wt 112 lb (50.8 kg)   SpO2 98%   BMI 21.34 kg/m              General:                        well-appearing             HEENT:                       Unremarkable              Neck:                           no JVD, no bruits, no adenopathy              Chest:                          clear to auscultation, symmetrical breath sounds, no wheezes, no rhonchi              CV:                              RRR, grade IV/VI crescendo/decrescendo systolic murmur              Abdomen:                    soft, non-tender, no masses               Extremities:                 warm, well-perfused, pulses palpable, no  LE edema             Rectal/GU                   Deferred             Neuro:                         Grossly non-focal and symmetrical throughout             Skin:                            Clean and dry, no rashes, no breakdown   Diagnostic Tests:  Transthoracic Echocardiography  Patient: Donn, Wilmot MR #: [id number] Study Date: 09/09/2017 Gender: F Age: 27 Height: 152.4 cm Weight: 53.4 kg BSA: 1.51 m^2 Pt. Status: Room:  SONOGRAPHER Cindy Hazy, RDCS ATTENDING Sanda Klein, MD Windell Moment, Marlane Hatcher REFERRING Burtis Junes PERFORMING [facility], Outpatient REFERRING Wilfred Lacy Lum  cc:  ------------------------------------------------------------------- LV EF: 55% - 60%  ------------------------------------------------------------------- Indications: I35.0 Aortic Stenosis.  ------------------------------------------------------------------- History: PMH: Acquired from the patient and from the patient&'s chart. PMH: Asthma. Anxiety. Aortic Stenosis-Bicuspid Aortic Valve. Risk factors: Alcohol Abuse. Current tobacco use. Hypertension. Dyslipidemia.  ------------------------------------------------------------------- Study Conclusions  - Left ventricle: The cavity size was normal. Wall thickness was normal. Systolic function was normal. The estimated ejection fraction was in the range of 55% to 60%. Wall motion was normal; there were no regional wall motion abnormalities. - Aortic valve: Bicuspid; severely thickened, severely calcified leaflets. Posterior cusp mobility was severely restricted. There was severe stenosis.  Impressions:  - Aortic valve gradients have worsened compared to last year and the ejection jet is mid  peaking.  ------------------------------------------------------------------- Study data: Study status: Routine. Procedure: The patient reported no pain pre or post test. Transthoracic echocardiography for left ventricular function evaluation, for right ventricular function evaluation, and for assessment of valvular function. Image quality was adequate. Study completion: There were no complications. Transthoracic echocardiography. M-mode, complete 2D, spectral Doppler, and color Doppler. Birthdate: Patient birthdate: 03-05-1981. Age: Patient is 36 yr old. Sex: Gender: female. BMI: 23 kg/m^2. Blood pressure: 110/80 Patient status: Outpatient. Study date: Study date: 09/09/2017. Study time: 07:56 AM. Location: [facility]  -------------------------------------------------------------------  ------------------------------------------------------------------- Left ventricle: The cavity size was normal. Wall thickness was normal. Systolic function was normal. The estimated ejection fraction was in the range of 55% to 60%. Wall motion was normal; there were no regional wall motion abnormalities.  ------------------------------------------------------------------- Aortic valve: Bicuspid; severely thickened, severely calcified leaflets. Posterior cusp mobility was severely restricted. Doppler: There was severe stenosis. There was no regurgitation. VTI ratio of LVOT to aortic valve: 0.28. Valve area (VTI): 0.97 cm^2. Indexed valve area (VTI): 0.64 cm^2/m^2. Peak velocity ratio of LVOT to aortic valve: 0.26. Valve area (Vmax): 0.9 cm^2. Indexed valve area (Vmax): 0.6 cm^2/m^2. Mean velocity ratio of LVOT to aortic valve: 0.25. Valve area (Vmean): 0.85 cm^2. Indexed valve area (Vmean): 0.56 cm^2/m^2. Mean gradient (S): 45 mm Hg. Peak gradient (S): 77 mm Hg.  ------------------------------------------------------------------- Aorta:  Aortic root: The aortic root was normal in size.  ------------------------------------------------------------------- Mitral valve: Structurally normal valve. Leaflet separation was normal. Mobility was not restricted. Doppler: Transvalvular velocity was within the normal range. There was no evidence for stenosis. There was trivial regurgitation. Peak gradient (D): 5 mm Hg.  ------------------------------------------------------------------- Left atrium: The atrium was normal in size.  -------------------------------------------------------------------  Right ventricle: The cavity size was normal. Wall thickness was normal. Systolic function was normal.  ------------------------------------------------------------------- Pulmonic valve: Poorly visualized. The valve appears to be grossly normal. Doppler: Transvalvular velocity was within the normal range. There was no evidence for stenosis. There was trivial regurgitation.  ------------------------------------------------------------------- Tricuspid valve: Structurally normal valve. Doppler: Transvalvular velocity was within the normal range. There was no regurgitation.  ------------------------------------------------------------------- Pulmonary artery: The main pulmonary artery was normal-sized. Systolic pressure was within the normal range.  ------------------------------------------------------------------- Right atrium: The atrium was normal in size.  ------------------------------------------------------------------- Pericardium: There was no pericardial effusion.  ------------------------------------------------------------------- Systemic veins: Inferior vena cava: The vessel was normal in size.  ------------------------------------------------------------------- Measurements  Left ventricle Value Reference LV ID, ED, PLAX chordal  (L) 41.3 mm 43 - 52 LV ID, ES, PLAX chordal (L) 19.9 mm 23 - 38 LV fx shortening, PLAX chordal 52 % >=29 LV PW thickness, ED 11.2 mm --------- IVS/LV PW ratio, ED 0.98 <=1.3 Stroke volume, 2D 90 ml --------- Stroke volume/bsa, 2D 60 ml/m^2 --------- LV e&', lateral 12.5 cm/s --------- LV E/e&', lateral 9.04 --------- LV e&', medial 10.2 cm/s --------- LV E/e&', medial 11.08 --------- LV e&', average 11.35 cm/s --------- LV E/e&', average 9.96 ---------  Ventricular septum Value Reference IVS thickness, ED 11 mm ---------  LVOT Value Reference LVOT ID, S 21 mm --------- LVOT area 3.46 cm^2 --------- LVOT ID 21 mm --------- LVOT peak velocity, S 113.52 cm/s --------- LVOT mean velocity, S 78.5 cm/s --------- LVOT VTI, S 28.45 cm --------- LVOT peak gradient, S 5 mm Hg --------- Stroke volume (SV), LVOT DP 98.5 ml --------- Stroke index (SV/bsa), LVOT DP 65.2 ml/m^2 ---------  Aortic valve Value Reference Aortic valve peak velocity, S 440 cm/s --------- Aortic valve mean velocity, S 319 cm/s --------- Aortic valve VTI, S  100 cm --------- Aortic mean gradient, S 45 mm Hg --------- Aortic peak gradient, S 77 mm Hg --------- VTI ratio, LVOT/AV 0.28 --------- Aortic valve area, VTI 0.97 cm^2 --------- Aortic valve area/bsa, VTI 0.64 cm^2/m^2 --------- Velocity ratio, peak, LVOT/AV 0.26 --------- Aortic valve area, peak velocity 0.9 cm^2 --------- Aortic valve area/bsa, peak 0.6 cm^2/m^2 --------- velocity Velocity ratio, mean, LVOT/AV 0.25 --------- Aortic valve area, mean velocity 0.85 cm^2 --------- Aortic valve area/bsa, mean 0.56 cm^2/m^2 --------- velocity  Aorta Value Reference Aortic root ID, ED 28 mm --------- Ascending aorta ID, A-P, S 34 mm ---------  Left atrium Value Reference LA ID, A-P, ES 33 mm --------- LA ID/bsa, A-P 2.18 cm/m^2 <=2.2 LA volume, S 35 ml --------- LA volume/bsa, S 23.1 ml/m^2 --------- LA volume, ES, 1-p A4C 39 ml --------- LA volume/bsa, ES, 1-p A4C 25.8 ml/m^2 --------- LA volume, ES, 1-p A2C 28 ml --------- LA volume/bsa, ES, 1-p A2C 18.5 ml/m^2 ---------  Mitral valve Value Reference Mitral E-wave peak velocity 113 cm/s --------- Mitral A-wave peak velocity 58.7 cm/s --------- Mitral deceleration time 218 ms 150 - 230 Mitral  peak gradient, D 5 mm Hg --------- Mitral E/A ratio, peak 1.9 ---------  Pulmonary arteries Value Reference PA pressure, S, DP 19 mm Hg <=30  Tricuspid valve Value Reference Tricuspid regurg peak velocity 197 cm/s --------- Tricuspid peak RV-RA gradient 16 mm Hg ---------  Systemic veins Value Reference Estimated CVP 3 mm Hg ---------  Right ventricle Value Reference RV pressure, S, DP 19 mm Hg <=30 RV s&', lateral, S 13.6 cm/s ---------  Legend: (L) and (H) mark values outside specified reference range.  ------------------------------------------------------------------- Prepared and Electronically Authenticated by  Dani Gobble  Croitoru, MD 2018-10-11T15:19:56   Cardiac CTA  MEDICATIONS: Sub lingual nitro. 4mg  and lopressor 10mg   TECHNIQUE: The patient was scanned on a Siemens 329 slice scanner. Gantry rotation speed was 250 msecs. Collimation was . 6 mm . A 100 kV retrospective scan was triggered in the descending thoracic aorta at 140 HU's average HR during the scan was bpm. The 3D data set was interpreted on a dedicated work station using MPR, MIP and VRT modes. A total of 80cc of contrast was used.  FINDINGS: Non-cardiac: See separate report from [facility]. No significant findings on limited lung and soft tissue windows.  Coronary Arteries: Right dominant with no anomalies  LM: Side by side LAD/Circumflex ostia no LM segment  LAD: Normal  D1: Normal  D2: Normal  Circumflex: Dominant normal  OM1: Normal  OM2: Normal  OM3: Normal  RCA: Normal  PDA: Left  sided normal  PLA: Left sided normal  Aorta: Bovine arch no PDA no aneurysm  Root: 3.1 cm  STJ: 2.7 cm  Arch: 1.8 cm  Descending Thoracic: 1.8 cm  Bicuspid Aortic Valve Fused right and left cusps. Heavy calcification of non coronary cusp. Heavy sub annular calcification  Min/Max: 23 mm x 15.7 mm  Annular Area: 348 mm2  IMPRESSION: 1) Normal left dominant coronary arteries  2) Normal aortic root 3.1 cm with bovine arch  3) Severely calcified bicuspid aortic valve Severe AS by echo gradients with mean over 44 mmHg Annulus measures 348 mm2  Jenkins Rouge   Electronically Signed By: Jenkins Rouge M.D. On: 09/27/2017 12:03   Impression:  Patient has a congenitally bicuspid aortic valve with stage D severe symptomatic aortic stenosis. She has recently developed progressive symptoms of exertional fatigue, shortness of breath, and chest discomfort. I have personally reviewed the patient's recent transthoracic echocardiogram and cardiac gated CT angiogram of the heart. The patient has a Sievers type 0 bicuspid aortic valve with severe stenosis. There is severe calcification on one side with extremely limited leaflet mobility. Peak velocity across the aortic valve measured 4.4 m/s corresponding to mean transvalvular gradient estimated 45 mmHg. There is normal left ventricular systolic function. There do not appear to be any other significant complicating features and the aortic annulus appears large enough to allow placement of at least a 21 mm prosthetic valve. 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 Coronary CT reveals widely patent coronary arteries with no significant coronary artery disease.Previous diagnostic cardiac catheterization also revealed no signs of any significant coronary artery disease or an anomalous coronary  circulation. I agree the patient needs elective aortic valve replacement. She appears to be a reasonably good candidate for minimally invasive approach for surgery.  Plan:  The patient and her mother were again counseled regarding the indications, risks, potential benefits of aortic valve replacement, this time with use of an interpreter to facilitate communication with the patient's mother.  Surgical options were discussed at length including conventional surgical aortic valve replacement through either median sternotomy or using minimally invasive techniques.  Discussion was held comparing the relative risks of mechanical valve replacement with need for lifelong anticoagulation versus use of a bioprosthetic tissue valve and the associated potential for late structural valve deterioration and failure. This discussion was placed in the context of the patient's particular circumstances, and as a result the patient specifically requests that their valve be replaced using a mechanical prosthetic valve.Expectations for  her postoperative convalescence at been discussed.  They understand and accept all potential risks of surgery including but not limited to risk of death, stroke or other neurologic complication, myocardial infarction, congestive heart failure, respiratory failure, renal failure, bleeding requiring transfusion and/or reexploration, arrhythmia, infection or other wound complications, pneumonia, pleural and/or pericardial effusion, pulmonary embolus, aortic dissection or other major vascular complication, or delayed complications related to valve repair or replacement including but not limited to structural valve deterioration and failure, thrombosis, embolization, endocarditis, or paravalvular leak.  All of their questions have been answered.   I spent in excess of 15 minutes during the conduct of this office consultation and >50% of this time involved direct face-to-face encounter with the  patient for counseling and/or coordination of their care.    Valentina Gu. Roxy Manns, MD 11/01/2017 3:38 PM

## 2017-11-03 ENCOUNTER — Inpatient Hospital Stay (HOSPITAL_COMMUNITY): Payer: Managed Care, Other (non HMO)

## 2017-11-03 ENCOUNTER — Inpatient Hospital Stay (HOSPITAL_COMMUNITY)
Admission: RE | Admit: 2017-11-03 | Discharge: 2017-11-09 | DRG: 220 | Disposition: A | Discharge status: Home / Self Care | Payer: Managed Care, Other (non HMO) | Source: Ambulatory Visit | Attending: Thoracic Surgery (Cardiothoracic Vascular Surgery) | Admitting: Thoracic Surgery (Cardiothoracic Vascular Surgery)

## 2017-11-03 ENCOUNTER — Encounter (HOSPITAL_COMMUNITY): Payer: Self-pay | Admitting: *Deleted

## 2017-11-03 ENCOUNTER — Inpatient Hospital Stay (HOSPITAL_COMMUNITY): Payer: Managed Care, Other (non HMO) | Admitting: Certified Registered Nurse Anesthetist

## 2017-11-03 ENCOUNTER — Encounter (HOSPITAL_COMMUNITY)
Admission: RE | Disposition: A | Discharge status: Home / Self Care | Payer: Self-pay | Source: Ambulatory Visit | Attending: Thoracic Surgery (Cardiothoracic Vascular Surgery)

## 2017-11-03 DIAGNOSIS — F1011 Alcohol abuse, in remission: Secondary | ICD-10-CM | POA: Diagnosis present

## 2017-11-03 DIAGNOSIS — Z90710 Acquired absence of both cervix and uterus: Secondary | ICD-10-CM | POA: Diagnosis not present

## 2017-11-03 DIAGNOSIS — F17210 Nicotine dependence, cigarettes, uncomplicated: Secondary | ICD-10-CM | POA: Diagnosis present

## 2017-11-03 DIAGNOSIS — J4530 Mild persistent asthma, uncomplicated: Secondary | ICD-10-CM | POA: Diagnosis present

## 2017-11-03 DIAGNOSIS — Z720 Tobacco use: Secondary | ICD-10-CM | POA: Diagnosis present

## 2017-11-03 DIAGNOSIS — Z8349 Family history of other endocrine, nutritional and metabolic diseases: Secondary | ICD-10-CM

## 2017-11-03 DIAGNOSIS — J9811 Atelectasis: Secondary | ICD-10-CM | POA: Diagnosis not present

## 2017-11-03 DIAGNOSIS — Z86001 Personal history of in-situ neoplasm of cervix uteri: Secondary | ICD-10-CM | POA: Diagnosis not present

## 2017-11-03 DIAGNOSIS — I350 Nonrheumatic aortic (valve) stenosis: Secondary | ICD-10-CM | POA: Diagnosis present

## 2017-11-03 DIAGNOSIS — Z8249 Family history of ischemic heart disease and other diseases of the circulatory system: Secondary | ICD-10-CM | POA: Diagnosis not present

## 2017-11-03 DIAGNOSIS — Z7901 Long term (current) use of anticoagulants: Secondary | ICD-10-CM | POA: Diagnosis not present

## 2017-11-03 DIAGNOSIS — Z79899 Other long term (current) drug therapy: Secondary | ICD-10-CM

## 2017-11-03 DIAGNOSIS — E785 Hyperlipidemia, unspecified: Secondary | ICD-10-CM | POA: Diagnosis present

## 2017-11-03 DIAGNOSIS — M1990 Unspecified osteoarthritis, unspecified site: Secondary | ICD-10-CM | POA: Diagnosis present

## 2017-11-03 DIAGNOSIS — E8770 Fluid overload, unspecified: Secondary | ICD-10-CM | POA: Diagnosis not present

## 2017-11-03 DIAGNOSIS — F319 Bipolar disorder, unspecified: Secondary | ICD-10-CM | POA: Diagnosis present

## 2017-11-03 DIAGNOSIS — Z79891 Long term (current) use of opiate analgesic: Secondary | ICD-10-CM | POA: Diagnosis not present

## 2017-11-03 DIAGNOSIS — Q2381 Bicuspid aortic valve: Secondary | ICD-10-CM

## 2017-11-03 DIAGNOSIS — I10 Essential (primary) hypertension: Secondary | ICD-10-CM | POA: Diagnosis present

## 2017-11-03 DIAGNOSIS — D6959 Other secondary thrombocytopenia: Secondary | ICD-10-CM | POA: Diagnosis not present

## 2017-11-03 DIAGNOSIS — F411 Generalized anxiety disorder: Secondary | ICD-10-CM | POA: Diagnosis present

## 2017-11-03 DIAGNOSIS — Z885 Allergy status to narcotic agent status: Secondary | ICD-10-CM | POA: Diagnosis not present

## 2017-11-03 DIAGNOSIS — D62 Acute posthemorrhagic anemia: Secondary | ICD-10-CM | POA: Diagnosis not present

## 2017-11-03 DIAGNOSIS — Q230 Congenital stenosis of aortic valve: Secondary | ICD-10-CM

## 2017-11-03 DIAGNOSIS — Z886 Allergy status to analgesic agent status: Secondary | ICD-10-CM

## 2017-11-03 DIAGNOSIS — Z954 Presence of other heart-valve replacement: Secondary | ICD-10-CM

## 2017-11-03 DIAGNOSIS — Q231 Congenital insufficiency of aortic valve: Secondary | ICD-10-CM

## 2017-11-03 DIAGNOSIS — F1190 Opioid use, unspecified, uncomplicated: Secondary | ICD-10-CM | POA: Diagnosis present

## 2017-11-03 HISTORY — PX: TEE WITHOUT CARDIOVERSION: SHX5443

## 2017-11-03 HISTORY — PX: AORTIC VALVE REPLACEMENT: SHX41

## 2017-11-03 HISTORY — DX: Presence of other heart-valve replacement: Z95.4

## 2017-11-03 LAB — CREATININE, SERUM
Creatinine, Ser: 0.64 mg/dL (ref 0.44–1.00)
GFR calc non Af Amer: >60 mL/min (ref >60)

## 2017-11-03 LAB — POCT I-STAT 3, ART BLOOD GAS (G3+)
ACID-BASE DEFICIT: 2 mmol/L (ref 0.0–2.0)
ACID-BASE DEFICIT: 5 mmol/L — AB (ref 0.0–2.0)
Acid-base deficit: 2 mmol/L (ref 0.0–2.0)
Acid-base deficit: 4 mmol/L — ABNORMAL HIGH (ref 0.0–2.0)
Acid-base deficit: 7 mmol/L — ABNORMAL HIGH (ref 0.0–2.0)
BICARBONATE: 24.3 mmol/L (ref 20.0–28.0)
BICARBONATE: 21.6 mmol/L (ref 20.0–28.0)
BICARBONATE: 18.7 mmol/L — AB (ref 20.0–28.0)
Bicarbonate: 22.8 mmol/L (ref 20.0–28.0)
Bicarbonate: 21.9 mmol/L (ref 20.0–28.0)
O2 SAT: 100 %
O2 SAT: 98 %
O2 Saturation: 100 %
O2 Saturation: 97 %
O2 Saturation: 99 %
PCO2 ART: 42.7 mmHg (ref 32.0–48.0)
PCO2 ART: 43.5 mmHg (ref 32.0–48.0)
PH ART: 7.394 (ref 7.350–7.450)
PH ART: 7.312 — AB (ref 7.350–7.450)
PH ART: 7.317 — AB (ref 7.350–7.450)
TCO2: 26 mmol/L (ref 22–32)
TCO2: 24 mmol/L (ref 22–32)
TCO2: 23 mmol/L (ref 22–32)
TCO2: 23 mmol/L (ref 22–32)
TCO2: 20 mmol/L — ABNORMAL LOW (ref 22–32)
pCO2 arterial: 47.9 mmHg (ref 32.0–48.0)
pCO2 arterial: 37.3 mmHg (ref 32.0–48.0)
pCO2 arterial: 36.7 mmHg (ref 32.0–48.0)
pH, Arterial: 7.313 — ABNORMAL LOW (ref 7.350–7.450)
pH, Arterial: 7.305 — ABNORMAL LOW (ref 7.350–7.450)
pO2, Arterial: 383 mmHg — ABNORMAL HIGH (ref 83.0–108.0)
pO2, Arterial: 483 mmHg — ABNORMAL HIGH (ref 83.0–108.0)
pO2, Arterial: 105 mmHg (ref 83.0–108.0)
pO2, Arterial: 121 mmHg — ABNORMAL HIGH (ref 83.0–108.0)
pO2, Arterial: 144 mmHg — ABNORMAL HIGH (ref 83.0–108.0)

## 2017-11-03 LAB — POCT I-STAT, CHEM 8
BUN: 10 mg/dL (ref 6–20)
BUN: 10 mg/dL (ref 6–20)
BUN: 11 mg/dL (ref 6–20)
BUN: 11 mg/dL (ref 6–20)
BUN: 11 mg/dL (ref 6–20)
CALCIUM ION: 1.09 mmol/L — AB (ref 1.15–1.40)
CHLORIDE: 105 mmol/L (ref 101–111)
CREATININE: 0.4 mg/dL — AB (ref 0.44–1.00)
Calcium, Ion: 1.02 mmol/L — ABNORMAL LOW (ref 1.15–1.40)
Calcium, Ion: 1.05 mmol/L — ABNORMAL LOW (ref 1.15–1.40)
Calcium, Ion: 1.1 mmol/L — ABNORMAL LOW (ref 1.15–1.40)
Calcium, Ion: 1.12 mmol/L — ABNORMAL LOW (ref 1.15–1.40)
Chloride: 102 mmol/L (ref 101–111)
Chloride: 104 mmol/L (ref 101–111)
Chloride: 104 mmol/L (ref 101–111)
Chloride: 104 mmol/L (ref 101–111)
Creatinine, Ser: 0.5 mg/dL (ref 0.44–1.00)
Creatinine, Ser: 0.4 mg/dL — ABNORMAL LOW (ref 0.44–1.00)
Creatinine, Ser: 0.6 mg/dL (ref 0.44–1.00)
Creatinine, Ser: 0.4 mg/dL — ABNORMAL LOW (ref 0.44–1.00)
Glucose, Bld: 123 mg/dL — ABNORMAL HIGH (ref 65–99)
Glucose, Bld: 151 mg/dL — ABNORMAL HIGH (ref 65–99)
Glucose, Bld: 133 mg/dL — ABNORMAL HIGH (ref 65–99)
Glucose, Bld: 133 mg/dL — ABNORMAL HIGH (ref 65–99)
Glucose, Bld: 118 mg/dL — ABNORMAL HIGH (ref 65–99)
HEMATOCRIT: 31 % — AB (ref 36.0–46.0)
HEMATOCRIT: 27 % — AB (ref 36.0–46.0)
HEMATOCRIT: 26 % — AB (ref 36.0–46.0)
HEMATOCRIT: 28 % — AB (ref 36.0–46.0)
HEMATOCRIT: 31 % — AB (ref 36.0–46.0)
HEMOGLOBIN: 9.2 g/dL — AB (ref 12.0–15.0)
HEMOGLOBIN: 8.8 g/dL — AB (ref 12.0–15.0)
HEMOGLOBIN: 10.5 g/dL — AB (ref 12.0–15.0)
Hemoglobin: 10.5 g/dL — ABNORMAL LOW (ref 12.0–15.0)
Hemoglobin: 9.5 g/dL — ABNORMAL LOW (ref 12.0–15.0)
POTASSIUM: 3.5 mmol/L (ref 3.5–5.1)
POTASSIUM: 4.5 mmol/L (ref 3.5–5.1)
Potassium: 4.2 mmol/L (ref 3.5–5.1)
Potassium: 4 mmol/L (ref 3.5–5.1)
Potassium: 4.7 mmol/L (ref 3.5–5.1)
SODIUM: 139 mmol/L (ref 135–145)
SODIUM: 139 mmol/L (ref 135–145)
SODIUM: 140 mmol/L (ref 135–145)
SODIUM: 136 mmol/L (ref 135–145)
Sodium: 138 mmol/L (ref 135–145)
TCO2: 27 mmol/L (ref 22–32)
TCO2: 26 mmol/L (ref 22–32)
TCO2: 24 mmol/L (ref 22–32)
TCO2: 25 mmol/L (ref 22–32)
TCO2: 20 mmol/L — AB (ref 22–32)

## 2017-11-03 LAB — GLUCOSE, CAPILLARY
Glucose-Capillary: 91 mg/dL (ref 65–99)
Glucose-Capillary: 103 mg/dL — ABNORMAL HIGH (ref 65–99)
Glucose-Capillary: 107 mg/dL — ABNORMAL HIGH (ref 65–99)
Glucose-Capillary: 103 mg/dL — ABNORMAL HIGH (ref 65–99)

## 2017-11-03 LAB — CBC
HCT: 31.3 % — ABNORMAL LOW (ref 36.0–46.0)
HEMATOCRIT: 32.6 % — AB (ref 36.0–46.0)
Hemoglobin: 10.5 g/dL — ABNORMAL LOW (ref 12.0–15.0)
Hemoglobin: 10.9 g/dL — ABNORMAL LOW (ref 12.0–15.0)
MCH: 31 pg (ref 26.0–34.0)
MCH: 30.4 pg (ref 26.0–34.0)
MCHC: 33.5 g/dL (ref 30.0–36.0)
MCHC: 33.4 g/dL (ref 30.0–36.0)
MCV: 92.3 fL (ref 78.0–100.0)
MCV: 91.1 fL (ref 78.0–100.0)
PLATELETS: 82 10*3/uL — AB (ref 150–400)
Platelets: 95 10*3/uL — ABNORMAL LOW (ref 150–400)
RBC: 3.39 MIL/uL — ABNORMAL LOW (ref 3.87–5.11)
RBC: 3.58 MIL/uL — ABNORMAL LOW (ref 3.87–5.11)
RDW: 13.4 % (ref 11.5–15.5)
RDW: 13.3 % (ref 11.5–15.5)
WBC: 6.6 10*3/uL (ref 4.0–10.5)
WBC: 8.7 10*3/uL (ref 4.0–10.5)

## 2017-11-03 LAB — HEMOGLOBIN AND HEMATOCRIT, BLOOD
HCT: 27.5 % — ABNORMAL LOW (ref 36.0–46.0)
HEMOGLOBIN: 9.5 g/dL — AB (ref 12.0–15.0)

## 2017-11-03 LAB — POCT ACTIVATED CLOTTING TIME
Activated Clotting Time: 114 seconds
Activated Clotting Time: 406 seconds
Activated Clotting Time: 516 seconds
Activated Clotting Time: 70 seconds

## 2017-11-03 LAB — POCT I-STAT 4, (NA,K, GLUC, HGB,HCT)
GLUCOSE: 96 mg/dL (ref 65–99)
HEMATOCRIT: 28 % — AB (ref 36.0–46.0)
HEMOGLOBIN: 9.5 g/dL — AB (ref 12.0–15.0)
POTASSIUM: 3.7 mmol/L (ref 3.5–5.1)
SODIUM: 141 mmol/L (ref 135–145)

## 2017-11-03 LAB — PROTIME-INR
INR: 1.2
PROTHROMBIN TIME: 15.1 s (ref 11.4–15.2)

## 2017-11-03 LAB — MAGNESIUM: Magnesium: 3.3 mg/dL — ABNORMAL HIGH (ref 1.7–2.4)

## 2017-11-03 LAB — APTT: APTT: 34 s (ref 24–36)

## 2017-11-03 LAB — PLATELET COUNT: Platelets: 120 10*3/uL — ABNORMAL LOW (ref 150–400)

## 2017-11-03 SURGERY — REPLACEMENT, AORTIC VALVE, MINIMALLY INVASIVE
Anesthesia: General | Site: Chest

## 2017-11-03 MED ORDER — CHLORHEXIDINE GLUCONATE CLOTH 2 % EX PADS
6.0000 | MEDICATED_PAD | Freq: Every day | CUTANEOUS | Status: DC
  Administered 2017-11-04 – 2017-11-08 (×5): 6 via TOPICAL

## 2017-11-03 MED ORDER — FENTANYL CITRATE (PF) 250 MCG/5ML IJ SOLN
INTRAMUSCULAR | Status: AC
  Filled 2017-11-03: qty 25

## 2017-11-03 MED ORDER — LACTATED RINGERS IV SOLN
INTRAVENOUS | Status: DC
  Administered 2017-11-03: 20 mL via INTRAVENOUS

## 2017-11-03 MED ORDER — IPRATROPIUM-ALBUTEROL 0.5-2.5 (3) MG/3ML IN SOLN: 3.0000 mL | Freq: Four times a day (QID) | RESPIRATORY_TRACT | Status: DC | PRN

## 2017-11-03 MED ORDER — LACTATED RINGERS IV SOLN
500.0000 mL | Freq: Once | INTRAVENOUS | Status: AC | PRN
  Administered 2017-11-03: 500 mL via INTRAVENOUS

## 2017-11-03 MED ORDER — DOCUSATE SODIUM 100 MG PO CAPS
200.0000 mg | ORAL_CAPSULE | Freq: Every day | ORAL | Status: DC
  Administered 2017-11-04 – 2017-11-09 (×6): 200 mg via ORAL
  Filled 2017-11-03 (×6): qty 2

## 2017-11-03 MED ORDER — SODIUM CHLORIDE 0.9% FLUSH: 3.0000 mL | INTRAVENOUS | Status: DC | PRN

## 2017-11-03 MED ORDER — LACTATED RINGERS IV SOLN
INTRAVENOUS | Status: DC
  Administered 2017-11-03: 20 mL via INTRAVENOUS
  Administered 2017-11-04: 20:00:00 via INTRAVENOUS

## 2017-11-03 MED ORDER — MAGNESIUM SULFATE 4 GM/100ML IV SOLN
4.0000 g | Freq: Once | INTRAVENOUS | Status: AC
  Administered 2017-11-03: 4 g via INTRAVENOUS

## 2017-11-03 MED ORDER — ACETAMINOPHEN 160 MG/5ML PO SOLN: 1000.0000 mg | Freq: Four times a day (QID) | ORAL | Status: DC

## 2017-11-03 MED ORDER — OXYCODONE HCL 5 MG PO TABS
5.0000 mg | ORAL_TABLET | ORAL | Status: DC | PRN
  Administered 2017-11-04: 10 mg via ORAL
  Administered 2017-11-04: 5 mg via ORAL
  Administered 2017-11-04 – 2017-11-06 (×9): 10 mg via ORAL
  Administered 2017-11-06: 5 mg via ORAL
  Administered 2017-11-06 – 2017-11-09 (×14): 10 mg via ORAL
  Filled 2017-11-03 (×22): qty 2
  Filled 2017-11-03 (×2): qty 1
  Filled 2017-11-03 (×2): qty 2

## 2017-11-03 MED ORDER — SODIUM CHLORIDE 0.9 % IV SOLN
INTRAVENOUS | Status: DC
  Administered 2017-11-03: 0.7 [IU]/h via INTRAVENOUS
  Filled 2017-11-03: qty 1

## 2017-11-03 MED ORDER — INSULIN REGULAR BOLUS VIA INFUSION
0.0000 [IU] | Freq: Three times a day (TID) | INTRAVENOUS | Status: DC
  Filled 2017-11-03: qty 10

## 2017-11-03 MED ORDER — SODIUM CHLORIDE 0.9 % IV SOLN: INTRAVENOUS | Status: DC

## 2017-11-03 MED ORDER — SODIUM CHLORIDE 0.9 % IV SOLN
INTRAVENOUS | Status: DC
  Administered 2017-11-03: 16:00:00 via INTRAVENOUS

## 2017-11-03 MED ORDER — CHLORHEXIDINE GLUCONATE 0.12 % MT SOLN
15.0000 mL | OROMUCOSAL | Status: AC
  Administered 2017-11-03: 15 mL via OROMUCOSAL

## 2017-11-03 MED ORDER — ACETAMINOPHEN 650 MG RE SUPP: 650.0000 mg | Freq: Once | RECTAL | Status: AC

## 2017-11-03 MED ORDER — MORPHINE SULFATE (PF) 2 MG/ML IV SOLN: 1.0000 mg | INTRAVENOUS | Status: DC | PRN

## 2017-11-03 MED ORDER — GABAPENTIN 300 MG PO CAPS
300.0000 mg | ORAL_CAPSULE | Freq: Three times a day (TID) | ORAL | Status: DC
  Administered 2017-11-04 – 2017-11-09 (×16): 300 mg via ORAL
  Filled 2017-11-03 (×16): qty 1

## 2017-11-03 MED ORDER — ASPIRIN EC 325 MG PO TBEC
325.0000 mg | DELAYED_RELEASE_TABLET | Freq: Every day | ORAL | Status: DC
  Administered 2017-11-04: 325 mg via ORAL
  Filled 2017-11-03: qty 1

## 2017-11-03 MED ORDER — LACTATED RINGERS IV SOLN
INTRAVENOUS | Status: DC | PRN
  Administered 2017-11-03: 08:00:00 via INTRAVENOUS

## 2017-11-03 MED ORDER — SODIUM CHLORIDE 0.9% FLUSH: 10.0000 mL | INTRAVENOUS | Status: DC | PRN

## 2017-11-03 MED ORDER — PROTAMINE SULFATE 10 MG/ML IV SOLN
INTRAVENOUS | Status: DC | PRN
  Administered 2017-11-03: 10 mg via INTRAVENOUS
  Administered 2017-11-03: 170 mg via INTRAVENOUS

## 2017-11-03 MED ORDER — PROPOFOL 10 MG/ML IV BOLUS
INTRAVENOUS | Status: AC
  Filled 2017-11-03: qty 20

## 2017-11-03 MED ORDER — ALBUMIN HUMAN 5 % IV SOLN
INTRAVENOUS | Status: DC | PRN
  Administered 2017-11-03: 14:00:00 via INTRAVENOUS

## 2017-11-03 MED ORDER — ROCURONIUM BROMIDE 10 MG/ML (PF) SYRINGE
PREFILLED_SYRINGE | INTRAVENOUS | Status: DC | PRN
  Administered 2017-11-03 (×3): 50 mg via INTRAVENOUS
  Administered 2017-11-03: 30 mg via INTRAVENOUS
  Administered 2017-11-03: 50 mg via INTRAVENOUS

## 2017-11-03 MED ORDER — MIDAZOLAM HCL 5 MG/5ML IJ SOLN
INTRAMUSCULAR | Status: DC | PRN
  Administered 2017-11-03: 1 mg via INTRAVENOUS
  Administered 2017-11-03 (×3): 2 mg via INTRAVENOUS
  Administered 2017-11-03: 3 mg via INTRAVENOUS

## 2017-11-03 MED ORDER — VANCOMYCIN HCL IN DEXTROSE 1-5 GM/200ML-% IV SOLN
1000.0000 mg | Freq: Once | INTRAVENOUS | Status: AC
  Administered 2017-11-03: 1000 mg via INTRAVENOUS
  Filled 2017-11-03: qty 200

## 2017-11-03 MED ORDER — SODIUM CHLORIDE 0.45 % IV SOLN: INTRAVENOUS | Status: DC | PRN

## 2017-11-03 MED ORDER — ACETAMINOPHEN 500 MG PO TABS
1000.0000 mg | ORAL_TABLET | Freq: Four times a day (QID) | ORAL | Status: AC
  Administered 2017-11-03 – 2017-11-08 (×19): 1000 mg via ORAL
  Filled 2017-11-03 (×19): qty 2

## 2017-11-03 MED ORDER — PANTOPRAZOLE SODIUM 40 MG PO TBEC
40.0000 mg | DELAYED_RELEASE_TABLET | Freq: Every day | ORAL | Status: DC
  Administered 2017-11-05 – 2017-11-09 (×5): 40 mg via ORAL
  Filled 2017-11-03 (×5): qty 1

## 2017-11-03 MED ORDER — BISACODYL 10 MG RE SUPP
10.0000 mg | Freq: Every day | RECTAL | Status: DC
  Filled 2017-11-03: qty 1

## 2017-11-03 MED ORDER — FENTANYL CITRATE (PF) 250 MCG/5ML IJ SOLN
INTRAMUSCULAR | Status: AC
  Filled 2017-11-03: qty 5

## 2017-11-03 MED ORDER — MAGNESIUM SULFATE 4 GM/100ML IV SOLN
INTRAVENOUS | Status: AC
  Filled 2017-11-03: qty 100

## 2017-11-03 MED ORDER — BUSPIRONE HCL 15 MG PO TABS
30.0000 mg | ORAL_TABLET | Freq: Two times a day (BID) | ORAL | Status: DC
  Administered 2017-11-03 – 2017-11-09 (×12): 30 mg via ORAL
  Filled 2017-11-03 (×8): qty 2
  Filled 2017-11-03: qty 6
  Filled 2017-11-03 (×6): qty 2

## 2017-11-03 MED ORDER — POTASSIUM CHLORIDE 10 MEQ/50ML IV SOLN
10.0000 meq | INTRAVENOUS | Status: AC
  Administered 2017-11-03 (×3): 10 meq via INTRAVENOUS

## 2017-11-03 MED ORDER — MORPHINE SULFATE (PF) 4 MG/ML IV SOLN
1.0000 mg | INTRAVENOUS | Status: DC | PRN
  Administered 2017-11-03: 2 mg via INTRAVENOUS
  Administered 2017-11-04: 3 mg via INTRAVENOUS
  Filled 2017-11-03: qty 1

## 2017-11-03 MED ORDER — HEPARIN SODIUM (PORCINE) 1000 UNIT/ML IJ SOLN
INTRAMUSCULAR | Status: DC | PRN
  Administered 2017-11-03: 18000 [IU] via INTRAVENOUS

## 2017-11-03 MED ORDER — CEFUROXIME SODIUM 1.5 G IV SOLR
1.5000 g | Freq: Two times a day (BID) | INTRAVENOUS | Status: AC
  Administered 2017-11-03 – 2017-11-05 (×4): 1.5 g via INTRAVENOUS
  Filled 2017-11-03 (×4): qty 1.5

## 2017-11-03 MED ORDER — BISACODYL 5 MG PO TBEC
10.0000 mg | DELAYED_RELEASE_TABLET | Freq: Every day | ORAL | Status: DC
  Administered 2017-11-04 – 2017-11-09 (×5): 10 mg via ORAL
  Filled 2017-11-03 (×7): qty 2

## 2017-11-03 MED ORDER — MIDAZOLAM HCL 10 MG/2ML IJ SOLN
INTRAMUSCULAR | Status: AC
  Filled 2017-11-03: qty 2

## 2017-11-03 MED ORDER — ALBUMIN HUMAN 5 % IV SOLN
250.0000 mL | INTRAVENOUS | Status: AC | PRN
  Administered 2017-11-03: 250 mL via INTRAVENOUS
  Filled 2017-11-03: qty 250

## 2017-11-03 MED ORDER — MIDAZOLAM HCL 2 MG/2ML IJ SOLN: 2.0000 mg | INTRAMUSCULAR | Status: DC | PRN

## 2017-11-03 MED ORDER — FENTANYL CITRATE (PF) 250 MCG/5ML IJ SOLN
INTRAMUSCULAR | Status: DC | PRN
  Administered 2017-11-03 (×2): 150 ug via INTRAVENOUS
  Administered 2017-11-03: 50 ug via INTRAVENOUS
  Administered 2017-11-03: 100 ug via INTRAVENOUS
  Administered 2017-11-03: 250 ug via INTRAVENOUS
  Administered 2017-11-03: 150 ug via INTRAVENOUS
  Administered 2017-11-03: 50 ug via INTRAVENOUS
  Administered 2017-11-03: 100 ug via INTRAVENOUS
  Administered 2017-11-03: 150 ug via INTRAVENOUS
  Administered 2017-11-03: 100 ug via INTRAVENOUS
  Administered 2017-11-03: 150 ug via INTRAVENOUS
  Administered 2017-11-03: 100 ug via INTRAVENOUS

## 2017-11-03 MED ORDER — SODIUM CHLORIDE 0.9% FLUSH
3.0000 mL | Freq: Two times a day (BID) | INTRAVENOUS | Status: DC
  Administered 2017-11-04 – 2017-11-06 (×3): 3 mL via INTRAVENOUS

## 2017-11-03 MED ORDER — TRAMADOL HCL 50 MG PO TABS
50.0000 mg | ORAL_TABLET | ORAL | Status: DC | PRN
  Administered 2017-11-05 (×2): 50 mg via ORAL
  Administered 2017-11-06 – 2017-11-09 (×14): 100 mg via ORAL
  Filled 2017-11-03: qty 2
  Filled 2017-11-03: qty 1
  Filled 2017-11-03 (×2): qty 2
  Filled 2017-11-03: qty 1
  Filled 2017-11-03 (×12): qty 2

## 2017-11-03 MED ORDER — CHLORHEXIDINE GLUCONATE 4 % EX LIQD: 30.0000 mL | CUTANEOUS | Status: DC

## 2017-11-03 MED ORDER — METOPROLOL TARTRATE 5 MG/5ML IV SOLN: 2.5000 mg | INTRAVENOUS | Status: DC | PRN

## 2017-11-03 MED ORDER — ACETAMINOPHEN 160 MG/5ML PO SOLN
650.0000 mg | Freq: Once | ORAL | Status: AC
  Administered 2017-11-03: 650 mg

## 2017-11-03 MED ORDER — SODIUM CHLORIDE 0.9 % IV SOLN
0.0000 ug/kg/h | INTRAVENOUS | Status: DC
  Administered 2017-11-03: 0.7 ug/kg/h via INTRAVENOUS
  Filled 2017-11-03: qty 2

## 2017-11-03 MED ORDER — METOPROLOL TARTRATE 25 MG/10 ML ORAL SUSPENSION: 12.5000 mg | Freq: Two times a day (BID) | ORAL | Status: DC

## 2017-11-03 MED ORDER — ONDANSETRON HCL 4 MG/2ML IJ SOLN
4.0000 mg | Freq: Four times a day (QID) | INTRAMUSCULAR | Status: DC | PRN
  Administered 2017-11-03 – 2017-11-04 (×2): 4 mg via INTRAVENOUS
  Filled 2017-11-03 (×2): qty 2

## 2017-11-03 MED ORDER — SODIUM CHLORIDE 0.9 % IV SOLN
0.0000 ug/min | INTRAVENOUS | Status: DC
  Filled 2017-11-03: qty 2

## 2017-11-03 MED ORDER — CHLORHEXIDINE GLUCONATE 0.12 % MT SOLN
15.0000 mL | Freq: Once | OROMUCOSAL | Status: AC
  Administered 2017-11-03: 15 mL via OROMUCOSAL
  Filled 2017-11-03: qty 15

## 2017-11-03 MED ORDER — METOPROLOL TARTRATE 12.5 MG HALF TABLET
12.5000 mg | ORAL_TABLET | Freq: Two times a day (BID) | ORAL | Status: DC
  Administered 2017-11-04 – 2017-11-09 (×9): 12.5 mg via ORAL
  Filled 2017-11-03 (×10): qty 1

## 2017-11-03 MED ORDER — ASPIRIN 81 MG PO CHEW: 324.0000 mg | CHEWABLE_TABLET | Freq: Every day | ORAL | Status: DC

## 2017-11-03 MED ORDER — LAMOTRIGINE 100 MG PO TABS
400.0000 mg | ORAL_TABLET | Freq: Every day | ORAL | Status: DC
  Administered 2017-11-04 – 2017-11-06 (×3): 400 mg via ORAL
  Filled 2017-11-03 (×2): qty 4
  Filled 2017-11-03: qty 2
  Filled 2017-11-03: qty 4

## 2017-11-03 MED ORDER — 0.9 % SODIUM CHLORIDE (POUR BTL) OPTIME
TOPICAL | Status: DC | PRN
  Administered 2017-11-03: 1000 mL

## 2017-11-03 MED ORDER — MORPHINE SULFATE (PF) 4 MG/ML IV SOLN
1.0000 mg | INTRAVENOUS | Status: DC | PRN
  Administered 2017-11-03 – 2017-11-05 (×9): 2 mg via INTRAVENOUS
  Filled 2017-11-03 (×11): qty 1

## 2017-11-03 MED ORDER — PROPOFOL 10 MG/ML IV BOLUS
INTRAVENOUS | Status: DC | PRN
  Administered 2017-11-03: 140 mg via INTRAVENOUS

## 2017-11-03 MED ORDER — METOPROLOL TARTRATE 12.5 MG HALF TABLET
12.5000 mg | ORAL_TABLET | Freq: Once | ORAL | Status: AC
  Administered 2017-11-03: 12.5 mg via ORAL
  Filled 2017-11-03: qty 1

## 2017-11-03 MED ORDER — INSULIN ASPART 100 UNIT/ML ~~LOC~~ SOLN
0.0000 [IU] | SUBCUTANEOUS | Status: DC
  Administered 2017-11-04 – 2017-11-05 (×6): 2 [IU] via SUBCUTANEOUS

## 2017-11-03 MED ORDER — NITROGLYCERIN IN D5W 200-5 MCG/ML-% IV SOLN: 0.0000 ug/min | INTRAVENOUS | Status: DC

## 2017-11-03 MED ORDER — SODIUM CHLORIDE 0.9% FLUSH
10.0000 mL | Freq: Two times a day (BID) | INTRAVENOUS | Status: DC
  Administered 2017-11-03 – 2017-11-05 (×3): 10 mL

## 2017-11-03 MED ORDER — SODIUM CHLORIDE 0.9 % IV SOLN: 250.0000 mL | INTRAVENOUS | Status: DC

## 2017-11-03 MED ORDER — MUPIROCIN 2 % EX OINT
1.0000 "application " | TOPICAL_OINTMENT | Freq: Once | CUTANEOUS | Status: AC
  Administered 2017-11-03: 1 via TOPICAL
  Filled 2017-11-03: qty 22

## 2017-11-03 MED ORDER — FAMOTIDINE IN NACL 20-0.9 MG/50ML-% IV SOLN
20.0000 mg | Freq: Two times a day (BID) | INTRAVENOUS | Status: AC
  Administered 2017-11-03 (×2): 20 mg via INTRAVENOUS
  Filled 2017-11-03: qty 50

## 2017-11-03 SURGICAL SUPPLY — 95 items
ADAPTER CARDIO PERF ANTE/RETRO (ADAPTER) ×3 IMPLANT
ADH SKN CLS APL DERMABOND .7 (GAUZE/BANDAGES/DRESSINGS) ×2
ADPR PRFSN 84XANTGRD RTRGD (ADAPTER) ×2
APPLICATOR COTTON TIP 6IN STRL (MISCELLANEOUS) ×1 IMPLANT
BAG DECANTER FOR FLEXI CONT (MISCELLANEOUS) ×3 IMPLANT
BATTERY MAXDRIVER (MISCELLANEOUS) ×2 IMPLANT
BOOT SUTURE AID YELLOW STND (SUTURE) ×1 IMPLANT
CANISTER SUCT 3000ML PPV (MISCELLANEOUS) ×3 IMPLANT
CANNULA FEM VENOUS REMOTE 22FR (CANNULA) ×1 IMPLANT
CANNULA GUNDRY RCSP 15FR (MISCELLANEOUS) ×3 IMPLANT
CANNULA OPTISITE PERFUSION 16F (CANNULA) ×1 IMPLANT
CANNULA OPTISITE PERFUSION 18F (CANNULA) IMPLANT
CANNULA SUMP PERICARDIAL (CANNULA) ×3 IMPLANT
CATH ENDOVENT PULMONARY (CATHETERS) IMPLANT
CATH HEART VENT LEFT (CATHETERS) ×2 IMPLANT
CATH RETROPLEGIA CORONARY 14FR (CATHETERS) ×1 IMPLANT
CELLS DAT CNTRL 66122 CELL SVR (MISCELLANEOUS) ×2 IMPLANT
CONN ST 1/4X3/8  BEN (MISCELLANEOUS) ×2
CONN ST 1/4X3/8 BEN (MISCELLANEOUS) ×4 IMPLANT
CONNECTOR 1/2X3/8X1/2 3 WAY (MISCELLANEOUS) ×1
CONNECTOR 1/2X3/8X1/2 3WAY (MISCELLANEOUS) ×2 IMPLANT
CONT SPEC 4OZ CLIKSEAL STRL BL (MISCELLANEOUS) ×3 IMPLANT
COVER BACK TABLE 24X17X13 BIG (DRAPES) ×3 IMPLANT
CRADLE DONUT ADULT HEAD (MISCELLANEOUS) ×3 IMPLANT
DERMABOND ADVANCED (GAUZE/BANDAGES/DRESSINGS) ×1
DERMABOND ADVANCED .7 DNX12 (GAUZE/BANDAGES/DRESSINGS) ×4 IMPLANT
DEVICE PMI PUNCTURE CLOSURE (MISCELLANEOUS) ×3 IMPLANT
DEVICE SUT CK QUICK LOAD MINI (Prosthesis & Implant Heart) ×2 IMPLANT
DEVICE TROCAR PUNCTURE CLOSURE (ENDOMECHANICALS) ×3 IMPLANT
DRAIN CHANNEL 32F RND 10.7 FF (WOUND CARE) ×6 IMPLANT
DRAPE BILATERAL SPLIT (DRAPES) ×3 IMPLANT
DRAPE CV SPLIT W-CLR ANES SCRN (DRAPES) ×3 IMPLANT
DRAPE INCISE IOBAN 66X45 STRL (DRAPES) ×8 IMPLANT
DRAPE SLUSH/WARMER DISC (DRAPES) ×3 IMPLANT
ELECT BLADE 4.0 EZ CLEAN MEGAD (MISCELLANEOUS) ×3
ELECT BLADE 6.5 EXT (BLADE) ×3 IMPLANT
ELECT REM PT RETURN 9FT ADLT (ELECTROSURGICAL) ×6
ELECTRODE BLDE 4.0 EZ CLN MEGD (MISCELLANEOUS) ×2 IMPLANT
ELECTRODE REM PT RTRN 9FT ADLT (ELECTROSURGICAL) ×4 IMPLANT
FELT TEFLON 1X6 (MISCELLANEOUS) ×5 IMPLANT
FEMORAL VENOUS CANN RAP (CANNULA) IMPLANT
GAUZE SPONGE 4X4 12PLY STRL (GAUZE/BANDAGES/DRESSINGS) ×3 IMPLANT
GAUZE SPONGE 4X4 12PLY STRL LF (GAUZE/BANDAGES/DRESSINGS) ×1 IMPLANT
GLOVE ORTHO TXT STRL SZ7.5 (GLOVE) ×9 IMPLANT
GOWN STRL REUS W/ TWL LRG LVL3 (GOWN DISPOSABLE) ×8 IMPLANT
GOWN STRL REUS W/TWL LRG LVL3 (GOWN DISPOSABLE) ×12
KIT BASIN OR (CUSTOM PROCEDURE TRAY) ×3 IMPLANT
KIT CATH SUCT 8FR (CATHETERS) ×3 IMPLANT
KIT DILATOR VASC 18G NDL (KITS) ×3 IMPLANT
KIT DRAINAGE VACCUM ASSIST (KITS) ×1 IMPLANT
KIT ROOM TURNOVER OR (KITS) ×3 IMPLANT
KIT SUCTION CATH 14FR (SUCTIONS) ×3 IMPLANT
KIT SUT CK MINI COMBO 4X17 (Prosthesis & Implant Heart) ×1 IMPLANT
LEAD PACING MYOCARDI (MISCELLANEOUS) ×3 IMPLANT
LINE VENT (MISCELLANEOUS) ×1 IMPLANT
NDL AORTIC ROOT 14G 7F (CATHETERS) ×2 IMPLANT
NEEDLE AORTIC ROOT 14G 7F (CATHETERS) ×3 IMPLANT
NS IRRIG 1000ML POUR BTL (IV SOLUTION) ×15 IMPLANT
PACK OPEN HEART (CUSTOM PROCEDURE TRAY) ×3 IMPLANT
PAD ARMBOARD 7.5X6 YLW CONV (MISCELLANEOUS) ×6 IMPLANT
PAD ELECT DEFIB RADIOL ZOLL (MISCELLANEOUS) ×3 IMPLANT
PLATE RIB X SHAPE 20HOLE (Plate) ×1 IMPLANT
RETRACTOR WND ALEXIS 18 MED (MISCELLANEOUS) ×2 IMPLANT
RTRCTR WOUND ALEXIS 18CM MED (MISCELLANEOUS) ×3
SCREW RIB MAXDRIVE 2.3X7 STRL (Screw) IMPLANT
SCREW RIB MAXDRIVE 2.3X7MM (Screw) ×3 IMPLANT
SCREW STERNAL LOCK 2.3MM (Screw) ×7 IMPLANT
SET CANNULATION TOURNIQUET (MISCELLANEOUS) ×3 IMPLANT
SET CARDIOPLEGIA MPS 5001102 (MISCELLANEOUS) ×1 IMPLANT
SET IRRIG TUBING LAPAROSCOPIC (IRRIGATION / IRRIGATOR) ×3 IMPLANT
SOLUTION ANTI FOG 6CC (MISCELLANEOUS) ×3 IMPLANT
STRIP PERIGUARD 6X8 (Vascular Products) ×2 IMPLANT
SUT BONE WAX W31G (SUTURE) ×3 IMPLANT
SUT ETHIBON 2 0 V 52N 30 (SUTURE) ×2 IMPLANT
SUT ETHIBOND V-5 VALVE (SUTURE) ×6 IMPLANT
SUT GORETEX CV 4 TH 22 36 (SUTURE) ×3 IMPLANT
SUT GORETEX CV4 TH-18 (SUTURE) ×6 IMPLANT
SUT PROLENE 4 0 RB 1 (SUTURE) ×30
SUT PROLENE 4-0 RB1 .5 CRCL 36 (SUTURE) ×10 IMPLANT
SUT TEM PAC WIRE 2 0 SH (SUTURE) ×6 IMPLANT
SYSTEM SAHARA CHEST DRAIN ATS (WOUND CARE) ×3 IMPLANT
TAPE CLOTH SURG 4X10 WHT LF (GAUZE/BANDAGES/DRESSINGS) ×2 IMPLANT
TOWEL GREEN STERILE FF (TOWEL DISPOSABLE) ×6 IMPLANT
TOWEL OR 17X24 6PK STRL BLUE (TOWEL DISPOSABLE) ×3 IMPLANT
TOWEL OR 17X26 10 PK STRL BLUE (TOWEL DISPOSABLE) ×3 IMPLANT
TRAY FOLEY SILVER 16FR TEMP (SET/KITS/TRAYS/PACK) ×3 IMPLANT
TROCAR BLADELESS 11MM (ENDOMECHANICALS) ×1 IMPLANT
TROCAR XCEL BLADELESS 5X75MML (TROCAR) IMPLANT
TROCAR XCEL NON-BLD 11X100MML (ENDOMECHANICALS) ×6 IMPLANT
TUBE SUCT INTRACARD DLP 20F (MISCELLANEOUS) ×3 IMPLANT
UNDERPAD 30X30 (UNDERPADS AND DIAPERS) ×3 IMPLANT
VALVE AORTIC TOP HAT (Prosthesis & Implant Heart) ×3 IMPLANT
VALVE AORTIC TOP HAT 23 (Prosthesis & Implant Heart) IMPLANT
VENT LEFT HEART 12002 (CATHETERS) ×3
WATER STERILE IRR 1000ML POUR (IV SOLUTION) ×6 IMPLANT

## 2017-11-03 NOTE — Progress Notes (Signed)
      [address]       [place] [address]             [phone]      S/p RIght mini thoracotomy AVR  Waking up, ready to be extubated  BP 95/67   Pulse 81   Temp (!) 97.3 F (36.3 C)   Resp (!) 25   SpO2 100%   CI= 3.2  Doing well early postop  Terressa Evola C. Roxan Hockey, MD [facility] [phone]

## 2017-11-03 NOTE — Procedures (Signed)
Extubation Procedure Note  Patient Details:   Name: Danielle Obrien DOB: 1981-04-01 MRN: [id number]   Airway Documentation:     Evaluation  O2 sats: stable throughout Complications: No apparent complications Patient did tolerate procedure well. Bilateral Breath Sounds: Diminished, Clear   Yes   NIF - 22, VC 870 mL, positive cuff leak.  Pt placed on [place] 4 L with humidity, no stridor noted, pt able to reach 625 mL using incentive spirometer.  Bayard Beaver 11/03/2017, 6:33 PM

## 2017-11-03 NOTE — Anesthesia Procedure Notes (Signed)
Central Venous Catheter Insertion Performed by: Duane Boston, MD, anesthesiologist Start/End12/03/2017 8:06 AM, 11/03/2017 8:16 AM Patient location: Pre-op. Preanesthetic checklist: patient identified, IV checked, site marked, risks and benefits discussed, surgical consent, monitors and equipment checked, pre-op evaluation, timeout performed and anesthesia consent Position: Trendelenburg Lidocaine 1% used for infiltration and patient sedated Hand hygiene performed , maximum sterile barriers used  and Seldinger technique used Catheter size: 8.5 Fr Total catheter length 8. PA cath was placed.Sheath introducer Swan type:thermodilution PA Cath depth:50 Procedure performed using ultrasound guided technique. Ultrasound Notes:anatomy identified, needle tip was noted to be adjacent to the nerve/plexus identified, no ultrasound evidence of intravascular and/or intraneural injection and image(s) printed for medical record Attempts: 1 Following insertion, line sutured and dressing applied. Post procedure assessment: free fluid flow, blood return through all ports and no air  Patient tolerated the procedure well with no immediate complications.

## 2017-11-03 NOTE — Brief Op Note (Signed)
11/03/2017  1:32 PM  PATIENT:  Danielle Obrien  36 y.o. female  PRE-OPERATIVE DIAGNOSIS:  Severe Aortic Stenosis  POST-OPERATIVE DIAGNOSIS:  Severe aortic stenosis  PROCEDURE:  Procedure(s): MINIMALLY INVASIVE AORTIC VALVE REPLACEMENT( MINI THORACOTOMY) (N/A) WITH AORTIC ROOT ENLARGEMENT. TRANSESOPHAGEAL ECHOCARDIOGRAM (TEE) (N/A)  SURGEON:  Surgeon(s) and Role:    * Rexene Alberts, MD - Primary  PHYSICIAN ASSISTANT:  Nicholes Rough, PA-C   ANESTHESIA:   general  EBL:  TBD  BLOOD ADMINISTERED:none  DRAINS: ROUTINE   LOCAL MEDICATIONS USED:  NONE  SPECIMEN:  Source of Specimen:  AORTIC VALVE LEAFLETS  DISPOSITION OF SPECIMEN:  PATHOLOGY  COUNTS:  YES  DICTATION: .Dragon Dictation  PLAN OF CARE: Admit to inpatient   PATIENT DISPOSITION:  ICU - intubated and hemodynamically stable.   Delay start of Pharmacological VTE agent (>24hrs) due to surgical blood loss or risk of bleeding: yes

## 2017-11-03 NOTE — Op Note (Signed)
CARDIOTHORACIC SURGERY OPERATIVE NOTE  Date of Procedure:  11/03/2017  Preoperative Diagnosis: Severe Aortic Stenosis   Postoperative Diagnosis: Same   Procedure:    Minimally Invasive Aortic Valve Replacement Right Anterior Mini-thoracotomy Sorin Carbomedics Top Hat bileaflet mechanical valve (size 49mm, catalog [id number], serial [id number]) Bovine pericardial patch enlargement of the aortic root Plating of right 3rd costal cartilage (KLS titanium plate)   Surgeon: Valentina Gu. Roxy Manns, MD  Assistant: Nicholes Rough, PA-C  Anesthesia: Albertha Ghee, MD  Operative Findings:  Danielle Obrien type 0 bicuspid aortic valve  Severe aortic stenosis  Normal LV systolic function  Mild to moderate LV hypertrophy         BRIEF CLINICAL NOTE AND INDICATIONS FOR SURGERY  Patient is a 36 year old female with known history of bicuspid aortic valve and severe aortic stenosis,bipolar disorder with chronic anxiety, tobacco abuse, asthma, and arthritis. who returns to the office today for follow-up with hopes to proceed with elective aortic valve replacement in the near future. She was originally seen in consultation nearly 1 year ago on 10/12/2016. At that time she remained essentially asymptomatic and did not wish to proceed with elective surgical intervention. She states that over the past year she has developed significant decreased exercise tolerance with worsening fatigue and occasional episodes of substernal chest discomfort. She has also developed some mild exertional shortness of breath with more strenuous activity. She denies any resting shortness of breath or chest discomfort. She has not had dizzy spells or near syncope.  The patient has been seen in consultation and counseled at length regarding the indications, risks and potential benefits of surgery.  All questions have been answered, and the patient provides full informed consent for the operation as described.     DETAILS OF THE  OPERATIVE PROCEDURE  Preparation:  The patient is brought to the operating room on the above mentioned date and central monitoring was established by the anesthesia team including placement of Swan-Ganz catheter through the left internal jugular vein.  A radial arterial line is placed. The patient is placed in the supine position on the operating table.  Intravenous antibiotics are administered. General endotracheal anesthesia is induced uneventfully. The patient is initially intubated using a dual lumen endotracheal tube.  A Foley catheter is placed.  Baseline transesophageal echocardiogram was performed.  Findings were notable for the presence of a congenitally bicuspid aortic valve with severe aortic stenosis.  1 of the 2 leaflets of the aortic valve was heavily calcified, thickened, and essentially immobile.  The other leaflet was much less diseased and only moderately thickened.  There was only trace aortic insufficiency.  Left ventricular systolic function was normal.  There was mild to moderate left ventricular hypertrophy.  There was trace central mitral regurgitation.  The patient is placed in the supine position with their neck gently extended and turned to the left.   The patient's right neck, chest, abdomen, both groins, and both lower extremities are prepared and draped in a sterile manner. A time out procedure is performed.   Surgical Approach:  A right miniature anteriorthoracotomy incision is performed. The incision is placed immediately over the 3rd intercostal space.  The muscle fibers of the pectoralis major muscle are split longitudinally and the right pleural space is entered through the 3rd intercostal space. The 3rd costal cartilage is divided at it's insertion onto the sternum.  The right internal mammary artery and veins are ligated and divided.  A specially designed rib retractor is placed.  Two 11 mm ports are  placed through separate stab incisions inferiorly. The right  pleural space is insufflated continuously with carbon dioxide gas through the posterior port during the remainder of the operation.     Extracorporeal Cardiopulmonary Bypass and Myocardial Protection:  A small incision is made in the right inguinal crease and the anterior surface of the right common femoral artery and right common femoral vein are identified.  The patient is placed in Trendelenburg position. The right internal jugular vein is cannulated with Seldinger technique and a guidewire advanced into the right atrium. The patient is heparinized systemically.  A 14 French pediatric femoral venous cannula is placed through the right internal jugular vein into the superior vena cava.   Pursestring sutures are placed on the anterior surface of the right common femoral vein and right common femoral artery. The right common femoral vein is cannulated with the Seldinger technique and a guidewire is advanced under transesophageal echocardiogram guidance through the right atrium. The femoral vein is cannulated with a long 22 French femoral venous cannula. The right common femoral artery is cannulated with Seldinger technique and a flexible guidewire is advanced until it can be appreciated intraluminally in the descending thoracic aorta on transesophageal echocardiogram. The femoral artery is cannulated with an 18 French femoral arterial cannula.  Adequate heparinization is verified.      The entire pre-bypass portion of the operation was notable for stable hemodynamics.  Cardiopulmonary bypass was begun.  Vacuum assist venous drainage is utilized. An incision is made in the pericardium over the ascending aorta and extended in both directions. Silk traction sutures are placed to retract the pericardium.  Venous drainage and exposure are notably excellent. A retrograde cardioplegia cannula is placed through the right atrium into the coronary sinus using transesophageal echocardiogram guidance.  A left  ventricular vent is placed through the right superior pulmonary vein.  An antegrade cardioplegia cannula is placed in the ascending aorta.    The patient is cooled to 32C systemic temperature.   The aortic cross clamp is applied and cardioplegia is delivered initially in an antegrade fashion through the aortic root using modified [place] cold blood cardioplegia (Kennestone blood cardioplegia protocol).   The initial cardioplegic arrest is rapid with early diastolic arrest.  Repeat doses of cardioplegia are administered at 90 minutes and every 30 minutes thereafter through the coronary sinus catheter in order to maintain completely flat electrocardiogram.  Myocardial protection was felt to be excellent.   Aortic Valve Replacement with Aortic Root Enlargement:  An oblique transverse aortotomy incision was performed.  The incision is continued down through the noncoronary sinus of Valsalva towards the aortic annulus.  The aortic valve was inspected and notable for Sievers type 0 bicuspid aortic valve with severe aortic stenosis.  The aortic valve leaflets were excised sharply and the aortic annulus decalcified.  Decalcification was notably straightforward.  The aortic annulus was sized to accept a 23 mm prosthesis.  However, due to the relatively small size of the sinotubular junction the aortic root itself appears tight with the valve sizer in place.  A decision is made to proceed with root enlargement to facilitate placement of a 23 mm valve.  The aortotomy is extended down through the aortic annulus.   The aortic root and left ventricle were irrigated with copious cold saline solution.  Aortic valve replacement was performed using interrupted horizontal mattress 2-0 Ethibond pledgeted sutures with pledgets in the subannular position.  A Sorin Oceanographer valve (size 23 mm, model #  S5-023, serial # [id number]) was implanted uneventfully. The valve seated appropriately with  adequate space beneath the left main and right coronary artery.  All sutures were secured using a Cor-knot device.  A bovine pericardial patch is rinsed and trimmed to an elliptical shaped to be utilized for enlargement of the aortic root.  Several interrupted horizontal mattress 4-0 Prolene pledgeted sutures were placed at the level of the aortic annulus to anchor the patch in place.  The remainder of the patch was utilized to enlarge the entire aortotomy incision using a 2 layer closure of running 4-0 Prolene suture.   Procedure Completion:  One final dose of warm retrograde "reanimation dose" cardioplegia was administered retrograde through the coronary sinus catheter while all air was evacuated through the aortic root.  The aortic cross clamp was removed after a total cross clamp time of 124 minutes.  Epicardial pacing wires are fixed to the right ventricular outflow tract and to the right atrial appendage. The patient is rewarmed to 37C temperature.  The pericardial sac was drained using a 28 French Bard drain placed through the anterior port incision.   The patient is weaned and disconnected from cardiopulmonary bypass.  The patient's rhythm at separation from bypass was sinus.  The patient was weaned from bypass without any inotropic support. Total cardiopulmonary bypass time for the operation was 174 minutes.  Followup transesophageal echocardiogram performed after separation from bypass revealed a well-seated bileaflet mechanical valve in the aortic position with no perivalvular leak.  Both leaflets of the valve are moving normally.  Mean transvalvular gradient across the aortic valve was estimated at 13-15 mmHg.  Left ventricular function was unchanged from preoperatively.    The femoral arterial and venous cannulae were removed uneventfully. There was a palpable pulse in the distal right common femoral artery after removal of the cannula.  Protamine was administered to reverse the  anticoagulation.   The right internal jugular venous cannula is removed and manual pressure held for 15 minutes.  Single lung ventilation was begun. The aortotomy closure was inspected for hemostasis. The right pleural space is irrigated with saline solution and inspected for hemostasis. The right pleural space was drained using a 28 French Bard drain placed through the posterior port incision. The 3rd costal cartilage is replaced using a small titanium plate.  The miniature thoracotomy incision was closed in multiple layers in routine fashion. The right groin incision was inspected for hemostasis and closed in multiple layers in routine fashion.   The post-bypass portion of the operation was notable for stable rhythm and hemodynamics.   No blood products were administered during the operation.   Disposition:  The patient tolerated the procedure well.  The patient was reintubated using a single lumen endotracheal tube and subsequently transported to the surgical intensive care unit in stable condition. There were no intraoperative complications. All sponge instrument and needle counts are verified correct at completion of the operation.     Valentina Gu. Roxy Manns MD 11/03/2017 2:42 PM

## 2017-11-03 NOTE — Interval H&P Note (Signed)
History and Physical Interval Note:  11/03/2017 7:25 AM  Danielle Obrien  has presented today for surgery, with the diagnosis of Severe Aortic Stenosis  The various methods of treatment have been discussed with the patient and family. After consideration of risks, benefits and other options for treatment, the patient has consented to  Procedure(s): MINIMALLY INVASIVE AORTIC VALVE REPLACEMENT( MINI THORACOTOMY) (N/A) TRANSESOPHAGEAL ECHOCARDIOGRAM (TEE) (N/A) as a surgical intervention .  The patient's history has been reviewed, patient examined, no change in status, stable for surgery.  I have reviewed the patient's chart and labs.  Questions were answered to the patient's satisfaction.     Rexene Alberts

## 2017-11-03 NOTE — Transfer of Care (Signed)
Immediate Anesthesia Transfer of Care Note  Patient: Danielle Obrien  Procedure(s) Performed: MINIMALLY INVASIVE AORTIC VALVE REPLACEMENT( MINI THORACOTOMY) (N/A Chest) TRANSESOPHAGEAL ECHOCARDIOGRAM (TEE) (N/A )  Patient Location: SICU  Anesthesia Type:General  Level of Consciousness: Patient remains intubated per anesthesia plan  Airway & Oxygen Therapy: Patient remains intubated per anesthesia plan  Post-op Assessment: Report given to RN and Post -op Vital signs reviewed and stable  Post vital signs: Reviewed and stable  Last Vitals:  Vitals:   11/03/17 0713  BP: 113/74  Pulse: 75  Resp: 20  Temp: 36.6 C  SpO2: 98%    Last Pain:  Vitals:   11/03/17 0713  TempSrc: Oral      Patients Stated Pain Goal: 3 (68/[id number])  Complications: No apparent anesthesia complications

## 2017-11-03 NOTE — Anesthesia Preprocedure Evaluation (Signed)
Anesthesia Evaluation  Patient identified by MRN, date of birth, ID band Patient awake    Reviewed: Allergy & Precautions, H&P , NPO status , Patient's Chart, lab work & pertinent test results  Airway Mallampati: II   Neck ROM: full    Dental   Pulmonary asthma , Current Smoker,    breath sounds clear to auscultation       Cardiovascular hypertension, + Valvular Problems/Murmurs AS  Rhythm:regular Rate:Normal  Bicuspid aortic valve with severe AS   Neuro/Psych  Headaches, PSYCHIATRIC DISORDERS Anxiety Bipolar Disorder    GI/Hepatic   Endo/Other    Renal/GU      Musculoskeletal  (+) Arthritis ,   Abdominal   Peds  Hematology   Anesthesia Other Findings   Reproductive/Obstetrics                             Anesthesia Physical Anesthesia Plan  ASA: III  Anesthesia Plan: General   Post-op Pain Management:    Induction: Intravenous  PONV Risk Score and Plan: 2 and Ondansetron, Dexamethasone, Midazolam and Treatment may vary due to age or medical condition  Airway Management Planned: Oral ETT  Additional Equipment:   Intra-op Plan: Utilization Of Total Body Hypothermia per surgeon request  Post-operative Plan: Post-operative intubation/ventilation  Informed Consent: I have reviewed the patients History and Physical, chart, labs and discussed the procedure including the risks, benefits and alternatives for the proposed anesthesia with the patient or authorized representative who has indicated his/her understanding and acceptance.     Plan Discussed with: CRNA, Anesthesiologist and Surgeon  Anesthesia Plan Comments:         Anesthesia Quick Evaluation

## 2017-11-03 NOTE — Progress Notes (Signed)
  Echocardiogram 2D Echocardiogram has been performed.  Danielle Obrien G Hanne Kegg 11/03/2017, 10:11 AM

## 2017-11-03 NOTE — Anesthesia Procedure Notes (Signed)
Arterial Line Insertion Start/End12/03/2017 8:00 AM, 11/03/2017 8:10 AM Performed by: CRNA  Patient location: Pre-op. Preanesthetic checklist: patient identified, IV checked, site marked, risks and benefits discussed, surgical consent, monitors and equipment checked, pre-op evaluation, timeout performed and anesthesia consent Lidocaine 1% used for infiltration Left, radial was placed Catheter size: 20 Fr Hand hygiene performed  and maximum sterile barriers used   Attempts: 2 Procedure performed without using ultrasound guided technique. Following insertion, dressing applied. Post procedure assessment: normal and unchanged

## 2017-11-03 NOTE — Anesthesia Procedure Notes (Signed)
Procedure Name: Intubation Date/Time: 11/03/2017 8:53 AM Performed by: Clearnce Sorrel, CRNA Pre-anesthesia Checklist: Patient identified, Emergency Drugs available, Suction available, Patient being monitored and Timeout performed Patient Re-evaluated:Patient Re-evaluated prior to induction Oxygen Delivery Method: Circle system utilized Preoxygenation: Pre-oxygenation with 100% oxygen Induction Type: IV induction Ventilation: Mask ventilation without difficulty Laryngoscope Size: Mac and 3 Grade View: Grade I Endobronchial tube: Double lumen EBT and 37 Fr Number of attempts: 1 Airway Equipment and Method: Stylet Placement Confirmation: ETT inserted through vocal cords under direct vision,  positive ETCO2 and breath sounds checked- equal and bilateral Secured at: 29 cm Tube secured with: Tape Dental Injury: Teeth and Oropharynx as per pre-operative assessment

## 2017-11-04 ENCOUNTER — Inpatient Hospital Stay (HOSPITAL_COMMUNITY): Payer: Managed Care, Other (non HMO)

## 2017-11-04 ENCOUNTER — Other Ambulatory Visit: Payer: Self-pay

## 2017-11-04 ENCOUNTER — Encounter (HOSPITAL_COMMUNITY): Payer: Self-pay | Admitting: Thoracic Surgery (Cardiothoracic Vascular Surgery)

## 2017-11-04 LAB — GLUCOSE, CAPILLARY
GLUCOSE-CAPILLARY: 122 mg/dL — AB (ref 65–99)
Glucose-Capillary: 117 mg/dL — ABNORMAL HIGH (ref 65–99)
Glucose-Capillary: 126 mg/dL — ABNORMAL HIGH (ref 65–99)
Glucose-Capillary: 127 mg/dL — ABNORMAL HIGH (ref 65–99)
Glucose-Capillary: 129 mg/dL — ABNORMAL HIGH (ref 65–99)
Glucose-Capillary: 131 mg/dL — ABNORMAL HIGH (ref 65–99)
Glucose-Capillary: 148 mg/dL — ABNORMAL HIGH (ref 65–99)

## 2017-11-04 LAB — CBC
HCT: 34.5 % — ABNORMAL LOW (ref 36.0–46.0)
HCT: 34.5 % — ABNORMAL LOW (ref 36.0–46.0)
HEMOGLOBIN: 11.9 g/dL — AB (ref 12.0–15.0)
Hemoglobin: 11.4 g/dL — ABNORMAL LOW (ref 12.0–15.0)
MCH: 31.2 pg (ref 26.0–34.0)
MCH: 30.1 pg (ref 26.0–34.0)
MCHC: 34.5 g/dL (ref 30.0–36.0)
MCHC: 33 g/dL (ref 30.0–36.0)
MCV: 90.6 fL (ref 78.0–100.0)
MCV: 91 fL (ref 78.0–100.0)
PLATELETS: 93 10*3/uL — AB (ref 150–400)
Platelets: 108 10*3/uL — ABNORMAL LOW (ref 150–400)
RBC: 3.81 MIL/uL — ABNORMAL LOW (ref 3.87–5.11)
RBC: 3.79 MIL/uL — AB (ref 3.87–5.11)
RDW: 13.4 % (ref 11.5–15.5)
RDW: 13.3 % (ref 11.5–15.5)
WBC: 10.5 10*3/uL (ref 4.0–10.5)
WBC: 8.2 10*3/uL (ref 4.0–10.5)

## 2017-11-04 LAB — MAGNESIUM
MAGNESIUM: 2.4 mg/dL (ref 1.7–2.4)
Magnesium: 2.7 mg/dL — ABNORMAL HIGH (ref 1.7–2.4)

## 2017-11-04 LAB — BLOOD GAS, ARTERIAL
Acid-base deficit: 4.5 mmol/L — ABNORMAL HIGH (ref 0.0–2.0)
Bicarbonate: 19.7 mmol/L — ABNORMAL LOW (ref 20.0–28.0)
Drawn by: 51133
O2 Content: 2 L/min
O2 Saturation: 90.8 %
Patient temperature: 98.6
pCO2 arterial: 33.9 mmHg (ref 32.0–48.0)
pH, Arterial: 7.381 (ref 7.350–7.450)
pO2, Arterial: 61.2 mmHg — ABNORMAL LOW (ref 83.0–108.0)

## 2017-11-04 LAB — POCT I-STAT, CHEM 8
BUN: 9 mg/dL (ref 6–20)
CHLORIDE: 98 mmol/L — AB (ref 101–111)
CREATININE: 0.7 mg/dL (ref 0.44–1.00)
Calcium, Ion: 1.18 mmol/L (ref 1.15–1.40)
Glucose, Bld: 107 mg/dL — ABNORMAL HIGH (ref 65–99)
HEMATOCRIT: 33 % — AB (ref 36.0–46.0)
Hemoglobin: 11.2 g/dL — ABNORMAL LOW (ref 12.0–15.0)
POTASSIUM: 4.2 mmol/L (ref 3.5–5.1)
Sodium: 136 mmol/L (ref 135–145)
TCO2: 27 mmol/L (ref 22–32)

## 2017-11-04 LAB — PROTIME-INR
INR: 1.1
Prothrombin Time: 14.2 seconds (ref 11.4–15.2)

## 2017-11-04 LAB — BASIC METABOLIC PANEL
Anion gap: 10 (ref 5–15)
BUN: 10 mg/dL (ref 6–20)
CO2: 20 mmol/L — ABNORMAL LOW (ref 22–32)
Calcium: 7.8 mg/dL — ABNORMAL LOW (ref 8.9–10.3)
Chloride: 102 mmol/L (ref 101–111)
Creatinine, Ser: 0.69 mg/dL (ref 0.44–1.00)
GFR calc Af Amer: >60 mL/min (ref >60)
GFR calc non Af Amer: >60 mL/min (ref >60)
Glucose, Bld: 115 mg/dL — ABNORMAL HIGH (ref 65–99)
Potassium: 4 mmol/L (ref 3.5–5.1)
Sodium: 132 mmol/L — ABNORMAL LOW (ref 135–145)

## 2017-11-04 LAB — CREATININE, SERUM
CREATININE: 0.79 mg/dL (ref 0.44–1.00)
GFR calc non Af Amer: >60 mL/min (ref >60)

## 2017-11-04 MED ORDER — ORAL CARE MOUTH RINSE
15.0000 mL | Freq: Two times a day (BID) | OROMUCOSAL | Status: DC
  Administered 2017-11-04 – 2017-11-06 (×2): 15 mL via OROMUCOSAL

## 2017-11-04 MED ORDER — FUROSEMIDE 10 MG/ML IJ SOLN
20.0000 mg | Freq: Once | INTRAMUSCULAR | Status: AC
  Administered 2017-11-04: 20 mg via INTRAVENOUS
  Filled 2017-11-04: qty 2

## 2017-11-04 MED ORDER — ALPRAZOLAM 0.5 MG PO TABS
0.5000 mg | ORAL_TABLET | Freq: Three times a day (TID) | ORAL | Status: DC | PRN
  Administered 2017-11-05 – 2017-11-09 (×9): 0.5 mg via ORAL
  Filled 2017-11-04 (×9): qty 1

## 2017-11-04 MED ORDER — POTASSIUM CHLORIDE 10 MEQ/50ML IV SOLN
INTRAVENOUS | Status: AC
  Administered 2017-11-04: 10 meq via INTRAVENOUS
  Filled 2017-11-04: qty 50

## 2017-11-04 MED ORDER — MUPIROCIN 2 % EX OINT
1.0000 "application " | TOPICAL_OINTMENT | Freq: Two times a day (BID) | CUTANEOUS | Status: AC
  Administered 2017-11-04 – 2017-11-08 (×10): 1 via NASAL
  Filled 2017-11-04 (×3): qty 22

## 2017-11-04 MED ORDER — WARFARIN - PHYSICIAN DOSING INPATIENT
Freq: Every day | Status: DC
  Administered 2017-11-04: 17:00:00

## 2017-11-04 MED ORDER — WARFARIN SODIUM 5 MG PO TABS
5.0000 mg | ORAL_TABLET | Freq: Every day | ORAL | Status: DC
  Administered 2017-11-04 – 2017-11-06 (×3): 5 mg via ORAL
  Filled 2017-11-04 (×3): qty 1

## 2017-11-04 MED ORDER — POTASSIUM CHLORIDE 10 MEQ/50ML IV SOLN
10.0000 meq | INTRAVENOUS | Status: AC
  Administered 2017-11-04 (×2): 10 meq via INTRAVENOUS
  Filled 2017-11-04: qty 50

## 2017-11-04 MED ORDER — KETOROLAC TROMETHAMINE 15 MG/ML IJ SOLN
15.0000 mg | Freq: Four times a day (QID) | INTRAMUSCULAR | Status: AC
  Administered 2017-11-04 – 2017-11-05 (×5): 15 mg via INTRAVENOUS
  Filled 2017-11-04 (×5): qty 1

## 2017-11-04 MED ORDER — CHLORHEXIDINE GLUCONATE CLOTH 2 % EX PADS: 6.0000 | MEDICATED_PAD | Freq: Every day | CUTANEOUS | Status: DC

## 2017-11-04 MED FILL — Lidocaine HCl IV Inj 20 MG/ML: INTRAVENOUS | Qty: 25 | Status: AC

## 2017-11-04 MED FILL — Heparin Sodium (Porcine) Inj 1000 Unit/ML: INTRAMUSCULAR | Qty: 30 | Status: AC

## 2017-11-04 MED FILL — Mannitol IV Soln 20%: INTRAVENOUS | Qty: 1000 | Status: AC

## 2017-11-04 NOTE — Care Management Note (Addendum)
Case Management Note  Patient Details  Name: [name] MRN: [id number] Date of Birth: 1981-11-10  Subjective/Objective:   From home with Mom and Dad who will be with her 24/7, POD 1 AVR, mini thorcotomy, has chest tube , cont to mobilize and diurese.   She has a PCP and medication coverage.                   Action/Plan: NCM will follow for dc needs.   Expected Discharge Date:                  Expected Discharge Plan:     In-House Referral:     Discharge planning Services  CM Consult  Post Acute Care Choice:    Choice offered to:     DME Arranged:    DME Agency:     HH Arranged:    HH Agency:     Status of Service:  In process, will continue to follow  If discussed at Long Length of Stay Meetings, dates discussed:    Additional Comments:  Zenon Mayo, RN 11/04/2017, 3:44 PM

## 2017-11-04 NOTE — Discharge Instructions (Addendum)
Information on my medicine - Coumadin   (Warfarin)  This medication education was reviewed with me or my healthcare representative as part of my discharge preparation.  The pharmacist that spoke with me during my hospital stay was:  Dareen Piano, [facility]  Why was Coumadin prescribed for you? Coumadin was prescribed for you because you have a blood clot or a medical condition that can cause an increased risk of forming blood clots. Blood clots can cause serious health problems by blocking the flow of blood to the heart, lung, or brain. Coumadin can prevent harmful blood clots from forming. As a reminder your indication for Coumadin is:   Blood Clot Prevention After Heart Valve Surgery  What test will check on my response to Coumadin? While on Coumadin (warfarin) you will need to have an INR test regularly to ensure that your dose is keeping you in the desired range. The INR (international normalized ratio) number is calculated from the result of the laboratory test called prothrombin time (PT).  If an INR APPOINTMENT HAS NOT ALREADY BEEN MADE FOR YOU please schedule an appointment to have this lab work done by your health care provider within 7 days. Your INR goal is usually a number between:  2 to 3 or your provider may give you a more narrow range like 2-2.5.  Ask your health care provider during an office visit what your goal INR is.  What  do you need to  know  About  COUMADIN? Take Coumadin (warfarin) exactly as prescribed by your healthcare provider about the same time each day.  DO NOT stop taking without talking to the doctor who prescribed the medication.  Stopping without other blood clot prevention medication to take the place of Coumadin may increase your risk of developing a new clot or stroke.  Get refills before you run out.  What do you do if you miss a dose? If you miss a dose, take it as soon as you remember on the same day then continue your regularly scheduled regimen the  next day.  Do not take two doses of Coumadin at the same time.  Important Safety Information A possible side effect of Coumadin (Warfarin) is an increased risk of bleeding. You should call your healthcare provider right away if you experience any of the following: ? Bleeding from an injury or your nose that does not stop. ? Unusual colored urine (red or dark brown) or unusual colored stools (red or black). ? Unusual bruising for unknown reasons. ? A serious fall or if you hit your head (even if there is no bleeding).  Some foods or medicines interact with Coumadin (warfarin) and might alter your response to warfarin. To help avoid this: ? Eat a balanced diet, maintaining a consistent amount of Vitamin K. ? Notify your provider about major diet changes you plan to make. ? Avoid alcohol or limit your intake to 1 drink for women and 2 drinks for men per day. (1 drink is 5 oz. wine, 12 oz. beer, or 1.5 oz. liquor.)  Make sure that ANY health care provider who prescribes medication for you knows that you are taking Coumadin (warfarin).  Also make sure the healthcare provider who is monitoring your Coumadin knows when you have started a new medication including herbals and non-prescription products.  Coumadin (Warfarin)  Major Drug Interactions  Increased Warfarin Effect Decreased Warfarin Effect  Alcohol (large quantities) Antibiotics (esp. Septra/Bactrim, Flagyl, Cipro) Amiodarone (Cordarone) Aspirin (ASA) Cimetidine (Tagamet) Megestrol (Megace) NSAIDs (  ibuprofen, naproxen, etc.) Piroxicam (Feldene) Propafenone (Rythmol SR) Propranolol (Inderal) Isoniazid (INH) Posaconazole (Noxafil) Barbiturates (Phenobarbital) Carbamazepine (Tegretol) Chlordiazepoxide (Librium) Cholestyramine (Questran) Griseofulvin Oral Contraceptives Rifampin Sucralfate (Carafate) Vitamin K   Coumadin (Warfarin) Major Herbal Interactions  Increased Warfarin Effect Decreased Warfarin Effect   Garlic Ginseng Ginkgo biloba Coenzyme Q10 Green tea St. Johns wort    Coumadin (Warfarin) FOOD Interactions  Eat a consistent number of servings per week of foods HIGH in Vitamin K (1 serving =  cup)  Collards (cooked, or boiled & drained) Kale (cooked, or boiled & drained) Mustard greens (cooked, or boiled & drained) Parsley *serving size only =  cup Spinach (cooked, or boiled & drained) Swiss chard (cooked, or boiled & drained) Turnip greens (cooked, or boiled & drained)  Eat a consistent number of servings per week of foods MEDIUM-HIGH in Vitamin K (1 serving = 1 cup)  Asparagus (cooked, or boiled & drained) Broccoli (cooked, boiled & drained, or raw & chopped) Brussel sprouts (cooked, or boiled & drained) *serving size only =  cup Lettuce, raw (green leaf, endive, romaine) Spinach, raw Turnip greens, raw & chopped   These websites have more information on Coumadin (warfarin):  FailFactory.se; VeganReport.com.au;       Aortic Valve Replacement Aortic valve replacement is surgical replacement of an aortic valve that cannot be repaired. This procedure may be done to replace:  A narrow aortic valve (aortic valve stenosis).  A deformed aortic valve (bicuspid aortic valve).  An aortic valve that does not close all the way (aortic insufficiency).  The aortic valve is replaced with artificial (prosthetic) valve. Three types of prosthetic valves are available:  Mechanical valves made entirely from man-made materials.  Donor valves from human donors. These are only used in special situations.  Biological valves made from animal tissues.  The type of prosthetic valve used will be determined based on various factors, including your age, your lifestyle, and other medical conditions you have. This procedure is done using an open surgical technique, meaning that a large incision will be made in your chest over your heart. Tell a health care provider  about:  Any allergies you have.  All medicines you are taking, including vitamins, herbs, eye drops, creams, and over-the-counter medicines.  Any problems you or family members have had with anesthetic medicines.  Any blood disorders you have.  Any surgeries you have had.  Any medical conditions you have.  Whether you are pregnant or may be pregnant. What are the risks? Generally, this is a safe procedure. However, problems may occur, including:  Infection.  Bleeding.  Allergic reactions to medicines.  Damage to other structures or organs.  Blood clotting caused by the prosthetic valve.  Failure of the prosthetic valve.  What happens before the procedure?  Ask your health care provider about: ? Changing or stopping your regular medicines. This is especially important if you are taking diabetes medicines or blood thinners. ? Taking medicines such as aspirin and ibuprofen. These medicines can thin your blood. Do not take these medicines before your procedure if your health care provider instructs you not to.  Follow instructions from your health care provider about eating or drinking restrictions.  You may be given antibiotic medicine to help prevent infection.  You may have tests, such as: ? Echocardiogram. ? Electrocardiogram (ECG). ? Blood tests. ? Urine tests.  Plan to have someone take you home when you leave the hospital.  Plan to have someone with you for at least 24 hours  after you leave the hospital. What happens during the procedure?  To reduce your risk of infection: ? Your health care team will wash or sanitize their hands. ? Your skin will be washed with soap.  An IV tube will be inserted into one of your veins.  You will be given one or more of the following: ? A medicine to help you relax (sedative). ? A medicine to make you fall asleep (general anesthetic).  You will be placed on a heart-lung bypass machine. This machine provides oxygen to  your blood while your heart is undergoing surgery.  An incision will be made in your chest, over your heart.  Your damaged aortic valve will be removed.  A prosthetic valve will be sewn into your heart.  Your incision will be closed with stitches (sutures), skin glue, or adhesive tape.  A bandage (dressing) will be placed over your incision. The procedure may vary among health care providers and hospitals. What happens after the procedure?  Your blood pressure, heart rate, breathing rate, and blood oxygen level will be monitored often until the medicines you were given have worn off.  Do not drive until your health care provider approves. This information is not intended to replace advice given to you by your health care provider. Make sure you discuss any questions you have with your health care provider. Document Released: 04/07/2005 Document Revised: 04/23/2016 Document Reviewed: 10/20/2015 Elsevier Interactive Patient Education  2017 [employer].

## 2017-11-04 NOTE — Progress Notes (Signed)
[facility] [address]       [place] [address]             [id number]        CARDIOTHORACIC SURGERY PROGRESS NOTE   R1 Day Post-Op Procedure(s) (LRB): MINIMALLY INVASIVE AORTIC VALVE REPLACEMENT( MINI THORACOTOMY) (N/A) TRANSESOPHAGEAL ECHOCARDIOGRAM (TEE) (N/A)  Subjective: Looks good.  Feels sore in chest.  Breathing comfortably w/ O2 sats 98% on 3 L/min  Objective: Vital signs: BP Readings from Last 1 Encounters:  11/04/17 115/68   Pulse Readings from Last 1 Encounters:  11/04/17 93   Resp Readings from Last 1 Encounters:  11/04/17 (!) 23   Temp Readings from Last 1 Encounters:  11/04/17 99.7 F (37.6 C)    Hemodynamics: PAP: (21-37)/(9-19) 30/9 CO:  [4.2 L/min-6.8 L/min] 6.2 L/min CI:  [2.9 L/min/m2-4.6 L/min/m2] 4.2 L/min/m2  Physical Exam:  Rhythm:   sinus  Breath sounds: clear  Heart sounds:  RRR w/ mechanical sounds  Incisions:  Clean and dry  Abdomen:  Soft, non-distended, non-tender  Extremities:  Warm, well-perfused  Chest tubes:  low volume thin serosanguinous output, no air leak    Intake/Output from previous day: 12/05 0701 - 12/06 0700 In: 5420.8 [I.V.:4120.8; Blood:200; IV Piggyback:1100] Out: 3662 [Urine:2600; Blood:200; Chest Tube:470] Intake/Output this shift: No intake/output data recorded.  Lab Results:  CBC: Recent Labs    11/03/17 2103 11/04/17 0349  WBC 8.7 10.5  HGB 10.9* 11.9*  HCT 32.6* 34.5*  PLT 95* 108*    BMET:  Recent Labs    11/01/17 1352  11/03/17 2100 11/03/17 2103 11/04/17 0349  NA 136   < > 136  --  132*  K 4.1   < > 4.7  --  4.0  CL 106   < > 104  --  102  CO2 19*  --   --   --  20*  GLUCOSE 94   < > 118*  --  115*  BUN 12   < > 11  --  10  CREATININE 0.64   < > 0.40* 0.64 0.69  CALCIUM 9.6  --   --   --  7.8*   < > = values in this interval not displayed.     PT/INR:   Recent Labs    11/04/17 0349  LABPROT 14.2  INR 1.10    CBG (last 3)  Recent Labs    11/03/17 1942  11/03/17 2356 11/04/17 0353  GLUCAP 103* 127* 117*    ABG    Component Value Date/Time   PHART 7.381 11/04/2017 0445   PCO2ART 33.9 11/04/2017 0445   PO2ART 61.2 (L) 11/04/2017 0445   HCO3 19.7 (L) 11/04/2017 0445   TCO2 20 (L) 11/03/2017 2100   ACIDBASEDEF 4.5 (H) 11/04/2017 0445   O2SAT 90.8 11/04/2017 0445    CXR: Looks good.  Clear.  Mild bibasilar ATX  EKG: NSR w/out acute ischemic changes    Assessment/Plan: S/P Procedure(s) (LRB): MINIMALLY INVASIVE AORTIC VALVE REPLACEMENT( MINI THORACOTOMY) (N/A) TRANSESOPHAGEAL ECHOCARDIOGRAM (TEE) (N/A)  Doing well POD1 Maintaining NSR w/ stable BP, no drips Breathing comfortably w/ O2 sats 98% on 3 L/min Expected post op acute blood loss anemia, mild Expected post op atelectasis, mild Expected post op volume excess, weight reportedly 14 lbs > preop Longstanding tobacco abuse Chronic anxiety, bipolar disorder   Mobilize  Diuresis  Pulm toilet  D/C lines D/C tubes later today or tomorrow, depending on output Start Coumadin   Danielle Obrien  Keturah Barre, MD 11/04/2017 7:47 AM

## 2017-11-04 NOTE — Progress Notes (Signed)
CT surgery p.m. Rounds  Feeling well sitting up in chair Room air, sinus rhythm, ambulating in hallway No nausea Chest tubes out

## 2017-11-05 ENCOUNTER — Inpatient Hospital Stay (HOSPITAL_COMMUNITY): Payer: Managed Care, Other (non HMO)

## 2017-11-05 LAB — BASIC METABOLIC PANEL
Anion gap: 6 (ref 5–15)
BUN: 9 mg/dL (ref 6–20)
CO2: 27 mmol/L (ref 22–32)
Calcium: 8 mg/dL — ABNORMAL LOW (ref 8.9–10.3)
Chloride: 103 mmol/L (ref 101–111)
Creatinine, Ser: 0.8 mg/dL (ref 0.44–1.00)
GFR calc Af Amer: >60 mL/min (ref >60)
GLUCOSE: 108 mg/dL — AB (ref 65–99)
POTASSIUM: 3.8 mmol/L (ref 3.5–5.1)
Sodium: 136 mmol/L (ref 135–145)

## 2017-11-05 LAB — CBC
HEMATOCRIT: 29.6 % — AB (ref 36.0–46.0)
Hemoglobin: 9.9 g/dL — ABNORMAL LOW (ref 12.0–15.0)
MCH: 31 pg (ref 26.0–34.0)
MCHC: 33.4 g/dL (ref 30.0–36.0)
MCV: 92.8 fL (ref 78.0–100.0)
Platelets: 78 10*3/uL — ABNORMAL LOW (ref 150–400)
RBC: 3.19 MIL/uL — ABNORMAL LOW (ref 3.87–5.11)
RDW: 13.5 % (ref 11.5–15.5)
WBC: 5.6 10*3/uL (ref 4.0–10.5)

## 2017-11-05 LAB — GLUCOSE, CAPILLARY
GLUCOSE-CAPILLARY: 107 mg/dL — AB (ref 65–99)
GLUCOSE-CAPILLARY: 89 mg/dL (ref 65–99)
Glucose-Capillary: 128 mg/dL — ABNORMAL HIGH (ref 65–99)
Glucose-Capillary: 131 mg/dL — ABNORMAL HIGH (ref 65–99)

## 2017-11-05 LAB — PROTIME-INR
INR: 1.17
Prothrombin Time: 14.8 seconds (ref 11.4–15.2)

## 2017-11-05 MED ORDER — FA-PYRIDOXINE-CYANOCOBALAMIN 2.5-25-2 MG PO TABS
1.0000 | ORAL_TABLET | Freq: Every day | ORAL | Status: DC
  Administered 2017-11-05 – 2017-11-09 (×5): 1 via ORAL
  Filled 2017-11-05 (×5): qty 1

## 2017-11-05 MED ORDER — MOVING RIGHT ALONG BOOK
Freq: Once | Status: DC
  Filled 2017-11-05: qty 1

## 2017-11-05 MED ORDER — POTASSIUM CHLORIDE 10 MEQ/50ML IV SOLN
10.0000 meq | INTRAVENOUS | Status: AC
  Administered 2017-11-05 (×3): 10 meq via INTRAVENOUS
  Filled 2017-11-05 (×3): qty 50

## 2017-11-05 MED ORDER — SODIUM CHLORIDE 0.9% FLUSH
3.0000 mL | INTRAVENOUS | Status: DC | PRN
  Administered 2017-11-06: 3 mL via INTRAVENOUS
  Filled 2017-11-05: qty 3

## 2017-11-05 MED ORDER — ASPIRIN EC 81 MG PO TBEC
81.0000 mg | DELAYED_RELEASE_TABLET | Freq: Every day | ORAL | Status: DC
  Administered 2017-11-05 – 2017-11-09 (×5): 81 mg via ORAL
  Filled 2017-11-05 (×5): qty 1

## 2017-11-05 MED ORDER — SODIUM CHLORIDE 0.9% FLUSH
3.0000 mL | Freq: Two times a day (BID) | INTRAVENOUS | Status: DC
  Administered 2017-11-05 – 2017-11-09 (×8): 3 mL via INTRAVENOUS

## 2017-11-05 MED ORDER — FUROSEMIDE 10 MG/ML IJ SOLN
20.0000 mg | Freq: Once | INTRAMUSCULAR | Status: DC
  Filled 2017-11-05: qty 2

## 2017-11-05 MED ORDER — POLYSACCHARIDE IRON COMPLEX 150 MG PO CAPS
150.0000 mg | ORAL_CAPSULE | Freq: Every day | ORAL | Status: DC
  Administered 2017-11-06 – 2017-11-09 (×4): 150 mg via ORAL
  Filled 2017-11-05 (×4): qty 1

## 2017-11-05 MED ORDER — SODIUM CHLORIDE 0.9 % IV SOLN: 250.0000 mL | INTRAVENOUS | Status: DC | PRN

## 2017-11-05 MED FILL — Heparin Sodium (Porcine) Inj 1000 Unit/ML: INTRAMUSCULAR | Qty: 2500 | Status: AC

## 2017-11-05 MED FILL — Potassium Chloride Inj 2 mEq/ML: INTRAVENOUS | Qty: 20 | Status: AC

## 2017-11-05 MED FILL — Magnesium Sulfate Inj 50%: INTRAMUSCULAR | Qty: 10 | Status: AC

## 2017-11-05 MED FILL — Heparin Sodium (Porcine) Inj 1000 Unit/ML: INTRAMUSCULAR | Qty: 30 | Status: AC

## 2017-11-05 NOTE — Discharge Summary (Signed)
Physician Discharge Summary  Patient ID: Danielle Obrien MRN: [id number] DOB/AGE: 1981-11-23 36 y.o.  Admit date: 11/03/2017 Discharge date: 11/09/2017  Admission Diagnoses: Patient Active Problem List   Diagnosis Date Noted  . Anxiety state 08/24/2016  . Tobacco abuse 09/27/2015  . Aortic stenosis due to bicuspid aortic valve 02/20/2014  . Bipolar disorder ([place]) 02/05/2014  . Bicuspid aortic valve 01/27/2013  . Migraine with status migrainosus 01/27/2013  . Hyperlipidemia with target LDL less than 130 01/27/2013  . Mild persistent asthma 04/21/2011  . 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 who returns to the office today for follow-up with hopes to proceed with elective aortic valve replacement in the near future. She was originally seen in consultation nearly 1 year ago on 10/12/2016. At that time she remained essentially asymptomatic and did not wish to proceed with elective surgical intervention. She states that over the past year she has developed significant decreased exercise tolerance with worsening fatigue and occasional episodes of substernal chest discomfort. She has also developed some mild exertional shortness of breath with more strenuous activity. She denies any resting shortness of breath or chest discomfort. She has not had dizzy spells or near syncope. Appetite is good. She continues  to smoke cigarettes.  Patient is single and lives locally in [place]. 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 She does not exercise on a regular basis. She has a long history of tobacco abuse and currently smokes one half pack of cigarettes daily. She takes several medications on a regular basis and has no significant contraindication to long-term anticoagulation.  Patient returns the office today for follow-up of bicuspid aortic valve with severe symptomatic aortic stenosis with tentative plans to proceed with minimally invasive aortic valve replacement later this week. She was originally seen in consultation on September 29, 2017. Since then she has remained clinically stable. She states that she cut back considerably on smoking but she has not been able to quit completely. She otherwise feels well and reports no new problems. She is looking forward to putting the surgery behind her.   Hospital Course:  Danielle Obrien underwent a minimally invasive aortic valve replacement on 11/03/2017 with Dr. Roxy Manns.  She tolerated the procedure well and was transferred to the ICU.  She was extubated in the time of manner.  Postop day 1 we discontinued lines as she was not requiring any vasopressors.  She was breathing comfortably on 3 L per of oxygen support.  She did have some expected postop acute blood loss anemia and some expected postop atelectasis on chest x-ray.  She was quite fluid overloaded therefore we initiated a diuretic regimen.  We restarted her medication for her chronic anxiety and bipolar disorder.  We started Coumadin for valve thrombus prophylaxis.  Postop day 2 she continued to progress.  We started to mobilize the patient.  Her chest tubes were discontinued.  We continued  to monitor her platelet count which was slightly down to 78k.  She was diuresing quite nicely and her weight was down.  We held off on initiating any further blood pressure medications at this  time.  She was stable to transfer to 4 E. for continued care. She continued to progress on the floor. Her pacing wires were removed on 11/06/2017 without issue. She continued to ambulate with limited assistance. She was tolerating room air. We increased her dose of coumadin for optimal INR for her mechanical valve. Today, she is tolerating room air, her incisions are healing well, her INR is therapeutic and she is ready for discharge.   Consults: None  Significant Diagnostic Studies:  CLINICAL DATA:  Atelectasis, CABG  EXAM: PORTABLE CHEST 1 VIEW  COMPARISON:  11/04/2017  FINDINGS: Interval removal of Swan-Ganz catheter and mediastinal drain. No pneumothorax. Cardiomegaly with vascular congestion. Left lower lobe atelectasis or infiltrate, stable.  IMPRESSION: Interval removal of Swan-Ganz catheter. Continued cardiomegaly and vascular congestion. Left lower lobe atelectasis or infiltrate.  No pneumothorax.   Electronically Signed   By: Rolm Baptise M.D.   On: 11/05/2017 08:30   Treatments:  CARDIOTHORACIC SURGERY OPERATIVE NOTE  Date of Procedure:                11/03/2017  Preoperative Diagnosis:      Severe Aortic Stenosis   Postoperative Diagnosis:    Same   Procedure:        Minimally Invasive Aortic Valve Replacement Right Anterior Mini-thoracotomy Sorin [employer] [place] bileaflet mechanical valve (size 52mm, catalog [id number], serial [id number]) Bovine pericardial patch enlargement of the aortic root Plating of right 3rd costal cartilage (KLS titanium plate)              Surgeon:        Valentina Gu. Roxy Manns, MD  Assistant:       Nicholes Rough, PA-C  Anesthesia:    Albertha Ghee, MD  Operative Findings: ? Sievers type 0 bicuspid aortic valve ? Severe aortic stenosis ? Normal LV systolic function   Discharge Exam: Blood pressure 117/74, pulse 96, temperature 99.1 F (37.3 C), temperature source Oral, resp. rate (!) 24, height 5' (1.524 m),  weight 116 lb 8 oz (52.8 kg), SpO2 91 %.   General appearance: alert, cooperative and no distress Heart: sinus tachycardia Lungs: clear to auscultation bilaterally Abdomen: soft, non-tender; bowel sounds normal; no masses,  no organomegaly Extremities: extremities normal, atraumatic, no cyanosis or edema Wound: clean and dry    Disposition: 01-Home or Self Care  Discharge Instructions    Amb Referral to Cardiac Rehabilitation   Complete by:  As directed    Diagnosis:  Valve Replacement   Valve:  Mitral     Allergies as of 11/09/2017      Reactions   Demerol Nausea And Vomiting   Ibuprofen Other (See Comments)   Gastritis   Morphine And Related Nausea And Vomiting   Codeine Itching      Medication List    STOP taking these medications   losartan 50 MG tablet Commonly known as:  COZAAR   traMADol 50 MG tablet Commonly known as:  ULTRAM     TAKE these medications   albuterol 108 (90 Base) MCG/ACT inhaler Commonly known as:  PROVENTIL HFA;VENTOLIN HFA Inhale 2 puffs into the lungs every 6 (six) hours as needed for wheezing or shortness of breath.   ALPRAZolam 0.5 MG tablet Commonly known as:  XANAX Take 1 tablet (0.5 mg  total) by mouth at bedtime as needed for anxiety.   aspirin 81 MG EC tablet Take 1 tablet (81 mg total) by mouth daily. Start taking on:  11/10/2017   busPIRone 30 MG tablet Commonly known as:  BUSPAR TAKE ONE TABLET BY MOUTH TWICE DAILY AT 10AM AND 5PM What changed:    how much to take  how to take this  when to take this  additional instructions   CALCIUM 600/VITAMIN D PO Take 1 tablet by mouth daily.   gabapentin 300 MG capsule Commonly known as:  NEURONTIN TAKE 2 CAPSULES BY MOUTH THREE TIMES A DAY What changed:  See the new instructions.   lamoTRIgine 200 MG tablet Commonly known as:  LAMICTAL TAKE 2 TABLETS (400 MG TOTAL) BY MOUTH DAILY. What changed:    how much to take  how to take this  when to take  this  additional instructions   metoprolol tartrate 25 MG tablet Commonly known as:  LOPRESSOR Take 0.5 tablets (12.5 mg total) by mouth 2 (two) times daily.   oxyCODONE 5 MG immediate release tablet Commonly known as:  Oxy IR/ROXICODONE Take 1 tablet (5 mg total) by mouth every 4 (four) hours as needed for severe pain.   PREVACID 15 MG capsule Generic drug:  lansoprazole Take 15 mg by mouth daily as needed (for heartburn or acid reflux).   VISINE OP Place 1 drop into both eyes at bedtime as needed (for allergies).   VITAMIN C PO Take 1 tablet by mouth daily.   warfarin 7.5 MG tablet Commonly known as:  COUMADIN Take 1 tablet (7.5 mg total) by mouth daily at 6 PM.      Follow-up Information    Nche, Charlene Brooke, NP. Call in 1 day(s).   Specialty:  Internal Medicine Contact information: [address] [address] [phone]        Rexene Alberts, MD Follow up.   Specialty:  Cardiothoracic Surgery Why:  Your appointment is on 11/15/2017 at 4:15pm. Please arrive at 3:45pm for a chest xray located at [facility] which is on the first floor of our building.  Contact information: [address] [place] [address] [phone]        Burtis Junes, NP Follow up.   Specialties:  Nurse Practitioner, Interventional Cardiology, Cardiology, Radiology Why:  Truitt Merle, NP 12/26 @ 11 am ([facility])  Contact information: [place]. [address] [place] [address] [phone]        [facility] Follow up.   Specialty:  Cardiology Why:  Please schedule a coumadin appointment for Thursday 11/11/2017 for an INR check.  Contact information: [address] [place] [phone]         The patient has been discharged on:   1.Beta Blocker:  Yes [  x ]                              No   [   ]                              If No, reason:  2.Ace  Inhibitor/ARB: Yes [   ]  No  [ x   ]                                     If No, reason:  3.Statin:   Yes [   ]                  No  [ x  ]                  If No, reason:  4.Ecasa:  Yes  [ x  ]                  No   [   ]                  If No, reason:     Signed: Elgie Collard 11/09/2017, 10:17 AM

## 2017-11-05 NOTE — Progress Notes (Signed)
[address]       [place] [address]             [phone]        CARDIOTHORACIC SURGERY PROGRESS NOTE   R2 Days Post-Op Procedure(s) (LRB): MINIMALLY INVASIVE AORTIC VALVE REPLACEMENT( MINI THORACOTOMY) (N/A) TRANSESOPHAGEAL ECHOCARDIOGRAM (TEE) (N/A)  Subjective: Looks good and feels well.  Mild soreness  Objective: Vital signs: BP Readings from Last 1 Encounters:  11/05/17 104/61   Pulse Readings from Last 1 Encounters:  11/05/17 89   Resp Readings from Last 1 Encounters:  11/05/17 20   Temp Readings from Last 1 Encounters:  11/05/17 98.1 F (36.7 C) (Oral)    Hemodynamics:    Physical Exam:  Rhythm:   sinus  Breath sounds: clear  Heart sounds:  RRR w/ mechanical sounds  Incisions:  Clean and dry  Abdomen:  Soft, non-distended, non-tender  Extremities:  Warm, well-perfused    Intake/Output from previous day: 12/06 0701 - 12/07 0700 In: 9147 [P.O.:1080; I.V.:460; IV Piggyback:150] Out: 3215 [Urine:3005; Chest Tube:210] Intake/Output this shift: Total I/O In: 50 [IV Piggyback:50] Out: 350 [Urine:350]  Lab Results:  CBC: Recent Labs    11/04/17 1709 11/04/17 1723 11/05/17 0427  WBC 8.2  --  5.6  HGB 11.4* 11.2* 9.9*  HCT 34.5* 33.0* 29.6*  PLT 93*  --  78*    BMET:  Recent Labs    11/04/17 0349  11/04/17 1723 11/05/17 0427  NA 132*  --  136 136  K 4.0  --  4.2 3.8  CL 102  --  98* 103  CO2 20*  --   --  27  GLUCOSE 115*  --  107* 108*  BUN 10  --  9 9  CREATININE 0.69   < > 0.70 0.80  CALCIUM 7.8*  --   --  8.0*   < > = values in this interval not displayed.     PT/INR:   Recent Labs    11/05/17 0427  LABPROT 14.8  INR 1.17    CBG (last 3)  Recent Labs    11/04/17 2347 11/05/17 0304 11/05/17 0755  GLUCAP 148* 107* 128*    ABG    Component Value Date/Time   PHART 7.381 11/04/2017 0445   PCO2ART 33.9 11/04/2017 0445   PO2ART 61.2 (L) 11/04/2017 0445   HCO3 19.7 (L) 11/04/2017 0445   TCO2 27 11/04/2017 1723   ACIDBASEDEF 4.5 (H) 11/04/2017 0445   O2SAT 90.8 11/04/2017 0445    CXR: PORTABLE CHEST 1 VIEW  COMPARISON:  11/04/2017  FINDINGS: Interval removal of Swan-Ganz catheter and mediastinal drain. No pneumothorax. Cardiomegaly with vascular congestion. Left lower lobe atelectasis or infiltrate, stable.  IMPRESSION: Interval removal of Swan-Ganz catheter. Continued cardiomegaly and vascular congestion. Left lower lobe atelectasis or infiltrate.  No pneumothorax.   Electronically Signed   By: Rolm Baptise M.D.   On: 11/05/2017 08:30   Assessment/Plan: S/P Procedure(s) (LRB): MINIMALLY INVASIVE AORTIC VALVE REPLACEMENT( MINI THORACOTOMY) (N/A) TRANSESOPHAGEAL ECHOCARDIOGRAM (TEE) (N/A)  Doing well POD2 Maintaining NSR w/ stable BP Breathing comfortably w/ O2 sats 98% on room air Expected post op acute blood loss anemia, mild Expected post op atelectasis, mild Expected post op volume excess, weight down 6 lbs but still reportedly 8 lbs > preop Post op thrombocytopenia, platelet count down slightly to 78k this morning Longstanding tobacco abuse Chronic anxiety, bipolar disorder   Mobilize  Diuresis  Pulm toilet  Coumadin  D/C  pacing wires tomorrow if rhythm stable  Watch platelet count  Continue to hold ARB and consider resuming prior to D/C if BP increases  Transfer 4E   Rexene Alberts, MD 11/05/2017 9:34 AM

## 2017-11-05 NOTE — Progress Notes (Signed)
Pt transferred from [facility] to 4E24.  Dressing where chest tube had been removed yesterday was saturated with serous drainage.  2H RN had mentioned this in report and Dr. Cyndia Bent was notified prior to transfer.  Advised to continue to monitor and change dressing as needed.

## 2017-11-05 NOTE — Anesthesia Postprocedure Evaluation (Signed)
Anesthesia Post Note  Patient: Danielle Obrien  Procedure(s) Performed: MINIMALLY INVASIVE AORTIC VALVE REPLACEMENT( MINI THORACOTOMY) (N/A Chest) TRANSESOPHAGEAL ECHOCARDIOGRAM (TEE) (N/A )     Patient location during evaluation: SICU Anesthesia Type: General Level of consciousness: sedated Pain management: pain level controlled Vital Signs Assessment: post-procedure vital signs reviewed and stable Respiratory status: patient remains intubated per anesthesia plan Cardiovascular status: stable Postop Assessment: no apparent nausea or vomiting Anesthetic complications: no    Last Vitals:  Vitals:   11/05/17 0900 11/05/17 1000  BP: (!) 102/56 113/70  Pulse: 86 88  Resp: (!) 22 (!) 22  Temp:    SpO2: 99% 100%    Last Pain:  Vitals:   11/05/17 1000  TempSrc:   PainSc: [place]

## 2017-11-06 ENCOUNTER — Inpatient Hospital Stay (HOSPITAL_COMMUNITY): Payer: Managed Care, Other (non HMO)

## 2017-11-06 LAB — BASIC METABOLIC PANEL
Anion gap: 7 (ref 5–15)
BUN: 11 mg/dL (ref 6–20)
CHLORIDE: 101 mmol/L (ref 101–111)
CO2: 27 mmol/L (ref 22–32)
CREATININE: 0.8 mg/dL (ref 0.44–1.00)
Calcium: 8.3 mg/dL — ABNORMAL LOW (ref 8.9–10.3)
GFR calc non Af Amer: >60 mL/min (ref >60)
Glucose, Bld: 120 mg/dL — ABNORMAL HIGH (ref 65–99)
Potassium: 4.3 mmol/L (ref 3.5–5.1)
Sodium: 135 mmol/L (ref 135–145)

## 2017-11-06 LAB — CBC
HEMATOCRIT: 34.1 % — AB (ref 36.0–46.0)
HEMOGLOBIN: 11.5 g/dL — AB (ref 12.0–15.0)
MCH: 31.3 pg (ref 26.0–34.0)
MCHC: 33.7 g/dL (ref 30.0–36.0)
MCV: 92.7 fL (ref 78.0–100.0)
Platelets: 96 10*3/uL — ABNORMAL LOW (ref 150–400)
RBC: 3.68 MIL/uL — ABNORMAL LOW (ref 3.87–5.11)
RDW: 13.4 % (ref 11.5–15.5)
WBC: 5.7 10*3/uL (ref 4.0–10.5)

## 2017-11-06 LAB — PROTIME-INR
INR: 1.14
Prothrombin Time: 14.5 seconds (ref 11.4–15.2)

## 2017-11-06 NOTE — Progress Notes (Signed)
dc'ed pacing wires pt. tolerated well 

## 2017-11-06 NOTE — Progress Notes (Addendum)
      [address]       [place] [address]             [phone]      3 Days Post-Op Procedure(s) (LRB): MINIMALLY INVASIVE AORTIC VALVE REPLACEMENT( MINI THORACOTOMY) (N/A) TRANSESOPHAGEAL ECHOCARDIOGRAM (TEE) (N/A) Subjective: Still having a lot of right sided chest pain.   Objective: Vital signs in last 24 hours: Temp:  [98.2 F (36.8 C)-99.5 F (37.5 C)] 98.2 F (36.8 C) (12/08 0752) Pulse Rate:  [87-97] 87 (12/08 0752) Cardiac Rhythm: Normal sinus rhythm (12/08 0701) Resp:  [15-28] 20 (12/08 0752) BP: (101-116)/(54-76) 114/70 (12/08 0752) SpO2:  [92 %-100 %] 93 % (12/08 0752) Weight:  [124 lb 3.2 oz (56.3 kg)] 124 lb 3.2 oz (56.3 kg) (12/08 0500)     Intake/Output from previous day: 12/07 0701 - 12/08 0700 In: 1180 [P.O.:1080; IV Piggyback:100] Out: 1300 [Urine:1300] Intake/Output this shift: No intake/output data recorded.  General appearance: alert, cooperative and no distress Heart: regular rate and rhythm, S1, S2 normal, no murmur, click, rub or gallop Lungs: clear to auscultation bilaterally Abdomen: soft, non-tender; bowel sounds normal; no masses,  no organomegaly Extremities: extremities normal, atraumatic, no cyanosis or edema Wound: clean and dry  Lab Results: Recent Labs    11/05/17 0427 11/06/17 0219  WBC 5.6 5.7  HGB 9.9* 11.5*  HCT 29.6* 34.1*  PLT 78* 96*   BMET:  Recent Labs    11/05/17 0427 11/06/17 0219  NA 136 135  K 3.8 4.3  CL 103 101  CO2 27 27  GLUCOSE 108* 120*  BUN 9 11  CREATININE 0.80 0.80  CALCIUM 8.0* 8.3*    PT/INR:  Recent Labs    11/06/17 0219  LABPROT 14.5  INR 1.14   ABG    Component Value Date/Time   PHART 7.381 11/04/2017 0445   HCO3 19.7 (L) 11/04/2017 0445   TCO2 27 11/04/2017 1723   ACIDBASEDEF 4.5 (H) 11/04/2017 0445   O2SAT 90.8 11/04/2017 0445   CBG (last 3)  Recent Labs    11/05/17 0755 11/05/17 1159 11/05/17 1600  GLUCAP 128* 89 131*    Assessment/Plan: S/P  Procedure(s) (LRB): MINIMALLY INVASIVE AORTIC VALVE REPLACEMENT( MINI THORACOTOMY) (N/A) TRANSESOPHAGEAL ECHOCARDIOGRAM (TEE) (N/A)  1. CV-NSR in the 80s. BP stable. Tolerating low-dose BB. Continue Coumadin-INR is 1.14.  2. Pulm-Chest tubes have been removed, CXR appears stable compared to her previous study. No pneumo.  3. Renal-weight remains stable. Creatinine 0.80. Electrolytes okay.  4. H and H trending up. 11.5/34.1 today. Thrombocytopenia however trending up 96k today.  5. Endo-blood glucose level well controlled. A1C 5.4. Will discontinue monitoring.   Plan: Discontinue EPW. Work on pain control. Work on ambulation with assistance. Encourage incentive spirometer use. Continue coumadin for valve thrombus prophylaxis.     LOS: 3 days    Elgie Collard 11/06/2017   Chart reviewed, patient examined, agree with above. She is progressing well overall. Will have to wait until INR therapeutic before discharge with a mechanical valve.

## 2017-11-06 NOTE — Progress Notes (Signed)
CARDIAC REHAB PHASE I   PRE:  Rate/Rhythm: 85 sinus rhythm  BP:  Supine:   Sitting: 113/72  Standing:    SaO2: 94% ra  MODE:  Ambulation: 470  ft   POST:  Rate/Rhythem: 95 sinus rhythm  BP:  Supine:   Sitting: 125/82  Standing:    SaO2: 96% ra  0955-1050  Pt ambulated in hallway, slow steady gait.  Asymptomatic.  Pt returned to chair with call light in reach.  Education completed including sternal precautions, heart healthy diet, vitamin K diet, exercise, and medication compliance.  Pt oriented to outpatient cardiac rehab.  At pt request, referral will be sent to [facility].  Understanding verbalized  Carolyne Littles, RN, BSN  11/06/17  10:46 AM

## 2017-11-07 LAB — PROTIME-INR
INR: 1.14
Prothrombin Time: 14.5 seconds (ref 11.4–15.2)

## 2017-11-07 MED ORDER — WARFARIN SODIUM 7.5 MG PO TABS
7.5000 mg | ORAL_TABLET | Freq: Every day | ORAL | Status: DC
  Administered 2017-11-07: 7.5 mg via ORAL
  Filled 2017-11-07: qty 1

## 2017-11-07 MED ORDER — LAMOTRIGINE 100 MG PO TABS
200.0000 mg | ORAL_TABLET | Freq: Two times a day (BID) | ORAL | Status: DC
  Administered 2017-11-07 – 2017-11-09 (×5): 200 mg via ORAL
  Filled 2017-11-07 (×3): qty 2
  Filled 2017-11-07: qty 8
  Filled 2017-11-07 (×2): qty 2

## 2017-11-07 NOTE — Progress Notes (Addendum)
      [address]       ,[place] [address]             [phone]      4 Days Post-Op Procedure(s) (LRB): MINIMALLY INVASIVE AORTIC VALVE REPLACEMENT( MINI THORACOTOMY) (N/A) TRANSESOPHAGEAL ECHOCARDIOGRAM (TEE) (N/A) Subjective: Having some drainage from her chest tube sites. Asking if she can start taking her coumadin in the mornings because when she is home it works better with her schedule.    Objective: Vital signs in last 24 hours: Temp:  [98 F (36.7 C)-98.4 F (36.9 C)] 98 F (36.7 C) (12/09 0405) Pulse Rate:  [79-87] 85 (12/09 0145) Cardiac Rhythm: Normal sinus rhythm (12/09 0405) Resp:  [10-19] 17 (12/09 0700) BP: (103-119)/(68-89) 111/77 (12/09 0405) SpO2:  [94 %-100 %] 96 % (12/09 0405) Weight:  [124 lb (56.2 kg)] 124 lb (56.2 kg) (12/09 0405)     Intake/Output from previous day: 12/08 0701 - 12/09 0700 In: 600 [P.O.:600] Out: -  Intake/Output this shift: No intake/output data recorded.  General appearance: alert, cooperative and no distress Heart: regular rate and rhythm, S1, S2 normal, no murmur, click, rub or gallop Lungs: clear to auscultation bilaterally Abdomen: soft, non-tender; bowel sounds normal; no masses,  no organomegaly Extremities: extremities normal, atraumatic, no cyanosis or edema Wound: clean and dry  Lab Results: Recent Labs    11/05/17 0427 11/06/17 0219  WBC 5.6 5.7  HGB 9.9* 11.5*  HCT 29.6* 34.1*  PLT 78* 96*   BMET:  Recent Labs    11/05/17 0427 11/06/17 0219  NA 136 135  K 3.8 4.3  CL 103 101  CO2 27 27  GLUCOSE 108* 120*  BUN 9 11  CREATININE 0.80 0.80  CALCIUM 8.0* 8.3*    PT/INR:  Recent Labs    11/07/17 0344  LABPROT 14.5  INR 1.14   ABG    Component Value Date/Time   PHART 7.381 11/04/2017 0445   HCO3 19.7 (L) 11/04/2017 0445   TCO2 27 11/04/2017 1723   ACIDBASEDEF 4.5 (H) 11/04/2017 0445   O2SAT 90.8 11/04/2017 0445   CBG (last 3)  Recent Labs    11/05/17 0755  11/05/17 1159 11/05/17 1600  GLUCAP 128* 89 131*    Assessment/Plan: S/P Procedure(s) (LRB): MINIMALLY INVASIVE AORTIC VALVE REPLACEMENT( MINI THORACOTOMY) (N/A) TRANSESOPHAGEAL ECHOCARDIOGRAM (TEE) (N/A)  1. CV-NSR in the 80s. BP stable. Tolerating low-dose BB. Continue Coumadin 5mg , INR remains 1.14. Will increase to 7.5mg .  2. Pulm-Chest tubes have been removed, CXR appears stable compared to her previous study. No pneumo.  3. Renal-weight remains stable. Creatinine 0.80. Electrolytes okay.  4. H and H trending up. 11.5/34.1 yesterday. Thrombocytopenia however trending up 96k.  5. Endo-blood glucose level well controlled. A1C 5.4. Will discontinue monitoring.  6. Chronic anxiety and bipolar disorder-on home medications.  7. Chest tube site drainage-clear to yellow tinged serosanguinous drainage on dressing this morning. I changed the dressing site is clean without erythema.   Plan: increase coumadin dose to 7.5mg . Ambulate in the halls today. Encouraged incentive spirometer.    LOS: 4 days    Elgie Collard 11/07/2017   Chart reviewed, patient examined, agree with above. INR has not bumped yet. Coumadin dose increased. Otherwise doing well.

## 2017-11-08 LAB — PROTIME-INR
INR: 1.65
PROTHROMBIN TIME: 19.4 s — AB (ref 11.4–15.2)

## 2017-11-08 MED ORDER — WARFARIN SODIUM 5 MG PO TABS
5.0000 mg | ORAL_TABLET | Freq: Every day | ORAL | Status: DC
  Administered 2017-11-08: 5 mg via ORAL
  Filled 2017-11-08: qty 1

## 2017-11-08 NOTE — Progress Notes (Addendum)
      [address]       [place] [address]             [phone]      5 Days Post-Op Procedure(s) (LRB): MINIMALLY INVASIVE AORTIC VALVE REPLACEMENT( MINI THORACOTOMY) (N/A) TRANSESOPHAGEAL ECHOCARDIOGRAM (TEE) (N/A) Subjective: Feels okay this morning. Still having some soreness over his incision.   Objective: Vital signs in last 24 hours: Temp:  [98.4 F (36.9 C)-98.6 F (37 C)] 98.4 F (36.9 C) (12/10 0500) Pulse Rate:  [84-94] 84 (12/10 0500) Cardiac Rhythm: Normal sinus rhythm (12/10 0200) Resp:  [14-20] 14 (12/10 0500) BP: (101-119)/(64-90) 101/64 (12/10 0500) SpO2:  [92 %-98 %] 95 % (12/10 0500) Weight:  [120 lb 1.6 oz (54.5 kg)] 120 lb 1.6 oz (54.5 kg) (12/10 0400)     Intake/Output from previous day: No intake/output data recorded. Intake/Output this shift: No intake/output data recorded.  General appearance: alert, cooperative and no distress Heart: regular rate and rhythm, S1, S2 normal, no murmur, click, rub or gallop Lungs: clear to auscultation bilaterally Abdomen: soft, non-tender; bowel sounds normal; no masses,  no organomegaly Extremities: extremities normal, atraumatic, no cyanosis or edema Wound: clean and dry  Lab Results: Recent Labs    11/06/17 0219  WBC 5.7  HGB 11.5*  HCT 34.1*  PLT 96*   BMET:  Recent Labs    11/06/17 0219  NA 135  K 4.3  CL 101  CO2 27  GLUCOSE 120*  BUN 11  CREATININE 0.80  CALCIUM 8.3*    PT/INR:  Recent Labs    11/08/17 0240  LABPROT 19.4*  INR 1.65   ABG    Component Value Date/Time   PHART 7.381 11/04/2017 0445   HCO3 19.7 (L) 11/04/2017 0445   TCO2 27 11/04/2017 1723   ACIDBASEDEF 4.5 (H) 11/04/2017 0445   O2SAT 90.8 11/04/2017 0445   CBG (last 3)  Recent Labs    11/05/17 0755 11/05/17 1159 11/05/17 1600  GLUCAP 128* 89 131*    Assessment/Plan: S/P Procedure(s) (LRB): MINIMALLY INVASIVE AORTIC VALVE REPLACEMENT( MINI THORACOTOMY) (N/A) TRANSESOPHAGEAL  ECHOCARDIOGRAM (TEE) (N/A)  1. CV-NSR in the 80s. BP stable. Tolerating low-dose BB. Continue Coumadin 5mg , INR is 1.65. Continue Coumadin 7.5mg .  2. Pulm-Chest tubes have been removed, CXR appears stable compared to her previous study. No pneumo.  3. Renal-weight remains stable. Creatinine 0.80. Electrolytes okay.  4. H and H trending up. 11.5/34.1 yesterday. Thrombocytopenia however trending up 96k.  5. Endo-blood glucose level well controlled. A1C 5.4. Not a diabetic.  6. Chronic anxiety and bipolar disorder-on home medications.  7. Chest tube site drainage-improving  Plan: Continue to let her INR climb to therapeutic. Probably home tomorrow.     LOS: 5 days    Elgie Collard 11/08/2017  I have seen and examined the patient and agree with the assessment and plan as outlined.  Tentatively plan d/c home tomorrow.  Continue low dose ASA as outpatient until stable therapeutic on warfarin.  Rexene Alberts, MD 11/08/2017 10:07 AM

## 2017-11-09 LAB — PROTIME-INR
INR: 1.66
Prothrombin Time: 19.5 seconds — ABNORMAL HIGH (ref 11.4–15.2)

## 2017-11-09 MED ORDER — ASPIRIN 81 MG PO TBEC: 81.0000 mg | DELAYED_RELEASE_TABLET | Freq: Every day | ORAL | 1 refills | Status: DC

## 2017-11-09 MED ORDER — WARFARIN SODIUM 7.5 MG PO TABS: 7.5000 mg | ORAL_TABLET | Freq: Every day | ORAL | Status: DC

## 2017-11-09 MED ORDER — WARFARIN SODIUM 7.5 MG PO TABS: 7.5000 mg | ORAL_TABLET | Freq: Every day | ORAL | 1 refills | Status: DC

## 2017-11-09 MED ORDER — ALPRAZOLAM 0.5 MG PO TABS: 0.5000 mg | ORAL_TABLET | Freq: Every evening | ORAL | 0 refills | Status: DC | PRN

## 2017-11-09 MED ORDER — METOPROLOL TARTRATE 25 MG PO TABS: 12.5000 mg | ORAL_TABLET | Freq: Two times a day (BID) | ORAL | 1 refills | Status: DC

## 2017-11-09 MED ORDER — OXYCODONE HCL 5 MG PO TABS: 5.0000 mg | ORAL_TABLET | ORAL | 0 refills | Status: DC | PRN

## 2017-11-09 NOTE — Progress Notes (Signed)
[id number] Came to see pt to walk. Pt has already walked 470 ft independently. She hopes to go home still even though INR not therapeutic. Tearful. Emotional support given. Pt has been educated and is walking independently so we will sign off. Encouraged pt to walk farther today which she agreed. Referring to [place] Phase 2. Graylon Good RN BSN 11/09/2017 9:09 AM

## 2017-11-09 NOTE — Progress Notes (Signed)
      [address]       Danielle Obrien,Danielle Ob[place] [address]             [phone]      6 Days Post-Op Procedure(s) (LRB): MINIMALLY INVASIVE AORTIC VALVE REPLACEMENT( MINI THORACOTOMY) (N/A) TRANSESOPHAGEAL ECHOCARDIOGRAM (TEE) (N/A) Subjective: Was unhappy to hear the news that her INR was about the same as yesterday. She was looking forward to going home today.  Objective: Vital signs in last 24 hours: Temp:  [98 F (36.7 C)-99.1 F (37.3 C)] 99.1 F (37.3 C) (12/11 0413) Pulse Rate:  [78-92] 87 (12/11 0413) Cardiac Rhythm: Normal sinus rhythm (12/10 2101) Resp:  [15-24] 24 (12/11 0413) BP: (101-108)/(59-71) 103/71 (12/11 0413) SpO2:  [91 %-96 %] 91 % (12/11 0413) Weight:  [116 lb 8 oz (52.8 kg)] 116 lb 8 oz (52.8 kg) (12/11 0413)     Intake/Output from previous day: No intake/output data recorded. Intake/Output this shift: No intake/output data recorded.  General appearance: alert, cooperative and no distress Heart: sinus tachycardia Lungs: clear to auscultation bilaterally Abdomen: soft, non-tender; bowel sounds normal; no masses,  no organomegaly Extremities: extremities normal, atraumatic, no cyanosis or edema Wound: clean and dry  Lab Results: No results for input(s): WBC, HGB, HCT, PLT in the last 72 hours. BMET: No results for input(s): NA, K, CL, CO2, GLUCOSE, BUN, CREATININE, CALCIUM in the last 72 hours.  PT/INR:  Recent Labs    11/09/17 0204  LABPROT 19.5*  INR 1.66   ABG    Component Value Date/Time   PHART 7.381 11/04/2017 0445   HCO3 19.7 (L) 11/04/2017 0445   TCO2 27 11/04/2017 1723   ACIDBASEDEF 4.5 (H) 11/04/2017 0445   O2SAT 90.8 11/04/2017 0445   CBG (last 3)  No results for input(s): GLUCAP in the last 72 hours.  Assessment/Plan: S/P Procedure(s) (LRB): MINIMALLY INVASIVE AORTIC VALVE REPLACEMENT( MINI THORACOTOMY) (N/A) TRANSESOPHAGEAL ECHOCARDIOGRAM (TEE) (N/A)  1. CV-NSR in the 80s. BP stable. Tolerating low-dose BB. INR  is 1.66. Continue Coumadin 7.5mg .May need to increase to 10mg  2. Pulm-Chest tubes have been removed, CXR appears stable compared to her previous study. No pneumo.  3. Renal-weight down to 116. Creatinine 0.80. Electrolytes okay.  4. H and H trending up. 11.5/34.1yesterday. Thrombocytopenia however trending up 96k.  5. Endo-blood glucose level well controlled. A1C 5.4. Not a diabetic.  6. Chronic anxiety and bipolar disorder-on home medications. 7. Chest tube site drainage-improving  Plan: Continue coumadin. Will discharge once INR is therapeutic.      LOS: 6 days    Danielle Obrien 11/09/2017

## 2017-11-09 NOTE — Progress Notes (Addendum)
Patient and family given  And reviewed discharge instructions medication list and paper prescriptions. Follow up appointments given and patient verbalized understanding. All done with Sign language/Hearing impaired interpreter at bedside. All questions were answered. IV and tele dcd. Will discharge home with Parents, taken to exit in wheelchair with hospital volunteer. Aman Bonet, Bettina Gavia rN

## 2017-11-09 NOTE — Care Management Note (Signed)
Case Management Note Original Note Created Zenon Mayo, RN 11/04/2017, 3:44 PM  Patient Details  Name: Danielle Obrien MRN: [id number] Date of Birth: 20-Jul-1981  Subjective/Objective:   From home with Mom and Dad who will be with her 24/7, POD 1 AVR, mini thorcotomy, has chest tube , cont to mobilize and diurese.   She has a PCP and medication coverage.                   Action/Plan: NCM will follow for dc needs.   Expected Discharge Date:  11/09/17               Expected Discharge Plan:  Home/Self Care  In-House Referral:  Interpreting Services  Discharge planning Services  CM Consult  Post Acute Care Choice:    Choice offered to:     DME Arranged:    DME Agency:     HH Arranged:    HH Agency:     Status of Service:  Completed, signed off  If discussed at [employer] of Stay Meetings, dates discussed:    Discharge Disposition: home/self care   Additional Comments:  11/09/17- [facility] RN, CM- pt discharged home today- bedside RN worked with pt for deaf interpreter needs for parents to review d/c instructions at the bedside- No CM needs noted for transition to home with parents.   Danielle Client Gibson, RN 11/09/2017, 3:56 PM [phone]

## 2017-11-09 NOTE — Progress Notes (Signed)
Per RN shift report and patient report. Patient had ambulated this AM in hallway with nursing staff. Brently Voorhis, Bettina Gavia rN

## 2017-11-10 ENCOUNTER — Telehealth: Payer: Self-pay | Admitting: Cardiology

## 2017-11-10 NOTE — Telephone Encounter (Signed)
no answer when called to give appt for coumadin

## 2017-11-10 NOTE — Telephone Encounter (Signed)
Pt aware of coumadin clinic appt tomorrow [address] office.

## 2017-11-11 ENCOUNTER — Telehealth: Payer: Self-pay | Admitting: Behavioral Health

## 2017-11-11 ENCOUNTER — Ambulatory Visit (INDEPENDENT_AMBULATORY_CARE_PROVIDER_SITE_OTHER): Payer: Managed Care, Other (non HMO)

## 2017-11-11 DIAGNOSIS — Z7901 Long term (current) use of anticoagulants: Secondary | ICD-10-CM

## 2017-11-11 DIAGNOSIS — Q231 Congenital insufficiency of aortic valve: Secondary | ICD-10-CM

## 2017-11-11 DIAGNOSIS — Q230 Congenital stenosis of aortic valve: Secondary | ICD-10-CM

## 2017-11-11 DIAGNOSIS — Z954 Presence of other heart-valve replacement: Secondary | ICD-10-CM | POA: Diagnosis not present

## 2017-11-11 LAB — POCT INR: INR: 4.8

## 2017-11-11 NOTE — Patient Instructions (Signed)
Skip Friday and Saturday's dosage of Coumadin, then start taking 1/2 tablet daily except 1 tablet on Mondays, Wednesdays and Fridays.

## 2017-11-11 NOTE — Telephone Encounter (Signed)
Patient declines TCM/Hospital follow-up at this time. She voiced that today she was seen at the Coumadin clinic; also she has upcoming appointments with both the cardiologist & surgeon. Patient stated, "that she will be establishing care with Dr. Ronnald Ramp at [facility] this information has already been discussed with Wilfred Lacy, NP, her current provider". Message routed to PCP for review.

## 2017-11-12 ENCOUNTER — Other Ambulatory Visit: Payer: Self-pay | Admitting: Thoracic Surgery (Cardiothoracic Vascular Surgery)

## 2017-11-12 ENCOUNTER — Telehealth: Payer: Self-pay

## 2017-11-12 DIAGNOSIS — Q231 Congenital insufficiency of aortic valve: Principal | ICD-10-CM

## 2017-11-12 DIAGNOSIS — Q230 Congenital stenosis of aortic valve: Secondary | ICD-10-CM

## 2017-11-12 NOTE — Telephone Encounter (Signed)
Patient called this morning expressing concern that she may have lifted too much the previous day after being discharged on Tuesday with an aortic valve replacement.  She stated that she was sore and wanted to make sure this was appropriate.  Patient was told that she is going to need to slow down and not do as much as she is used to because she needs time for her body to heal.  Patient also told she is to not lift anything over 10lbs until otherwise told to prevent damage to incisions.  Patient stated that her incisions looked good.  Patient was told to take her pain medication if needed and to call us back if she had any more questions.  Patient acknowledged receipt.

## 2017-11-15 ENCOUNTER — Ambulatory Visit (INDEPENDENT_AMBULATORY_CARE_PROVIDER_SITE_OTHER): Payer: Self-pay | Admitting: Thoracic Surgery (Cardiothoracic Vascular Surgery)

## 2017-11-15 ENCOUNTER — Other Ambulatory Visit: Payer: Self-pay

## 2017-11-15 ENCOUNTER — Encounter: Payer: Self-pay | Admitting: Thoracic Surgery (Cardiothoracic Vascular Surgery)

## 2017-11-15 ENCOUNTER — Ambulatory Visit
Admission: RE | Admit: 2017-11-15 | Discharge: 2017-11-15 | Disposition: A | Discharge status: Home / Self Care | Payer: Managed Care, Other (non HMO) | Source: Ambulatory Visit | Attending: Thoracic Surgery (Cardiothoracic Vascular Surgery) | Admitting: Thoracic Surgery (Cardiothoracic Vascular Surgery)

## 2017-11-15 VITALS — BP 116/83 | HR 93 | Ht 60.0 in | Wt 111.0 lb

## 2017-11-15 DIAGNOSIS — Z954 Presence of other heart-valve replacement: Secondary | ICD-10-CM

## 2017-11-15 DIAGNOSIS — Q230 Congenital stenosis of aortic valve: Secondary | ICD-10-CM

## 2017-11-15 DIAGNOSIS — Q231 Congenital insufficiency of aortic valve: Principal | ICD-10-CM

## 2017-11-15 DIAGNOSIS — Z736 Limitation of activities due to disability: Secondary | ICD-10-CM

## 2017-11-15 MED ORDER — OXYCODONE HCL 5 MG PO TABS: 5.0000 mg | ORAL_TABLET | ORAL | 0 refills | Status: DC | PRN

## 2017-11-15 NOTE — Progress Notes (Signed)
[address]       [place] [address]             [phone]     CARDIOTHORACIC SURGERY OFFICE NOTE  Referring Provider is Sueanne Margarita, MD PCP is Biagio Borg, MD   HPI:  Patient is a 36 year old female who returns the office today for routine follow-up status post minimally invasive aortic valve replacement using mechanical prosthetic valve on November 03, 2017 for bicuspid aortic valve disease with severe symptomatic aortic stenosis.  Her postoperative convalescence has been uneventful and she was discharged home on the sixth postoperative day.  Since hospital discharge she has continued to make excellent progress.  Her INR was 4.8 when it was last checked on November 11, 2017.  Her Coumadin was held and dose reduced at that time.  It is scheduled to be rechecked later this week.  She returns our office and reports that she is overall doing well.  She still has soreness in her chest but this continues to improve.  She gets short of breath with activity but this has improved.  She has not been smoking cigarettes.  She is sleeping well at night.  She denies any fevers, chills, or productive cough   Current Outpatient Medications  Medication Sig Dispense Refill  . albuterol (PROVENTIL HFA;VENTOLIN HFA) 108 (90 Base) MCG/ACT inhaler Inhale 2 puffs into the lungs every 6 (six) hours as needed for wheezing or shortness of breath. 1 Inhaler 0  . ALPRAZolam (XANAX) 0.5 MG tablet Take 1 tablet (0.5 mg total) by mouth at bedtime as needed for anxiety. 15 tablet 0  . Ascorbic Acid (VITAMIN C PO) Take 1 tablet by mouth daily.    Marland Kitchen aspirin 81 MG EC tablet Take 1 tablet (81 mg total) by mouth daily. 30 tablet 1  . busPIRone (BUSPAR) 30 MG tablet TAKE ONE TABLET BY MOUTH TWICE DAILY AT 10AM AND 5PM (Patient taking differently: Take 30 mg by mouth See admin instructions. Take 30 mg by mouth twice daily at 1000 and 1700) 60 tablet 3  . Calcium Carbonate-Vitamin D (CALCIUM  600/VITAMIN D PO) Take 1 tablet by mouth daily.    Marland Kitchen gabapentin (NEURONTIN) 300 MG capsule TAKE 2 CAPSULES BY MOUTH THREE TIMES A DAY (Patient taking differently: Take 600 mg by mouth 3 times daily) 360 capsule 0  . lamoTRIgine (LAMICTAL) 200 MG tablet TAKE 2 TABLETS (400 MG TOTAL) BY MOUTH DAILY. (Patient taking differently: Take 400 mg by mouth daily. ) 180 tablet 2  . lansoprazole (PREVACID) 15 MG capsule Take 15 mg by mouth daily as needed (for heartburn or acid reflux).     . metoprolol tartrate (LOPRESSOR) 25 MG tablet Take 0.5 tablets (12.5 mg total) by mouth 2 (two) times daily. 60 tablet 1  . Tetrahydrozoline HCl (VISINE OP) Place 1 drop into both eyes at bedtime as needed (for allergies).    . warfarin (COUMADIN) 7.5 MG tablet Take 1 tablet (7.5 mg total) by mouth daily at 6 PM. 30 tablet 1  . oxyCODONE (OXY IR/ROXICODONE) 5 MG immediate release tablet Take 1 tablet (5 mg total) by mouth every 4 (four) hours as needed for severe pain. (Patient not taking: Reported on 11/15/2017) 30 tablet 0  . traMADol (ULTRAM) 50 MG tablet      No current facility-administered medications for this visit.       Physical Exam:   BP 116/83 (BP Location: Left Arm, Patient Position: Sitting,  Cuff Size: Normal)   Pulse 93   Ht 5' (1.524 m)   Wt 111 lb (50.3 kg)   SpO2 98%   BMI 21.68 kg/m   General:  Well-appearing  Chest:   Clear to auscultation  CV:   Regular rate and rhythm with mechanical heart sounds  Incisions:  Healing nicely  Abdomen:  Soft nontender  Extremities:  Warm and well perfused  Diagnostic Tests:  CHEST  2 VIEW  COMPARISON:  11/06/2017  FINDINGS: Aortic valve replacement in satisfactory position. Heart size and vascularity normal. No pleural effusion.  Interval development of mild patchy airspace disease in the right upper lobe  IMPRESSION: Interval development of mild right upper lobe infiltrate.  Negative for heart failure.  Resolution of small pleural  effusions.   Electronically Signed   By: Franchot Gallo M.D.   On: 11/15/2017 16:14   Impression:  Patient is doing well less than 2 weeks status post minimally invasive aortic valve replacement using mechanical prosthetic valve   Plan:  We have not recommended any change the patient's current medications.  Once she is consistently therapeutic on warfarin it would be reasonable to stop aspirin.  I have encouraged the patient to continue to gradually increase her physical activity as tolerated but to avoid any sort of heavy lifting or strenuous use of her arms or shoulders for at least another 4-6 weeks.  Once she is no longer requiring oral narcotic pain relievers she may resume driving an automobile.  I have encouraged her to enroll and participate in outpatient cardiac rehab program.  All of her questions have been addressed.  The patient will return to our office in 8 weeks for routine follow-up.    Valentina Gu. Roxy Manns, MD 11/15/2017 4:27 PM

## 2017-11-15 NOTE — Patient Instructions (Addendum)
You may continue to gradually increase your physical activity as tolerated.  Refrain from any heavy lifting or strenuous use of your arms and shoulders until at least 8 weeks from the time of your surgery, and avoid activities that cause increased pain in your chest on the side of your surgical incision.  Otherwise you may continue to increase activities without any particular limitations.  Increase the intensity and duration of physical activity gradually.  You may return to driving an automobile as long as you are no longer requiring oral narcotic pain relievers during the daytime.  It would be wise to start driving only short distances during the daylight and gradually increase from there as you feel comfortable.  You are encouraged to enroll and participate in the outpatient cardiac rehab program beginning as soon as practical.  Continue all previous medications without any changes at this time

## 2017-11-17 ENCOUNTER — Telehealth: Payer: Self-pay | Admitting: Internal Medicine

## 2017-11-17 ENCOUNTER — Telehealth (HOSPITAL_COMMUNITY): Payer: Self-pay

## 2017-11-17 MED ORDER — TRAMADOL HCL 50 MG PO TABS: 50.0000 mg | ORAL_TABLET | Freq: Two times a day (BID) | ORAL | 0 refills | Status: DC | PRN

## 2017-11-17 NOTE — Telephone Encounter (Signed)
Patients insurance is active and benefits verified through [employer] - No co-pay, deductible amount of $1,200/$1,200 has been met, out of pocket amount of $3,000/$3,000 has been met, 20% co-insurance, and pre-authorization & Pre-certification is required. Passport/reference [phone]  Patient will be contacted and scheduled.

## 2017-11-17 NOTE — Telephone Encounter (Signed)
Done

## 2017-11-17 NOTE — Telephone Encounter (Signed)
Copied from [place] [id number]. Topic: Quick Communication - Rx Refill/Question >> Nov 17, 2017  8:33 AM Robina Ade, Helene Kelp D wrote: Has the patient contacted their pharmacy? Yes (Agent: If no, request that the patient contact the pharmacy for the refill.) Preferred Pharmacy (with phone number or street name): [facility] [address] [place] [address] Agent: Please be advised that RX refills may take up to 3 business days. We ask that you follow-up with your pharmacy. Patient needs refill on her traMADol (ULTRAM) 50 MG tablet.

## 2017-11-17 NOTE — Telephone Encounter (Signed)
Pt of Dr Hulan Saas; request tramadol refill; per MD's note in November 2017, pt to follow up in 4-6 weeks; does she need office visit

## 2017-11-18 ENCOUNTER — Telehealth: Payer: Self-pay

## 2017-11-18 ENCOUNTER — Ambulatory Visit (INDEPENDENT_AMBULATORY_CARE_PROVIDER_SITE_OTHER): Payer: Managed Care, Other (non HMO) | Admitting: *Deleted

## 2017-11-18 ENCOUNTER — Telehealth (HOSPITAL_COMMUNITY): Payer: Self-pay | Admitting: Internal Medicine

## 2017-11-18 ENCOUNTER — Telehealth (HOSPITAL_COMMUNITY): Payer: Self-pay

## 2017-11-18 DIAGNOSIS — Q230 Congenital stenosis of aortic valve: Secondary | ICD-10-CM

## 2017-11-18 DIAGNOSIS — Q231 Congenital insufficiency of aortic valve: Secondary | ICD-10-CM | POA: Diagnosis not present

## 2017-11-18 DIAGNOSIS — Z954 Presence of other heart-valve replacement: Secondary | ICD-10-CM | POA: Diagnosis not present

## 2017-11-18 DIAGNOSIS — Z7901 Long term (current) use of anticoagulants: Secondary | ICD-10-CM | POA: Diagnosis not present

## 2017-11-18 LAB — POCT INR: INR: 1.5

## 2017-11-18 NOTE — Patient Instructions (Signed)
Description   Today take 1 tablet,  then start taking 1/2 tablet daily except 1 tablet on Mondays, Wednesdays and Fridays. Recheck in one week.

## 2017-11-18 NOTE — Telephone Encounter (Signed)
[name] called in concerned about a rash on her sternal incision site accompanied by burning and irritating.  She stated that the rash has gone down but is still burning.  Advised the patient that she could use over-the-counter hydrocortisone cream on the rash, however to not use it directly on the incision site.  And she could also use benadryl as needed to aid with the irritation and burning.  Patient acknowledged receipt and told to call if she had any other questions.

## 2017-11-18 NOTE — Telephone Encounter (Signed)
Attempted to call patient in regards to Cardiac Rehab - Lm on Vm °

## 2017-11-19 ENCOUNTER — Encounter: Payer: Self-pay | Admitting: Thoracic Surgery (Cardiothoracic Vascular Surgery)

## 2017-11-19 ENCOUNTER — Ambulatory Visit (INDEPENDENT_AMBULATORY_CARE_PROVIDER_SITE_OTHER): Payer: Self-pay | Admitting: Thoracic Surgery (Cardiothoracic Vascular Surgery)

## 2017-11-19 DIAGNOSIS — L03313 Cellulitis of chest wall: Secondary | ICD-10-CM

## 2017-11-19 DIAGNOSIS — L0390 Cellulitis, unspecified: Secondary | ICD-10-CM | POA: Insufficient documentation

## 2017-11-19 MED ORDER — CEPHALEXIN 500 MG PO CAPS: 500.0000 mg | ORAL_CAPSULE | Freq: Three times a day (TID) | ORAL | 0 refills | Status: DC

## 2017-11-19 NOTE — Patient Instructions (Signed)
Begin taking Keflex ASAP  Keep incision clean and dry  Return to our office for wound check if rash doesn't improve within 7 days

## 2017-11-19 NOTE — Progress Notes (Signed)
[address]       [place] [address]             [phone]     CARDIOTHORACIC SURGERY OFFICE NOTE  Referring Provider is Sueanne Margarita, MD PCP is Biagio Borg, MD   HPI:  Patient returns to our office for unscheduled visit status post minimally invasive aortic valve replacement.  She was seen in our office recently on November 15, 2017 at which time she was doing fairly well.  Over the last several days she has developed an erythematous painful rash surrounding her surgical incision.  She has not had any fevers but she states that she has felt "hot and cold".  She otherwise feels fine.   Current Outpatient Medications  Medication Sig Dispense Refill  . albuterol (PROVENTIL HFA;VENTOLIN HFA) 108 (90 Base) MCG/ACT inhaler Inhale 2 puffs into the lungs every 6 (six) hours as needed for wheezing or shortness of breath. 1 Inhaler 0  . ALPRAZolam (XANAX) 0.5 MG tablet Take 1 tablet (0.5 mg total) by mouth at bedtime as needed for anxiety. 15 tablet 0  . Ascorbic Acid (VITAMIN C PO) Take 1 tablet by mouth daily.    Marland Kitchen aspirin 81 MG EC tablet Take 1 tablet (81 mg total) by mouth daily. 30 tablet 1  . busPIRone (BUSPAR) 30 MG tablet TAKE ONE TABLET BY MOUTH TWICE DAILY AT 10AM AND 5PM (Patient taking differently: Take 30 mg by mouth See admin instructions. Take 30 mg by mouth twice daily at 1000 and 1700) 60 tablet 3  . Calcium Carbonate-Vitamin D (CALCIUM 600/VITAMIN D PO) Take 1 tablet by mouth daily.    . cephALEXin (KEFLEX) 500 MG capsule Take 1 capsule (500 mg total) by mouth 3 (three) times daily. 21 capsule 0  . gabapentin (NEURONTIN) 300 MG capsule TAKE 2 CAPSULES BY MOUTH THREE TIMES A DAY (Patient taking differently: Take 600 mg by mouth 3 times daily) 360 capsule 0  . lamoTRIgine (LAMICTAL) 200 MG tablet TAKE 2 TABLETS (400 MG TOTAL) BY MOUTH DAILY. (Patient taking differently: Take 400 mg by mouth daily. ) 180 tablet 2  . lansoprazole (PREVACID) 15 MG  capsule Take 15 mg by mouth daily as needed (for heartburn or acid reflux).     . metoprolol tartrate (LOPRESSOR) 25 MG tablet Take 0.5 tablets (12.5 mg total) by mouth 2 (two) times daily. 60 tablet 1  . oxyCODONE (OXY IR/ROXICODONE) 5 MG immediate release tablet Take 1 tablet (5 mg total) by mouth every 4 (four) hours as needed for severe pain. 30 tablet 0  . Tetrahydrozoline HCl (VISINE OP) Place 1 drop into both eyes at bedtime as needed (for allergies).    . traMADol (ULTRAM) 50 MG tablet Take 1 tablet (50 mg total) by mouth every 12 (twelve) hours as needed. 60 tablet 0  . warfarin (COUMADIN) 7.5 MG tablet Take 1 tablet (7.5 mg total) by mouth daily at 6 PM. 30 tablet 1   No current facility-administered medications for this visit.       Physical Exam:   There were no vitals taken for this visit.  General:  Well-appearing  Chest:   Clear  CV:   Regular rate and rhythm with mechanical heart sounds  Incisions:  Clean and dry with new erythematous maculopapular rash extending out several centimeters surrounding the incision.  There is no drainage.  There is slight separation of the skin near the medial aspect of the incision.  Abdomen:  Soft nontender  Extremities:  Warm and well perfused  Diagnostic Tests:  n/a   Impression:  Patient appears to have developed cellulitis surrounding her surgical incision  Plan:  Will treat with oral Keflex times 7 days.  Patient will return to our office in 3 weeks for follow-up and wound check.  She will call and return sooner if the rash does not improve or gets worse and/or if she begins to develop fevers or significant drainage from her incision.    Valentina Gu. Roxy Manns, MD 11/19/2017 3:16 PM

## 2017-11-24 ENCOUNTER — Ambulatory Visit
Admission: RE | Admit: 2017-11-24 | Discharge: 2017-11-24 | Disposition: A | Discharge status: Home / Self Care | Payer: Managed Care, Other (non HMO) | Source: Ambulatory Visit | Attending: Nurse Practitioner | Admitting: Nurse Practitioner

## 2017-11-24 ENCOUNTER — Ambulatory Visit: Payer: Managed Care, Other (non HMO) | Admitting: Nurse Practitioner

## 2017-11-24 ENCOUNTER — Telehealth (HOSPITAL_COMMUNITY): Payer: Self-pay

## 2017-11-24 ENCOUNTER — Encounter: Payer: Self-pay | Admitting: Nurse Practitioner

## 2017-11-24 VITALS — BP 90/60 | HR 79 | Ht 60.0 in | Wt 114.8 lb

## 2017-11-24 DIAGNOSIS — Z7901 Long term (current) use of anticoagulants: Secondary | ICD-10-CM

## 2017-11-24 DIAGNOSIS — Z954 Presence of other heart-valve replacement: Secondary | ICD-10-CM

## 2017-11-24 LAB — CBC
Hematocrit: 37.3 % (ref 34.0–46.6)
Hemoglobin: 12.2 g/dL (ref 11.1–15.9)
MCH: 29.1 pg (ref 26.6–33.0)
MCHC: 32.7 g/dL (ref 31.5–35.7)
MCV: 89 fL (ref 79–97)
Platelets: 356 10*3/uL (ref 150–379)
RBC: 4.19 x10E6/uL (ref 3.77–5.28)
RDW: 13.8 % (ref 12.3–15.4)
WBC: 7.6 10*3/uL (ref 3.4–10.8)

## 2017-11-24 LAB — BASIC METABOLIC PANEL
BUN/Creatinine Ratio: 17 (ref 9–23)
BUN: 15 mg/dL (ref 6–20)
CO2: 26 mmol/L (ref 20–29)
Calcium: 9.4 mg/dL (ref 8.7–10.2)
Chloride: 102 mmol/L (ref 96–106)
Creatinine, Ser: 0.87 mg/dL (ref 0.57–1.00)
GFR calc Af Amer: 98 mL/min/{1.73_m2} (ref >59)
GFR calc non Af Amer: 85 mL/min/{1.73_m2} (ref >59)
Glucose: 91 mg/dL (ref 65–99)
Potassium: 4.9 mmol/L (ref 3.5–5.2)
Sodium: 144 mmol/L (ref 134–144)

## 2017-11-24 NOTE — Patient Instructions (Addendum)
We will be checking the following labs today - BMET and CBC   Medication Instructions:    Continue with your current medicines.     Testing/Procedures To Be Arranged:  Echocardiogram  Please go to [facility] to [place] on the first floor for a chest Xray - you may walk in.    Follow-Up:   See Dr. Radford Pax in 2 to 3 months    Other Special Instructions:   Remember to use SBE - antibiotics prior to procedures    If you need a refill on your cardiac medications before your next appointment, please call your pharmacy.   Call the [place] office at [phone] if you have any questions, problems or concerns.

## 2017-11-24 NOTE — Telephone Encounter (Signed)
Patient returned phone call Friday afternoon - Returned patients phone call. Patient is interested in program. Scheduled orientation on 12/23/2017 at 8:00am. Patient will attend the 8:15am exc class.

## 2017-11-24 NOTE — Progress Notes (Signed)
CARDIOLOGY OFFICE NOTE  Date:  11/24/2017    [name] Date of Birth: [birth year] Medical Record #[id number]  PCP:  Biagio Borg, MD  Cardiologist:  Radford Pax  Chief Complaint  Patient presents with  . Cardiac Valve Problem    Post hospital visit - seen for Dr. Radford Pax    History of Present Illness: AVIANNA Obrien is a 36 y.o. female who presents today for a post hospital visit. Seen for Dr. Radford Pax.   She has a known history of bicuspid aortic valve and severe aortic stenosis,bipolar disorder with chronic anxiety, tobacco abuse, asthma, and arthritis.  She was originally seen in consultation over 1 year ago and at that time she remained essentially asymptomatic and did not wish to proceed with elective surgical intervention. Over the past year she developed significant decreased exercise tolerance with worsening fatigue and occasional episodes of substernal chest discomfort. She has continued to smoke.   I saw her back in October - got her referred back to Dr. Roxy Manns. She was agreeable to proceeding. Cardiac CT showed no CAD. She underwent a minimally invasive aortic valve replacement on 11/03/2017 with Dr. Roxy Manns.  Post op course was unremarkable.  She did have some expected postop acute blood loss anemia and some expected postop atelectasis on chest x-ray.  She was quite fluid overloaded and required diuresis.  Coumadin was started.   Comes in today. Here with her mom and an interpreter today (for the mom). Saw Dr. Roxy Manns earlier this month for a post op check - she was doing well. Noted to stop aspirin once her INRs stabilize (they have not yet). She continues to NOT smoke - none since December 4th. She has been put on antibiotics for her incision - has had a rash and some delayed healing. She is doing ok. Probably over did things with Christmas yesterday - little tired today. Hard to get comfortable. She notes a rubbing sensation along the lower ribs on the right - feels like a  "xylophone going across". No other discomfort - this seems to have progressed since her visit with Dr. Roxy Manns. Not short of breath. Has a start date for the end of January for cardiac rehab.    Past Medical History:  Diagnosis Date  . Abnormal Pap smear of cervix    --recurrent ascus w/Pos. HR HPV  . ADD (attention deficit disorder)   . Alcohol abuse, in remission 2012  . Allergy   . Anxiety   . Aortic stenosis   . Aortic stenosis due to bicuspid aortic valve 02/20/2014  . Arthritis    In hips  . Asthma    rare inhaler use  . Bicuspid aortic valve    moderate AS by echo 08/2015 with mean AVG 38mmHg and AVA 1.1cm2  . Bipolar disorder ([place])   . Headache   . Hyperlipidemia with target LDL less than 130 01/27/2013  . Hypertension   . Muscle spasms of both lower extremities    both hips  . 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 Tobacco abuse 09/27/2015  . 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 ABDOMINAL HYSTERECTOMY  02/06/2009   Robotic total laparoscopic hysterectomy  . ANTERIOR CRUCIATE LIGAMENT REPAIR  1993  .  AORTIC VALVE REPLACEMENT N/A 11/03/2017   Procedure: MINIMALLY INVASIVE AORTIC VALVE REPLACEMENT( MINI THORACOTOMY);  Surgeon: Rexene Alberts, MD;  Location: [facility];  Service: Open Heart Surgery;  Laterality: N/A;  . CARDIAC CATHETERIZATION  June 2015   no CAD - moderate AS noted  . 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 COLPOSCOPY  10/2008   CIN I & II  . COLPOSCOPY  07/2000   Neg. ECC  . COLPOSCOPY  06/2001   CIN I  . COLPOSCOPY  08/2004   ECC--atypia  . COLPOSCOPY N/A 01/21/2016   Procedure: COLPOSCOPY with vaginal biopsy with CO 2 Laser of Vaginal and vulvar  condyloma;  Surgeon: [name], MD;  Location: [facility];  Service: Gynecology;  Laterality: N/A;  [name] will be here 2/21 for 1115 case confirmed 01/16/15 - TS  . HERNIA REPAIR  1981/1982  . HIP SURGERY  1981   Hip Reset   . HIP SURGERY  1990   Plate was reconstructed/ took out growth plate in Right knee  . HIP SURGERY  1992   Plate removed in Left hip  . LEFT AND RIGHT HEART CATHETERIZATION WITH CORONARY ANGIOGRAM N/A 05/18/2014   Procedure: LEFT AND RIGHT HEART CATHETERIZATION WITH CORONARY ANGIOGRAM;  Surgeon: Jettie Booze, MD;  Location[facility];  Service: Cardiovascular;  Laterality: N/A;  . LYMPH NODE BIOPSY  1995  . NASAL SEPTUM SURGERY  2002  . TEE WITHOUT CARDIOVERSION N/A 11/03/2017   Procedure: TRANSESOPHAGEAL ECHOCARDIOGRAM (TEE);  Surgeon: Rexene Alberts, MD;  Location: [facility];  Service: Open Heart Surgery;  Laterality: N/A;  . TONSILLECTOMY AND ADENOIDECTOMY  1990  . TYMPANOSTOMY TUBE PLACEMENT  1981/1982     Medications: Current Meds  Medication Sig  . albuterol (PROVENTIL HFA;VENTOLIN HFA) 108 (90 Base) MCG/ACT inhaler Inhale 2 puffs into the lungs every 6 (six) hours as needed for wheezing or shortness of breath.  . ALPRAZolam (XANAX) 0.5 MG tablet Take 1 tablet (0.5 mg total) by mouth at bedtime as needed for anxiety.  . Ascorbic Acid (VITAMIN C PO) Take 1 tablet by mouth daily.  Marland Kitchen aspirin 81 MG EC tablet Take 1 tablet (81 mg total) by mouth daily.  . busPIRone (BUSPAR) 30 MG tablet TAKE ONE TABLET BY MOUTH TWICE DAILY AT 10AM AND 5PM (Patient taking differently: Take 30 mg by mouth See admin instructions. Take 30 mg by mouth twice daily at 1000 and 1700)  . Calcium Carbonate-Vitamin D (CALCIUM 600/VITAMIN D PO) Take 1 tablet by mouth daily.  . cephALEXin (KEFLEX) 500 MG capsule Take 1 capsule (500 mg total) by mouth 3 (three) times daily.  Marland Kitchen gabapentin (NEURONTIN) 300 MG capsule TAKE 2 CAPSULES BY MOUTH THREE TIMES A DAY (Patient taking  differently: Take 600 mg by mouth 3 times daily)  . lamoTRIgine (LAMICTAL) 200 MG tablet TAKE 2 TABLETS (400 MG TOTAL) BY MOUTH DAILY. (Patient taking differently: Take 400 mg by mouth daily. )  . lansoprazole (PREVACID) 15 MG capsule Take 15 mg by mouth daily as needed (for heartburn or acid reflux).   Marland Kitchen loratadine (LORADAMED) 10 MG tablet Take 10 mg by mouth daily.  . metoprolol tartrate (LOPRESSOR) 25 MG tablet Take 0.5 tablets (12.5 mg total) by mouth 2 (two) times daily.  Marland Kitchen oxyCODONE (OXY IR/ROXICODONE) 5 MG immediate release tablet Take 1 tablet (5 mg total) by mouth every 4 (four) hours as needed for severe  pain.  . Tetrahydrozoline HCl (VISINE OP) Place 1 drop into both eyes at bedtime as needed (for allergies).  . traMADol (ULTRAM) 50 MG tablet Take 1 tablet (50 mg total) by mouth every 12 (twelve) hours as needed.  . warfarin (COUMADIN) 7.5 MG tablet Take 1 tablet (7.5 mg total) by mouth daily at 6 PM.     Allergies: Allergies  Allergen Reactions  . Demerol Nausea And Vomiting  . Ibuprofen Other (See Comments)    Gastritis   . Morphine And Related Nausea And Vomiting  . Codeine Itching    Social History: The patient  reports that she quit smoking about 3 weeks ago. Her smoking use included cigarettes. She has a 6.00 pack-year smoking history. she has never used smokeless tobacco. She reports that she does not drink alcohol or use drugs.   Family History: The patient's family history includes Hyperlipidemia in her brother, father, and mother; Hypertension in her brother, father, and mother; Migraines in her father; Thyroid disease in her maternal grandfather, maternal grandmother, and mother.   Review of Systems: Please see the history of present illness.   Otherwise, the review of systems is positive for none.   All other systems are reviewed and negative.   Physical Exam: VS:  BP 90/60 (BP Location: Left Arm, Patient Position: Sitting, Cuff Size: Normal)   Pulse 79   Ht  5' (1.524 m)   Wt 114 lb 12.8 oz (52.1 kg)   BMI 22.42 kg/m  .  BMI Body mass index is 22.42 kg/m.  Wt Readings from Last 3 Encounters:  11/24/17 114 lb 12.8 oz (52.1 kg)  11/15/17 111 lb (50.3 kg)  11/09/17 116 lb 8 oz (52.8 kg)    General: Pleasant. Thin. Alert and in no acute distress.   HEENT: Normal.  Neck: Supple, no JVD, carotid bruits, or masses noted.  Cardiac: Regular rate and rhythm. Valve is crisp.  No edema. Some delayed healing over the upper right chest incision.  Respiratory:  Lungs are clear - she does have a pleural rub noted on the right.  GI: Soft and nontender.  MS: No deformity or atrophy. Gait and ROM intact.  Skin: Warm and dry. Color is normal.  Neuro:  Strength and sensation are intact and no gross focal deficits noted.  Psych: Alert, appropriate and with normal affect.   LABORATORY DATA:  EKG:  EKG is ordered today. This demonstrates NSR.  Lab Results  Component Value Date   WBC 5.7 11/06/2017   HGB 11.5 (L) 11/06/2017   HCT 34.1 (L) 11/06/2017   PLT 96 (L) 11/06/2017   GLUCOSE 120 (H) 11/06/2017   CHOL 254 (H) 08/20/2016   TRIG 67.0 08/20/2016   HDL 79.00 08/20/2016   LDLDIRECT 171.0 01/27/2013   LDLCALC 162 (H) 08/20/2016   ALT 25 11/01/2017   AST 24 11/01/2017   NA 135 11/06/2017   K 4.3 11/06/2017   CL 101 11/06/2017   CREATININE 0.80 11/06/2017   BUN 11 11/06/2017   CO2 27 11/06/2017   TSH 0.64 01/14/2016   INR 1.5 11/18/2017   HGBA1C 5.4 11/01/2017   Lab Results  Component Value Date   INR 1.5 11/18/2017   INR 4.8 11/11/2017   INR 1.66 11/09/2017     BNP (last 3 results) Recent Labs    04/27/17 0155  BNP 29.8    ProBNP (last 3 results) No results for input(s): PROBNP in the last 8760 hours.   Other Studies Reviewed Today:  Procedure:   Minimally Invasive Aortic Valve Replacement Right Anterior Mini-thoracotomy Sorin Carbomedics Top Hat bileaflet mechanical valve (size78mm, catalog [id number], serial  [id number]) Bovine pericardial patch enlargement of the aortic root Plating of right 3rd costal cartilage (KLS titanium plate)    Assessment/Plan:  1. Minimally invasive AVR - doing well. Recheck her labs. Update her echo. Reminded about lifting restrictions. Will get another CXR on her. She is enrolled in rehab. No change in her medicine. Reminded about SBE.   2. Chronic coumadin therapy - remain labile - continue aspirin for now.   3. Tobacco abuse - not smoking.   4. Worsening right sided rib pain - will check another CXR today.   Current medicines are reviewed with the patient today.  The patient does not have concerns regarding medicines other than what has been noted above.  The following changes have been made:  See above.  Labs/ tests ordered today include:    Orders Placed This Encounter  Procedures  . Basic metabolic panel  . CBC  . EKG 12-Lead  . ECHOCARDIOGRAM COMPLETE     Disposition:   FU with Dr. Radford Pax in 2 months.   Patient is agreeable to this plan and will call if any problems develop in the interim.   SignedTruitt Merle, NP  11/24/2017 11:44 AM  [facility] [address] [place], [place]  [address] Phone: [phone] Fax: [phone]

## 2017-11-26 ENCOUNTER — Ambulatory Visit (INDEPENDENT_AMBULATORY_CARE_PROVIDER_SITE_OTHER): Payer: Managed Care, Other (non HMO) | Admitting: *Deleted

## 2017-11-26 ENCOUNTER — Other Ambulatory Visit (HOSPITAL_COMMUNITY): Payer: Managed Care, Other (non HMO)

## 2017-11-26 DIAGNOSIS — Q231 Congenital insufficiency of aortic valve: Secondary | ICD-10-CM | POA: Diagnosis not present

## 2017-11-26 DIAGNOSIS — Q230 Congenital stenosis of aortic valve: Secondary | ICD-10-CM

## 2017-11-26 DIAGNOSIS — Z5181 Encounter for therapeutic drug level monitoring: Secondary | ICD-10-CM | POA: Diagnosis not present

## 2017-11-26 DIAGNOSIS — Z7901 Long term (current) use of anticoagulants: Secondary | ICD-10-CM

## 2017-11-26 DIAGNOSIS — Z954 Presence of other heart-valve replacement: Secondary | ICD-10-CM | POA: Diagnosis not present

## 2017-11-26 LAB — POCT INR: INR: 2.2

## 2017-11-26 NOTE — Patient Instructions (Signed)
Description   Continue  taking 1/2 tablet daily except 1 tablet on Mondays, Wednesdays and Fridays. Recheck in one week.

## 2017-12-01 ENCOUNTER — Ambulatory Visit (HOSPITAL_COMMUNITY): Payer: Managed Care, Other (non HMO) | Attending: Cardiovascular Disease

## 2017-12-01 ENCOUNTER — Other Ambulatory Visit: Payer: Self-pay

## 2017-12-01 DIAGNOSIS — Z954 Presence of other heart-valve replacement: Secondary | ICD-10-CM

## 2017-12-01 DIAGNOSIS — E785 Hyperlipidemia, unspecified: Secondary | ICD-10-CM | POA: Diagnosis not present

## 2017-12-01 DIAGNOSIS — Z720 Tobacco use: Secondary | ICD-10-CM | POA: Insufficient documentation

## 2017-12-01 DIAGNOSIS — Z952 Presence of prosthetic heart valve: Secondary | ICD-10-CM | POA: Insufficient documentation

## 2017-12-01 DIAGNOSIS — I359 Nonrheumatic aortic valve disorder, unspecified: Secondary | ICD-10-CM | POA: Diagnosis present

## 2017-12-01 DIAGNOSIS — Z7901 Long term (current) use of anticoagulants: Secondary | ICD-10-CM | POA: Diagnosis not present

## 2017-12-03 ENCOUNTER — Ambulatory Visit (INDEPENDENT_AMBULATORY_CARE_PROVIDER_SITE_OTHER): Payer: Managed Care, Other (non HMO) | Admitting: *Deleted

## 2017-12-03 DIAGNOSIS — Q231 Congenital insufficiency of aortic valve: Secondary | ICD-10-CM

## 2017-12-03 DIAGNOSIS — Z5181 Encounter for therapeutic drug level monitoring: Secondary | ICD-10-CM | POA: Diagnosis not present

## 2017-12-03 DIAGNOSIS — Z7901 Long term (current) use of anticoagulants: Secondary | ICD-10-CM

## 2017-12-03 DIAGNOSIS — Q230 Congenital stenosis of aortic valve: Secondary | ICD-10-CM | POA: Diagnosis not present

## 2017-12-03 DIAGNOSIS — Z954 Presence of other heart-valve replacement: Secondary | ICD-10-CM | POA: Diagnosis not present

## 2017-12-03 LAB — POCT INR: INR: 1.4

## 2017-12-03 NOTE — Patient Instructions (Signed)
Description   Today January 4th take 1 and 1/2 tablets (11.25mg ) then tomorrow Jan 5th take 1 tablet (7.5mg ) then change coumadin dose to  1 tablet (7.5mg )  daily except 1/2 tablet (3.75mg )  on Tuesdays Thursdays and Saturdays . Recheck in one week.

## 2017-12-07 ENCOUNTER — Other Ambulatory Visit: Payer: Self-pay | Admitting: Physician Assistant

## 2017-12-07 ENCOUNTER — Telehealth: Payer: Self-pay | Admitting: Internal Medicine

## 2017-12-07 MED ORDER — TRAMADOL HCL 50 MG PO TABS: 50.0000 mg | ORAL_TABLET | Freq: Two times a day (BID) | ORAL | 0 refills | Status: DC | PRN

## 2017-12-07 NOTE — Telephone Encounter (Signed)
Copied from [place] [id number]. Topic: Quick Communication - Rx Refill/Question >> Dec 07, 2017 10:43 AM Oliver Pila B wrote: Reason for CRM: pt called b/c her Rx of  traMADol (ULTRAM) 50 MG tablet [[id number]] was denied, pt ask's to be contacted or the pharmacy to be contacted

## 2017-12-07 NOTE — Telephone Encounter (Signed)
Spoke to pt, she is requesting a refill of tramadol 50mg . Pt states she takes 2 tablets every 12 hrs.

## 2017-12-08 MED ORDER — TRAMADOL HCL 50 MG PO TABS: 100.0000 mg | ORAL_TABLET | Freq: Two times a day (BID) | ORAL | 0 refills | Status: DC | PRN

## 2017-12-08 NOTE — Addendum Note (Signed)
Addended by: Lyndal Pulley on: 12/08/2017 01:25 PM   Modules accepted: Orders

## 2017-12-08 NOTE — Telephone Encounter (Signed)
Pt called - she has her last dose of tramadol today , takes 2 every 12 hours  [facility] [place]  Pt says that pharmacy got the wrong dose info cb number of pt is [phone]

## 2017-12-08 NOTE — Telephone Encounter (Signed)
Can you fix this? Pt states she has been taking 2 tablets every 12 hours.

## 2017-12-10 ENCOUNTER — Ambulatory Visit (INDEPENDENT_AMBULATORY_CARE_PROVIDER_SITE_OTHER): Payer: Managed Care, Other (non HMO) | Admitting: *Deleted

## 2017-12-10 DIAGNOSIS — Q230 Congenital stenosis of aortic valve: Secondary | ICD-10-CM | POA: Diagnosis not present

## 2017-12-10 DIAGNOSIS — Z954 Presence of other heart-valve replacement: Secondary | ICD-10-CM

## 2017-12-10 DIAGNOSIS — Z7901 Long term (current) use of anticoagulants: Secondary | ICD-10-CM | POA: Diagnosis not present

## 2017-12-10 DIAGNOSIS — Q231 Congenital insufficiency of aortic valve: Secondary | ICD-10-CM

## 2017-12-10 LAB — POCT INR: INR: 1.7

## 2017-12-10 NOTE — Patient Instructions (Signed)
Description   Today take a total of 1.5 tablets, then change your dose to 1 tablet daily except 1/2 tablet on Wednesdays. Recheck in one week.

## 2017-12-13 ENCOUNTER — Ambulatory Visit (INDEPENDENT_AMBULATORY_CARE_PROVIDER_SITE_OTHER): Payer: Self-pay | Admitting: Thoracic Surgery (Cardiothoracic Vascular Surgery)

## 2017-12-13 ENCOUNTER — Encounter: Payer: Self-pay | Admitting: Thoracic Surgery (Cardiothoracic Vascular Surgery)

## 2017-12-13 VITALS — BP 125/89 | HR 96 | Temp 98.3°F | Resp 20 | Ht 60.0 in | Wt 118.0 lb

## 2017-12-13 DIAGNOSIS — Z954 Presence of other heart-valve replacement: Secondary | ICD-10-CM

## 2017-12-13 NOTE — Patient Instructions (Signed)
Continue all previous medications without any changes at this time  You may continue to gradually increase your physical activity as tolerated.  Refrain from any heavy lifting or strenuous use of your arms and shoulders until at least 8 weeks from the time of your surgery, and avoid activities that cause increased pain in your chest on the side of your surgical incision.  Otherwise you may continue to increase activities without any particular limitations.  Increase the intensity and duration of physical activity gradually.  You are encouraged to enroll and participate in the outpatient cardiac rehab program beginning as soon as practical.  Endocarditis is a potentially serious infection of heart valves or inside lining of the heart.  It occurs more commonly in patients with diseased heart valves (such as patient's with aortic or mitral valve disease) and in patients who have undergone heart valve repair or replacement.  Certain surgical and dental procedures may put you at risk, such as dental cleaning, other dental procedures, or any surgery involving the respiratory, urinary, gastrointestinal tract, gallbladder or prostate gland.   To minimize your chances for develooping endocarditis, maintain good oral health and seek prompt medical attention for any infections involving the mouth, teeth, gums, skin or urinary tract.    Always notify your doctor or dentist about your underlying heart valve condition before having any invasive procedures. You will need to take antibiotics before certain procedures, including all routine dental cleanings or other dental procedures.  Your cardiologist or dentist should prescribe these antibiotics for you to be taken ahead of time.      

## 2017-12-13 NOTE — Progress Notes (Signed)
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  Her postoperative recovery has been uneventful, although she was last seen here in our office on November 19, 2017 at which time she had developed cellulitis surrounding her surgical incision.  She was treated with a 7-day course of oral Keflex and her cellulitis resolved.  She returns to our office today for routine follow-up.  She reports that she is doing very well.  She has been disappointed with the poor weather recently and she wants to get outside and start walking more.  She is plans to start using the treadmill and she is going to enroll in the outpatient cardiac rehab program.  She still has some soreness across the chest but this continues to slowly improve.  She reportedly was subtherapeutic on her most recent INR but she has been working diligently with the team at the Coumadin clinic to adjust her diet and get her anticoagulation under steady control.  Overall she is pleased with her progress.   Current Outpatient Medications  Medication Sig Dispense Refill  . albuterol (PROVENTIL HFA;VENTOLIN HFA) 108 (90 Base) MCG/ACT inhaler Inhale 2 puffs into the lungs every 6 (six) hours as needed for wheezing or shortness of breath. 1 Inhaler 0  . ALPRAZolam (XANAX) 0.5 MG tablet Take 1 tablet (0.5 mg total) by mouth at bedtime as needed for anxiety. 15 tablet 0  . Ascorbic Acid (VITAMIN C PO) Take 1 tablet by mouth daily.    Marland Kitchen aspirin 81 MG EC tablet Take 1 tablet (81 mg total) by mouth daily. 30 tablet 1   . busPIRone (BUSPAR) 30 MG tablet TAKE ONE TABLET BY MOUTH TWICE DAILY AT 10AM AND 5PM (Patient taking differently: Take 30 mg by mouth See admin instructions. Take 30 mg by mouth twice daily at 1000 and 1700) 60 tablet 3  . Calcium Carbonate-Vitamin D (CALCIUM 600/VITAMIN D PO) Take 1 tablet by mouth daily.    Marland Kitchen gabapentin (NEURONTIN) 300 MG capsule TAKE 2 CAPSULES BY MOUTH THREE TIMES A DAY (Patient taking differently: Take 600 mg by mouth 3 times daily) 360 capsule 0  . lamoTRIgine (LAMICTAL) 200 MG tablet TAKE 2 TABLETS (400 MG TOTAL) BY MOUTH DAILY. (Patient taking differently: Take 400 mg by mouth daily. ) 180 tablet 2  . lansoprazole (PREVACID) 15 MG capsule Take 15 mg by mouth daily as needed (for heartburn or acid reflux).     Marland Kitchen loratadine (LORADAMED) 10 MG tablet Take 10 mg by mouth daily.    . metoprolol tartrate (LOPRESSOR) 25 MG tablet Take 0.5 tablets (12.5 mg total) by mouth 2 (two) times daily. 60 tablet 1  . Tetrahydrozoline HCl (VISINE OP) Place 1 drop into both eyes at bedtime as needed (for allergies).    . traMADol (ULTRAM) 50 MG tablet Take 2 tablets (100 mg total) by mouth every 12 (twelve) hours as needed. 120 tablet 0  . warfarin (COUMADIN) 7.5 MG tablet Take 1 tablet (7.5 mg total)  by mouth daily at 6 PM. 30 tablet 1   No current facility-administered medications for this visit.       Physical Exam:   BP 125/89   Pulse 96   Temp 98.3 F (36.8 C) (Oral)   Resp 20   Ht 5' (1.524 m)   Wt 118 lb (53.5 kg)   SpO2 99% Comment: RA  BMI 23.05 kg/m   General:  Well-appearing  Chest:   Clear to auscultation  CV:   Regular rate and rhythm with mechanical heart valve sounds  Incisions:  Healing nicely, cellulitis has resolved  Abdomen:  Soft nontender  Extremities:  Warm and well perfused  Diagnostic Tests:  Transthoracic Echocardiography  Patient:    Danielle, Obrien MR #:       [id number] Study Date: 12/01/2017 Gender:     F Age:        82 Height:      152.4 cm Weight:     52.1 kg BSA:        1.49 m^2 Pt. Status: Room:   REFERRING    Danielle Obrien  SONOGRAPHER  Danielle Obrien, Danielle Obrien  Danielle Obrien, Danielle Obrien  REFERRING    Danielle Obrien  PERFORMING   Danielle Obrien, Outpatient  ATTENDING    Danielle Latch, MD  cc:  ------------------------------------------------------------------- LV EF: 60% -   65%  ------------------------------------------------------------------- Indications:      (Z95.4).  ------------------------------------------------------------------- History:   PMH:  Alcohol abuse. AVR (mechanical). Acquired from the patient and from the patient&'s chart.  Risk factors:  Current tobacco use. Hypertension. Dyslipidemia.  ------------------------------------------------------------------- Study Conclusions  - Left ventricle: The cavity size was normal. Systolic function was   normal. The estimated ejection fraction was in the range of 60%   to 65%. Wall motion was normal; there were no regional wall   motion abnormalities. Features are consistent with a pseudonormal   left ventricular filling pattern, with concomitant abnormal   relaxation and increased filling pressure (grade 2 diastolic   dysfunction). - Aortic valve: A mechanical prosthesis was present. Transvalvular   velocity was within the normal range. There was no stenosis.   There was trivial regurgitation. Peak velocity (S): 229 cm/s.   Mean gradient (S): 11 mm Hg. - Mitral valve: Transvalvular velocity was within the normal range.   There was no evidence for stenosis. There was trivial   regurgitation. - Right ventricle: The cavity size was normal. Wall thickness was   normal. Systolic function was normal. - Atrial septum: No defect or patent foramen ovale was identified. - Tricuspid valve: There was trivial regurgitation. - Pulmonary arteries: Systolic pressure was within the normal   range. PA peak pressure: 22 mm Hg (S).  Impressions:  -  Since the last echo a mechanical aortic valve prosthesis is now   present and functioning properly.  ------------------------------------------------------------------- Study data:  Comparison was made to the study of 09/09/2017.  Study status:  Routine.  Procedure:  The patient reported no pain pre or post test. Transthoracic echocardiography for left ventricular function evaluation and for assessment of valvular function. Image quality was adequate.  Study completion:  There were no complications.          Transthoracic echocardiography.  M-mode, complete 2D, spectral Doppler, and color Doppler.  Birthdate: Patient birthdate: 05/31/1981.  Age:  Patient is 37 yr old.  Sex: Gender: female.    BMI: 22.4 kg/m^2.  Blood pressure:     90/60 Patient status:  Outpatient.  Study date:  Study date: 12/01/2017. Study time: 11:34 AM.  Location:  [facility]  -------------------------------------------------------------------  ------------------------------------------------------------------- Left ventricle:  The cavity size was normal. Systolic function was normal. The estimated ejection fraction was in the range of 60% to 65%. Wall motion was normal; there were no regional wall motion abnormalities. Features are consistent with a pseudonormal left ventricular filling pattern, with concomitant abnormal relaxation and increased filling pressure (grade 2 diastolic dysfunction).  ------------------------------------------------------------------- Aortic valve:  A mechanical prosthesis was present.  Doppler: Transvalvular velocity was within the normal range. There was no stenosis. There was trivial regurgitation.    VTI ratio of LVOT to aortic valve: 0.51. Valve area (VTI): 0.79 cm^2. Indexed valve area (VTI): 0.53 cm^2/m^2. Peak velocity ratio of LVOT to aortic valve: 0.48. Valve area (Vmax): 0.75 cm^2. Indexed valve area (Vmax): 0.5 cm^2/m^2. Mean velocity ratio of LVOT to aortic  valve: 0.5. Valve area (Vmean): 0.77 cm^2. Indexed valve area (Vmean): 0.51 cm^2/m^2.    Mean gradient (S): 11 mm Hg. Peak gradient (S): 21 mm Hg.  ------------------------------------------------------------------- Aorta:  Aortic root: The aortic root was normal in size.  ------------------------------------------------------------------- Mitral valve:   Structurally normal valve.   Mobility was not restricted.  Doppler:  Transvalvular velocity was within the normal range. There was no evidence for stenosis. There was trivial regurgitation.    Peak gradient (D): 3 mm Hg.  ------------------------------------------------------------------- Left atrium:  The atrium was normal in size.  ------------------------------------------------------------------- Atrial septum:  No defect or patent foramen ovale was identified.   ------------------------------------------------------------------- Right ventricle:  The cavity size was normal. Wall thickness was normal. Systolic function was normal.  ------------------------------------------------------------------- Pulmonic valve:    Structurally normal valve.   Cusp separation was normal.  Doppler:  Transvalvular velocity was within the normal range. There was no evidence for stenosis. There was no regurgitation.  ------------------------------------------------------------------- Tricuspid valve:   Structurally normal valve.    Doppler: Transvalvular velocity was within the normal range. There was trivial regurgitation.  ------------------------------------------------------------------- Pulmonary artery:   The main pulmonary artery was normal-sized. Systolic pressure was within the normal range.  ------------------------------------------------------------------- Right atrium:  The atrium was normal in size.  ------------------------------------------------------------------- Pericardium:  There was no pericardial  effusion.  ------------------------------------------------------------------- Systemic veins: Inferior vena cava: The vessel was normal in size. The respirophasic diameter changes were in the normal range (>= 50%), consistent with normal central venous pressure.  ------------------------------------------------------------------- Measurements   Left ventricle                           Value           Reference  LV ID, ED, PLAX chordal          (L)     40     mm       43 - 52  LV ID, ES, PLAX chordal                  23.7   mm       23 - 38  LV fx shortening, PLAX chordal           41     %        >=29  LV PW thickness, ED                      10.7   mm       ---------  IVS/LV  PW ratio, ED                      0.94            <=1.3  Stroke volume, 2D                        33     ml       ---------  Stroke volume/bsa, 2D                    22     ml/m^2   ---------  LV ejection fraction, 1-p A4C            65     %        ---------  LV end-diastolic volume, 2-p             67     ml       ---------  LV end-systolic volume, 2-p              25     ml       ---------  LV ejection fraction, 2-p                63     %        ---------  Stroke volume, 2-p                       42     ml       ---------  LV end-diastolic volume/bsa, 2-p         45     ml/m^2   ---------  LV end-systolic volume/bsa, 2-p          17     ml/m^2   ---------  Stroke volume/bsa, 2-p                   28.2   ml/m^2   ---------  LV e&', lateral                           7.8    cm/s     ---------  LV E/e&', lateral                         10.88           ---------  LV e&', medial                            6.04   cm/s     ---------  LV E/e&', medial                          14.06           ---------  LV e&', average                           6.92   cm/s     ---------  LV E/e&', average                         12.27           ---------    Ventricular septum  Value           Reference  IVS  thickness, ED                        10.1   mm       ---------    LVOT                                     Value           Reference  LVOT ID, S                               14     mm       ---------  LVOT area                                1.54   cm^2     ---------  LVOT ID                                  14     mm       ---------  LVOT peak velocity, S                    111    cm/s     ---------  LVOT mean velocity, S                    77.1   cm/s     ---------  LVOT VTI, S                              21.7   cm       ---------  LVOT peak gradient, S                    5      mm Hg    ---------  Stroke volume (SV), LVOT DP              33.4   ml       ---------  Stroke index (SV/bsa), LVOT DP           22.4   ml/m^2   ---------    Aortic valve                             Value           Reference  Aortic valve peak velocity, S            229    cm/s     ---------  Aortic valve mean velocity, S            155    cm/s     ---------  Aortic valve VTI, S                      42.4   cm       ---------  Aortic mean gradient, S  11     mm Hg    ---------  Aortic peak gradient, S                  21     mm Hg    ---------  VTI ratio, LVOT/AV                       0.51            ---------  Aortic valve area, VTI                   0.79   cm^2     ---------  Aortic valve area/bsa, VTI               0.53   cm^2/m^2 ---------  Velocity ratio, peak, LVOT/AV            0.48            ---------  Aortic valve area, peak velocity         0.75   cm^2     ---------  Aortic valve area/bsa, peak              0.5    cm^2/m^2 ---------  velocity  Velocity ratio, mean, LVOT/AV            0.5             ---------  Aortic valve area, mean velocity         0.77   cm^2     ---------  Aortic valve area/bsa, mean              0.51   cm^2/m^2 ---------  velocity    Aorta                                    Value           Reference  Aortic root ID, ED                       29     mm        ---------  Ascending aorta ID, A-P, S               32     mm       ---------    Left atrium                              Value           Reference  LA ID, A-P, ES                           29     mm       ---------  LA ID/bsa, A-P                           1.94   cm/m^2   <=2.2  LA volume, ES, 1-p A4C                   31     ml       ---------  LA volume/bsa, ES, 1-p A4C  20.8   ml/m^2   ---------  LA volume, ES, 1-p A2C                   27     ml       ---------  LA volume/bsa, ES, 1-p A2C               18.1   ml/m^2   ---------    Mitral valve                             Value           Reference  Mitral E-wave peak velocity              84.9   cm/s     ---------  Mitral A-wave peak velocity              57.8   cm/s     ---------  Mitral deceleration time                 229    ms       150 - 230  Mitral peak gradient, D                  3      mm Hg    ---------  Mitral E/A ratio, peak                   1.5             ---------    Pulmonary arteries                       Value           Reference  PA pressure, S, DP                       22     mm Hg    <=30    Tricuspid valve                          Value           Reference  Tricuspid regurg peak velocity           217.18 cm/s     ---------  Tricuspid peak RV-RA gradient            19     mm Hg    ---------  Tricuspid maximal regurg                 217.18 cm/s     ---------  velocity, PISA    Systemic veins                           Value           Reference  Estimated CVP                            3      mm Hg    ---------    Right ventricle                          Value           Reference  RV pressure, S, DP                       22     mm Hg    <=30  RV s&', lateral, S                        11.2   cm/s     ---------  Legend: (L)  and  (H)  mark values outside specified reference range.  ------------------------------------------------------------------- Prepared and Electronically Authenticated  by  Danielle Latch, MD 2019-01-02T14:42:32   Impression:  Patient is doing very well approximately 6 weeks status post minimally invasive aortic valve replacement  Plan:  I have encouraged the patient to continue to gradually increase her physical activity as tolerated with her primary limitation remaining that she refrain from heavy lifting or strenuous use of her arms or shoulders for at least another few weeks.  I think she would benefit from participation in the outpatient cardiac rehab program.  We have not recommended any changes to her current medications.  We have discussed timing with regards to return to work, and the patient desires to hold off for at least another 4-6 weeks so that she can return to work without any lifting restrictions.  The patient has been reminded regarding the importance of dental hygiene and the lifelong need for antibiotic prophylaxis for all dental cleanings and other related invasive procedures.  She Danielle Obrien return to our office for routine follow-up next December, approximately 1 year following her original surgery.  She Danielle Obrien call and return sooner should specific problems or questions arise.    Valentina Gu. Roxy Manns, MD 12/13/2017 12:26 PM

## 2017-12-15 ENCOUNTER — Telehealth (HOSPITAL_COMMUNITY): Payer: Self-pay | Admitting: Pharmacist

## 2017-12-15 NOTE — Telephone Encounter (Signed)
Cardiac Rehab Medication Review by a Pharmacist  Does the patient  feel that his/her medications are working for him/her?  yes  Has the patient been experiencing any side effects to the medications prescribed?  no  Does the patient measure his/her own blood pressure or blood glucose at home?  No--does not have a blood pressure cuff at this time.   Does the patient have any problems obtaining medications due to transportation or finances?   no  Understanding of regimen: good Understanding of indications: good Potential of compliance: good    Pharmacist comments: Danielle Obrien is a 37 y.o. female with an upcoming appointment with cardiac rehab and I contacted today to go over medications. She was very thankful that we called to go over the medications and was very pleasant over the phone. She was complaining of some lingering pain from her operation, but did state that is a was getting better day by day. She had no other questions or concerns for me at this time. Did observe that she would likely benefit from receiving or obtaining a blood pressure cuff as she does not have one.     Jalene Mullet, Pharm.D. PGY1 Pharmacy Resident 12/15/2017 7:30 PM Main Pharmacy: [phone]

## 2017-12-17 ENCOUNTER — Ambulatory Visit (INDEPENDENT_AMBULATORY_CARE_PROVIDER_SITE_OTHER): Payer: Managed Care, Other (non HMO) | Admitting: *Deleted

## 2017-12-17 DIAGNOSIS — Z7901 Long term (current) use of anticoagulants: Secondary | ICD-10-CM | POA: Diagnosis not present

## 2017-12-17 DIAGNOSIS — Z954 Presence of other heart-valve replacement: Secondary | ICD-10-CM

## 2017-12-17 DIAGNOSIS — Q230 Congenital stenosis of aortic valve: Secondary | ICD-10-CM

## 2017-12-17 DIAGNOSIS — Q231 Congenital insufficiency of aortic valve: Secondary | ICD-10-CM

## 2017-12-17 LAB — POCT INR: INR: 1.7

## 2017-12-17 NOTE — Patient Instructions (Signed)
Description   Today take an extra 1/2 tablet,  then change your dose to 1 tablet daily except 1.5 tablets on Sundays.  Recheck in one week. Start eating your 4 servings each week of your leafy green vegetables.

## 2017-12-21 ENCOUNTER — Telehealth: Payer: Self-pay

## 2017-12-21 ENCOUNTER — Ambulatory Visit: Payer: Managed Care, Other (non HMO) | Admitting: Nurse Practitioner

## 2017-12-21 ENCOUNTER — Telehealth: Payer: Self-pay | Admitting: Nurse Practitioner

## 2017-12-21 ENCOUNTER — Other Ambulatory Visit: Payer: Self-pay | Admitting: Family Medicine

## 2017-12-21 NOTE — Telephone Encounter (Signed)
Called patient per Danielle Merle, NP with Cardiology.  Patient stated that she was having some discomfort with her sternal incision. Takes tramadol with no relief.  With as described by patient the internal stitches protruding from her sternal wound.  Along with a hardness/ pressure feeling at her chest tube sites.  I told her that the hardness could be scar tissue as part of the healing process.  Patient stated that the incisions looked well, and healing.  She stated that it felt like pins and needles.  I explained to her that this is a normal process of healing but if these symptoms did not clear, to give [place] a call and we would get her in for a appointment, she acknowledged receipt.

## 2017-12-21 NOTE — Telephone Encounter (Signed)
Refill done.  

## 2017-12-21 NOTE — Telephone Encounter (Signed)
Danielle Obrien is calling because she says because she is more nervous going to Dr. Ricard Dillon office because it is in such a private area that she is a little nervous and she is waiting until her mom gets back in town so that she can go with her. Please call   Thanks

## 2017-12-22 NOTE — Telephone Encounter (Signed)
I did talk with Anderson Malta yesterday afternoon - have asked her to speak to Dr. Guy Sandifer office - I called [facility]'s nurse - Caryl Pina who was going to be in touch with her.   Burtis Junes, RN, [facility] [address] [place], [place]  [id number] [phone]

## 2017-12-23 ENCOUNTER — Encounter (HOSPITAL_COMMUNITY): Payer: Self-pay

## 2017-12-23 ENCOUNTER — Encounter (HOSPITAL_COMMUNITY)
Admission: RE | Admit: 2017-12-23 | Discharge: 2017-12-23 | Disposition: A | Discharge status: Home / Self Care | Payer: Managed Care, Other (non HMO) | Source: Ambulatory Visit | Attending: Cardiology | Admitting: Cardiology

## 2017-12-23 VITALS — Ht 60.0 in | Wt 124.6 lb

## 2017-12-23 DIAGNOSIS — Z952 Presence of prosthetic heart valve: Secondary | ICD-10-CM

## 2017-12-23 DIAGNOSIS — Z954 Presence of other heart-valve replacement: Secondary | ICD-10-CM | POA: Insufficient documentation

## 2017-12-23 NOTE — Progress Notes (Signed)
Cardiac Individual Treatment Plan  Patient Details  Name: Danielle Obrien MRN: [id number] Date of Birth: [birth year] Referring Provider:     [facility] from 12/23/2017 in [place]  Referring Provider  Fransico Him MD      Initial Encounter Date:    CARDIAC REHAB PHASE II ORIENTATION from 12/23/2017 in [place]  Date  12/23/17  Referring Provider  Fransico Him MD      Visit Diagnosis: S/P AVR (aortic valve replacement)  Patient's Home Medications on Admission:  Current Outpatient Medications:  .  albuterol (PROVENTIL HFA;VENTOLIN HFA) 108 (90 Base) MCG/ACT inhaler, Inhale 2 puffs into the lungs every 6 (six) hours as needed for wheezing or shortness of breath., Disp: 1 Inhaler, Rfl: 0 .  ALPRAZolam (XANAX) 0.5 MG tablet, Take 1 tablet (0.5 mg total) by mouth at bedtime as needed for anxiety. (Patient not taking: Reported on 12/15/2017), Disp: 15 tablet, Rfl: 0 .  Ascorbic Acid (VITAMIN C PO), Take 1 tablet by mouth daily., Disp: , Rfl:  .  aspirin 81 MG EC tablet, Take 1 tablet (81 mg total) by mouth daily. (Patient not taking: Reported on 12/15/2017), Disp: 30 tablet, Rfl: 1 .  busPIRone (BUSPAR) 30 MG tablet, TAKE ONE TABLET BY MOUTH TWICE DAILY AT 10AM AND 5PM (Patient taking differently: Take 30 mg by mouth See admin instructions. Take 30 mg by mouth twice daily at 1000 and 1700), Disp: 60 tablet, Rfl: 3 .  Calcium Carbonate-Vitamin D (CALCIUM 600/VITAMIN D PO), Take 1 tablet by mouth daily., Disp: , Rfl:  .  gabapentin (NEURONTIN) 300 MG capsule, TAKE 2 CAPSULES BY MOUTH THREE TIMES A DAY, Disp: 360 capsule, Rfl: 1 .  lamoTRIgine (LAMICTAL) 200 MG tablet, TAKE 2 TABLETS (400 MG TOTAL) BY MOUTH DAILY. (Patient taking differently: Take 400 mg by mouth daily. ), Disp: 180 tablet, Rfl: 2 .  lansoprazole (PREVACID) 15 MG capsule, Take 15 mg by mouth daily as needed (for heartburn or acid reflux). ,  Disp: , Rfl:  .  loratadine (LORADAMED) 10 MG tablet, Take 10 mg by mouth daily., Disp: , Rfl:  .  metoprolol tartrate (LOPRESSOR) 25 MG tablet, Take 0.5 tablets (12.5 mg total) by mouth 2 (two) times daily., Disp: 60 tablet, Rfl: 1 .  Tetrahydrozoline HCl (VISINE OP), Place 1 drop into both eyes at bedtime as needed (for allergies)., Disp: , Rfl:  .  traMADol (ULTRAM) 50 MG tablet, Take 2 tablets (100 mg total) by mouth every 12 (twelve) hours as needed., Disp: 120 tablet, Rfl: 0 .  warfarin (COUMADIN) 7.5 MG tablet, Take 1 tablet (7.5 mg total) by mouth daily at 6 PM., Disp: 30 tablet, Rfl: 1  Past Medical History: Past Medical History:  Diagnosis Date  . Abnormal Pap smear of cervix    --recurrent ascus w/Pos. HR HPV  . ADD (attention deficit disorder)   . Alcohol abuse, in remission 2012  . Allergy   . Anxiety   . Aortic stenosis   . Aortic stenosis due to bicuspid aortic valve 02/20/2014  . Arthritis    In hips  . Asthma    rare inhaler use  . Bicuspid aortic valve    moderate AS by echo 08/2015 with mean AVG 59mmHg and AVA 1.1cm2  . Bipolar disorder ([place])   . Headache   . Hyperlipidemia with target LDL less than 130 01/27/2013  . Hypertension   . Muscle spasms of both lower  extremities    both hips  . 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 Tobacco abuse 09/27/2015  . 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 Packs/day: 0.50  . Years: 12.00  . Pack years: 6.00  . Types: Cigarettes  . Last attempt to quit: 11/02/2017  . Years since quitting: 0.1  Smokeless Tobacco Never Used    Labs: Recent Review Flowsheet Data    Labs for ITP Cardiac and Pulmonary Rehab Latest Ref Rng & Units  11/03/2017 11/03/2017 11/03/2017 11/03/2017 11/04/2017   Cholestrol 0 - 200 mg/dL - - - - -   LDLCALC 0 - 99 mg/dL - - - - -   LDLDIRECT mg/dL - - - - -   HDL >39.00 mg/dL - - - - -   Trlycerides 0.0 - 149.0 mg/dL - - - - -   Hemoglobin A1c 4.8 - 5.6 % - - - - -   PHART 7.350 - 7.450 7.312(L) 7.305(L) 7.317(L) - 7.381   PCO2ART 32.0 - 48.0 mmHg 42.7 43.5 36.7 - 33.9   HCO3 20.0 - 28.0 mmol/L 21.6 21.9 18.7(L) - 19.7(L)   TCO2 22 - 32 mmol/L 23 23 20(L) 20(L) 27   ACIDBASEDEF 0.0 - 2.0 mmol/L 4.0(H) 5.0(H) 7.0(H) - 4.5(H)   O2SAT % 97.0 98.0 99.0 - 90.8      Capillary Blood Glucose: Lab Results  Component Value Date   GLUCAP 131 (H) 11/05/2017   GLUCAP 89 11/05/2017   GLUCAP 128 (H) 11/05/2017   GLUCAP 107 (H) 11/05/2017   GLUCAP 148 (H) 11/04/2017     Exercise Target Goals: Date: 12/23/17  Exercise Program Goal: Individual exercise prescription set using results from initial 6 min walk test and THRR while considering  patient's activity barriers and safety.   Exercise Prescription Goal: Initial exercise prescription builds to 30-45 minutes a day of aerobic activity, 2-3 days per week.  Home exercise guidelines will be given to patient during program as part of exercise prescription that the participant will acknowledge.  Activity Barriers & Risk Stratification: Activity Barriers & Cardiac Risk Stratification - 12/23/17 1113      Activity Barriers & Cardiac Risk Stratification   Comments  leg length discrepency, R sided upper extremity limitations, L ACL surgery         6 Minute Walk: 6 Minute Walk    Row Name 12/23/17 1117         6 Minute Walk   Phase  Initial     Distance  1800 feet     Walk Time  6 minutes     # of Rest Breaks  0     MPH  3.4     METS  5.6     RPE  12     Perceived Dyspnea   1     VO2 Peak  19.53     Symptoms  Yes (comment)     Comments  mild SOB     Resting HR  81 bpm     Resting BP  104/60     Resting Oxygen Saturation   97 %      Exercise Oxygen Saturation  during 6 min walk  98 %  Max Ex. HR  99 bpm     Max Ex. BP  124/82     2 Minute Post BP  118/70        Oxygen Initial Assessment:   Oxygen Re-Evaluation:   Oxygen Discharge (Final Oxygen Re-Evaluation):   Initial Exercise Prescription: Initial Exercise Prescription - 12/23/17 1100      Date of Initial Exercise RX and Referring Provider   Date  12/23/17    Referring Provider  Fransico Him MD      Treadmill   MPH  2.4    Grade  0    Minutes  10    METs  2.84      Bike   Level  0.5    Minutes  10    METs  2.72      NuStep   Level  3    SPM  80    Minutes  10    METs  2.4      Prescription Details   Frequency (times per week)  3    Duration  Progress to 30 minutes of continuous aerobic without signs/symptoms of physical distress      Intensity   THRR 40-80% of Max Heartrate  73-146    Ratings of Perceived Exertion  11-15    Perceived Dyspnea  0-4      Progression   Progression  Continue to progress workloads to maintain intensity without signs/symptoms of physical distress.      Resistance Training   Training Prescription  Yes    Weight  2lbs    Reps  10-15       Perform Capillary Blood Glucose checks as needed.  Exercise Prescription Changes:   Exercise Comments:   Exercise Goals and Review: Exercise Goals    Row Name 12/23/17 1118             Exercise Goals   Increase Physical Activity  Yes       Intervention  Provide advice, education, support and counseling about physical activity/exercise needs.;Develop an individualized exercise prescription for aerobic and resistive training based on initial evaluation findings, risk stratification, comorbidities and participant's personal goals.       Expected Outcomes  Short Term: Attend rehab on a regular basis to increase amount of physical activity.;Long Term: Add in home exercise to make exercise part of routine and to increase amount of physical activity.;Long  Term: Exercising regularly at least 3-5 days a week.       Increase Strength and Stamina  Yes       Intervention  Provide advice, education, support and counseling about physical activity/exercise needs.;Develop an individualized exercise prescription for aerobic and resistive training based on initial evaluation findings, risk stratification, comorbidities and participant's personal goals.       Expected Outcomes  Short Term: Perform resistance training exercises routinely during rehab and add in resistance training at home;Short Term: Increase workloads from initial exercise prescription for resistance, speed, and METs.;Long Term: Improve cardiorespiratory fitness, muscular endurance and strength as measured by increased METs and functional capacity (6MWT)       Able to understand and use rate of perceived exertion (RPE) scale  Yes       Intervention  Provide education and explanation on how to use RPE scale       Expected Outcomes  Short Term: Able to use RPE daily in rehab to express subjective intensity level;Long Term:  Able to use RPE to guide intensity level when exercising independently  Knowledge and understanding of Target Heart Rate Range (THRR)  Yes       Intervention  Provide education and explanation of THRR including how the numbers were predicted and where they are located for reference       Expected Outcomes  Short Term: Able to state/look up THRR;Short Term: Able to use daily as guideline for intensity in rehab;Long Term: Able to use THRR to govern intensity when exercising independently       Able to check pulse independently  Yes       Intervention  Provide education and demonstration on how to check pulse in carotid and radial arteries.;Review the importance of being able to check your own pulse for safety during independent exercise       Expected Outcomes  Short Term: Able to explain why pulse checking is important during independent exercise;Long Term: Able to check pulse  independently and accurately       Understanding of Exercise Prescription  Yes       Intervention  Provide education, explanation, and written materials on patient's individual exercise prescription       Expected Outcomes  Short Term: Able to explain program exercise prescription;Long Term: Able to explain home exercise prescription to exercise independently          Exercise Goals Re-Evaluation :    Discharge Exercise Prescription (Final Exercise Prescription Changes):   Nutrition:  Target Goals: Understanding of nutrition guidelines, daily intake of sodium 1500mg , cholesterol 200mg , calories 30% from fat and 7% or less from saturated fats, daily to have 5 or more servings of fruits and vegetables.  Biometrics: Pre Biometrics - 12/23/17 1105      Pre Biometrics   Height  5' (1.524 m)    Weight  124 lb 9 oz (56.5 kg)    Waist Circumference  30.5 inches    Hip Circumference  34.5 inches    Waist to Hip Ratio  0.88 %    BMI (Calculated)  24.33    Triceps Skinfold  19 mm    % Body Fat  31.9 %    Grip Strength  28 kg    Flexibility  19 in    Single Leg Stand  19.21 seconds        Nutrition Therapy Plan and Nutrition Goals:   Nutrition Assessments:   Nutrition Goals Re-Evaluation:   Nutrition Goals Re-Evaluation:   Nutrition Goals Discharge (Final Nutrition Goals Re-Evaluation):   Psychosocial: Target Goals: Acknowledge presence or absence of significant depression and/or stress, maximize coping skills, provide positive support system. Participant is able to verbalize types and ability to use techniques and skills needed for reducing stress and depression.  Initial Review & Psychosocial Screening: Initial Psych Review & Screening - 12/23/17 1229      Initial Review   Current issues with  History of Depression;Current Anxiety/Panic;Current Stress Concerns;Current Depression    Source of Stress Concerns  Unable to perform yard/household activities;Unable to  participate in former interests or hobbies;Poor Coping Skills      Family Dynamics   Good Support System?  Yes Pt lives with mother and father during her recovery from surgery. Pt has bipolar and takes buspar and xanax      Barriers   Psychosocial barriers to participate in program  The patient should benefit from training in stress management and relaxation.      Screening Interventions   Interventions  Encouraged to exercise;Provide feedback about the scores to participant;To provide support and resources with  identified psychosocial needs    Expected Outcomes  Short Term goal: Utilizing psychosocial counselor, staff and physician to assist with identification of specific Stressors or current issues interfering with healing process. Setting desired goal for each stressor or current issue identified.;Long Term Goal: Stressors or current issues are controlled or eliminated.;Short Term goal: Identification and review with participant of any Quality of Life or Depression concerns found by scoring the questionnaire.;Long Term goal: The participant improves quality of Life and PHQ9 Scores as seen by post scores and/or verbalization of changes       Quality of Life Scores: Quality of Life - 12/23/17 1132      Quality of Life Scores   Health/Function Pre  23.14 %    Socioeconomic Pre  23.07 %    Psych/Spiritual Pre  19.71 %    Family Pre  24 %    GLOBAL Pre  22.48 %      Scores of 19 and below usually indicate a poorer quality of life in these areas.  A difference of  2-3 points is a clinically meaningful difference.  A difference of 2-3 points in the total score of the Quality of Life Index has been associated with significant improvement in overall quality of life, self-image, physical symptoms, and general health in studies assessing change in quality of life.  PHQ-9: Recent Review Flowsheet Data    Depression screen [facility] 2/9 07/01/2017   Decreased Interest 2   Down, Depressed, Hopeless 2    PHQ - 2 Score 4   Altered sleeping 1   Tired, decreased energy 1   Change in appetite 2   Feeling bad or failure about yourself  1   Trouble concentrating 1   Moving slowly or fidgety/restless 1   Suicidal thoughts 0   PHQ-9 Score 11   Difficult doing work/chores Somewhat difficult     Interpretation of Total Score  Total Score Depression Severity:  1-4 = Minimal depression, 5-9 = Mild depression, 10-14 = Moderate depression, 15-19 = Moderately severe depression, 20-27 = Severe depression   Psychosocial Evaluation and Intervention:   Psychosocial Re-Evaluation:   Psychosocial Discharge (Final Psychosocial Re-Evaluation):   Vocational Rehabilitation: Provide vocational rehab assistance to qualifying candidates.   Vocational Rehab Evaluation & Intervention: Vocational Rehab - 12/23/17 1232      Initial Vocational Rehab Evaluation & Intervention   Assessment shows need for Vocational Rehabilitation  No Pt works at [employer] on [address]       Education: Education Goals: Education classes will be provided on a weekly basis, covering required topics. Participant will state understanding/return demonstration of topics presented.  Learning Barriers/Preferences: Learning Barriers/Preferences - 12/23/17 1022      Learning Barriers/Preferences   Learning Barriers  Hearing    Learning Preferences  Video;Written Material;Computer/Internet;Pictoral       Education Topics: Count Your Pulse:  -Group instruction provided by verbal instruction, demonstration, patient participation and written materials to support subject.  Instructors address importance of being able to find your pulse and how to count your pulse when at home without a heart monitor.  Patients get hands on experience counting their pulse with staff help and individually.   Heart Attack, Angina, and Risk Factor Modification:  -Group instruction provided by verbal instruction, video, and written materials to  support subject.  Instructors address signs and symptoms of angina and heart attacks.    Also discuss risk factors for heart disease and how to make changes to improve heart health risk factors.  Functional Fitness:  -Group instruction provided by verbal instruction, demonstration, patient participation, and written materials to support subject.  Instructors address safety measures for doing things around the house.  Discuss how to get up and down off the floor, how to pick things up properly, how to safely get out of a chair without assistance, and balance training.   Meditation and Mindfulness:  -Group instruction provided by verbal instruction, patient participation, and written materials to support subject.  Instructor addresses importance of mindfulness and meditation practice to help reduce stress and improve awareness.  Instructor also leads participants through a meditation exercise.    Stretching for Flexibility and Mobility:  -Group instruction provided by verbal instruction, patient participation, and written materials to support subject.  Instructors lead participants through series of stretches that are designed to increase flexibility thus improving mobility.  These stretches are additional exercise for major muscle groups that are typically performed during regular warm up and cool down.   Hands Only CPR:  -Group verbal, video, and participation provides a basic overview of AHA guidelines for community CPR. Role-play of emergencies allow participants the opportunity to practice calling for help and chest compression technique with discussion of AED use.   Hypertension: -Group verbal and written instruction that provides a basic overview of hypertension including the most recent diagnostic guidelines, risk factor reduction with self-care instructions and medication management.    Nutrition I class: Heart Healthy Eating:  -Group instruction provided by PowerPoint slides, verbal  discussion, and written materials to support subject matter. The instructor gives an explanation and review of the Therapeutic Lifestyle Changes diet recommendations, which includes a discussion on lipid goals, dietary fat, sodium, fiber, plant stanol/sterol esters, sugar, and the components of a well-balanced, healthy diet.   Nutrition II class: Lifestyle Skills:  -Group instruction provided by PowerPoint slides, verbal discussion, and written materials to support subject matter. The instructor gives an explanation and review of label reading, grocery shopping for heart health, heart healthy recipe modifications, and ways to make healthier choices when eating out.   Diabetes Question & Answer:  -Group instruction provided by PowerPoint slides, verbal discussion, and written materials to support subject matter. The instructor gives an explanation and review of diabetes co-morbidities, pre- and post-prandial blood glucose goals, pre-exercise blood glucose goals, signs, symptoms, and treatment of hypoglycemia and hyperglycemia, and foot care basics.   Diabetes Blitz:  -Group instruction provided by PowerPoint slides, verbal discussion, and written materials to support subject matter. The instructor gives an explanation and review of the physiology behind type 1 and type 2 diabetes, diabetes medications and rational behind using different medications, pre- and post-prandial blood glucose recommendations and Hemoglobin A1c goals, diabetes diet, and exercise including blood glucose guidelines for exercising safely.    Portion Distortion:  -Group instruction provided by PowerPoint slides, verbal discussion, written materials, and food models to support subject matter. The instructor gives an explanation of serving size versus portion size, changes in portions sizes over the last 20 years, and what consists of a serving from each food group.   Stress Management:  -Group instruction provided by verbal  instruction, video, and written materials to support subject matter.  Instructors review role of stress in heart disease and how to cope with stress positively.     Exercising on Your Own:  -Group instruction provided by verbal instruction, power point, and written materials to support subject.  Instructors discuss benefits of exercise, components of exercise, frequency and intensity of exercise, and end points  for exercise.  Also discuss use of nitroglycerin and activating EMS.  Review options of places to exercise outside of rehab.  Review guidelines for sex with heart disease.   Cardiac Drugs I:  -Group instruction provided by verbal instruction and written materials to support subject.  Instructor reviews cardiac drug classes: antiplatelets, anticoagulants, beta blockers, and statins.  Instructor discusses reasons, side effects, and lifestyle considerations for each drug class.   Cardiac Drugs II:  -Group instruction provided by verbal instruction and written materials to support subject.  Instructor reviews cardiac drug classes: angiotensin converting enzyme inhibitors (ACE-I), angiotensin II receptor blockers (ARBs), nitrates, and calcium channel blockers.  Instructor discusses reasons, side effects, and lifestyle considerations for each drug class.   Anatomy and Physiology of the Circulatory System:  Group verbal and written instruction and models provide basic cardiac anatomy and physiology, with the coronary electrical and arterial systems. Review of: AMI, Angina, Valve disease, Heart Failure, Peripheral Artery Disease, Cardiac Arrhythmia, Pacemakers, and the ICD.   Other Education:  -Group or individual verbal, written, or video instructions that support the educational goals of the cardiac rehab program.   Knowledge Questionnaire Score: Knowledge Questionnaire Score - 12/23/17 1127      Knowledge Questionnaire Score   Pre Score  20/24       Core Components/Risk  Factors/Patient Goals at Admission: Personal Goals and Risk Factors at Admission - 12/23/17 1104      Core Components/Risk Factors/Patient Goals on Admission   Hypertension  Yes    Intervention  Provide education on lifestyle modifcations including regular physical activity/exercise, weight management, moderate sodium restriction and increased consumption of fresh fruit, vegetables, and low fat dairy, alcohol moderation, and smoking cessation.;Monitor prescription use compliance.    Expected Outcomes  Short Term: Continued assessment and intervention until BP is < 140/82mm HG in hypertensive participants. < 130/3mm HG in hypertensive participants with diabetes, heart failure or chronic kidney disease.;Long Term: Maintenance of blood pressure at goal levels.    Lipids  Yes    Intervention  Provide education and support for participant on nutrition & aerobic/resistive exercise along with prescribed medications to achieve LDL 70mg , HDL >40mg .    Expected Outcomes  Short Term: Participant states understanding of desired cholesterol values and is compliant with medications prescribed. Participant is following exercise prescription and nutrition guidelines.;Long Term: Cholesterol controlled with medications as prescribed, with individualized exercise RX and with personalized nutrition plan. Value goals: LDL < 70mg , HDL > 40 mg.    Stress  Yes    Intervention  Offer individual and/or small group education and counseling on adjustment to heart disease, stress management and health-related lifestyle change. Teach and support self-help strategies.;Refer participants experiencing significant psychosocial distress to appropriate mental health specialists for further evaluation and treatment. When possible, include family members and significant others in education/counseling sessions.    Expected Outcomes  Short Term: Participant demonstrates changes in health-related behavior, relaxation and other stress  management skills, ability to obtain effective social support, and compliance with psychotropic medications if prescribed.;Long Term: Emotional wellbeing is indicated by absence of clinically significant psychosocial distress or social isolation.       Core Components/Risk Factors/Patient Goals Review:    Core Components/Risk Factors/Patient Goals at Discharge (Final Review):    ITP Comments: ITP Comments    Row Name 12/23/17 1018           ITP Comments  Dr. Fransico Him, Medical Director          Comments: Patient  attended orientation from 0800 to 1000 to review rules and guidelines for program. Completed 6 minute walk test, Intitial ITP, and exercise prescription.  VSS. Telemetry- SR. Pt asymptomatic with slight shortness of breath toward the end of the walk test.  Brief Psychosocial Assessment reveal no immediate barriers to participating in cardiac rehab.  Pt is accompanied by her mother who is deaf for todays orientation.  Pt is staying with her parents during her recovery.  Pt has history of bipolar, depression, anxiety, stress and ADD and is on medication to help.  Pt hopes to be able to return to a routine, back to her home and work.  Pt is looking forward to participating in cardiac rehab. Cherre Huger, BSN Cardiac and Training and development officer

## 2017-12-24 ENCOUNTER — Ambulatory Visit (INDEPENDENT_AMBULATORY_CARE_PROVIDER_SITE_OTHER): Payer: Managed Care, Other (non HMO) | Admitting: Pharmacist

## 2017-12-24 DIAGNOSIS — Z954 Presence of other heart-valve replacement: Secondary | ICD-10-CM

## 2017-12-24 DIAGNOSIS — Q231 Congenital insufficiency of aortic valve: Secondary | ICD-10-CM

## 2017-12-24 DIAGNOSIS — Q230 Congenital stenosis of aortic valve: Secondary | ICD-10-CM

## 2017-12-24 DIAGNOSIS — Z7901 Long term (current) use of anticoagulants: Secondary | ICD-10-CM

## 2017-12-24 LAB — POCT INR: INR: 1.9

## 2017-12-24 NOTE — Patient Instructions (Signed)
Description   Take extra 1/2 tablet then Start taking 1 tablet daily except 1.5 tablets on Sundays and Thursdays.  Recheck in one week. Start eating your 4 servings each week of your leafy green vegetables.

## 2017-12-29 ENCOUNTER — Other Ambulatory Visit: Payer: Self-pay

## 2017-12-29 ENCOUNTER — Encounter (HOSPITAL_COMMUNITY): Payer: Self-pay

## 2017-12-29 ENCOUNTER — Telehealth (HOSPITAL_COMMUNITY): Payer: Self-pay | Admitting: Cardiac Rehabilitation

## 2017-12-29 ENCOUNTER — Encounter (HOSPITAL_COMMUNITY)
Admission: RE | Admit: 2017-12-29 | Discharge: 2017-12-29 | Disposition: A | Discharge status: Home / Self Care | Payer: Managed Care, Other (non HMO) | Source: Ambulatory Visit | Attending: Cardiology | Admitting: Cardiology

## 2017-12-29 ENCOUNTER — Ambulatory Visit (INDEPENDENT_AMBULATORY_CARE_PROVIDER_SITE_OTHER): Payer: Self-pay

## 2017-12-29 ENCOUNTER — Telehealth: Payer: Self-pay | Admitting: Pharmacist

## 2017-12-29 DIAGNOSIS — Z952 Presence of prosthetic heart valve: Secondary | ICD-10-CM

## 2017-12-29 DIAGNOSIS — Z954 Presence of other heart-valve replacement: Secondary | ICD-10-CM | POA: Diagnosis not present

## 2017-12-29 DIAGNOSIS — Z4802 Encounter for removal of sutures: Secondary | ICD-10-CM

## 2017-12-29 NOTE — Telephone Encounter (Signed)
Rexene Alberts, MD  Lowell Guitar, RN; Maddie Brazier, Harlon Flor, [facility]        As long as she is taking her Coumadin she does not need asa   Previous Messages    ----- Message -----  From: Leeroy Bock, [facility]  Sent: 12/29/2017  1:14 PM  To: Rexene Alberts, MD, Lowell Guitar, RN  Subject: RE: cardiac rehab                 I will forward to Dr Roxy Manns regarding aspirin 81mg  daily given recent AVR.   Thanks,  Myrtie Leuthold   ----- Message -----  From: Lowell Guitar, RN  Sent: 12/29/2017 10:35 AM  To: Erskine Emery, [facility]  Subject: cardiac rehab                   Dear Georgina Peer,   Pt started cardiac rehab today. On med list reconciliation she reported that she has stopped ASA 81mg  when she completed her prescription.  I didn't see the order to discontinue this so I wanted make sure this is correct.   Thank you,  Andi Hence, RN, BSN  Cardiac Pulmonary Rehab

## 2017-12-29 NOTE — Progress Notes (Signed)
Patient arrived for nurse visit to remove suture post- procedure.  She had an internal stitch that was protruding through her incision causing pain.  Tried to pull out suture, however it was not long enough to remove.  Patient tolerated procedure well although she stated that it was very tender at the site.  Patient instructed to keep the incision sites clean and dry and told that she can place a bandaid over the tender area to reduce friction against clothing.  Patient acknowledged instructions given. She also requested pain medication because of this pain, I told her that she would need to come in and have Dr. Roxy Manns evaluate her and the need for pain medication.  She acknowledged receipt.

## 2017-12-29 NOTE — Telephone Encounter (Signed)
Ok to discontinue aspirin 81mg  per Dr Roxy Manns. Medication list has been updated.

## 2017-12-29 NOTE — Telephone Encounter (Signed)
-----   Message from Rexene Alberts, MD sent at 12/29/2017  3:44 PM EST ----- Regarding: RE: cardiac rehab As long as she is taking her Coumadin she does not need asa ----- Message ----- From: Leeroy Bock, [facility] Sent: 12/29/2017   1:14 PM To: Rexene Alberts, MD, Lowell Guitar, RN Subject: RE: cardiac rehab                              I will forward to Dr Roxy Manns regarding aspirin 81mg  daily given recent AVR.  Thanks, Megan  ----- Message ----- From: Lowell Guitar, RN Sent: 12/29/2017  10:35 AM To: Erskine Emery, [facility] Subject: cardiac rehab                                  Dear Georgina Peer,  Pt started cardiac rehab today. On med list reconciliation she reported that she has stopped ASA 81mg  when she completed her prescription.   I didn't see the order to discontinue this so I wanted make sure this is correct.   Thank you, Andi Hence, RN, BSN Cardiac Pulmonary Rehab

## 2017-12-29 NOTE — Progress Notes (Signed)
Daily Session Note  Patient Details  Name: Danielle Obrien MRN: [id number] Date of Birth: 09-13-1981 Referring Provider:     CARDIAC REHAB PHASE II ORIENTATION from 12/23/2017 in [place]  Referring Provider  Fransico Him MD      Encounter Date: 12/29/2017  Check In: Session Check In - 12/29/17 0906      Check-In   Location  [facility]    Staff Present  Dorna Bloom, MS, ACSM RCEP, Exercise Physiologist;Joann Rion, RN, BSN;Other;Olinty [place], MS, ACSM CEP, Exercise Physiologist    Supervising physician immediately available to respond to emergencies  [facility] immediately available    Physician(s)  Dr. Tyrell Antonio    Medication changes reported      No    Fall or balance concerns reported     No    Tobacco Cessation  No Change    Warm-up and Cool-down  Performed as group-led instruction    Resistance Training Performed  No    VAD Patient?  No      Pain Assessment   Currently in Pain?  No/denies    Multiple Pain Sites  No       Capillary Blood Glucose: No results found for this or any previous visit (from the past 24 hour(s)).    Social History   Tobacco Use  Smoking Status Former Smoker  . Packs/day: 0.50  . Years: 12.00  . Pack years: 6.00  . Types: Cigarettes  . Last attempt to quit: 11/02/2017  . Years since quitting: 0.1  Smokeless Tobacco Never Used  Tobacco Comment   pt given fake cigarette for hand/mouth association. pt using [facility] for support    Goals Met:  Personal goals reviewed  Goals Unmet:  Not Applicable  Comments: Pt started cardiac rehab today.  Pt tolerated light exercise without difficulty. VSS, telemetry-sinus rhythm,  asymptomatic.  Medication list reconciled. Pt denies barriers to medicaiton compliance.  PSYCHOSOCIAL ASSESSMENT:  PHQ-1. Pt with known history of depression, symptoms currently well managed. Pt exhibits health related anxiety.  Pt concerned about ability  to meet lifting requirements upon returning to work.  Will request order from Dr. Roxy Manns to incorporate weight training into exercise prescription.  Pt has supportive family.  Pt c/o stitch protruding through right upper chest incision.  Dr. Ricard Dillon office made aware, pt worked in to be seen today.  Pt enjoys watching TV such as [employer], [employer] and Law and Order.   Pt oriented to exercise equipment and routine.    Understanding verbalized.   Dr. Fransico Him is Medical Director for Cardiac Rehab at [facility].

## 2017-12-30 ENCOUNTER — Ambulatory Visit (INDEPENDENT_AMBULATORY_CARE_PROVIDER_SITE_OTHER): Payer: Managed Care, Other (non HMO)

## 2017-12-30 DIAGNOSIS — Z954 Presence of other heart-valve replacement: Secondary | ICD-10-CM

## 2017-12-30 DIAGNOSIS — Q230 Congenital stenosis of aortic valve: Secondary | ICD-10-CM | POA: Diagnosis not present

## 2017-12-30 DIAGNOSIS — Q231 Congenital insufficiency of aortic valve: Secondary | ICD-10-CM | POA: Diagnosis not present

## 2017-12-30 DIAGNOSIS — Z7901 Long term (current) use of anticoagulants: Secondary | ICD-10-CM

## 2017-12-30 LAB — POCT INR: INR: 2.2

## 2017-12-30 NOTE — Patient Instructions (Signed)
Description   Continue on same dosage 1 tablet daily except 1.5 tablets on Sundays and Thursdays.  Recheck in one week.

## 2017-12-31 ENCOUNTER — Encounter (HOSPITAL_COMMUNITY)
Admission: RE | Admit: 2017-12-31 | Discharge: 2017-12-31 | Disposition: A | Discharge status: Home / Self Care | Payer: Managed Care, Other (non HMO) | Source: Ambulatory Visit | Attending: Cardiology | Admitting: Cardiology

## 2017-12-31 DIAGNOSIS — Z952 Presence of prosthetic heart valve: Secondary | ICD-10-CM

## 2017-12-31 DIAGNOSIS — Z954 Presence of other heart-valve replacement: Secondary | ICD-10-CM | POA: Diagnosis not present

## 2018-01-02 ENCOUNTER — Other Ambulatory Visit: Payer: Self-pay | Admitting: Nurse Practitioner

## 2018-01-02 DIAGNOSIS — F411 Generalized anxiety disorder: Secondary | ICD-10-CM

## 2018-01-03 ENCOUNTER — Encounter (HOSPITAL_COMMUNITY): Payer: Managed Care, Other (non HMO)

## 2018-01-03 ENCOUNTER — Telehealth (HOSPITAL_COMMUNITY): Payer: Self-pay | Admitting: *Deleted

## 2018-01-04 ENCOUNTER — Other Ambulatory Visit: Payer: Self-pay | Admitting: Physician Assistant

## 2018-01-05 ENCOUNTER — Encounter (HOSPITAL_COMMUNITY)
Admission: RE | Admit: 2018-01-05 | Discharge: 2018-01-05 | Disposition: A | Discharge status: Home / Self Care | Payer: Managed Care, Other (non HMO) | Source: Ambulatory Visit | Attending: Cardiology | Admitting: Cardiology

## 2018-01-05 DIAGNOSIS — Z954 Presence of other heart-valve replacement: Secondary | ICD-10-CM | POA: Diagnosis not present

## 2018-01-05 DIAGNOSIS — Z952 Presence of prosthetic heart valve: Secondary | ICD-10-CM

## 2018-01-05 NOTE — Progress Notes (Signed)
Reviewed home exercise with pt today.  Pt plans to walk for exercise, 2x/week in addition to coming to cardiac rehab.  Reviewed THR, pulse, RPE, sign and symptoms, and when to call 911 or MD.  Also discussed weather considerations and indoor options.  Pt voiced understanding.   Muriel Hannold,MS, ACSM RCEP 

## 2018-01-06 ENCOUNTER — Ambulatory Visit (INDEPENDENT_AMBULATORY_CARE_PROVIDER_SITE_OTHER): Payer: Managed Care, Other (non HMO)

## 2018-01-06 DIAGNOSIS — Z7901 Long term (current) use of anticoagulants: Secondary | ICD-10-CM

## 2018-01-06 DIAGNOSIS — Q231 Congenital insufficiency of aortic valve: Secondary | ICD-10-CM

## 2018-01-06 DIAGNOSIS — Z954 Presence of other heart-valve replacement: Secondary | ICD-10-CM

## 2018-01-06 DIAGNOSIS — Q230 Congenital stenosis of aortic valve: Secondary | ICD-10-CM

## 2018-01-06 LAB — POCT INR: INR: 1.6

## 2018-01-06 NOTE — Patient Instructions (Signed)
Description   Take 1.5 tablets tomorrow, then start taking 1 tablet daily except 1.5 tablets on Sundays, Tuesdays and Thursdays.  Recheck in one week.

## 2018-01-07 ENCOUNTER — Encounter (HOSPITAL_COMMUNITY)
Admission: RE | Admit: 2018-01-07 | Discharge: 2018-01-07 | Disposition: A | Discharge status: Home / Self Care | Payer: Managed Care, Other (non HMO) | Source: Ambulatory Visit | Attending: Cardiology | Admitting: Cardiology

## 2018-01-07 ENCOUNTER — Other Ambulatory Visit: Payer: Self-pay | Admitting: Physician Assistant

## 2018-01-07 ENCOUNTER — Other Ambulatory Visit: Payer: Self-pay | Admitting: Cardiology

## 2018-01-07 DIAGNOSIS — Z954 Presence of other heart-valve replacement: Secondary | ICD-10-CM | POA: Diagnosis not present

## 2018-01-07 DIAGNOSIS — Z952 Presence of prosthetic heart valve: Secondary | ICD-10-CM

## 2018-01-07 MED ORDER — WARFARIN SODIUM 7.5 MG PO TABS: 7.5000 mg | ORAL_TABLET | Freq: Every day | ORAL | 2 refills | Status: DC

## 2018-01-07 NOTE — Addendum Note (Signed)
Addended by: Derrel Nip B on: 01/07/2018 12:07 PM   Modules accepted: Orders

## 2018-01-07 NOTE — Addendum Note (Signed)
Addended by: Derrel Nip B on: 01/07/2018 12:09 PM   Modules accepted: Orders

## 2018-01-07 NOTE — Telephone Encounter (Addendum)
Please refer to refill encounter.

## 2018-01-07 NOTE — Telephone Encounter (Signed)
°*  STAT* If patient is at the pharmacy, call can be transferred to refill team.   1. Which medications need to be refilled? (please list name of each medication and dose if known) Warfarin  2. Which pharmacy/location (including street and city if local pharmacy) is medication to be sent to?[facility] [phone] 3. Do they need a 30 day or 90 day supply?60 and refills

## 2018-01-10 ENCOUNTER — Encounter (HOSPITAL_COMMUNITY)
Admission: RE | Admit: 2018-01-10 | Discharge: 2018-01-10 | Disposition: A | Discharge status: Home / Self Care | Payer: Managed Care, Other (non HMO) | Source: Ambulatory Visit | Attending: Cardiology | Admitting: Cardiology

## 2018-01-10 ENCOUNTER — Telehealth: Payer: Self-pay | Admitting: Nurse Practitioner

## 2018-01-10 ENCOUNTER — Encounter: Payer: Managed Care, Other (non HMO) | Admitting: Thoracic Surgery (Cardiothoracic Vascular Surgery)

## 2018-01-10 DIAGNOSIS — Z954 Presence of other heart-valve replacement: Secondary | ICD-10-CM | POA: Diagnosis not present

## 2018-01-10 DIAGNOSIS — Z952 Presence of prosthetic heart valve: Secondary | ICD-10-CM

## 2018-01-10 NOTE — Telephone Encounter (Signed)
Pt is aware. Pt just want to let you know that she is recovering from the heart surgery and she will get with it ASAP. FYI

## 2018-01-10 NOTE — Progress Notes (Signed)
Danielle Obrien 37 y.o. female DOB: 05/01/1981 MRN: [id number]      Nutrition Note  1. S/P AVR (aortic valve replacement)    Past Medical History:  Diagnosis Date  . Abnormal Pap smear of cervix    --recurrent ascus w/Pos. HR HPV  . ADD (attention deficit disorder)   . Alcohol abuse, in remission 2012  . Allergy   . Anxiety   . Aortic stenosis   . Aortic stenosis due to bicuspid aortic valve 02/20/2014  . Arthritis    In hips  . Asthma    rare inhaler use  . Bicuspid aortic valve    moderate AS by echo 08/2015 with mean AVG 11mmHg and AVA 1.1cm2  . Bipolar disorder ([place])   . Headache   . Hyperlipidemia with target LDL less than 130 01/27/2013  . Hypertension   . Muscle spasms of both lower extremities    both hips  . 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 Tobacco abuse 09/27/2015  . 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 Coumadin noted  HT: Ht Readings from Last 1 Encounters:  12/23/17 5' (1.524 m)    WT: Wt Readings from Last 3 Encounters:  12/23/17 124 lb 9 oz (56.5 kg)  12/13/17 118 lb (53.5 kg)  11/24/17 114 lb 12.8 oz (52.1 kg)     BMI 23.05   Current tobacco use? No   Labs:  Lipid Panel     Component Value Date/Time   CHOL 254 (H) 08/20/2016 0901   TRIG 67.0 08/20/2016 0901   HDL 79.00 08/20/2016 0901   CHOLHDL 3 08/20/2016 0901   VLDL 13.4 08/20/2016 0901   LDLCALC 162 (H) 08/20/2016 0901   LDLDIRECT 171.0 01/27/2013 1425    Lab Results  Component Value Date   HGBA1C 5.4 11/01/2017   CBG (last 3)  No results for input(s): GLUCAP in the last 72 hours.  Nutrition Note Spoke with pt. Nutrition plan and goals reviewed with pt. Pt is following Step 2 of the Therapeutic Lifestyle Changes diet. Pt expressed understanding of the  information reviewed. Pt aware of nutrition education classes offered and plans on attending nutrition classes.  Nutrition Diagnosis ? Food-and nutrition-related knowledge deficit related to lack of exposure to information as related to diagnosis of: ? AVR ?   Nutrition Intervention ? Pt's individual nutrition plan and goals reviewed with pt. ? Benefits of adopting Heart Healthy diet discussed when Medficts reviewed.    Nutrition Goal(s):  ? Pt to identify food quantities necessary to achieve weight loss of 5-7 lb at graduation from cardiac rehab.  ? Pt to describe the benefit of including fruits, vegetables, whole grains, and low-fat dairy products in a heart healthy meal plan.  Plan:  Pt to attend nutrition classes ? Nutrition I ? Nutrition II ? Portion Distortion  Will provide client-centered nutrition education as part of interdisciplinary care.   Monitor and evaluate progress toward nutrition goal with team.  Derek Mound, M.Ed, RD, LDN, CDE 01/10/2018 9:11 AM

## 2018-01-10 NOTE — Telephone Encounter (Signed)
Danielle Obrien send a massage stating we sent in Buspirone 30 mg tablet all strengths is on back order, request for this med to be change. Please advise.

## 2018-01-10 NOTE — Telephone Encounter (Signed)
No substitute for this medication. Needs to f/up with psychiatry about medication chnages as we discussed during her last OV with me.

## 2018-01-12 ENCOUNTER — Encounter (HOSPITAL_COMMUNITY)
Admission: RE | Admit: 2018-01-12 | Discharge: 2018-01-12 | Disposition: A | Discharge status: Home / Self Care | Payer: Managed Care, Other (non HMO) | Source: Ambulatory Visit | Attending: Cardiology | Admitting: Cardiology

## 2018-01-12 ENCOUNTER — Encounter (HOSPITAL_COMMUNITY): Payer: Self-pay

## 2018-01-12 DIAGNOSIS — Z952 Presence of prosthetic heart valve: Secondary | ICD-10-CM

## 2018-01-12 DIAGNOSIS — Z954 Presence of other heart-valve replacement: Secondary | ICD-10-CM | POA: Diagnosis not present

## 2018-01-13 ENCOUNTER — Encounter: Payer: Self-pay | Admitting: Cardiology

## 2018-01-13 ENCOUNTER — Ambulatory Visit (INDEPENDENT_AMBULATORY_CARE_PROVIDER_SITE_OTHER): Payer: Managed Care, Other (non HMO) | Admitting: *Deleted

## 2018-01-13 DIAGNOSIS — Q230 Congenital stenosis of aortic valve: Secondary | ICD-10-CM

## 2018-01-13 DIAGNOSIS — Z954 Presence of other heart-valve replacement: Secondary | ICD-10-CM

## 2018-01-13 DIAGNOSIS — I350 Nonrheumatic aortic (valve) stenosis: Secondary | ICD-10-CM

## 2018-01-13 DIAGNOSIS — Q231 Congenital insufficiency of aortic valve: Secondary | ICD-10-CM | POA: Diagnosis not present

## 2018-01-13 DIAGNOSIS — Z7901 Long term (current) use of anticoagulants: Secondary | ICD-10-CM | POA: Diagnosis not present

## 2018-01-13 LAB — POCT INR: INR: 2.5

## 2018-01-13 NOTE — Patient Instructions (Signed)
Description   Continue taking 1 tablet daily except 1.5 tablets on Sundays, Tuesdays and Thursdays.  Recheck in one week.

## 2018-01-14 ENCOUNTER — Encounter (HOSPITAL_COMMUNITY)
Admission: RE | Admit: 2018-01-14 | Discharge: 2018-01-14 | Disposition: A | Discharge status: Home / Self Care | Payer: Managed Care, Other (non HMO) | Source: Ambulatory Visit | Attending: Cardiology | Admitting: Cardiology

## 2018-01-14 DIAGNOSIS — Z952 Presence of prosthetic heart valve: Secondary | ICD-10-CM

## 2018-01-14 DIAGNOSIS — Z954 Presence of other heart-valve replacement: Secondary | ICD-10-CM | POA: Diagnosis not present

## 2018-01-14 NOTE — Telephone Encounter (Signed)
-----   Message from Sueanne Margarita, MD sent at 01/14/2018 10:47 AM EST ----- Regarding: RE: strength training request I am fine with her doing strenght training  Fransico Him ----- Message ----- From: Dorna Bloom D Sent: 01/14/2018  10:41 AM To: Sueanne Margarita, MD Subject: strength training request                      Patient has been in cardiac rehab approximately 3 weeks and doing is well. Pt has been averaging 3-4 METS and BP/HR has been in stabled with exercise. Pt is interested in doing strength training. If you feel this patient is appropriate to begin strength training please f/u with cardiac rehab staff.   Thanks  [employer]

## 2018-01-17 ENCOUNTER — Encounter (HOSPITAL_COMMUNITY)
Admission: RE | Admit: 2018-01-17 | Discharge: 2018-01-17 | Disposition: A | Discharge status: Home / Self Care | Payer: Managed Care, Other (non HMO) | Source: Ambulatory Visit | Attending: Cardiology | Admitting: Cardiology

## 2018-01-17 DIAGNOSIS — Z952 Presence of prosthetic heart valve: Secondary | ICD-10-CM

## 2018-01-17 DIAGNOSIS — Z954 Presence of other heart-valve replacement: Secondary | ICD-10-CM | POA: Diagnosis not present

## 2018-01-19 ENCOUNTER — Encounter (HOSPITAL_COMMUNITY)
Admission: RE | Admit: 2018-01-19 | Discharge: 2018-01-19 | Disposition: A | Discharge status: Home / Self Care | Payer: Managed Care, Other (non HMO) | Source: Ambulatory Visit | Attending: Cardiology | Admitting: Cardiology

## 2018-01-19 DIAGNOSIS — Z952 Presence of prosthetic heart valve: Secondary | ICD-10-CM

## 2018-01-19 DIAGNOSIS — Z954 Presence of other heart-valve replacement: Secondary | ICD-10-CM | POA: Diagnosis not present

## 2018-01-20 ENCOUNTER — Encounter: Payer: Self-pay | Admitting: Cardiology

## 2018-01-20 ENCOUNTER — Telehealth (HOSPITAL_COMMUNITY): Payer: Self-pay | Admitting: Cardiac Rehabilitation

## 2018-01-20 ENCOUNTER — Ambulatory Visit: Payer: Managed Care, Other (non HMO) | Admitting: Cardiology

## 2018-01-20 ENCOUNTER — Encounter (HOSPITAL_COMMUNITY): Payer: Self-pay

## 2018-01-20 ENCOUNTER — Encounter (INDEPENDENT_AMBULATORY_CARE_PROVIDER_SITE_OTHER): Payer: Self-pay

## 2018-01-20 ENCOUNTER — Ambulatory Visit (INDEPENDENT_AMBULATORY_CARE_PROVIDER_SITE_OTHER): Payer: Managed Care, Other (non HMO) | Admitting: *Deleted

## 2018-01-20 VITALS — BP 118/72 | HR 79 | Resp 97 | Ht 60.0 in | Wt 124.6 lb

## 2018-01-20 DIAGNOSIS — E785 Hyperlipidemia, unspecified: Secondary | ICD-10-CM

## 2018-01-20 DIAGNOSIS — Z7901 Long term (current) use of anticoagulants: Secondary | ICD-10-CM

## 2018-01-20 DIAGNOSIS — Q231 Congenital insufficiency of aortic valve: Secondary | ICD-10-CM

## 2018-01-20 DIAGNOSIS — Z954 Presence of other heart-valve replacement: Secondary | ICD-10-CM | POA: Diagnosis not present

## 2018-01-20 DIAGNOSIS — Q230 Congenital stenosis of aortic valve: Secondary | ICD-10-CM | POA: Diagnosis not present

## 2018-01-20 DIAGNOSIS — I10 Essential (primary) hypertension: Secondary | ICD-10-CM

## 2018-01-20 DIAGNOSIS — Q2381 Bicuspid aortic valve: Secondary | ICD-10-CM

## 2018-01-20 LAB — POCT INR: INR: 2.8

## 2018-01-20 MED ORDER — TRAMADOL HCL 50 MG PO TABS: 100.0000 mg | ORAL_TABLET | ORAL | 0 refills | Status: DC | PRN

## 2018-01-20 MED ORDER — ASPIRIN EC 81 MG PO TBEC: 81.0000 mg | DELAYED_RELEASE_TABLET | Freq: Every day | ORAL | 3 refills | Status: DC

## 2018-01-20 NOTE — Progress Notes (Signed)
Work release letter

## 2018-01-20 NOTE — Patient Instructions (Addendum)
Medication Instructions:  Your physician has recommended you make the following change in your medication: START: aspirin 81 mg once a day  INCREASE: tramadol to 50 mg(2 tablets) in addition to current dosing. Take this at [facility] daily as needed for pain.   If you need a refill on your cardiac medications, please contact your pharmacy first.  Labwork: None ordered   Testing/Procedures: None ordered   Follow-Up: Your physician recommends that you schedule a follow-up appointment in: 2 weeks with PA  Your physician recommends that you schedule a follow-up appointment in: 3 months with Dr. Radford Pax.    Any Other Special Instructions Will Be Listed Below (If Applicable).   Thank you for choosing [facility], RN  [phone]  If you need a refill on your cardiac medications before your next appointment, please call your pharmacy.

## 2018-01-20 NOTE — Telephone Encounter (Signed)
-----   Message from Rexene Alberts, MD sent at 01/20/2018  8:28 AM EST ----- Regarding: RE: cardiac rehab  That's up to her, but I have no problem  ----- Message ----- From: Lowell Guitar, RN Sent: 01/20/2018   6:54 AM To: Rexene Alberts, MD Subject: cardiac rehab                                  Dear Dr. Roxy Manns, Pt  is doing well in cardiac rehab however has expressed concerns about returning to her job at [employer] this soon into her mini AVR recovery. Pt job requires significant upper body lifting and repetitive motions, primarily right sided. Pt would like to extend her leave until 02/11/18 give her more time in cardiac rehab to increase her upper body strength. She is requesting an extension of 2 more weeks.     She is doing very well with aerobic and lower body strength activities. She is scheduled to begin upper body weight training activities on Friday.  The extension of return to work date would allow her to feel more confident in her ability to fully complete her job duties without restrictions.   Please indicate if this is agreeable with you.  Thank you, Andi Hence, RN, BSN Cardiac Pulmonary Rehab

## 2018-01-20 NOTE — Progress Notes (Signed)
Cardiac Individual Treatment Plan  Patient Details  Name: Danielle Obrien MRN: [id number] Date of Birth: [birth year] Referring Provider:     [facility] from 12/23/2017 in [place]  Referring Provider  Fransico Him MD      Initial Encounter Date:    CARDIAC REHAB PHASE II ORIENTATION from 12/23/2017 in [facility]  Date  12/23/17  Referring Provider  Fransico Him MD      Visit Diagnosis: S/P AVR (aortic valve replacement)  Patient's Home Medications on Admission:  Current Outpatient Medications:  .  albuterol (PROVENTIL HFA;VENTOLIN HFA) 108 (90 Base) MCG/ACT inhaler, Inhale 2 puffs into the lungs every 6 (six) hours as needed for wheezing or shortness of breath., Disp: 1 Inhaler, Rfl: 0 .  ALPRAZolam (XANAX) 0.5 MG tablet, Take 1 tablet (0.5 mg total) by mouth at bedtime as needed for anxiety., Disp: 15 tablet, Rfl: 0 .  Ascorbic Acid (VITAMIN C PO), Take 1 tablet by mouth daily., Disp: , Rfl:  .  busPIRone (BUSPAR) 30 MG tablet, TAKE ONE TABLET BY MOUTH TWICE A DAY AT 10AM AND 5PM, Disp: 60 tablet, Rfl: 2 .  Calcium Carbonate-Vitamin D (CALCIUM 600/VITAMIN D PO), Take 1 tablet by mouth daily., Disp: , Rfl:  .  gabapentin (NEURONTIN) 300 MG capsule, TAKE 2 CAPSULES BY MOUTH THREE TIMES A DAY, Disp: 360 capsule, Rfl: 1 .  lamoTRIgine (LAMICTAL) 200 MG tablet, TAKE 2 TABLETS (400 MG TOTAL) BY MOUTH DAILY. (Patient taking differently: Take 400 mg by mouth daily. ), Disp: 180 tablet, Rfl: 2 .  lansoprazole (PREVACID) 15 MG capsule, Take 15 mg by mouth daily as needed (for heartburn or acid reflux). , Disp: , Rfl:  .  loratadine (LORADAMED) 10 MG tablet, Take 10 mg by mouth daily., Disp: , Rfl:  .  metoprolol tartrate (LOPRESSOR) 25 MG tablet, Take 0.5 tablets (12.5 mg total) by mouth 2 (two) times daily., Disp: 60 tablet, Rfl: 1 .  Tetrahydrozoline HCl (VISINE OP), Place 1 drop into both eyes at  bedtime as needed (for allergies)., Disp: , Rfl:  .  traMADol (ULTRAM) 50 MG tablet, Take 2 tablets (100 mg total) by mouth every 12 (twelve) hours as needed., Disp: 120 tablet, Rfl: 0 .  warfarin (COUMADIN) 7.5 MG tablet, Take 1 tablet (7.5 mg total) by mouth daily at 6 PM., Disp: 35 tablet, Rfl: 2  Past Medical History: Past Medical History:  Diagnosis Date  . Abnormal Pap smear of cervix    --recurrent ascus w/Pos. HR HPV  . ADD (attention deficit disorder)   . Alcohol abuse, in remission 2012  . Allergy   . Anxiety   . Aortic stenosis   . Aortic stenosis due to bicuspid aortic valve 02/20/2014  . Arthritis    In hips  . Asthma    rare inhaler use  . Bicuspid aortic valve    moderate AS by echo 08/2015 with mean AVG 74mmHg and AVA 1.1cm2  . Bipolar disorder ([place])   . Headache   . Hyperlipidemia with target LDL less than 130 01/27/2013  . Hypertension   . Muscle spasms of both lower extremities    both hips  . 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 Tobacco abuse 09/27/2015  . 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 Packs/day: 0.50  . Years: 12.00  . Pack years: 6.00  . Types: Cigarettes  . Last attempt to quit: 11/02/2017  . Years since quitting: 0.2  Smokeless Tobacco Never Used  Tobacco Comment   pt given fake cigarette for hand/mouth association. pt using [facility] for support    Labs: Recent Review Flowsheet Data    Labs for ITP Cardiac and Pulmonary Rehab Latest Ref Rng & Units 11/03/2017 11/03/2017 11/03/2017 11/03/2017 11/04/2017   Cholestrol 0 - 200 mg/dL - - - - -   LDLCALC 0 - 99 mg/dL - - - - -   LDLDIRECT mg/dL - - - - -   HDL >39.00 mg/dL - - - - -   Trlycerides 0.0  - 149.0 mg/dL - - - - -   Hemoglobin A1c 4.8 - 5.6 % - - - - -   PHART 7.350 - 7.450 7.312(L) 7.305(L) 7.317(L) - 7.381   PCO2ART 32.0 - 48.0 mmHg 42.7 43.5 36.7 - 33.9   HCO3 20.0 - 28.0 mmol/L 21.6 21.9 18.7(L) - 19.7(L)   TCO2 22 - 32 mmol/L 23 23 20(L) 20(L) 27   ACIDBASEDEF 0.0 - 2.0 mmol/L 4.0(H) 5.0(H) 7.0(H) - 4.5(H)   O2SAT % 97.0 98.0 99.0 - 90.8      Capillary Blood Glucose: Lab Results  Component Value Date   GLUCAP 131 (H) 11/05/2017   GLUCAP 89 11/05/2017   GLUCAP 128 (H) 11/05/2017   GLUCAP 107 (H) 11/05/2017   GLUCAP 148 (H) 11/04/2017     Exercise Target Goals:    Exercise Program Goal: Individual exercise prescription set using results from initial 6 min walk test and THRR while considering  patient's activity barriers and safety.   Exercise Prescription Goal: Initial exercise prescription builds to 30-45 minutes a day of aerobic activity, 2-3 days per week.  Home exercise guidelines will be given to patient during program as part of exercise prescription that the participant will acknowledge.  Activity Barriers & Risk Stratification: Activity Barriers & Cardiac Risk Stratification - 12/23/17 1113      Activity Barriers & Cardiac Risk Stratification   Comments  leg length discrepency, R sided upper extremity limitations, L ACL surgery         6 Minute Walk: 6 Minute Walk    Row Name 12/23/17 1117         6 Minute Walk   Phase  Initial     Distance  1800 feet     Walk Time  6 minutes     # of Rest Breaks  0     MPH  3.4     METS  5.6     RPE  12     Perceived Dyspnea   1     VO2 Peak  19.53     Symptoms  Yes (comment)     Comments  mild SOB     Resting HR  81 bpm     Resting BP  104/60     Resting Oxygen Saturation   97 %     Exercise Oxygen Saturation  during 6 min walk  98 %     Max Ex. HR  99 bpm     Max Ex. BP  124/82     2 Minute Post BP  118/70        Oxygen Initial Assessment:  Oxygen Re-Evaluation:   Oxygen Discharge  (Final Oxygen Re-Evaluation):   Initial Exercise Prescription: Initial Exercise Prescription - 12/23/17 1100      Date of Initial Exercise RX and Referring Provider   Date  12/23/17    Referring Provider  Fransico Him MD      Treadmill   MPH  2.4    Grade  0    Minutes  10    METs  2.84      Bike   Level  0.5    Minutes  10    METs  2.72      NuStep   Level  3    SPM  80    Minutes  10    METs  2.4      Prescription Details   Frequency (times per week)  3    Duration  Progress to 30 minutes of continuous aerobic without signs/symptoms of physical distress      Intensity   THRR 40-80% of Max Heartrate  73-146    Ratings of Perceived Exertion  11-15    Perceived Dyspnea  0-4      Progression   Progression  Continue to progress workloads to maintain intensity without signs/symptoms of physical distress.      Resistance Training   Training Prescription  Yes    Weight  2lbs    Reps  10-15       Perform Capillary Blood Glucose checks as needed.  Exercise Prescription Changes: Exercise Prescription Changes    Row Name 12/29/17 1600 01/17/18 1200           Response to Exercise   Blood Pressure (Admit)  102/64  112/76      Blood Pressure (Exercise)  134/76  128/62      Blood Pressure (Exit)  104/60  106/68      Heart Rate (Admit)  93 bpm  88 bpm      Heart Rate (Exercise)  103 bpm  123 bpm      Heart Rate (Exit)  82 bpm  86 bpm      Rating of Perceived Exertion (Exercise)  12  12      Symptoms  none  none      Comments  pt oriented to exercise equipment 12/29/17  pt oriented to exercise equipment 12/29/17      Duration  Continue with 30 min of aerobic exercise without signs/symptoms of physical distress.  Continue with 30 min of aerobic exercise without signs/symptoms of physical distress.      Intensity  THRR unchanged  THRR unchanged        Progression   Progression  Continue to progress workloads to maintain intensity without signs/symptoms of physical  distress.  Continue to progress workloads to maintain intensity without signs/symptoms of physical distress.      Average METs  3.1  6.9        Resistance Training   Training Prescription  No Relaxation day  No        Treadmill   MPH  2.4  3.3      Grade  0  3      Minutes  10  10      METs  2.84  4.77        Bike   Level  2 upright scifit  4.5 upright scifit      Minutes  10  10      METs  4.1  7.1  NuStep   Level  3  4      SPM  80  100      Minutes  10  10      METs  2.3  7.1        Home Exercise Plan   Plans to continue exercise at  -  Home (comment) walking      Frequency  -  Add 2 additional days to program exercise sessions.      Initial Home Exercises Provided  -  01/14/18         Exercise Comments: Exercise Comments    Row Name 12/29/17 1623 01/13/18 1240         Exercise Comments  Pt completed first session of cardiac rehab today, Pt responded well to exercise prescription. Pt denied symptoms of SOB, dizziness and CP.  Pt will continue to be monitored and exercise program will be progressed as tolerated.  Reviewed METs and goals. Pt is tolerating exercise very well; will continue to monitor activity levels and progress exercise as tolerated         Exercise Goals and Review: Exercise Goals    Row Name 12/23/17 1118             Exercise Goals   Increase Physical Activity  Yes       Intervention  Provide advice, education, support and counseling about physical activity/exercise needs.;Develop an individualized exercise prescription for aerobic and resistive training based on initial evaluation findings, risk stratification, comorbidities and participant's personal goals.       Expected Outcomes  Short Term: Attend rehab on a regular basis to increase amount of physical activity.;Long Term: Add in home exercise to make exercise part of routine and to increase amount of physical activity.;Long Term: Exercising regularly at least 3-5 days a week.        Increase Strength and Stamina  Yes       Intervention  Provide advice, education, support and counseling about physical activity/exercise needs.;Develop an individualized exercise prescription for aerobic and resistive training based on initial evaluation findings, risk stratification, comorbidities and participant's personal goals.       Expected Outcomes  Short Term: Perform resistance training exercises routinely during rehab and add in resistance training at home;Short Term: Increase workloads from initial exercise prescription for resistance, speed, and METs.;Long Term: Improve cardiorespiratory fitness, muscular endurance and strength as measured by increased METs and functional capacity (6MWT)       Able to understand and use rate of perceived exertion (RPE) scale  Yes       Intervention  Provide education and explanation on how to use RPE scale       Expected Outcomes  Short Term: Able to use RPE daily in rehab to express subjective intensity level;Long Term:  Able to use RPE to guide intensity level when exercising independently       Knowledge and understanding of Target Heart Rate Range (THRR)  Yes       Intervention  Provide education and explanation of THRR including how the numbers were predicted and where they are located for reference       Expected Outcomes  Short Term: Able to state/look up THRR;Short Term: Able to use daily as guideline for intensity in rehab;Long Term: Able to use THRR to govern intensity when exercising independently       Able to check pulse independently  Yes       Intervention  Provide education and demonstration on  how to check pulse in carotid and radial arteries.;Review the importance of being able to check your own pulse for safety during independent exercise       Expected Outcomes  Short Term: Able to explain why pulse checking is important during independent exercise;Long Term: Able to check pulse independently and accurately       Understanding of Exercise  Prescription  Yes       Intervention  Provide education, explanation, and written materials on patient's individual exercise prescription       Expected Outcomes  Short Term: Able to explain program exercise prescription;Long Term: Able to explain home exercise prescription to exercise independently          Exercise Goals Re-Evaluation : Exercise Goals Re-Evaluation    Row Name 01/05/18 1106 01/13/18 1238           Exercise Goal Re-Evaluation   Exercise Goals Review  Increase Physical Activity;Understanding of Exercise Prescription;Increase Strength and Stamina;Knowledge and understanding of Target Heart Rate Range (THRR);Able to understand and use rate of perceived exertion (RPE) scale;Able to check pulse independently  Increase Physical Activity;Understanding of Exercise Prescription;Increase Strength and Stamina;Knowledge and understanding of Target Heart Rate Range (THRR);Able to understand and use rate of perceived exertion (RPE) scale;Able to check pulse independently      Comments  Reviewed home exercise with pt today.  Pt plans to walk for exercise, 2x/week in addition to coming to cardiac rehab.  Reviewed THR, pulse, RPE, sign and symptoms, and when to call 911 or MD.  Also discussed weather considerations and indoor options.  Pt voiced understanding.  Pt is walking at home for exercise for 15 minutes 2x/week. Discussed exercise progression( progress by one minute), temperature/emergency precautions.      Expected Outcomes  Pt will continue to improve in cardiorespiratory fitness by coming to cardiac rehab and being compliant with HEP.  Pt will continue to improve in cardiorespiratory fitness by coming to cardiac rehab and being compliant with HEP.          Discharge Exercise Prescription (Final Exercise Prescription Changes): Exercise Prescription Changes - 01/17/18 1200      Response to Exercise   Blood Pressure (Admit)  112/76    Blood Pressure (Exercise)  128/62    Blood  Pressure (Exit)  106/68    Heart Rate (Admit)  88 bpm    Heart Rate (Exercise)  123 bpm    Heart Rate (Exit)  86 bpm    Rating of Perceived Exertion (Exercise)  12    Symptoms  none    Comments  pt oriented to exercise equipment 12/29/17    Duration  Continue with 30 min of aerobic exercise without signs/symptoms of physical distress.    Intensity  THRR unchanged      Progression   Progression  Continue to progress workloads to maintain intensity without signs/symptoms of physical distress.    Average METs  6.9      Resistance Training   Training Prescription  No      Treadmill   MPH  3.3    Grade  3    Minutes  10    METs  4.77      Bike   Level  4.5 upright scifit    Minutes  10    METs  7.1      NuStep   Level  4    SPM  100    Minutes  10    METs  7.1  Home Exercise Plan   Plans to continue exercise at  Home (comment) walking    Frequency  Add 2 additional days to program exercise sessions.    Initial Home Exercises Provided  01/14/18       Nutrition:  Target Goals: Understanding of nutrition guidelines, daily intake of sodium 1500mg , cholesterol 200mg , calories 30% from fat and 7% or less from saturated fats, daily to have 5 or more servings of fruits and vegetables.  Biometrics: Pre Biometrics - 12/23/17 1105      Pre Biometrics   Height  5' (1.524 m)    Weight  124 lb 9 oz (56.5 kg)    Waist Circumference  30.5 inches    Hip Circumference  34.5 inches    Waist to Hip Ratio  0.88 %    BMI (Calculated)  24.33    Triceps Skinfold  19 mm    % Body Fat  31.9 %    Grip Strength  28 kg    Flexibility  19 in    Single Leg Stand  19.21 seconds        Nutrition Therapy Plan and Nutrition Goals: Nutrition Therapy & Goals - 01/10/18 0945      Nutrition Therapy   Diet  Heart Healthy    Drug/Food Interactions  Coumadin/Vit K      Personal Nutrition Goals   Nutrition Goal  Wt loss of 1-2 lb/week to a wt loss goal of 5-7 lb at graduation from [place].     Personal Goal #2  Pt to describe the benefit of including fruits, vegetables, whole grains, and low-fat dairy products in a heart healthy meal plan.      Intervention Plan   Intervention  Prescribe, educate and counsel regarding individualized specific dietary modifications aiming towards targeted core components such as weight, hypertension, lipid management, diabetes, heart failure and other comorbidities.    Expected Outcomes  Short Term Goal: Understand basic principles of dietary content, such as calories, fat, sodium, cholesterol and nutrients.;Long Term Goal: Adherence to prescribed nutrition plan.       Nutrition Assessments: Nutrition Assessments - 01/10/18 0944      MEDFICTS Scores   Pre Score  36       Nutrition Goals Re-Evaluation:   Nutrition Goals Re-Evaluation:   Nutrition Goals Discharge (Final Nutrition Goals Re-Evaluation):   Psychosocial: Target Goals: Acknowledge presence or absence of significant depression and/or stress, maximize coping skills, provide positive support system. Participant is able to verbalize types and ability to use techniques and skills needed for reducing stress and depression.  Initial Review & Psychosocial Screening: Initial Psych Review & Screening - 12/23/17 1229      Initial Review   Current issues with  History of Depression;Current Anxiety/Panic;Current Stress Concerns;Current Depression    Source of Stress Concerns  Unable to perform yard/household activities;Unable to participate in former interests or hobbies;Poor Coping Skills      Family Dynamics   Good Support System?  Yes Pt lives with mother and father during her recovery from surgery. Pt has bipolar and takes buspar and xanax      Barriers   Psychosocial barriers to participate in program  The patient should benefit from training in stress management and relaxation.      Screening Interventions   Interventions  Encouraged to exercise;Provide feedback about  the scores to participant;To provide support and resources with identified psychosocial needs    Expected Outcomes  Short Term goal: Utilizing psychosocial counselor, staff  and physician to assist with identification of specific Stressors or current issues interfering with healing process. Setting desired goal for each stressor or current issue identified.;Long Term Goal: Stressors or current issues are controlled or eliminated.;Short Term goal: Identification and review with participant of any Quality of Life or Depression concerns found by scoring the questionnaire.;Long Term goal: The participant improves quality of Life and PHQ9 Scores as seen by post scores and/or verbalization of changes       Quality of Life Scores: Quality of Life - 12/23/17 1132      Quality of Life Scores   Health/Function Pre  23.14 %    Socioeconomic Pre  23.07 %    Psych/Spiritual Pre  19.71 %    Family Pre  24 %    GLOBAL Pre  22.48 %      Scores of 19 and below usually indicate a poorer quality of life in these areas.  A difference of  2-3 points is a clinically meaningful difference.  A difference of 2-3 points in the total score of the Quality of Life Index has been associated with significant improvement in overall quality of life, self-image, physical symptoms, and general health in studies assessing change in quality of life.  PHQ-9: Recent Review Flowsheet Data    Depression screen [facility] 2/9 12/29/2017 07/01/2017   Decreased Interest 0 2   Down, Depressed, Hopeless 1 2   PHQ - 2 Score 1 4   Altered sleeping - 1   Tired, decreased energy - 1   Change in appetite - 2   Feeling bad or failure about yourself  - 1   Trouble concentrating - 1   Moving slowly or fidgety/restless - 1   Suicidal thoughts - 0   PHQ-9 Score - 11   Difficult doing work/chores - Somewhat difficult     Interpretation of Total Score  Total Score Depression Severity:  1-4 = Minimal depression, 5-9 = Mild depression, 10-14 =  Moderate depression, 15-19 = Moderately severe depression, 20-27 = Severe depression   Psychosocial Evaluation and Intervention: Psychosocial Evaluation - 12/29/17 0942      Psychosocial Evaluation & Interventions   Interventions  Encouraged to exercise with the program and follow exercise prescription;Stress management education;Relaxation education    Comments  pt with history of depression, current health related stress and anxiety concerned about inbility to perform activites as prior to surgery.      Expected Outcomes  pt will exhibit improved outlook with good coping skills.     Continue Psychosocial Services   Follow up required by staff       Psychosocial Re-Evaluation: Psychosocial Re-Evaluation    [place] Name 01/12/18 1055             Psychosocial Re-Evaluation   Current issues with  None Identified;Current Anxiety/Panic       Comments  pt with health related anxiety demonstrates new coping skills and stress management ideas.  pt anxiety improving.         Expected Outcomes  pt will exhibit good coping skills with positive outlook.        Interventions  Encouraged to attend Cardiac Rehabilitation for the exercise;Stress management education;Relaxation education       Continue Psychosocial Services   Follow up required by staff          Psychosocial Discharge (Final Psychosocial Re-Evaluation): Psychosocial Re-Evaluation - 01/12/18 1055      Psychosocial Re-Evaluation   Current issues with  None Identified;Current Anxiety/Panic  Comments  pt with health related anxiety demonstrates new coping skills and stress management ideas.  pt anxiety improving.      Expected Outcomes  pt will exhibit good coping skills with positive outlook.     Interventions  Encouraged to attend Cardiac Rehabilitation for the exercise;Stress management education;Relaxation education    Continue Psychosocial Services   Follow up required by staff       Vocational Rehabilitation: Provide  vocational rehab assistance to qualifying candidates.   Vocational Rehab Evaluation & Intervention: Vocational Rehab - 12/23/17 1232      Initial Vocational Rehab Evaluation & Intervention   Assessment shows need for Vocational Rehabilitation  No Pt works at [employer] on [place]       Education: Education Goals: Education classes will be provided on a weekly basis, covering required topics. Participant will state understanding/return demonstration of topics presented.  Learning Barriers/Preferences: Learning Barriers/Preferences - 12/23/17 1022      Learning Barriers/Preferences   Learning Barriers  Hearing    Learning Preferences  Video;Written Material;Computer/Internet;Pictoral       Education Topics: Count Your Pulse:  -Group instruction provided by verbal instruction, demonstration, patient participation and written materials to support subject.  Instructors address importance of being able to find your pulse and how to count your pulse when at home without a heart monitor.  Patients get hands on experience counting their pulse with staff help and individually.   Heart Attack, Angina, and Risk Factor Modification:  -Group instruction provided by verbal instruction, video, and written materials to support subject.  Instructors address signs and symptoms of angina and heart attacks.    Also discuss risk factors for heart disease and how to make changes to improve heart health risk factors.   Functional Fitness:  -Group instruction provided by verbal instruction, demonstration, patient participation, and written materials to support subject.  Instructors address safety measures for doing things around the house.  Discuss how to get up and down off the floor, how to pick things up properly, how to safely get out of a chair without assistance, and balance training.   Meditation and Mindfulness:  -Group instruction provided by verbal instruction, patient participation, and  written materials to support subject.  Instructor addresses importance of mindfulness and meditation practice to help reduce stress and improve awareness.  Instructor also leads participants through a meditation exercise.    Stretching for Flexibility and Mobility:  -Group instruction provided by verbal instruction, patient participation, and written materials to support subject.  Instructors lead participants through series of stretches that are designed to increase flexibility thus improving mobility.  These stretches are additional exercise for major muscle groups that are typically performed during regular warm up and cool down.   Hands Only CPR:  -Group verbal, video, and participation provides a basic overview of AHA guidelines for community CPR. Role-play of emergencies allow participants the opportunity to practice calling for help and chest compression technique with discussion of AED use.   Hypertension: -Group verbal and written instruction that provides a basic overview of hypertension including the most recent diagnostic guidelines, risk factor reduction with self-care instructions and medication management.    Nutrition I class: Heart Healthy Eating:  -Group instruction provided by PowerPoint slides, verbal discussion, and written materials to support subject matter. The instructor gives an explanation and review of the Therapeutic Lifestyle Changes diet recommendations, which includes a discussion on lipid goals, dietary fat, sodium, fiber, plant stanol/sterol esters, sugar, and the components of a well-balanced,  healthy diet.   CARDIAC REHAB PHASE II EXERCISE from 01/19/2018 in [place]  Date  01/04/18  Educator  RD  Instruction Review Code  2- Demonstrated Understanding      Nutrition II class: Lifestyle Skills:  -Group instruction provided by PowerPoint slides, verbal discussion, and written materials to support subject matter. The  instructor gives an explanation and review of label reading, grocery shopping for heart health, heart healthy recipe modifications, and ways to make healthier choices when eating out.   CARDIAC REHAB PHASE II EXERCISE from 01/19/2018 in [place]  Date  01/18/18  Educator  RD  Instruction Review Code  2- Demonstrated Understanding      Diabetes Question & Answer:  -Group instruction provided by PowerPoint slides, verbal discussion, and written materials to support subject matter. The instructor gives an explanation and review of diabetes co-morbidities, pre- and post-prandial blood glucose goals, pre-exercise blood glucose goals, signs, symptoms, and treatment of hypoglycemia and hyperglycemia, and foot care basics.   Diabetes Blitz:  -Group instruction provided by PowerPoint slides, verbal discussion, and written materials to support subject matter. The instructor gives an explanation and review of the physiology behind type 1 and type 2 diabetes, diabetes medications and rational behind using different medications, pre- and post-prandial blood glucose recommendations and Hemoglobin A1c goals, diabetes diet, and exercise including blood glucose guidelines for exercising safely.    Portion Distortion:  -Group instruction provided by PowerPoint slides, verbal discussion, written materials, and food models to support subject matter. The instructor gives an explanation of serving size versus portion size, changes in portions sizes over the last 20 years, and what consists of a serving from each food group.   Stress Management:  -Group instruction provided by verbal instruction, video, and written materials to support subject matter.  Instructors review role of stress in heart disease and how to cope with stress positively.     CARDIAC REHAB PHASE II EXERCISE from 01/19/2018 in [place]  Date  01/05/18  Instruction Review Code  2-  Demonstrated Understanding      Exercising on Your Own:  -Group instruction provided by verbal instruction, power point, and written materials to support subject.  Instructors discuss benefits of exercise, components of exercise, frequency and intensity of exercise, and end points for exercise.  Also discuss use of nitroglycerin and activating EMS.  Review options of places to exercise outside of rehab.  Review guidelines for sex with heart disease.   Cardiac Drugs I:  -Group instruction provided by verbal instruction and written materials to support subject.  Instructor reviews cardiac drug classes: antiplatelets, anticoagulants, beta blockers, and statins.  Instructor discusses reasons, side effects, and lifestyle considerations for each drug class.   Cardiac Drugs II:  -Group instruction provided by verbal instruction and written materials to support subject.  Instructor reviews cardiac drug classes: angiotensin converting enzyme inhibitors (ACE-I), angiotensin II receptor blockers (ARBs), nitrates, and calcium channel blockers.  Instructor discusses reasons, side effects, and lifestyle considerations for each drug class.   CARDIAC REHAB PHASE II EXERCISE from 01/19/2018 in [place]  Date  01/12/18  Instruction Review Code  2- Demonstrated Understanding      Anatomy and Physiology of the Circulatory System:  Group verbal and written instruction and models provide basic cardiac anatomy and physiology, with the coronary electrical and arterial systems. Review of: AMI, Angina, Valve disease, Heart Failure, Peripheral Artery Disease,  Cardiac Arrhythmia, Pacemakers, and the ICD.   CARDIAC REHAB PHASE II EXERCISE from 01/19/2018 in [place]  Date  01/19/18  Instruction Review Code  2- Demonstrated Understanding      Other Education:  -Group or individual verbal, written, or video instructions that support the educational  goals of the cardiac rehab program.   Holiday Eating Survival Tips:  -Group instruction provided by PowerPoint slides, verbal discussion, and written materials to support subject matter. The instructor gives patients tips, tricks, and techniques to help them not only survive but enjoy the holidays despite the onslaught of food that accompanies the holidays.   Knowledge Questionnaire Score: Knowledge Questionnaire Score - 12/23/17 1127      Knowledge Questionnaire Score   Pre Score  20/24       Core Components/Risk Factors/Patient Goals at Admission: Personal Goals and Risk Factors at Admission - 12/29/17 0936      Core Components/Risk Factors/Patient Goals on Admission   Tobacco Cessation  Yes    Intervention  Assist the participant in steps to quit. Provide individualized education and counseling about committing to Tobacco Cessation, relapse prevention, and pharmacological support that can be provided by physician.;Advice worker, assist with locating and accessing local/national Quit Smoking programs, and support quit date choice.    Expected Outcomes  Long Term: Complete abstinence from all tobacco products for at least 12 months from quit date.       Core Components/Risk Factors/Patient Goals Review:  Goals and Risk Factor Review    Row Name 12/29/17 0939 01/12/18 1051           Core Components/Risk Factors/Patient Goals Review   Personal Goals Review  Weight Management/Obesity;Tobacco Cessation;Hypertension;Lipids;Stress  Weight Management/Obesity;Tobacco Cessation;Hypertension;Lipids;Stress      Review  pt with multiple CAD RF demonstrates eagerness to participate in CR program. pt working with [facility] on tobacco cessation, which she has maintained since surgery. pt given fake cigarette today for hand/mouth association. pt concerned about current low strength/stamina and eager to increase both prior to returning to work at [employer] in 4  weeks.   pt with multiple CAD RF demonstrates eagerness to participate in CR program. pt admits she has slipped and smoked a few cigarettes as stress reliever. pt verbalizes plan to avoid slipping again.  pt given fake cigarette today for hand/mouth association. pt voices continued concerns  about current low strength/stamina and eager to increase both prior to returning to work at [employer] in a few  weeks., however confidence is increasing.       Expected Outcomes  pt will participate in CR exercise, nutrition and lifestyle modification to decrease overall RF.    pt will participate in CR exercise, nutrition and lifestyle modification to decrease overall RF.           Core Components/Risk Factors/Patient Goals at Discharge (Final Review):  Goals and Risk Factor Review - 01/12/18 1051      Core Components/Risk Factors/Patient Goals Review   Personal Goals Review  Weight Management/Obesity;Tobacco Cessation;Hypertension;Lipids;Stress    Review  pt with multiple CAD RF demonstrates eagerness to participate in CR program. pt admits she has slipped and smoked a few cigarettes as stress reliever. pt verbalizes plan to avoid slipping again.  pt given fake cigarette today for hand/mouth association. pt voices continued concerns  about current low strength/stamina and eager to increase both prior to returning to work at [employer] in a few  weeks.,  however confidence is increasing.     Expected Outcomes  pt will participate in CR exercise, nutrition and lifestyle modification to decrease overall RF.         ITP Comments: ITP Comments    Row Name 12/23/17 1018 12/29/17 0930 01/11/18 1331       ITP Comments  Dr. Fransico Him, Medical Director  pt started group exercise sessions today. pt tolerated light activity without difficulty.    30 day ITP review.  pt exhibits eagerness to participate in CR group exercise setting. pt with good attendance and participation.         Comments:

## 2018-01-20 NOTE — Patient Instructions (Signed)
Description   Continue taking 1 tablet daily except 1.5 tablets on Sundays, Tuesdays and Thursdays.  Recheck in one week.

## 2018-01-20 NOTE — Progress Notes (Signed)
Cardiology Office Note:    Date:  01/20/2018   ID:  Danielle Obrien, DOB Jan 21, 1981, MRN [id number]  PCP:  Janith Lima, MD  Cardiologist:  No primary care provider on file.    Referring MD: Biagio Borg, MD   Chief Complaint  Patient presents with  . Aortic Stenosis    History of Present Illness:    Danielle Obrien is a 37 y.o. female with a hx of bicuspid AV with severe AS, bipolar disorder, asthma and arthritis.  She underwent minimally invasive AVR with a 82mm Sorin Carbomedics Top Hat bileaflet mechanical valve via right anterior mini thoracotomy on 11/03/2017 by Dr. Roxy Manns after coronary CT showed no CAD.  She did well post op and was seen back by Truitt Merle, NP on 11/24/2017.    She is here today for followup and is doing well.  She denies any anginal chest pain or pressure,  PND, orthopnea, LE edema, dizziness, palpitations or syncope. She still has some mild DOE and exertional fatigue which she attributes to recovering from surgery.  She is currently in cardiac rehab.  She has been having a lot of pain from her surgical scar above her right breast and says that the scar tissue if very painful.  She is normally on Tramadol 50mg  2 tablets BID for her chronic orthopedic pain but it is not holding the pain from her surgical scar.  She could not get an appt with Dr. Roxy Manns until next week and is very uncomfortable.  She is also very concerned about being able to go back to work as [employer].  She does a lot of heavy lifting and the pain from her chest is very uncomfortable and she is worried that she has not done enough rehab to be able to work and thinks she needs out 2 more weeks prior to going back to work and may need reduced hours for a weeks as her shifts are long.  She is compliant with her meds and is tolerating meds with no SE.    Past Medical History:  Diagnosis Date  . Abnormal Pap smear of cervix    --recurrent ascus w/Pos. HR HPV  . ADD (attention deficit disorder)     . Alcohol abuse, in remission 2012  . Allergy   . Anxiety   . Aortic stenosis   . Aortic stenosis due to bicuspid aortic valve 02/20/2014  . Arthritis    In hips  . Asthma    rare inhaler use  . Bicuspid aortic valve    moderate AS by echo 08/2015 with mean AVG 55mmHg and AVA 1.1cm2  . Bipolar disorder ([place])   . Headache   . Hyperlipidemia with target LDL less than 130 01/27/2013  . Hypertension   . Muscle spasms of both lower extremities    both hips  . 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 Tobacco abuse 09/27/2015  . 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 ABDOMINAL HYSTERECTOMY  02/06/2009   Robotic total laparoscopic hysterectomy  . ANTERIOR CRUCIATE LIGAMENT REPAIR  1993  . AORTIC VALVE REPLACEMENT N/A 11/03/2017   Procedure: MINIMALLY INVASIVE AORTIC VALVE REPLACEMENT( MINI THORACOTOMY);  Surgeon: Rexene Alberts, MD;  Location: [facility];  Service: Open  Heart Surgery;  Laterality: N/A;  . CARDIAC CATHETERIZATION  June 2015   no CAD - moderate AS noted  . 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 COLPOSCOPY  10/2008   CIN I & II  . COLPOSCOPY  07/2000   Neg. ECC  . COLPOSCOPY  06/2001   CIN I  . COLPOSCOPY  08/2004   ECC--atypia  . COLPOSCOPY N/A 01/21/2016   Procedure: COLPOSCOPY with vaginal biopsy with CO 2 Laser of Vaginal and vulvar condyloma;  Surgeon: [name], MD;  Location: [facility];  Service: Gynecology;  Laterality: N/A;  [name] will be here 2/21 for 1115 case confirmed 01/16/15 - TS  . HERNIA REPAIR  1981/1982  . HIP SURGERY  1981   Hip Reset   . HIP SURGERY  1990   Plate was reconstructed/ took out growth plate in Right knee  . HIP SURGERY   1992   Plate removed in Left hip  . LEFT AND RIGHT HEART CATHETERIZATION WITH CORONARY ANGIOGRAM N/A 05/18/2014   Procedure: LEFT AND RIGHT HEART CATHETERIZATION WITH CORONARY ANGIOGRAM;  Surgeon: Jettie Booze, MD;  Location: [facility] C[facility];  Service: Cardiovascular;  Laterality: N/A;  . LYMPH NODE BIOPSY  1995  . NASAL SEPTUM SURGERY  2002  . TEE WITHOUT CARDIOVERSION N/A 11/03/2017   Procedure: TRANSESOPHAGEAL ECHOCARDIOGRAM (TEE);  Surgeon: Rexene Alberts, MD;  Location: [facility];  Service: Open Heart Surgery;  Laterality: N/A;  . TONSILLECTOMY AND ADENOIDECTOMY  1990  . TYMPANOSTOMY TUBE PLACEMENT  1981/1982    Current Medications: Current Meds  Medication Sig  . albuterol (PROVENTIL HFA;VENTOLIN HFA) 108 (90 Base) MCG/ACT inhaler Inhale 2 puffs into the lungs every 6 (six) hours as needed for wheezing or shortness of breath.  . Ascorbic Acid (VITAMIN C PO) Take 1 tablet by mouth daily.  . Calcium Carbonate-Vitamin D (CALCIUM 600/VITAMIN D PO) Take 1 tablet by mouth daily.  Marland Kitchen gabapentin (NEURONTIN) 300 MG capsule TAKE 2 CAPSULES BY MOUTH THREE TIMES A DAY  . lamoTRIgine (LAMICTAL) 200 MG tablet TAKE 2 TABLETS (400 MG TOTAL) BY MOUTH DAILY. (Patient taking differently: Take 400 mg by mouth daily. )  . lansoprazole (PREVACID) 15 MG capsule Take 15 mg by mouth daily as needed (for heartburn or acid reflux).   Marland Kitchen loratadine (LORADAMED) 10 MG tablet Take 10 mg by mouth daily.  . metoprolol tartrate (LOPRESSOR) 25 MG tablet Take 0.5 tablets (12.5 mg total) by mouth 2 (two) times daily.  . Tetrahydrozoline HCl (VISINE OP) Place 1 drop into both eyes at bedtime as needed (for allergies).  . traMADol (ULTRAM) 50 MG tablet Take 2 tablets (100 mg total) by mouth every 12 (twelve) hours as needed.  . warfarin (COUMADIN) 7.5 MG tablet Take 1 tablet (7.5 mg total) by mouth daily at 6 PM.     Allergies:   Demerol; Ibuprofen; and Codeine   Social History   Socioeconomic History  . Marital  status: Single    Spouse name: None  . Number of children: None  . Years of education: None  . Highest education level: None  Social Needs  . Financial resource strain: None  . Food insecurity - worry: None  . Food insecurity - inability: None  . Transportation needs - medical: None  . Transportation needs - non-medical: None  Occupational History  . None  Tobacco Use  . Smoking status: Former Smoker  Packs/day: 0.50    Years: 12.00    Pack years: 6.00    Types: Cigarettes    Last attempt to quit: 11/02/2017    Years since quitting: 0.2  . Smokeless tobacco: Never Used  . Tobacco comment: pt given fake cigarette for hand/mouth association. pt using [facility] for support  Substance and Sexual Activity  . Alcohol use: No    Alcohol/week: 0.0 oz  . Drug use: No  . Sexual activity: Yes    Partners: Male    Birth control/protection: Surgical    Comment: Hysterectomy  Other Topics Concern  . None  Social History Narrative  . None     Family History: The patient's family history includes Hyperlipidemia in her brother, father, and mother; Hypertension in her brother, father, and mother; Migraines in her father; Thyroid disease in her maternal grandfather, maternal grandmother, and mother. There is no history of Cancer.  ROS:   Please see the history of present illness.    ROS  All other systems reviewed and negative.   EKGs/Labs/Other Studies Reviewed:    The following studies were reviewed today: none  EKG:  EKG is not ordered today.    Recent Labs: 04/27/2017: B Natriuretic Peptide 29.8 11/01/2017: ALT 25 11/04/2017: Magnesium 2.4 11/24/2017: BUN 15; Creatinine, Ser 0.87; Hemoglobin 12.2; Platelets 356; Potassium 4.9; Sodium 144   Recent Lipid Panel    Component Value Date/Time   CHOL 254 (H) 08/20/2016 0901   TRIG 67.0 08/20/2016 0901   HDL 79.00 08/20/2016 0901   CHOLHDL 3 08/20/2016 0901   VLDL 13.4 08/20/2016 0901   LDLCALC 162 (H) 08/20/2016  0901   LDLDIRECT 171.0 01/27/2013 1425    Physical Exam:    VS:  BP 118/72   Pulse 79   Resp (!) 97   Ht 5' (1.524 m)   Wt 124 lb 9.6 oz (56.5 kg)   BMI 24.33 kg/m     Wt Readings from Last 3 Encounters:  01/20/18 124 lb 9.6 oz (56.5 kg)  12/23/17 124 lb 9 oz (56.5 kg)  12/13/17 118 lb (53.5 kg)     GEN:  Well nourished, well developed in no acute distress HEENT: Normal NECK: No JVD; No carotid bruits LYMPHATICS: No lymphadenopathy CARDIAC: RRR, no murmurs, rubs, gallops.  Crisp mechanical heart sounds over the AV RESPIRATORY:  Clear to auscultation without rales, wheezing or rhonchi  ABDOMEN: Soft, non-tender, non-distended MUSCULOSKELETAL:  No edema; No deformity  SKIN: Warm and dry NEUROLOGIC:  Alert and oriented x 3 PSYCHIATRIC:  Normal affect   ASSESSMENT:    1. Aortic stenosis due to bicuspid aortic valve   2. Essential hypertension, benign   3. Hyperlipidemia with target LDL less than 130    PLAN:    In order of problems listed above:  1.  Bicuspid AV with severe AS - s/p minimally invasive 30mm Sorin Carbomedics Top Hat bileaflet mechanical valve.  She is doing well but is still having a lot of pain at her incision from some scar tissue that has developed and Tramadol 100mg  BID is not holding her.  She has an appt with Dr. Roxy Manns next week to check her wound.  I will give her an additional Rx for Tramadol 50mg  2 tablets to take inbetween her other 2 doses at 2pm daily as needed for pain.  I have only given her a Rx for 5 days and 10 tablets with no refills.  She also has only been in cardiac rehab for  a few weeks and feels she is too fatigued to go back to work yet.  She works long hours at [employer] and does heavy lifting.  I told her I would write her a note to be out 2 more weeks and have her followup with my PA to see if she is better and can go back to work.  She may need reduced hours for the first 2 weeks back.    She was taken off her ASA by Truitt Merle, NP but I have instructed her to go back on ASA 81mg  daily in addition to her warfarin.  Her INR is followed in our coumadin clinic.  2.  HTN - BP is well controlled on exam today.  She will continue on Lopressor 12.5mg  BID.  3.  Hyperlipidemia - this is followed by her PCP   Medication Adjustments/Labs and Tests Ordered: Current medicines are reviewed at length with the patient today.  Concerns regarding medicines are outlined above.  No orders of the defined types were placed in this encounter.  No orders of the defined types were placed in this encounter.   Signed, Fransico Him, MD  01/20/2018 9:27 AM    [place]

## 2018-01-21 ENCOUNTER — Encounter (HOSPITAL_COMMUNITY)
Admission: RE | Admit: 2018-01-21 | Discharge: 2018-01-21 | Disposition: A | Discharge status: Home / Self Care | Payer: Managed Care, Other (non HMO) | Source: Ambulatory Visit | Attending: Cardiology | Admitting: Cardiology

## 2018-01-21 ENCOUNTER — Telehealth: Payer: Self-pay | Admitting: Cardiology

## 2018-01-21 DIAGNOSIS — Z952 Presence of prosthetic heart valve: Secondary | ICD-10-CM

## 2018-01-21 DIAGNOSIS — Z954 Presence of other heart-valve replacement: Secondary | ICD-10-CM | POA: Diagnosis not present

## 2018-01-21 NOTE — Telephone Encounter (Signed)
Spoke with patient. Patient stated she would bring disability form today to be completed for [employer]. Informed patient to that I would be looking for the form and I would send to Kim to be be completed. She verbalized understanding and thanked me for the call

## 2018-01-21 NOTE — Telephone Encounter (Signed)
Patient dropped off [place] paperwork at our front desk. I have sent this interoffice to our copy service for completion.

## 2018-01-21 NOTE — Telephone Encounter (Signed)
Patient calling, states that there was a form for her disability faxed today from [place]. Patient asks that the form be completed and sent back as soon possible. Patient will released disability 01-23-18, Sunday.   Claim #[id number]

## 2018-01-24 ENCOUNTER — Encounter (HOSPITAL_COMMUNITY)
Admission: RE | Admit: 2018-01-24 | Discharge: 2018-01-24 | Disposition: A | Discharge status: Home / Self Care | Payer: Managed Care, Other (non HMO) | Source: Ambulatory Visit | Attending: Cardiology | Admitting: Cardiology

## 2018-01-24 ENCOUNTER — Other Ambulatory Visit: Payer: Self-pay | Admitting: Thoracic Surgery (Cardiothoracic Vascular Surgery)

## 2018-01-24 DIAGNOSIS — Q2381 Bicuspid aortic valve: Secondary | ICD-10-CM

## 2018-01-24 DIAGNOSIS — Z952 Presence of prosthetic heart valve: Secondary | ICD-10-CM

## 2018-01-24 DIAGNOSIS — Q231 Congenital insufficiency of aortic valve: Secondary | ICD-10-CM

## 2018-01-24 DIAGNOSIS — Z954 Presence of other heart-valve replacement: Secondary | ICD-10-CM | POA: Diagnosis not present

## 2018-01-24 NOTE — Progress Notes (Signed)
Danielle Obrien 37 y.o. female DOB: 01/30/1981 MRN: [id number]      Nutrition Note  1. S/P AVR (aortic valve replacement)    Meds reviewed. Coumadin noted  Nutrition Note Spoke with pt. Nutrition plan reviewed with pt. Pt is following a heart healthy diet. Pt is taking Coumadin and is aware of the need to follow a diet with consistent vitamin K intake. Pt expressed understanding of the information reviewed. Pt aware of nutrition education classes offered and plans on attending nutrition classes.  Nutrition Diagnosis ? Food-and nutrition-related knowledge deficit related to lack of exposure to information as related to diagnosis of: ? AVR ?   Nutrition Intervention ? Pt's individual nutrition plan and goals reviewed with pt.  Nutrition Goal(s):  ? Pt to identify food quantities necessary to achieve weight loss of 5-7 lb at graduation from cardiac rehab.  ? Pt to describe the benefit of including fruits, vegetables, whole grains, and low-fat dairy products in a heart healthy meal plan.  Plan:  Pt to attend nutrition classes ? Nutrition I - met 01/04/18 ? Nutrition II- met 01/18/18 ? Portion Distortion  Will provide client-centered nutrition education as part of interdisciplinary care.   Monitor and evaluate progress toward nutrition goal with team.  Derek Mound, M.Ed, RD, LDN, CDE 01/24/2018 9:46 AM

## 2018-01-25 ENCOUNTER — Ambulatory Visit (INDEPENDENT_AMBULATORY_CARE_PROVIDER_SITE_OTHER): Payer: Self-pay | Admitting: Physician Assistant

## 2018-01-25 VITALS — BP 135/90 | HR 86 | Resp 20 | Ht 60.0 in | Wt 125.0 lb

## 2018-01-25 DIAGNOSIS — L910 Hypertrophic scar: Secondary | ICD-10-CM

## 2018-01-25 DIAGNOSIS — Z954 Presence of other heart-valve replacement: Secondary | ICD-10-CM

## 2018-01-25 NOTE — Progress Notes (Signed)
HPI:  Patient presents with complaint of incisional discomfort.  She underwent Minimally Invasive Aortic Valve Replacement in December 2018.  She presents today stating her incision is on fire.  Its burning constantly and occasionally it feels as if she is getting shocked.  She has tried increasing her Ultram Dose for this without relief.   Current Outpatient Medications  Medication Sig Dispense Refill  . albuterol (PROVENTIL HFA;VENTOLIN HFA) 108 (90 Base) MCG/ACT inhaler Inhale 2 puffs into the lungs every 6 (six) hours as needed for wheezing or shortness of breath. 1 Inhaler 0  . Ascorbic Acid (VITAMIN C PO) Take 1 tablet by mouth daily.    Marland Kitchen aspirin EC 81 MG tablet Take 1 tablet (81 mg total) by mouth daily. 90 tablet 3  . Calcium Carbonate-Vitamin D (CALCIUM 600/VITAMIN D PO) Take 1 tablet by mouth daily.    Marland Kitchen gabapentin (NEURONTIN) 300 MG capsule TAKE 2 CAPSULES BY MOUTH THREE TIMES A DAY 360 capsule 1  . lamoTRIgine (LAMICTAL) 200 MG tablet TAKE 2 TABLETS (400 MG TOTAL) BY MOUTH DAILY. (Patient taking differently: Take 400 mg by mouth daily. ) 180 tablet 2  . lansoprazole (PREVACID) 15 MG capsule Take 15 mg by mouth daily as needed (for heartburn or acid reflux).     Marland Kitchen loratadine (LORADAMED) 10 MG tablet Take 10 mg by mouth daily.    . metoprolol tartrate (LOPRESSOR) 25 MG tablet Take 0.5 tablets (12.5 mg total) by mouth 2 (two) times daily. 60 tablet 1  . Tetrahydrozoline HCl (VISINE OP) Place 1 drop into both eyes at bedtime as needed (for allergies).    . traMADol (ULTRAM) 50 MG tablet Take 2 tablets (100 mg total) by mouth as needed. In addition to current dosage. Take at [facility] daily as needed for pain 10 tablet 0  . warfarin (COUMADIN) 7.5 MG tablet Take 1 tablet (7.5 mg total) by mouth daily at 6 PM. 35 tablet 2   No current facility-administered medications for this visit.     Physical Exam:  BP 135/90   Pulse 86   Resp 20   Ht 5' (1.524 m)   Wt 125 lb (56.7 kg)   SpO2 98%    BMI 24.41 kg/m   Gen: no apparent distress Heart :RRR Lungs: CTA bilaterally Incision: well healed, raised with mild Keloid formation  A/P:  1. 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  However, she is taking 1800 mg this medication daily from her Orthopedist for her hip pain. 2. Will speak with Dr. Roxy Manns for further guidance if there is alternative treatment available 3. RTC prn  Ellwood Handler, PA-C [facility] [phone]

## 2018-01-26 ENCOUNTER — Encounter (HOSPITAL_COMMUNITY)
Admission: RE | Admit: 2018-01-26 | Discharge: 2018-01-26 | Disposition: A | Discharge status: Home / Self Care | Payer: Managed Care, Other (non HMO) | Source: Ambulatory Visit | Attending: Cardiology | Admitting: Cardiology

## 2018-01-26 DIAGNOSIS — Z952 Presence of prosthetic heart valve: Secondary | ICD-10-CM

## 2018-01-26 DIAGNOSIS — Z954 Presence of other heart-valve replacement: Secondary | ICD-10-CM | POA: Diagnosis not present

## 2018-01-27 ENCOUNTER — Ambulatory Visit (INDEPENDENT_AMBULATORY_CARE_PROVIDER_SITE_OTHER): Payer: Managed Care, Other (non HMO) | Admitting: *Deleted

## 2018-01-27 DIAGNOSIS — Z954 Presence of other heart-valve replacement: Secondary | ICD-10-CM | POA: Diagnosis not present

## 2018-01-27 DIAGNOSIS — Q230 Congenital stenosis of aortic valve: Secondary | ICD-10-CM | POA: Diagnosis not present

## 2018-01-27 DIAGNOSIS — Q231 Congenital insufficiency of aortic valve: Secondary | ICD-10-CM

## 2018-01-27 DIAGNOSIS — Z7901 Long term (current) use of anticoagulants: Secondary | ICD-10-CM

## 2018-01-27 LAB — POCT INR: INR: 2.9

## 2018-01-27 NOTE — Patient Instructions (Signed)
Description   Continue taking 1 tablet daily except 1.5 tablets on Sundays, Tuesdays and Thursdays.  Recheck in one week. Coumadin Clinic [phone] Main [phone]

## 2018-01-28 ENCOUNTER — Encounter (HOSPITAL_COMMUNITY)
Admission: RE | Admit: 2018-01-28 | Discharge: 2018-01-28 | Disposition: A | Discharge status: Home / Self Care | Payer: Managed Care, Other (non HMO) | Source: Ambulatory Visit | Attending: Cardiology | Admitting: Cardiology

## 2018-01-28 DIAGNOSIS — Z954 Presence of other heart-valve replacement: Secondary | ICD-10-CM | POA: Insufficient documentation

## 2018-01-28 DIAGNOSIS — Z952 Presence of prosthetic heart valve: Secondary | ICD-10-CM

## 2018-01-31 ENCOUNTER — Encounter (HOSPITAL_COMMUNITY)
Admission: RE | Admit: 2018-01-31 | Discharge: 2018-01-31 | Disposition: A | Discharge status: Home / Self Care | Payer: Managed Care, Other (non HMO) | Source: Ambulatory Visit | Attending: Cardiology | Admitting: Cardiology

## 2018-01-31 ENCOUNTER — Encounter: Payer: Self-pay | Admitting: Cardiology

## 2018-01-31 DIAGNOSIS — Z952 Presence of prosthetic heart valve: Secondary | ICD-10-CM

## 2018-01-31 DIAGNOSIS — Z954 Presence of other heart-valve replacement: Secondary | ICD-10-CM | POA: Diagnosis not present

## 2018-02-01 ENCOUNTER — Ambulatory Visit (INDEPENDENT_AMBULATORY_CARE_PROVIDER_SITE_OTHER)
Admission: RE | Admit: 2018-02-01 | Discharge: 2018-02-01 | Disposition: A | Discharge status: Home / Self Care | Payer: Managed Care, Other (non HMO) | Source: Ambulatory Visit | Attending: Internal Medicine | Admitting: Internal Medicine

## 2018-02-01 ENCOUNTER — Encounter: Payer: Self-pay | Admitting: Internal Medicine

## 2018-02-01 ENCOUNTER — Ambulatory Visit: Payer: Managed Care, Other (non HMO) | Admitting: Internal Medicine

## 2018-02-01 VITALS — BP 130/80 | HR 89 | Temp 97.9°F | Resp 16 | Ht 60.0 in | Wt 126.0 lb

## 2018-02-01 DIAGNOSIS — J988 Other specified respiratory disorders: Secondary | ICD-10-CM | POA: Insufficient documentation

## 2018-02-01 DIAGNOSIS — R05 Cough: Secondary | ICD-10-CM | POA: Diagnosis not present

## 2018-02-01 DIAGNOSIS — R059 Cough, unspecified: Secondary | ICD-10-CM

## 2018-02-01 DIAGNOSIS — J0100 Acute maxillary sinusitis, unspecified: Secondary | ICD-10-CM | POA: Diagnosis not present

## 2018-02-01 DIAGNOSIS — J22 Unspecified acute lower respiratory infection: Secondary | ICD-10-CM | POA: Insufficient documentation

## 2018-02-01 MED ORDER — AMOXICILLIN-POT CLAVULANATE 875-125 MG PO TABS: 1.0000 | ORAL_TABLET | Freq: Two times a day (BID) | ORAL | 0 refills | Status: AC

## 2018-02-01 MED ORDER — PROMETHAZINE-DM 6.25-15 MG/5ML PO SYRP: 5.0000 mL | ORAL_SOLUTION | Freq: Four times a day (QID) | ORAL | 0 refills | Status: DC | PRN

## 2018-02-01 NOTE — Progress Notes (Signed)
Subjective:  Patient ID: Danielle Obrien, female    DOB: 1981-07-14  Age: 37 y.o. MRN: [id number]  CC: Cough   HPI [name] presents for a 2-week history of cough that is productive of dark yellow and brown phlegm with facial pain and runny nose that is productive of similar phlegm.  She complains of shortness of breath, chills, and says that her chest burns when she coughs.  She denies fever, hemoptysis, or night sweats.  Outpatient Medications Prior to Visit  Medication Sig Dispense Refill  . albuterol (PROVENTIL HFA;VENTOLIN HFA) 108 (90 Base) MCG/ACT inhaler Inhale 2 puffs into the lungs every 6 (six) hours as needed for wheezing or shortness of breath. 1 Inhaler 0  . Ascorbic Acid (VITAMIN C PO) Take 1 tablet by mouth daily.    Marland Kitchen aspirin EC 81 MG tablet Take 1 tablet (81 mg total) by mouth daily. 90 tablet 3  . Calcium Carbonate-Vitamin D (CALCIUM 600/VITAMIN D PO) Take 1 tablet by mouth daily.    Marland Kitchen gabapentin (NEURONTIN) 300 MG capsule TAKE 2 CAPSULES BY MOUTH THREE TIMES A DAY 360 capsule 1  . lamoTRIgine (LAMICTAL) 200 MG tablet TAKE 2 TABLETS (400 MG TOTAL) BY MOUTH DAILY. (Patient taking differently: Take 400 mg by mouth daily. ) 180 tablet 2  . lansoprazole (PREVACID) 15 MG capsule Take 15 mg by mouth daily as needed (for heartburn or acid reflux).     Marland Kitchen loratadine (LORADAMED) 10 MG tablet Take 10 mg by mouth daily.    . metoprolol tartrate (LOPRESSOR) 25 MG tablet Take 0.5 tablets (12.5 mg total) by mouth 2 (two) times daily. 60 tablet 1  . Tetrahydrozoline HCl (VISINE OP) Place 1 drop into both eyes at bedtime as needed (for allergies).    . traMADol (ULTRAM) 50 MG tablet Take 2 tablets (100 mg total) by mouth as needed. In addition to current dosage. Take at [facility] daily as needed for pain 10 tablet 0  . warfarin (COUMADIN) 7.5 MG tablet Take 1 tablet (7.5 mg total) by mouth daily at 6 PM. 35 tablet 2   No facility-administered medications prior to visit.      ROS Review of Systems  Constitutional: Positive for chills. Negative for diaphoresis, fatigue and fever.  HENT: Positive for rhinorrhea, sinus pressure and sinus pain. Negative for postnasal drip, sore throat and trouble swallowing.   Respiratory: Positive for cough and shortness of breath.   Cardiovascular: Negative for chest pain, palpitations and leg swelling.  Gastrointestinal: Negative for abdominal pain, constipation, nausea and vomiting.  Endocrine: Negative.   Genitourinary: Negative.   Musculoskeletal: Negative.   Skin: Negative.  Negative for pallor.  Allergic/Immunologic: Negative.   Neurological: Negative.  Negative for dizziness and weakness.  Hematological: Negative for adenopathy. Does not bruise/bleed easily.  Psychiatric/Behavioral: Negative.     Objective:  BP 130/80 (BP Location: Left Arm, Patient Position: Sitting, Cuff Size: Normal)   Pulse 89   Temp 97.9 F (36.6 C) (Oral)   Ht 5' (1.524 m)   Wt 126 lb (57.2 kg)   SpO2 98%   BMI 24.61 kg/m   BP Readings from Last 3 Encounters:  02/01/18 130/80  01/25/18 135/90  01/20/18 118/72    Wt Readings from Last 3 Encounters:  02/01/18 126 lb (57.2 kg)  01/25/18 125 lb (56.7 kg)  01/20/18 124 lb 9.6 oz (56.5 kg)    Physical Exam  Constitutional: She is oriented to person, place, and time.  Non-toxic appearance. She does  not have a sickly appearance. She does not appear ill. No distress.  HENT:  Mouth/Throat: Oropharynx is clear and moist. No oropharyngeal exudate.  Eyes: Conjunctivae are normal. Left eye exhibits no discharge. No scleral icterus.  Neck: Normal range of motion. Neck supple. No JVD present. No thyromegaly present.  Cardiovascular: Normal rate, regular rhythm and normal heart sounds. Exam reveals no gallop.  No murmur heard. Pulmonary/Chest: Effort normal and breath sounds normal. No accessory muscle usage. No respiratory distress. She has no decreased breath sounds. She has no wheezes.  She has no rhonchi. She has no rales.  Abdominal: Soft. Bowel sounds are normal. She exhibits no distension and no mass. There is no tenderness. There is no guarding.  Musculoskeletal: Normal range of motion. She exhibits no edema, tenderness or deformity.  Lymphadenopathy:    She has no cervical adenopathy.  Neurological: She is alert and oriented to person, place, and time.  Skin: Skin is dry. No rash noted. She is not diaphoretic. No erythema. No pallor.  Vitals reviewed.   Lab Results  Component Value Date   WBC 7.6 11/24/2017   HGB 12.2 11/24/2017   HCT 37.3 11/24/2017   PLT 356 11/24/2017   GLUCOSE 91 11/24/2017   CHOL 254 (H) 08/20/2016   TRIG 67.0 08/20/2016   HDL 79.00 08/20/2016   LDLDIRECT 171.0 01/27/2013   LDLCALC 162 (H) 08/20/2016   ALT 25 11/01/2017   AST 24 11/01/2017   NA 144 11/24/2017   K 4.9 11/24/2017   CL 102 11/24/2017   CREATININE 0.87 11/24/2017   BUN 15 11/24/2017   CO2 26 11/24/2017   TSH 0.64 01/14/2016   INR 2.9 01/27/2018   HGBA1C 5.4 11/01/2017    No results found.  Assessment & Plan:   Nijah was seen today for cough.  Diagnoses and all orders for this visit:  Cough- Her chest x-ray is negative for mass or infiltrate. -     DG Chest 2 View; Future -     promethazine-dextromethorphan (PROMETHAZINE-DM) 6.25-15 MG/5ML syrup; Take 5 mLs by mouth 4 (four) times daily as needed for up to 7 days for cough.  Acute non-recurrent maxillary sinusitis -     amoxicillin-clavulanate (AUGMENTIN) 875-125 MG tablet; Take 1 tablet by mouth 2 (two) times daily for 10 days.  RTI (respiratory tract infection)- I will treat the infection with Augmentin.  Will control the cough with Phenergan DM. -     amoxicillin-clavulanate (AUGMENTIN) 875-125 MG tablet; Take 1 tablet by mouth 2 (two) times daily for 10 days. -     promethazine-dextromethorphan (PROMETHAZINE-DM) 6.25-15 MG/5ML syrup; Take 5 mLs by mouth 4 (four) times daily as needed for up to 7 days  for cough.   I am having Elianna L. Keeran start on amoxicillin-clavulanate and promethazine-dextromethorphan. I am also having her maintain her lansoprazole, albuterol, lamoTRIgine, Calcium Carbonate-Vitamin D (CALCIUM 600/VITAMIN D PO), Tetrahydrozoline HCl (VISINE OP), Ascorbic Acid (VITAMIN C PO), metoprolol tartrate, loratadine, gabapentin, warfarin, aspirin EC, and traMADol.  Meds ordered this encounter  Medications  . amoxicillin-clavulanate (AUGMENTIN) 875-125 MG tablet    Sig: Take 1 tablet by mouth 2 (two) times daily for 10 days.    Dispense:  20 tablet    Refill:  0  . promethazine-dextromethorphan (PROMETHAZINE-DM) 6.25-15 MG/5ML syrup    Sig: Take 5 mLs by mouth 4 (four) times daily as needed for up to 7 days for cough.    Dispense:  118 mL    Refill:  0  Follow-up: No Follow-up on file.  [name], MD

## 2018-02-01 NOTE — Patient Instructions (Signed)
Cough, Adult  Coughing is a reflex that clears your throat and your airways. Coughing helps to heal and protect your lungs. It is normal to cough occasionally, but a cough that happens with other symptoms or lasts a long time may be a sign of a condition that needs treatment. A cough may last only 2-3 weeks (acute), or it may last longer than 8 weeks (chronic).  What are the causes?  Coughing is commonly caused by:   Breathing in substances that irritate your lungs.   A viral or bacterial respiratory infection.   Allergies.   Asthma.   Postnasal drip.   Smoking.   Acid backing up from the stomach into the esophagus (gastroesophageal reflux).   Certain medicines.   Chronic lung problems, including COPD (or rarely, lung cancer).   Other medical conditions such as heart failure.    Follow these instructions at home:  Pay attention to any changes in your symptoms. Take these actions to help with your discomfort:   Take medicines only as told by your health care provider.  ? If you were prescribed an antibiotic medicine, take it as told by your health care provider. Do not stop taking the antibiotic even if you start to feel better.  ? Talk with your health care provider before you take a cough suppressant medicine.   Drink enough fluid to keep your urine clear or pale yellow.   If the air is dry, use a cold steam vaporizer or humidifier in your bedroom or your home to help loosen secretions.   Avoid anything that causes you to cough at work or at home.   If your cough is worse at night, try sleeping in a semi-upright position.   Avoid cigarette smoke. If you smoke, quit smoking. If you need help quitting, ask your health care provider.   Avoid caffeine.   Avoid alcohol.   Rest as needed.    Contact a health care provider if:   You have new symptoms.   You cough up pus.   Your cough does not get better after 2-3 weeks, or your cough gets worse.   You cannot control your cough with suppressant  medicines and you are losing sleep.   You develop pain that is getting worse or pain that is not controlled with pain medicines.   You have a fever.   You have unexplained weight loss.   You have night sweats.  Get help right away if:   You cough up blood.   You have difficulty breathing.   Your heartbeat is very fast.  This information is not intended to replace advice given to you by your health care provider. Make sure you discuss any questions you have with your health care provider.  Document Released: 05/15/2011 Document Revised: 04/23/2016 Document Reviewed: 01/23/2015  Elsevier Interactive Patient Education  2018 Elsevier Inc.

## 2018-02-02 ENCOUNTER — Encounter (HOSPITAL_COMMUNITY): Payer: Managed Care, Other (non HMO)

## 2018-02-03 ENCOUNTER — Ambulatory Visit (INDEPENDENT_AMBULATORY_CARE_PROVIDER_SITE_OTHER): Payer: Managed Care, Other (non HMO) | Admitting: *Deleted

## 2018-02-03 DIAGNOSIS — Z7901 Long term (current) use of anticoagulants: Secondary | ICD-10-CM

## 2018-02-03 DIAGNOSIS — Q231 Congenital insufficiency of aortic valve: Secondary | ICD-10-CM | POA: Diagnosis not present

## 2018-02-03 DIAGNOSIS — Z954 Presence of other heart-valve replacement: Secondary | ICD-10-CM | POA: Diagnosis not present

## 2018-02-03 DIAGNOSIS — Q230 Congenital stenosis of aortic valve: Secondary | ICD-10-CM | POA: Diagnosis not present

## 2018-02-03 LAB — POCT INR: INR: 2.6

## 2018-02-03 NOTE — Patient Instructions (Signed)
Description   Continue taking 1 tablet daily except 1.5 tablets on Sundays, Tuesdays and Thursdays.  Recheck in 3 weeks.  [facility] [phone] Main [phone]

## 2018-02-04 ENCOUNTER — Telehealth (HOSPITAL_COMMUNITY): Payer: Self-pay | Admitting: Cardiac Rehabilitation

## 2018-02-04 ENCOUNTER — Telehealth (HOSPITAL_COMMUNITY): Payer: Self-pay | Admitting: Internal Medicine

## 2018-02-04 ENCOUNTER — Encounter (HOSPITAL_COMMUNITY): Payer: Managed Care, Other (non HMO)

## 2018-02-04 NOTE — Telephone Encounter (Signed)
Return pc to pt toi discuss absence from cardiac rehab. Pt with pneumonia. No answer,   LMOM.  Andi Hence, RN, BSN Cardiac Pulmonary Rehab 02/04/18 5:07 PM

## 2018-02-07 ENCOUNTER — Encounter (HOSPITAL_COMMUNITY)
Admission: RE | Admit: 2018-02-07 | Discharge: 2018-02-07 | Disposition: A | Discharge status: Home / Self Care | Payer: Managed Care, Other (non HMO) | Source: Ambulatory Visit | Attending: Cardiology | Admitting: Cardiology

## 2018-02-07 NOTE — Progress Notes (Signed)
OUTPATIENT CARDIAC REHAB         PMH:  S/P AVR  Primary Cardiologist:  Dr. Radford Pax   Pt arrived at cardiac rehab however exercise was deferred due to pt respiratory congestion.  Pt advised to hold cardiac rehab today and return at next scheduled class unless symptoms unimproved or worsen.  Pt tearful that URI has created set back in her recovery.  Pt offered emotional support and reassurance.  Pt advised to continue light activity at home while recovering and resume CR soon.  Pt has cardiology appt tomorrow and she will discuss return to work plans.  Pt verbalized understanding.  Andi Hence, RN, BSN Cardiac Pulmonary Rehab 02/07/18 10:27 AM

## 2018-02-08 ENCOUNTER — Other Ambulatory Visit: Payer: Self-pay | Admitting: Internal Medicine

## 2018-02-08 ENCOUNTER — Ambulatory Visit: Payer: Managed Care, Other (non HMO) | Admitting: Cardiology

## 2018-02-08 ENCOUNTER — Encounter: Payer: Self-pay | Admitting: Cardiology

## 2018-02-08 ENCOUNTER — Telehealth: Payer: Self-pay | Admitting: Internal Medicine

## 2018-02-08 VITALS — BP 120/80 | HR 82 | Ht 60.0 in | Wt 125.0 lb

## 2018-02-08 DIAGNOSIS — Z720 Tobacco use: Secondary | ICD-10-CM

## 2018-02-08 DIAGNOSIS — J209 Acute bronchitis, unspecified: Secondary | ICD-10-CM

## 2018-02-08 DIAGNOSIS — Z952 Presence of prosthetic heart valve: Secondary | ICD-10-CM | POA: Diagnosis not present

## 2018-02-08 DIAGNOSIS — J440 Chronic obstructive pulmonary disease with acute lower respiratory infection: Principal | ICD-10-CM

## 2018-02-08 MED ORDER — ALBUTEROL SULFATE HFA 108 (90 BASE) MCG/ACT IN AERS: 2.0000 | INHALATION_SPRAY | Freq: Four times a day (QID) | RESPIRATORY_TRACT | 3 refills | Status: DC | PRN

## 2018-02-08 MED ORDER — PROMETHAZINE-DM 6.25-15 MG/5ML PO SYRP: 5.0000 mL | ORAL_SOLUTION | Freq: Three times a day (TID) | ORAL | 0 refills | Status: DC | PRN

## 2018-02-08 NOTE — Progress Notes (Signed)
02/08/2018 Danielle Obrien   1981/09/13  [id number]  Primary Physician Janith Lima, MD Primary Cardiologist: Dr. Radford Pax   Reason for Visit/CC: S/p Mechanical Aortic Valve Clinic   HPI:  Danielle Obrien is a 37 y.o. female with a hx of bicuspid AV with severe AS, bipolar disorder, asthma and arthritis. She recently underwent minimally invasive AVR with a mechanical valve via right anterior mini thoracotomy on 11/03/2017 by Dr. Roxy Manns after coronary CT showed no CAD. She is now on Coumadin for her mechanical valve.   She was seen by Dr. Radford Pax 01/20/18 for f/u and noted significant incisional pain. Dr. Radford Pax wrote a limited Rx for Tramadol until pt could be seen back in clinic by Dr. Roxy Manns. In addition, pt voiced concerns about returning to work at [employer] too early. Pt had only had a few weeks of cardiac rehab and still felt too weak to return. Pt is required to do a lot of heavy lifting on the job. Dr. Radford Pax wrote her a work note to be out for an additional 2 weeks and wanted pt to f/u again in clinic for repeat assessment. Dr. Radford Pax noted to consider return to work with reduced hrs in the first 2 weeks back if still not 100%.   Pt is back in clinic today. She has a nasty cough. Productive with yellow suptum She was seen by her PCP on 02/01/18 and was felt to have an URI + maxillary sinusitis. She was prescribed Augmentin x 10 days + promethazine-dextromethorphan, PRN, for cough. Pt notes that because of her URI, she missed another week of cardiac rehab. She still feels week. Not at 100%. Still with cough and plans to f/u back up with her PCP. She denies fever and chills.   BP is well controlled at 120/80. She continues to have incisional chest pain but no anginal symptoms and no dyspnea.    Current Meds  Medication Sig  . amoxicillin-clavulanate (AUGMENTIN) 875-125 MG tablet Take 1 tablet by mouth 2 (two) times daily for 10 days.  Marland Kitchen aspirin EC 81 MG tablet Take 1 tablet (81 mg total)  by mouth daily.  . Calcium Carbonate-Vitamin D (CALCIUM 600/VITAMIN D PO) Take 1 tablet by mouth daily.  Marland Kitchen gabapentin (NEURONTIN) 300 MG capsule TAKE 2 CAPSULES BY MOUTH THREE TIMES A DAY  . lamoTRIgine (LAMICTAL) 200 MG tablet TAKE 2 TABLETS (400 MG TOTAL) BY MOUTH DAILY. (Patient taking differently: Take 400 mg by mouth daily. )  . lansoprazole (PREVACID) 15 MG capsule Take 15 mg by mouth daily as needed (for heartburn or acid reflux).   Marland Kitchen loratadine (LORADAMED) 10 MG tablet Take 10 mg by mouth daily.  . metoprolol tartrate (LOPRESSOR) 25 MG tablet Take 0.5 tablets (12.5 mg total) by mouth 2 (two) times daily.  . Tetrahydrozoline HCl (VISINE OP) Place 1 drop into both eyes at bedtime as needed (for allergies).  . traMADol (ULTRAM) 50 MG tablet Take 2 tablets (100 mg total) by mouth as needed. In addition to current dosage. Take at [facility] daily as needed for pain  . warfarin (COUMADIN) 7.5 MG tablet Take 1 tablet (7.5 mg total) by mouth daily at 6 PM.   Allergies  Allergen Reactions  . Demerol Nausea And Vomiting  . Ibuprofen Other (See Comments)    Gastritis   . Codeine Itching   Past Medical History:  Diagnosis Date  . Abnormal Pap smear of cervix    --recurrent ascus w/Pos. HR HPV  . ADD (  attention deficit disorder)   . Alcohol abuse, in remission 2012  . Allergy   . Anxiety   . Aortic stenosis   . Aortic stenosis due to bicuspid aortic valve 02/20/2014  . Arthritis    In hips  . Asthma    rare inhaler use  . Bicuspid aortic valve    moderate AS by echo 08/2015 with mean AVG 34mmHg and AVA 1.1cm2  . Bipolar disorder ([place])   . Headache   . Hyperlipidemia with target LDL less than 130 01/27/2013  . Hypertension   . Muscle spasms of both lower extremities    both hips  . 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 Tobacco abuse  09/27/2015  . 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 Hyperlipidemia Mother   . Hypertension Mother   . Thyroid disease Mother   . Hyperlipidemia Father   . Hypertension Father   . Migraines Father   . Hypertension Brother   . Hyperlipidemia Brother   . Thyroid disease Maternal Grandmother   . Thyroid disease Maternal Grandfather   . Cancer Neg Hx    Past Surgical History:  Procedure Laterality Date  . ABDOMINAL HYSTERECTOMY  02/06/2009   Robotic total laparoscopic hysterectomy  . ANTERIOR CRUCIATE LIGAMENT REPAIR  1993  . AORTIC VALVE REPLACEMENT N/A 11/03/2017   Procedure: MINIMALLY INVASIVE AORTIC VALVE REPLACEMENT( MINI THORACOTOMY);  Surgeon: Rexene Alberts, MD;  Location: [facility];  Service: Open Heart Surgery;  Laterality: N/A;  . CARDIAC CATHETERIZATION  June 2015   no CAD - moderate AS noted  . 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 COLPOSCOPY  10/2008   CIN I & II  . COLPOSCOPY  07/2000   Neg. ECC  . COLPOSCOPY  06/2001   CIN I  . COLPOSCOPY  08/2004   ECC--atypia  . COLPOSCOPY N/A 01/21/2016   Procedure: COLPOSCOPY with vaginal biopsy with CO 2 Laser of Vaginal and vulvar condyloma;  Surgeon: [name], MD;  Location: [facility];  Service: Gynecology;  Laterality: N/A;  [name] will be here 2/21 for 1115 case confirmed 01/16/15 - TS  . HERNIA REPAIR  1981/1982  . HIP SURGERY  1981   Hip Reset   . HIP SURGERY  1990   Plate was reconstructed/ took out growth plate in Right knee  . HIP SURGERY  1992   Plate removed in Left hip  . LEFT AND RIGHT HEART CATHETERIZATION WITH CORONARY ANGIOGRAM N/A 05/18/2014   Procedure: LEFT AND RIGHT HEART CATHETERIZATION WITH CORONARY ANGIOGRAM;  Surgeon: Jettie Booze, MD;  Location: [facility];  Service: Cardiovascular;  Laterality: N/A;  . LYMPH NODE BIOPSY  1995  .  NASAL SEPTUM SURGERY  2002  . TEE WITHOUT CARDIOVERSION N/A 11/03/2017   Procedure: TRANSESOPHAGEAL ECHOCARDIOGRAM (TEE);  Surgeon: Rexene Alberts, MD;  Location: [place];  Service: Open Heart Surgery;  Laterality: N/A;  . TONSILLECTOMY AND ADENOIDECTOMY  1990  . TYMPANOSTOMY TUBE PLACEMENT  1981/1982   Social History   Socioeconomic History  . Marital status: Single    Spouse name: Not on file  . Number of children: Not on file  . Years of education:  Not on file  . Highest education level: Not on file  Social Needs  . Financial resource strain: Not on file  . Food insecurity - worry: Not on file  . Food insecurity - inability: Not on file  . Transportation needs - medical: Not on file  . Transportation needs - non-medical: Not on file  Occupational History  . Not on file  Tobacco Use  . Smoking status: Former Smoker    Packs/day: 0.50    Years: 12.00    Pack years: 6.00    Types: Cigarettes    Last attempt to quit: 11/02/2017    Years since quitting: 0.2  . Smokeless tobacco: Never Used  . Tobacco comment: pt given fake cigarette for hand/mouth association. pt using [facility] for support  Substance and Sexual Activity  . Alcohol use: No    Alcohol/week: 0.0 oz  . Drug use: No  . Sexual activity: Yes    Partners: Male    Birth control/protection: Surgical    Comment: Hysterectomy  Other Topics Concern  . Not on file  Social History Narrative  . Not on file     Review of Systems: General: negative for chills, fever, night sweats or weight changes.  Cardiovascular: negative for chest pain, dyspnea on exertion, edema, orthopnea, palpitations, paroxysmal nocturnal dyspnea or shortness of breath Dermatological: negative for rash Respiratory: negative for cough or wheezing Urologic: negative for hematuria Abdominal: negative for nausea, vomiting, diarrhea, bright red blood per rectum, melena, or hematemesis Neurologic: negative for visual changes, syncope, or  dizziness All other systems reviewed and are otherwise negative except as noted above.   Physical Exam:  Blood pressure 120/80, pulse 82, height 5' (1.524 m), weight 125 lb (56.7 kg), SpO2 97 %.  General appearance: alert, cooperative and no distress Neck: no carotid bruit and no JVD Lungs: clear to auscultation bilaterally Heart: regular rate and rhythm, S1, S2 normal, no murmur, click, rub or gallop Extremities: extremities normal, atraumatic, no cyanosis or edema Pulses: 2+ and symmetric Skin: Skin color, texture, turgor normal. No rashes or lesions Neurologic: Grossly normal  EKG not performed -- personally reviewed   ASSESSMENT AND PLAN:   1. [place] Aortic Valve: s/p recent AVR for severe bicuspid valve aortic stenosis. Slow progression to full strength. Pt missed a week of cardiac rehab 2/2 URI. We will write work note to keep out of work for another 2 weeks with plans to get back into cardiac rehab for therapy. Plan to return back to work in 2 weeks on reduced work hours, no more than 6 hr days. Pt feels this is reasonable. She will continue to go to cardiac rehab and will call if any issues. Continue coumadin for mechanical valve. Keep w/u with Dr. Radford Pax in May.    Maeve Debord Ladoris Gene, MHS [facility] 02/08/2018 9:20 AM

## 2018-02-08 NOTE — Progress Notes (Signed)
Work release letter

## 2018-02-08 NOTE — Patient Instructions (Signed)
Medication Instructions:  Your physician recommends that you continue on your current medications as directed. Please refer to the Current Medication list given to you today.  If you need a refill on your cardiac medications, please contact your pharmacy first.  Labwork: None ordered   Testing/Procedures: None ordered   Follow-Up: Please keep follow up appointment with Dr. Radford Pax as scheduled.   Any Other Special Instructions Will Be Listed Below (If Applicable).   Thank you for choosing [place], RN  [phone]  If you need a refill on your cardiac medications before your next appointment, please call your pharmacy.

## 2018-02-08 NOTE — Telephone Encounter (Signed)
Patient was seen 02/01/18- and diagnosed with URI. Patient was in to see her cardiologist today and they have postponed her release for 2 weeks until she is well. ( over the cough) Patient is requesting a refill of cough medication and her inhaler- Albuterol. She is to go back to rehab tomorrow and wants to make sure she can tolerate it.Patient states she is still on antibiotic. Refill request: Albuterol and promethazine-dextromethorphan  LOV: 02/01/18  PCP: [place]: verified

## 2018-02-08 NOTE — Telephone Encounter (Signed)
Copied from [place]. Topic: Quick Communication - Rx Refill/Question >> Feb 08, 2018 12:03 PM Bea Graff, NT wrote: Medication: promethazine-dextromethorphan (PROMETHAZINE-DM)   Has the patient contacted their pharmacy? Yes.     (Agent: If no, request that the patient contact the pharmacy for the refill.)   Preferred Pharmacy (with phone number or street name): [facility] [address]. Pt states she is still coughing   Agent: Please be advised that RX refills may take up to 3 business days. We ask that you follow-up with your pharmacy.

## 2018-02-09 ENCOUNTER — Encounter (HOSPITAL_COMMUNITY): Payer: Managed Care, Other (non HMO)

## 2018-02-09 NOTE — Telephone Encounter (Signed)
Called pt no answer LMOM MD sent refills to [facility] yesterday.Marland KitchenJohny Chess

## 2018-02-11 ENCOUNTER — Encounter (HOSPITAL_COMMUNITY)
Admission: RE | Admit: 2018-02-11 | Discharge: 2018-02-11 | Disposition: A | Discharge status: Home / Self Care | Payer: Managed Care, Other (non HMO) | Source: Ambulatory Visit | Attending: Cardiology | Admitting: Cardiology

## 2018-02-11 DIAGNOSIS — Z954 Presence of other heart-valve replacement: Secondary | ICD-10-CM | POA: Diagnosis not present

## 2018-02-11 DIAGNOSIS — Z952 Presence of prosthetic heart valve: Secondary | ICD-10-CM

## 2018-02-11 NOTE — Progress Notes (Signed)
Daily Session Note  Patient Details  Name: Danielle Obrien MRN: [id number] Date of Birth: 08/18/1981 Referring Provider:     CARDIAC REHAB PHASE II ORIENTATION from 12/23/2017 in [facility]  Referring Provider  Turner, Traci MD      Encounter Date: 02/11/2018  Check In: Session Check In - 02/11/18 0858      Check-In   Location  [facility]    Staff Present  Joann Rion, RN, BSN;Amber Fair, MS, ACSM RCEP, Exercise Physiologist;Tyara Nevels, MS,ACSM CEP, Exercise Physiologist;Other    Supervising physician immediately available to respond to emergencies  [facility] immediately available    Physician(s)  Dr. Vega     Medication changes reported      No    Fall or balance concerns reported     No    Tobacco Cessation  No Change    Warm-up and Cool-down  Performed as group-led instruction    Resistance Training Performed  Yes    VAD Patient?  No      Pain Assessment   Currently in Pain?  No/denies    Multiple Pain Sites  No       Capillary Blood Glucose: No results found for this or any previous visit (from the past 24 hour(s)).    Social History   Tobacco Use  Smoking Status Former Smoker  . Packs/day: 0.50  . Years: 12.00  . Pack years: 6.00  . Types: Cigarettes  . Last attempt to quit: 11/02/2017  . Years since quitting: 0.2  Smokeless Tobacco Never Used  Tobacco Comment   pt given fake cigarette for hand/mouth association. pt using [employer] for support    Goals Met:  Strength training completed today  Goals Unmet:  dizziness  Comments: pt c/o dizziness post exercise. Pt recovering from URI treated with amoxicillin.  BP:  140/85           HR:    80     Lying  BP:  134/81  HR:   85     Sitting  BP:  119/83   HR:   84     Standing   No change in symptoms with position change.  Pt given gatorade.  Pt also ate protein bar as she skipped breakfast.  Pt symptoms relieved within a few minutes of  rest.  Repeat BP:  103/3 HR: 83 . Pt ambulated on track 600feet.  Recheck BP:  106/76  HR:85,  Pt asymptomatic and reports complete resolution of dizziness.   Pt states she has been holding 1 amoxicillin dose daily due to vaginal yeast symptoms. Pt instructed to take amoxicillin as directed and notify prescribing MD of side effects. Pt also instructed to eat yogurt and take probiotics.  Pt verbalized understanding.   Joann Rion, RN, BSN Cardiac Pulmonary Rehab 02/11/18  10:15 AM   Dr. Traci Turner is Medical Director for Cardiac Rehab at [facility]. 

## 2018-02-14 ENCOUNTER — Encounter (HOSPITAL_COMMUNITY): Payer: Self-pay

## 2018-02-14 ENCOUNTER — Encounter (HOSPITAL_COMMUNITY)
Admission: RE | Admit: 2018-02-14 | Discharge: 2018-02-14 | Disposition: A | Discharge status: Home / Self Care | Payer: Managed Care, Other (non HMO) | Source: Ambulatory Visit | Attending: Cardiology | Admitting: Cardiology

## 2018-02-14 DIAGNOSIS — Z954 Presence of other heart-valve replacement: Secondary | ICD-10-CM | POA: Diagnosis not present

## 2018-02-14 DIAGNOSIS — Z952 Presence of prosthetic heart valve: Secondary | ICD-10-CM

## 2018-02-16 ENCOUNTER — Encounter (HOSPITAL_COMMUNITY)
Admission: RE | Admit: 2018-02-16 | Discharge: 2018-02-16 | Disposition: A | Discharge status: Home / Self Care | Payer: Managed Care, Other (non HMO) | Source: Ambulatory Visit | Attending: Cardiology | Admitting: Cardiology

## 2018-02-16 DIAGNOSIS — Z952 Presence of prosthetic heart valve: Secondary | ICD-10-CM

## 2018-02-16 DIAGNOSIS — Z954 Presence of other heart-valve replacement: Secondary | ICD-10-CM | POA: Diagnosis not present

## 2018-02-17 NOTE — Progress Notes (Signed)
Cardiac Individual Treatment Plan  Patient Details  Name: Danielle Obrien MRN: [id number] Date of Birth: Apr 22, 1981 Referring Provider:   Flowsheet Row CARDIAC REHAB PHASE II ORIENTATION from 12/23/2017 in [facility]  Referring Provider  Fransico Him MD      Initial Encounter Date:  [place] from 12/23/2017 in [facility]  Date  12/23/17  Referring Provider  Fransico Him MD      Visit Diagnosis: S/P AVR (aortic valve replacement)  Patient's Home Medications on Admission:  Current Outpatient Medications:  .  albuterol (PROVENTIL HFA;VENTOLIN HFA) 108 (90 Base) MCG/ACT inhaler, Inhale 2 puffs into the lungs every 6 (six) hours as needed for wheezing or shortness of breath., Disp: 1 Inhaler, Rfl: 3 .  aspirin EC 81 MG tablet, Take 1 tablet (81 mg total) by mouth daily., Disp: 90 tablet, Rfl: 3 .  Calcium Carbonate-Vitamin D (CALCIUM 600/VITAMIN D PO), Take 1 tablet by mouth daily., Disp: , Rfl:  .  gabapentin (NEURONTIN) 300 MG capsule, TAKE 2 CAPSULES BY MOUTH THREE TIMES A DAY, Disp: 360 capsule, Rfl: 1 .  lamoTRIgine (LAMICTAL) 200 MG tablet, TAKE 2 TABLETS (400 MG TOTAL) BY MOUTH DAILY. (Patient taking differently: Take 400 mg by mouth daily. ), Disp: 180 tablet, Rfl: 2 .  lansoprazole (PREVACID) 15 MG capsule, Take 15 mg by mouth daily as needed (for heartburn or acid reflux). , Disp: , Rfl:  .  loratadine (LORADAMED) 10 MG tablet, Take 10 mg by mouth daily., Disp: , Rfl:  .  metoprolol tartrate (LOPRESSOR) 25 MG tablet, Take 0.5 tablets (12.5 mg total) by mouth 2 (two) times daily., Disp: 60 tablet, Rfl: 1 .  promethazine-dextromethorphan (PROMETHAZINE-DM) 6.25-15 MG/5ML syrup, Take 5 mLs by mouth 3 (three) times daily as needed for cough., Disp: 240 mL, Rfl: 0 .  Tetrahydrozoline HCl (VISINE OP), Place 1 drop into both eyes at bedtime as needed (for allergies)., Disp: , Rfl:  .   traMADol (ULTRAM) 50 MG tablet, Take 2 tablets (100 mg total) by mouth as needed. In addition to current dosage. Take at [facility] daily as needed for pain, Disp: 10 tablet, Rfl: 0 .  warfarin (COUMADIN) 7.5 MG tablet, Take 1 tablet (7.5 mg total) by mouth daily at 6 PM., Disp: 35 tablet, Rfl: 2  Past Medical History: Past Medical History:  Diagnosis Date  . Abnormal Pap smear of cervix    --recurrent ascus w/Pos. HR HPV  . ADD (attention deficit disorder)   . Alcohol abuse, in remission 2012  . Allergy   . Anxiety   . Aortic stenosis   . Aortic stenosis due to bicuspid aortic valve 02/20/2014  . Arthritis    In hips  . Asthma    rare inhaler use  . Bicuspid aortic valve    moderate AS by echo 08/2015 with mean AVG 71mmHg and AVA 1.1cm2  . Bipolar disorder ([place])   . Headache   . Hyperlipidemia with target LDL less than 130 01/27/2013  . Hypertension   . Muscle spasms of both lower extremities    both hips  . 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 Tobacco abuse 09/27/2015  . 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 Packs/day: 0.50  . Years: 12.00  . Pack years: 6.00  . Types: Cigarettes  . Last attempt to quit: 11/02/2017  . Years since quitting: 0.2  Smokeless Tobacco Never Used  Tobacco Comment   pt given fake cigarette for hand/mouth association. pt using [place] for support    Labs: Recent Review Flowsheet Data    Labs for ITP Cardiac and Pulmonary Rehab Latest Ref Rng & Units 11/03/2017 11/03/2017 11/03/2017 11/03/2017 11/04/2017   Cholestrol 0 - 200 mg/dL - - - - -   LDLCALC 0 - 99 mg/dL - - - - -   LDLDIRECT mg/dL - - - - -   HDL >39.00 mg/dL - - - - -   Trlycerides 0.0 - 149.0  mg/dL - - - - -   Hemoglobin A1c 4.8 - 5.6 % - - - - -   PHART 7.350 - 7.450 7.312(L) 7.305(L) 7.317(L) - 7.381   PCO2ART 32.0 - 48.0 mmHg 42.7 43.5 36.7 - 33.9   HCO3 20.0 - 28.0 mmol/L 21.6 21.9 18.7(L) - 19.7(L)   TCO2 22 - 32 mmol/L 23 23 20(L) 20(L) 27   ACIDBASEDEF 0.0 - 2.0 mmol/L 4.0(H) 5.0(H) 7.0(H) - 4.5(H)   O2SAT % 97.0 98.0 99.0 - 90.8      Capillary Blood Glucose: Lab Results  Component Value Date   GLUCAP 131 (H) 11/05/2017   GLUCAP 89 11/05/2017   GLUCAP 128 (H) 11/05/2017   GLUCAP 107 (H) 11/05/2017   GLUCAP 148 (H) 11/04/2017     Exercise Target Goals:    Exercise Program Goal: Individual exercise prescription set using results from initial 6 min walk test and THRR while considering  patient's activity barriers and safety.   Exercise Prescription Goal: Initial exercise prescription builds to 30-45 minutes a day of aerobic activity, 2-3 days per week.  Home exercise guidelines will be given to patient during program as part of exercise prescription that the participant will acknowledge.  Activity Barriers & Risk Stratification: Activity Barriers & Cardiac Risk Stratification - 12/23/17 1113    Activity Barriers & Cardiac Risk Stratification          Comments  leg length discrepency, R sided upper extremity limitations, L ACL surgery             6 Minute Walk: 6 Minute Walk    6 Minute Walk    Row Name 12/23/17 1117   Phase  Initial   Distance  1800 feet   Walk Time  6 minutes   # of Rest Breaks  0   MPH  3.4   METS  5.6   RPE  12   Perceived Dyspnea   1   VO2 Peak  19.53   Symptoms  Yes (comment)   Comments  mild SOB   Resting HR  81 bpm   Resting BP  104/60   Resting Oxygen Saturation   97 %   Exercise Oxygen Saturation  during 6 min walk  98 %   Max Ex. HR  99 bpm   Max Ex. BP  124/82   2 Minute Post BP  118/70          Oxygen Initial Assessment:   Oxygen Re-Evaluation:   Oxygen Discharge (Final Oxygen  Re-Evaluation):   Initial Exercise Prescription: Initial Exercise Prescription - 12/23/17 1100    Date of Initial Exercise RX and Referring Provider  Date  12/23/17    Referring Provider  Fransico Him MD        Treadmill          MPH  2.4    Grade  0    Minutes  10    METs  2.84        Bike          Level  0.5    Minutes  10    METs  2.72        NuStep          Level  3    SPM  80    Minutes  10    METs  2.4        Prescription Details          Frequency (times per week)  3    Duration  Progress to 30 minutes of continuous aerobic without signs/symptoms of physical distress        Intensity          THRR 40-80% of Max Heartrate  73-146    Ratings of Perceived Exertion  11-15    Perceived Dyspnea  0-4        Progression          Progression  Continue to progress workloads to maintain intensity without signs/symptoms of physical distress.        Resistance Training          Training Prescription  Yes    Weight  2lbs    Reps  10-15           Perform Capillary Blood Glucose checks as needed.  Exercise Prescription Changes: Exercise Prescription Changes    Response to Exercise    Row Name 12/29/17 1600 01/17/18 1200 01/31/18 1626 02/14/18 1500   Blood Pressure (Admit)  102/64  112/76  110/84  112/64   Blood Pressure (Exercise)  134/76  128/62  110/60  120/60   Blood Pressure (Exit)  104/60  106/68  98/64  114/70   Heart Rate (Admit)  93 bpm  88 bpm  88 bpm  77 bpm   Heart Rate (Exercise)  103 bpm  123 bpm  120 bpm  109 bpm   Heart Rate (Exit)  82 bpm  86 bpm  88 bpm  76 bpm   Rating of Perceived Exertion (Exercise)  12  12  12  12    Symptoms  none  none  none  none   Comments  pt oriented to exercise equipment 12/29/17  pt oriented to exercise equipment 12/29/17  pt uses weight cable column machine for weights as an exercise station  pt uses weight cable column machine for weights as an exercise station   Duration  Continue with 30 min of  aerobic exercise without signs/symptoms of physical distress.  Continue with 30 min of aerobic exercise without signs/symptoms of physical distress.  Continue with 30 min of aerobic exercise without signs/symptoms of physical distress.  Continue with 30 min of aerobic exercise without signs/symptoms of physical distress.   Intensity  THRR unchanged  THRR unchanged  THRR unchanged  THRR unchanged       Progression    Row Name 12/29/17 1600 01/17/18 1200 01/31/18 1626 02/14/18 1500   Progression  Continue to progress workloads to maintain intensity without signs/symptoms of physical distress.  Continue to progress workloads to maintain intensity without signs/symptoms of physical distress.  Continue to progress workloads to maintain intensity without signs/symptoms of physical  distress.  Continue to progress workloads to maintain intensity without signs/symptoms of physical distress.   Average METs  3.1  6.9  5.5  5.5       Resistance Training    Row Name 12/29/17 1600 01/17/18 1200 01/31/18 1626 02/14/18 1500   Training Prescription  No Relaxation day  No  Yes pt uses weight cable column machine for weights  Yes pt uses weight cable column machine for weights   Weight  no documentation  no documentation  30-40  40-50   Reps  no documentation  no documentation  10-15 2-3sets  10-15 2-3sets   Time  no documentation  no documentation  20 Minutes  20 Minutes       Treadmill    Row Name 12/29/17 1600 01/17/18 1200 01/31/18 1626 02/14/18 1500   MPH  2.4  3.3  3.4  3.4   Grade  0  3  4  4    Minutes  10  10  10  15    METs  2.84  4.77  5.48  5.48       Bike    Row Name 12/29/17 1600 01/17/18 1200 01/31/18 1626 02/14/18 1500   Level  2 upright scifit  4.5 upright scifit  no documentation  no documentation   Minutes  10  10  no documentation  no documentation   METs  4.1  7.1  no documentation  no documentation       [place] Name 12/29/17 1600 01/17/18 1200 01/31/18 1626 02/14/18 1500    Level  3  4  no documentation  no documentation   SPM  80  100  no documentation  no documentation   Minutes  10  10  no documentation  no documentation   METs  2.3  7.1  no documentation  no documentation       Home Exercise Plan    [place] Name 12/29/17 1600 01/17/18 1200 01/31/18 1626 02/14/18 1500   Plans to continue exercise at  no documentation  Home (comment) walking  Home (comment) walking  Home (comment) walking   Frequency  no documentation  Add 2 additional days to program exercise sessions.  Add 2 additional days to program exercise sessions.  Add 2 additional days to program exercise sessions.   Initial Home Exercises Provided  no documentation  01/14/18  01/14/18  01/14/18          Exercise Comments: Exercise Comments    Row Name 12/29/17 1623 01/13/18 1240 02/14/18 1501   Exercise Comments  Pt completed first session of cardiac rehab today, Pt responded well to exercise prescription. Pt denied symptoms of SOB, dizziness and CP.  Pt will continue to be monitored and exercise program will be progressed as tolerated.  Reviewed METs and goals. Pt is tolerating exercise very well; will continue to monitor activity levels and progress exercise as tolerated  Reviewed METs and goals. Pt is tolerating exercise very well; will continue to monitor activity levels and progress exercise as tolerated      Exercise Goals and Review: Exercise Goals    Exercise Goals    Row Name 12/23/17 1118   Increase Physical Activity  Yes   Intervention  Provide advice, education, support and counseling about physical activity/exercise needs.;Develop an individualized exercise prescription for aerobic and resistive training based on initial evaluation findings, risk stratification, comorbidities and participant's personal goals.   Expected Outcomes  Short Term: Attend rehab on a regular basis  to increase amount of physical activity.;Long Term: Add in home exercise to make exercise part of routine and to  increase amount of physical activity.;Long Term: Exercising regularly at least 3-5 days a week.   Increase Strength and Stamina  Yes   Intervention  Provide advice, education, support and counseling about physical activity/exercise needs.;Develop an individualized exercise prescription for aerobic and resistive training based on initial evaluation findings, risk stratification, comorbidities and participant's personal goals.   Expected Outcomes  Short Term: Perform resistance training exercises routinely during rehab and add in resistance training at home;Short Term: Increase workloads from initial exercise prescription for resistance, speed, and METs.;Long Term: Improve cardiorespiratory fitness, muscular endurance and strength as measured by increased METs and functional capacity (6MWT)   Able to understand and use rate of perceived exertion (RPE) scale  Yes   Intervention  Provide education and explanation on how to use RPE scale   Expected Outcomes  Short Term: Able to use RPE daily in rehab to express subjective intensity level;Long Term:  Able to use RPE to guide intensity level when exercising independently   Knowledge and understanding of Target Heart Rate Range (THRR)  Yes   Intervention  Provide education and explanation of THRR including how the numbers were predicted and where they are located for reference   Expected Outcomes  Short Term: Able to state/look up THRR;Short Term: Able to use daily as guideline for intensity in rehab;Long Term: Able to use THRR to govern intensity when exercising independently   Able to check pulse independently  Yes   Intervention  Provide education and demonstration on how to check pulse in carotid and radial arteries.;Review the importance of being able to check your own pulse for safety during independent exercise   Expected Outcomes  Short Term: Able to explain why pulse checking is important during independent exercise;Long Term: Able to check pulse  independently and accurately   Understanding of Exercise Prescription  Yes   Intervention  Provide education, explanation, and written materials on patient's individual exercise prescription   Expected Outcomes  Short Term: Able to explain program exercise prescription;Long Term: Able to explain home exercise prescription to exercise independently          Exercise Goals Re-Evaluation : Exercise Goals Re-Evaluation    Exercise Goal Re-Evaluation    Row Name 01/05/18 1106 01/13/18 1238 02/14/18 1501   Exercise Goals Review  Increase Physical Activity;Understanding of Exercise Prescription;Increase Strength and Stamina;Knowledge and understanding of Target Heart Rate Range (THRR);Able to understand and use rate of perceived exertion (RPE) scale;Able to check pulse independently  Increase Physical Activity;Understanding of Exercise Prescription;Increase Strength and Stamina;Knowledge and understanding of Target Heart Rate Range (THRR);Able to understand and use rate of perceived exertion (RPE) scale;Able to check pulse independently  Increase Physical Activity;Understanding of Exercise Prescription;Increase Strength and Stamina;Knowledge and understanding of Target Heart Rate Range (THRR);Able to understand and use rate of perceived exertion (RPE) scale;Able to check pulse independently   Comments  Reviewed home exercise with pt today.  Pt plans to walk for exercise, 2x/week in addition to coming to cardiac rehab.  Reviewed THR, pulse, RPE, sign and symptoms, and when to call 911 or MD.  Also discussed weather considerations and indoor options.  Pt voiced understanding.  Pt is walking at home for exercise for 15 minutes 2x/week. Discussed exercise progression( progress by one minute), temperature/emergency precautions.  Pt is very compliant with HEP in which she walks for 15 -20 minutes 2x/week. Pt has also increased  UE strength by using cable column weight machine in cardiac rehab. Pt has also increased  confidence with activity and exercise.   Expected Outcomes  Pt will continue to improve in cardiorespiratory fitness by coming to cardiac rehab and being compliant with HEP.  Pt will continue to improve in cardiorespiratory fitness by coming to cardiac rehab and being compliant with HEP.  Pt will continue to improve in cardiorespiratory fitness by coming to cardiac rehab and being compliant with HEP.           Discharge Exercise Prescription (Final Exercise Prescription Changes): Exercise Prescription Changes - 02/14/18 1500    Response to Exercise          Blood Pressure (Admit)  112/64    Blood Pressure (Exercise)  120/60    Blood Pressure (Exit)  114/70    Heart Rate (Admit)  77 bpm    Heart Rate (Exercise)  109 bpm    Heart Rate (Exit)  76 bpm    Rating of Perceived Exertion (Exercise)  12    Symptoms  none    Comments  pt uses weight cable column machine for weights as an exercise station    Duration  Continue with 30 min of aerobic exercise without signs/symptoms of physical distress.    Intensity  THRR unchanged        Progression          Progression  Continue to progress workloads to maintain intensity without signs/symptoms of physical distress.    Average METs  5.5        Resistance Training          Training Prescription  Yes pt uses weight cable column machine for weights    Weight  40-50    Reps  10-15 2-3sets    Time  20 Minutes        Treadmill          MPH  3.4    Grade  4    Minutes  15    METs  5.48        Home Exercise Plan          Plans to continue exercise at  Home (comment) walking    Frequency  Add 2 additional days to program exercise sessions.    Initial Home Exercises Provided  01/14/18           Nutrition:  Target Goals: Understanding of nutrition guidelines, daily intake of sodium 1500mg , cholesterol 200mg , calories 30% from fat and 7% or less from saturated fats, daily to have 5 or more servings of fruits and  vegetables.  Biometrics: Pre Biometrics - 12/23/17 1105    Pre Biometrics          Height  5' (1.524 m)    Weight  124 lb 9 oz (56.5 kg)    Waist Circumference  30.5 inches    Hip Circumference  34.5 inches    Waist to Hip Ratio  0.88 %    BMI (Calculated)  24.33    Triceps Skinfold  19 mm    % Body Fat  31.9 %    Grip Strength  28 kg    Flexibility  19 in    Single Leg Stand  19.21 seconds            Nutrition Therapy Plan and Nutrition Goals: Nutrition Therapy & Goals - 01/10/18 0945    Nutrition Therapy          Diet  Heart Healthy    Drug/Food Interactions  Coumadin/Vit K        Personal Nutrition Goals          Nutrition Goal  Wt loss of 1-2 lb/week to a wt loss goal of 5-7 lb at graduation from [place].     Personal Goal #2  Pt to describe the benefit of including fruits, vegetables, whole grains, and low-fat dairy products in a heart healthy meal plan.        Intervention Plan          Intervention  Prescribe, educate and counsel regarding individualized specific dietary modifications aiming towards targeted core components such as weight, hypertension, lipid management, diabetes, heart failure and other comorbidities.    Expected Outcomes  Short Term Goal: Understand basic principles of dietary content, such as calories, fat, sodium, cholesterol and nutrients.;Long Term Goal: Adherence to prescribed nutrition plan.           Nutrition Assessments: Nutrition Assessments - 01/10/18 0944    MEDFICTS Scores          Pre Score  36           Nutrition Goals Re-Evaluation:   Nutrition Goals Re-Evaluation:   Nutrition Goals Discharge (Final Nutrition Goals Re-Evaluation):   Psychosocial: Target Goals: Acknowledge presence or absence of significant depression and/or stress, maximize coping skills, provide positive support system. Participant is able to verbalize types and ability to use techniques and skills needed for reducing stress and  depression.  Initial Review & Psychosocial Screening: Initial Psych Review & Screening - 12/23/17 1229    Initial Review          Current issues with  History of Depression;Current Anxiety/Panic;Current Stress Concerns;Current Depression    Source of Stress Concerns  Unable to perform yard/household activities;Unable to participate in former interests or hobbies;Poor Coping Skills        Family Dynamics          Good Support System?  Yes Pt lives with mother and father during her recovery from surgery. Pt has bipolar and takes buspar and xanax        Barriers          Psychosocial barriers to participate in program  The patient should benefit from training in stress management and relaxation.        Screening Interventions          Interventions  Encouraged to exercise;Provide feedback about the scores to participant;To provide support and resources with identified psychosocial needs    Expected Outcomes  Short Term goal: Utilizing psychosocial counselor, staff and physician to assist with identification of specific Stressors or current issues interfering with healing process. Setting desired goal for each stressor or current issue identified.;Long Term Goal: Stressors or current issues are controlled or eliminated.;Short Term goal: Identification and review with participant of any Quality of Life or Depression concerns found by scoring the questionnaire.;Long Term goal: The participant improves quality of Life and PHQ9 Scores as seen by post scores and/or verbalization of changes           Quality of Life Scores: Quality of Life - 12/23/17 1132    Quality of Life Scores          Health/Function Pre  23.14 %    Socioeconomic Pre  23.07 %    Psych/Spiritual Pre  19.71 %    Family Pre  24 %    GLOBAL Pre  22.48 %  Scores of 19 and below usually indicate a poorer quality of life in these areas.  A difference of  2-3 points is a clinically meaningful difference.  A  difference of 2-3 points in the total score of the Quality of Life Index has been associated with significant improvement in overall quality of life, self-image, physical symptoms, and general health in studies assessing change in quality of life.  PHQ-9: Recent Review Flowsheet Data    Depression screen [employer] 2/9 12/29/2017 07/01/2017   Decreased Interest 0 2   Down, Depressed, Hopeless 1 2   PHQ - 2 Score 1 4   Altered sleeping - 1   Tired, decreased energy - 1   Change in appetite - 2   Feeling bad or failure about yourself  - 1   Trouble concentrating - 1   Moving slowly or fidgety/restless - 1   Suicidal thoughts - 0   PHQ-9 Score - 11   Difficult doing work/chores - Somewhat difficult     Interpretation of Total Score  Total Score Depression Severity:  1-4 = Minimal depression, 5-9 = Mild depression, 10-14 = Moderate depression, 15-19 = Moderately severe depression, 20-27 = Severe depression   Psychosocial Evaluation and Intervention: Psychosocial Evaluation - 12/29/17 0942    Psychosocial Evaluation & Interventions          Interventions  Encouraged to exercise with the program and follow exercise prescription;Stress management education;Relaxation education    Comments  pt with history of depression, current health related stress and anxiety concerned about inbility to perform activites as prior to surgery.      Expected Outcomes  pt will exhibit improved outlook with good coping skills.     Continue Psychosocial Services   Follow up required by staff           Psychosocial Re-Evaluation: Psychosocial Re-Evaluation    Psychosocial Re-Evaluation    [place] Name 01/12/18 1055 02/14/18 1509   Current issues with  None Identified;Current Anxiety/Panic  None Identified;Current Anxiety/Panic   Comments  pt with health related anxiety demonstrates new coping skills and stress management ideas.  pt anxiety improving.    pt with health related anxiety demonstrates new coping skills and  stress management ideas.  pt anxiety improving.  overall pt exhbits improved outlook.     Expected Outcomes  pt will exhibit good coping skills with positive outlook.   pt will exhibit good coping skills with positive outlook.    Interventions  Encouraged to attend Cardiac Rehabilitation for the exercise;Stress management education;Relaxation education  Encouraged to attend Cardiac Rehabilitation for the exercise;Stress management education;Relaxation education   Continue Psychosocial Services   Follow up required by staff  Follow up required by staff          Psychosocial Discharge (Final Psychosocial Re-Evaluation): Psychosocial Re-Evaluation - 02/14/18 1509    Psychosocial Re-Evaluation          Current issues with  None Identified;Current Anxiety/Panic    Comments  pt with health related anxiety demonstrates new coping skills and stress management ideas.  pt anxiety improving.  overall pt exhbits improved outlook.      Expected Outcomes  pt will exhibit good coping skills with positive outlook.     Interventions  Encouraged to attend Cardiac Rehabilitation for the exercise;Stress management education;Relaxation education    Continue Psychosocial Services   Follow up required by staff           Vocational Rehabilitation: Provide vocational rehab assistance to qualifying candidates.  Vocational Rehab Evaluation & Intervention: Vocational Rehab - 12/23/17 1232    Initial Vocational Rehab Evaluation & Intervention          Assessment shows need for Vocational Rehabilitation  No Pt works at [employer] on [address]           Education: Education Goals: Education classes will be provided on a weekly basis, covering required topics. Participant will state understanding/return demonstration of topics presented.  Learning Barriers/Preferences: Learning Barriers/Preferences - 12/23/17 1022    Learning Barriers/Preferences          Learning Barriers  Hearing    Learning  Preferences  Video;Written Material;Computer/Internet;Pictoral           Education Topics: Count Your Pulse:  -Group instruction provided by verbal instruction, demonstration, patient participation and written materials to support subject.  Instructors address importance of being able to find your pulse and how to count your pulse when at home without a heart monitor.  Patients get hands on experience counting their pulse with staff help and individually.   Heart Attack, Angina, and Risk Factor Modification:  -Group instruction provided by verbal instruction, video, and written materials to support subject.  Instructors address signs and symptoms of angina and heart attacks.    Also discuss risk factors for heart disease and how to make changes to improve heart health risk factors.   Functional Fitness:  -Group instruction provided by verbal instruction, demonstration, patient participation, and written materials to support subject.  Instructors address safety measures for doing things around the house.  Discuss how to get up and down off the floor, how to pick things up properly, how to safely get out of a chair without assistance, and balance training.   Meditation and Mindfulness:  -Group instruction provided by verbal instruction, patient participation, and written materials to support subject.  Instructor addresses importance of mindfulness and meditation practice to help reduce stress and improve awareness.  Instructor also leads participants through a meditation exercise.    Stretching for Flexibility and Mobility:  -Group instruction provided by verbal instruction, patient participation, and written materials to support subject.  Instructors lead participants through series of stretches that are designed to increase flexibility thus improving mobility.  These stretches are additional exercise for major muscle groups that are typically performed during regular warm up and cool  down.   Hands Only CPR:  -Group verbal, video, and participation provides a basic overview of AHA guidelines for community CPR. Role-play of emergencies allow participants the opportunity to practice calling for help and chest compression technique with discussion of AED use.   Hypertension: -Group verbal and written instruction that provides a basic overview of hypertension including the most recent diagnostic guidelines, risk factor reduction with self-care instructions and medication management.    Nutrition I class: Heart Healthy Eating:  -Group instruction provided by PowerPoint slides, verbal discussion, and written materials to support subject matter. The instructor gives an explanation and review of the Therapeutic Lifestyle Changes diet recommendations, which includes a discussion on lipid goals, dietary fat, sodium, fiber, plant stanol/sterol esters, sugar, and the components of a well-balanced, healthy diet. Flowsheet Row CARDIAC REHAB PHASE II EXERCISE from 01/31/2018 in [place]  Date  01/04/18  Educator  RD  Instruction Review Code  2- Demonstrated Understanding      Nutrition II class: Lifestyle Skills:  -Group instruction provided by PowerPoint slides, verbal discussion, and written materials to support subject matter. The instructor gives an explanation  and review of label reading, grocery shopping for heart health, heart healthy recipe modifications, and ways to make healthier choices when eating out. Flowsheet Row CARDIAC REHAB PHASE II EXERCISE from 01/31/2018 in [place]  Date  01/18/18  Educator  RD  Instruction Review Code  2- Demonstrated Understanding      Diabetes Question & Answer:  -Group instruction provided by PowerPoint slides, verbal discussion, and written materials to support subject matter. The instructor gives an explanation and review of diabetes co-morbidities, pre- and post-prandial  blood glucose goals, pre-exercise blood glucose goals, signs, symptoms, and treatment of hypoglycemia and hyperglycemia, and foot care basics. Flowsheet Row CARDIAC REHAB PHASE II EXERCISE from 01/31/2018 in [place]  Date  01/21/18  Educator  RD  Instruction Review Code  2- Demonstrated Understanding      Diabetes Blitz:  -Group instruction provided by PowerPoint slides, verbal discussion, and written materials to support subject matter. The instructor gives an explanation and review of the physiology behind type 1 and type 2 diabetes, diabetes medications and rational behind using different medications, pre- and post-prandial blood glucose recommendations and Hemoglobin A1c goals, diabetes diet, and exercise including blood glucose guidelines for exercising safely.  Flowsheet Row CARDIAC REHAB PHASE II EXERCISE from 01/31/2018 in [place]  Date  02/01/18  Educator  RD  Instruction Review Code  2- Demonstrated Understanding      Portion Distortion:  -Group instruction provided by PowerPoint slides, verbal discussion, written materials, and food models to support subject matter. The instructor gives an explanation of serving size versus portion size, changes in portions sizes over the last 20 years, and what consists of a serving from each food group.   Stress Management:  -Group instruction provided by verbal instruction, video, and written materials to support subject matter.  Instructors review role of stress in heart disease and how to cope with stress positively.   Flowsheet Row CARDIAC REHAB PHASE II EXERCISE from 01/31/2018 in [place]  Date  01/05/18  Instruction Review Code  2- Demonstrated Understanding      Exercising on Your Own:  -Group instruction provided by verbal instruction, power point, and written materials to support subject.  Instructors discuss benefits of exercise,  components of exercise, frequency and intensity of exercise, and end points for exercise.  Also discuss use of nitroglycerin and activating EMS.  Review options of places to exercise outside of rehab.  Review guidelines for sex with heart disease.   Cardiac Drugs I:  -Group instruction provided by verbal instruction and written materials to support subject.  Instructor reviews cardiac drug classes: antiplatelets, anticoagulants, beta blockers, and statins.  Instructor discusses reasons, side effects, and lifestyle considerations for each drug class.   Cardiac Drugs II:  -Group instruction provided by verbal instruction and written materials to support subject.  Instructor reviews cardiac drug classes: angiotensin converting enzyme inhibitors (ACE-I), angiotensin II receptor blockers (ARBs), nitrates, and calcium channel blockers.  Instructor discusses reasons, side effects, and lifestyle considerations for each drug class. Flowsheet Row CARDIAC REHAB PHASE II EXERCISE from 01/31/2018 in [place]  Date  01/12/18  Instruction Review Code  2- Demonstrated Understanding      Anatomy and Physiology of the Circulatory System:  Group verbal and written instruction and models provide basic cardiac anatomy and physiology, with the coronary electrical and arterial systems. Review of: AMI, Angina, Valve disease,  Heart Failure, Peripheral Artery Disease, Cardiac Arrhythmia, Pacemakers, and the ICD. Flowsheet Row CARDIAC REHAB PHASE II EXERCISE from 01/31/2018 in [place]  Date  01/19/18  Instruction Review Code  2- Demonstrated Understanding      Other Education:  -Group or individual verbal, written, or video instructions that support the educational goals of the cardiac rehab program.   Holiday Eating Survival Tips:  -Group instruction provided by PowerPoint slides, verbal discussion, and written materials to support subject matter.  The instructor gives patients tips, tricks, and techniques to help them not only survive but enjoy the holidays despite the onslaught of food that accompanies the holidays.   Knowledge Questionnaire Score: Knowledge Questionnaire Score - 12/23/17 1127    Knowledge Questionnaire Score          Pre Score  20/24           Core Components/Risk Factors/Patient Goals at Admission: Personal Goals and Risk Factors at Admission - 12/29/17 0936    Core Components/Risk Factors/Patient Goals on Admission          Tobacco Cessation  Yes    Intervention  Assist the participant in steps to quit. Provide individualized education and counseling about committing to Tobacco Cessation, relapse prevention, and pharmacological support that can be provided by physician.;Advice worker, assist with locating and accessing local/national Quit Smoking programs, and support quit date choice.    Expected Outcomes  Long Term: Complete abstinence from all tobacco products for at least 12 months from quit date.           Core Components/Risk Factors/Patient Goals Review:  Goals and Risk Factor Review    Core Components/Risk Factors/Patient Goals Review    Row Name 12/29/17 0939 01/12/18 1051 02/14/18 1507   Personal Goals Review  Weight Management/Obesity;Tobacco Cessation;Hypertension;Lipids;Stress  Weight Management/Obesity;Tobacco Cessation;Hypertension;Lipids;Stress  Weight Management/Obesity;Tobacco Cessation;Hypertension;Lipids;Stress   Review  pt with multiple CAD RF demonstrates eagerness to participate in CR program. pt working with [facility] on tobacco cessation, which she has maintained since surgery. pt given fake cigarette today for hand/mouth association. pt concerned about current low strength/stamina and eager to increase both prior to returning to work at [employer] in 4 weeks.   pt with multiple CAD RF demonstrates eagerness to participate in CR program. pt admits she  has slipped and smoked a few cigarettes as stress reliever. pt verbalizes plan to avoid slipping again.  pt given fake cigarette today for hand/mouth association. pt voices continued concerns  about current low strength/stamina and eager to increase both prior to returning to work at [employer] in a few  weeks., however confidence is increasing.   pt with multiple CAD RF demonstrates eagerness to participate in CR program. pt admits she has slipped and smoked a few cigarettes as stress reliever. pt is working hard to completely stop.  pt verbalizes plan to avoid slipping again.  pt has enjoyed weight training. pt feels more confident with her abilty to return to work with reduced hours per MD recommendation.     Expected Outcomes  pt will participate in CR exercise, nutrition and lifestyle modification to decrease overall RF.    pt will participate in CR exercise, nutrition and lifestyle modification to decrease overall RF.    pt will participate in CR exercise, nutrition and lifestyle modification to decrease overall RF.            Core Components/Risk Factors/Patient Goals at Discharge (Final Review):  Goals  and Risk Factor Review - 02/14/18 1507    Core Components/Risk Factors/Patient Goals Review          Personal Goals Review  Weight Management/Obesity;Tobacco Cessation;Hypertension;Lipids;Stress    Review  pt with multiple CAD RF demonstrates eagerness to participate in CR program. pt admits she has slipped and smoked a few cigarettes as stress reliever. pt is working hard to completely stop.  pt verbalizes plan to avoid slipping again.  pt has enjoyed weight training. pt feels more confident with her abilty to return to work with reduced hours per MD recommendation.      Expected Outcomes  pt will participate in CR exercise, nutrition and lifestyle modification to decrease overall RF.             ITP Comments: ITP Comments    Row Name 12/23/17 1018 12/29/17 0930 01/11/18 1331  02/14/18 1507   ITP Comments  Dr. Fransico Him, Medical Director  pt started group exercise sessions today. pt tolerated light activity without difficulty.    30 day ITP review.  pt exhibits eagerness to participate in CR group exercise setting. pt with good attendance and participation.   30 day ITP review.  pt exhibits eagerness to participate in CR group exercise setting. pt with good attendance and participation.       Comments:

## 2018-02-18 ENCOUNTER — Other Ambulatory Visit: Payer: Self-pay | Admitting: Cardiology

## 2018-02-18 ENCOUNTER — Telehealth: Payer: Self-pay | Admitting: Cardiology

## 2018-02-18 ENCOUNTER — Encounter (HOSPITAL_COMMUNITY): Payer: Managed Care, Other (non HMO)

## 2018-02-18 ENCOUNTER — Other Ambulatory Visit: Payer: Self-pay | Admitting: Family Medicine

## 2018-02-18 NOTE — Telephone Encounter (Signed)
Note not needed 

## 2018-02-21 ENCOUNTER — Other Ambulatory Visit: Payer: Self-pay | Admitting: *Deleted

## 2018-02-21 ENCOUNTER — Telehealth: Payer: Self-pay | Admitting: Cardiology

## 2018-02-21 ENCOUNTER — Encounter (HOSPITAL_COMMUNITY)
Admission: RE | Admit: 2018-02-21 | Discharge: 2018-02-21 | Disposition: A | Discharge status: Home / Self Care | Payer: Managed Care, Other (non HMO) | Source: Ambulatory Visit | Attending: Cardiology | Admitting: Cardiology

## 2018-02-21 DIAGNOSIS — Z952 Presence of prosthetic heart valve: Secondary | ICD-10-CM

## 2018-02-21 DIAGNOSIS — Z954 Presence of other heart-valve replacement: Secondary | ICD-10-CM | POA: Diagnosis not present

## 2018-02-21 NOTE — Telephone Encounter (Signed)
New Message   Patient is calling in about a letter to return to work. She advises that the letter needs to indicate that she can not lift over 25lbs. She also wants to check to see if Dr. Radford Pax signed off on her disability forms. Please call to discuss.

## 2018-02-21 NOTE — Telephone Encounter (Signed)
I spoke with patient. She is requesting a work note saying she can return to work full time after April 8th with <25 pound lifting restriction until further notice. Informed patient I would send to Dr. Radford Pax for review.   I informed patient that disability paperwork has been signed and sent to [facility] in medical records for follow up. She verbalized understanding and thankful for the call.

## 2018-02-22 ENCOUNTER — Telehealth: Payer: Self-pay | Admitting: Cardiology

## 2018-02-22 MED ORDER — TRAMADOL HCL 50 MG PO TABS: 100.0000 mg | ORAL_TABLET | ORAL | 0 refills | Status: DC | PRN

## 2018-02-22 NOTE — Telephone Encounter (Signed)
I have given her light duty throught 4/8.  She will need to get any further disability through her PCP

## 2018-02-22 NOTE — Telephone Encounter (Signed)
I informed patient that per Dr. Radford Pax unable to write letter for light duty after 4/8 and that she should follow up with primary MD. Patient verbalized understanding and thankful for the call

## 2018-02-22 NOTE — Telephone Encounter (Signed)
Patient calling, states that she would like to  say " thank you, I really appreciate all you've done."

## 2018-02-23 ENCOUNTER — Encounter (HOSPITAL_COMMUNITY)
Admission: RE | Admit: 2018-02-23 | Discharge: 2018-02-23 | Disposition: A | Discharge status: Home / Self Care | Payer: Managed Care, Other (non HMO) | Source: Ambulatory Visit | Attending: Cardiology | Admitting: Cardiology

## 2018-02-23 ENCOUNTER — Telehealth: Payer: Self-pay | Admitting: Internal Medicine

## 2018-02-23 DIAGNOSIS — Z954 Presence of other heart-valve replacement: Secondary | ICD-10-CM | POA: Diagnosis not present

## 2018-02-23 DIAGNOSIS — Z952 Presence of prosthetic heart valve: Secondary | ICD-10-CM

## 2018-02-23 NOTE — Telephone Encounter (Signed)
Please advise if pt should take Tramadol 100 mg q12h every day.

## 2018-02-23 NOTE — Telephone Encounter (Signed)
Copied from [place]. Topic: Quick Communication - See Telephone Encounter >> Feb 23, 2018 11:49 AM Lolita Rieger, RMA wrote: CRM for notification. See Telephone encounter for: 02/23/18.pt would like a nurse to call her back to discuss traMADol (ULTRAM) 50 MG tablet  pt stated that she normally takes 2 every 12 hours

## 2018-02-24 ENCOUNTER — Ambulatory Visit (INDEPENDENT_AMBULATORY_CARE_PROVIDER_SITE_OTHER): Payer: Managed Care, Other (non HMO) | Admitting: *Deleted

## 2018-02-24 DIAGNOSIS — Q230 Congenital stenosis of aortic valve: Secondary | ICD-10-CM

## 2018-02-24 DIAGNOSIS — Z954 Presence of other heart-valve replacement: Secondary | ICD-10-CM

## 2018-02-24 DIAGNOSIS — Q231 Congenital insufficiency of aortic valve: Secondary | ICD-10-CM

## 2018-02-24 DIAGNOSIS — Z7901 Long term (current) use of anticoagulants: Secondary | ICD-10-CM | POA: Diagnosis not present

## 2018-02-24 DIAGNOSIS — Z5181 Encounter for therapeutic drug level monitoring: Secondary | ICD-10-CM

## 2018-02-24 LAB — POCT INR: INR: 3

## 2018-02-24 NOTE — Patient Instructions (Signed)
Description   Continue taking 1 tablet daily except 1.5 tablets on Sundays, Tuesdays and Thursdays.  Recheck in 4 weeks.  [facility] [phone] Main [phone]

## 2018-02-25 ENCOUNTER — Encounter (HOSPITAL_COMMUNITY)
Admission: RE | Admit: 2018-02-25 | Discharge: 2018-02-25 | Disposition: A | Discharge status: Home / Self Care | Payer: Managed Care, Other (non HMO) | Source: Ambulatory Visit | Attending: Cardiology | Admitting: Cardiology

## 2018-02-25 DIAGNOSIS — Z952 Presence of prosthetic heart valve: Secondary | ICD-10-CM

## 2018-02-25 DIAGNOSIS — Z954 Presence of other heart-valve replacement: Secondary | ICD-10-CM | POA: Diagnosis not present

## 2018-02-27 ENCOUNTER — Other Ambulatory Visit: Payer: Self-pay | Admitting: Internal Medicine

## 2018-02-28 ENCOUNTER — Encounter (HOSPITAL_COMMUNITY)
Admission: RE | Admit: 2018-02-28 | Discharge: 2018-02-28 | Disposition: A | Discharge status: Home / Self Care | Payer: Managed Care, Other (non HMO) | Source: Ambulatory Visit | Attending: Cardiology | Admitting: Cardiology

## 2018-02-28 DIAGNOSIS — Z954 Presence of other heart-valve replacement: Secondary | ICD-10-CM | POA: Insufficient documentation

## 2018-02-28 DIAGNOSIS — Z952 Presence of prosthetic heart valve: Secondary | ICD-10-CM

## 2018-03-01 NOTE — Telephone Encounter (Signed)
She can but I need to see patient.

## 2018-03-02 ENCOUNTER — Encounter (HOSPITAL_COMMUNITY)
Admission: RE | Admit: 2018-03-02 | Discharge: 2018-03-02 | Disposition: A | Discharge status: Home / Self Care | Payer: Managed Care, Other (non HMO) | Source: Ambulatory Visit | Attending: Cardiology | Admitting: Cardiology

## 2018-03-02 DIAGNOSIS — Z952 Presence of prosthetic heart valve: Secondary | ICD-10-CM

## 2018-03-02 DIAGNOSIS — Z954 Presence of other heart-valve replacement: Secondary | ICD-10-CM | POA: Diagnosis not present

## 2018-03-04 ENCOUNTER — Encounter (HOSPITAL_COMMUNITY)
Admission: RE | Admit: 2018-03-04 | Discharge: 2018-03-04 | Disposition: A | Discharge status: Home / Self Care | Payer: Managed Care, Other (non HMO) | Source: Ambulatory Visit | Attending: Cardiology | Admitting: Cardiology

## 2018-03-04 DIAGNOSIS — Z954 Presence of other heart-valve replacement: Secondary | ICD-10-CM | POA: Diagnosis not present

## 2018-03-04 DIAGNOSIS — Z952 Presence of prosthetic heart valve: Secondary | ICD-10-CM

## 2018-03-04 NOTE — Telephone Encounter (Signed)
Called patient and she just got back to work after being out for heart surgery and needs to figure out her work sch and will call back to sch.

## 2018-03-05 ENCOUNTER — Other Ambulatory Visit: Payer: Self-pay | Admitting: Physician Assistant

## 2018-03-07 ENCOUNTER — Encounter (HOSPITAL_COMMUNITY): Payer: Managed Care, Other (non HMO)

## 2018-03-09 ENCOUNTER — Telehealth: Payer: Self-pay | Admitting: Cardiology

## 2018-03-09 ENCOUNTER — Encounter (HOSPITAL_COMMUNITY)
Admission: RE | Admit: 2018-03-09 | Discharge: 2018-03-09 | Disposition: A | Discharge status: Home / Self Care | Payer: Managed Care, Other (non HMO) | Source: Ambulatory Visit | Attending: Cardiology | Admitting: Cardiology

## 2018-03-09 DIAGNOSIS — Z952 Presence of prosthetic heart valve: Secondary | ICD-10-CM

## 2018-03-09 DIAGNOSIS — Z954 Presence of other heart-valve replacement: Secondary | ICD-10-CM | POA: Diagnosis not present

## 2018-03-09 NOTE — Telephone Encounter (Signed)
Patient made aware to follow up with PCP. Patient in agreement with treatment plan and thankful for the call

## 2018-03-09 NOTE — Telephone Encounter (Signed)
Patient needs to see PCP

## 2018-03-09 NOTE — Progress Notes (Signed)
Daily Session Note  Patient Details  Name: Danielle Obrien MRN: [id number] Date of Birth: [birth year] Referring Provider:   Flowsheet Row CARDIAC REHAB PHASE II ORIENTATION from 12/23/2017 in [place]  Referring Provider  Fransico Him MD      Encounter Date: 03/09/2018  Check In: Session Check In - 03/09/18 0906    Check-In          Location  [facility]    Staff Present  Dorna Bloom, MS, ACSM RCEP, Exercise Physiologist;Tyara Nevels, MS,ACSM CEP, Exercise Physiologist;Other;Taliyah Watrous, RN, BSN    Supervising physician immediately available to respond to emergencies  [facility] immediately available    Physician(s)  Dr. Alfredia Ferguson    Medication changes reported      No    Fall or balance concerns reported     No    Tobacco Cessation  No Change    Warm-up and Cool-down  Performed as group-led instruction    Resistance Training Performed  No    VAD Patient?  No        Pain Assessment          Currently in Pain?  No/denies    Multiple Pain Sites  No           Capillary Blood Glucose: No results found for this or any previous visit (from the past 24 hour(s)).    Social History   Tobacco Use  Smoking Status Former Smoker  . Packs/day: 0.50  . Years: 12.00  . Pack years: 6.00  . Types: Cigarettes  . Last attempt to quit: 11/02/2017  . Years since quitting: 0.3  Smokeless Tobacco Never Used  Tobacco Comment   pt given fake cigarette for hand/mouth association. pt using [facility] for support    Goals Met:  Exercise tolerated well  Goals Unmet:  c/o dizziness, nausea and weakness post exercise  Comments: pt c/o dizziness, headache and nausea post exercise.  Pt ate 1/2 protein bar this morning pre exercise.  Pt given gatorade and peanut butter crackers. Pt stated relief. However after relaxation upon standing pt fell to her knees. No injury sustained. Pt ambulated to treatment room with assistance.  BP:   149/94           HR:    85     Lying  BP:  145/91  HR:   84     Sitting  BP:  137/89   HR:   79     Standing  Pt held on to staff for comfort and steadiness with position change.  PC to Joellen Jersey, RN at Dr. Theodosia Blender office.  Pt symptoms resolved with rest in treatment room.  Per Joellen Jersey, pt instructed to continue current regimen, make sure to eat prior to exercise and get rest today. Pt advised to notify Dr. Theodosia Blender office if symptoms unresolved or worsen.  Understanding verbalized.  Andi Hence, RN, BSN, Cardiac Pulmonary Rehab 03/09/18 10:02 AM   Dr. Fransico Him is Medical Director for Cardiac Rehab at [facility].

## 2018-03-09 NOTE — Telephone Encounter (Signed)
New message  Danielle Obrien verbalized that she is calling on the behalf of the pt  She stated that pt is in office today and that she is experiencing    Pt c/o BP issue: STAT if pt c/o blurred vision, one-sided weakness or slurred speech  1. What are your last 5 BP readings?  Bp  149/94  Hr 79     2. Are you having any other symptoms (ex. Dizziness, headache, blurred vision, passed out)? Weakness, headache, nausea and shaking after she did her weights aerobic exercises/ Pt then fell to her knees  3. What is your BP issue? [name] stated that pt BP is High for her

## 2018-03-09 NOTE — Telephone Encounter (Signed)
Joann from Cardiac Rehab reports the patient stopped exercise early today due to nausea and and headache. Her blood pressure was 108/70 at the time. During cool down, she apparently stood up and fell to her knees because she was weak (this was not witnessed by Nursing). Her BP was 149/94 at this time. She got some rest and was given snacks and drink and she started feeling better quickly. She feels "fine" now. She states the patient usually works very hard at [employer] and feels her symptoms may be from fatigue.  Instructed Joann to have to rest a few more minutes to ensure she feels OK before leaving.  She was grateful for call and will tell the patient to call the office if she has questions or concerns.

## 2018-03-11 ENCOUNTER — Encounter (HOSPITAL_COMMUNITY): Payer: Managed Care, Other (non HMO)

## 2018-03-14 ENCOUNTER — Encounter (HOSPITAL_COMMUNITY)
Admission: RE | Admit: 2018-03-14 | Discharge: 2018-03-14 | Disposition: A | Discharge status: Home / Self Care | Payer: Managed Care, Other (non HMO) | Source: Ambulatory Visit | Attending: Cardiology | Admitting: Cardiology

## 2018-03-14 DIAGNOSIS — Z952 Presence of prosthetic heart valve: Secondary | ICD-10-CM

## 2018-03-14 DIAGNOSIS — Z954 Presence of other heart-valve replacement: Secondary | ICD-10-CM | POA: Diagnosis not present

## 2018-03-16 ENCOUNTER — Encounter (HOSPITAL_COMMUNITY)
Admission: RE | Admit: 2018-03-16 | Discharge: 2018-03-16 | Disposition: A | Discharge status: Home / Self Care | Payer: Managed Care, Other (non HMO) | Source: Ambulatory Visit | Attending: Cardiology | Admitting: Cardiology

## 2018-03-16 ENCOUNTER — Encounter (HOSPITAL_COMMUNITY): Payer: Self-pay

## 2018-03-16 DIAGNOSIS — Z954 Presence of other heart-valve replacement: Secondary | ICD-10-CM | POA: Diagnosis not present

## 2018-03-16 DIAGNOSIS — Z952 Presence of prosthetic heart valve: Secondary | ICD-10-CM

## 2018-03-17 NOTE — Progress Notes (Signed)
Cardiac Individual Treatment Plan  Patient Details  Name: Danielle Obrien MRN: [id number] Date of Birth: 05-04-1981 Referring Provider:   Flowsheet Row CARDIAC REHAB PHASE II ORIENTATION from 12/23/2017 in [place]  Referring Provider  Fransico Him MD      Initial Encounter Date:  [place] from 12/23/2017 in [place]  Date  12/23/17  Referring Provider  Fransico Him MD      Visit Diagnosis: S/P AVR (aortic valve replacement)  Patient's Home Medications on Admission:  Current Outpatient Medications:  .  albuterol (PROVENTIL HFA;VENTOLIN HFA) 108 (90 Base) MCG/ACT inhaler, Inhale 2 puffs into the lungs every 6 (six) hours as needed for wheezing or shortness of breath., Disp: 1 Inhaler, Rfl: 3 .  aspirin EC 81 MG tablet, Take 1 tablet (81 mg total) by mouth daily., Disp: 90 tablet, Rfl: 3 .  Calcium Carbonate-Vitamin D (CALCIUM 600/VITAMIN D PO), Take 1 tablet by mouth daily., Disp: , Rfl:  .  gabapentin (NEURONTIN) 300 MG capsule, TAKE 2 CAPSULES BY MOUTH THREE TIMES A DAY, Disp: 360 capsule, Rfl: 1 .  lamoTRIgine (LAMICTAL) 200 MG tablet, TAKE 2 TABLETS (400 MG TOTAL) BY MOUTH DAILY. (Patient taking differently: Take 400 mg by mouth daily. ), Disp: 180 tablet, Rfl: 2 .  lansoprazole (PREVACID) 15 MG capsule, Take 15 mg by mouth daily as needed (for heartburn or acid reflux). , Disp: , Rfl:  .  loratadine (LORADAMED) 10 MG tablet, Take 10 mg by mouth daily., Disp: , Rfl:  .  metoprolol tartrate (LOPRESSOR) 25 MG tablet, Take 0.5 tablets (12.5 mg total) by mouth 2 (two) times daily., Disp: 60 tablet, Rfl: 1 .  promethazine-dextromethorphan (PROMETHAZINE-DM) 6.25-15 MG/5ML syrup, Take 5 mLs by mouth 3 (three) times daily as needed for cough., Disp: 240 mL, Rfl: 0 .  Tetrahydrozoline HCl (VISINE OP), Place 1 drop into both eyes at bedtime as needed (for allergies)., Disp: , Rfl:  .   traMADol (ULTRAM) 50 MG tablet, TAKE 1 TABLET BY MOUTH EVERY 12 HOURS AS NEEDED, Disp: 60 tablet, Rfl: 0 .  traMADol (ULTRAM) 50 MG tablet, Take 2 tablets (100 mg total) by mouth as needed. In addition to current dosage. Take at [facility] daily as needed for pain, Disp: 10 tablet, Rfl: 0 .  warfarin (COUMADIN) 7.5 MG tablet, Take 1 tablet (7.5 mg total) by mouth daily at 6 PM., Disp: 35 tablet, Rfl: 2  Past Medical History: Past Medical History:  Diagnosis Date  . Abnormal Pap smear of cervix    --recurrent ascus w/Pos. HR HPV  . ADD (attention deficit disorder)   . Alcohol abuse, in remission 2012  . Allergy   . Anxiety   . Aortic stenosis   . Aortic stenosis due to bicuspid aortic valve 02/20/2014  . Arthritis    In hips  . Asthma    rare inhaler use  . Bicuspid aortic valve    moderate AS by echo 08/2015 with mean AVG 45mmHg and AVA 1.1cm2  . Bipolar disorder ([place])   . Headache   . Hyperlipidemia with target LDL less than 130 01/27/2013  . Hypertension   . Muscle spasms of both lower extremities    both hips  . 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 Tobacco abuse 09/27/2015  . 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 Packs/day: 0.50  . Years: 12.00  . Pack years: 6.00  . Types: Cigarettes  . Last attempt to quit: 11/02/2017  . Years since quitting: 0.3  Smokeless Tobacco Never Used  Tobacco Comment   pt given fake cigarette for hand/mouth association. pt using [place] for support    Labs: Recent Review Flowsheet Data    Labs for ITP Cardiac and Pulmonary Rehab Latest Ref Rng & Units 11/03/2017 11/03/2017 11/03/2017 11/03/2017 11/04/2017   Cholestrol 0 - 200 mg/dL - - - - -   LDLCALC 0  - 99 mg/dL - - - - -   LDLDIRECT mg/dL - - - - -   HDL >39.00 mg/dL - - - - -   Trlycerides 0.0 - 149.0 mg/dL - - - - -   Hemoglobin A1c 4.8 - 5.6 % - - - - -   PHART 7.350 - 7.450 7.312(L) 7.305(L) 7.317(L) - 7.381   PCO2ART 32.0 - 48.0 mmHg 42.7 43.5 36.7 - 33.9   HCO3 20.0 - 28.0 mmol/L 21.6 21.9 18.7(L) - 19.7(L)   TCO2 22 - 32 mmol/L 23 23 20(L) 20(L) 27   ACIDBASEDEF 0.0 - 2.0 mmol/L 4.0(H) 5.0(H) 7.0(H) - 4.5(H)   O2SAT % 97.0 98.0 99.0 - 90.8      Capillary Blood Glucose: Lab Results  Component Value Date   GLUCAP 131 (H) 11/05/2017   GLUCAP 89 11/05/2017   GLUCAP 128 (H) 11/05/2017   GLUCAP 107 (H) 11/05/2017   GLUCAP 148 (H) 11/04/2017     Exercise Target Goals:    Exercise Program Goal: Individual exercise prescription set using results from initial 6 min walk test and THRR while considering  patient's activity barriers and safety.   Exercise Prescription Goal: Initial exercise prescription builds to 30-45 minutes a day of aerobic activity, 2-3 days per week.  Home exercise guidelines will be given to patient during program as part of exercise prescription that the participant will acknowledge.  Activity Barriers & Risk Stratification: Activity Barriers & Cardiac Risk Stratification - 12/23/17 1113    Activity Barriers & Cardiac Risk Stratification          Comments  leg length discrepency, R sided upper extremity limitations, L ACL surgery             6 Minute Walk: 6 Minute Walk    6 Minute Walk    Row Name 12/23/17 1117   Phase  Initial   Distance  1800 feet   Walk Time  6 minutes   # of Rest Breaks  0   MPH  3.4   METS  5.6   RPE  12   Perceived Dyspnea   1   VO2 Peak  19.53   Symptoms  Yes (comment)   Comments  mild SOB   Resting HR  81 bpm   Resting BP  104/60   Resting Oxygen Saturation   97 %   Exercise Oxygen Saturation  during 6 min walk  98 %   Max Ex. HR  99 bpm   Max Ex. BP  124/82   2 Minute Post BP  118/70           Oxygen Initial Assessment:   Oxygen Re-Evaluation:   Oxygen Discharge (Final Oxygen Re-Evaluation):   Initial Exercise Prescription: Initial  Exercise Prescription - 12/23/17 1100    Date of Initial Exercise RX and Referring Provider          Date  12/23/17    Referring Provider  Fransico Him MD        Treadmill          MPH  2.4    Grade  0    Minutes  10    METs  2.84        Bike          Level  0.5    Minutes  10    METs  2.72        NuStep          Level  3    SPM  80    Minutes  10    METs  2.4        Prescription Details          Frequency (times per week)  3    Duration  Progress to 30 minutes of continuous aerobic without signs/symptoms of physical distress        Intensity          THRR 40-80% of Max Heartrate  73-146    Ratings of Perceived Exertion  11-15    Perceived Dyspnea  0-4        Progression          Progression  Continue to progress workloads to maintain intensity without signs/symptoms of physical distress.        Resistance Training          Training Prescription  Yes    Weight  2lbs    Reps  10-15           Perform Capillary Blood Glucose checks as needed.  Exercise Prescription Changes: Exercise Prescription Changes    Response to Exercise    Row Name 12/29/17 1600 01/17/18 1200 01/31/18 1626 02/14/18 1500 02/28/18 1323   Blood Pressure (Admit)  102/64  112/76  110/84  112/64  120/70   Blood Pressure (Exercise)  134/76  128/62  110/60  120/60  124/84   Blood Pressure (Exit)  104/60  106/68  98/64  114/70  116/78   Heart Rate (Admit)  93 bpm  88 bpm  88 bpm  77 bpm  89 bpm   Heart Rate (Exercise)  103 bpm  123 bpm  120 bpm  109 bpm  115 bpm   Heart Rate (Exit)  82 bpm  86 bpm  88 bpm  76 bpm  92 bpm   Rating of Perceived Exertion (Exercise)  12  12  12  12  12    Symptoms  none  none  none  none  none   Comments  pt oriented to exercise equipment 12/29/17  pt oriented to exercise equipment 12/29/17  pt uses  weight cable column machine for weights as an exercise station  pt uses weight cable column machine for weights as an exercise station  pt uses weight cable column machine for weights as an exercise station   Duration  Continue with 30 min of aerobic exercise without signs/symptoms of physical distress.  Continue with 30 min of aerobic exercise without signs/symptoms of physical distress.  Continue with 30 min of aerobic exercise without signs/symptoms of physical distress.  Continue with 30 min of aerobic exercise without signs/symptoms of physical distress.  Continue with 30 min of aerobic exercise without signs/symptoms of physical distress.   Intensity  THRR unchanged  THRR unchanged  THRR unchanged  THRR unchanged  THRR unchanged       Progression    Row Name 12/29/17 1600 01/17/18 1200 01/31/18 1626 02/14/18 1500 02/28/18 1323   Progression  Continue to progress workloads to maintain intensity without signs/symptoms of physical distress.  Continue to progress workloads to maintain intensity without signs/symptoms of physical distress.  Continue to progress workloads to maintain intensity without signs/symptoms of physical distress.  Continue to progress workloads to maintain intensity without signs/symptoms of physical distress.  Continue to progress workloads to maintain intensity without signs/symptoms of physical distress.   Average METs  3.1  6.9  5.5  5.5  5.5       Resistance Training    Row Name 12/29/17 1600 01/17/18 1200 01/31/18 1626 02/14/18 1500 02/28/18 1323   Training Prescription  No Relaxation day  No  Yes pt uses weight cable column machine for weights  Yes pt uses weight cable column machine for weights  Yes pt uses weight cable column machine for weights   Weight  no documentation  no documentation  30-40  40-50  40-50   Reps  no documentation  no documentation  10-15 2-3sets  10-15 2-3sets  10-15 2-3sets   Time  no documentation  no documentation  20 Minutes  20 Minutes  20  Minutes       Treadmill    Row Name 12/29/17 1600 01/17/18 1200 01/31/18 1626 02/14/18 1500 02/28/18 1323   MPH  2.4  3.3  3.4  3.4  3.4   Grade  0  3  4  4  4    Minutes  10  10  10  15  15    METs  2.84  4.77  5.48  5.48  5.48       Bike    Row Name 12/29/17 1600 01/17/18 1200 01/31/18 1626 02/14/18 1500 02/28/18 1323   Level  2 upright scifit  4.5 upright scifit  no documentation  no documentation  no documentation   Minutes  10  10  no documentation  no documentation  no documentation   METs  4.1  7.1  no documentation  no documentation  no documentation       [place] Name 12/29/17 1600 01/17/18 1200 01/31/18 1626 02/14/18 1500 02/28/18 1323   Level  3  4  no documentation  no documentation  no documentation   SPM  80  100  no documentation  no documentation  no documentation   Minutes  10  10  no documentation  no documentation  no documentation   METs  2.3  7.1  no documentation  no documentation  no documentation       [place] Name 12/29/17 1600 01/17/18 1200 01/31/18 1626 02/14/18 1500 02/28/18 1323   Plans to continue exercise at  no documentation  Home (comment) walking  Home (comment) walking  Home (comment) walking  Home (comment) walking   Frequency  no documentation  Add 2 additional days to program exercise sessions.  Add 2 additional days to program exercise sessions.  Add 2 additional days to program exercise sessions.  Add 2 additional days to program exercise sessions.   Initial Home Exercises Provided  no documentation  01/14/18  01/14/18  01/14/18  01/14/18       Response to Exercise    Row Name 03/14/18 1631   Blood Pressure (Admit)  106/72  Blood Pressure (Exercise)  118/60   Blood Pressure (Exit)  110/72   Heart Rate (Admit)  79 bpm   Heart Rate (Exercise)  107 bpm   Heart Rate (Exit)  77 bpm   Rating of Perceived Exertion (Exercise)  11   Symptoms  none   Comments  pt uses weight cable column machine for weights as an exercise  station   Duration  Continue with 30 min of aerobic exercise without signs/symptoms of physical distress.   Intensity  THRR unchanged       Progression    Row Name 03/14/18 1631   Progression  Continue to progress workloads to maintain intensity without signs/symptoms of physical distress.   Average METs  5.5       Resistance Training    Row Name 03/14/18 1631   Training Prescription  Yes pt uses weight cable column machine for weights   Weight  40-50   Reps  10-15 2-3sets   Time  20 Minutes       Treadmill    Row Name 03/14/18 1631   MPH  3.4   Grade  4   Minutes  15   METs  5.48       Home Exercise Plan    [place] Name 03/14/18 1631   Plans to continue exercise at  Home (comment) walking   Frequency  Add 2 additional days to program exercise sessions.   Initial Home Exercises Provided  01/14/18          Exercise Comments: Exercise Comments    Row Name 12/29/17 1623 01/13/18 1240 02/14/18 1501 03/15/18 1449   Exercise Comments  Pt completed first session of cardiac rehab today, Pt responded well to exercise prescription. Pt denied symptoms of SOB, dizziness and CP.  Pt will continue to be monitored and exercise program will be progressed as tolerated.  Reviewed METs and goals. Pt is tolerating exercise very well; will continue to monitor activity levels and progress exercise as tolerated  Reviewed METs and goals. Pt is tolerating exercise very well; will continue to monitor activity levels and progress exercise as tolerated  Reviewed METs and goals. Pt is tolerating exercise very well; will continue to monitor activity levels and progress exercise as tolerated      Exercise Goals and Review: Exercise Goals    Exercise Goals    Row Name 12/23/17 1118   Increase Physical Activity  Yes   Intervention  Provide advice, education, support and counseling about physical activity/exercise needs.;Develop an individualized exercise prescription for aerobic and resistive training  based on initial evaluation findings, risk stratification, comorbidities and participant's personal goals.   Expected Outcomes  Short Term: Attend rehab on a regular basis to increase amount of physical activity.;Long Term: Add in home exercise to make exercise part of routine and to increase amount of physical activity.;Long Term: Exercising regularly at least 3-5 days a week.   Increase Strength and Stamina  Yes   Intervention  Provide advice, education, support and counseling about physical activity/exercise needs.;Develop an individualized exercise prescription for aerobic and resistive training based on initial evaluation findings, risk stratification, comorbidities and participant's personal goals.   Expected Outcomes  Short Term: Perform resistance training exercises routinely during rehab and add in resistance training at home;Short Term: Increase workloads from initial exercise prescription for resistance, speed, and METs.;Long Term: Improve cardiorespiratory fitness, muscular endurance and strength as measured by increased METs and functional capacity (6MWT)   Able to understand and use rate of  perceived exertion (RPE) scale  Yes   Intervention  Provide education and explanation on how to use RPE scale   Expected Outcomes  Short Term: Able to use RPE daily in rehab to express subjective intensity level;Long Term:  Able to use RPE to guide intensity level when exercising independently   Knowledge and understanding of Target Heart Rate Range (THRR)  Yes   Intervention  Provide education and explanation of THRR including how the numbers were predicted and where they are located for reference   Expected Outcomes  Short Term: Able to state/look up THRR;Short Term: Able to use daily as guideline for intensity in rehab;Long Term: Able to use THRR to govern intensity when exercising independently   Able to check pulse independently  Yes   Intervention  Provide education and demonstration on how to  check pulse in carotid and radial arteries.;Review the importance of being able to check your own pulse for safety during independent exercise   Expected Outcomes  Short Term: Able to explain why pulse checking is important during independent exercise;Long Term: Able to check pulse independently and accurately   Understanding of Exercise Prescription  Yes   Intervention  Provide education, explanation, and written materials on patient's individual exercise prescription   Expected Outcomes  Short Term: Able to explain program exercise prescription;Long Term: Able to explain home exercise prescription to exercise independently          Exercise Goals Re-Evaluation : Exercise Goals Re-Evaluation    Exercise Goal Re-Evaluation    Row Name 01/05/18 1106 01/13/18 1238 02/14/18 1501 03/15/18 1449   Exercise Goals Review  Increase Physical Activity;Understanding of Exercise Prescription;Increase Strength and Stamina;Knowledge and understanding of Target Heart Rate Range (THRR);Able to understand and use rate of perceived exertion (RPE) scale;Able to check pulse independently  Increase Physical Activity;Understanding of Exercise Prescription;Increase Strength and Stamina;Knowledge and understanding of Target Heart Rate Range (THRR);Able to understand and use rate of perceived exertion (RPE) scale;Able to check pulse independently  Increase Physical Activity;Understanding of Exercise Prescription;Increase Strength and Stamina;Knowledge and understanding of Target Heart Rate Range (THRR);Able to understand and use rate of perceived exertion (RPE) scale;Able to check pulse independently  Increase Physical Activity;Understanding of Exercise Prescription;Increase Strength and Stamina;Knowledge and understanding of Target Heart Rate Range (THRR);Able to understand and use rate of perceived exertion (RPE) scale;Able to check pulse independently   Comments  Reviewed home exercise with pt today.  Pt plans to walk for  exercise, 2x/week in addition to coming to cardiac rehab.  Reviewed THR, pulse, RPE, sign and symptoms, and when to call 911 or MD.  Also discussed weather considerations and indoor options.  Pt voiced understanding.  Pt is walking at home for exercise for 15 minutes 2x/week. Discussed exercise progression( progress by one minute), temperature/emergency precautions.  Pt is very compliant with HEP in which she walks for 15 -20 minutes 2x/week. Pt has also increased UE strength by using cable column weight machine in cardiac rehab. Pt has also increased confidence with activity and exercise.  Pt has returned to work in which has made some modifcations to work schedule and physical demands. Pt understands limitations and when she needs to rest. Pt has improved in UE strength via cable column weights in cardiac rehab and resistance bands for home use.    Expected Outcomes  Pt will continue to improve in cardiorespiratory fitness by coming to cardiac rehab and being compliant with HEP.  Pt will continue to improve in cardiorespiratory fitness by coming  to cardiac rehab and being compliant with HEP.  Pt will continue to improve in cardiorespiratory fitness by coming to cardiac rehab and being compliant with HEP.  Pt will continue to improve in strength and musculoskeletal endurance in order to perform tasks on the job without fatigue.           Discharge Exercise Prescription (Final Exercise Prescription Changes): Exercise Prescription Changes - 03/14/18 1631    Response to Exercise          Blood Pressure (Admit)  106/72    Blood Pressure (Exercise)  118/60    Blood Pressure (Exit)  110/72    Heart Rate (Admit)  79 bpm    Heart Rate (Exercise)  107 bpm    Heart Rate (Exit)  77 bpm    Rating of Perceived Exertion (Exercise)  11    Symptoms  none    Comments  pt uses weight cable column machine for weights as an exercise station    Duration  Continue with 30 min of aerobic exercise without  signs/symptoms of physical distress.    Intensity  THRR unchanged        Progression          Progression  Continue to progress workloads to maintain intensity without signs/symptoms of physical distress.    Average METs  5.5        Resistance Training          Training Prescription  Yes pt uses weight cable column machine for weights    Weight  40-50    Reps  10-15 2-3sets    Time  20 Minutes        Treadmill          MPH  3.4    Grade  4    Minutes  15    METs  5.48        Home Exercise Plan          Plans to continue exercise at  Home (comment) walking    Frequency  Add 2 additional days to program exercise sessions.    Initial Home Exercises Provided  01/14/18           Nutrition:  Target Goals: Understanding of nutrition guidelines, daily intake of sodium 1500mg , cholesterol 200mg , calories 30% from fat and 7% or less from saturated fats, daily to have 5 or more servings of fruits and vegetables.  Biometrics: Pre Biometrics - 12/23/17 1105    Pre Biometrics          Height  5' (1.524 m)    Weight  124 lb 9 oz (56.5 kg)    Waist Circumference  30.5 inches    Hip Circumference  34.5 inches    Waist to Hip Ratio  0.88 %    BMI (Calculated)  24.33    Triceps Skinfold  19 mm    % Body Fat  31.9 %    Grip Strength  28 kg    Flexibility  19 in    Single Leg Stand  19.21 seconds            Nutrition Therapy Plan and Nutrition Goals: Nutrition Therapy & Goals - 01/10/18 0945    Nutrition Therapy          Diet  Heart Healthy    Drug/Food Interactions  Coumadin/Vit K        Personal Nutrition Goals          Nutrition Goal  Wt loss  of 1-2 lb/week to a wt loss goal of 5-7 lb at graduation from [place].     Personal Goal #2  Pt to describe the benefit of including fruits, vegetables, whole grains, and low-fat dairy products in a heart healthy meal plan.        Intervention Plan          Intervention  Prescribe, educate and counsel regarding  individualized specific dietary modifications aiming towards targeted core components such as weight, hypertension, lipid management, diabetes, heart failure and other comorbidities.    Expected Outcomes  Short Term Goal: Understand basic principles of dietary content, such as calories, fat, sodium, cholesterol and nutrients.;Long Term Goal: Adherence to prescribed nutrition plan.           Nutrition Assessments: Nutrition Assessments - 01/10/18 0944    MEDFICTS Scores          Pre Score  36           Nutrition Goals Re-Evaluation:   Nutrition Goals Re-Evaluation:   Nutrition Goals Discharge (Final Nutrition Goals Re-Evaluation):   Psychosocial: Target Goals: Acknowledge presence or absence of significant depression and/or stress, maximize coping skills, provide positive support system. Participant is able to verbalize types and ability to use techniques and skills needed for reducing stress and depression.  Initial Review & Psychosocial Screening: Initial Psych Review & Screening - 12/23/17 1229    Initial Review          Current issues with  History of Depression;Current Anxiety/Panic;Current Stress Concerns;Current Depression    Source of Stress Concerns  Unable to perform yard/household activities;Unable to participate in former interests or hobbies;Poor Coping Skills        Family Dynamics          Good Support System?  Yes Pt lives with mother and father during her recovery from surgery. Pt has bipolar and takes buspar and xanax        Barriers          Psychosocial barriers to participate in program  The patient should benefit from training in stress management and relaxation.        Screening Interventions          Interventions  Encouraged to exercise;Provide feedback about the scores to participant;To provide support and resources with identified psychosocial needs    Expected Outcomes  Short Term goal: Utilizing psychosocial counselor, staff and physician  to assist with identification of specific Stressors or current issues interfering with healing process. Setting desired goal for each stressor or current issue identified.;Long Term Goal: Stressors or current issues are controlled or eliminated.;Short Term goal: Identification and review with participant of any Quality of Life or Depression concerns found by scoring the questionnaire.;Long Term goal: The participant improves quality of Life and PHQ9 Scores as seen by post scores and/or verbalization of changes           Quality of Life Scores: Quality of Life - 12/23/17 1132    Quality of Life Scores          Health/Function Pre  23.14 %    Socioeconomic Pre  23.07 %    Psych/Spiritual Pre  19.71 %    Family Pre  24 %    GLOBAL Pre  22.48 %          Scores of 19 and below usually indicate a poorer quality of life in these areas.  A difference of  2-3 points is a clinically meaningful difference.  A  difference of 2-3 points in the total score of the Quality of Life Index has been associated with significant improvement in overall quality of life, self-image, physical symptoms, and general health in studies assessing change in quality of life.  PHQ-9: Recent Review Flowsheet Data    Depression screen [facility] 2/9 12/29/2017 07/01/2017   Decreased Interest 0 2   Down, Depressed, Hopeless 1 2   PHQ - 2 Score 1 4   Altered sleeping - 1   Tired, decreased energy - 1   Change in appetite - 2   Feeling bad or failure about yourself  - 1   Trouble concentrating - 1   Moving slowly or fidgety/restless - 1   Suicidal thoughts - 0   PHQ-9 Score - 11   Difficult doing work/chores - Somewhat difficult     Interpretation of Total Score  Total Score Depression Severity:  1-4 = Minimal depression, 5-9 = Mild depression, 10-14 = Moderate depression, 15-19 = Moderately severe depression, 20-27 = Severe depression   Psychosocial Evaluation and Intervention: Psychosocial Evaluation - 12/29/17 0942     Psychosocial Evaluation & Interventions          Interventions  Encouraged to exercise with the program and follow exercise prescription;Stress management education;Relaxation education    Comments  pt with history of depression, current health related stress and anxiety concerned about inbility to perform activites as prior to surgery.      Expected Outcomes  pt will exhibit improved outlook with good coping skills.     Continue Psychosocial Services   Follow up required by staff           Psychosocial Re-Evaluation: Psychosocial Re-Evaluation    Psychosocial Re-Evaluation    [place] Name 01/12/18 1055 02/14/18 1509 03/16/18 1633   Current issues with  None Identified;Current Anxiety/Panic  None Identified;Current Anxiety/Panic  None Identified;Current Anxiety/Panic   Comments  pt with health related anxiety demonstrates new coping skills and stress management ideas.  pt anxiety improving.    pt with health related anxiety demonstrates new coping skills and stress management ideas.  pt anxiety improving.  overall pt exhbits improved outlook.    pt with health related anxiety demonstrates new coping skills and stress management ideas.  pt anxiety improving.  overall pt exhbits improved outlook.  pt exhibits increased confidence and stress management techniques. pt recognizes stress cues.     Expected Outcomes  pt will exhibit good coping skills with positive outlook.   pt will exhibit good coping skills with positive outlook.   pt will exhibit good coping skills with positive outlook.    Interventions  Encouraged to attend Cardiac Rehabilitation for the exercise;Stress management education;Relaxation education  Encouraged to attend Cardiac Rehabilitation for the exercise;Stress management education;Relaxation education  Encouraged to attend Cardiac Rehabilitation for the exercise;Stress management education;Relaxation education   Continue Psychosocial Services   Follow up required by staff  Follow up  required by staff  Follow up required by staff          Psychosocial Discharge (Final Psychosocial Re-Evaluation): Psychosocial Re-Evaluation - 03/16/18 1633    Psychosocial Re-Evaluation          Current issues with  None Identified;Current Anxiety/Panic    Comments  pt with health related anxiety demonstrates new coping skills and stress management ideas.  pt anxiety improving.  overall pt exhbits improved outlook.  pt exhibits increased confidence and stress management techniques. pt recognizes stress cues.      Expected Outcomes  pt will exhibit good coping skills with positive outlook.     Interventions  Encouraged to attend Cardiac Rehabilitation for the exercise;Stress management education;Relaxation education    Continue Psychosocial Services   Follow up required by staff           Vocational Rehabilitation: Provide vocational rehab assistance to qualifying candidates.   Vocational Rehab Evaluation & Intervention: Vocational Rehab - 12/23/17 1232    Initial Vocational Rehab Evaluation & Intervention          Assessment shows need for Vocational Rehabilitation  No Pt works at [employer] on [address]           Education: Education Goals: Education classes will be provided on a weekly basis, covering required topics. Participant will state understanding/return demonstration of topics presented.  Learning Barriers/Preferences: Learning Barriers/Preferences - 12/23/17 1022    Learning Barriers/Preferences          Learning Barriers  Hearing    Learning Preferences  Video;Written Material;Computer/Internet;Pictoral           Education Topics: Count Your Pulse:  -Group instruction provided by verbal instruction, demonstration, patient participation and written materials to support subject.  Instructors address importance of being able to find your pulse and how to count your pulse when at home without a heart monitor.  Patients get hands on experience counting  their pulse with staff help and individually.   Heart Attack, Angina, and Risk Factor Modification:  -Group instruction provided by verbal instruction, video, and written materials to support subject.  Instructors address signs and symptoms of angina and heart attacks.    Also discuss risk factors for heart disease and how to make changes to improve heart health risk factors.   Functional Fitness:  -Group instruction provided by verbal instruction, demonstration, patient participation, and written materials to support subject.  Instructors address safety measures for doing things around the house.  Discuss how to get up and down off the floor, how to pick things up properly, how to safely get out of a chair without assistance, and balance training.   Meditation and Mindfulness:  -Group instruction provided by verbal instruction, patient participation, and written materials to support subject.  Instructor addresses importance of mindfulness and meditation practice to help reduce stress and improve awareness.  Instructor also leads participants through a meditation exercise.    Stretching for Flexibility and Mobility:  -Group instruction provided by verbal instruction, patient participation, and written materials to support subject.  Instructors lead participants through series of stretches that are designed to increase flexibility thus improving mobility.  These stretches are additional exercise for major muscle groups that are typically performed during regular warm up and cool down.   Hands Only CPR:  -Group verbal, video, and participation provides a basic overview of AHA guidelines for community CPR. Role-play of emergencies allow participants the opportunity to practice calling for help and chest compression technique with discussion of AED use.   Hypertension: -Group verbal and written instruction that provides a basic overview of hypertension including the most recent diagnostic  guidelines, risk factor reduction with self-care instructions and medication management.    Nutrition I class: Heart Healthy Eating:  -Group instruction provided by PowerPoint slides, verbal discussion, and written materials to support subject matter. The instructor gives an explanation and review of the Therapeutic Lifestyle Changes diet recommendations, which includes a discussion on lipid goals, dietary fat, sodium, fiber, plant stanol/sterol esters, sugar, and the components of a well-balanced, healthy diet. [facility]  REHAB PHASE II EXERCISE from 03/02/2018 in [place]  Date  01/04/18  Educator  RD  Instruction Review Code  2- Demonstrated Understanding      Nutrition II class: Lifestyle Skills:  -Group instruction provided by PowerPoint slides, verbal discussion, and written materials to support subject matter. The instructor gives an explanation and review of label reading, grocery shopping for heart health, heart healthy recipe modifications, and ways to make healthier choices when eating out. Flowsheet Row CARDIAC REHAB PHASE II EXERCISE from 03/02/2018 in [place]  Date  01/18/18  Educator  RD  Instruction Review Code  2- Demonstrated Understanding      Diabetes Question & Answer:  -Group instruction provided by PowerPoint slides, verbal discussion, and written materials to support subject matter. The instructor gives an explanation and review of diabetes co-morbidities, pre- and post-prandial blood glucose goals, pre-exercise blood glucose goals, signs, symptoms, and treatment of hypoglycemia and hyperglycemia, and foot care basics. Flowsheet Row CARDIAC REHAB PHASE II EXERCISE from 03/02/2018 in [place]  Date  01/21/18  Educator  RD  Instruction Review Code  2- Demonstrated Understanding      Diabetes Blitz:  -Group instruction provided by PowerPoint slides, verbal  discussion, and written materials to support subject matter. The instructor gives an explanation and review of the physiology behind type 1 and type 2 diabetes, diabetes medications and rational behind using different medications, pre- and post-prandial blood glucose recommendations and Hemoglobin A1c goals, diabetes diet, and exercise including blood glucose guidelines for exercising safely.  Flowsheet Row CARDIAC REHAB PHASE II EXERCISE from 03/02/2018 in [place]  Date  02/01/18  Educator  RD  Instruction Review Code  2- Demonstrated Understanding      Portion Distortion:  -Group instruction provided by PowerPoint slides, verbal discussion, written materials, and food models to support subject matter. The instructor gives an explanation of serving size versus portion size, changes in portions sizes over the last 20 years, and what consists of a serving from each food group. Flowsheet Row CARDIAC REHAB PHASE II EXERCISE from 03/02/2018 in [place]  Date  02/23/18  Educator  RD  Instruction Review Code  2- Demonstrated Understanding      Stress Management:  -Group instruction provided by verbal instruction, video, and written materials to support subject matter.  Instructors review role of stress in heart disease and how to cope with stress positively.   Flowsheet Row CARDIAC REHAB PHASE II EXERCISE from 03/02/2018 in [place]  Date  03/02/18  Instruction Review Code  2- Demonstrated Understanding      Exercising on Your Own:  -Group instruction provided by verbal instruction, power point, and written materials to support subject.  Instructors discuss benefits of exercise, components of exercise, frequency and intensity of exercise, and end points for exercise.  Also discuss use of nitroglycerin and activating EMS.  Review options of places to exercise outside of rehab.  Review guidelines for  sex with heart disease.   Cardiac Drugs I:  -Group instruction provided by verbal instruction and written materials to support subject.  Instructor reviews cardiac drug classes: antiplatelets, anticoagulants, beta blockers, and statins.  Instructor discusses reasons, side effects, and lifestyle considerations for each drug class.   Cardiac Drugs II:  -Group instruction provided by verbal instruction and written materials to support subject.  Instructor reviews cardiac drug classes:  angiotensin converting enzyme inhibitors (ACE-I), angiotensin II receptor blockers (ARBs), nitrates, and calcium channel blockers.  Instructor discusses reasons, side effects, and lifestyle considerations for each drug class. Flowsheet Row CARDIAC REHAB PHASE II EXERCISE from 03/02/2018 in [place]  Date  01/12/18  Instruction Review Code  2- Demonstrated Understanding      Anatomy and Physiology of the Circulatory System:  Group verbal and written instruction and models provide basic cardiac anatomy and physiology, with the coronary electrical and arterial systems. Review of: AMI, Angina, Valve disease, Heart Failure, Peripheral Artery Disease, Cardiac Arrhythmia, Pacemakers, and the ICD. Flowsheet Row CARDIAC REHAB PHASE II EXERCISE from 03/02/2018 in [place]  Date  01/19/18  Instruction Review Code  2- Demonstrated Understanding      Other Education:  -Group or individual verbal, written, or video instructions that support the educational goals of the cardiac rehab program.   Holiday Eating Survival Tips:  -Group instruction provided by PowerPoint slides, verbal discussion, and written materials to support subject matter. The instructor gives patients tips, tricks, and techniques to help them not only survive but enjoy the holidays despite the onslaught of food that accompanies the holidays.   Knowledge Questionnaire Score: Knowledge  Questionnaire Score - 12/23/17 1127    Knowledge Questionnaire Score          Pre Score  20/24           Core Components/Risk Factors/Patient Goals at Admission: Personal Goals and Risk Factors at Admission - 12/29/17 0936    Core Components/Risk Factors/Patient Goals on Admission          Tobacco Cessation  Yes    Intervention  Assist the participant in steps to quit. Provide individualized education and counseling about committing to Tobacco Cessation, relapse prevention, and pharmacological support that can be provided by physician.;Advice worker, assist with locating and accessing local/national Quit Smoking programs, and support quit date choice.    Expected Outcomes  Long Term: Complete abstinence from all tobacco products for at least 12 months from quit date.           Core Components/Risk Factors/Patient Goals Review:  Goals and Risk Factor Review    Core Components/Risk Factors/Patient Goals Review    Row Name 12/29/17 0939 01/12/18 1051 02/14/18 1507 03/16/18 1632   Personal Goals Review  Weight Management/Obesity;Tobacco Cessation;Hypertension;Lipids;Stress  Weight Management/Obesity;Tobacco Cessation;Hypertension;Lipids;Stress  Weight Management/Obesity;Tobacco Cessation;Hypertension;Lipids;Stress  Weight Management/Obesity;Tobacco Cessation;Hypertension;Lipids;Stress   Review  pt with multiple CAD RF demonstrates eagerness to participate in CR program. pt working with [facility] on tobacco cessation, which she has maintained since surgery. pt given fake cigarette today for hand/mouth association. pt concerned about current low strength/stamina and eager to increase both prior to returning to work at [employer] in 4 weeks.   pt with multiple CAD RF demonstrates eagerness to participate in CR program. pt admits she has slipped and smoked a few cigarettes as stress reliever. pt verbalizes plan to avoid slipping again.  pt given fake cigarette today  for hand/mouth association. pt voices continued concerns  about current low strength/stamina and eager to increase both prior to returning to work at [employer] in a few  weeks., however confidence is increasing.   pt with multiple CAD RF demonstrates eagerness to participate in CR program. pt admits she has slipped and smoked a few cigarettes as stress reliever. pt is working hard to completely stop.  pt verbalizes plan to avoid  slipping again.  pt has enjoyed weight training. pt feels more confident with her abilty to return to work with reduced hours per MD recommendation.    pt with multiple CAD RF demonstrates eagerness to participate in CR program. pt has successfully returned back to work without difficulty. pt has slowly resumed previous work duties. 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     Expected Outcomes  pt will participate in CR exercise, nutrition and lifestyle modification to decrease overall RF.    pt will participate in CR exercise, nutrition and lifestyle modification to decrease overall RF.    pt will participate in CR exercise, nutrition and lifestyle modification to decrease overall RF.    pt will participate in CR exercise, nutrition and lifestyle modification to decrease overall RF.            Core Components/Risk Factors/Patient Goals at Discharge (Final Review):  Goals and Risk Factor Review - 03/16/18 1632    Core Components/Risk Factors/Patient Goals Review          Personal Goals Review  Weight Management/Obesity;Tobacco Cessation;Hypertension;Lipids;Stress    Review  pt with multiple CAD RF demonstrates eagerness to participate in CR program. pt has successfully returned back to work without difficulty. pt has slowly resumed previous work duties. 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      Expected Outcomes  pt will participate in CR exercise, nutrition and lifestyle modification to decrease overall RF.              ITP Comments: ITP Comments    Row Name 12/23/17 1018 12/29/17 0930 01/11/18 1331 02/14/18 1507 03/16/18 1631   ITP Comments  Dr. Fransico Him, Medical Director  pt started group exercise sessions today. pt tolerated light activity without difficulty.    30 day ITP review.  pt exhibits eagerness to participate in CR group exercise setting. pt with good attendance and participation.   30 day ITP review.  pt exhibits eagerness to participate in CR group exercise setting. pt with good attendance and participation.   30 day ITP review.  pt exhibits eagerness to participate in CR group exercise setting. pt with good attendance and participation.       Comments:  Andi Hence, RN, BSN Cardiac Pulmonary Rehab 03/17/18 2:06 PM

## 2018-03-18 ENCOUNTER — Encounter (HOSPITAL_COMMUNITY)
Admission: RE | Admit: 2018-03-18 | Discharge: 2018-03-18 | Disposition: A | Discharge status: Home / Self Care | Payer: Managed Care, Other (non HMO) | Source: Ambulatory Visit | Attending: Cardiology | Admitting: Cardiology

## 2018-03-18 DIAGNOSIS — Z952 Presence of prosthetic heart valve: Secondary | ICD-10-CM

## 2018-03-18 DIAGNOSIS — Z954 Presence of other heart-valve replacement: Secondary | ICD-10-CM | POA: Diagnosis not present

## 2018-03-21 ENCOUNTER — Encounter (HOSPITAL_COMMUNITY)
Admission: RE | Admit: 2018-03-21 | Discharge: 2018-03-21 | Disposition: A | Discharge status: Home / Self Care | Payer: Managed Care, Other (non HMO) | Source: Ambulatory Visit | Attending: Cardiology | Admitting: Cardiology

## 2018-03-21 DIAGNOSIS — Z954 Presence of other heart-valve replacement: Secondary | ICD-10-CM | POA: Diagnosis not present

## 2018-03-21 DIAGNOSIS — Z952 Presence of prosthetic heart valve: Secondary | ICD-10-CM

## 2018-03-22 ENCOUNTER — Other Ambulatory Visit: Payer: Self-pay | Admitting: Family Medicine

## 2018-03-24 ENCOUNTER — Ambulatory Visit (INDEPENDENT_AMBULATORY_CARE_PROVIDER_SITE_OTHER): Payer: Managed Care, Other (non HMO) | Admitting: *Deleted

## 2018-03-24 DIAGNOSIS — Z5181 Encounter for therapeutic drug level monitoring: Secondary | ICD-10-CM

## 2018-03-24 DIAGNOSIS — Q230 Congenital stenosis of aortic valve: Secondary | ICD-10-CM

## 2018-03-24 DIAGNOSIS — Z7901 Long term (current) use of anticoagulants: Secondary | ICD-10-CM

## 2018-03-24 DIAGNOSIS — Z954 Presence of other heart-valve replacement: Secondary | ICD-10-CM

## 2018-03-24 DIAGNOSIS — Q231 Congenital insufficiency of aortic valve: Secondary | ICD-10-CM | POA: Diagnosis not present

## 2018-03-24 LAB — POCT INR: INR: 3.5

## 2018-03-24 NOTE — Patient Instructions (Signed)
Description   Do not take coumadin tomorrow April 26th as she has taken today then continue taking 1 tablet daily except 1.5 tablets on Sundays, Tuesdays and Thursdays.  Recheck in 2 weeks.  [facility] [phone] Main [phone]

## 2018-03-28 ENCOUNTER — Encounter (HOSPITAL_COMMUNITY)
Admission: RE | Admit: 2018-03-28 | Discharge: 2018-03-28 | Disposition: A | Discharge status: Home / Self Care | Payer: Managed Care, Other (non HMO) | Source: Ambulatory Visit | Attending: Cardiology | Admitting: Cardiology

## 2018-03-28 DIAGNOSIS — Z954 Presence of other heart-valve replacement: Secondary | ICD-10-CM | POA: Diagnosis not present

## 2018-03-28 DIAGNOSIS — Z952 Presence of prosthetic heart valve: Secondary | ICD-10-CM

## 2018-03-30 ENCOUNTER — Encounter (HOSPITAL_COMMUNITY)
Admission: RE | Admit: 2018-03-30 | Discharge: 2018-03-30 | Disposition: A | Discharge status: Home / Self Care | Payer: Managed Care, Other (non HMO) | Source: Ambulatory Visit | Attending: Cardiology | Admitting: Cardiology

## 2018-03-30 VITALS — Ht 60.0 in | Wt 124.8 lb

## 2018-03-30 DIAGNOSIS — Z954 Presence of other heart-valve replacement: Secondary | ICD-10-CM | POA: Insufficient documentation

## 2018-03-30 DIAGNOSIS — Z952 Presence of prosthetic heart valve: Secondary | ICD-10-CM

## 2018-04-01 ENCOUNTER — Encounter (HOSPITAL_COMMUNITY)
Admission: RE | Admit: 2018-04-01 | Discharge: 2018-04-01 | Disposition: A | Discharge status: Home / Self Care | Payer: Managed Care, Other (non HMO) | Source: Ambulatory Visit | Attending: Cardiology | Admitting: Cardiology

## 2018-04-01 DIAGNOSIS — Z952 Presence of prosthetic heart valve: Secondary | ICD-10-CM

## 2018-04-01 DIAGNOSIS — Z954 Presence of other heart-valve replacement: Secondary | ICD-10-CM | POA: Diagnosis not present

## 2018-04-06 ENCOUNTER — Encounter (HOSPITAL_COMMUNITY)
Admission: RE | Admit: 2018-04-06 | Discharge: 2018-04-06 | Disposition: A | Discharge status: Home / Self Care | Payer: Managed Care, Other (non HMO) | Source: Ambulatory Visit | Attending: Cardiology | Admitting: Cardiology

## 2018-04-06 DIAGNOSIS — Z954 Presence of other heart-valve replacement: Secondary | ICD-10-CM | POA: Diagnosis not present

## 2018-04-06 DIAGNOSIS — Z952 Presence of prosthetic heart valve: Secondary | ICD-10-CM

## 2018-04-07 ENCOUNTER — Other Ambulatory Visit: Payer: Self-pay | Admitting: Cardiology

## 2018-04-07 ENCOUNTER — Ambulatory Visit (INDEPENDENT_AMBULATORY_CARE_PROVIDER_SITE_OTHER): Payer: Managed Care, Other (non HMO) | Admitting: *Deleted

## 2018-04-07 DIAGNOSIS — Z954 Presence of other heart-valve replacement: Secondary | ICD-10-CM | POA: Diagnosis not present

## 2018-04-07 DIAGNOSIS — Z7901 Long term (current) use of anticoagulants: Secondary | ICD-10-CM

## 2018-04-07 DIAGNOSIS — Q231 Congenital insufficiency of aortic valve: Secondary | ICD-10-CM

## 2018-04-07 DIAGNOSIS — Q230 Congenital stenosis of aortic valve: Secondary | ICD-10-CM

## 2018-04-07 LAB — POCT INR: INR: 2.9

## 2018-04-07 NOTE — Patient Instructions (Signed)
Description   Continue taking 1 tablet daily except 1.5 tablets on Sundays, Tuesdays and Thursdays.  Recheck in 2 weeks.  Coumadin Clinic [phone] Main [phone]

## 2018-04-10 ENCOUNTER — Other Ambulatory Visit: Payer: Self-pay | Admitting: Cardiology

## 2018-04-11 ENCOUNTER — Encounter (HOSPITAL_COMMUNITY)
Admission: RE | Admit: 2018-04-11 | Discharge: 2018-04-11 | Disposition: A | Discharge status: Home / Self Care | Payer: Managed Care, Other (non HMO) | Source: Ambulatory Visit | Attending: Cardiology | Admitting: Cardiology

## 2018-04-11 DIAGNOSIS — Z952 Presence of prosthetic heart valve: Secondary | ICD-10-CM

## 2018-04-11 DIAGNOSIS — Z954 Presence of other heart-valve replacement: Secondary | ICD-10-CM | POA: Diagnosis not present

## 2018-04-14 ENCOUNTER — Encounter (HOSPITAL_COMMUNITY): Payer: Self-pay

## 2018-04-14 NOTE — Progress Notes (Signed)
Cardiac Individual Treatment Plan  Patient Details  Name: Danielle Obrien MRN: [id number] Date of Birth: Jan 05, 1981 Referring Provider:   Flowsheet Row CARDIAC REHAB PHASE II ORIENTATION from 12/23/2017 in [facility]  Referring Provider  Fransico Him MD      Initial Encounter Date:  [place] from 12/23/2017 in [facility]  Date  12/23/17  Referring Provider  Fransico Him MD      Visit Diagnosis: S/P AVR (aortic valve replacement)  Patient's Home Medications on Admission:  Current Outpatient Medications:  .  albuterol (PROVENTIL HFA;VENTOLIN HFA) 108 (90 Base) MCG/ACT inhaler, Inhale 2 puffs into the lungs every 6 (six) hours as needed for wheezing or shortness of breath., Disp: 1 Inhaler, Rfl: 3 .  aspirin EC 81 MG tablet, Take 1 tablet (81 mg total) by mouth daily., Disp: 90 tablet, Rfl: 3 .  Calcium Carbonate-Vitamin D (CALCIUM 600/VITAMIN D PO), Take 1 tablet by mouth daily., Disp: , Rfl:  .  gabapentin (NEURONTIN) 300 MG capsule, TAKE 2 CAPSULES BY MOUTH THREE TIMES A DAY, Disp: 360 capsule, Rfl: 1 .  lamoTRIgine (LAMICTAL) 200 MG tablet, TAKE 2 TABLETS (400 MG TOTAL) BY MOUTH DAILY. (Patient taking differently: Take 400 mg by mouth daily. ), Disp: 180 tablet, Rfl: 2 .  lansoprazole (PREVACID) 15 MG capsule, Take 15 mg by mouth daily as needed (for heartburn or acid reflux). , Disp: , Rfl:  .  loratadine (LORADAMED) 10 MG tablet, Take 10 mg by mouth daily., Disp: , Rfl:  .  metoprolol tartrate (LOPRESSOR) 25 MG tablet, Take 0.5 tablets (12.5 mg total) by mouth 2 (two) times daily., Disp: 60 tablet, Rfl: 1 .  promethazine-dextromethorphan (PROMETHAZINE-DM) 6.25-15 MG/5ML syrup, Take 5 mLs by mouth 3 (three) times daily as needed for cough., Disp: 240 mL, Rfl: 0 .  Tetrahydrozoline HCl (VISINE OP), Place 1 drop into both eyes at bedtime as needed (for allergies)., Disp: , Rfl:  .   traMADol (ULTRAM) 50 MG tablet, TAKE 1 TABLET BY MOUTH EVERY 12 HOURS AS NEEDED, Disp: 60 tablet, Rfl: 0 .  traMADol (ULTRAM) 50 MG tablet, Take 2 tablets (100 mg total) by mouth as needed. In addition to current dosage. Take at [facility] daily as needed for pain, Disp: 10 tablet, Rfl: 0 .  warfarin (COUMADIN) 7.5 MG tablet, Take as directed by [facility], Disp: 40 tablet, Rfl: 1 .  warfarin (COUMADIN) 7.5 MG tablet, TAKE ONE TABLET BY MOUTH DAILY AT 6:00 P.M., Disp: 40 tablet, Rfl: 2  Past Medical History: Past Medical History:  Diagnosis Date  . Abnormal Pap smear of cervix    --recurrent ascus w/Pos. HR HPV  . ADD (attention deficit disorder)   . Alcohol abuse, in remission 2012  . Allergy   . Anxiety   . Aortic stenosis   . Aortic stenosis due to bicuspid aortic valve 02/20/2014  . Arthritis    In hips  . Asthma    rare inhaler use  . Bicuspid aortic valve    moderate AS by echo 08/2015 with mean AVG 89mmHg and AVA 1.1cm2  . Bipolar disorder ([place])   . Headache   . Hyperlipidemia with target LDL less than 130 01/27/2013  . Hypertension   . Muscle spasms of both lower extremities    both hips  . 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 Tobacco abuse 09/27/2015  . 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 Packs/day: 0.50  . Years: 12.00  . Pack years: 6.00  . Types: Cigarettes  . Last attempt to quit: 11/02/2017  . Years since quitting: 0.4  Smokeless Tobacco Never Used  Tobacco Comment   pt given fake cigarette for hand/mouth association. pt using [place] for support    Labs: Recent Review Flowsheet Data    Labs for ITP Cardiac and Pulmonary Rehab Latest Ref Rng & Units 11/03/2017  11/03/2017 11/03/2017 11/03/2017 11/04/2017   Cholestrol 0 - 200 mg/dL - - - - -   LDLCALC 0 - 99 mg/dL - - - - -   LDLDIRECT mg/dL - - - - -   HDL >39.00 mg/dL - - - - -   Trlycerides 0.0 - 149.0 mg/dL - - - - -   Hemoglobin A1c 4.8 - 5.6 % - - - - -   PHART 7.350 - 7.450 7.312(L) 7.305(L) 7.317(L) - 7.381   PCO2ART 32.0 - 48.0 mmHg 42.7 43.5 36.7 - 33.9   HCO3 20.0 - 28.0 mmol/L 21.6 21.9 18.7(L) - 19.7(L)   TCO2 22 - 32 mmol/L 23 23 20(L) 20(L) 27   ACIDBASEDEF 0.0 - 2.0 mmol/L 4.0(H) 5.0(H) 7.0(H) - 4.5(H)   O2SAT % 97.0 98.0 99.0 - 90.8      Capillary Blood Glucose: Lab Results  Component Value Date   GLUCAP 131 (H) 11/05/2017   GLUCAP 89 11/05/2017   GLUCAP 128 (H) 11/05/2017   GLUCAP 107 (H) 11/05/2017   GLUCAP 148 (H) 11/04/2017     Exercise Target Goals:    Exercise Program Goal: Individual exercise prescription set using results from initial 6 min walk test and THRR while considering  patient's activity barriers and safety.   Exercise Prescription Goal: Initial exercise prescription builds to 30-45 minutes a day of aerobic activity, 2-3 days per week.  Home exercise guidelines will be given to patient during program as part of exercise prescription that the participant will acknowledge.  Activity Barriers & Risk Stratification: Activity Barriers & Cardiac Risk Stratification - 12/23/17 1113    Activity Barriers & Cardiac Risk Stratification          Comments  leg length discrepency, R sided upper extremity limitations, L ACL surgery             6 Minute Walk: 6 Minute Walk    6 Minute Walk    Row Name 12/23/17 1117 04/05/18 1624   Phase  Initial  Discharge   Distance  1800 feet  1957 feet   Distance % Change  no documentation  8.72 %   Distance Feet Change  no documentation  157 ft   Walk Time  6 minutes  6 minutes   # of Rest Breaks  0  0   MPH  3.4  3.7   METS  5.6  6   RPE  12  10   Perceived Dyspnea   1  0   VO2 Peak  19.53  21.1   Symptoms  Yes  (comment)  No   Comments  mild SOB  no documentation   Resting HR  81 bpm  95 bpm   Resting BP  104/60  102/70   Resting Oxygen Saturation  97 %  no documentation   Exercise Oxygen Saturation  during 6 min walk  98 %  no documentation   Max Ex. HR  99 bpm  117 bpm   Max Ex. BP  124/82  132/76   2 Minute Post BP  118/70  96/64          Oxygen Initial Assessment:   Oxygen Re-Evaluation:   Oxygen Discharge (Final Oxygen Re-Evaluation):   Initial Exercise Prescription: Initial Exercise Prescription - 12/23/17 1100    Date of Initial Exercise RX and Referring Provider          Date  12/23/17    Referring Provider  Fransico Him MD        Treadmill          MPH  2.4    Grade  0    Minutes  10    METs  2.84        Bike          Level  0.5    Minutes  10    METs  2.72        NuStep          Level  3    SPM  80    Minutes  10    METs  2.4        Prescription Details          Frequency (times per week)  3    Duration  Progress to 30 minutes of continuous aerobic without signs/symptoms of physical distress        Intensity          THRR 40-80% of Max Heartrate  73-146    Ratings of Perceived Exertion  11-15    Perceived Dyspnea  0-4        Progression          Progression  Continue to progress workloads to maintain intensity without signs/symptoms of physical distress.        Resistance Training          Training Prescription  Yes    Weight  2lbs    Reps  10-15           Perform Capillary Blood Glucose checks as needed.  Exercise Prescription Changes: Exercise Prescription Changes    Response to Exercise    Row Name 12/29/17 1600 01/17/18 1200 01/31/18 1626 02/14/18 1500 02/28/18 1323   Blood Pressure (Admit)  102/64  112/76  110/84  112/64  120/70   Blood Pressure (Exercise)  134/76  128/62  110/60  120/60  124/84   Blood Pressure (Exit)  104/60  106/68  98/64  114/70  116/78   Heart Rate (Admit)  93 bpm  88 bpm  88 bpm  77 bpm  89 bpm    Heart Rate (Exercise)  103 bpm  123 bpm  120 bpm  109 bpm  115 bpm   Heart Rate (Exit)  82 bpm  86 bpm  88 bpm  76 bpm  92 bpm   Rating of Perceived Exertion (Exercise)  12  12  12  12  12    Symptoms  none  none  none  none  none   Comments  pt oriented to exercise equipment 12/29/17  pt oriented to exercise equipment 12/29/17  pt uses weight cable column machine for weights as an exercise station  pt uses weight cable column machine for weights as an exercise station  pt uses weight cable  column machine for weights as an exercise station   Duration  Continue with 30 min of aerobic exercise without signs/symptoms of physical distress.  Continue with 30 min of aerobic exercise without signs/symptoms of physical distress.  Continue with 30 min of aerobic exercise without signs/symptoms of physical distress.  Continue with 30 min of aerobic exercise without signs/symptoms of physical distress.  Continue with 30 min of aerobic exercise without signs/symptoms of physical distress.   Intensity  THRR unchanged  THRR unchanged  THRR unchanged  THRR unchanged  THRR unchanged       Progression    Row Name 12/29/17 1600 01/17/18 1200 01/31/18 1626 02/14/18 1500 02/28/18 1323   Progression  Continue to progress workloads to maintain intensity without signs/symptoms of physical distress.  Continue to progress workloads to maintain intensity without signs/symptoms of physical distress.  Continue to progress workloads to maintain intensity without signs/symptoms of physical distress.  Continue to progress workloads to maintain intensity without signs/symptoms of physical distress.  Continue to progress workloads to maintain intensity without signs/symptoms of physical distress.   Average METs  3.1  6.9  5.5  5.5  5.5       Resistance Training    Row Name 12/29/17 1600 01/17/18 1200 01/31/18 1626 02/14/18 1500 02/28/18 1323   Training Prescription  No Relaxation day  No  Yes pt uses weight cable column machine for  weights  Yes pt uses weight cable column machine for weights  Yes pt uses weight cable column machine for weights   Weight  no documentation  no documentation  30-40  40-50  40-50   Reps  no documentation  no documentation  10-15 2-3sets  10-15 2-3sets  10-15 2-3sets   Time  no documentation  no documentation  20 Minutes  20 Minutes  20 Minutes       Treadmill    Row Name 12/29/17 1600 01/17/18 1200 01/31/18 1626 02/14/18 1500 02/28/18 1323   MPH  2.4  3.3  3.4  3.4  3.4   Grade  0  3  4  4  4    Minutes  10  10  10  15  15    METs  2.84  4.77  5.48  5.48  5.48       Bike    Row Name 12/29/17 1600 01/17/18 1200 01/31/18 1626 02/14/18 1500 02/28/18 1323   Level  2 upright scifit  4.5 upright scifit  no documentation  no documentation  no documentation   Minutes  10  10  no documentation  no documentation  no documentation   METs  4.1  7.1  no documentation  no documentation  no documentation       [place] Name 12/29/17 1600 01/17/18 1200 01/31/18 1626 02/14/18 1500 02/28/18 1323   Level  3  4  no documentation  no documentation  no documentation   SPM  80  100  no documentation  no documentation  no documentation   Minutes  10  10  no documentation  no documentation  no documentation   METs  2.3  7.1  no documentation  no documentation  no documentation       Home Exercise Plan    [place] Name 12/29/17 1600 01/17/18 1200 01/31/18 1626 02/14/18 1500 02/28/18 1323   Plans to continue exercise at  no documentation  Home (comment) walking  Home (comment) walking  Home (comment) walking  Home (comment) walking   Frequency  no documentation  Add 2 additional days to program exercise sessions.  Add 2 additional days to program exercise sessions.  Add 2 additional days to program exercise sessions.  Add 2 additional days to program exercise sessions.   Initial Home Exercises Provided  no documentation  01/14/18  01/14/18  01/14/18  01/14/18       Response to Exercise    Row Name 03/14/18  1631 03/30/18 1200 04/11/18 1115   Blood Pressure (Admit)  106/72  102/72  98/64   Blood Pressure (Exercise)  118/60  132/76  100/60   Blood Pressure (Exit)  110/72  96/64  104/60   Heart Rate (Admit)  79 bpm  95 bpm  85 bpm   Heart Rate (Exercise)  107 bpm  117 bpm  90 bpm   Heart Rate (Exit)  77 bpm  80 bpm  72 bpm   Rating of Perceived Exertion (Exercise)  11  11  13    Symptoms  none  none  none   Comments  pt uses weight cable column machine for weights as an exercise station  pt uses weight cable column machine for weights as an exercise station  late start   Duration  Continue with 30 min of aerobic exercise without signs/symptoms of physical distress.  Continue with 30 min of aerobic exercise without signs/symptoms of physical distress.  Continue with 30 min of aerobic exercise without signs/symptoms of physical distress.   Intensity  THRR unchanged  THRR unchanged  THRR unchanged       Progression    Row Name 03/14/18 1631 03/30/18 1200 04/11/18 1115   Progression  Continue to progress workloads to maintain intensity without signs/symptoms of physical distress.  Continue to progress workloads to maintain intensity without signs/symptoms of physical distress.  Continue to progress workloads to maintain intensity without signs/symptoms of physical distress.   Average METs  5.5  5.5  4.7       Resistance Training    Row Name 03/14/18 1631 03/30/18 1200 04/11/18 1115   Training Prescription  Yes pt uses weight cable column machine for weights  Yes pt uses weight cable column machine for weights  Yes handheld weights   Weight  40-50  40-50  6lbs   Reps  10-15 2-3sets  10-15 2-3sets  10-15 2-3sets   Time  20 Minutes  20 Minutes  10 Minutes       Treadmill    Row Name 03/14/18 1631 03/30/18 1200 04/11/18 1115   MPH  3.4  3.4  no documentation   Grade  4  4  no documentation   Minutes  15  10  no documentation   METs  5.48  5.48  no documentation       [place] Name 03/14/18  1631 03/30/18 1200 04/11/18 1115   Level  no documentation  no documentation  3   Watts  no documentation  no documentation  15   Minutes  no documentation  no documentation  20   METs  no documentation  no documentation  4.7       Home Exercise Plan    [place] Name 03/14/18 1631 03/30/18 1200 04/11/18 1115   Plans to continue exercise at  Home (comment) walking  Home (comment) walking  Home (comment) walking   Frequency  Add 2 additional days to program exercise sessions.  Add 2 additional days to program exercise sessions.  Add 2 additional days to program exercise sessions.  Initial Home Exercises Provided  01/14/18  01/14/18  01/14/18          Exercise Comments: Exercise Comments    Row Name 12/29/17 1623 01/13/18 1240 02/14/18 1501 03/15/18 1449 04/12/18 1530   Exercise Comments  Pt completed first session of cardiac rehab today, Pt responded well to exercise prescription. Pt denied symptoms of SOB, dizziness and CP.  Pt will continue to be monitored and exercise program will be progressed as tolerated.  Reviewed METs and goals. Pt is tolerating exercise very well; will continue to monitor activity levels and progress exercise as tolerated  Reviewed METs and goals. Pt is tolerating exercise very well; will continue to monitor activity levels and progress exercise as tolerated  Reviewed METs and goals. Pt is tolerating exercise very well; will continue to monitor activity levels and progress exercise as tolerated  Reviewed METs and goals. Pt is tolerating exercise very well; will continue to monitor activity levels and progress exercise as tolerated      Exercise Goals and Review: Exercise Goals    Exercise Goals    Row Name 12/23/17 1118   Increase Physical Activity  Yes   Intervention  Provide advice, education, support and counseling about physical activity/exercise needs.;Develop an individualized exercise prescription for aerobic and resistive training based on initial evaluation  findings, risk stratification, comorbidities and participant's personal goals.   Expected Outcomes  Short Term: Attend rehab on a regular basis to increase amount of physical activity.;Long Term: Add in home exercise to make exercise part of routine and to increase amount of physical activity.;Long Term: Exercising regularly at least 3-5 days a week.   Increase Strength and Stamina  Yes   Intervention  Provide advice, education, support and counseling about physical activity/exercise needs.;Develop an individualized exercise prescription for aerobic and resistive training based on initial evaluation findings, risk stratification, comorbidities and participant's personal goals.   Expected Outcomes  Short Term: Perform resistance training exercises routinely during rehab and add in resistance training at home;Short Term: Increase workloads from initial exercise prescription for resistance, speed, and METs.;Long Term: Improve cardiorespiratory fitness, muscular endurance and strength as measured by increased METs and functional capacity (6MWT)   Able to understand and use rate of perceived exertion (RPE) scale  Yes   Intervention  Provide education and explanation on how to use RPE scale   Expected Outcomes  Short Term: Able to use RPE daily in rehab to express subjective intensity level;Long Term:  Able to use RPE to guide intensity level when exercising independently   Knowledge and understanding of Target Heart Rate Range (THRR)  Yes   Intervention  Provide education and explanation of THRR including how the numbers were predicted and where they are located for reference   Expected Outcomes  Short Term: Able to state/look up THRR;Short Term: Able to use daily as guideline for intensity in rehab;Long Term: Able to use THRR to govern intensity when exercising independently   Able to check pulse independently  Yes   Intervention  Provide education and demonstration on how to check pulse in carotid and  radial arteries.;Review the importance of being able to check your own pulse for safety during independent exercise   Expected Outcomes  Short Term: Able to explain why pulse checking is important during independent exercise;Long Term: Able to check pulse independently and accurately   Understanding of Exercise Prescription  Yes   Intervention  Provide education, explanation, and written materials on patient's individual exercise prescription   Expected Outcomes  Short Term: Able to explain program exercise prescription;Long Term: Able to explain home exercise prescription to exercise independently          Exercise Goals Re-Evaluation : Exercise Goals Re-Evaluation    Exercise Goal Re-Evaluation    Row Name 01/05/18 1106 01/13/18 1238 02/14/18 1501 03/15/18 1449 04/12/18 1530   Exercise Goals Review  Increase Physical Activity;Understanding of Exercise Prescription;Increase Strength and Stamina;Knowledge and understanding of Target Heart Rate Range (THRR);Able to understand and use rate of perceived exertion (RPE) scale;Able to check pulse independently  Increase Physical Activity;Understanding of Exercise Prescription;Increase Strength and Stamina;Knowledge and understanding of Target Heart Rate Range (THRR);Able to understand and use rate of perceived exertion (RPE) scale;Able to check pulse independently  Increase Physical Activity;Understanding of Exercise Prescription;Increase Strength and Stamina;Knowledge and understanding of Target Heart Rate Range (THRR);Able to understand and use rate of perceived exertion (RPE) scale;Able to check pulse independently  Increase Physical Activity;Understanding of Exercise Prescription;Increase Strength and Stamina;Knowledge and understanding of Target Heart Rate Range (THRR);Able to understand and use rate of perceived exertion (RPE) scale;Able to check pulse independently  Increase Physical Activity;Understanding of Exercise Prescription;Increase Strength  and Stamina;Knowledge and understanding of Target Heart Rate Range (THRR);Able to understand and use rate of perceived exertion (RPE) scale;Able to check pulse independently   Comments  Reviewed home exercise with pt today.  Pt plans to walk for exercise, 2x/week in addition to coming to cardiac rehab.  Reviewed THR, pulse, RPE, sign and symptoms, and when to call 911 or MD.  Also discussed weather considerations and indoor options.  Pt voiced understanding.  Pt is walking at home for exercise for 15 minutes 2x/week. Discussed exercise progression( progress by one minute), temperature/emergency precautions.  Pt is very compliant with HEP in which she walks for 15 -20 minutes 2x/week. Pt has also increased UE strength by using cable column weight machine in cardiac rehab. Pt has also increased confidence with activity and exercise.  Pt has returned to work in which has made some modifcations to work schedule and physical demands. Pt understands limitations and when she needs to rest. Pt has improved in UE strength via cable column weights in cardiac rehab and resistance bands for home use.   Pt is back into the swing of things at work. Pt has noticed increase muscular fatigue and leg numbness due to standing stagnant for a long period of time. Demonstrated with patient stretches and pressure release exercises to help with pain and discomfort.   Expected Outcomes  Pt will continue to improve in cardiorespiratory fitness by coming to cardiac rehab and being compliant with HEP.  Pt will continue to improve in cardiorespiratory fitness by coming to cardiac rehab and being compliant with HEP.  Pt will continue to improve in cardiorespiratory fitness by coming to cardiac rehab and being compliant with HEP.  Pt will continue to improve in strength and musculoskeletal endurance in order to perform tasks on the job without fatigue.  Pt will continue to improve in strength and musculoskeletal endurance in order to perform  tasks on the job without fatigue.           Discharge Exercise Prescription (Final Exercise Prescription Changes): Exercise Prescription Changes - 04/11/18 1115    Response to Exercise          Blood Pressure (Admit)  98/64    Blood Pressure (Exercise)  100/60    Blood Pressure (Exit)  104/60    Heart Rate (Admit)  85 bpm  Heart Rate (Exercise)  90 bpm    Heart Rate (Exit)  72 bpm    Rating of Perceived Exertion (Exercise)  13    Symptoms  none    Comments  late start    Duration  Continue with 30 min of aerobic exercise without signs/symptoms of physical distress.    Intensity  THRR unchanged        Progression          Progression  Continue to progress workloads to maintain intensity without signs/symptoms of physical distress.    Average METs  4.7        Resistance Training          Training Prescription  Yes handheld weights    Weight  6lbs    Reps  10-15 2-3sets    Time  10 Minutes        [place]          Level  3    Watts  15    Minutes  20    METs  4.7        Home Exercise Plan          Plans to continue exercise at  Home (comment) walking    Frequency  Add 2 additional days to program exercise sessions.    Initial Home Exercises Provided  01/14/18           Nutrition:  Target Goals: Understanding of nutrition guidelines, daily intake of sodium 1500mg , cholesterol 200mg , calories 30% from fat and 7% or less from saturated fats, daily to have 5 or more servings of fruits and vegetables.  Biometrics: Pre Biometrics - 12/23/17 1105    Pre Biometrics          Height  5' (1.524 m)    Weight  124 lb 9 oz (56.5 kg)    Waist Circumference  30.5 inches    Hip Circumference  34.5 inches    Waist to Hip Ratio  0.88 %    BMI (Calculated)  24.33    Triceps Skinfold  19 mm    % Body Fat  31.9 %    Grip Strength  28 kg    Flexibility  19 in    Single Leg Stand  19.21 seconds          Post Biometrics - 04/05/18 1626     Post  Biometrics           Height  5' (1.524 m)    Weight  124 lb 12.5 oz (56.6 kg)    Waist Circumference  29 inches    Hip Circumference  34 inches    Waist to Hip Ratio  0.85 %    BMI (Calculated)  24.37    Triceps Skinfold  18 mm    % Body Fat  31.1 %    Grip Strength  32 kg    Flexibility  20 in    Single Leg Stand  25 seconds           Nutrition Therapy Plan and Nutrition Goals: Nutrition Therapy & Goals - 01/10/18 0945    Nutrition Therapy          Diet  Heart Healthy    Drug/Food Interactions  Coumadin/Vit K        Personal Nutrition Goals          Nutrition Goal  Wt loss of 1-2 lb/week to a wt loss goal of 5-7 lb at graduation from Cardiac Rehab.  Personal Goal #2  Pt to describe the benefit of including fruits, vegetables, whole grains, and low-fat dairy products in a heart healthy meal plan.        Intervention Plan          Intervention  Prescribe, educate and counsel regarding individualized specific dietary modifications aiming towards targeted core components such as weight, hypertension, lipid management, diabetes, heart failure and other comorbidities.    Expected Outcomes  Short Term Goal: Understand basic principles of dietary content, such as calories, fat, sodium, cholesterol and nutrients.;Long Term Goal: Adherence to prescribed nutrition plan.           Nutrition Assessments: Nutrition Assessments - 01/10/18 0944    MEDFICTS Scores          Pre Score  36           Nutrition Goals Re-Evaluation:   Nutrition Goals Re-Evaluation:   Nutrition Goals Discharge (Final Nutrition Goals Re-Evaluation):   Psychosocial: Target Goals: Acknowledge presence or absence of significant depression and/or stress, maximize coping skills, provide positive support system. Participant is able to verbalize types and ability to use techniques and skills needed for reducing stress and depression.  Initial Review & Psychosocial Screening: Initial Psych Review & Screening -  12/23/17 1229    Initial Review          Current issues with  History of Depression;Current Anxiety/Panic;Current Stress Concerns;Current Depression    Source of Stress Concerns  Unable to perform yard/household activities;Unable to participate in former interests or hobbies;Poor Coping Skills        Family Dynamics          Good Support System?  Yes Pt lives with mother and father during her recovery from surgery. Pt has bipolar and takes buspar and xanax        Barriers          Psychosocial barriers to participate in program  The patient should benefit from training in stress management and relaxation.        Screening Interventions          Interventions  Encouraged to exercise;Provide feedback about the scores to participant;To provide support and resources with identified psychosocial needs    Expected Outcomes  Short Term goal: Utilizing psychosocial counselor, staff and physician to assist with identification of specific Stressors or current issues interfering with healing process. Setting desired goal for each stressor or current issue identified.;Long Term Goal: Stressors or current issues are controlled or eliminated.;Short Term goal: Identification and review with participant of any Quality of Life or Depression concerns found by scoring the questionnaire.;Long Term goal: The participant improves quality of Life and PHQ9 Scores as seen by post scores and/or verbalization of changes           Quality of Life Scores: Quality of Life - 12/23/17 1132    Quality of Life Scores          Health/Function Pre  23.14 %    Socioeconomic Pre  23.07 %    Psych/Spiritual Pre  19.71 %    Family Pre  24 %    GLOBAL Pre  22.48 %          Scores of 19 and below usually indicate a poorer quality of life in these areas.  A difference of  2-3 points is a clinically meaningful difference.  A difference of 2-3 points in the total score of the Quality of Life Index has been associated with  significant improvement  in overall quality of life, self-image, physical symptoms, and general health in studies assessing change in quality of life.  PHQ-9: Recent Review Flowsheet Data    Depression screen [facility] 2/9 12/29/2017 07/01/2017   Decreased Interest 0 2   Down, Depressed, Hopeless 1 2   PHQ - 2 Score 1 4   Altered sleeping - 1   Tired, decreased energy - 1   Change in appetite - 2   Feeling bad or failure about yourself  - 1   Trouble concentrating - 1   Moving slowly or fidgety/restless - 1   Suicidal thoughts - 0   PHQ-9 Score - 11   Difficult doing work/chores - Somewhat difficult     Interpretation of Total Score  Total Score Depression Severity:  1-4 = Minimal depression, 5-9 = Mild depression, 10-14 = Moderate depression, 15-19 = Moderately severe depression, 20-27 = Severe depression   Psychosocial Evaluation and Intervention: Psychosocial Evaluation - 12/29/17 0942    Psychosocial Evaluation & Interventions          Interventions  Encouraged to exercise with the program and follow exercise prescription;Stress management education;Relaxation education    Comments  pt with history of depression, current health related stress and anxiety concerned about inbility to perform activites as prior to surgery.      Expected Outcomes  pt will exhibit improved outlook with good coping skills.     Continue Psychosocial Services   Follow up required by staff           Psychosocial Re-Evaluation: Psychosocial Re-Evaluation    Psychosocial Re-Evaluation    [place] Name 01/12/18 1055 02/14/18 1509 03/16/18 1633 04/14/18 1151   Current issues with  None Identified;Current Anxiety/Panic  None Identified;Current Anxiety/Panic  None Identified;Current Anxiety/Panic  None Identified;Current Anxiety/Panic   Comments  pt with health related anxiety demonstrates new coping skills and stress management ideas.  pt anxiety improving.    pt with health related anxiety demonstrates new coping  skills and stress management ideas.  pt anxiety improving.  overall pt exhbits improved outlook.    pt with health related anxiety demonstrates new coping skills and stress management ideas.  pt anxiety improving.  overall pt exhbits improved outlook.  pt exhibits increased confidence and stress management techniques. pt recognizes stress cues.    pt with health related anxiety demonstrates new coping skills and stress management ideas.  pt anxiety increased with return to work.  overall pt exhbits improved outlook.  pt exhibits increased confidence and stress management techniques. pt recognizes stress cues.     Expected Outcomes  pt will exhibit good coping skills with positive outlook.   pt will exhibit good coping skills with positive outlook.   pt will exhibit good coping skills with positive outlook.   pt will exhibit good coping skills with positive outlook.    Interventions  Encouraged to attend Cardiac Rehabilitation for the exercise;Stress management education;Relaxation education  Encouraged to attend Cardiac Rehabilitation for the exercise;Stress management education;Relaxation education  Encouraged to attend Cardiac Rehabilitation for the exercise;Stress management education;Relaxation education  Encouraged to attend Cardiac Rehabilitation for the exercise;Stress management education;Relaxation education   Continue Psychosocial Services   Follow up required by staff  Follow up required by staff  Follow up required by staff  Follow up required by staff          Psychosocial Discharge (Final Psychosocial Re-Evaluation): Psychosocial Re-Evaluation - 04/14/18 1151    Psychosocial Re-Evaluation  Current issues with  None Identified;Current Anxiety/Panic    Comments  pt with health related anxiety demonstrates new coping skills and stress management ideas.  pt anxiety increased with return to work.  overall pt exhbits improved outlook.  pt exhibits increased confidence and stress  management techniques. pt recognizes stress cues.      Expected Outcomes  pt will exhibit good coping skills with positive outlook.     Interventions  Encouraged to attend Cardiac Rehabilitation for the exercise;Stress management education;Relaxation education    Continue Psychosocial Services   Follow up required by staff           Vocational Rehabilitation: Provide vocational rehab assistance to qualifying candidates.   Vocational Rehab Evaluation & Intervention: Vocational Rehab - 12/23/17 1232    Initial Vocational Rehab Evaluation & Intervention          Assessment shows need for Vocational Rehabilitation  No Pt works at [employer] on [address]           Education: Education Goals: Education classes will be provided on a weekly basis, covering required topics. Participant will state understanding/return demonstration of topics presented.  Learning Barriers/Preferences: Learning Barriers/Preferences - 12/23/17 1022    Learning Barriers/Preferences          Learning Barriers  Hearing    Learning Preferences  Video;Written Material;Computer/Internet;Pictoral           Education Topics: Count Your Pulse:  -Group instruction provided by verbal instruction, demonstration, patient participation and written materials to support subject.  Instructors address importance of being able to find your pulse and how to count your pulse when at home without a heart monitor.  Patients get hands on experience counting their pulse with staff help and individually.   Heart Attack, Angina, and Risk Factor Modification:  -Group instruction provided by verbal instruction, video, and written materials to support subject.  Instructors address signs and symptoms of angina and heart attacks.    Also discuss risk factors for heart disease and how to make changes to improve heart health risk factors.   Functional Fitness:  -Group instruction provided by verbal instruction, demonstration,  patient participation, and written materials to support subject.  Instructors address safety measures for doing things around the house.  Discuss how to get up and down off the floor, how to pick things up properly, how to safely get out of a chair without assistance, and balance training.   Meditation and Mindfulness:  -Group instruction provided by verbal instruction, patient participation, and written materials to support subject.  Instructor addresses importance of mindfulness and meditation practice to help reduce stress and improve awareness.  Instructor also leads participants through a meditation exercise.    Stretching for Flexibility and Mobility:  -Group instruction provided by verbal instruction, patient participation, and written materials to support subject.  Instructors lead participants through series of stretches that are designed to increase flexibility thus improving mobility.  These stretches are additional exercise for major muscle groups that are typically performed during regular warm up and cool down.   Hands Only CPR:  -Group verbal, video, and participation provides a basic overview of AHA guidelines for community CPR. Role-play of emergencies allow participants the opportunity to practice calling for help and chest compression technique with discussion of AED use.   Hypertension: -Group verbal and written instruction that provides a basic overview of hypertension including the most recent diagnostic guidelines, risk factor reduction with self-care instructions and medication management.    Nutrition I  class: Heart Healthy Eating:  -Group instruction provided by PowerPoint slides, verbal discussion, and written materials to support subject matter. The instructor gives an explanation and review of the Therapeutic Lifestyle Changes diet recommendations, which includes a discussion on lipid goals, dietary fat, sodium, fiber, plant stanol/sterol esters, sugar, and the  components of a well-balanced, healthy diet. Flowsheet Row CARDIAC REHAB PHASE II EXERCISE from 03/02/2018 in [place]  Date  01/04/18  Educator  RD  Instruction Review Code  2- Demonstrated Understanding      Nutrition II class: Lifestyle Skills:  -Group instruction provided by PowerPoint slides, verbal discussion, and written materials to support subject matter. The instructor gives an explanation and review of label reading, grocery shopping for heart health, heart healthy recipe modifications, and ways to make healthier choices when eating out. Flowsheet Row CARDIAC REHAB PHASE II EXERCISE from 03/02/2018 in [place]  Date  01/18/18  Educator  RD  Instruction Review Code  2- Demonstrated Understanding      Diabetes Question & Answer:  -Group instruction provided by PowerPoint slides, verbal discussion, and written materials to support subject matter. The instructor gives an explanation and review of diabetes co-morbidities, pre- and post-prandial blood glucose goals, pre-exercise blood glucose goals, signs, symptoms, and treatment of hypoglycemia and hyperglycemia, and foot care basics. Flowsheet Row CARDIAC REHAB PHASE II EXERCISE from 03/02/2018 in [place]  Date  01/21/18  Educator  RD  Instruction Review Code  2- Demonstrated Understanding      Diabetes Blitz:  -Group instruction provided by PowerPoint slides, verbal discussion, and written materials to support subject matter. The instructor gives an explanation and review of the physiology behind type 1 and type 2 diabetes, diabetes medications and rational behind using different medications, pre- and post-prandial blood glucose recommendations and Hemoglobin A1c goals, diabetes diet, and exercise including blood glucose guidelines for exercising safely.  Flowsheet Row CARDIAC REHAB PHASE II EXERCISE from 03/02/2018 in [place]  Date  02/01/18  Educator  RD  Instruction Review Code  2- Demonstrated Understanding      Portion Distortion:  -Group instruction provided by PowerPoint slides, verbal discussion, written materials, and food models to support subject matter. The instructor gives an explanation of serving size versus portion size, changes in portions sizes over the last 20 years, and what consists of a serving from each food group. Flowsheet Row CARDIAC REHAB PHASE II EXERCISE from 03/02/2018 in [place]  Date  02/23/18  Educator  RD  Instruction Review Code  2- Demonstrated Understanding      Stress Management:  -Group instruction provided by verbal instruction, video, and written materials to support subject matter.  Instructors review role of stress in heart disease and how to cope with stress positively.   Flowsheet Row CARDIAC REHAB PHASE II EXERCISE from 03/02/2018 in [place]  Date  03/02/18  Instruction Review Code  2- Demonstrated Understanding      Exercising on Your Own:  -Group instruction provided by verbal instruction, power point, and written materials to support subject.  Instructors discuss benefits of exercise, components of exercise, frequency and intensity of exercise, and end points for exercise.  Also discuss use of nitroglycerin and activating EMS.  Review options of places to exercise outside of rehab.  Review guidelines for sex with heart disease.   Cardiac Drugs I:  -Group  instruction provided by verbal instruction and written materials to support subject.  Instructor reviews cardiac drug classes: antiplatelets, anticoagulants, beta blockers, and statins.  Instructor discusses reasons, side effects, and lifestyle considerations for each drug class.   Cardiac Drugs II:  -Group instruction provided by verbal instruction and written materials to support subject.  Instructor reviews  cardiac drug classes: angiotensin converting enzyme inhibitors (ACE-I), angiotensin II receptor blockers (ARBs), nitrates, and calcium channel blockers.  Instructor discusses reasons, side effects, and lifestyle considerations for each drug class. Flowsheet Row CARDIAC REHAB PHASE II EXERCISE from 03/02/2018 in [place]  Date  01/12/18  Instruction Review Code  2- Demonstrated Understanding      Anatomy and Physiology of the Circulatory System:  Group verbal and written instruction and models provide basic cardiac anatomy and physiology, with the coronary electrical and arterial systems. Review of: AMI, Angina, Valve disease, Heart Failure, Peripheral Artery Disease, Cardiac Arrhythmia, Pacemakers, and the ICD. Flowsheet Row CARDIAC REHAB PHASE II EXERCISE from 03/02/2018 in [place]  Date  01/19/18  Instruction Review Code  2- Demonstrated Understanding      Other Education:  -Group or individual verbal, written, or video instructions that support the educational goals of the cardiac rehab program.   Holiday Eating Survival Tips:  -Group instruction provided by PowerPoint slides, verbal discussion, and written materials to support subject matter. The instructor gives patients tips, tricks, and techniques to help them not only survive but enjoy the holidays despite the onslaught of food that accompanies the holidays.   Knowledge Questionnaire Score: Knowledge Questionnaire Score - 12/23/17 1127    Knowledge Questionnaire Score          Pre Score  20/24           Core Components/Risk Factors/Patient Goals at Admission: Personal Goals and Risk Factors at Admission - 12/29/17 0936    Core Components/Risk Factors/Patient Goals on Admission          Tobacco Cessation  Yes    Intervention  Assist the participant in steps to quit. Provide individualized education and counseling about committing to Tobacco Cessation,  relapse prevention, and pharmacological support that can be provided by physician.;Advice worker, assist with locating and accessing local/national Quit Smoking programs, and support quit date choice.    Expected Outcomes  Long Term: Complete abstinence from all tobacco products for at least 12 months from quit date.           Core Components/Risk Factors/Patient Goals Review:  Goals and Risk Factor Review    Core Components/Risk Factors/Patient Goals Review    Row Name 12/29/17 0939 01/12/18 1051 02/14/18 1507 03/16/18 1632 04/14/18 1150   Personal Goals Review  Weight Management/Obesity;Tobacco Cessation;Hypertension;Lipids;Stress  Weight Management/Obesity;Tobacco Cessation;Hypertension;Lipids;Stress  Weight Management/Obesity;Tobacco Cessation;Hypertension;Lipids;Stress  Weight Management/Obesity;Tobacco Cessation;Hypertension;Lipids;Stress  Weight Management/Obesity;Tobacco Cessation;Hypertension;Lipids;Stress   Review  pt with multiple CAD RF demonstrates eagerness to participate in CR program. pt working with [facility] on tobacco cessation, which she has maintained since surgery. pt given fake cigarette today for hand/mouth association. pt concerned about current low strength/stamina and eager to increase both prior to returning to work at [employer] in 4 weeks.   pt with multiple CAD RF demonstrates eagerness to participate in CR program. pt admits she has slipped and smoked a few cigarettes as stress reliever. pt verbalizes plan to avoid slipping again.  pt given fake cigarette today for hand/mouth association. pt voices continued concerns  about  current low strength/stamina and eager to increase both prior to returning to work at [employer] in a few  weeks., however confidence is increasing.   pt with multiple CAD RF demonstrates eagerness to participate in CR program. pt admits she has slipped and smoked a few cigarettes as stress reliever. pt is  working hard to completely stop.  pt verbalizes plan to avoid slipping again.  pt has enjoyed weight training. pt feels more confident with her abilty to return to work with reduced hours per MD recommendation.    pt with multiple CAD RF demonstrates eagerness to participate in CR program. pt has successfully returned back to work without difficulty. pt has slowly resumed previous work duties. 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    pt with multiple CAD RF demonstrates eagerness to participate in CR program. pt tolerating return to work using caution with difficult activities.    Expected Outcomes  pt will participate in CR exercise, nutrition and lifestyle modification to decrease overall RF.    pt will participate in CR exercise, nutrition and lifestyle modification to decrease overall RF.    pt will participate in CR exercise, nutrition and lifestyle modification to decrease overall RF.    pt will participate in CR exercise, nutrition and lifestyle modification to decrease overall RF.    pt will participate in CR exercise, nutrition and lifestyle modification to decrease overall RF.            Core Components/Risk Factors/Patient Goals at Discharge (Final Review):  Goals and Risk Factor Review - 04/14/18 1150    Core Components/Risk Factors/Patient Goals Review          Personal Goals Review  Weight Management/Obesity;Tobacco Cessation;Hypertension;Lipids;Stress    Review  pt with multiple CAD RF demonstrates eagerness to participate in CR program. pt tolerating return to work using caution with difficult activities.     Expected Outcomes  pt will participate in CR exercise, nutrition and lifestyle modification to decrease overall RF.             ITP Comments: ITP Comments    Row Name 12/23/17 1018 12/29/17 0930 01/11/18 1331 02/14/18 1507 03/16/18 1631   ITP Comments  Dr. Fransico Him, Medical Director  pt started group exercise sessions today. pt tolerated  light activity without difficulty.    30 day ITP review.  pt exhibits eagerness to participate in CR group exercise setting. pt with good attendance and participation.   30 day ITP review.  pt exhibits eagerness to participate in CR group exercise setting. pt with good attendance and participation.   30 day ITP review.  pt exhibits eagerness to participate in CR group exercise setting. pt with good attendance and participation.    [place] Name 04/14/18 1149   ITP Comments  30 day ITP review.  pt exhibits eagerness to participate in CR group exercise setting. pt attendance and participation declined with return to work.       Comments:

## 2018-04-15 ENCOUNTER — Encounter (HOSPITAL_COMMUNITY)
Admission: RE | Admit: 2018-04-15 | Discharge: 2018-04-15 | Disposition: A | Discharge status: Home / Self Care | Payer: Managed Care, Other (non HMO) | Source: Ambulatory Visit | Attending: Cardiology | Admitting: Cardiology

## 2018-04-15 DIAGNOSIS — Z952 Presence of prosthetic heart valve: Secondary | ICD-10-CM

## 2018-04-15 DIAGNOSIS — Z954 Presence of other heart-valve replacement: Secondary | ICD-10-CM | POA: Diagnosis not present

## 2018-04-16 ENCOUNTER — Other Ambulatory Visit: Payer: Self-pay | Admitting: Family Medicine

## 2018-04-18 ENCOUNTER — Encounter (HOSPITAL_COMMUNITY): Payer: Self-pay

## 2018-04-18 ENCOUNTER — Encounter (HOSPITAL_COMMUNITY)
Admission: RE | Admit: 2018-04-18 | Discharge: 2018-04-18 | Disposition: A | Discharge status: Home / Self Care | Payer: Managed Care, Other (non HMO) | Source: Ambulatory Visit | Attending: Cardiology | Admitting: Cardiology

## 2018-04-18 DIAGNOSIS — Z952 Presence of prosthetic heart valve: Secondary | ICD-10-CM

## 2018-04-18 DIAGNOSIS — Z954 Presence of other heart-valve replacement: Secondary | ICD-10-CM | POA: Diagnosis not present

## 2018-04-21 ENCOUNTER — Ambulatory Visit (INDEPENDENT_AMBULATORY_CARE_PROVIDER_SITE_OTHER): Payer: Managed Care, Other (non HMO)

## 2018-04-21 ENCOUNTER — Encounter: Payer: Self-pay | Admitting: Cardiology

## 2018-04-21 ENCOUNTER — Ambulatory Visit (INDEPENDENT_AMBULATORY_CARE_PROVIDER_SITE_OTHER): Payer: Managed Care, Other (non HMO) | Admitting: Cardiology

## 2018-04-21 VITALS — BP 130/70 | HR 78 | Ht 60.0 in | Wt 128.0 lb

## 2018-04-21 DIAGNOSIS — Z7901 Long term (current) use of anticoagulants: Secondary | ICD-10-CM

## 2018-04-21 DIAGNOSIS — Q230 Congenital stenosis of aortic valve: Secondary | ICD-10-CM

## 2018-04-21 DIAGNOSIS — I10 Essential (primary) hypertension: Secondary | ICD-10-CM

## 2018-04-21 DIAGNOSIS — Z954 Presence of other heart-valve replacement: Secondary | ICD-10-CM | POA: Diagnosis not present

## 2018-04-21 DIAGNOSIS — Q231 Congenital insufficiency of aortic valve: Secondary | ICD-10-CM | POA: Diagnosis not present

## 2018-04-21 LAB — POCT INR: INR: 3.1 — AB (ref 2.0–3.0)

## 2018-04-21 NOTE — Progress Notes (Signed)
Cardiology Office Note:    Date:  04/21/2018   ID:  Danielle Obrien, DOB 07-04-1981, MRN [id number]  PCP:  Janith Lima, MD  Cardiologist:  No primary care provider on file.    Referring MD: Janith Lima, MD   Chief Complaint  Patient presents with  . Aortic Stenosis  . Hypertension    History of Present Illness:    Danielle Obrien is a 37 y.o. female with a hx of  bicuspid AV with severe AS, bipolar disorder, asthma and arthritis.  She underwent minimally invasive AVR with a 78mm Sorin Carbomedics Top Hat bileaflet mechanical valve via right anterior mini thoracotomy on 11/03/2017 by Dr. Roxy Manns after coronary CT showed no CAD.  She is here today for followup and is doing well.  She denies any chest pain or pressure, SOB, DOE, PND, orthopnea, LE edema, dizziness, palpitations or syncope. She is compliant with her meds and is tolerating meds with no SE.    Past Medical History:  Diagnosis Date  . Abnormal Pap smear of cervix    --recurrent ascus w/Pos. HR HPV  . ADD (attention deficit disorder)   . Alcohol abuse, in remission 2012  . Allergy   . Anxiety   . Aortic stenosis due to bicuspid aortic valve 02/20/2014  . Arthritis    In hips  . Asthma    rare inhaler use  . Bipolar disorder ([place])   . Headache   . Hyperlipidemia with target LDL less than 130 01/27/2013  . Hypertension   . Muscle spasms of both lower extremities    both hips  . 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 Tobacco abuse 09/27/2015  . 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 ABDOMINAL HYSTERECTOMY  02/06/2009   Robotic total laparoscopic hysterectomy  . ANTERIOR CRUCIATE LIGAMENT REPAIR  1993  . AORTIC VALVE REPLACEMENT N/A 11/03/2017   Procedure: MINIMALLY INVASIVE AORTIC VALVE REPLACEMENT( MINI THORACOTOMY);  Surgeon: Rexene Alberts, MD;  Location: [place];  Service: Open Heart Surgery;  Laterality: N/A;  . CARDIAC CATHETERIZATION  June 2015   no CAD - moderate AS noted  . 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 COLPOSCOPY  10/2008   CIN I & II  . COLPOSCOPY  07/2000   Neg. ECC  . COLPOSCOPY  06/2001   CIN I  . COLPOSCOPY  08/2004   ECC--atypia  . COLPOSCOPY N/A 01/21/2016   Procedure: COLPOSCOPY with vaginal biopsy with CO 2 Laser of Vaginal and vulvar condyloma;  Surgeon: [name], MD;  Location: [facility];  Service: Gynecology;  Laterality: N/A;  [name] will be here 2/21 for 1115 case confirmed 01/16/15 - TS  . HERNIA REPAIR  1981/1982  . HIP SURGERY  1981   Hip Reset   . HIP SURGERY  1990   Plate was reconstructed/ took out growth plate in Right knee  . HIP SURGERY  1992   Plate removed in Left hip  . LEFT AND RIGHT HEART CATHETERIZATION WITH CORONARY ANGIOGRAM N/A 05/18/2014   Procedure: LEFT AND RIGHT HEART CATHETERIZATION WITH CORONARY ANGIOGRAM;  Surgeon: Jettie Booze, MD;  Location: [facility] CATH LAB;  Service:  Cardiovascular;  Laterality: N/A;  . LYMPH NODE BIOPSY  1995  . NASAL SEPTUM SURGERY  2002  . TEE WITHOUT CARDIOVERSION N/A 11/03/2017   Procedure: TRANSESOPHAGEAL ECHOCARDIOGRAM (TEE);  Surgeon: Rexene Alberts, MD;  Location: [place];  Service: Open Heart Surgery;  Laterality: N/A;  . TONSILLECTOMY AND ADENOIDECTOMY  1990  . TYMPANOSTOMY TUBE PLACEMENT  1981/1982    Current Medications: No outpatient medications have been marked as taking for the 04/21/18 encounter (Office Visit) with Sueanne Margarita, MD.     Allergies:   Demerol; Ibuprofen; and Codeine   Social History   Socioeconomic History  . Marital status: Single    Spouse name: Not on file  . Number of children: Not on file  . Years of education: Not on file  . Highest  education level: Not on file  Occupational History  . Not on file  Social Needs  . Financial resource strain: Not on file  . Food insecurity:    Worry: Not on file    Inability: Not on file  . Transportation needs:    Medical: Not on file    Non-medical: Not on file  Tobacco Use  . Smoking status: Former Smoker    Packs/day: 0.50    Years: 12.00    Pack years: 6.00    Types: Cigarettes    Last attempt to quit: 11/02/2017    Years since quitting: 0.4  . Smokeless tobacco: Never Used  . Tobacco comment: pt given fake cigarette for hand/mouth association. pt using [facility] for support  Substance and Sexual Activity  . Alcohol use: No    Alcohol/week: 0.0 oz  . Drug use: No  . Sexual activity: Yes    Partners: Male    Birth control/protection: Surgical    Comment: Hysterectomy  Lifestyle  . Physical activity:    Days per week: Not on file    Minutes per session: Not on file  . Stress: Not on file  Relationships  . Social connections:    Talks on phone: Not on file    Gets together: Not on file    Attends religious service: Not on file    Active member of club or organization: Not on file    Attends meetings of clubs or organizations: Not on file    Relationship status: Not on file  Other Topics Concern  . Not on file  Social History Narrative  . Not on file     Family History: The patient's family history includes Hyperlipidemia in her brother, father, and mother; Hypertension in her brother, father, and mother; Migraines in her father; Thyroid disease in her maternal grandfather, maternal grandmother, and mother. There is no history of Cancer.  ROS:   Please see the history of present illness.    ROS  All other systems reviewed and negative.   EKGs/Labs/Other Studies Reviewed:    The following studies were reviewed today: none  EKG:  EKG is  ordered today showed normal sinus rhythm with left atrial enlargement and no ST changes.  Recent  Labs: 04/27/2017: B Natriuretic Peptide 29.8 11/01/2017: ALT 25 11/04/2017: Magnesium 2.4 11/24/2017: BUN 15; Creatinine, Ser 0.87; Hemoglobin 12.2; Platelets 356; Potassium 4.9; Sodium 144   Recent Lipid Panel    Component Value Date/Time   CHOL 254 (H) 08/20/2016 0901   TRIG 67.0 08/20/2016 0901   HDL 79.00 08/20/2016 0901   CHOLHDL 3 08/20/2016 0901   VLDL 13.4 08/20/2016 0901   LDLCALC 162 (H)  08/20/2016 0901   LDLDIRECT 171.0 01/27/2013 1425    Physical Exam:    VS:  BP 130/70   Pulse 78   Ht 5' (1.524 m)   Wt 128 lb (58.1 kg)   BMI 25.00 kg/m     Wt Readings from Last 3 Encounters:  04/21/18 128 lb (58.1 kg)  04/05/18 124 lb 12.5 oz (56.6 kg)  02/08/18 125 lb (56.7 kg)     GEN:  Well nourished, well developed in no acute distress HEENT: Normal NECK: No JVD; No carotid bruits LYMPHATICS: No lymphadenopathy CARDIAC: RRR, no murmurs, rubs, gallops RESPIRATORY:  Clear to auscultation without rales, wheezing or rhonchi  ABDOMEN: Soft, non-tender, non-distended MUSCULOSKELETAL:  No edema; No deformity  SKIN: Warm and dry NEUROLOGIC:  Alert and oriented x 3 PSYCHIATRIC:  Normal affect   ASSESSMENT:    1. Aortic stenosis due to bicuspid aortic valve   2. Essential hypertension, benign    PLAN:    In order of problems listed above:  1.  Severe AS - s/p minimally invasive AVR with a 46mm Sorin Carbomedics Top Hat bileaflet mechanical valve via right anterior mini thoracotomy on 11/03/2017.  She will continue on warfarin and ASA.  She is followed in our coumadin clinic.  2.  HTN - BP is well controlled on exam today.  She will continue on Lopressor 12.5mg  BID.     Medication Adjustments/Labs and Tests Ordered: Current medicines are reviewed at length with the patient today.  Concerns regarding medicines are outlined above.  No orders of the defined types were placed in this encounter.  No orders of the defined types were placed in this  encounter.   Signed, Fransico Him, MD  04/21/2018 9:31 AM    [place]

## 2018-04-21 NOTE — Patient Instructions (Signed)
Description   Skip tomorrow's dosage of Coumadin, then resume same dosage 1 tablet daily except 1.5 tablets on Sundays, Tuesdays and Thursdays.  Recheck in 3 weeks.  Coumadin Clinic [phone] Main [phone]

## 2018-04-21 NOTE — Patient Instructions (Addendum)
Medication Instructions:  Your physician recommends that you continue on your current medications as directed. Please refer to the Current Medication list given to you today.  If you need a refill on your cardiac medications, please contact your pharmacy first.  Labwork: None ordered   Testing/Procedures: None ordered   Follow-Up: Your physician wants you to follow-up with Dr. Roxy Manns to assess your surgical scar.  Your physician wants you to follow-up in: 1 year with Dr. Radford Pax. You will receive a reminder letter in the mail two months in advance. If you don't receive a letter, please call our office to schedule the follow-up appointment.  Any Other Special Instructions Will Be Listed Below (If Applicable).   Thank you for choosing [facility], RN  [phone]  If you need a refill on your cardiac medications before your next appointment, please call your pharmacy.

## 2018-04-26 NOTE — Progress Notes (Signed)
[name] [address], [place] [address] Phone: [phone] Subjective:       CC: Back pain follow-up  [id number]  Danielle Obrien is a 37 y.o. female coming in with complaint of back pain. She has been having pain in her back and hips. Pain radiates down to her feet.  Patient has had chronic pain for quite some time.  Recently did have a heart surgery.  Now is on chronic anticoagulation.  Unable to do any oral anti-inflammatories at this time.  Patient has responded fairly well to the tramadol that allows her to continue to function on a daily basis.  Patient has been taking 100 mg 2 times daily.  Gabapentin has also been helpful at 600 mg 3 times a day.  Patient has had chronic back pain and hip pain.  Sometimes can radiate to the feet.  Rates the severity of pain without the medication as 9 out of 10 and she is unable to function appropriately but with the medication is 4 out of 10 and is able to do daily activities and even stand on her feet for 12 hours at a time.  Patient has not had a urine drug test in the last 12 months which she is rescheduled.  Running patient's name in the drug database shows that patient has had oxycodone from another provider after her surgery but otherwise has been getting the tramadol from [place].  This was reviewed today.  X-rays are greater than 36 years old     Past Medical History:  Diagnosis Date  . Abnormal Pap smear of cervix    --recurrent ascus w/Pos. HR HPV  . ADD (attention deficit disorder)   . Alcohol abuse, in remission 2012  . Allergy   . Anxiety   . Aortic stenosis due to bicuspid aortic valve 02/20/2014  . Arthritis    In hips  . Asthma    rare inhaler use  . Bipolar disorder ([place])   . Headache   . Hyperlipidemia with target LDL less than 130 01/27/2013  . Hypertension   . Muscle spasms of both lower extremities    both hips  . S/P minimally invasive aortic valve replacement with a bileaflet  mechanical valve 11/03/2017   23 mm Sorin Carbomedics Top Hat bileaflet mechanical valve via right anterior mini thoracotomy  . Tobacco abuse 09/27/2015  . 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 ABDOMINAL HYSTERECTOMY  02/06/2009   Robotic total laparoscopic hysterectomy  . ANTERIOR CRUCIATE LIGAMENT REPAIR  1993  . AORTIC VALVE REPLACEMENT N/A 11/03/2017   Procedure: MINIMALLY INVASIVE AORTIC VALVE REPLACEMENT( MINI THORACOTOMY);  Surgeon: Rexene Alberts, MD;  Location: [place];  Service: Open Heart Surgery;  Laterality: N/A;  . CARDIAC CATHETERIZATION  June 2015   no CAD - moderate AS noted  . 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 COLPOSCOPY  10/2008   CIN I & II  . COLPOSCOPY  07/2000   Neg. ECC  . COLPOSCOPY  06/2001   CIN I  . COLPOSCOPY  08/2004   ECC--atypia  . COLPOSCOPY N/A 01/21/2016   Procedure: COLPOSCOPY with vaginal biopsy with CO 2 Laser of Vaginal and vulvar condyloma;  Surgeon: [name], MD;  Location: [facility];  Service: Gynecology;  Laterality: N/A;  [name] will be here 2/21 for 1115 case confirmed 01/16/15 - TS  . HERNIA REPAIR  1981/1982  . HIP SURGERY  1981   Hip Reset   . HIP SURGERY  1990   Plate was reconstructed/ took out growth plate in Right knee  . HIP SURGERY  1992   Plate removed in Left hip  . LEFT AND RIGHT HEART CATHETERIZATION WITH CORONARY ANGIOGRAM N/A 05/18/2014   Procedure: LEFT AND RIGHT HEART CATHETERIZATION WITH CORONARY ANGIOGRAM;  Surgeon: Jettie Booze, MD;  Location: [facility] CATH LAB;  Service: Cardiovascular;  Laterality: N/A;  . LYMPH NODE BIOPSY  1995  . NASAL SEPTUM SURGERY  2002  . TEE WITHOUT CARDIOVERSION N/A 11/03/2017   Procedure: TRANSESOPHAGEAL ECHOCARDIOGRAM (TEE);  Surgeon: Rexene Alberts, MD;  Location: [place];  Service: Open Heart Surgery;  Laterality: N/A;   . TONSILLECTOMY AND ADENOIDECTOMY  1990  . TYMPANOSTOMY TUBE PLACEMENT  1981/1982   Social History   Socioeconomic History  . Marital status: Single    Spouse name: Not on file  . Number of children: Not on file  . Years of education: Not on file  . Highest education level: Not on file  Occupational History  . Not on file  Social Needs  . Financial resource strain: Not on file  . Food insecurity:    Worry: Not on file    Inability: Not on file  . Transportation needs:    Medical: Not on file    Non-medical: Not on file  Tobacco Use  . Smoking status: Former Smoker    Packs/day: 0.50    Years: 12.00    Pack years: 6.00    Types: Cigarettes    Last attempt to quit: 11/02/2017    Years since quitting: 0.4  . Smokeless tobacco: Never Used  . Tobacco comment: pt given fake cigarette for hand/mouth association. pt using [facility] for support  Substance and Sexual Activity  . Alcohol use: No    Alcohol/week: 0.0 oz  . Drug use: No  . Sexual activity: Yes    Partners: Male    Birth control/protection: Surgical    Comment: Hysterectomy  Lifestyle  . Physical activity:    Days per week: Not on file    Minutes per session: Not on file  . Stress: Not on file  Relationships  . Social connections:    Talks on phone: Not on file    Gets together: Not on file    Attends religious service: Not on file    Active member of club or organization: Not on file    Attends meetings of clubs or organizations: Not on file    Relationship status: Not on file  Other Topics Concern  . Not on file  Social History Narrative  . Not on file   Allergies  Allergen Reactions  . Demerol Nausea And Vomiting  . Ibuprofen Other (See Comments)    Gastritis   . Codeine Itching   Family History  Problem Relation Age of Onset  . Hyperlipidemia Mother   . Hypertension Mother   . Thyroid disease Mother   . Hyperlipidemia Father   . Hypertension Father   . Migraines Father   .  Hypertension Brother   . Hyperlipidemia Brother   . Thyroid disease Maternal Grandmother   . Thyroid disease Maternal Grandfather   . Cancer Neg Hx      Past medical history, social, surgical and family history all reviewed in electronic medical record.  No pertanent information unless stated regarding to the chief complaint.   Review of Systems:Review of systems updated and as accurate as of 04/28/18  No headache, visual changes, nausea, vomiting, diarrhea, constipation, dizziness, abdominal pain, skin rash, fevers, chills, night sweats, weight loss, swollen lymph nodes, body aches,  chest pain, shortness of breath, mood changes.  Positive muscle aches, joint swelling  Objective  Blood pressure 138/82, pulse 83, height 5' (1.524 m), weight 130 lb (59 kg), SpO2 98 %. Systems examined below as of 04/28/18   General: No apparent distress alert and oriented x3 mood and affect normal, dressed appropriately.  HEENT: Pupils equal, extraocular movements intact  Respiratory: Patient's speak in full sentences and does not appear short of breath  Cardiovascular: No lower extremity edema, non tender, no erythema  Skin: Warm dry intact with no signs of infection or rash on extremities or on axial skeleton.  Abdomen: Soft nontender  Neuro: Cranial nerves II through XII are intact, neurovascularly intact in all extremities with 2+ DTRs and 2+ pulses.  Lymph: No lymphadenopathy of posterior or anterior cervical chain or axillae bilaterally.  Gait antalgic MSK:  tender with full range of motion and good stability and symmetric strength and tone of shoulders, elbows, wrist, hip, knee and ankles bilaterally.   Patient is low back exam does show some mild scoliosis and lumbar spine.  Severe tenderness to palpation in the paraspinal musculature.  Positive Faber test bilaterally but negative straight leg test.  Mild limitation in all planes of range of motion.  Neurovascular intact distally deep tendon  reflexes intact.   Impression and Recommendations:     This case required medical decision making of moderate complexity.      Note: This dictation was prepared with Dragon dictation along with smaller phrase technology. Any transcriptional errors that result from this process are unintentional.

## 2018-04-28 ENCOUNTER — Other Ambulatory Visit: Payer: Managed Care, Other (non HMO)

## 2018-04-28 ENCOUNTER — Other Ambulatory Visit: Payer: Self-pay

## 2018-04-28 ENCOUNTER — Ambulatory Visit (INDEPENDENT_AMBULATORY_CARE_PROVIDER_SITE_OTHER): Payer: Managed Care, Other (non HMO) | Admitting: Family Medicine

## 2018-04-28 ENCOUNTER — Encounter: Payer: Self-pay | Admitting: Family Medicine

## 2018-04-28 ENCOUNTER — Ambulatory Visit (INDEPENDENT_AMBULATORY_CARE_PROVIDER_SITE_OTHER)
Admission: RE | Admit: 2018-04-28 | Discharge: 2018-04-28 | Disposition: A | Discharge status: Home / Self Care | Payer: Managed Care, Other (non HMO) | Source: Ambulatory Visit | Attending: Family Medicine | Admitting: Family Medicine

## 2018-04-28 VITALS — BP 138/82 | HR 83 | Ht 60.0 in | Wt 130.0 lb

## 2018-04-28 DIAGNOSIS — M545 Low back pain: Secondary | ICD-10-CM

## 2018-04-28 DIAGNOSIS — F1190 Opioid use, unspecified, uncomplicated: Secondary | ICD-10-CM

## 2018-04-28 MED ORDER — DULOXETINE HCL 20 MG PO CPEP: 20.0000 mg | ORAL_CAPSULE | Freq: Every day | ORAL | 1 refills | Status: DC

## 2018-04-28 MED ORDER — TRAMADOL HCL 50 MG PO TABS: 100.0000 mg | ORAL_TABLET | Freq: Two times a day (BID) | ORAL | 3 refills | Status: DC

## 2018-04-28 NOTE — Patient Instructions (Addendum)
Good to see you  Xrays and the urine test downstairs today  Refilled the tramadol 2 pills up to 2 times a day, you know we want to see if we can get you off in the long time.  Cymbalta 20 mg daily  See me again in 3 months

## 2018-04-28 NOTE — Assessment & Plan Note (Addendum)
Patient has been on chronic continuous use of opiates.  100 mg of tramadol bilaterally.  Discussed with her that if this does not seem to ever all relief her pain we would need to send her to pain management for other treatment options and care.  Patient has been doing relatively well.  UDS ordered today.  Patient has been checked in the drug database for any inconsistency.  Follow-up again in 3 months Spent  25 minutes with patient face-to-face and had greater than 50% of counseling including as described above in assessment and plan.

## 2018-05-02 NOTE — Progress Notes (Signed)
Discharge Progress Report  Patient Details  Name: Danielle Obrien MRN: [id number] Date of Birth: 16-Jun-1981 Referring Provider:   Flowsheet Row CARDIAC REHAB PHASE II ORIENTATION from 12/23/2017 in [place]  Referring Provider  Fransico Him MD       Number of Visits: 36   Reason for Discharge:  Patient has met program and personal goals.  Smoking History:  Social History   Tobacco Use  Smoking Status Former Smoker  . Packs/day: 0.50  . Years: 12.00  . Pack years: 6.00  . Types: Cigarettes  . Last attempt to quit: 11/02/2017  . Years since quitting: 0.4  Smokeless Tobacco Never Used  Tobacco Comment   pt given fake cigarette for hand/mouth association. pt using [facility] for support    Diagnosis:  S/P AVR (aortic valve replacement)  ADL UCSD:   Initial Exercise Prescription: Initial Exercise Prescription - 12/23/17 1100    Date of Initial Exercise RX and Referring Provider          Date  12/23/17    Referring Provider  Fransico Him MD        Treadmill          MPH  2.4    Grade  0    Minutes  10    METs  2.84        Bike          Level  0.5    Minutes  10    METs  2.72        NuStep          Level  3    SPM  80    Minutes  10    METs  2.4        Prescription Details          Frequency (times per week)  3    Duration  Progress to 30 minutes of continuous aerobic without signs/symptoms of physical distress        Intensity          THRR 40-80% of Max Heartrate  73-146    Ratings of Perceived Exertion  11-15    Perceived Dyspnea  0-4        Progression          Progression  Continue to progress workloads to maintain intensity without signs/symptoms of physical distress.        Resistance Training          Training Prescription  Yes    Weight  2lbs    Reps  10-15           Discharge Exercise Prescription (Final Exercise Prescription Changes): Exercise Prescription Changes -  04/18/18 1000    Response to Exercise          Blood Pressure (Admit)  98/64    Blood Pressure (Exercise)  108/72    Blood Pressure (Exit)  110/68    Heart Rate (Admit)  88 bpm    Heart Rate (Exercise)  103 bpm    Heart Rate (Exit)  75 bpm    Rating of Perceived Exertion (Exercise)  12    Symptoms  none    Comments  late start    Duration  Continue with 30 min of aerobic exercise without signs/symptoms of physical distress.    Intensity  THRR unchanged        Progression          Progression  Continue  to progress workloads to maintain intensity without signs/symptoms of physical distress.    Average METs  5.5        Resistance Training          Training Prescription  Yes pt uses cable column for weights as an exercise station    Weight  40-50    Reps  10-15 2-3sets    Time  20 Minutes        Treadmill          MPH  3.4    Grade  4    Minutes  15    METs  5.48        Home Exercise Plan          Plans to continue exercise at  Home (comment) walking    Frequency  Add 2 additional days to program exercise sessions.    Initial Home Exercises Provided  01/14/18           Functional Capacity: 6 Minute Walk    6 Minute Walk    Row Name 12/23/17 1117 04/05/18 1624   Phase  Initial  Discharge   Distance  1800 feet  1957 feet   Distance % Change  no documentation  8.72 %   Distance Feet Change  no documentation  157 ft   Walk Time  6 minutes  6 minutes   # of Rest Breaks  0  0   MPH  3.4  3.7   METS  5.6  6   RPE  12  10   Perceived Dyspnea   1  0   VO2 Peak  19.53  21.1   Symptoms  Yes (comment)  No   Comments  mild SOB  no documentation   Resting HR  81 bpm  95 bpm   Resting BP  104/60  102/70   Resting Oxygen Saturation   97 %  no documentation   Exercise Oxygen Saturation  during 6 min walk  98 %  no documentation   Max Ex. HR  99 bpm  117 bpm   Max Ex. BP  124/82  132/76   2 Minute Post BP  118/70  96/64          Psychological, QOL, Others -  Outcomes: PHQ 2/9: Depression screen [facility] 2/9 04/18/2018 12/29/2017 07/01/2017  Decreased Interest 0 0 2  Down, Depressed, Hopeless '1 1 2  '$ PHQ - 2 Score '1 1 4  '$ Altered sleeping - - 1  Tired, decreased energy - - 1  Change in appetite - - 2  Feeling bad or failure about yourself  - - 1  Trouble concentrating - - 1  Moving slowly or fidgety/restless - - 1  Suicidal thoughts - - 0  PHQ-9 Score - - 11  Difficult doing work/chores - - Somewhat difficult  Some recent data might be hidden    Quality of Life: Quality of Life - 12/23/17 1132    Quality of Life Scores          Health/Function Pre  23.14 %    Socioeconomic Pre  23.07 %    Psych/Spiritual Pre  19.71 %    Family Pre  24 %    GLOBAL Pre  22.48 %           Personal Goals: Goals established at orientation with interventions provided to work toward goal. Personal Goals and Risk Factors at Admission - 12/29/17 0936    Core Components/Risk Factors/Patient Goals on  Admission          Tobacco Cessation  Yes    Intervention  Assist the participant in steps to quit. Provide individualized education and counseling about committing to Tobacco Cessation, relapse prevention, and pharmacological support that can be provided by physician.;Advice worker, assist with locating and accessing local/national Quit Smoking programs, and support quit date choice.    Expected Outcomes  Long Term: Complete abstinence from all tobacco products for at least 12 months from quit date.            Personal Goals Discharge: Goals and Risk Factor Review    Core Components/Risk Factors/Patient Goals Review    Row Name 12/29/17 3664 01/12/18 1051 02/14/18 1507 03/16/18 1632 04/14/18 1150   Personal Goals Review  Weight Management/Obesity;Tobacco Cessation;Hypertension;Lipids;Stress  Weight Management/Obesity;Tobacco Cessation;Hypertension;Lipids;Stress  Weight Management/Obesity;Tobacco Cessation;Hypertension;Lipids;Stress  Weight  Management/Obesity;Tobacco Cessation;Hypertension;Lipids;Stress  Weight Management/Obesity;Tobacco Cessation;Hypertension;Lipids;Stress   Review  pt with multiple CAD RF demonstrates eagerness to participate in CR program. pt working with [facility] on tobacco cessation, which she has maintained since surgery. pt given fake cigarette today for hand/mouth association. pt concerned about current low strength/stamina and eager to increase both prior to returning to work at [employer] in 4 weeks.   pt with multiple CAD RF demonstrates eagerness to participate in CR program. pt admits she has slipped and smoked a few cigarettes as stress reliever. pt verbalizes plan to avoid slipping again.  pt given fake cigarette today for hand/mouth association. pt voices continued concerns  about current low strength/stamina and eager to increase both prior to returning to work at [employer] in a few  weeks., however confidence is increasing.   pt with multiple CAD RF demonstrates eagerness to participate in CR program. pt admits she has slipped and smoked a few cigarettes as stress reliever. pt is working hard to completely stop.  pt verbalizes plan to avoid slipping again.  pt has enjoyed weight training. pt feels more confident with her abilty to return to work with reduced hours per MD recommendation.    pt with multiple CAD RF demonstrates eagerness to participate in CR program. pt has successfully returned back to work without difficulty. pt has slowly resumed previous work duties. 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    pt with multiple CAD RF demonstrates eagerness to participate in CR program. pt tolerating return to work using caution with difficult activities.    Expected Outcomes  pt will participate in CR exercise, nutrition and lifestyle modification to decrease overall RF.    pt will participate in CR exercise, nutrition and lifestyle modification to decrease  overall RF.    pt will participate in CR exercise, nutrition and lifestyle modification to decrease overall RF.    pt will participate in CR exercise, nutrition and lifestyle modification to decrease overall RF.    pt will participate in CR exercise, nutrition and lifestyle modification to decrease overall RF.         Core Components/Risk Factors/Patient Goals Review    Row Name 04/18/18 1610   Personal Goals Review  Weight Management/Obesity;Tobacco Cessation;Hypertension;Lipids;Stress   Review  pt with multiple CAD RF demonstrates willingness to continue RF modification. pt congratulated on her continued smoking cessation.  pt is pleased with her increased strength/stamina, and her increased mindfulness or appropriate activity.     Expected Outcomes  pt will participate in CR exercise, nutrition and lifestyle modification to decrease overall RF.  Exercise Goals and Review: Exercise Goals    Exercise Goals    Row Name 12/23/17 1118   Increase Physical Activity  Yes   Intervention  Provide advice, education, support and counseling about physical activity/exercise needs.;Develop an individualized exercise prescription for aerobic and resistive training based on initial evaluation findings, risk stratification, comorbidities and participant's personal goals.   Expected Outcomes  Short Term: Attend rehab on a regular basis to increase amount of physical activity.;Long Term: Add in home exercise to make exercise part of routine and to increase amount of physical activity.;Long Term: Exercising regularly at least 3-5 days a week.   Increase Strength and Stamina  Yes   Intervention  Provide advice, education, support and counseling about physical activity/exercise needs.;Develop an individualized exercise prescription for aerobic and resistive training based on initial evaluation findings, risk stratification, comorbidities and participant's personal goals.   Expected Outcomes  Short Term:  Perform resistance training exercises routinely during rehab and add in resistance training at home;Short Term: Increase workloads from initial exercise prescription for resistance, speed, and METs.;Long Term: Improve cardiorespiratory fitness, muscular endurance and strength as measured by increased METs and functional capacity (6MWT)   Able to understand and use rate of perceived exertion (RPE) scale  Yes   Intervention  Provide education and explanation on how to use RPE scale   Expected Outcomes  Short Term: Able to use RPE daily in rehab to express subjective intensity level;Long Term:  Able to use RPE to guide intensity level when exercising independently   Knowledge and understanding of Target Heart Rate Range (THRR)  Yes   Intervention  Provide education and explanation of THRR including how the numbers were predicted and where they are located for reference   Expected Outcomes  Short Term: Able to state/look up THRR;Short Term: Able to use daily as guideline for intensity in rehab;Long Term: Able to use THRR to govern intensity when exercising independently   Able to check pulse independently  Yes   Intervention  Provide education and demonstration on how to check pulse in carotid and radial arteries.;Review the importance of being able to check your own pulse for safety during independent exercise   Expected Outcomes  Short Term: Able to explain why pulse checking is important during independent exercise;Long Term: Able to check pulse independently and accurately   Understanding of Exercise Prescription  Yes   Intervention  Provide education, explanation, and written materials on patient's individual exercise prescription   Expected Outcomes  Short Term: Able to explain program exercise prescription;Long Term: Able to explain home exercise prescription to exercise independently          Nutrition & Weight - Outcomes: Pre Biometrics - 12/23/17 1105    Pre Biometrics          Height  5'  (1.524 m)    Weight  124 lb 9 oz (56.5 kg)    Waist Circumference  30.5 inches    Hip Circumference  34.5 inches    Waist to Hip Ratio  0.88 %    BMI (Calculated)  24.33    Triceps Skinfold  19 mm    % Body Fat  31.9 %    Grip Strength  28 kg    Flexibility  19 in    Single Leg Stand  19.21 seconds          Post Biometrics - 04/05/18 1626     Post  Biometrics          Height  5' (1.524 m)    Weight  124 lb 12.5 oz (56.6 kg)    Waist Circumference  29 inches    Hip Circumference  34 inches    Waist to Hip Ratio  0.85 %    BMI (Calculated)  24.37    Triceps Skinfold  18 mm    % Body Fat  31.1 %    Grip Strength  32 kg    Flexibility  20 in    Single Leg Stand  25 seconds           Nutrition: Nutrition Therapy & Goals - 01/10/18 0945    Nutrition Therapy          Diet  Heart Healthy    Drug/Food Interactions  Coumadin/Vit K        Personal Nutrition Goals          Nutrition Goal  Wt loss of 1-2 lb/week to a wt loss goal of 5-7 lb at graduation from [place].     Personal Goal #2  Pt to describe the benefit of including fruits, vegetables, whole grains, and low-fat dairy products in a heart healthy meal plan.        Intervention Plan          Intervention  Prescribe, educate and counsel regarding individualized specific dietary modifications aiming towards targeted core components such as weight, hypertension, lipid management, diabetes, heart failure and other comorbidities.    Expected Outcomes  Short Term Goal: Understand basic principles of dietary content, such as calories, fat, sodium, cholesterol and nutrients.;Long Term Goal: Adherence to prescribed nutrition plan.           Nutrition Discharge: Nutrition Assessments - 01/10/18 0944    MEDFICTS Scores          Pre Score  36           Education Questionnaire Score: Knowledge Questionnaire Score - 12/23/17 1127    Knowledge Questionnaire Score          Pre Score  20/24            Goals reviewed with patient; copy given to patient.

## 2018-05-03 LAB — PAIN MGMT, PROFILE 8 W/CONF, U
6 ACETYLMORPHINE: NEGATIVE ng/mL (ref <10)
Alcohol Metabolites: NEGATIVE ng/mL (ref <500)
Amphetamines: NEGATIVE ng/mL (ref <500)
BENZODIAZEPINES: NEGATIVE ng/mL (ref <100)
BUPRENORPHINE, URINE: NEGATIVE ng/mL (ref <5)
CREATININE: 6.6 mg/dL — AB
Cocaine Metabolite: NEGATIVE ng/mL (ref <150)
MARIJUANA METABOLITE: NEGATIVE ng/mL (ref <20)
MDMA: NEGATIVE ng/mL (ref <500)
OPIATES: NEGATIVE ng/mL (ref <100)
OXIDANT: NEGATIVE ug/mL (ref <200)
Oxycodone: NEGATIVE ng/mL (ref <100)
SPECIFIC GRAVITY: 1 — AB (ref >=1.0)
pH: 7.14 (ref 4.5–9.0)

## 2018-05-06 ENCOUNTER — Other Ambulatory Visit: Payer: Self-pay | Admitting: Nurse Practitioner

## 2018-05-06 DIAGNOSIS — F411 Generalized anxiety disorder: Secondary | ICD-10-CM

## 2018-05-10 ENCOUNTER — Other Ambulatory Visit: Payer: Self-pay | Admitting: Physician Assistant

## 2018-05-10 ENCOUNTER — Other Ambulatory Visit: Payer: Self-pay | Admitting: Cardiology

## 2018-05-10 MED ORDER — METOPROLOL TARTRATE 25 MG PO TABS: 12.5000 mg | ORAL_TABLET | Freq: Two times a day (BID) | ORAL | 3 refills | Status: DC

## 2018-05-12 ENCOUNTER — Ambulatory Visit (INDEPENDENT_AMBULATORY_CARE_PROVIDER_SITE_OTHER): Payer: Managed Care, Other (non HMO) | Admitting: *Deleted

## 2018-05-12 DIAGNOSIS — Z954 Presence of other heart-valve replacement: Secondary | ICD-10-CM

## 2018-05-12 DIAGNOSIS — Q231 Congenital insufficiency of aortic valve: Secondary | ICD-10-CM

## 2018-05-12 DIAGNOSIS — Q230 Congenital stenosis of aortic valve: Secondary | ICD-10-CM

## 2018-05-12 DIAGNOSIS — Z7901 Long term (current) use of anticoagulants: Secondary | ICD-10-CM

## 2018-05-12 LAB — POCT INR: INR: 2.6 (ref 2.0–3.0)

## 2018-05-12 NOTE — Patient Instructions (Signed)
Description   Continue taking your same dosage 1 tablet daily except 1.5 tablets on Sundays, Tuesdays and Thursdays.  Recheck in 4 weeks.  [facility] [phone] Main [phone]

## 2018-05-16 ENCOUNTER — Encounter: Payer: Self-pay | Admitting: Thoracic Surgery (Cardiothoracic Vascular Surgery)

## 2018-05-16 ENCOUNTER — Ambulatory Visit (INDEPENDENT_AMBULATORY_CARE_PROVIDER_SITE_OTHER): Payer: Managed Care, Other (non HMO) | Admitting: Thoracic Surgery (Cardiothoracic Vascular Surgery)

## 2018-05-16 VITALS — BP 118/70 | HR 82 | Resp 20 | Ht 60.0 in | Wt 125.0 lb

## 2018-05-16 DIAGNOSIS — L910 Hypertrophic scar: Secondary | ICD-10-CM

## 2018-05-16 DIAGNOSIS — Z954 Presence of other heart-valve replacement: Secondary | ICD-10-CM

## 2018-05-16 NOTE — Progress Notes (Signed)
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  She was recently seen in follow-up by Dr. Radford Obrien at which time she was doing well.  She noted some mild discomfort over her anterior chest wall and was referred to our office for follow-up due to concern of possible early keloid formation.  The patient reports that she is doing quite well.  She is back at work and she notes that her exercise tolerance continues to improve.  Overall she feels much better than she did prior to surgery.  At work she has to lift boxes intermittently during the day filled with various types of food, and this sometimes requires reaching over her head.  She has noted some mild discomfort at the site of her surgical scar.  Overall she is doing well and she feels much better than she did before.  She denies any sensation of clicking or motion of the costal cartilage or rib.  She denies any significant bulging of the soft tissues in between the ribs.  She is not smoking cigarettes.   Current Outpatient Medications  Medication Sig Dispense Refill  . albuterol (PROVENTIL HFA;VENTOLIN HFA) 108 (90 Base) MCG/ACT inhaler Inhale 2 puffs into the lungs every 6 (six) hours as needed for wheezing or shortness of breath. 1 Inhaler 3  . aspirin EC 81 MG tablet Take 1 tablet (81 mg total) by mouth daily. 90 tablet 3  . busPIRone (BUSPAR) 30 MG tablet TAKE ONE TABLET BY MOUTH TWICE A DAY *AT 10AM AND AT 5PM* 60 tablet 1  . Calcium Carbonate-Vitamin D (CALCIUM 600/VITAMIN D PO) Take 1 tablet by mouth  daily.    . DULoxetine (CYMBALTA) 20 MG capsule Take 1 capsule (20 mg total) by mouth daily. 30 capsule 1  . gabapentin (NEURONTIN) 300 MG capsule TAKE 2 CAPSULES BY MOUTH THREE TIMES A DAY 360 capsule 0  . lamoTRIgine (LAMICTAL) 200 MG tablet TAKE 2 TABLETS (400 MG TOTAL) BY MOUTH DAILY. (Patient taking differently: Take 400 mg by mouth daily. ) 180 tablet 2  . lansoprazole (PREVACID) 15 MG capsule Take 15 mg by mouth daily as needed (for heartburn or acid reflux).     . metoprolol tartrate (LOPRESSOR) 25 MG tablet Take 0.5 tablets (12.5 mg total) by mouth 2 (two) times daily. 90 tablet 3  . Tetrahydrozoline HCl (VISINE OP) Place 1 drop into both eyes at bedtime as needed (for allergies).    . traMADol (ULTRAM) 50 MG tablet Take 2 tablets (100 mg total) by mouth 2 (two) times daily. 120 tablet 3  . warfarin (COUMADIN) 7.5 MG tablet Take as directed by [facility] 40 tablet 1   No current facility-administered medications for this visit.       Physical Exam:   BP 118/70   Pulse 82   Resp 20   Ht 5' (1.524 m)   Wt 125 lb (56.7 kg)   SpO2 97%   BMI 24.41 kg/m   General:  Well-appearing  Chest:   Clear to auscultation  CV:   Regular rate and rhythm with mechanical heart valve sounds  Incisions:  Well-healed scar without any evidence for keloid formation.  The small titanium plate overlying the costal cartilage is palpable given the patient's relatively thin anterior chest wall.  There is no evidence for any motion of the plate or costal cartilage and there is no soft tissue hernia  Abdomen:  Soft nontender  Extremities:  Warm and well-perfused  Diagnostic Tests:  n/a   Impression:  Patient is doing well approximately 6 months status post minimally invasive aortic valve replacement  Plan:  We have not recommended any change the patient's current medications.  I have encouraged patient to continue to gradually increase her physical activity without any particular limitations,  but I have cautioned her to be careful to avoid activities that seem to cause pain along the costal cartilage or her titanium plate.  She will return to our office in approximately 6 months for routine follow-up.  I spent in excess of 15 minutes during the conduct of this office consultation and >50% of this time involved direct face-to-face encounter with the patient for counseling and/or coordination of their care.    Danielle Obrien. Danielle Manns, MD 05/16/2018 3:04 PM

## 2018-05-16 NOTE — Patient Instructions (Signed)

## 2018-06-09 ENCOUNTER — Ambulatory Visit (INDEPENDENT_AMBULATORY_CARE_PROVIDER_SITE_OTHER): Payer: Managed Care, Other (non HMO)

## 2018-06-09 DIAGNOSIS — Q231 Congenital insufficiency of aortic valve: Secondary | ICD-10-CM

## 2018-06-09 DIAGNOSIS — Z7901 Long term (current) use of anticoagulants: Secondary | ICD-10-CM | POA: Diagnosis not present

## 2018-06-09 DIAGNOSIS — Z954 Presence of other heart-valve replacement: Secondary | ICD-10-CM

## 2018-06-09 DIAGNOSIS — Q230 Congenital stenosis of aortic valve: Secondary | ICD-10-CM | POA: Diagnosis not present

## 2018-06-09 LAB — POCT INR: INR: 3.8 — AB (ref 2.0–3.0)

## 2018-06-09 NOTE — Patient Instructions (Signed)
Description   Skip tomorrow's dosage of Coumadin, then resume same dosage 1 tablet daily except 1.5 tablets on Sundays, Tuesdays and Thursdays.  Recheck in 2 weeks.  [facility] [phone] Main [phone]

## 2018-06-12 IMAGING — DX DG CHEST 2V
2 series · 2 of 2 positions shown · non-contrast
Comparison: Radiographs 11/24/2017

CLINICAL DATA: Cough.  Congestion.

EXAM:
CHEST - 2 VIEW

[chest pa]
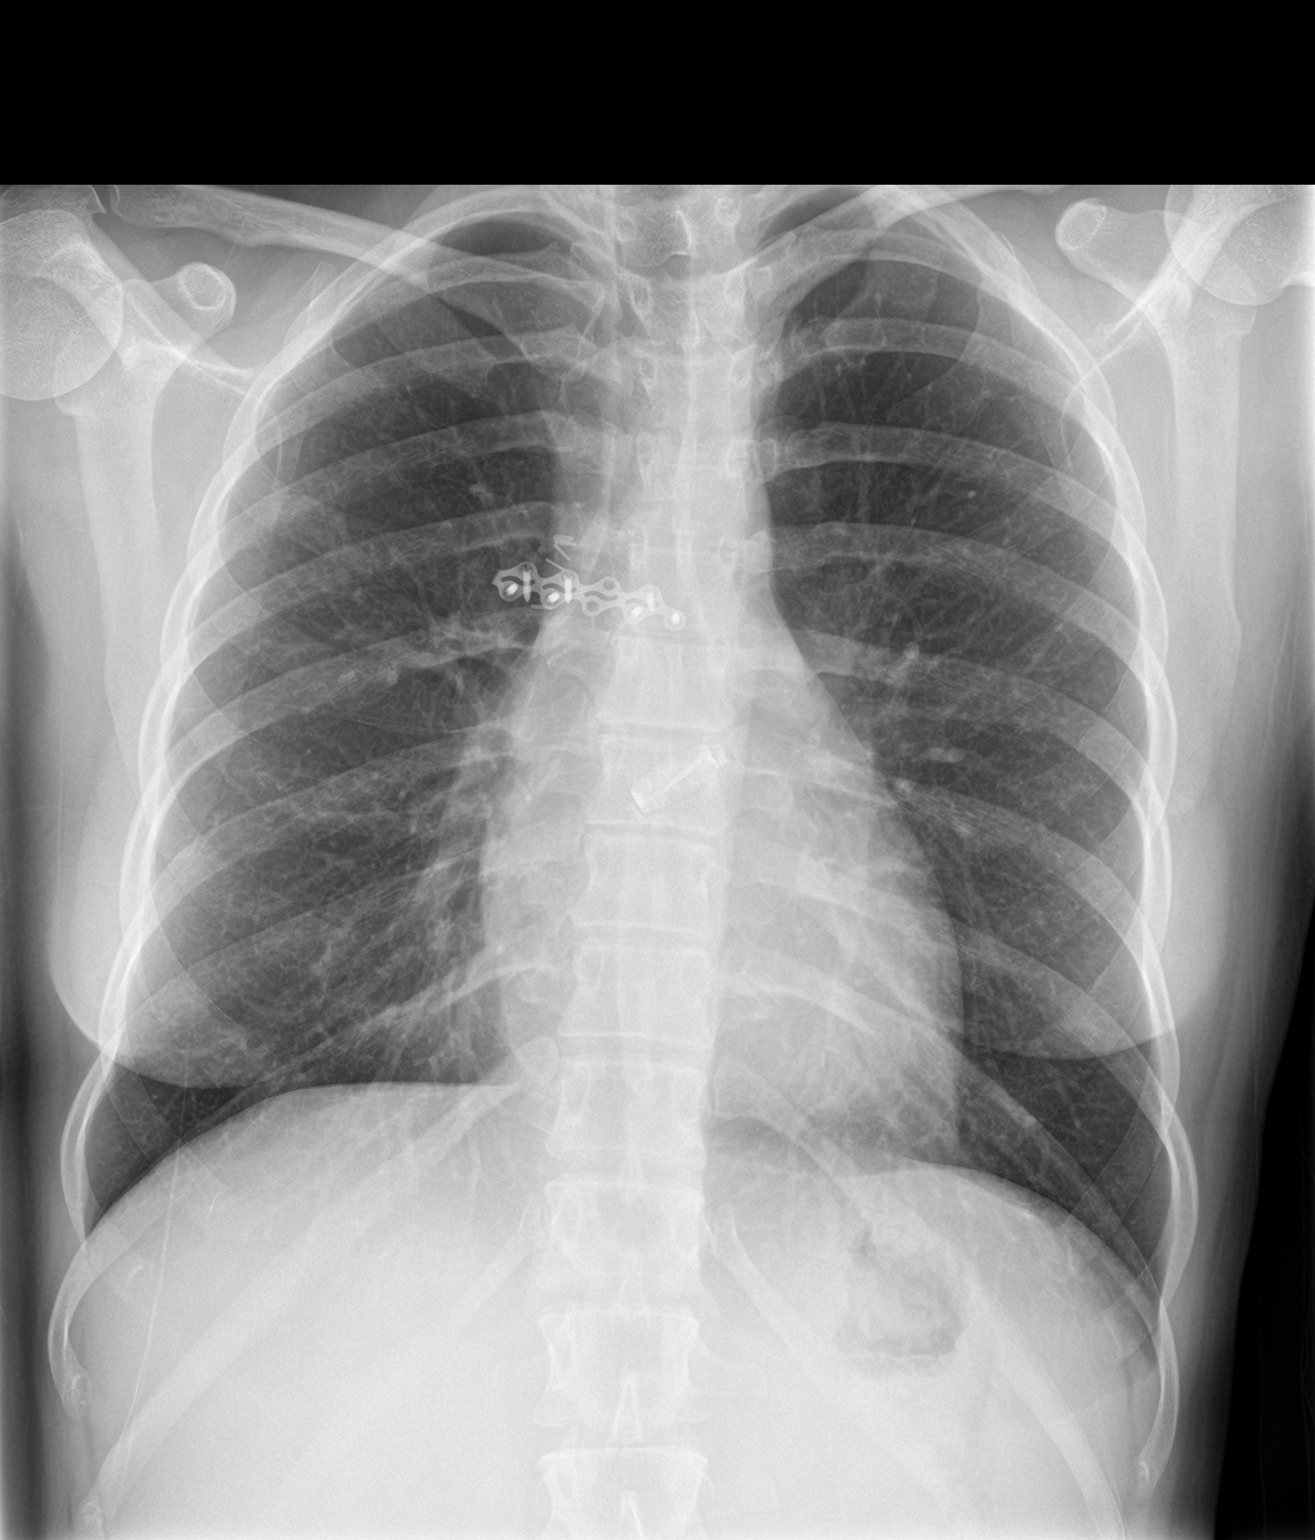

[chest lat]
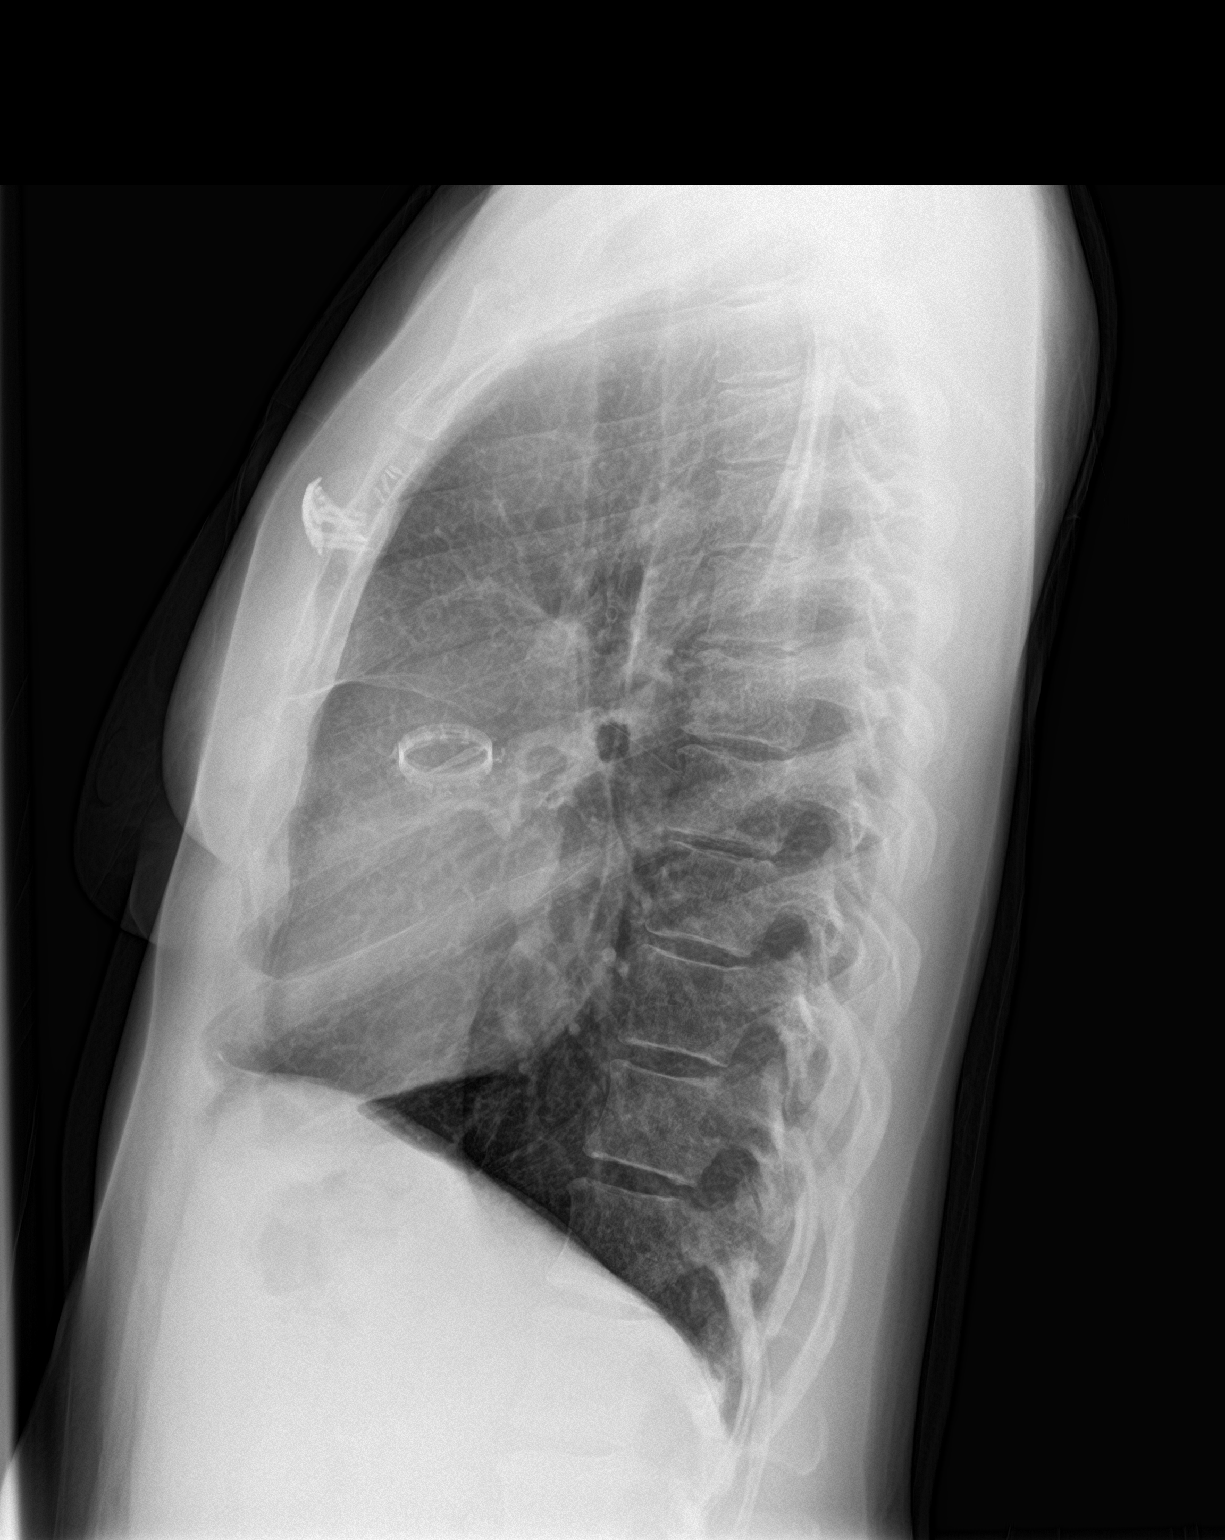

[2 of 2 positions shown; findings below may reference images not displayed]

FINDINGS: The cardiomediastinal contours are normal. Prosthetic aortic valve.
The lungs are clear. Pulmonary vasculature is normal. No
consolidation, pleural effusion, or pneumothorax. No acute osseous
abnormalities are seen. Sternal plate and screws unchanged.
IMPRESSION: No active cardiopulmonary disease.

## 2018-06-15 ENCOUNTER — Other Ambulatory Visit: Payer: Self-pay | Admitting: Family Medicine

## 2018-06-23 ENCOUNTER — Ambulatory Visit (INDEPENDENT_AMBULATORY_CARE_PROVIDER_SITE_OTHER): Payer: Managed Care, Other (non HMO) | Admitting: Pharmacist

## 2018-06-23 ENCOUNTER — Other Ambulatory Visit: Payer: Self-pay | Admitting: Family Medicine

## 2018-06-23 DIAGNOSIS — Q231 Congenital insufficiency of aortic valve: Secondary | ICD-10-CM | POA: Diagnosis not present

## 2018-06-23 DIAGNOSIS — Q230 Congenital stenosis of aortic valve: Secondary | ICD-10-CM | POA: Diagnosis not present

## 2018-06-23 DIAGNOSIS — Z7901 Long term (current) use of anticoagulants: Secondary | ICD-10-CM | POA: Diagnosis not present

## 2018-06-23 DIAGNOSIS — Z954 Presence of other heart-valve replacement: Secondary | ICD-10-CM | POA: Diagnosis not present

## 2018-06-23 LAB — POCT INR: INR: 2.3 (ref 2.0–3.0)

## 2018-06-23 NOTE — Patient Instructions (Signed)
Description   Continue same dosage 1 tablet daily except 1.5 tablets on Sundays, Tuesdays and Thursdays.  Recheck in 4 weeks.  [facility] [phone] Main [phone]

## 2018-06-23 NOTE — Telephone Encounter (Signed)
Refill done.  

## 2018-07-06 ENCOUNTER — Other Ambulatory Visit: Payer: Self-pay

## 2018-07-06 DIAGNOSIS — F411 Generalized anxiety disorder: Secondary | ICD-10-CM

## 2018-07-06 MED ORDER — BUSPIRONE HCL 30 MG PO TABS: ORAL_TABLET | ORAL | 1 refills | Status: DC

## 2018-07-07 ENCOUNTER — Other Ambulatory Visit: Payer: Self-pay | Admitting: Cardiology

## 2018-07-18 ENCOUNTER — Telehealth: Payer: Self-pay | Admitting: *Deleted

## 2018-07-18 NOTE — Telephone Encounter (Signed)
Received a form from [employer] from Refill dept asking if it is ok for pt to be on both warfarin and duloxetine and to let them know. Called and spoke with Micheal Likens at [facility] and advised that we know that the pt is on both and we are aware this increases the risk of bleeding. Micheal Likens stated thanks for calling. Pt has an INR appt on 07/21/18, will check INR at that time.

## 2018-07-19 ENCOUNTER — Other Ambulatory Visit: Payer: Self-pay | Admitting: Nurse Practitioner

## 2018-07-19 DIAGNOSIS — F3177 Bipolar disorder, in partial remission, most recent episode mixed: Secondary | ICD-10-CM

## 2018-07-19 DIAGNOSIS — F411 Generalized anxiety disorder: Secondary | ICD-10-CM

## 2018-07-20 ENCOUNTER — Other Ambulatory Visit: Payer: Self-pay | Admitting: Internal Medicine

## 2018-07-20 DIAGNOSIS — F411 Generalized anxiety disorder: Secondary | ICD-10-CM

## 2018-07-20 DIAGNOSIS — F3177 Bipolar disorder, in partial remission, most recent episode mixed: Secondary | ICD-10-CM

## 2018-07-21 ENCOUNTER — Ambulatory Visit (INDEPENDENT_AMBULATORY_CARE_PROVIDER_SITE_OTHER): Payer: Managed Care, Other (non HMO)

## 2018-07-21 DIAGNOSIS — Q231 Congenital insufficiency of aortic valve: Secondary | ICD-10-CM

## 2018-07-21 DIAGNOSIS — Q230 Congenital stenosis of aortic valve: Secondary | ICD-10-CM | POA: Diagnosis not present

## 2018-07-21 DIAGNOSIS — Z954 Presence of other heart-valve replacement: Secondary | ICD-10-CM

## 2018-07-21 DIAGNOSIS — Z7901 Long term (current) use of anticoagulants: Secondary | ICD-10-CM

## 2018-07-21 LAB — POCT INR: INR: 2.4 (ref 2.0–3.0)

## 2018-07-21 NOTE — Patient Instructions (Signed)
Description   Continue same dosage 1 tablet daily except 1.5 tablets on Sundays, Tuesdays and Thursdays.  Recheck in 4 weeks.  [facility] [phone] Main [phone]

## 2018-07-25 ENCOUNTER — Telehealth: Payer: Self-pay | Admitting: *Deleted

## 2018-07-25 NOTE — Telephone Encounter (Signed)
[place] re fax if pt may take Warfarin and Duloxetine.Discussed with Megan Supple Pharm D and this is okay ./cy

## 2018-08-17 ENCOUNTER — Other Ambulatory Visit: Payer: Self-pay | Admitting: Family Medicine

## 2018-08-17 NOTE — Telephone Encounter (Signed)
Refill done.  

## 2018-08-22 ENCOUNTER — Other Ambulatory Visit: Payer: Self-pay | Admitting: Family Medicine

## 2018-08-25 ENCOUNTER — Ambulatory Visit (INDEPENDENT_AMBULATORY_CARE_PROVIDER_SITE_OTHER): Payer: Managed Care, Other (non HMO) | Admitting: Pharmacist

## 2018-08-25 DIAGNOSIS — Q231 Congenital insufficiency of aortic valve: Secondary | ICD-10-CM | POA: Diagnosis not present

## 2018-08-25 DIAGNOSIS — Z7901 Long term (current) use of anticoagulants: Secondary | ICD-10-CM

## 2018-08-25 DIAGNOSIS — Q230 Congenital stenosis of aortic valve: Secondary | ICD-10-CM

## 2018-08-25 DIAGNOSIS — Z954 Presence of other heart-valve replacement: Secondary | ICD-10-CM

## 2018-08-25 LAB — POCT INR: INR: 3 (ref 2.0–3.0)

## 2018-08-25 NOTE — Patient Instructions (Signed)
Description   Continue same dosage 1 tablet daily except 1.5 tablets on Sundays, Tuesdays and Thursdays.  Recheck in 6 weeks.  [facility] #[phone] Main #[phone]     

## 2018-08-30 ENCOUNTER — Telehealth: Payer: Self-pay | Admitting: Cardiology

## 2018-08-30 NOTE — Telephone Encounter (Signed)
Left message to call back  

## 2018-08-30 NOTE — Telephone Encounter (Signed)
New Message:     Pt c/o of Chest Pain: STAT if CP now or developed within 24 hours  1. Are you having CP right now? Muscle Pain  2. Are you experiencing any other symptoms (ex. SOB, nausea, vomiting, sweating)? NO   3. How long have you been experiencing CP? Week  4. Is your CP continuous or coming and going? continuous  5. Have you taken Nitroglycerin? NA  ?

## 2018-09-01 ENCOUNTER — Other Ambulatory Visit: Payer: Self-pay | Admitting: Family Medicine

## 2018-09-01 DIAGNOSIS — F411 Generalized anxiety disorder: Secondary | ICD-10-CM

## 2018-09-02 ENCOUNTER — Emergency Department (HOSPITAL_COMMUNITY)
Admission: EM | Admit: 2018-09-02 | Discharge: 2018-09-03 | Disposition: A | Discharge status: Home / Self Care | Payer: Managed Care, Other (non HMO) | Attending: Emergency Medicine | Admitting: Emergency Medicine

## 2018-09-02 ENCOUNTER — Emergency Department (HOSPITAL_COMMUNITY): Payer: Managed Care, Other (non HMO)

## 2018-09-02 ENCOUNTER — Encounter (HOSPITAL_COMMUNITY): Payer: Self-pay | Admitting: Emergency Medicine

## 2018-09-02 DIAGNOSIS — R0789 Other chest pain: Secondary | ICD-10-CM | POA: Diagnosis not present

## 2018-09-02 DIAGNOSIS — Z7982 Long term (current) use of aspirin: Secondary | ICD-10-CM | POA: Insufficient documentation

## 2018-09-02 DIAGNOSIS — J4530 Mild persistent asthma, uncomplicated: Secondary | ICD-10-CM | POA: Diagnosis not present

## 2018-09-02 DIAGNOSIS — Z79899 Other long term (current) drug therapy: Secondary | ICD-10-CM | POA: Diagnosis not present

## 2018-09-02 DIAGNOSIS — Z7901 Long term (current) use of anticoagulants: Secondary | ICD-10-CM | POA: Insufficient documentation

## 2018-09-02 DIAGNOSIS — Z87891 Personal history of nicotine dependence: Secondary | ICD-10-CM | POA: Insufficient documentation

## 2018-09-02 DIAGNOSIS — S20219A Contusion of unspecified front wall of thorax, initial encounter: Secondary | ICD-10-CM

## 2018-09-02 DIAGNOSIS — Y999 Unspecified external cause status: Secondary | ICD-10-CM | POA: Insufficient documentation

## 2018-09-02 DIAGNOSIS — Y929 Unspecified place or not applicable: Secondary | ICD-10-CM | POA: Diagnosis not present

## 2018-09-02 DIAGNOSIS — Z952 Presence of prosthetic heart valve: Secondary | ICD-10-CM | POA: Diagnosis not present

## 2018-09-02 DIAGNOSIS — S20211A Contusion of right front wall of thorax, initial encounter: Secondary | ICD-10-CM | POA: Insufficient documentation

## 2018-09-02 DIAGNOSIS — I10 Essential (primary) hypertension: Secondary | ICD-10-CM | POA: Diagnosis not present

## 2018-09-02 DIAGNOSIS — Y939 Activity, unspecified: Secondary | ICD-10-CM | POA: Insufficient documentation

## 2018-09-02 DIAGNOSIS — W19XXXA Unspecified fall, initial encounter: Secondary | ICD-10-CM | POA: Insufficient documentation

## 2018-09-02 LAB — I-STAT TROPONIN, ED: TROPONIN I, POC: 0 ng/mL (ref 0.00–0.08)

## 2018-09-02 NOTE — Telephone Encounter (Signed)
Spoke with the patient about her chest cramping. It started 2 weeks ago and when it occurs she has chest pain that is 10/10 or worse. She falls to her knees when the cramp begins and can last anywhere from 30 seconds to 7 minutes. When it occurs she applies pressure to the chest and waits to the pain to stop. This occurs upwards of 4/5 times a day. Spoke with Dr. Radford Pax, she stated the patient should go to the ED to be further evaluated.   Spoke with the patient and she agreed to go to the ED.

## 2018-09-02 NOTE — ED Triage Notes (Signed)
Pt reports mechanical fall approx 4 weeks ago onto her chest. Reports that in the last two weeks she's had more pain. Sent by PCP for [employer]

## 2018-09-02 NOTE — ED Notes (Signed)
No reply for vitals x3. 

## 2018-09-03 ENCOUNTER — Emergency Department (HOSPITAL_COMMUNITY): Payer: Managed Care, Other (non HMO)

## 2018-09-03 LAB — CBC WITH DIFFERENTIAL/PLATELET
ABS IMMATURE GRANULOCYTES: 0 10*3/uL (ref 0.0–0.1)
BASOS ABS: 0.1 10*3/uL (ref 0.0–0.1)
BASOS PCT: 1 %
EOS PCT: 3 %
Eosinophils Absolute: 0.2 10*3/uL (ref 0.0–0.7)
HCT: 46.1 % — ABNORMAL HIGH (ref 36.0–46.0)
HEMOGLOBIN: 15.1 g/dL — AB (ref 12.0–15.0)
Immature Granulocytes: 0 %
LYMPHS ABS: 3 10*3/uL (ref 0.7–4.0)
LYMPHS PCT: 44 %
MCH: 30.9 pg (ref 26.0–34.0)
MCHC: 32.8 g/dL (ref 30.0–36.0)
MCV: 94.5 fL (ref 78.0–100.0)
MONOS PCT: 7 %
Monocytes Absolute: 0.5 10*3/uL (ref 0.1–1.0)
NEUTROS ABS: 3 10*3/uL (ref 1.7–7.7)
NEUTROS PCT: 45 %
PLATELETS: 160 10*3/uL (ref 150–400)
RBC: 4.88 MIL/uL (ref 3.87–5.11)
RDW: 13.2 % (ref 11.5–15.5)
WBC: 6.8 10*3/uL (ref 4.0–10.5)

## 2018-09-03 LAB — BASIC METABOLIC PANEL
Anion gap: 9 (ref 5–15)
BUN: 12 mg/dL (ref 6–20)
CO2: 25 mmol/L (ref 22–32)
CREATININE: 0.82 mg/dL (ref 0.44–1.00)
Calcium: 9 mg/dL (ref 8.9–10.3)
Chloride: 103 mmol/L (ref 98–111)
Glucose, Bld: 94 mg/dL (ref 70–99)
Potassium: 4.1 mmol/L (ref 3.5–5.1)
SODIUM: 137 mmol/L (ref 135–145)

## 2018-09-03 LAB — I-STAT TROPONIN, ED: Troponin i, poc: 0 ng/mL (ref 0.00–0.08)

## 2018-09-03 LAB — PROTIME-INR
INR: 2.42
PROTHROMBIN TIME: 26.1 s — AB (ref 11.4–15.2)

## 2018-09-03 MED ORDER — IOHEXOL 300 MG/ML  SOLN
75.0000 mL | Freq: Once | INTRAMUSCULAR | Status: AC | PRN
  Administered 2018-09-03: 75 mL via INTRAVENOUS

## 2018-09-03 MED ORDER — OXYCODONE-ACETAMINOPHEN 5-325 MG PO TABS
1.0000 | ORAL_TABLET | Freq: Once | ORAL | Status: AC
  Administered 2018-09-03: 1 via ORAL
  Filled 2018-09-03: qty 1

## 2018-09-03 NOTE — Discharge Instructions (Signed)
Your testing is negative for fracture or lung problem. There is no evidence of damage done to your heart. Followup with your doctor. Return to the ED if you develop new or worsening symptoms.

## 2018-09-03 NOTE — ED Provider Notes (Signed)
[facility] EMERGENCY DEPARTMENT Provider Note   CSN: [id number] Arrival date & time: 09/02/18  2055     History   Chief Complaint Chief Complaint  Patient presents with  . Chest Pain  . Fall    HPI Danielle Obrien is a 37 y.o. female.  Patient with history of mechanical aortic valve replacement for aortic stenosis in December 2018, on coumadin, presenting with 1 month of intermittent R and central chest pain. Pain becoming worse over the past 2 weeks. States she had a fall onto concrete about a month ago, striking her chest and the pain began after that. She describes sharp pain and "spasm" at the center of her chest that lasts for several seconds or minutes at a time and improves when she holds pressure there with her hand. She has been taking APAP and tramadol at home. Pain is not exertional or pleuritic. No SOB, nausea, diaphoresis, vomiting. She called her cardiologist's office and was told to come to the ED "for Xrays" (on Oct 1). Denies hitting her head when she fell. Pain is similar to discomfort she had after her surgery and she is worried that her "plate has moved".   The history is provided by the patient and a relative.    Past Medical History:  Diagnosis Date  . Abnormal Pap smear of cervix    --recurrent ascus w/Pos. HR HPV  . ADD (attention deficit disorder)   . Alcohol abuse, in remission 2012  . Allergy   . Anxiety   . Aortic stenosis due to bicuspid aortic valve 02/20/2014  . Arthritis    In hips  . Asthma    rare inhaler use  . Bipolar disorder ([place])   . Headache   . Hyperlipidemia with target LDL less than 130 01/27/2013  . Hypertension   . Muscle spasms of both lower extremities    both hips  . 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 Tobacco abuse 09/27/2015  . 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 RTI (respiratory tract infection) 02/01/2018  . Long term (current) use of anticoagulants [Z79.01] 11/11/2017  . 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 Chronic, continuous use of opioids 09/06/2017  . Tobacco abuse 09/27/2015  . Acute non-recurrent maxillary sinusitis 08/20/2015  . SI (sacroiliac) joint dysfunction 02/20/2015  . Leg length discrepancy 02/20/2015  . Allergic rhinitis, cause unspecified 05/04/2014  . Aortic stenosis due to bicuspid aortic valve 02/20/2014  . Bipolar disorder ([place]) 02/05/2014  . Migraine with status migrainosus 01/27/2013  . Hyperlipidemia with target LDL less than 130 01/27/2013  . Mild persistent asthma 04/21/2011  . Essential hypertension, benign 04/21/2011  . Cough 04/21/2011    Past Surgical History:  Procedure Laterality Date  . ABDOMINAL HYSTERECTOMY  02/06/2009   Robotic total laparoscopic hysterectomy  . ANTERIOR CRUCIATE LIGAMENT REPAIR  1993  . AORTIC VALVE REPLACEMENT N/A 11/03/2017   Procedure: MINIMALLY INVASIVE AORTIC VALVE REPLACEMENT( MINI THORACOTOMY);  Surgeon: Rexene Alberts, MD;  Location: [place];  Service: Open Heart Surgery;  Laterality: N/A;  . CARDIAC CATHETERIZATION  June 2015   no CAD - moderate AS noted  . 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 COLPOSCOPY  10/2008   CIN I & II  . COLPOSCOPY  07/2000   Neg. ECC  . COLPOSCOPY  06/2001   CIN I  . COLPOSCOPY  08/2004   ECC--atypia  . COLPOSCOPY N/A 01/21/2016   Procedure: COLPOSCOPY with vaginal biopsy with CO 2 Laser of Vaginal and vulvar condyloma;  Surgeon: [name], MD;  Location: [facility];  Service: Gynecology;  Laterality: N/A;  [name] will be here 2/21 for 1115 case confirmed 01/16/15 - TS  . HERNIA REPAIR  1981/1982    . HIP SURGERY  1981   Hip Reset   . HIP SURGERY  1990   Plate was reconstructed/ took out growth plate in Right knee  . HIP SURGERY  1992   Plate removed in Left hip  . LEFT AND RIGHT HEART CATHETERIZATION WITH CORONARY ANGIOGRAM N/A 05/18/2014   Procedure: LEFT AND RIGHT HEART CATHETERIZATION WITH CORONARY ANGIOGRAM;  Surgeon: Jettie Booze, MD;  Location: [facility] CATH LAB;  Service: Cardiovascular;  Laterality: N/A;  . LYMPH NODE BIOPSY  1995  . NASAL SEPTUM SURGERY  2002  . TEE WITHOUT CARDIOVERSION N/A 11/03/2017   Procedure: TRANSESOPHAGEAL ECHOCARDIOGRAM (TEE);  Surgeon: Rexene Alberts, MD;  Location: [place];  Service: Open Heart Surgery;  Laterality: N/A;  . TONSILLECTOMY AND ADENOIDECTOMY  1990  . TYMPANOSTOMY TUBE PLACEMENT  1981/1982     OB History    Gravida  1   Para      Term      Preterm      AB  1   Living  0     SAB      TAB      Ectopic      Multiple      Live Births               Home Medications    Prior to Admission medications   Medication Sig Start Date End Date Taking? Authorizing Provider  albuterol (PROVENTIL HFA;VENTOLIN HFA) 108 (90 Base) MCG/ACT inhaler Inhale 2 puffs into the lungs every 6 (six) hours as needed for wheezing or shortness of breath. 02/08/18  Yes Janith Lima, MD  aspirin EC 81 MG tablet Take 1 tablet (81 mg total) by mouth daily. 01/20/18  Yes Turner, Eber Hong, MD  busPIRone (BUSPAR) 30 MG tablet TAKE ONE TABLET BY MOUTH TWICE A DAY. TAKE AT 10AM AND 5PM Patient taking differently: Take 30 mg by mouth 2 (two) times daily.  09/01/18  Yes Lyndal Pulley, DO  DULoxetine (CYMBALTA) 20 MG capsule TAKE ONE CAPSULE BY MOUTH DAILY Patient taking differently: Take 20 mg by mouth daily.  06/23/18  Yes Lyndal Pulley, DO  gabapentin (NEURONTIN) 300 MG capsule TAKE 2 CAPSULES BY MOUTH THREE TIMES A DAY Patient taking differently: Take 600 mg by mouth 3 (three) times daily.  08/17/18  Yes Lyndal Pulley, DO  lamoTRIgine  (LAMICTAL) 200 MG tablet TAKE 2 TABLETS (400 MG TOTAL) BY MOUTH DAILY. Patient taking differently: Take 400 mg by mouth daily.  07/20/18  Yes Janith Lima, MD  lansoprazole (PREVACID) 15 MG capsule Take 15 mg by mouth daily as needed (for heartburn or acid reflux).    Yes [provider]  metoprolol tartrate (LOPRESSOR) 25 MG tablet Take 0.5 tablets (12.5 mg total) by mouth 2 (two) times daily. Patient taking differently: Take 12.5 mg by mouth daily.  05/10/18  Yes Turner, Eber Hong, MD  traMADol (ULTRAM) 50 MG tablet TAKE TWO TABLETS BY MOUTH TWICE A DAY Patient taking differently: Take 100 mg by mouth 2 (two) times daily.  08/22/18  Yes Lyndal Pulley, DO  warfarin (COUMADIN) 7.5 MG tablet TAKE ONE TABLET BY MOUTH DAILY AT 6:00 PM 40 IS 30 DAYS SUPPLY Patient taking differently: Take 7.5-11.25 mg by mouth See admin instructions. Take 1 and 1/2 tablets on Tuesday, Thursday and Sunday then take 1 tablet all the other days 07/07/18  Yes Turner, Eber Hong, MD  warfarin (COUMADIN) 7.5 MG tablet Take as directed by [facility] Patient not taking: Reported on 09/03/2018 04/08/18   Sueanne Margarita, MD    Family History Family History  Problem Relation Age of Onset  . Hyperlipidemia Mother   . Hypertension Mother   . Thyroid disease Mother   . Hyperlipidemia Father   . Hypertension Father   . Migraines Father   . Hypertension Brother   . Hyperlipidemia Brother   . Thyroid disease Maternal Grandmother   . Thyroid disease Maternal Grandfather   . Cancer Neg Hx     Social History Social History   Tobacco Use  . Smoking status: Former Smoker    Packs/day: 0.50    Years: 12.00    Pack years: 6.00    Types: Cigarettes    Last attempt to quit: 11/02/2017    Years since quitting: 0.8  . Smokeless tobacco: Never Used  . Tobacco comment: pt given fake cigarette for hand/mouth association. pt using [facility] for support  Substance Use Topics  . Alcohol use: No     Alcohol/week: 0.0 standard drinks  . Drug use: No     Allergies   Demerol; Ibuprofen; and Codeine   Review of Systems Review of Systems  Constitutional: Negative for activity change, appetite change and fever.  HENT: Negative for congestion.   Eyes: Negative for visual disturbance.  Respiratory: Positive for chest tightness. Negative for cough and shortness of breath.   Cardiovascular: Positive for chest pain.  Gastrointestinal: Negative for abdominal pain, diarrhea and vomiting.  Genitourinary: Negative for dysuria and hematuria.  Musculoskeletal: Negative for arthralgias, back pain and myalgias.  Skin: Negative for rash.  Neurological: Negative for dizziness, weakness and headaches.    all other systems are negative except as noted in the HPI and PMH.    Physical Exam Updated Vital Signs BP 134/79   Pulse 64   Temp 98.3 F (36.8 C) (Oral)   Resp 16   Ht 5' (1.524 m)   Wt 54 kg   SpO2 100%   BMI 23.24 kg/m   Physical Exam  Constitutional: She is oriented to person, place, and time. She appears well-developed and well-nourished. No distress.  HENT:  Head: Normocephalic and atraumatic.  Mouth/Throat: Oropharynx is clear and moist. No oropharyngeal exudate.  Eyes: Pupils are equal, round, and reactive to light. Conjunctivae and EOM are normal.  Neck: Normal range of motion. Neck supple.  No meningismus.  Cardiovascular: Normal rate, regular rhythm, normal heart sounds and intact distal pulses.  No murmur heard. Pulmonary/Chest: Effort normal and breath sounds normal. No respiratory distress. She exhibits tenderness.  Well healed mini thoracotomy incisions.  Pain to palpation of lower sternum and xiphoid process  Abdominal: Soft. There is no tenderness. There is no rebound and no guarding.  Musculoskeletal: Normal range of motion. She exhibits no edema or tenderness.  Neurological: She is alert and oriented to  person, place, and time. No cranial nerve deficit. She  exhibits normal muscle tone. Coordination normal.  No ataxia on finger to nose bilaterally. No pronator drift. 5/5 strength throughout. CN 2-12 intact.Equal grip strength. Sensation intact.   Skin: Skin is warm.  Psychiatric: She has a normal mood and affect. Her behavior is normal.  Nursing note and vitals reviewed.    ED Treatments / Results  Labs (all labs ordered are listed, but only abnormal results are displayed) Labs Reviewed  CBC WITH DIFFERENTIAL/PLATELET - Abnormal; Notable for the following components:      Result Value   Hemoglobin 15.1 (*)    HCT 46.1 (*)    All other components within normal limits  PROTIME-INR - Abnormal; Notable for the following components:   Prothrombin Time 26.1 (*)    All other components within normal limits  BASIC METABOLIC PANEL  I-STAT TROPONIN, ED  I-STAT TROPONIN, ED    EKG EKG Interpretation  Date/Time:  Friday September 02 2018 21:09:33 EDT Ventricular Rate:  84 PR Interval:  138 QRS Duration: 86 QT Interval:  376 QTC Calculation: 444 R Axis:   86 Text Interpretation:  Normal sinus rhythm Septal infarct , age undetermined Abnormal ECG No significant change was found Confirmed by Ezequiel Essex [phone]) on 09/03/2018 1:23:25 AM   Radiology Dg Chest 2 View  Result Date: 09/02/2018 CLINICAL DATA:  Fall, right chest pain, history of aortic valve replacement EXAM: CHEST - 2 VIEW COMPARISON:  02/01/2018 FINDINGS: Postop changes from aortic valve replacement. Normal heart size and vascularity. Lungs remain clear. No focal pneumonia, collapse or consolidation. Negative for edema, effusion or pneumothorax. Trachea is midline. No acute osseous finding. Minor curvature of the thoracic spine. IMPRESSION: Stable postoperative findings.  No acute chest process. Electronically Signed   By: Jerilynn Mages.  Shick M.D.   On: 09/02/2018 22:46   Ct Chest W Contrast  Result Date: 09/03/2018 CLINICAL DATA:  Fall 4 weeks ago.  Chest pain. EXAM: CT CHEST WITH  CONTRAST TECHNIQUE: Multidetector CT imaging of the chest was performed during intravenous contrast administration. CONTRAST:  56mL OMNIPAQUE IOHEXOL 300 MG/ML  SOLN COMPARISON:  Chest radiographs yesterday.  Coronary CTA 09/27/2017 FINDINGS: Cardiovascular: Post aortic valve replacement. The ascending aorta measures 3.2 cm, previously 3.1 cm. No dissection. Common origin of the brachiocephalic and left common carotid artery, bovine arch anatomy. No evidence of acute aortic injury. Small amount of retrosternal stranding is likely postsurgical. Heart is normal in size. No significant pericardial effusion. Mediastinum/Nodes: No pneumomediastinum. No adenopathy. The esophagus is decompressed. Visualized thyroid gland is normal. Lungs/Pleura: No pneumothorax or pulmonary contusion. Mild bronchial thickening. Probable scarring in the right middle lobe. Mild dependent atelectasis in both lower lobes. No pleural fluid. Upper Abdomen: No acute findings or evidence of traumatic injury. Musculoskeletal: Plate fixation of right chondro sternal junction. No fracture of the ribs, thoracic spine, sternum, included shoulder girdles and clavicles. IMPRESSION: 1. No evidence of traumatic injury to the thorax. 2. Mild central bronchial thickening. Electronically Signed   By: Keith Rake M.D.   On: 09/03/2018 03:17    Procedures Procedures (including critical care time)  Medications Ordered in ED Medications  oxyCODONE-acetaminophen (PERCOCET/ROXICET) 5-325 MG per tablet 1 tablet (1 tablet Oral Given 09/03/18 0141)  iohexol (OMNIPAQUE) 300 MG/ML solution 75 mL (75 mLs Intravenous Contrast Given 09/03/18 0254)     Initial Impression / Assessment and Plan / ED Course  I have reviewed the triage vital signs and the nursing notes.  Pertinent labs &  imaging results that were available during my care of the patient were reviewed by me and considered in my medical decision making (see chart for details).     1 month of  chest discomfort after blunt trauma.  Pain lasting for minutes to hours at a time, ongoing for weeks. Improved with holding pressure on chest wall.  EKG unchanged and nonischemic. CT coronary in 2018 was negative. Very low suspicion for ACS. CXR shows no rib fracture or PTX.  With coumadin use and ongoing pain, CT obtained to evaluate for hematoma, occult fracture or other injury.   This was negative. Troponin negative x2.  Workup consistent with chest wall pain, likely contusion.  No evidence of ACS, PE, aortic dissection.  Patient to take her home tramadol and followup with her cardiologist.  Return precautions discussed.  Final Clinical Impressions(s) / ED Diagnoses   Final diagnoses:  Atypical chest pain  Contusion of chest wall, unspecified laterality, initial encounter    ED Discharge Orders    None       Koi Zangara, Annie Main, MD 09/04/18 0159

## 2018-09-03 NOTE — ED Notes (Signed)
Patient transported to CT 

## 2018-09-06 ENCOUNTER — Ambulatory Visit (HOSPITAL_COMMUNITY): Payer: Managed Care, Other (non HMO) | Attending: Cardiology

## 2018-09-06 ENCOUNTER — Other Ambulatory Visit: Payer: Self-pay

## 2018-09-06 ENCOUNTER — Ambulatory Visit: Payer: Managed Care, Other (non HMO) | Admitting: Cardiology

## 2018-09-06 ENCOUNTER — Telehealth: Payer: Self-pay

## 2018-09-06 DIAGNOSIS — S20219D Contusion of unspecified front wall of thorax, subsequent encounter: Secondary | ICD-10-CM

## 2018-09-06 DIAGNOSIS — Z954 Presence of other heart-valve replacement: Secondary | ICD-10-CM | POA: Insufficient documentation

## 2018-09-06 DIAGNOSIS — R0789 Other chest pain: Secondary | ICD-10-CM | POA: Insufficient documentation

## 2018-09-06 IMAGING — DX DG PELVIS 1-2V
1 series · 1 of 1 positions shown · non-contrast
Comparison: None.

CLINICAL DATA: Chronic left hip pain. History of multiple hip
surgical procedures.

EXAM:
PELVIS - 1-2 VIEW

[pelvis ap]
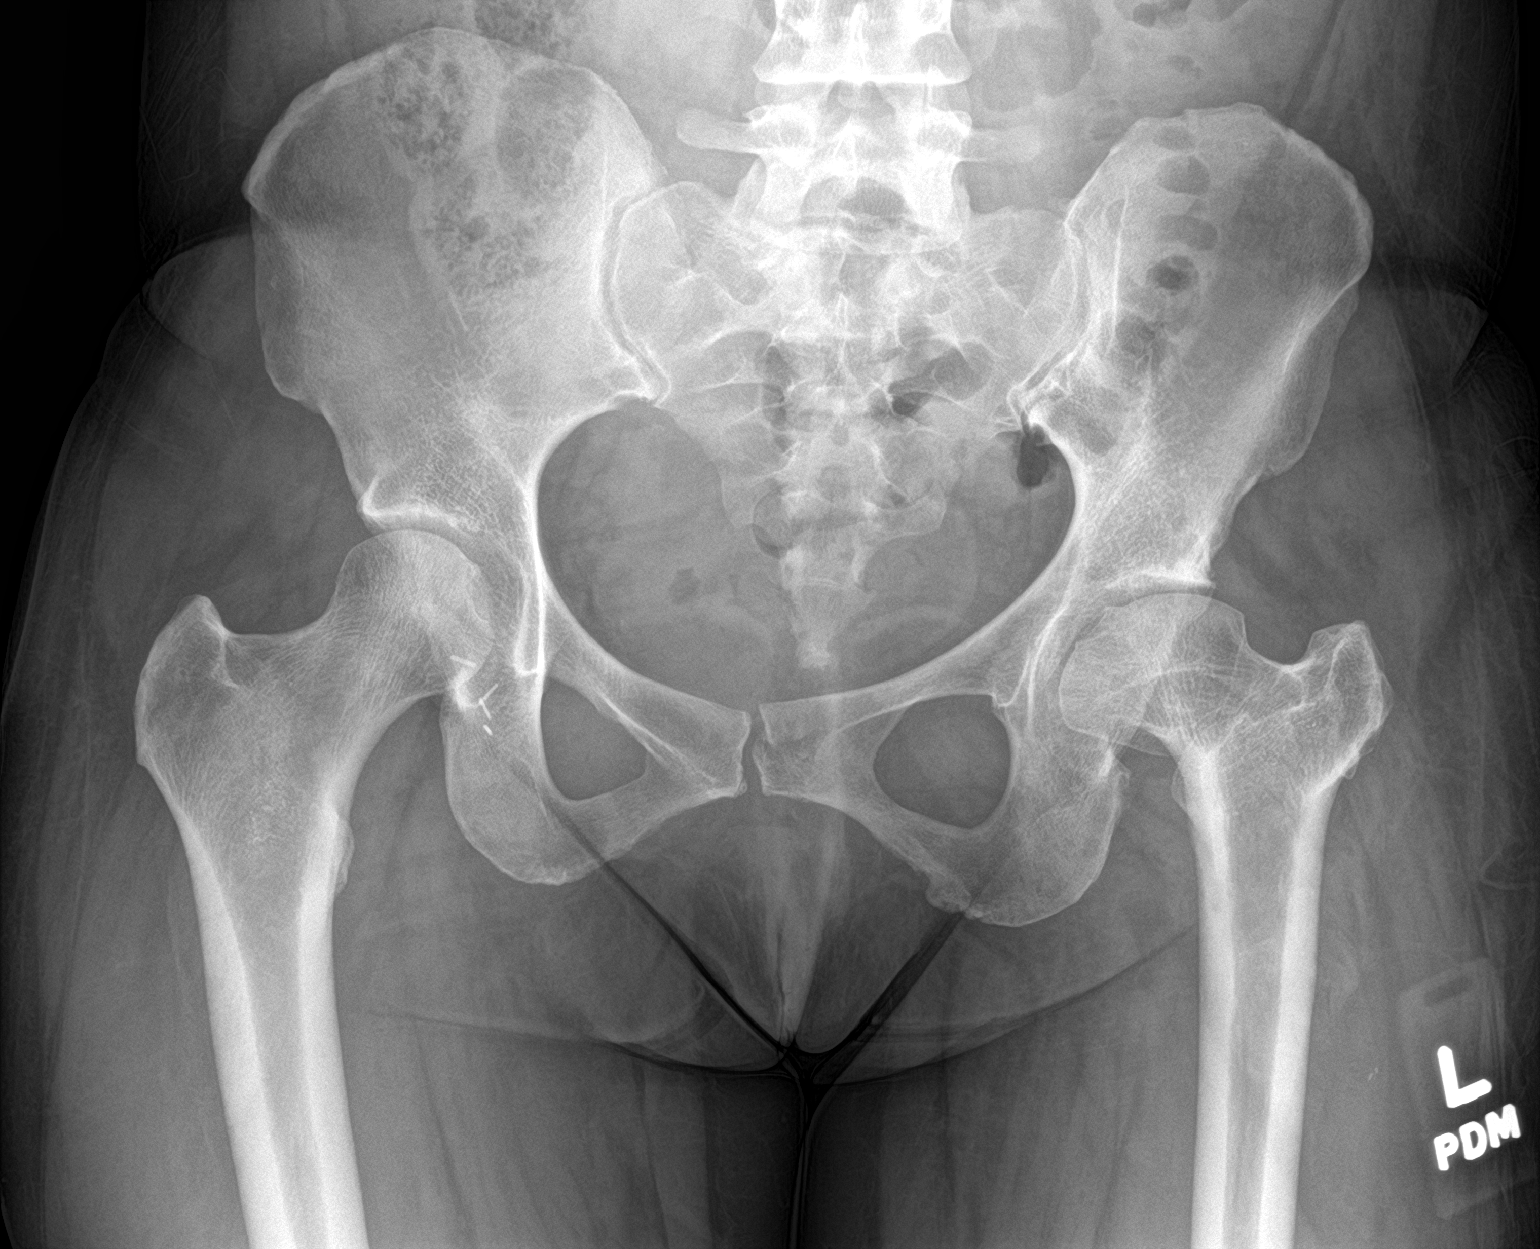

[1 of 1 positions shown; findings below may reference images not displayed]

FINDINGS: There is deformity of the left femoral head with flattening and
widening of the head and foreshortening of the femoral neck. There
is also some deformity of the left greater trochanter. The left
iliac bone is smaller than the right iliac bone. Normal appearing
right hip with overlying groin surgical clips. Normal appearing
lower lumbar spine.
IMPRESSION: 1. Left hip deformity, most likely due to healed subcapital femoral
epiphysis.
2. Small left iliac bone compared to the right.

## 2018-09-06 NOTE — Telephone Encounter (Signed)
-----   Message from Sueanne Margarita, MD sent at 09/05/2018  5:21 PM EDT ----- Reviewed ER visit note.  Patient has a mechanical aortic valve and is on Coumadin therapy.  Apparently she had a fall landing on her chest wall and went to the emergency room for evaluation due to chest wall pain that was sharp.  Cardiac work-up was normal.  She does have a history of normal coronaries by cath in the past.  It was felt that she had a chest wall contusion but she was concerned about sternal wound.  It was recommended she follow-up with cardiology.  After reviewing the findings I recommended that we get a 2D echocardiogram to make sure that there is normal functioning of the aortic valve prosthesis.  In regards to problems with her sternal repair after surgery I recommend that she actually follow-up with CVTS surgery.

## 2018-09-06 NOTE — Telephone Encounter (Signed)
Spoke with the patient, she agreed to seek out CT Surgery appointment. An echo was placed and scheduled for today at 9:30 to evaluate the heart structure.

## 2018-09-13 ENCOUNTER — Telehealth: Payer: Self-pay

## 2018-09-13 NOTE — Telephone Encounter (Signed)
Spoke with patient regarding pap recall.  Appointment scheduled for December.  Patient has some family obligations.

## 2018-10-03 ENCOUNTER — Other Ambulatory Visit: Payer: Self-pay | Admitting: Family Medicine

## 2018-10-03 ENCOUNTER — Other Ambulatory Visit: Payer: Self-pay | Admitting: Cardiology

## 2018-10-03 DIAGNOSIS — F411 Generalized anxiety disorder: Secondary | ICD-10-CM

## 2018-10-06 ENCOUNTER — Ambulatory Visit (INDEPENDENT_AMBULATORY_CARE_PROVIDER_SITE_OTHER): Payer: Managed Care, Other (non HMO)

## 2018-10-06 DIAGNOSIS — Q231 Congenital insufficiency of aortic valve: Secondary | ICD-10-CM

## 2018-10-06 DIAGNOSIS — Z7901 Long term (current) use of anticoagulants: Secondary | ICD-10-CM

## 2018-10-06 DIAGNOSIS — Q230 Congenital stenosis of aortic valve: Secondary | ICD-10-CM

## 2018-10-06 DIAGNOSIS — Z954 Presence of other heart-valve replacement: Secondary | ICD-10-CM

## 2018-10-06 LAB — POCT INR: INR: 2.7 (ref 2.0–3.0)

## 2018-10-06 NOTE — Patient Instructions (Signed)
Description   Continue same dosage 1 tablet daily except 1.5 tablets on Sundays, Tuesdays and Thursdays.  Recheck in 6 weeks.  [facility] [phone] Main [phone]

## 2018-10-13 ENCOUNTER — Encounter: Payer: Self-pay | Admitting: Family Medicine

## 2018-10-13 ENCOUNTER — Ambulatory Visit (INDEPENDENT_AMBULATORY_CARE_PROVIDER_SITE_OTHER): Payer: Managed Care, Other (non HMO) | Admitting: Family Medicine

## 2018-10-13 DIAGNOSIS — J029 Acute pharyngitis, unspecified: Secondary | ICD-10-CM | POA: Diagnosis not present

## 2018-10-13 DIAGNOSIS — J0140 Acute pansinusitis, unspecified: Secondary | ICD-10-CM | POA: Diagnosis not present

## 2018-10-13 LAB — POCT RAPID STREP A (OFFICE): RAPID STREP A SCREEN: NEGATIVE

## 2018-10-13 MED ORDER — AMOXICILLIN-POT CLAVULANATE 875-125 MG PO TABS: 1.0000 | ORAL_TABLET | Freq: Two times a day (BID) | ORAL | 0 refills | Status: DC

## 2018-10-13 NOTE — Progress Notes (Signed)
Patient ID: Danielle Obrien, female   DOB: [birth year], 37 y.o.   MRN: [id number]  PCP: Janith Lima, MD  Subjective:  Danielle Obrien is a 37 y.o. year old very pleasant female patient who presents with symptoms including nasal congestion, rhinitis, sore throat, cough that is productive of green/brown sputum in the morning only.  -started: 2 weeks ago, symptoms are not improving. Sinus pressure and congestion is most bothersome symptom -previous treatments: Tylenol sinus/headache -sick contacts/travel/risks: denies flu exposure. Recent sick contact exposure at work -Hx of: allergies  ROS-denies fever, SOB, NVD, tooth pain  Pertinent Past Medical History- HTN, Aortic stenosis due to bicuspid aortic valve, valve replacement  Medications- reviewed  Current Outpatient Medications  Medication Sig Dispense Refill  . albuterol (PROVENTIL HFA;VENTOLIN HFA) 108 (90 Base) MCG/ACT inhaler Inhale 2 puffs into the lungs every 6 (six) hours as needed for wheezing or shortness of breath. 1 Inhaler 3  . aspirin EC 81 MG tablet Take 1 tablet (81 mg total) by mouth daily. 90 tablet 3  . busPIRone (BUSPAR) 30 MG tablet TAKE ONE TABLET BY MOUTH TWICE A DAY. TAKE AT 10AM AND 5PM 60 tablet 0  . DULoxetine (CYMBALTA) 20 MG capsule TAKE ONE CAPSULE BY MOUTH DAILY (Patient taking differently: Take 20 mg by mouth daily. ) 90 capsule 1  . gabapentin (NEURONTIN) 300 MG capsule TAKE 2 CAPSULES BY MOUTH THREE TIMES A DAY (Patient taking differently: Take 600 mg by mouth 3 (three) times daily. ) 360 capsule 1  . lamoTRIgine (LAMICTAL) 200 MG tablet TAKE 2 TABLETS (400 MG TOTAL) BY MOUTH DAILY. (Patient taking differently: Take 400 mg by mouth daily. ) 180 tablet 1  . lansoprazole (PREVACID) 15 MG capsule Take 15 mg by mouth daily as needed (for heartburn or acid reflux).     . metoprolol tartrate (LOPRESSOR) 25 MG tablet Take 0.5 tablets (12.5 mg total) by mouth 2 (two) times daily. (Patient taking differently: Take  12.5 mg by mouth daily. ) 90 tablet 3  . traMADol (ULTRAM) 50 MG tablet TAKE TWO TABLETS BY MOUTH TWICE A DAY (Patient taking differently: Take 100 mg by mouth 2 (two) times daily. ) 120 tablet 2  . warfarin (COUMADIN) 7.5 MG tablet Take as directed by [facility] 40 tablet 1  . warfarin (COUMADIN) 7.5 MG tablet Take 1 tab daily except 1.5 tabs on Sun, Tues, Thurs or take as directed by [facility]. 40 tablet 3   No current facility-administered medications for this visit.     Objective: BP 132/84 (BP Location: Left Arm, Patient Position: Sitting, Cuff Size: Normal)   Pulse 74   Temp 97.9 F (36.6 C) (Oral)   Ht 5' (1.524 m)   Wt 121 lb (54.9 kg)   SpO2 98%   BMI 23.63 kg/m  Gen: NAD, resting comfortably HEENT: Turbinates erythematous, TMs normal bilaterally, pharynx mildly erythematous with no exudate or edema, + frontal and maxillary sinus tenderness CV: RRR no murmurs rubs or gallops Lungs: CTAB no crackles, wheeze, rhonchi Abdomen: soft/nontender/nondistended/normal bowel sounds. No rebound or guarding.  Ext: no edema Skin: warm, dry, no rash Neuro: grossly normal, moves all extremities  Assessment/Plan: 1. Acute pansinusitis, recurrence not specified Exam and history with duration of symptoms support empiric treatment for sinusitis. Advised patient on supportive measures:  Get rest, drink plenty of fluids, and use tylenol as needed for pain. She is currently taking warfarin and she has INR checks at her cardiac provider. She will contact cardiac office  today to report initiation of Augmentin and schedule INR check. She prefers to have this checked at their office and adjustments made if needed.  Follow up if fever >101, if symptoms worsen or if symptoms are not improved in 3-4 days. Patient  verbalizes understanding and agrees with plan.  2. Sore throat Rapid strep is negative today. Advised use of acetaminophen and warm, salt water gargles for symptoms. Suspect that  symptom is from post nasal drip.    POCT rapid strep A  Finally, we reviewed reasons to return to care including if symptoms worsen or persist or new concerns arise- once again particularly shortness of breath or fever.   Laurita Quint, FNP

## 2018-10-13 NOTE — Patient Instructions (Signed)
Please take medication with food as directed.  Please drink plenty of water so that your urine is pale yellow or clear. Also, get plenty of rest, use tylenol as needed for discomfort and warm salt water gargles can be used for your throat. Follow up if symptoms do not improve in 3 to 4 days, worsen, or you develop a fever >101.  Please contact your cardiac provider to schedule a recheck of your blood as you are taking warfarin.   Sinusitis, Adult Sinusitis is soreness and inflammation of your sinuses. Sinuses are hollow spaces in the bones around your face. They are located:  Around your eyes.  In the middle of your forehead.  Behind your nose.  In your cheekbones.  Your sinuses and nasal passages are lined with a stringy fluid (mucus). Mucus normally drains out of your sinuses. When your nasal tissues get inflamed or swollen, the mucus can get trapped or blocked so air cannot flow through your sinuses. This lets bacteria, viruses, and funguses grow, and that leads to infection. Follow these instructions at home: Medicines  Take, use, or apply over-the-counter and prescription medicines only as told by your doctor. These may include nasal sprays.  If you were prescribed an antibiotic medicine, take it as told by your doctor. Do not stop taking the antibiotic even if you start to feel better. Hydrate and Humidify  Drink enough water to keep your pee (urine) clear or pale yellow.  Use a cool mist humidifier to keep the humidity level in your home above 50%.  Breathe in steam for 10-15 minutes, 3-4 times a day or as told by your doctor. You can do this in the bathroom while a hot shower is running.  Try not to spend time in cool or dry air. Rest  Rest as much as possible.  Sleep with your head raised (elevated).  Make sure to get enough sleep each night. General instructions  Put a warm, moist washcloth on your face 3-4 times a day or as told by your doctor. This will help  with discomfort.  Wash your hands often with soap and water. If there is no soap and water, use hand sanitizer.  Do not smoke. Avoid being around people who are smoking (secondhand smoke).  Keep all follow-up visits as told by your doctor. This is important. Contact a doctor if:  You have a fever.  Your symptoms get worse.  Your symptoms do not get better within 10 days. Get help right away if:  You have a very bad headache.  You cannot stop throwing up (vomiting).  You have pain or swelling around your face or eyes.  You have trouble seeing.  You feel confused.  Your neck is stiff.  You have trouble breathing. This information is not intended to replace advice given to you by your health care provider. Make sure you discuss any questions you have with your health care provider. Document Released: 05/04/2008 Document Revised: 07/12/2016 Document Reviewed: 09/11/2015 Elsevier Interactive Patient Education  [employer].

## 2018-11-09 ENCOUNTER — Other Ambulatory Visit: Payer: Self-pay | Admitting: Family Medicine

## 2018-11-09 ENCOUNTER — Other Ambulatory Visit: Payer: Self-pay

## 2018-11-09 DIAGNOSIS — F411 Generalized anxiety disorder: Secondary | ICD-10-CM

## 2018-11-09 MED ORDER — BUSPIRONE HCL 30 MG PO TABS: ORAL_TABLET | ORAL | 0 refills | Status: DC

## 2018-11-10 ENCOUNTER — Encounter: Payer: Self-pay | Admitting: Obstetrics and Gynecology

## 2018-11-10 ENCOUNTER — Ambulatory Visit: Payer: Self-pay | Admitting: Obstetrics and Gynecology

## 2018-11-10 NOTE — Progress Notes (Deleted)
37 y.o. G75P0010 Single Caucasian female here for annual exam.    PCP:     No LMP recorded. Patient has had a hysterectomy.           Sexually active: {yes no:314532}  The current method of family planning is status post hysterectomy.    Exercising: {yes no:314532}  {types:19826} Smoker:  {YES NO:22349}  Health Maintenance: Pap:  05-17-17 Neg:Pos HR HPV History of abnormal Pap:  Yes, 01-21-16 VAIN II, 09-18-15 LSIL:Pos HR HPV;colpo/vulvar bx 10-11-15--VAIN II  MMG:  n/a Colonoscopy:  *** BMD:   ***  Result  *** TDaP:  *** Gardasil:   {YES NO:22349} HIV:05-17-17 NR Hep C:05-17-17 Neg Screening Labs:  Hb today: ***, Urine today: ***   reports that she quit smoking about a year ago. Her smoking use included cigarettes. She has a 6.00 pack-year smoking history. She has never used smokeless tobacco. She reports that she does not drink alcohol or use drugs.  Past Medical History:  Diagnosis Date  . Abnormal Pap smear of cervix    --recurrent ascus w/Pos. HR HPV  . ADD (attention deficit disorder)   . Alcohol abuse, in remission 2012  . Allergy   . Anxiety   . Aortic stenosis due to bicuspid aortic valve 02/20/2014  . Arthritis    In hips  . Asthma    rare inhaler use  . Bipolar disorder ([place])   . Headache   . Hyperlipidemia with target LDL less than 130 01/27/2013  . Hypertension   . Muscle spasms of both lower extremities    both hips  . 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 Tobacco abuse 09/27/2015  . 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 ABDOMINAL HYSTERECTOMY  02/06/2009   Robotic total laparoscopic hysterectomy  . ANTERIOR CRUCIATE LIGAMENT REPAIR  1993  . AORTIC VALVE REPLACEMENT N/A 11/03/2017   Procedure:  MINIMALLY INVASIVE AORTIC VALVE REPLACEMENT( MINI THORACOTOMY);  Surgeon: Rexene Alberts, MD;  Location: [facility];  Service: Open Heart Surgery;  Laterality: N/A;  . CARDIAC CATHETERIZATION  June 2015   no CAD - moderate AS noted  . 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 COLPOSCOPY  10/2008   CIN I & II  . COLPOSCOPY  07/2000   Neg. ECC  . COLPOSCOPY  06/2001   CIN I  . COLPOSCOPY  08/2004   ECC--atypia  . COLPOSCOPY N/A 01/21/2016   Procedure: COLPOSCOPY with vaginal biopsy with CO 2 Laser of Vaginal and vulvar condyloma;  Surgeon: [name], MD;  Location: [facility];  Service: Gynecology;  Laterality: N/A;  [name] will be here 2/21 for 1115 case confirmed 01/16/15 - TS  . HERNIA REPAIR  1981/1982  . HIP SURGERY  1981   Hip Reset   . HIP SURGERY  1990   Plate was reconstructed/ took out growth plate in Right knee  . HIP SURGERY  1992   Plate removed in Left hip  . LEFT AND RIGHT HEART CATHETERIZATION WITH CORONARY ANGIOGRAM N/A 05/18/2014   Procedure: LEFT AND RIGHT HEART CATHETERIZATION WITH CORONARY ANGIOGRAM;  Surgeon: Jettie Booze, MD;  Location: [facility] C[facility] LAB;  Service: Cardiovascular;  Laterality:  N/A;  . LYMPH NODE BIOPSY  1995  . NASAL SEPTUM SURGERY  2002  . TEE WITHOUT CARDIOVERSION N/A 11/03/2017   Procedure: TRANSESOPHAGEAL ECHOCARDIOGRAM (TEE);  Surgeon: Rexene Alberts, MD;  Location: [facility];  Service: Open Heart Surgery;  Laterality: N/A;  . TONSILLECTOMY AND ADENOIDECTOMY  1990  . TYMPANOSTOMY TUBE PLACEMENT  1981/1982    Current Outpatient Medications  Medication Sig Dispense Refill  . albuterol (PROVENTIL HFA;VENTOLIN HFA) 108 (90 Base) MCG/ACT inhaler Inhale 2 puffs into the lungs every 6 (six) hours as needed for wheezing or shortness of breath. 1 Inhaler 3  . amoxicillin-clavulanate (AUGMENTIN) 875-125 MG tablet Take 1 tablet by mouth 2 (two) times daily. 20 tablet 0  . aspirin EC 81 MG tablet Take 1  tablet (81 mg total) by mouth daily. 90 tablet 3  . busPIRone (BUSPAR) 30 MG tablet Take one tablet by mouth twice daily. 60 tablet 0  . DULoxetine (CYMBALTA) 20 MG capsule TAKE ONE CAPSULE BY MOUTH DAILY (Patient taking differently: Take 20 mg by mouth daily. ) 90 capsule 1  . gabapentin (NEURONTIN) 300 MG capsule TAKE 2 CAPSULES BY MOUTH THREE TIMES A DAY (Patient taking differently: Take 600 mg by mouth 3 (three) times daily. ) 360 capsule 1  . lamoTRIgine (LAMICTAL) 200 MG tablet TAKE 2 TABLETS (400 MG TOTAL) BY MOUTH DAILY. (Patient taking differently: Take 400 mg by mouth daily. ) 180 tablet 1  . lansoprazole (PREVACID) 15 MG capsule Take 15 mg by mouth daily as needed (for heartburn or acid reflux).     . metoprolol tartrate (LOPRESSOR) 25 MG tablet Take 0.5 tablets (12.5 mg total) by mouth 2 (two) times daily. (Patient taking differently: Take 12.5 mg by mouth daily. ) 90 tablet 3  . traMADol (ULTRAM) 50 MG tablet TAKE TWO TABLETS BY MOUTH TWICE A DAY (Patient taking differently: Take 100 mg by mouth 2 (two) times daily. ) 120 tablet 2  . warfarin (COUMADIN) 7.5 MG tablet Take as directed by [facility] 40 tablet 1  . warfarin (COUMADIN) 7.5 MG tablet Take 1 tab daily except 1.5 tabs on Sun, Tues, Thurs or take as directed by [facility]. 40 tablet 3   No current facility-administered medications for this visit.     Family History  Problem Relation Age of Onset  . Hyperlipidemia Mother   . Hypertension Mother   . Thyroid disease Mother   . Hyperlipidemia Father   . Hypertension Father   . Migraines Father   . Hypertension Brother   . Hyperlipidemia Brother   . Thyroid disease Maternal Grandmother   . Thyroid disease Maternal Grandfather   . Cancer Neg Hx     Review of Systems  Exam:   There were no vitals taken for this visit.    General appearance: alert, cooperative and appears stated age Head: Normocephalic, without obvious abnormality, atraumatic Neck: no  adenopathy, supple, symmetrical, trachea midline and thyroid normal to inspection and palpation Lungs: clear to auscultation bilaterally Breasts: normal appearance, no masses or tenderness, No nipple retraction or dimpling, No nipple discharge or bleeding, No axillary or supraclavicular adenopathy Heart: regular rate and rhythm Abdomen: soft, non-tender; no masses, no organomegaly Extremities: extremities normal, atraumatic, no cyanosis or edema Skin: Skin color, texture, turgor normal. No rashes or lesions Lymph nodes: Cervical, supraclavicular, and axillary nodes normal. No abnormal inguinal nodes palpated Neurologic: Grossly normal  Pelvic: External genitalia:  no lesions  Urethra:  normal appearing urethra with no masses, tenderness or lesions              Bartholins and Skenes: normal                 Vagina: normal appearing vagina with normal color and discharge, no lesions              Cervix: no lesions              Pap taken: {yes no:314532} Bimanual Exam:  Uterus:  normal size, contour, position, consistency, mobility, non-tender              Adnexa: no mass, fullness, tenderness              Rectal exam: {yes no:314532}.  Confirms.              Anus:  normal sphincter tone, no lesions  Chaperone was present for exam.  Assessment:   Well woman visit with normal exam.   Plan: Mammogram screening. Recommended self breast awareness. Pap and HR HPV as above. Guidelines for Calcium, Vitamin D, regular exercise program including cardiovascular and weight bearing exercise.   Follow up annually and prn.   Additional counseling given.  {yes [id number]. _______ minutes face to face time of which over 50% was spent in counseling.    After visit summary provided.

## 2018-11-11 ENCOUNTER — Telehealth: Payer: Self-pay | Admitting: Obstetrics and Gynecology

## 2018-11-11 NOTE — Telephone Encounter (Signed)
Please contact patient to schedule her annual exam. She failed her appointment this week.  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

## 2018-11-11 NOTE — Telephone Encounter (Signed)
Call placed to home and mobile numbers. Left messages to call Sharee Pimple, RN at [facility].

## 2018-11-14 ENCOUNTER — Encounter: Payer: Managed Care, Other (non HMO) | Admitting: Thoracic Surgery (Cardiothoracic Vascular Surgery)

## 2018-11-15 ENCOUNTER — Encounter: Payer: Self-pay | Admitting: Thoracic Surgery (Cardiothoracic Vascular Surgery)

## 2018-11-17 ENCOUNTER — Ambulatory Visit (INDEPENDENT_AMBULATORY_CARE_PROVIDER_SITE_OTHER): Payer: Managed Care, Other (non HMO) | Admitting: Pharmacist

## 2018-11-17 DIAGNOSIS — Z7901 Long term (current) use of anticoagulants: Secondary | ICD-10-CM

## 2018-11-17 DIAGNOSIS — Z954 Presence of other heart-valve replacement: Secondary | ICD-10-CM

## 2018-11-17 DIAGNOSIS — Q230 Congenital stenosis of aortic valve: Secondary | ICD-10-CM | POA: Diagnosis not present

## 2018-11-17 DIAGNOSIS — Q231 Congenital insufficiency of aortic valve: Secondary | ICD-10-CM

## 2018-11-17 LAB — POCT INR: INR: 2.9 (ref 2.0–3.0)

## 2018-11-17 NOTE — Telephone Encounter (Signed)
Left detailed message on mobile number, ok per dpr. Advised to return call to office at [phone] to reschedule AEX.

## 2018-11-17 NOTE — Patient Instructions (Signed)
Description   Continue same dosage 1 tablet daily except 1.5 tablets on Sundays, Tuesdays and Thursdays.  Recheck in 6 weeks.  [facility] [phone] Main [phone]

## 2018-11-25 ENCOUNTER — Other Ambulatory Visit: Payer: Self-pay | Admitting: Family Medicine

## 2018-11-29 NOTE — Telephone Encounter (Signed)
Call placed to home and mobile numbers. Left detailed message on mobile number, ok per dpr, requesting return call to office to reschedule AEX.   Alternative contact on dpr is patients mom, number is the same as mobile number.

## 2018-12-07 NOTE — Telephone Encounter (Signed)
Attempted to contact patient x3, left detailed message, number on file is the same for alternative contact on dpr. No return call.   Routing to Dr. Quincy Simmonds to advise.

## 2018-12-07 NOTE — Telephone Encounter (Signed)
Please send a letter recommending she schedule an annual exam appointment due to her history of dysplasia, and then ok to close the pap recall.

## 2018-12-09 ENCOUNTER — Ambulatory Visit: Payer: Managed Care, Other (non HMO) | Admitting: Family

## 2018-12-09 ENCOUNTER — Encounter: Payer: Self-pay | Admitting: Family

## 2018-12-09 ENCOUNTER — Observation Stay (HOSPITAL_COMMUNITY)
Admission: EM | Admit: 2018-12-09 | Discharge: 2018-12-11 | Disposition: A | Discharge status: Home / Self Care | Payer: Managed Care, Other (non HMO) | Attending: Internal Medicine | Admitting: Internal Medicine

## 2018-12-09 ENCOUNTER — Telehealth: Payer: Self-pay | Admitting: Cardiology

## 2018-12-09 ENCOUNTER — Emergency Department (HOSPITAL_COMMUNITY): Payer: Managed Care, Other (non HMO)

## 2018-12-09 VITALS — BP 112/78 | HR 90 | Temp 98.0°F | Ht 60.0 in | Wt 118.1 lb

## 2018-12-09 DIAGNOSIS — Z79899 Other long term (current) drug therapy: Secondary | ICD-10-CM | POA: Diagnosis not present

## 2018-12-09 DIAGNOSIS — E785 Hyperlipidemia, unspecified: Secondary | ICD-10-CM | POA: Diagnosis not present

## 2018-12-09 DIAGNOSIS — Z79891 Long term (current) use of opiate analgesic: Secondary | ICD-10-CM | POA: Insufficient documentation

## 2018-12-09 DIAGNOSIS — M7989 Other specified soft tissue disorders: Secondary | ICD-10-CM | POA: Diagnosis not present

## 2018-12-09 DIAGNOSIS — Z87891 Personal history of nicotine dependence: Secondary | ICD-10-CM | POA: Diagnosis not present

## 2018-12-09 DIAGNOSIS — R52 Pain, unspecified: Secondary | ICD-10-CM

## 2018-12-09 DIAGNOSIS — R220 Localized swelling, mass and lump, head: Principal | ICD-10-CM | POA: Insufficient documentation

## 2018-12-09 DIAGNOSIS — F3177 Bipolar disorder, in partial remission, most recent episode mixed: Secondary | ICD-10-CM | POA: Diagnosis not present

## 2018-12-09 DIAGNOSIS — E876 Hypokalemia: Secondary | ICD-10-CM | POA: Diagnosis not present

## 2018-12-09 DIAGNOSIS — R200 Anesthesia of skin: Secondary | ICD-10-CM | POA: Diagnosis not present

## 2018-12-09 DIAGNOSIS — I10 Essential (primary) hypertension: Secondary | ICD-10-CM | POA: Insufficient documentation

## 2018-12-09 DIAGNOSIS — Z7982 Long term (current) use of aspirin: Secondary | ICD-10-CM | POA: Diagnosis not present

## 2018-12-09 DIAGNOSIS — Z952 Presence of prosthetic heart valve: Secondary | ICD-10-CM | POA: Insufficient documentation

## 2018-12-09 DIAGNOSIS — L539 Erythematous condition, unspecified: Secondary | ICD-10-CM

## 2018-12-09 DIAGNOSIS — Q231 Congenital insufficiency of aortic valve: Secondary | ICD-10-CM

## 2018-12-09 DIAGNOSIS — F319 Bipolar disorder, unspecified: Secondary | ICD-10-CM | POA: Insufficient documentation

## 2018-12-09 DIAGNOSIS — S0083XA Contusion of other part of head, initial encounter: Secondary | ICD-10-CM

## 2018-12-09 DIAGNOSIS — I350 Nonrheumatic aortic (valve) stenosis: Secondary | ICD-10-CM | POA: Diagnosis not present

## 2018-12-09 DIAGNOSIS — Z7901 Long term (current) use of anticoagulants: Secondary | ICD-10-CM

## 2018-12-09 DIAGNOSIS — J4530 Mild persistent asthma, uncomplicated: Secondary | ICD-10-CM | POA: Insufficient documentation

## 2018-12-09 DIAGNOSIS — Z954 Presence of other heart-valve replacement: Secondary | ICD-10-CM

## 2018-12-09 DIAGNOSIS — Q230 Congenital stenosis of aortic valve: Secondary | ICD-10-CM | POA: Diagnosis not present

## 2018-12-09 DIAGNOSIS — R791 Abnormal coagulation profile: Secondary | ICD-10-CM | POA: Insufficient documentation

## 2018-12-09 DIAGNOSIS — S40021A Contusion of right upper arm, initial encounter: Secondary | ICD-10-CM

## 2018-12-09 LAB — CBC WITH DIFFERENTIAL/PLATELET
Abs Immature Granulocytes: 0.02 10*3/uL (ref 0.00–0.07)
BASOS ABS: 0 10*3/uL (ref 0.0–0.1)
Basophils Relative: 1 %
EOS ABS: 0.1 10*3/uL (ref 0.0–0.5)
EOS PCT: 2 %
HEMATOCRIT: 37 % (ref 36.0–46.0)
Hemoglobin: 12.1 g/dL (ref 12.0–15.0)
Immature Granulocytes: 0 %
LYMPHS ABS: 2 10*3/uL (ref 0.7–4.0)
Lymphocytes Relative: 37 %
MCH: 29.4 pg (ref 26.0–34.0)
MCHC: 32.7 g/dL (ref 30.0–36.0)
MCV: 90 fL (ref 80.0–100.0)
MONOS PCT: 10 %
Monocytes Absolute: 0.5 10*3/uL (ref 0.1–1.0)
NRBC: 0 % (ref 0.0–0.2)
Neutro Abs: 2.7 10*3/uL (ref 1.7–7.7)
Neutrophils Relative %: 50 %
Platelets: 165 10*3/uL (ref 150–400)
RBC: 4.11 MIL/uL (ref 3.87–5.11)
RDW: 12.4 % (ref 11.5–15.5)
WBC: 5.4 10*3/uL (ref 4.0–10.5)

## 2018-12-09 LAB — BASIC METABOLIC PANEL
Anion gap: 6 (ref 5–15)
BUN: 12 mg/dL (ref 6–20)
CALCIUM: 8.6 mg/dL — AB (ref 8.9–10.3)
CO2: 26 mmol/L (ref 22–32)
CREATININE: 0.78 mg/dL (ref 0.44–1.00)
Chloride: 106 mmol/L (ref 98–111)
GFR calc Af Amer: >60 mL/min (ref >60)
GFR calc non Af Amer: >60 mL/min (ref >60)
GLUCOSE: 102 mg/dL — AB (ref 70–99)
Potassium: 3.3 mmol/L — ABNORMAL LOW (ref 3.5–5.1)
Sodium: 138 mmol/L (ref 135–145)

## 2018-12-09 LAB — PROTIME-INR
INR: 6.01
Prothrombin Time: 52.6 seconds — ABNORMAL HIGH (ref 11.4–15.2)

## 2018-12-09 MED ORDER — GABAPENTIN 300 MG PO CAPS
600.0000 mg | ORAL_CAPSULE | Freq: Three times a day (TID) | ORAL | Status: DC
  Administered 2018-12-10 – 2018-12-11 (×5): 600 mg via ORAL
  Filled 2018-12-09 (×5): qty 2

## 2018-12-09 MED ORDER — IOHEXOL 300 MG/ML  SOLN
75.0000 mL | Freq: Once | INTRAMUSCULAR | Status: AC | PRN
  Administered 2018-12-09: 75 mL via INTRAVENOUS

## 2018-12-09 MED ORDER — MORPHINE SULFATE (PF) 2 MG/ML IV SOLN
2.0000 mg | INTRAVENOUS | Status: DC | PRN
  Administered 2018-12-10: 2 mg via INTRAVENOUS
  Filled 2018-12-09: qty 1

## 2018-12-09 MED ORDER — DULOXETINE HCL 20 MG PO CPEP
20.0000 mg | ORAL_CAPSULE | Freq: Every day | ORAL | Status: DC
  Administered 2018-12-10 – 2018-12-11 (×2): 20 mg via ORAL
  Filled 2018-12-09 (×2): qty 1

## 2018-12-09 MED ORDER — MORPHINE SULFATE (PF) 4 MG/ML IV SOLN
4.0000 mg | Freq: Once | INTRAVENOUS | Status: AC
  Administered 2018-12-09: 4 mg via INTRAVENOUS
  Filled 2018-12-09: qty 1

## 2018-12-09 MED ORDER — SENNOSIDES-DOCUSATE SODIUM 8.6-50 MG PO TABS: 1.0000 | ORAL_TABLET | Freq: Every evening | ORAL | Status: DC | PRN

## 2018-12-09 MED ORDER — METOPROLOL TARTRATE 12.5 MG HALF TABLET
12.5000 mg | ORAL_TABLET | Freq: Every day | ORAL | Status: DC
  Administered 2018-12-10 – 2018-12-11 (×2): 12.5 mg via ORAL
  Filled 2018-12-09: qty 1

## 2018-12-09 MED ORDER — LAMOTRIGINE 150 MG PO TABS
400.0000 mg | ORAL_TABLET | Freq: Every day | ORAL | Status: DC
  Administered 2018-12-10 – 2018-12-11 (×2): 400 mg via ORAL
  Filled 2018-12-09 (×2): qty 1

## 2018-12-09 MED ORDER — FENTANYL CITRATE (PF) 100 MCG/2ML IJ SOLN
50.0000 ug | Freq: Once | INTRAMUSCULAR | Status: AC
  Administered 2018-12-09: 50 ug via INTRAVENOUS
  Filled 2018-12-09: qty 2

## 2018-12-09 MED ORDER — ONDANSETRON HCL 4 MG/2ML IJ SOLN: 4.0000 mg | Freq: Four times a day (QID) | INTRAMUSCULAR | Status: DC | PRN

## 2018-12-09 MED ORDER — POTASSIUM CHLORIDE 20 MEQ/15ML (10%) PO SOLN
20.0000 meq | Freq: Once | ORAL | Status: AC
  Administered 2018-12-09: 20 meq via ORAL
  Filled 2018-12-09: qty 15

## 2018-12-09 MED ORDER — BUSPIRONE HCL 10 MG PO TABS
30.0000 mg | ORAL_TABLET | Freq: Two times a day (BID) | ORAL | Status: DC
  Administered 2018-12-10 – 2018-12-11 (×3): 30 mg via ORAL
  Filled 2018-12-09 (×3): qty 3

## 2018-12-09 MED ORDER — PANTOPRAZOLE SODIUM 20 MG PO TBEC
20.0000 mg | DELAYED_RELEASE_TABLET | Freq: Every day | ORAL | Status: DC
  Administered 2018-12-10 – 2018-12-11 (×2): 20 mg via ORAL
  Filled 2018-12-09 (×2): qty 1

## 2018-12-09 MED ORDER — VITAMIN K1 10 MG/ML IJ SOLN
2.5000 mg | INTRAVENOUS | Status: AC
  Administered 2018-12-09: 2.5 mg via INTRAVENOUS
  Filled 2018-12-09: qty 0.25

## 2018-12-09 MED ORDER — NICOTINE 21 MG/24HR TD PT24
21.0000 mg | MEDICATED_PATCH | Freq: Every day | TRANSDERMAL | Status: DC
  Administered 2018-12-10: 21 mg via TRANSDERMAL
  Filled 2018-12-09: qty 1

## 2018-12-09 MED ORDER — DEXAMETHASONE SODIUM PHOSPHATE 10 MG/ML IJ SOLN
10.0000 mg | Freq: Three times a day (TID) | INTRAMUSCULAR | Status: AC
  Administered 2018-12-09 – 2018-12-10 (×3): 10 mg via INTRAVENOUS
  Filled 2018-12-09 (×3): qty 1

## 2018-12-09 MED ORDER — ACETAMINOPHEN 650 MG RE SUPP: 650.0000 mg | Freq: Four times a day (QID) | RECTAL | Status: DC | PRN

## 2018-12-09 MED ORDER — ACETAMINOPHEN 325 MG PO TABS: 650.0000 mg | ORAL_TABLET | Freq: Four times a day (QID) | ORAL | Status: DC | PRN

## 2018-12-09 MED ORDER — OXYCODONE-ACETAMINOPHEN 5-325 MG PO TABS
1.0000 | ORAL_TABLET | ORAL | Status: DC | PRN
  Administered 2018-12-10 – 2018-12-11 (×8): 1 via ORAL
  Filled 2018-12-09 (×8): qty 1

## 2018-12-09 MED ORDER — ONDANSETRON HCL 4 MG PO TABS: 4.0000 mg | ORAL_TABLET | Freq: Four times a day (QID) | ORAL | Status: DC | PRN

## 2018-12-09 MED ORDER — ALBUTEROL SULFATE (2.5 MG/3ML) 0.083% IN NEBU: 2.5000 mg | INHALATION_SOLUTION | RESPIRATORY_TRACT | Status: DC | PRN

## 2018-12-09 NOTE — ED Provider Notes (Signed)
[facility] EMERGENCY DEPARTMENT Provider Note   CSN: [id number] Arrival date & time: 12/09/18  1716     History   Chief Complaint Chief Complaint  Patient presents with  . Facial Swelling    HPI Danielle Obrien is a 38 y.o. female with a PMHx of aortic stenosis d/t bicuspid aortic valve s/p replacement, asthma, headaches, HTN, HLD, and other conditions listed below, who presents to the ED with complaints of left facial swelling and pain that began yesterday and progressed today.  She denies any injury or trauma to the face, denies any dental issues or pain, states that she grinds her teeth at night.  She woke up yesterday with facial swelling that worsened today so she came here for evaluation.  She describes the pain as 9/10 constant tightness in the left face/jaw area, nonradiating, worse with opening her mouth, and unrelieved with tramadol.  She reports associated left facial swelling and trismus.  She also mentions that she has a right arm bruise from when she hit herself on something at work 2 days ago, but the bruising and swelling has progressed today and it feels sore.  Lastly she mentions that her left knee felt more swollen, but she denies any injury or trauma to the knee.  She denies any recent changes in medications, soaps or detergents, lotions, creams, animal or plant contact, or any other reason for facial swelling.  She denies having any problems with her teeth at this time.  She denies any fevers, chills, drooling, wheezing, chest pain, shortness of breath, abdominal pain, nausea, vomiting, diarrhea, constipation, dysuria, hematuria, numbness, tingling, focal weakness, or any other complaints at this time.  Her PCP is Dr. Ronnald Ramp at [facility]. She has been taking her coumadin as directed.   The history is provided by the patient and medical records. No language interpreter was used.    Past Medical History:  Diagnosis Date  . Abnormal Pap smear of cervix    --recurrent ascus w/Pos. HR HPV  . ADD (attention deficit disorder)   . Alcohol abuse, in remission 2012  . Allergy   . Anxiety   . Aortic stenosis due to bicuspid aortic valve 02/20/2014  . Arthritis    In hips  . Asthma    rare inhaler use  . Bipolar disorder ([place])   . Headache   . Hyperlipidemia with target LDL less than 130 01/27/2013  . Hypertension   . Muscle spasms of both lower extremities    both hips  . 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 Tobacco abuse 09/27/2015  . 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 RTI (respiratory tract infection) 02/01/2018  . Long term (current) use of anticoagulants [Z79.01] 11/11/2017  . 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 Chronic, continuous use of opioids 09/06/2017  . Tobacco abuse 09/27/2015  . Acute non-recurrent maxillary sinusitis 08/20/2015  . SI (sacroiliac) joint dysfunction 02/20/2015  . Leg length discrepancy 02/20/2015  . Allergic rhinitis, cause unspecified 05/04/2014  . Aortic stenosis due to bicuspid aortic valve 02/20/2014  . Bipolar disorder ([place]) 02/05/2014  . Migraine with status migrainosus 01/27/2013  . Hyperlipidemia with target LDL less than 130 01/27/2013  .  Mild persistent asthma 04/21/2011  . Essential hypertension, benign 04/21/2011  . Cough 04/21/2011    Past Surgical History:  Procedure Laterality Date  . ABDOMINAL HYSTERECTOMY  02/06/2009   Robotic total laparoscopic hysterectomy  . ANTERIOR CRUCIATE LIGAMENT REPAIR  1993  . AORTIC VALVE REPLACEMENT N/A 11/03/2017   Procedure: MINIMALLY INVASIVE AORTIC VALVE REPLACEMENT( MINI THORACOTOMY);  Surgeon: Rexene Alberts, MD;  Location:  [facility];  Service: Open Heart Surgery;  Laterality: N/A;  . CARDIAC CATHETERIZATION  June 2015   no CAD - moderate AS noted  . 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 COLPOSCOPY  10/2008   CIN I & II  . COLPOSCOPY  07/2000   Neg. ECC  . COLPOSCOPY  06/2001   CIN I  . COLPOSCOPY  08/2004   ECC--atypia  . COLPOSCOPY N/A 01/21/2016   Procedure: COLPOSCOPY with vaginal biopsy with CO 2 Laser of Vaginal and vulvar condyloma;  Surgeon: [name], MD;  Location: [facility];  Service: Gynecology;  Laterality: N/A;  [name] will be here 2/21 for 1115 case confirmed 01/16/15 - TS  . HERNIA REPAIR  1981/1982  . HIP SURGERY  1981   Hip Reset   . HIP SURGERY  1990   Plate was reconstructed/ took out growth plate in Right knee  . HIP SURGERY  1992   Plate removed in Left hip  . LEFT AND RIGHT HEART CATHETERIZATION WITH CORONARY ANGIOGRAM N/A 05/18/2014   Procedure: LEFT AND RIGHT HEART CATHETERIZATION WITH CORONARY ANGIOGRAM;  Surgeon: Jettie Booze, MD;  Location: [facility] CATH LAB;  Service: Cardiovascular;  Laterality: N/A;  . LYMPH NODE BIOPSY  1995  . NASAL SEPTUM SURGERY  2002  . TEE WITHOUT CARDIOVERSION N/A 11/03/2017   Procedure: TRANSESOPHAGEAL ECHOCARDIOGRAM (TEE);  Surgeon: Rexene Alberts, MD;  Location: [facility];  Service: Open Heart Surgery;  Laterality: N/A;  . TONSILLECTOMY AND ADENOIDECTOMY  1990  . TYMPANOSTOMY TUBE PLACEMENT  1981/1982     OB History    Gravida  1   Para      Term      Preterm      AB  1   Living  0     SAB      TAB      Ectopic      Multiple      Live Births               Home Medications    Prior to Admission medications   Medication Sig Start Date End Date Taking? Authorizing Provider  albuterol (PROVENTIL HFA;VENTOLIN HFA) 108 (90 Base) MCG/ACT inhaler Inhale 2 puffs into the lungs every 6 (six) hours as needed for wheezing or shortness of breath. 02/08/18   Janith Lima, MD    aspirin EC 81 MG tablet Take 1 tablet (81 mg total) by mouth daily. 01/20/18   Sueanne Margarita, MD  busPIRone (BUSPAR) 30 MG tablet Take one tablet by mouth twice daily. 11/09/18   Lyndal Pulley, DO  DULoxetine (CYMBALTA) 20 MG capsule TAKE ONE CAPSULE BY MOUTH DAILY Patient taking differently: Take 20 mg by mouth daily.  06/23/18   Lyndal Pulley, DO  gabapentin (NEURONTIN) 300 MG capsule TAKE 2 CAPSULES BY MOUTH THREE TIMES A DAY Patient taking differently: Take 600 mg by mouth 3 (three) times daily.  08/17/18   Lyndal Pulley, DO  lamoTRIgine (LAMICTAL) 200  MG tablet TAKE 2 TABLETS (400 MG TOTAL) BY MOUTH DAILY. Patient taking differently: Take 400 mg by mouth daily.  07/20/18   Janith Lima, MD  lansoprazole (PREVACID) 15 MG capsule Take 15 mg by mouth daily as needed (for heartburn or acid reflux).     [provider]  metoprolol tartrate (LOPRESSOR) 25 MG tablet Take 0.5 tablets (12.5 mg total) by mouth 2 (two) times daily. Patient taking differently: Take 12.5 mg by mouth daily.  05/10/18   Sueanne Margarita, MD  traMADol (ULTRAM) 50 MG tablet TAKE TWO TABLETS BY MOUTH TWICE A DAY 11/29/18   Lyndal Pulley, DO  warfarin (COUMADIN) 7.5 MG tablet Take as directed by [facility] 04/08/18   Sueanne Margarita, MD  warfarin (COUMADIN) 7.5 MG tablet Take 1 tab daily except 1.5 tabs on Sun, Tues, Thurs or take as directed by [facility]. 10/03/18   Sueanne Margarita, MD    Family History Family History  Problem Relation Age of Onset  . Hyperlipidemia Mother   . Hypertension Mother   . Thyroid disease Mother   . Hyperlipidemia Father   . Hypertension Father   . Migraines Father   . Hypertension Brother   . Hyperlipidemia Brother   . Thyroid disease Maternal Grandmother   . Thyroid disease Maternal Grandfather   . Cancer Neg Hx     Social History Social History   Tobacco Use  . Smoking status: Former Smoker    Packs/day: 0.50    Years: 12.00    Pack years: 6.00     Types: Cigarettes    Last attempt to quit: 11/02/2017    Years since quitting: 1.1  . Smokeless tobacco: Never Used  . Tobacco comment: pt given fake cigarette for hand/mouth association. pt using [facility] for support  Substance Use Topics  . Alcohol use: No    Alcohol/week: 0.0 standard drinks  . Drug use: No     Allergies   Demerol; Ibuprofen; and Codeine   Review of Systems Review of Systems  Constitutional: Negative for chills and fever.  HENT: Positive for facial swelling. Negative for dental problem, drooling and trouble swallowing.   Respiratory: Negative for shortness of breath and wheezing.   Cardiovascular: Negative for chest pain.  Gastrointestinal: Negative for abdominal pain, constipation, diarrhea, nausea and vomiting.  Genitourinary: Negative for dysuria and hematuria.  Musculoskeletal: Positive for arthralgias and joint swelling. Negative for myalgias.  Skin: Positive for color change.  Allergic/Immunologic: Negative for immunocompromised state.  Neurological: Negative for weakness and numbness.  Hematological: Bruises/bleeds easily (on coumadin).  Psychiatric/Behavioral: Negative for confusion.   All other systems reviewed and are negative for acute change except as noted in the HPI.    Physical Exam Updated Vital Signs BP (!) 147/84   Pulse 82   Temp 98.3 F (36.8 C)   Resp 18   SpO2 98%   Physical Exam Vitals signs and nursing note reviewed.  Constitutional:      General: She is not in acute distress.    Appearance: Normal appearance. She is well-developed. She is not toxic-appearing.     Comments: Afebrile, nontoxic, NAD  HENT:     Head: Normocephalic and atraumatic.     Mouth/Throat:     Mouth: Mucous membranes are moist.     Dentition: No dental caries or dental abscesses.     Pharynx: Oropharynx is clear. Uvula midline. No pharyngeal swelling, oropharyngeal exudate, posterior oropharyngeal erythema or uvula swelling.  Tonsils: No tonsillar exudate or tonsillar abscesses.     Comments: L jaw/face with swelling and faint bruising to lower jawline, extending to below the jaw into the parapharyngeal space and up near the TMJ, with mild TTP to the TMJ and along this area, some mild trismus only able to open mouth 2 finger breadths wide; no drooling. No erythema or warmth of the skin of the face/jaw. No dentitia abnormalities and no dental abscess noted. Oropharynx clear, tonsils unremarkable.  Eyes:     General:        Right eye: No discharge.        Left eye: No discharge.     Conjunctiva/sclera: Conjunctivae normal.  Neck:     Musculoskeletal: Normal range of motion and neck supple.  Cardiovascular:     Rate and Rhythm: Normal rate and regular rhythm.     Pulses: Normal pulses.     Heart sounds: S1 normal and S2 normal. Murmur present. No friction rub. No gallop.      Comments: Mechanical heart sounds/murmur Pulmonary:     Effort: Pulmonary effort is normal. No respiratory distress.     Breath sounds: Normal breath sounds. No decreased breath sounds, wheezing, rhonchi or rales.  Abdominal:     General: Bowel sounds are normal. There is no distension.     Palpations: Abdomen is soft. Abdomen is not rigid.     Tenderness: There is no abdominal tenderness. There is no right CVA tenderness, left CVA tenderness, guarding or rebound. Negative signs include Murphy's sign and McBurney's sign.  Musculoskeletal: Normal range of motion.     Right elbow: She exhibits swelling. She exhibits normal range of motion, no deformity and no laceration. No tenderness found.     Comments: R elbow with FROM intact, with mild bruising and swelling up to mid-forearm, no focal joint line or bony TTP, no crepitus or deformity, no warmth or erythema. Strength and sensation grossly intact, distal pulses intact, soft compartments.  No appreciable swelling or tenderness or bruising to L knee.   Skin:    General: Skin is warm and dry.      Findings: No rash.  Neurological:     Mental Status: She is alert and oriented to person, place, and time.     Sensory: Sensation is intact. No sensory deficit.     Motor: Motor function is intact.  Psychiatric:        Mood and Affect: Mood and affect normal.        Behavior: Behavior normal.      ED Treatments / Results  Labs (all labs ordered are listed, but only abnormal results are displayed) Labs Reviewed  BASIC METABOLIC PANEL - Abnormal; Notable for the following components:      Result Value   Potassium 3.3 (*)    Glucose, Bld 102 (*)    Calcium 8.6 (*)    All other components within normal limits  PROTIME-INR - Abnormal; Notable for the following components:   Prothrombin Time 52.6 (*)    INR 6.01 (*)    All other components within normal limits  CBC WITH DIFFERENTIAL/PLATELET    EKG None  Radiology Dg Forearm Right  Result Date: 12/09/2018 CLINICAL DATA:  Initial evaluation for acute right elbow bruising and swelling. EXAM: RIGHT FOREARM - 2 VIEW COMPARISON:  None. FINDINGS: There is no evidence of fracture or other focal bone lesions. Soft tissues demonstrate no acute finding. IV catheter in place. IMPRESSION: No acute osseous abnormality about  the right forearm. Electronically Signed   By: Jeannine Boga M.D.   On: 12/09/2018 18:26   Ct Maxillofacial W Contrast  Result Date: 12/09/2018 CLINICAL DATA:  Initial evaluation for acute left facial swelling and trismus. EXAM: CT MAXILLOFACIAL WITH CONTRAST TECHNIQUE: Multidetector CT imaging of the maxillofacial structures was performed with intravenous contrast. Multiplanar CT image reconstructions were also generated. CONTRAST:  63mL OMNIPAQUE IOHEXOL 300 MG/ML  SOLN COMPARISON:  None. FINDINGS: Osseous: No acute osseous abnormality within the face. No discrete osseous lesions. Orbits: Globes and orbital soft tissues within normal limits. Bony orbits intact. Sinuses: Paranasal sinuses are clear. Visualized mastoid  air cells and middle ear cavities are well pneumatized and free of fluid. Soft tissues: Asymmetric soft tissue swelling with inflammatory stranding involving the left face, primarily involving the left masticator and submandibular spaces, with extension into the left parapharyngeal space. An odontogenic source is suspected, although evaluation of the dentition limited by extensive streak artifact from dental amalgam. No discernible or discrete odontogenic abscess. Inflammatory changes surround the left submandibular gland. Gland itself is relatively normal in appearance without evidence for sialolithiasis or acute solid adenitis. Visualized parotid glands within normal limits. No other acute inflammatory changes within the visualize face. Limited intracranial: Unremarkable. IMPRESSION: Soft tissue swelling with inflammatory stranding involving the left face, primarily involving the left masticator, submandibular, and parapharyngeal spaces. Findings suspicious for acute left facial cellulitis. Changes are most commonly odontogenic in origin, although evaluation of the dentition limited on this exam by extensive streak artifact from dental amalgam. No discrete odontogenic abscess or other collection. Electronically Signed   By: Jeannine Boga M.D.   On: 12/09/2018 20:44    Procedures Procedures (including critical care time)  Medications Ordered in ED Medications  dexamethasone (DECADRON) injection 10 mg (has no administration in time range)  fentaNYL (SUBLIMAZE) injection 50 mcg (50 mcg Intravenous Given 12/09/18 1829)  iohexol (OMNIPAQUE) 300 MG/ML solution 75 mL (75 mLs Intravenous Contrast Given 12/09/18 2005)  morphine 4 MG/ML injection 4 mg (4 mg Intravenous Given 12/09/18 2112)     Initial Impression / Assessment and Plan / ED Course  I have reviewed the triage vital signs and the nursing notes.  Pertinent labs & imaging results that were available during my care of the patient were  reviewed by me and considered in my medical decision making (see chart for details).     38 y.o. female here with L facial swelling x1 day and R elbow bruise/swelling x2 days. States hit her R elbow on something at work, but swelling progressing. No injury to face but started swelling, limiting ability to open mouth. On exam, L lower jaw swelling and faint bruised appearance, no dentitia abnormality, no dental abscess noted, trismus noted only able to open to 2 finger breadths, bruising and swelling to R elbow/forearm but no bony TTP. No appreciable swelling or tenderness to L knee. Will get CT maxillofacial to evaluate source of swelling/extent of hematoma, and get R elbow xray; will get labs including INR. Will reassess shortly. Discussed case with my attending Dr. Wilson Singer who agrees with plan.   8:58 PM CBC w/diff WNL. BMP with marginally low K 3.3 but otherwise unremarkable. INR supratherapeutic at 6.01. R forearm xray negative for acute findings. CT maxillofacial showing soft tissue swelling and inflammatory stranding involving L masticator, submandibular, and parapharyngeal spaces, suspicious for cellulitis of odontogenic origin although no definite abscess or other collection seen on CT; spoke with Dr. Jeannine Boga radiologist who read the CT  and discussed the case further, states that this could be blood rather than infection, they would look similar on CT; clinically suspect that this is blood/hematoma rather than infection. Will consult ENT to discuss plan of care. Likely admit for obs while INR becomes therapeutic, to ensure no airway involvement.   9:24 PM Dr. Blenda Nicely of ENT feels this is likely blood, wants her admitted overnight to reverse her INR/coumadin, elevate head of bed, apply ice, give decadron 10mg  IV q8h x3 doses, and admit to medical service. Will proceed with this plan.   9:39 PM Dr. Blaine Hamper of [facility] returning page and will admit. Holding orders to be placed by admitting team. Please  see their notes for further documentation of care. I appreciate their help with this pleasant pt's care. Pt stable at time of admission.    Final Clinical Impressions(s) / ED Diagnoses   Final diagnoses:  Left facial swelling  Facial hematoma, initial encounter  Supratherapeutic INR  Hematoma of arm, right, initial encounter    ED Discharge Orders    [address], [place], [place] 12/09/18 2141    Virgel Manifold, MD 12/13/18 1758

## 2018-12-09 NOTE — Telephone Encounter (Signed)
New Message    Patient called in about bruising on arms and legs and also stated she had swelling on Left side of face and knees swelling as well.  Got the call over to Triage.

## 2018-12-09 NOTE — Telephone Encounter (Signed)
Call transferred and I spoke with pt, she states she hit her Rt arm at the elbow area 2 days ago on corner of the door she states and now she has a bruise that has spread to her wrist. She denies any swelling of the arm. She states that she has supination and pronation of the elbow and flexion and extended of the wrist. She states that the facial swelling and left knee swelling started yesterday. She denies any itching or redness of the areas. Denies change in meds or diet. Verbalizes that it is hard to chew or open her mouth. Instructed pt to call her PCP and report these findings and if they can't evaluate go to urgent care.

## 2018-12-09 NOTE — ED Triage Notes (Signed)
Pt here from home with c/o facial swelling and bruising to right arm , pt is on coumadin

## 2018-12-09 NOTE — ED Notes (Signed)
Patient transported to CT 

## 2018-12-09 NOTE — H&P (Signed)
History and Physical    Danielle Obrien KTG:[id number] DOB: Mar 04, 1981 DOA: 12/09/2018  Referring MD/NP/PA:   PCP: Janith Lima, MD   Patient coming from:  The patient is coming from home.  At baseline, pt is independent for most of ADL.        Chief Complaint: Left facial swelling and pain   HPI: Danielle Obrien is a 38 y.o. 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  Pt states that she started having left facial swelling yesterday, which has been progressively and gradually getting worse.  She also has a severe pain, which is severe, 10 out of 10 severity, nonradiating.  She has trismus, feels difficult to open her mouth.  She denies any dental pain or dental infection recently.  No any injury.  She also mentions that she has a right arm bruise from when she hit herself on something at work 2 days ago, but the bruising and swelling has progressed today and it feels sore.  She denies any recent changes in medications, soaps or detergents, lotions, creams, animal or plant contact.  Patient does not have difficult breathing, chest pain, cough.  No nausea vomiting, diarrhea, abdominal pain, symptoms of UTI or unilateral weakness.  She states that she quit drinking alcohol 7 and half years ago, she continues to smoke cigarettes a little currently.  ED Course: pt was found to have INR 6.01, WBC 5.1, potassium 3.3, creatinine normal, BUN 12, temperature normal, no tachycardia, no tachypnea, oxygen saturation 97% on room air, chest x-ray negative.  X-ray of right forearm is negative for bony fracture.  Pt is placed on SDU for obs. ENT, Dr. Blenda Nicely was consulted by EDP.  # CT of maxillofacial imaging showed: Soft tissue swelling with inflammatory stranding involving the left face, primarily  involving the left masticator, submandibular, and parapharyngeal spaces.  Review of Systems:   General: no fevers, chills, no body weight gain, has poor appetite. HEENT: no blurry vision, hearing changes or sore throat. Has left facial swelling and pain, and trismus. Respiratory: no dyspnea, coughing, wheezing CV: no chest pain, no palpitations GI: no nausea, vomiting, abdominal pain, diarrhea, constipation GU: no dysuria, burning on urination, increased urinary frequency, hematuria  Ext: no leg edema. Has right forearm swelling. Neuro: no unilateral weakness, numbness, or tingling, no vision change or hearing loss Skin: no rash, no skin tear. MSK: No muscle spasm, no deformity, no limitation of range of movement in spin Heme: No easy bruising.  Travel history: No recent long distant travel.  Allergy:  Allergies  Allergen Reactions  . Demerol Nausea And Vomiting  . Ibuprofen Other (See Comments)    Gastritis   . Codeine Itching      Past Surgical History:  Procedure Laterality Date  . ABDOMINAL HYSTERECTOMY  02/06/2009   Robotic total laparoscopic hysterectomy  . ANTERIOR CRUCIATE LIGAMENT REPAIR  1993  . AORTIC VALVE REPLACEMENT N/A 11/03/2017   Procedure: MINIMALLY INVASIVE AORTIC VALVE REPLACEMENT( MINI THORACOTOMY);  Surgeon: Rexene Alberts, MD;  Location: [place];  Service: Open Heart Surgery;  Laterality: N/A;  . CARDIAC CATHETERIZATION  June 2015   no CAD - moderate AS noted  . 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 COLPOSCOPY  10/2008   CIN I & II  .  COLPOSCOPY  07/2000   Neg. ECC  . COLPOSCOPY  06/2001   CIN I  . COLPOSCOPY  08/2004   ECC--atypia  . COLPOSCOPY N/A 01/21/2016   Procedure: COLPOSCOPY with vaginal biopsy with CO 2 Laser of Vaginal and vulvar condyloma;  Surgeon: [name], MD;  Location: [facility];  Service: Gynecology;  Laterality: N/A;  [name] will be here 2/21 for 1115 case confirmed  01/16/15 - TS  . HERNIA REPAIR  1981/1982  . HIP SURGERY  1981   Hip Reset   . HIP SURGERY  1990   Plate was reconstructed/ took out growth plate in Right knee  . HIP SURGERY  1992   Plate removed in Left hip  . LEFT AND RIGHT HEART CATHETERIZATION WITH CORONARY ANGIOGRAM N/A 05/18/2014   Procedure: LEFT AND RIGHT HEART CATHETERIZATION WITH CORONARY ANGIOGRAM;  Surgeon: Jettie Booze, MD;  Location: [facility] CATH LAB;  Service: Cardiovascular;  Laterality: N/A;  . LYMPH NODE BIOPSY  1995  . NASAL SEPTUM SURGERY  2002  . TEE WITHOUT CARDIOVERSION N/A 11/03/2017   Procedure: TRANSESOPHAGEAL ECHOCARDIOGRAM (TEE);  Surgeon: Rexene Alberts, MD;  Location: [place];  Service: Open Heart Surgery;  Laterality: N/A;  . TONSILLECTOMY AND ADENOIDECTOMY  1990  . TYMPANOSTOMY TUBE PLACEMENT  1981/1982    Social History:  reports that she quit smoking about 13 months ago. Her smoking use included cigarettes. She has a 6.00 pack-year smoking history. She has never used smokeless tobacco. She reports that she does not drink alcohol or use drugs.  Family History:  Family History  Problem Relation Age of Onset  . Hyperlipidemia Mother   . Hypertension Mother   . Thyroid disease Mother   . Hyperlipidemia Father   . Hypertension Father   . Migraines Father   . Hypertension Brother   . Hyperlipidemia Brother   . Thyroid disease Maternal Grandmother   . Thyroid disease Maternal Grandfather   . Cancer Neg Hx      Prior to Admission medications   Medication Sig Start Date End Date Taking? Authorizing Provider  albuterol (PROVENTIL HFA;VENTOLIN HFA) 108 (90 Base) MCG/ACT inhaler Inhale 2 puffs into the lungs every 6 (six) hours as needed for wheezing or shortness of breath. 02/08/18  Yes Janith Lima, MD  aspirin EC 81 MG tablet Take 1 tablet (81 mg total) by mouth daily. 01/20/18  Yes Turner, Eber Hong, MD  busPIRone (BUSPAR) 30 MG tablet Take one tablet by mouth twice daily. 11/09/18  Yes Lyndal Pulley, DO  DULoxetine (CYMBALTA) 20 MG capsule TAKE ONE CAPSULE BY MOUTH DAILY Patient taking differently: Take 20 mg by mouth daily.  06/23/18  Yes Lyndal Pulley, DO  gabapentin (NEURONTIN) 300 MG capsule TAKE 2 CAPSULES BY MOUTH THREE TIMES A DAY Patient taking differently: Take 600 mg by mouth 3 (three) times daily.  08/17/18  Yes Lyndal Pulley, DO  lamoTRIgine (LAMICTAL) 200 MG tablet TAKE 2 TABLETS (400 MG TOTAL) BY MOUTH DAILY. Patient taking differently: Take 400 mg by mouth daily.  07/20/18  Yes Janith Lima, MD  lansoprazole (PREVACID) 15 MG capsule Take 15 mg by mouth daily as needed (for heartburn or acid reflux).    Yes [provider]  metoprolol tartrate (LOPRESSOR) 25 MG tablet Take 0.5 tablets (12.5 mg total) by mouth 2 (two) times daily. Patient taking differently: Take 12.5 mg by mouth daily.  05/10/18  Yes Sueanne Margarita, MD  traMADol Veatrice Bourbon) 50  MG tablet TAKE TWO TABLETS BY MOUTH TWICE A DAY Patient taking differently: Take 100 mg by mouth 2 (two) times daily.  11/29/18  Yes Hulan Saas M, DO  warfarin (COUMADIN) 7.5 MG tablet Take as directed by [facility] Patient taking differently: Take 7.5-11.25 mg by mouth See admin instructions. Sundays, Tuesdays and Thursdays take 11.25mg . Mondays, Wednesdays, Fridays and Saturdays take 7.5mg . 04/08/18  Yes Sueanne Margarita, MD    Physical Exam: Vitals:   12/09/18 2332 12/10/18 0002 12/10/18 0003 12/10/18 0330  BP: 133/89  (!) 145/89 137/81  Pulse: 77     Resp: 17  16 15   Temp:  98.2 F (36.8 C)  97.8 F (36.6 C)  TempSrc:  Oral  Oral  SpO2: 99%  99% 98%  Weight:  54.5 kg    Height:  5' (1.524 m)     General: Not in acute distress HEENT: has swelling and tenderness in left face, with some trismus, able to open mouth 2 finger wide. No erythema or warmth to the skin of the face/jaw. Left upper neck is minimally swelling.       Eyes: PERRL, EOMI, no scleral icterus.       ENT: No discharge from the ears and  nose, no pharynx injection, no tonsillar enlargement.        Neck: No JVD, no bruit, no mass felt. Heme: No neck lymph node enlargement. Cardiac: S1/S2, RRR, No murmurs, No gallops or rubs. Respiratory: No rales, wheezing, rhonchi or rubs. No stridor GI: Soft, nondistended, nontender, no rebound pain, no organomegaly, BS present. GU: No hematuria Ext: No pitting leg edema bilaterally. 2+DP/PT pulse bilaterally. Has right forearm swelling around elbow area, with mild tenderness and erythema, no warmth. Musculoskeletal: No joint deformities, No joint redness or warmth, no limitation of ROM in spin. Skin: No rashes.  Neuro: Alert, oriented X3, cranial nerves II-XII grossly intact, moves all extremities normally. Psych: Patient is not psychotic, no suicidal or hemocidal ideation.  Labs on Admission: I have personally reviewed following labs and imaging studies  CBC: Recent Labs  Lab 12/09/18 1845 12/10/18 0317  WBC 5.4 5.9  NEUTROABS 2.7  --   HGB 12.1 13.2  HCT 37.0 39.7  MCV 90.0 89.6  PLT 165 938   Basic Metabolic Panel: Recent Labs  Lab 12/09/18 1845 12/10/18 0317  NA 138 140  K 3.3* 4.2  CL 106 105  CO2 26 28  GLUCOSE 102* 149*  BUN 12 8  CREATININE 0.78 0.86  CALCIUM 8.6* 9.1  MG  --  2.1   GFR: Estimated Creatinine Clearance: 63.7 mL/min (by C-G formula based on SCr of 0.86 mg/dL). Liver Function Tests: No results for input(s): AST, ALT, ALKPHOS, BILITOT, PROT, ALBUMIN in the last 168 hours. No results for input(s): LIPASE, AMYLASE in the last 168 hours. No results for input(s): AMMONIA in the last 168 hours. Coagulation Profile: Recent Labs  Lab 12/09/18 1845 12/10/18 0317  INR 6.01* 1.68   Cardiac Enzymes: No results for input(s): CKTOTAL, CKMB, CKMBINDEX, TROPONINI in the last 168 hours. BNP (last 3 results) No results for input(s): PROBNP in the last 8760 hours. HbA1C: No results for input(s): HGBA1C in the last 72 hours. CBG: No results for  input(s): GLUCAP in the last 168 hours. Lipid Profile: No results for input(s): CHOL, HDL, LDLCALC, TRIG, CHOLHDL, LDLDIRECT in the last 72 hours. Thyroid Function Tests: No results for input(s): TSH, T4TOTAL, FREET4, T3FREE, THYROIDAB in the last 72 hours. Anemia Panel: No  results for input(s): VITAMINB12, FOLATE, FERRITIN, TIBC, IRON, RETICCTPCT in the last 72 hours. Urine analysis:    Component Value Date/Time   COLORURINE STRAW (A) 11/01/2017 1353   APPEARANCEUR CLEAR 11/01/2017 1353   LABSPEC 1.013 11/01/2017 1353   PHURINE 7.0 11/01/2017 1353   GLUCOSEU NEGATIVE 11/01/2017 1353   GLUCOSEU NEGATIVE 01/07/2015 1025   HGBUR NEGATIVE 11/01/2017 1353   BILIRUBINUR NEGATIVE 11/01/2017 1353   BILIRUBINUR n 09/18/2015 0911   KETONESUR NEGATIVE 11/01/2017 1353   PROTEINUR NEGATIVE 11/01/2017 1353   UROBILINOGEN negative 09/18/2015 0911   UROBILINOGEN 0.2 01/07/2015 1025   NITRITE NEGATIVE 11/01/2017 1353   LEUKOCYTESUR NEGATIVE 11/01/2017 1353   Sepsis Labs: @LABRCNTIP (procalcitonin:4,lacticidven:4) )No results found for this or any previous visit (from the past 240 hour(s)).   Radiological Exams on Admission: Dg Forearm Right  Result Date: 12/09/2018 CLINICAL DATA:  Initial evaluation for acute right elbow bruising and swelling. EXAM: RIGHT FOREARM - 2 VIEW COMPARISON:  None. FINDINGS: There is no evidence of fracture or other focal bone lesions. Soft tissues demonstrate no acute finding. IV catheter in place. IMPRESSION: No acute osseous abnormality about the right forearm. Electronically Signed   By: Jeannine Boga M.D.   On: 12/09/2018 18:26   Ct Maxillofacial W Contrast  Result Date: 12/09/2018 CLINICAL DATA:  Initial evaluation for acute left facial swelling and trismus. EXAM: CT MAXILLOFACIAL WITH CONTRAST TECHNIQUE: Multidetector CT imaging of the maxillofacial structures was performed with intravenous contrast. Multiplanar CT image reconstructions were also generated.  CONTRAST:  38mL OMNIPAQUE IOHEXOL 300 MG/ML  SOLN COMPARISON:  None. FINDINGS: Osseous: No acute osseous abnormality within the face. No discrete osseous lesions. Orbits: Globes and orbital soft tissues within normal limits. Bony orbits intact. Sinuses: Paranasal sinuses are clear. Visualized mastoid air cells and middle ear cavities are well pneumatized and free of fluid. Soft tissues: Asymmetric soft tissue swelling with inflammatory stranding involving the left face, primarily involving the left masticator and submandibular spaces, with extension into the left parapharyngeal space. An odontogenic source is suspected, although evaluation of the dentition limited by extensive streak artifact from dental amalgam. No discernible or discrete odontogenic abscess. Inflammatory changes surround the left submandibular gland. Gland itself is relatively normal in appearance without evidence for sialolithiasis or acute solid adenitis. Visualized parotid glands within normal limits. No other acute inflammatory changes within the visualize face. Limited intracranial: Unremarkable. IMPRESSION: Soft tissue swelling with inflammatory stranding involving the left face, primarily involving the left masticator, submandibular, and parapharyngeal spaces. Findings suspicious for acute left facial cellulitis. Changes are most commonly odontogenic in origin, although evaluation of the dentition limited on this exam by extensive streak artifact from dental amalgam. No discrete odontogenic abscess or other collection. Electronically Signed   By: Jeannine Boga M.D.   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  Patient does not have fever or leukocytosis.  Clinically does not seem to have cellulitis.  ED physician consulted ENT, "Dr.Marcellino of ENT feels this is likely blood, wants her  admitted overnight to reverse her INR/coumadin, elevate head of bed, apply ice, give decadron 10mg  IV q8h x3 doses". Air way is protected now. INR=6.01.  Patient has a history of a mechanic aortic valve replacement.  I discussed with pharmacist, who recommended to carefully and partially reverse Coumadin.  -will place on SDU for obs -Will give 2.5 mg of vitamin K by IV -Pain control: PRN Percocet and morphine -ice to left face -decadron as ENT recommended  Right arm swelling: also likely due to intramuscular bleeding. -See above and pain control  HTN:  -Continue home medications: Metoprolol -IV hydralazine prn  Bipolar disorder (HCC) and depression: -Cymbalta, Lamictal  Aortic stenosis due to bicuspid aortic valve: 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. INR=6.01 -will partially reverse Coumadin as above -Check INR in the morning.  Goal of INR is 2.0-3.0.  Supratherapeutic INR: -see above  Hypokalemia: K=3,3.3 on admission. - Repleted - Check Mg level  DVT ppx: none (patient has supratherapeutic INR 6.01) Code Status: Full code Family Communication: Yes, patient's father    at bed side Disposition Plan:  Anticipate discharge back to previous home environment Consults called: ED physician discussed with ENT, Dr. Wellington Hampshire Admission status: SDU/obs   Date of Service 12/10/2018    [facility] Pager [phone]  If 7PM-7AM, please contact night-coverage [email or web address] Password [facility] 12/10/2018, 6:40 AM

## 2018-12-09 NOTE — Progress Notes (Signed)
Danielle Obrien is a 38 y.o. female with the following history as recorded in EpicCare:  Patient Active Problem List   Diagnosis Date Noted  . RTI (respiratory tract infection) 02/01/2018  . Long term (current) use of anticoagulants [Z79.01] 11/11/2017  . 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 Chronic, continuous use of opioids 09/06/2017  . Tobacco abuse 09/27/2015  . Acute non-recurrent maxillary sinusitis 08/20/2015  . SI (sacroiliac) joint dysfunction 02/20/2015  . Leg length discrepancy 02/20/2015  . Allergic rhinitis, cause unspecified 05/04/2014  . Aortic stenosis due to bicuspid aortic valve 02/20/2014  . Bipolar disorder ([place]) 02/05/2014  . Migraine with status migrainosus 01/27/2013  . Hyperlipidemia with target LDL less than 130 01/27/2013  . Mild persistent asthma 04/21/2011  . Essential hypertension, benign 04/21/2011  . Cough 04/21/2011    Current Outpatient Medications  Medication Sig Dispense Refill  . albuterol (PROVENTIL HFA;VENTOLIN HFA) 108 (90 Base) MCG/ACT inhaler Inhale 2 puffs into the lungs every 6 (six) hours as needed for wheezing or shortness of breath. 1 Inhaler 3  . aspirin EC 81 MG tablet Take 1 tablet (81 mg total) by mouth daily. 90 tablet 3  . busPIRone (BUSPAR) 30 MG tablet Take one tablet by mouth twice daily. 60 tablet 0  . DULoxetine (CYMBALTA) 20 MG capsule TAKE ONE CAPSULE BY MOUTH DAILY (Patient taking differently: Take 20 mg by mouth daily. ) 90 capsule 1  . gabapentin (NEURONTIN) 300 MG capsule TAKE 2 CAPSULES BY MOUTH THREE TIMES A DAY (Patient taking differently: Take 600 mg by mouth 3 (three) times daily. ) 360 capsule 1  . lamoTRIgine (LAMICTAL) 200 MG tablet TAKE 2 TABLETS (400 MG TOTAL) BY MOUTH DAILY. (Patient taking differently: Take 400 mg by mouth daily. ) 180 tablet 1  . lansoprazole (PREVACID) 15 MG capsule Take 15 mg by mouth daily as needed (for heartburn or acid reflux).      . metoprolol tartrate (LOPRESSOR) 25 MG tablet Take 0.5 tablets (12.5 mg total) by mouth 2 (two) times daily. (Patient taking differently: Take 12.5 mg by mouth daily. ) 90 tablet 3  . traMADol (ULTRAM) 50 MG tablet TAKE TWO TABLETS BY MOUTH TWICE A DAY 120 tablet 1  . warfarin (COUMADIN) 7.5 MG tablet Take as directed by [facility] 40 tablet 1  . warfarin (COUMADIN) 7.5 MG tablet Take 1 tab daily except 1.5 tabs on Sun, Tues, Thurs or take as directed by [facility]. 40 tablet 3   No current facility-administered medications for this visit.     Allergies: Demerol; Ibuprofen; and Codeine  Past Medical History:  Diagnosis Date  . Abnormal Pap smear of cervix    --recurrent ascus w/Pos. HR HPV  . ADD (attention deficit disorder)   . Alcohol abuse, in remission 2012  . Allergy   . Anxiety   . Aortic stenosis due to bicuspid aortic valve 02/20/2014  . Arthritis    In hips  . Asthma    rare inhaler use  . Bipolar disorder ([place])   . Headache   . Hyperlipidemia with target LDL less than 130 01/27/2013  . Hypertension   . Muscle spasms of both lower extremities    both hips  . 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 Tobacco abuse 09/27/2015  . 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 ABDOMINAL HYSTERECTOMY  02/06/2009   Robotic total laparoscopic hysterectomy  . ANTERIOR CRUCIATE LIGAMENT REPAIR  1993  . AORTIC VALVE REPLACEMENT N/A 11/03/2017   Procedure: MINIMALLY INVASIVE AORTIC VALVE REPLACEMENT( MINI THORACOTOMY);  Surgeon: Rexene Alberts, MD;  Location: [place];  Service: Open Heart Surgery;  Laterality: N/A;  . CARDIAC CATHETERIZATION  June 2015   no CAD - moderate AS noted  . 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 COLPOSCOPY  10/2008   CIN I & II  . COLPOSCOPY  07/2000   Neg. ECC  . COLPOSCOPY  06/2001   CIN I  . COLPOSCOPY  08/2004   ECC--atypia  . COLPOSCOPY N/A 01/21/2016   Procedure: COLPOSCOPY with vaginal biopsy with CO 2 Laser of Vaginal and vulvar condyloma;  Surgeon: [name], MD;  Location: [facility];  Service: Gynecology;  Laterality: N/A;  [name] will be here 2/21 for 1115 case confirmed 01/16/15 - TS  . HERNIA REPAIR  1981/1982  . HIP SURGERY  1981   Hip Reset   . HIP SURGERY  1990   Plate was reconstructed/ took out growth plate in Right knee  . HIP SURGERY  1992   Plate removed in Left hip  . LEFT AND RIGHT HEART CATHETERIZATION WITH CORONARY ANGIOGRAM N/A 05/18/2014   Procedure: LEFT AND RIGHT HEART CATHETERIZATION WITH CORONARY ANGIOGRAM;  Surgeon: Jettie Booze, MD;  Location: [facility] CATH LAB;  Service: Cardiovascular;  Laterality: N/A;  . LYMPH NODE BIOPSY  1995  . NASAL SEPTUM SURGERY  2002  . TEE WITHOUT CARDIOVERSION N/A 11/03/2017   Procedure: TRANSESOPHAGEAL ECHOCARDIOGRAM (TEE);  Surgeon: Rexene Alberts, MD;  Location: [place];  Service: Open Heart Surgery;  Laterality: N/A;  . TONSILLECTOMY AND ADENOIDECTOMY  1990  . TYMPANOSTOMY TUBE PLACEMENT  1981/1982    Family History  Problem Relation Age of Onset  . Hyperlipidemia Mother   . Hypertension Mother   . Thyroid disease Mother   . Hyperlipidemia Father   . Hypertension Father   . Migraines Father   . Hypertension Brother   . Hyperlipidemia Brother   . Thyroid disease Maternal Grandmother   . Thyroid disease Maternal Grandfather   . Cancer Neg Hx     Social History   Tobacco Use  . Smoking status: Former Smoker    Packs/day: 0.50    Years: 12.00    Pack years: 6.00    Types: Cigarettes    Last attempt to quit: 11/02/2017    Years since quitting: 1.1  . Smokeless tobacco: Never Used  . Tobacco comment: pt given fake cigarette for hand/mouth  association. pt using [facility] for support  Substance Use Topics  . Alcohol use: No    Alcohol/week: 0.0 standard drinks    Subjective:  Patient presents with concerns for sudden onset of left sided facial swelling/ numbness; symptoms started in the past 24 hours; localized over left side of face- as day has gone having more difficulty opening her jaw; starting to feel numbness along the left side of her face/ upper neck; no fever, no cough or respiratory symptoms; Along with these symptoms developed sudden onset of swelling in her left knee; also concerned about red streaking along right forearm- bruised her elbow a few days ago and has since  developed redness/ pain- does take Coumadin related to valve replacement.      Objective:  Vitals:   12/09/18 1603  BP: 112/78  Pulse: 90  Temp: 98 F (36.7 C)  TempSrc: Oral  SpO2: 97%  Weight: 118 lb 1.3 oz (53.6 kg)  Height: 5' (1.524 m)    General: Well developed, well nourished, in no acute distress  Skin : Warm and dry. Swelling is noted over left cheek/ limited ROM in jaw;  Head: Normocephalic and atraumatic  Eyes: Sclera and conjunctiva clear; pupils round and reactive to light; extraocular movements intact  Ears: External normal; canals clear; tympanic membranes normal  Oropharynx: Pink, supple. No suspicious lesions  Neck: Supple without thyromegaly, adenopathy  Lungs: Respirations unlabored; clear to auscultation bilaterally without wheeze, rales, rhonchi  CVS exam: normal rate and regular rhythm.  Musculoskeletal: No deformities; swelling in left knee Extremities: No edema, cyanosis, clubbing  Vessels: Symmetric bilaterally  Neurologic: Alert and oriented; speech intact; face symmetrical; moves all extremities well; CNII-XII intact without focal deficit  Assessment:  1. Facial swelling   2. Facial numbness   3. Redness of forearm     Plan:  Suspect parotid infection- less likely for mumps with no respiratory  symptoms; however, facial numbness and unexplained extremity swelling is concerning; seen on a Friday afternoon- feel ER evaluation is most appropriate treatment.    No follow-ups on file.  No orders of the defined types were placed in this encounter.   Requested Prescriptions    No prescriptions requested or ordered in this encounter

## 2018-12-10 ENCOUNTER — Other Ambulatory Visit: Payer: Self-pay

## 2018-12-10 ENCOUNTER — Encounter (HOSPITAL_COMMUNITY): Payer: Self-pay

## 2018-12-10 DIAGNOSIS — Z7901 Long term (current) use of anticoagulants: Secondary | ICD-10-CM

## 2018-12-10 DIAGNOSIS — F3177 Bipolar disorder, in partial remission, most recent episode mixed: Secondary | ICD-10-CM | POA: Diagnosis not present

## 2018-12-10 DIAGNOSIS — I10 Essential (primary) hypertension: Secondary | ICD-10-CM | POA: Diagnosis not present

## 2018-12-10 DIAGNOSIS — Q230 Congenital stenosis of aortic valve: Secondary | ICD-10-CM | POA: Diagnosis not present

## 2018-12-10 DIAGNOSIS — R220 Localized swelling, mass and lump, head: Secondary | ICD-10-CM | POA: Diagnosis not present

## 2018-12-10 DIAGNOSIS — M7989 Other specified soft tissue disorders: Secondary | ICD-10-CM

## 2018-12-10 LAB — BASIC METABOLIC PANEL
Anion gap: 7 (ref 5–15)
BUN: 8 mg/dL (ref 6–20)
CO2: 28 mmol/L (ref 22–32)
Calcium: 9.1 mg/dL (ref 8.9–10.3)
Chloride: 105 mmol/L (ref 98–111)
Creatinine, Ser: 0.86 mg/dL (ref 0.44–1.00)
GFR calc Af Amer: >60 mL/min (ref >60)
GFR calc non Af Amer: >60 mL/min (ref >60)
Glucose, Bld: 149 mg/dL — ABNORMAL HIGH (ref 70–99)
Potassium: 4.2 mmol/L (ref 3.5–5.1)
SODIUM: 140 mmol/L (ref 135–145)

## 2018-12-10 LAB — PROTIME-INR
INR: 1.68
Prothrombin Time: 19.6 seconds — ABNORMAL HIGH (ref 11.4–15.2)

## 2018-12-10 LAB — CBC
HCT: 39.7 % (ref 36.0–46.0)
HEMOGLOBIN: 13.2 g/dL (ref 12.0–15.0)
MCH: 29.8 pg (ref 26.0–34.0)
MCHC: 33.2 g/dL (ref 30.0–36.0)
MCV: 89.6 fL (ref 80.0–100.0)
Platelets: 184 10*3/uL (ref 150–400)
RBC: 4.43 MIL/uL (ref 3.87–5.11)
RDW: 12.5 % (ref 11.5–15.5)
WBC: 5.9 10*3/uL (ref 4.0–10.5)
nRBC: 0 % (ref 0.0–0.2)

## 2018-12-10 LAB — HIV ANTIBODY (ROUTINE TESTING W REFLEX): HIV Screen 4th Generation wRfx: NONREACTIVE

## 2018-12-10 LAB — HEPARIN LEVEL (UNFRACTIONATED): Heparin Unfractionated: <0.1 IU/mL — ABNORMAL LOW (ref 0.30–0.70)

## 2018-12-10 LAB — MAGNESIUM: Magnesium: 2.1 mg/dL (ref 1.7–2.4)

## 2018-12-10 MED ORDER — HYDRALAZINE HCL 20 MG/ML IJ SOLN: 5.0000 mg | INTRAMUSCULAR | Status: DC | PRN

## 2018-12-10 MED ORDER — ZOLPIDEM TARTRATE 5 MG PO TABS: 5.0000 mg | ORAL_TABLET | Freq: Every evening | ORAL | Status: DC | PRN

## 2018-12-10 MED ORDER — HEPARIN (PORCINE) 25000 UT/250ML-% IV SOLN
900.0000 [IU]/h | INTRAVENOUS | Status: DC
  Administered 2018-12-10: 700 [IU]/h via INTRAVENOUS
  Filled 2018-12-10: qty 250

## 2018-12-10 MED ORDER — HEPARIN (PORCINE) 25000 UT/250ML-% IV SOLN: 13.0000 [IU]/kg/h | INTRAVENOUS | Status: DC

## 2018-12-10 MED ORDER — PNEUMOCOCCAL VAC POLYVALENT 25 MCG/0.5ML IJ INJ: 0.5000 mL | INJECTION | INTRAMUSCULAR | Status: DC

## 2018-12-10 MED ORDER — INFLUENZA VAC SPLIT QUAD 0.5 ML IM SUSY
0.5000 mL | PREFILLED_SYRINGE | INTRAMUSCULAR | Status: DC
  Filled 2018-12-10: qty 0.5

## 2018-12-10 MED ORDER — TRAMADOL HCL 50 MG PO TABS
100.0000 mg | ORAL_TABLET | Freq: Two times a day (BID) | ORAL | Status: DC
  Administered 2018-12-10 – 2018-12-11 (×3): 100 mg via ORAL
  Filled 2018-12-10 (×3): qty 2

## 2018-12-10 NOTE — Progress Notes (Addendum)
[place] for heparin  Indication: mechanical AVR  Allergies  Allergen Reactions  . Demerol Nausea And Vomiting  . Ibuprofen Other (See Comments)    Gastritis   . Codeine Itching    Patient Measurements: Height: 5' (152.4 cm) Weight: 120 lb 3.2 oz (54.5 kg) IBW/kg (Calculated) : 45.5 HEPARIN DW (KG): 54.5  Vital Signs: Temp: 98 F (36.7 C) (01/11 1148) Temp Source: Oral (01/11 1148) BP: 115/68 (01/11 1148) Pulse Rate: 75 (01/11 1148)  Labs: Recent Labs    12/09/18 1845 12/10/18 0317 12/10/18 1817  HGB 12.1 13.2  --   HCT 37.0 39.7  --   PLT 165 184  --   LABPROT 52.6* 19.6*  --   INR 6.01* 1.68  --   HEPARINUNFRC  --   --  <0.10*  CREATININE 0.78 0.86  --     Estimated Creatinine Clearance: 63.7 mL/min (by C-G formula based on SCr of 0.86 mg/dL).  Assessment: 37y.o. female admitted with L facial swelling likely d/t intramuscular bleeding. On warfarin PTA for mechanical AVR and INR on admit 6.01, INR reversed with 2.5mg  IV Vitamin K 1/10.   1/11 INR down to subtherapeutic so pharmacy consulted to dose heparin infusion.  INR subtherapeutic (1.11). Heparin level therapeutic (0.37) on gtt at 900 units/hr. No bleeding noted.  Goal of Therapy:  INR 2-3 Heparin level 0.3-0.7 units/ml Monitor platelets by anticoagulation protocol: Yes   Plan:  Continue IV heparin at 900 units/hr Daily heparin level, INR, CBC, monitor for increased s/sx of bleeding Noted plan to resume coumadin 1/12 and transition to Lovenox  Sherlon Handing, PharmD, BCPS Clinical pharmacist  **Pharmacist phone directory can now be found on [email or web address] ([email or web address]).  Listed under [place]. 12/10/2018 8:19 PM

## 2018-12-10 NOTE — Progress Notes (Signed)
Admitted 4E01 vital signs stable facial swelling noted not acute respiratory distress. RN D. Niemala and NT Chelsea assited patient with CHg wipe and hospital Management consultant. Patient resting without complaint.

## 2018-12-10 NOTE — Progress Notes (Signed)
PROGRESS NOTE    Danielle Obrien  URK:[id number] DOB: 1981/11/18 DOA: 12/09/2018 PCP: Janith Lima, MD   Brief Narrative:  HPI On 12/09/2018 by Dr. Ivor Costa Danielle Obrien is a 38 y.o. 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  Pt states that she started having left facial swelling yesterday, which has been progressively and gradually getting worse.  She also has a severe pain, which is severe, 10 out of 10 severity, nonradiating.  She has trismus, feels difficult to open her mouth.  She denies any dental pain or dental infection recently.  No any injury. She also mentions that she has a right arm bruise from when she hit herself on something at work 2 days ago, but the bruising and swelling has progressed today and it feels sore. She denies any recent changes in medications, soaps or detergents, lotions, creams, animal or plant contact.  Patient does not have difficult breathing, chest pain, cough.  No nausea vomiting, diarrhea, abdominal pain, symptoms of UTI or unilateral weakness.  She states that she quit drinking alcohol 7 and half years ago, she continues to smoke cigarettes a little currently. Assessment & Plan   Left facial swelling -Noted to have bruising the left mandibular/cheek area.  Most likely intramuscular bleeding secondary to supratherapeutic INR -CT maxillofacial showed soft tissue swelling with inflammatory stranding involving the left face primarily left masticator, submandibular and parapharyngeal spaces.  Suspicious for acute left facial cellulitis.  Changes most commonly odontogenic in origin. (patient does admit to grinding her teeth) -ENT, Dr. Wonda Cerise, consulted.  Discussed with her, reviewed imaging, recommended continuing to apply ice, Decadron and hold  anticoagulation if possible.  No surgical intervention needed at this time -Patient was given vitamin K in the emergency department -Continue pain control  Supratherapeutic INR -Patient presented with an INR of 6.01 on admission.  Was given 2.5 mg of IV vitamin K -INR down to 1.68 -Unknown cause.  Patient was given antibiotics for sinusitis for 2019 -Reviewed patient's chart, INR has been within normal range for the last several months.  Will likely resume her home dose on discharge along with a Lovenox bridge.  Aortic stenosis secondary to bicuspid aortic valve -Status post minimally invasive aortic valve replacement with bileaflet mechanical valve -Patient will need to continue anticoagulation -Will place her on heparin for the next 24 hours, and likely transition to Lovenox and restart Coumadin on 12/11/2018  Right arm swelling -Status post trauma, suspect intramuscular bleeding -Continue conservative management  Essential hypertension -Continue metoprolol  Bipolar disorder -Continue Cymbalta, Lamictal  Hypokalemia -Resolved with replacement -Magnesium 2.1 -Continue to monitor  DVT Prophylaxis  Heparin  Code Status: Full  Family Communication: Father at bedside  Disposition Plan: Currently in observation. Will be placed on IV heparin and monitored closely for worsening edema/bleeding. If stable, will transition to lovenox/coumadin on 1/12. Suspect home in 24 hours  Consultants ENT, Dr. Wonda Cerise, via phone  Procedures  None  Antibiotics   Anti-infectives (From admission, onward)   None      Subjective:   Danielle Obrien seen and examined today.  Feels left facial swelling has improved and she is able to open her mouth without pain.  Denies any problems with eating or swallowing.  Does have some right arm (forearm) swelling and redness.  Denies current chest pain, shortness of  breath, abdominal pain, nausea or vomiting, diarrhea or constipation, dizziness or  headache.  Objective:   Vitals:   12/10/18 0002 12/10/18 0003 12/10/18 0330 12/10/18 0818  BP:  (!) 145/89 137/81 124/77  Pulse:    85  Resp:  16 15 16   Temp: 98.2 F (36.8 C)  97.8 F (36.6 C) 97.9 F (36.6 C)  TempSrc: Oral  Oral Oral  SpO2:  99% 98% 98%  Weight: 54.5 kg     Height: 5' (1.524 m)      No intake or output data in the 24 hours ending 12/10/18 1056 Filed Weights   12/10/18 0002  Weight: 54.5 kg    Exam  General: Well developed, well nourished, NAD, appears stated age  HEENT: NCAT, mucous membranes moist. Left mandibular/submandibular edema/bruising  Neck: Supple  Cardiovascular: S1 S2 auscultated, RRR, + click (mechanical)  Respiratory: Clear to auscultation bilaterally with equal chest rise  Abdomen: Soft, nontender, nondistended, + bowel sounds  Extremities: warm dry without cyanosis clubbing or edema. Right forearm with bruising noted on elbow, redness  Neuro: AAOx3, nonfocal  Psych: Normal affect and demeanor with intact judgement and insight   Data Reviewed: I have personally reviewed following labs and imaging studies  CBC: Recent Labs  Lab 12/09/18 1845 12/10/18 0317  WBC 5.4 5.9  NEUTROABS 2.7  --   HGB 12.1 13.2  HCT 37.0 39.7  MCV 90.0 89.6  PLT 165 644   Basic Metabolic Panel: Recent Labs  Lab 12/09/18 1845 12/10/18 0317  NA 138 140  K 3.3* 4.2  CL 106 105  CO2 26 28  GLUCOSE 102* 149*  BUN 12 8  CREATININE 0.78 0.86  CALCIUM 8.6* 9.1  MG  --  2.1   GFR: Estimated Creatinine Clearance: 63.7 mL/min (by C-G formula based on SCr of 0.86 mg/dL). Liver Function Tests: No results for input(s): AST, ALT, ALKPHOS, BILITOT, PROT, ALBUMIN in the last 168 hours. No results for input(s): LIPASE, AMYLASE in the last 168 hours. No results for input(s): AMMONIA in the last 168 hours. Coagulation Profile: Recent Labs  Lab 12/09/18 1845 12/10/18 0317  INR 6.01* 1.68   Cardiac Enzymes: No results for input(s): CKTOTAL,  CKMB, CKMBINDEX, TROPONINI in the last 168 hours. BNP (last 3 results) No results for input(s): PROBNP in the last 8760 hours. HbA1C: No results for input(s): HGBA1C in the last 72 hours. CBG: No results for input(s): GLUCAP in the last 168 hours. Lipid Profile: No results for input(s): CHOL, HDL, LDLCALC, TRIG, CHOLHDL, LDLDIRECT in the last 72 hours. Thyroid Function Tests: No results for input(s): TSH, T4TOTAL, FREET4, T3FREE, THYROIDAB in the last 72 hours. Anemia Panel: No results for input(s): VITAMINB12, FOLATE, FERRITIN, TIBC, IRON, RETICCTPCT in the last 72 hours. Urine analysis:    Component Value Date/Time   COLORURINE STRAW (A) 11/01/2017 1353   APPEARANCEUR CLEAR 11/01/2017 1353   LABSPEC 1.013 11/01/2017 1353   PHURINE 7.0 11/01/2017 1353   GLUCOSEU NEGATIVE 11/01/2017 1353   GLUCOSEU NEGATIVE 01/07/2015 1025   HGBUR NEGATIVE 11/01/2017 1353   BILIRUBINUR NEGATIVE 11/01/2017 1353   BILIRUBINUR n 09/18/2015 0911   KETONESUR NEGATIVE 11/01/2017 1353   PROTEINUR NEGATIVE 11/01/2017 1353   UROBILINOGEN negative 09/18/2015 0911   UROBILINOGEN 0.2 01/07/2015 1025   NITRITE NEGATIVE 11/01/2017 1353   LEUKOCYTESUR NEGATIVE 11/01/2017 1353   Sepsis Labs: @LABRCNTIP (procalcitonin:4,lacticidven:4)  )No results found for this or any previous visit (from the past 240 hour(s)).    Radiology Studies: Dg Forearm Right  Result Date: 12/09/2018 CLINICAL DATA:  Initial evaluation for acute right elbow bruising and swelling. EXAM: RIGHT FOREARM - 2 VIEW COMPARISON:  None. FINDINGS: There is no evidence of fracture or other focal bone lesions. Soft tissues demonstrate no acute finding. IV catheter in place. IMPRESSION: No acute osseous abnormality about the right forearm. Electronically Signed   By: Jeannine Boga M.D.   On: 12/09/2018 18:26   Ct Maxillofacial W Contrast  Result Date: 12/09/2018 CLINICAL DATA:  Initial evaluation for acute left facial swelling and trismus.  EXAM: CT MAXILLOFACIAL WITH CONTRAST TECHNIQUE: Multidetector CT imaging of the maxillofacial structures was performed with intravenous contrast. Multiplanar CT image reconstructions were also generated. CONTRAST:  67mL OMNIPAQUE IOHEXOL 300 MG/ML  SOLN COMPARISON:  None. FINDINGS: Osseous: No acute osseous abnormality within the face. No discrete osseous lesions. Orbits: Globes and orbital soft tissues within normal limits. Bony orbits intact. Sinuses: Paranasal sinuses are clear. Visualized mastoid air cells and middle ear cavities are well pneumatized and free of fluid. Soft tissues: Asymmetric soft tissue swelling with inflammatory stranding involving the left face, primarily involving the left masticator and submandibular spaces, with extension into the left parapharyngeal space. An odontogenic source is suspected, although evaluation of the dentition limited by extensive streak artifact from dental amalgam. No discernible or discrete odontogenic abscess. Inflammatory changes surround the left submandibular gland. Gland itself is relatively normal in appearance without evidence for sialolithiasis or acute solid adenitis. Visualized parotid glands within normal limits. No other acute inflammatory changes within the visualize face. Limited intracranial: Unremarkable. IMPRESSION: Soft tissue swelling with inflammatory stranding involving the left face, primarily involving the left masticator, submandibular, and parapharyngeal spaces. Findings suspicious for acute left facial cellulitis. Changes are most commonly odontogenic in origin, although evaluation of the dentition limited on this exam by extensive streak artifact from dental amalgam. No discrete odontogenic abscess or other collection. Electronically Signed   By: Jeannine Boga M.D.   On: 12/09/2018 20:44     Scheduled Meds: . busPIRone  30 mg Oral BID  . dexamethasone  10 mg Intravenous Q8H  . DULoxetine  20 mg Oral Daily  . gabapentin  600  mg Oral TID  . [START ON 12/11/2018] Influenza vac split quadrivalent PF  0.5 mL Intramuscular Tomorrow-1000  . lamoTRIgine  400 mg Oral Daily  . metoprolol tartrate  12.5 mg Oral Daily  . nicotine  21 mg Transdermal Daily  . pantoprazole  20 mg Oral Daily  . [START ON 12/11/2018] pneumococcal 23 valent vaccine  0.5 mL Intramuscular Tomorrow-1000   Continuous Infusions: . heparin       LOS: 0 days   Time Spent in minutes   45 minutes (greater than 50% of time spent with patient face to face, as well as reviewing old records, calling consults, and formulating a plan)  Shamieka Gullo D.O. on 12/10/2018 at 10:56 AM  Between 7am to 7pm - Please see pager noted on [email or web address]  After 7pm go to [email or web address]  And look for the night coverage person covering for me after hours  [facility] Office  [phone]

## 2018-12-10 NOTE — Progress Notes (Signed)
ANTICOAGULATION CONSULT NOTE - Initial Consult  Pharmacy Consult for heparin  Indication: mechanical AVR  Allergies  Allergen Reactions  . Demerol Nausea And Vomiting  . Ibuprofen Other (See Comments)    Gastritis   . Codeine Itching    Patient Measurements: Height: 5' (152.4 cm) Weight: 120 lb 3.2 oz (54.5 kg) IBW/kg (Calculated) : 45.5 HEPARIN DW (KG): 54.5  Vital Signs: Temp: 97.9 F (36.6 C) (01/11 0818) Temp Source: Oral (01/11 0818) BP: 124/77 (01/11 0818) Pulse Rate: 85 (01/11 0818)  Labs: Recent Labs    12/09/18 1845 12/10/18 0317  HGB 12.1 13.2  HCT 37.0 39.7  PLT 165 184  LABPROT 52.6* 19.6*  INR 6.01* 1.68  CREATININE 0.78 0.86    Estimated Creatinine Clearance: 63.7 mL/min (by C-G formula based on SCr of 0.86 mg/dL).   Medical History: Past Medical History:  Diagnosis Date  . Abnormal Pap smear of cervix    --recurrent ascus w/Pos. HR HPV  . ADD (attention deficit disorder)   . Alcohol abuse, in remission 2012  . Allergy   . Anxiety   . Aortic stenosis due to bicuspid aortic valve 02/20/2014  . Arthritis    In hips  . Asthma    rare inhaler use  . Bipolar disorder ([place])   . Headache   . Hyperlipidemia with target LDL less than 130 01/27/2013  . Hypertension   . Muscle spasms of both lower extremities    both hips  . 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 Tobacco abuse 09/27/2015  . 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 female admitted with L facial swelling likely d/t intramuscular bleeding. On warfarin PTA for mechanical AVR and INR on admit 6.01, INR reversed with 2.5mg  IV Vitamin K 1/10.   1/11: AM INR subtherapeutic at 1.68. Pharmacy consulted to dose heparin infusion. Will be cautious  given intramuscular bleeding. Hgb 13.2, pltc 184  Goal of Therapy:  INR 2-3 Heparin level 0.3-0.7 units/ml Monitor platelets by anticoagulation protocol: Yes   Plan:  No heparin bolus Start IV heparin at 700 units/hr Heparin level at 1700 Daily heparin level, INR, CBC, monitor for s/sx of bleeding  Thank you for involving pharmacy in this patient's care.  Janae Bridgeman, PharmD PGY1 Pharmacy Resident Phone: [phone] 12/10/2018 9:32 AM

## 2018-12-10 NOTE — Plan of Care (Signed)
  Problem: Education: Goal: Knowledge of General Education information will improve Description Including pain rating scale, medication(s)/side effects and non-pharmacologic comfort measures Outcome: Progressing   Problem: Health Behavior/Discharge Planning: Goal: Ability to manage health-related needs will improve Outcome: Progressing   

## 2018-12-11 DIAGNOSIS — I10 Essential (primary) hypertension: Secondary | ICD-10-CM | POA: Diagnosis not present

## 2018-12-11 DIAGNOSIS — F3177 Bipolar disorder, in partial remission, most recent episode mixed: Secondary | ICD-10-CM | POA: Diagnosis not present

## 2018-12-11 DIAGNOSIS — Q230 Congenital stenosis of aortic valve: Secondary | ICD-10-CM | POA: Diagnosis not present

## 2018-12-11 DIAGNOSIS — R220 Localized swelling, mass and lump, head: Secondary | ICD-10-CM | POA: Diagnosis not present

## 2018-12-11 LAB — BASIC METABOLIC PANEL
Anion gap: 7 (ref 5–15)
BUN: 8 mg/dL (ref 6–20)
CO2: 26 mmol/L (ref 22–32)
Calcium: 8.8 mg/dL — ABNORMAL LOW (ref 8.9–10.3)
Chloride: 107 mmol/L (ref 98–111)
Creatinine, Ser: 0.65 mg/dL (ref 0.44–1.00)
GFR calc Af Amer: >60 mL/min (ref >60)
GFR calc non Af Amer: >60 mL/min (ref >60)
Glucose, Bld: 132 mg/dL — ABNORMAL HIGH (ref 70–99)
Potassium: 3.9 mmol/L (ref 3.5–5.1)
Sodium: 140 mmol/L (ref 135–145)

## 2018-12-11 LAB — CBC
HEMATOCRIT: 37.5 % (ref 36.0–46.0)
Hemoglobin: 12.4 g/dL (ref 12.0–15.0)
MCH: 29.8 pg (ref 26.0–34.0)
MCHC: 33.1 g/dL (ref 30.0–36.0)
MCV: 90.1 fL (ref 80.0–100.0)
Platelets: 173 10*3/uL (ref 150–400)
RBC: 4.16 MIL/uL (ref 3.87–5.11)
RDW: 12.6 % (ref 11.5–15.5)
WBC: 11.1 10*3/uL — ABNORMAL HIGH (ref 4.0–10.5)
nRBC: 0 % (ref 0.0–0.2)

## 2018-12-11 LAB — PROTIME-INR
INR: 1.22
Prothrombin Time: 15.3 seconds — ABNORMAL HIGH (ref 11.4–15.2)

## 2018-12-11 LAB — HEPARIN LEVEL (UNFRACTIONATED): Heparin Unfractionated: 0.37 IU/mL (ref 0.30–0.70)

## 2018-12-11 MED ORDER — NICOTINE 21 MG/24HR TD PT24: 21.0000 mg | MEDICATED_PATCH | Freq: Every day | TRANSDERMAL | 0 refills | Status: DC

## 2018-12-11 MED ORDER — WARFARIN SODIUM 10 MG PO TABS
11.2500 mg | ORAL_TABLET | Freq: Once | ORAL | Status: DC
  Filled 2018-12-11: qty 1

## 2018-12-11 MED ORDER — ENOXAPARIN SODIUM 60 MG/0.6ML ~~LOC~~ SOLN: 55.0000 mg | Freq: Two times a day (BID) | SUBCUTANEOUS | 1 refills | Status: DC

## 2018-12-11 MED ORDER — WARFARIN - PHARMACIST DOSING INPATIENT: Freq: Every day | Status: DC

## 2018-12-11 MED ORDER — ENOXAPARIN SODIUM 60 MG/0.6ML ~~LOC~~ SOLN
55.0000 mg | Freq: Two times a day (BID) | SUBCUTANEOUS | Status: DC
  Administered 2018-12-11: 55 mg via SUBCUTANEOUS
  Filled 2018-12-11 (×2): qty 0.55

## 2018-12-11 NOTE — Discharge Instructions (Signed)
Hematoma  A hematoma is a collection of blood. A hematoma can happen:  · Under the skin.  · In an organ.  · In a body space.  · In a joint space.  · In other tissues.  The blood can thicken (clot) to form a lump that you can see and feel. The lump is often hard and may become sore and tender. The lump can be very small or very big. Most hematomas get better in a few days to weeks. However, some hematomas may be serious and need medical care.  What are the causes?  This condition is caused by:  · An injury.  · Blood that leaks under the skin.  · Problems from surgeries.  · Medical conditions that cause bleeding or bruising.  What increases the risk?  You are more likely to develop this condition if:  · You are an older adult.  · You use medicines that thin your blood.  What are the signs or symptoms?  Symptoms depend on where the hematoma is in your body.  · If the hematoma is under the skin, there is:  ? A firm lump on the body.  ? Pain and tenderness in the area.  ? Bruising. The skin above the lump may be blue, dark blue, purple-red, or yellowish.  · If the hematoma is deep in the tissues or body spaces, there may be:  ? Blood in the stomach. This may cause pain in the belly (abdomen), weakness, passing out (fainting), and shortness of breath.  ? Blood in the head. This may cause a headache, weakness, trouble speaking or understanding speech, or passing out.  How is this diagnosed?  This condition is diagnosed based on:  · Your medical history.  · A physical exam.  · Imaging tests, such as ultrasound or CT scan.  · Blood tests.  How is this treated?  Treatment depends on the cause, size, and location of the hematoma. Treatment may include:  · Doing nothing. Many hematomas go away on their own without treatment.  · Surgery or close monitoring. This may be needed for large hematomas or hematomas that affect the body's organs.  · Medicines. These may be given if a medical condition caused the hematoma.  Follow these  instructions at home:  Managing pain, stiffness, and swelling    · If told, put ice on the area.  ? Put ice in a plastic bag.  ? Place a towel between your skin and the bag.  ? Leave the ice on for 20 minutes, 2-3 times a day for the first two days.  · If told, put heat on the affected area after putting ice on the area for two days. Use the heat source that your doctor tells you to use. This could be a moist heat pack or a heating pad. To do this:  ? Place a towel between your skin and the heat source.  ? Leave the heat on for 20-30 minutes.  ? Remove the heat if your skin turns bright red. This is very important if you are unable to feel pain, heat, or cold. You may have a greater risk of getting burned.  · Raise (elevate) the affected area above the level of your heart while you are sitting or lying down.  · Wrap the affected area with an elastic bandage, if told by your doctor. Do not wrap the bandage too tightly.  · If your hematoma is on a leg or foot and   is painful, your doctor may give you crutches. Use them as told by your doctor.  General instructions  · Take over-the-counter and prescription medicines only as told by your doctor.  · Keep all follow-up visits as told by your doctor. This is important.  Contact a doctor if:  · You have a fever.  · The swelling or bruising gets worse.  · You start to get more hematomas.  Get help right away if:  · Your pain gets worse.  · Your pain is not getting better with medicine.  · Your skin over the hematoma breaks or starts to bleed.  · Your hematoma is in your chest or belly and you:  ? Pass out.  ? Feel weak.  ? Become short of breath.  · You have a hematoma on your scalp that is caused by a fall or injury, and you:  ? Have a headache that gets worse.  ? Have trouble speaking or understanding speech.  ? Become less alert or you pass out.  Summary  · A hematoma is a collection of blood in any part of your body.  · Most hematomas get better on their own in a few days  to weeks. Some may need medical care.  · Follow instructions from your doctor about how to care for your hematoma.  · Contact a doctor if the swelling or bruising gets worse, or if you are short of breath.  This information is not intended to replace advice given to you by your health care provider. Make sure you discuss any questions you have with your health care provider.  Document Released: 12/24/2004 Document Revised: 04/21/2018 Document Reviewed: 04/21/2018  Elsevier Interactive Patient Education © 2019 [employer]

## 2018-12-11 NOTE — Progress Notes (Signed)
[place] for heparin ->Lovenox/Coumadin  Indication: mechanical AVR  Allergies  Allergen Reactions  . Demerol Nausea And Vomiting  . Ibuprofen Other (See Comments)    Gastritis   . Codeine Itching    Patient Measurements: Height: 5' (152.4 cm) Weight: 120 lb 3.2 oz (54.5 kg) IBW/kg (Calculated) : 45.5 HEPARIN DW (KG): 54.5  Vital Signs: Temp: 98 F (36.7 C) (01/12 0300) Temp Source: Oral (01/12 0300) BP: 125/73 (01/12 0300) Pulse Rate: 73 (01/12 0300)  Labs: Recent Labs    12/09/18 1845 12/10/18 0317 12/10/18 1817 12/11/18 0242  HGB 12.1 13.2  --  12.4  HCT 37.0 39.7  --  37.5  PLT 165 184  --  173  LABPROT 52.6* 19.6*  --  15.3*  INR 6.01* 1.68  --  1.22  HEPARINUNFRC  --   --  <0.10* 0.37  CREATININE 0.78 0.86  --  0.65    Estimated Creatinine Clearance: 68.5 mL/min (by C-G formula based on SCr of 0.65 mg/dL).  Assessment: 37y.o. female admitted with L facial swelling likely d/t intramuscular bleeding. On warfarin PTA for mechanical AVR and INR on admit 6.01, INR reversed with 2.5mg  IV Vitamin K 1/10.  PTA coumadin dose: 11.25mg  on Tues and Thur and 7.5mg  on all other days.  1/11 INR down to subtherapeutic so pharmacy consulted to dose heparin infusion.  1/12 INR down to 1.1. MD order to change heparin to Lovenox and Coumadin. Will likely take a while for INR to become therapeutic after vit K reversal.  Goal of Therapy:  INR 2-3 Heparin level 0.3-0.7 units/ml Monitor platelets by anticoagulation protocol: Yes   Plan:  D/c heparin Lovenox 55 mg SQ q12h - first dose 1 hour after heparin d/c Coumadin 11.25mg  tonight CBC q72h while on Lovenox and daily INR  Sherlon Handing, PharmD, BCPS Clinical pharmacist  **Pharmacist phone directory can now be found on [email or web address] ([email or web address]).  Listed under [place]. 12/11/2018 6:49 AM

## 2018-12-11 NOTE — Progress Notes (Signed)
Pt. Educated on how to give lovenox shots to her self, pt. Successfully repeated back instructions and successfully gave her self a lovenox shot and 8:00 am this morning.

## 2018-12-11 NOTE — Discharge Summary (Signed)
Physician Discharge Summary  Danielle Obrien:[id number] DOB: Sep 16, 1981 DOA: 12/09/2018  PCP: Janith Lima, MD  Admit date: 12/09/2018 Discharge date: 12/11/2018  Time spent: 45 minutes  Recommendations for Outpatient Follow-up:  Patient will be discharged to home.  Patient will need to follow up with primary care provider within one week of discharge. Repeat INR in 2-3 days. Patient should continue medications as prescribed.  Patient should follow a heart healthy diet.   Discharge Diagnoses:  Principal diagnosis: Left facial swelling Supratherapeutic INR Aortic stenosis secondary to bicuspid aortic valve Right arm swelling Essential hypertension Bipolar disorder Hypokalemia  Discharge Condition: Stable  Diet recommendation: heart healthy  Filed Weights   12/10/18 0002  Weight: 54.5 kg    History of present illness:  On 12/09/2018 by Dr. Maylon Cos L Crumpis a 38 y.o.femalewith medical history significant ofhypertension, hyperlipidemia, asthma, GERD, alcohol abuse in remission, aortic valve replacement with mechanical valve due to aortic stenosis,tobacco abuse, who presents with left facial swellingand pain.  Pt states that she started having left facial swelling yesterday, whichhas been progressively and gradually getting worse. She also has a severe pain, which is severe, 10 out of 10 severity, nonradiating. She has trismus, feelsdifficult to open her mouth.She denies any dental pain or dental infection recently. No any injury. She also mentions that she has a right arm bruise from when she hit herself on something at work 2 days ago, but the bruising and swelling has progressed today and it feels sore. She denies any recent changes in medications, soaps or detergents, lotions, creams, animal or plant contact.Patient does not have difficult breathing, chest pain, cough. No nausea vomiting, diarrhea, abdominal pain, symptoms of UTI or unilateral  weakness. She states that she quit drinking alcohol 7 and half years ago, she continues to smoke cigarettes a little currently.  Hospital Course:  Left facial swelling -Noted to have bruising the left mandibular/cheek area.  Most likely intramuscular bleeding secondary to supratherapeutic INR -CT maxillofacial showed soft tissue swelling with inflammatory stranding involving the left face primarily left masticator, submandibular and parapharyngeal spaces.  Suspicious for acute left facial cellulitis.  Changes most commonly odontogenic in origin. (patient does admit to grinding her teeth) -ENT, Dr. Wonda Cerise, consulted.  Discussed with her, reviewed imaging, recommended continuing to apply ice, Decadron and hold anticoagulation if possible.  No surgical intervention needed at this time -Patient was given vitamin K in the emergency department -Continue pain control, ice packs  Supratherapeutic INR -Patient presented with an INR of 6.01 on admission.  Was given 2.5 mg of IV vitamin K -INR down to 1.22 -Unknown cause.  Patient was given antibiotics for sinusitis for 2019 -Reviewed patient's chart, INR has been within normal range for the last several months.   -Will resume her home dose on discharge along with a Lovenox bridge.  Aortic stenosis secondary to bicuspid aortic valve -Status post minimally invasive aortic valve replacement with bileaflet mechanical valve -Patient will need to continue anticoagulation -Was placed on heparin, transitioned to Lovenox and Coumadin -Patient given Lovenox injection instruction -Will need to follow-up with her Coumadin clinic in 2 to 3 days  Right arm swelling -Status post trauma, suspect intramuscular bleeding -Continue conservative management  Essential hypertension -Continue metoprolol  Bipolar disorder -Continue Cymbalta, Lamictal  Hypokalemia -Resolved with replacement -Magnesium 2.1  Consultants ENT, Dr. Wonda Cerise, via  phone  Procedures  None  Discharge Exam: Vitals:   12/11/18 0300 12/11/18 0816  BP: 125/73 123/73  Pulse: 73  82  Resp: 14 10  Temp: 98 F (36.7 C) 98 F (36.7 C)  SpO2: 98%    Continues to have some left-sided facial pain but states she is feeling better.  Denies current chest pain, shortness of breath, abdominal pain, nausea or vomiting, diarrhea or constipation, dizziness or headache.   General: Well developed, well nourished, NAD, appears stated age  18: [place], left mandibular/submandibular edema/bruising mucous membranes moist.  Neck: Supple  Cardiovascular: S1 S2 auscultated, click, RRR  Respiratory: Clear to auscultation bilaterally with equal chest rise  Abdomen: Soft, nontender, nondistended, + bowel sounds  Extremities: warm dry without cyanosis clubbing or edema.  Right forearm with bruising  Neuro: AAOx3, nonfocal  Psych: Appropriate mood and affect, pleasant  Discharge Instructions Discharge Instructions    Discharge instructions   Complete by:  As directed    Patient will be discharged to home.  Patient will need to follow up with primary care provider within one week of discharge. Repeat INR in 2-3 days. Patient should continue medications as prescribed.  Patient should follow a heart healthy diet.     Allergies as of 12/11/2018      Reactions   Demerol Nausea And Vomiting   Ibuprofen Other (See Comments)   Gastritis   Codeine Itching      Medication List    STOP taking these medications   aspirin EC 81 MG tablet     TAKE these medications   albuterol 108 (90 Base) MCG/ACT inhaler Commonly known as:  PROVENTIL HFA;VENTOLIN HFA Inhale 2 puffs into the lungs every 6 (six) hours as needed for wheezing or shortness of breath.   busPIRone 30 MG tablet Commonly known as:  BUSPAR Take one tablet by mouth twice daily.   DULoxetine 20 MG capsule Commonly known as:  CYMBALTA TAKE ONE CAPSULE BY MOUTH DAILY   enoxaparin 60 MG/0.6ML  injection Commonly known as:  LOVENOX Inject 0.55 mLs (55 mg total) into the skin every 12 (twelve) hours.   gabapentin 300 MG capsule Commonly known as:  NEURONTIN TAKE 2 CAPSULES BY MOUTH THREE TIMES A DAY What changed:  See the new instructions.   lamoTRIgine 200 MG tablet Commonly known as:  LAMICTAL TAKE 2 TABLETS (400 MG TOTAL) BY MOUTH DAILY. What changed:  See the new instructions.   metoprolol tartrate 25 MG tablet Commonly known as:  LOPRESSOR Take 0.5 tablets (12.5 mg total) by mouth 2 (two) times daily. What changed:  when to take this   nicotine 21 mg/24hr patch Commonly known as:  NICODERM CQ - dosed in mg/24 hours Place 1 patch (21 mg total) onto the skin daily.   PREVACID 15 MG capsule Generic drug:  lansoprazole Take 15 mg by mouth daily as needed (for heartburn or acid reflux).   traMADol 50 MG tablet Commonly known as:  ULTRAM TAKE TWO TABLETS BY MOUTH TWICE A DAY   warfarin 7.5 MG tablet Commonly known as:  COUMADIN Take as directed. If you are unsure how to take this medication, talk to your nurse or doctor. Original instructions:  Take as directed by Coumadin Clinic What changed:    how much to take  how to take this  when to take this  additional instructions      Allergies  Allergen Reactions  . Demerol Nausea And Vomiting  . Ibuprofen Other (See Comments)    Gastritis   . Codeine Itching   Follow-up Information    Janith Lima, MD. Schedule an appointment as  soon as possible for a visit in 1 week(s).   Specialty:  Internal Medicine Why:  Hospital follow up Contact information: [address] [address] [phone]            The results of significant diagnostics from this hospitalization (including imaging, microbiology, ancillary and laboratory) are listed below for reference.    Significant Diagnostic Studies: Dg Forearm Right  Result Date: 12/09/2018 CLINICAL DATA:  Initial evaluation  for acute right elbow bruising and swelling. EXAM: RIGHT FOREARM - 2 VIEW COMPARISON:  None. FINDINGS: There is no evidence of fracture or other focal bone lesions. Soft tissues demonstrate no acute finding. IV catheter in place. IMPRESSION: No acute osseous abnormality about the right forearm. Electronically Signed   By: Jeannine Boga M.D.   On: 12/09/2018 18:26   Ct Maxillofacial W Contrast  Result Date: 12/09/2018 CLINICAL DATA:  Initial evaluation for acute left facial swelling and trismus. EXAM: CT MAXILLOFACIAL WITH CONTRAST TECHNIQUE: Multidetector CT imaging of the maxillofacial structures was performed with intravenous contrast. Multiplanar CT image reconstructions were also generated. CONTRAST:  16mL OMNIPAQUE IOHEXOL 300 MG/ML  SOLN COMPARISON:  None. FINDINGS: Osseous: No acute osseous abnormality within the face. No discrete osseous lesions. Orbits: Globes and orbital soft tissues within normal limits. Bony orbits intact. Sinuses: Paranasal sinuses are clear. Visualized mastoid air cells and middle ear cavities are well pneumatized and free of fluid. Soft tissues: Asymmetric soft tissue swelling with inflammatory stranding involving the left face, primarily involving the left masticator and submandibular spaces, with extension into the left parapharyngeal space. An odontogenic source is suspected, although evaluation of the dentition limited by extensive streak artifact from dental amalgam. No discernible or discrete odontogenic abscess. Inflammatory changes surround the left submandibular gland. Gland itself is relatively normal in appearance without evidence for sialolithiasis or acute solid adenitis. Visualized parotid glands within normal limits. No other acute inflammatory changes within the visualize face. Limited intracranial: Unremarkable. IMPRESSION: Soft tissue swelling with inflammatory stranding involving the left face, primarily involving the left masticator, submandibular, and  parapharyngeal spaces. Findings suspicious for acute left facial cellulitis. Changes are most commonly odontogenic in origin, although evaluation of the dentition limited on this exam by extensive streak artifact from dental amalgam. No discrete odontogenic abscess or other collection. Electronically Signed   By: Jeannine Boga M.D.   On: 12/09/2018 20:44    Microbiology: No results found for this or any previous visit (from the past 240 hour(s)).   Labs: Basic Metabolic Panel: Recent Labs  Lab 12/09/18 1845 12/10/18 0317 12/11/18 0242  NA 138 140 140  K 3.3* 4.2 3.9  CL 106 105 107  CO2 26 28 26   GLUCOSE 102* 149* 132*  BUN 12 8 8   CREATININE 0.78 0.86 0.65  CALCIUM 8.6* 9.1 8.8*  MG  --  2.1  --    Liver Function Tests: No results for input(s): AST, ALT, ALKPHOS, BILITOT, PROT, ALBUMIN in the last 168 hours. No results for input(s): LIPASE, AMYLASE in the last 168 hours. No results for input(s): AMMONIA in the last 168 hours. CBC: Recent Labs  Lab 12/09/18 1845 12/10/18 0317 12/11/18 0242  WBC 5.4 5.9 11.1*  NEUTROABS 2.7  --   --   HGB 12.1 13.2 12.4  HCT 37.0 39.7 37.5  MCV 90.0 89.6 90.1  PLT 165 184 173   Cardiac Enzymes: No results for input(s): CKTOTAL, CKMB, CKMBINDEX, TROPONINI in the last 168 hours. BNP: BNP (last 3  results) No results for input(s): BNP in the last 8760 hours.  ProBNP (last 3 results) No results for input(s): PROBNP in the last 8760 hours.  CBG: No results for input(s): GLUCAP in the last 168 hours.     Signed:  Cristal Ford  [facility] 12/11/2018, 9:43 AM

## 2018-12-11 NOTE — Progress Notes (Signed)
Discharged to home with family office visits in place teaching done  

## 2018-12-12 ENCOUNTER — Telehealth: Payer: Self-pay

## 2018-12-12 NOTE — Telephone Encounter (Signed)
Pt called into clinic, pt was hospitalized for facial hematoma 12/09/18-12/11/18, INR was 6.01 upon admission.  Warfarin was held in hospital, pt resumed Warfarin today at previous dosage regimen per hospital discharge pt is taking 7.5mg  QD except 11.25mg  on Sundays, Tuesdays, and Thursdays.  Pt was also discharged on Lovenox 55mg  BID secondary to INR 1.22.  Since pt is resuming Warfarin today made appt for pt to follow-up in clinic on 12/15/18 at 3:30pm just to made sure INR not quickly rising.  Pt states she does not know why INR jumped up, no new meds, abx's, no diarrhea. Pt also advised to continue Lovenox injections BID until INR checked, pt discharged with 1 weeks worth of Lovenox.

## 2018-12-13 ENCOUNTER — Other Ambulatory Visit: Payer: Self-pay | Admitting: Internal Medicine

## 2018-12-13 ENCOUNTER — Other Ambulatory Visit: Payer: Self-pay | Admitting: Family Medicine

## 2018-12-13 DIAGNOSIS — F411 Generalized anxiety disorder: Secondary | ICD-10-CM

## 2018-12-14 NOTE — Telephone Encounter (Signed)
Letter pended and to Dr. Silva for review.  

## 2018-12-15 ENCOUNTER — Ambulatory Visit (INDEPENDENT_AMBULATORY_CARE_PROVIDER_SITE_OTHER): Payer: Managed Care, Other (non HMO)

## 2018-12-15 DIAGNOSIS — Z7901 Long term (current) use of anticoagulants: Secondary | ICD-10-CM

## 2018-12-15 DIAGNOSIS — Q230 Congenital stenosis of aortic valve: Secondary | ICD-10-CM | POA: Diagnosis not present

## 2018-12-15 DIAGNOSIS — Q231 Congenital insufficiency of aortic valve: Secondary | ICD-10-CM | POA: Diagnosis not present

## 2018-12-15 DIAGNOSIS — Z954 Presence of other heart-valve replacement: Secondary | ICD-10-CM

## 2018-12-15 LAB — POCT INR: INR: 1.8 — AB (ref 2.0–3.0)

## 2018-12-15 NOTE — Patient Instructions (Signed)
Description   Continue same dosage 1 tablet daily except 1.5 tablets on Sundays, Tuesdays and Thursdays. Continue on Lovenox twice daily until Saturday.   Recheck on Monday.  [facility] [phone] Main [phone]

## 2018-12-15 NOTE — Telephone Encounter (Signed)
Letter reviewed and signed by Dr. Quincy Simmonds. Letter placed in standard [place] Mail to address on file.   Encounter closed.

## 2018-12-16 ENCOUNTER — Other Ambulatory Visit: Payer: Self-pay | Admitting: Family Medicine

## 2018-12-16 NOTE — Telephone Encounter (Signed)
Refill done.  

## 2018-12-18 ENCOUNTER — Other Ambulatory Visit: Payer: Self-pay | Admitting: Family Medicine

## 2018-12-19 ENCOUNTER — Ambulatory Visit (INDEPENDENT_AMBULATORY_CARE_PROVIDER_SITE_OTHER): Payer: Managed Care, Other (non HMO)

## 2018-12-19 DIAGNOSIS — Z7901 Long term (current) use of anticoagulants: Secondary | ICD-10-CM | POA: Diagnosis not present

## 2018-12-19 DIAGNOSIS — Z954 Presence of other heart-valve replacement: Secondary | ICD-10-CM

## 2018-12-19 DIAGNOSIS — Q231 Congenital insufficiency of aortic valve: Secondary | ICD-10-CM | POA: Diagnosis not present

## 2018-12-19 DIAGNOSIS — Q230 Congenital stenosis of aortic valve: Secondary | ICD-10-CM

## 2018-12-19 LAB — POCT INR: INR: 2.4 (ref 2.0–3.0)

## 2018-12-19 NOTE — Patient Instructions (Signed)
Description   Continue same dosage 1 tablet daily except 1.5 tablets on Sundays, Tuesdays and Thursdays. Stop taking your Lovenox injections. Recheck INR on 1/30.  [facility]in Clinic [phone] Main [phone]

## 2018-12-22 ENCOUNTER — Encounter: Payer: Self-pay | Admitting: Internal Medicine

## 2018-12-22 ENCOUNTER — Ambulatory Visit (INDEPENDENT_AMBULATORY_CARE_PROVIDER_SITE_OTHER): Payer: Managed Care, Other (non HMO) | Admitting: Internal Medicine

## 2018-12-22 ENCOUNTER — Other Ambulatory Visit (INDEPENDENT_AMBULATORY_CARE_PROVIDER_SITE_OTHER): Payer: Managed Care, Other (non HMO)

## 2018-12-22 VITALS — BP 120/82 | HR 80 | Temp 97.9°F | Ht 60.0 in | Wt 123.8 lb

## 2018-12-22 DIAGNOSIS — R739 Hyperglycemia, unspecified: Secondary | ICD-10-CM

## 2018-12-22 DIAGNOSIS — R791 Abnormal coagulation profile: Secondary | ICD-10-CM | POA: Diagnosis not present

## 2018-12-22 DIAGNOSIS — R040 Epistaxis: Secondary | ICD-10-CM | POA: Insufficient documentation

## 2018-12-22 DIAGNOSIS — Z0000 Encounter for general adult medical examination without abnormal findings: Secondary | ICD-10-CM

## 2018-12-22 DIAGNOSIS — I10 Essential (primary) hypertension: Secondary | ICD-10-CM

## 2018-12-22 DIAGNOSIS — Z124 Encounter for screening for malignant neoplasm of cervix: Secondary | ICD-10-CM

## 2018-12-22 LAB — LIPID PANEL
Cholesterol: 256 mg/dL — ABNORMAL HIGH (ref 0–200)
HDL: 74.7 mg/dL (ref >39.00)
LDL CALC: 165 mg/dL — AB (ref 0–99)
NonHDL: 181.63
Total CHOL/HDL Ratio: 3
Triglycerides: 84 mg/dL (ref 0.0–149.0)
VLDL: 16.8 mg/dL (ref 0.0–40.0)

## 2018-12-22 LAB — BASIC METABOLIC PANEL
BUN: 14 mg/dL (ref 6–23)
CHLORIDE: 106 meq/L (ref 96–112)
CO2: 25 mEq/L (ref 19–32)
CREATININE: 0.81 mg/dL (ref 0.40–1.20)
Calcium: 9.1 mg/dL (ref 8.4–10.5)
GFR: 78.88 mL/min (ref >60.00)
Glucose, Bld: 66 mg/dL — ABNORMAL LOW (ref 70–99)
Potassium: 4.5 mEq/L (ref 3.5–5.1)
Sodium: 139 mEq/L (ref 135–145)

## 2018-12-22 LAB — HEMOGLOBIN A1C: Hgb A1c MFr Bld: 5.3 % (ref 4.6–6.5)

## 2018-12-22 NOTE — Patient Instructions (Signed)
Preventive Care 18-39 Years, Female Preventive care refers to lifestyle choices and visits with your health care provider that can promote health and wellness. What does preventive care include?   A yearly physical exam. This is also called an annual well check.  Dental exams once or twice a year.  Routine eye exams. Ask your health care provider how often you should have your eyes checked.  Personal lifestyle choices, including: ? Daily care of your teeth and gums. ? Regular physical activity. ? Eating a healthy diet. ? Avoiding tobacco and drug use. ? Limiting alcohol use. ? Practicing safe sex. ? Taking vitamin and mineral supplements as recommended by your health care provider. What happens during an annual well check? The services and screenings done by your health care provider during your annual well check will depend on your age, overall health, lifestyle risk factors, and family history of disease. Counseling Your health care provider may ask you questions about your:  Alcohol use.  Tobacco use.  Drug use.  Emotional well-being.  Home and relationship well-being.  Sexual activity.  Eating habits.  Work and work environment.  Method of birth control.  Menstrual cycle.  Pregnancy history. Screening You may have the following tests or measurements:  Height, weight, and BMI.  Diabetes screening. This is done by checking your blood sugar (glucose) after you have not eaten for a while (fasting).  Blood pressure.  Lipid and cholesterol levels. These may be checked every 5 years starting at age 20.  Skin check.  Hepatitis C blood test.  Hepatitis B blood test.  Sexually transmitted disease (STD) testing.  BRCA-related cancer screening. This may be done if you have a family history of breast, ovarian, tubal, or peritoneal cancers.  Pelvic exam and Pap test. This may be done every 3 years starting at age 21. Starting at age 30, this may be done every 5  years if you have a Pap test in combination with an HPV test. Discuss your test results, treatment options, and if necessary, the need for more tests with your health care provider. Vaccines Your health care provider may recommend certain vaccines, such as:  Influenza vaccine. This is recommended every year.  Tetanus, diphtheria, and acellular pertussis (Tdap, Td) vaccine. You may need a Td booster every 10 years.  Varicella vaccine. You may need this if you have not been vaccinated.  HPV vaccine. If you are 26 or younger, you may need three doses over 6 months.  Measles, mumps, and rubella (MMR) vaccine. You may need at least one dose of MMR. You may also need a second dose.  Pneumococcal 13-valent conjugate (PCV13) vaccine. You may need this if you have certain conditions and were not previously vaccinated.  Pneumococcal polysaccharide (PPSV23) vaccine. You may need one or two doses if you smoke cigarettes or if you have certain conditions.  Meningococcal vaccine. One dose is recommended if you are age 19-21 years and a first-year college student living in a residence hall, or if you have one of several medical conditions. You may also need additional booster doses.  Hepatitis A vaccine. You may need this if you have certain conditions or if you travel or work in places where you may be exposed to hepatitis A.  Hepatitis B vaccine. You may need this if you have certain conditions or if you travel or work in places where you may be exposed to hepatitis B.  Haemophilus influenzae type b (Hib) vaccine. You may need this if you   have certain risk factors. Talk to your health care provider about which screenings and vaccines you need and how often you need them. This information is not intended to replace advice given to you by your health care provider. Make sure you discuss any questions you have with your health care provider. Document Released: 01/12/2002 Document Revised: 06/29/2017  Document Reviewed: 09/17/2015 Elsevier Interactive Patient Education  2019 [employer].

## 2018-12-22 NOTE — Progress Notes (Signed)
Subjective:  Patient ID: Danielle Obrien, female    DOB: 31-Dec-1980  Age: 38 y.o. MRN: [id number]  CC: Annual Exam   HPI Danielle Obrien presents for a CPX.  She was recently seen in the ED for facial swelling and bruising.  She was found to have a supratherapeutic INR and a spontaneous hematoma on the left side of her face.  At that time she had some numbness and tingling on the left side of her face and neck but she tells me that those symptoms have resolved.  She was told to follow-up with ENT.  She complains of intermittent epistaxis.  Outpatient Medications Prior to Visit  Medication Sig Dispense Refill  . albuterol (PROVENTIL HFA;VENTOLIN HFA) 108 (90 Base) MCG/ACT inhaler Inhale 2 puffs into the lungs every 6 (six) hours as needed for wheezing or shortness of breath. 1 Inhaler 3  . busPIRone (BUSPAR) 30 MG tablet TAKE ONE TABLET BY MOUTH TWICE A DAY 60 tablet 0  . DULoxetine (CYMBALTA) 20 MG capsule TAKE ONE CAPSULE BY MOUTH DAILY 30 capsule 0  . gabapentin (NEURONTIN) 300 MG capsule TAKE 2 CAPSULES BY MOUTH THREE TIMES A DAY 360 capsule 0  . lamoTRIgine (LAMICTAL) 200 MG tablet TAKE 2 TABLETS (400 MG TOTAL) BY MOUTH DAILY. (Patient taking differently: Take 400 mg by mouth daily. ) 180 tablet 1  . lansoprazole (PREVACID) 15 MG capsule Take 15 mg by mouth daily as needed (for heartburn or acid reflux).     . metoprolol tartrate (LOPRESSOR) 25 MG tablet Take 0.5 tablets (12.5 mg total) by mouth 2 (two) times daily. (Patient taking differently: Take 12.5 mg by mouth daily. ) 90 tablet 3  . nicotine (NICODERM CQ - DOSED IN MG/24 HOURS) 21 mg/24hr patch Place 1 patch (21 mg total) onto the skin daily. 28 patch 0  . traMADol (ULTRAM) 50 MG tablet TAKE TWO TABLETS BY MOUTH TWICE A DAY (Patient taking differently: Take 100 mg by mouth 2 (two) times daily. ) 120 tablet 1  . warfarin (COUMADIN) 7.5 MG tablet Take as directed by [facility] Clinic (Patient taking differently: Take 7.5-11.25 mg by  mouth See admin instructions. Sundays, Tuesdays and Thursdays take 11.25mg . Mondays, Wednesdays, Fridays and Saturdays take 7.5mg .) 40 tablet 1  . enoxaparin (LOVENOX) 60 MG/0.6ML injection Inject 0.55 mLs (55 mg total) into the skin every 12 (twelve) hours. 14 Syringe 1   No facility-administered medications prior to visit.     ROS Review of Systems  Constitutional: Negative.  Negative for chills, fatigue and fever.  HENT: Positive for nosebleeds. Negative for congestion, facial swelling, postnasal drip, sinus pressure, sinus pain, sore throat and voice change.   Respiratory: Negative for cough, chest tightness, shortness of breath and wheezing.   Cardiovascular: Negative for chest pain, palpitations and leg swelling.  Gastrointestinal: Negative for abdominal pain, constipation, diarrhea, nausea and vomiting.  Genitourinary: Negative.  Negative for decreased urine volume, difficulty urinating, dysuria, hematuria and urgency.  Musculoskeletal: Negative.  Negative for arthralgias and back pain.  Skin: Negative.  Negative for color change and pallor.  Neurological: Negative.  Negative for dizziness, weakness and headaches.  Hematological: Negative for adenopathy. Bruises/bleeds easily.  Psychiatric/Behavioral: Negative.     Objective:  BP 120/82 (BP Location: Left Arm, Patient Position: Sitting, Cuff Size: Normal)   Pulse 80   Temp 97.9 F (36.6 C) (Oral)   Ht 5' (1.524 m)   Wt 123 lb 12 oz (56.1 kg)   SpO2 97%   BMI  24.17 kg/m   BP Readings from Last 3 Encounters:  12/22/18 120/82  12/11/18 123/73  12/09/18 112/78    Wt Readings from Last 3 Encounters:  12/22/18 123 lb 12 oz (56.1 kg)  12/10/18 120 lb 3.2 oz (54.5 kg)  12/09/18 118 lb 1.3 oz (53.6 kg)    Physical Exam Vitals signs reviewed.  Constitutional:      Appearance: She is not ill-appearing or diaphoretic.  HENT:     Nose: No congestion or rhinorrhea.     Right Nostril: No foreign body or epistaxis.     Left  Nostril: No foreign body or epistaxis.     Right Turbinates: Not enlarged.     Left Turbinates: Not enlarged.     Right Sinus: No maxillary sinus tenderness or frontal sinus tenderness.     Left Sinus: No maxillary sinus tenderness or frontal sinus tenderness.     Mouth/Throat:     Mouth: Mucous membranes are moist.     Pharynx: No oropharyngeal exudate or posterior oropharyngeal erythema.  Eyes:     General: No scleral icterus.    Conjunctiva/sclera: Conjunctivae normal.  Neck:     Musculoskeletal: Normal range of motion. No neck rigidity or muscular tenderness.  Cardiovascular:     Rate and Rhythm: Normal rate and regular rhythm.     Heart sounds: No murmur. No gallop.      Comments: + click Pulmonary:     Effort: Pulmonary effort is normal.     Breath sounds: No stridor. No wheezing, rhonchi or rales.  Abdominal:     General: Abdomen is flat. Bowel sounds are normal.     Palpations: There is no hepatomegaly, splenomegaly or mass.     Tenderness: There is no abdominal tenderness.  Musculoskeletal: Normal range of motion.        General: No swelling.     Right lower leg: No edema.     Left lower leg: No edema.  Lymphadenopathy:     Cervical: No cervical adenopathy.  Skin:    General: Skin is warm.     Coloration: Skin is not pale.     Findings: Bruising present. No erythema or rash.     Comments: There are old ecchymoses noted on her extremities and left face. There are no new ecchymoses noted.  Neurological:     General: No focal deficit present.     Mental Status: She is oriented to person, place, and time. Mental status is at baseline.     Lab Results  Component Value Date   WBC 11.1 (H) 12/11/2018   HGB 12.4 12/11/2018   HCT 37.5 12/11/2018   PLT 173 12/11/2018   GLUCOSE 66 (L) 12/22/2018   CHOL 256 (H) 12/22/2018   TRIG 84.0 12/22/2018   HDL 74.70 12/22/2018   LDLDIRECT 171.0 01/27/2013   LDLCALC 165 (H) 12/22/2018   ALT 25 11/01/2017   AST 24 11/01/2017    NA 139 12/22/2018   K 4.5 12/22/2018   CL 106 12/22/2018   CREATININE 0.81 12/22/2018   BUN 14 12/22/2018   CO2 25 12/22/2018   TSH 0.64 01/14/2016   INR 2.4 12/19/2018   HGBA1C 5.3 12/22/2018    Dg Forearm Right  Result Date: 12/09/2018 CLINICAL DATA:  Initial evaluation for acute right elbow bruising and swelling. EXAM: RIGHT FOREARM - 2 VIEW COMPARISON:  None. FINDINGS: There is no evidence of fracture or other focal bone lesions. Soft tissues demonstrate no acute finding. IV catheter in place.  IMPRESSION: No acute osseous abnormality about the right forearm. Electronically Signed   By: Jeannine Boga M.D.   On: 12/09/2018 18:26   Ct Maxillofacial W Contrast  Result Date: 12/09/2018 CLINICAL DATA:  Initial evaluation for acute left facial swelling and trismus. EXAM: CT MAXILLOFACIAL WITH CONTRAST TECHNIQUE: Multidetector CT imaging of the maxillofacial structures was performed with intravenous contrast. Multiplanar CT image reconstructions were also generated. CONTRAST:  26mL OMNIPAQUE IOHEXOL 300 MG/ML  SOLN COMPARISON:  None. FINDINGS: Osseous: No acute osseous abnormality within the face. No discrete osseous lesions. Orbits: Globes and orbital soft tissues within normal limits. Bony orbits intact. Sinuses: Paranasal sinuses are clear. Visualized mastoid air cells and middle ear cavities are well pneumatized and free of fluid. Soft tissues: Asymmetric soft tissue swelling with inflammatory stranding involving the left face, primarily involving the left masticator and submandibular spaces, with extension into the left parapharyngeal space. An odontogenic source is suspected, although evaluation of the dentition limited by extensive streak artifact from dental amalgam. No discernible or discrete odontogenic abscess. Inflammatory changes surround the left submandibular gland. Gland itself is relatively normal in appearance without evidence for sialolithiasis or acute solid adenitis.  Visualized parotid glands within normal limits. No other acute inflammatory changes within the visualize face. Limited intracranial: Unremarkable. IMPRESSION: Soft tissue swelling with inflammatory stranding involving the left face, primarily involving the left masticator, submandibular, and parapharyngeal spaces. Findings suspicious for acute left facial cellulitis. Changes are most commonly odontogenic in origin, although evaluation of the dentition limited on this exam by extensive streak artifact from dental amalgam. No discrete odontogenic abscess or other collection. Electronically Signed   By: Jeannine Boga M.D.   On: 12/09/2018 20:44    Assessment & Plan:   Peyson was seen today for annual exam.  Diagnoses and all orders for this visit:  Essential hypertension, benign- Her blood pressure is adequately well controlled.  Electrolytes and renal function are normal. -     Basic metabolic panel; Future  Supratherapeutic INR- Her INR is perfect at 2.4.  Routine general medical examination at a health care facility- Exam completed, labs reviewed, vaccines reviewed and updated, patient education material was given. -     Lipid panel; Future  Hyperglycemia- Her blood sugars are normal now. -     Basic metabolic panel; Future -     Hemoglobin A1c; Future  Epistaxis -     Ambulatory referral to ENT  Cervical cancer screening -     Ambulatory referral to Gynecology   I have discontinued Danielle Obrien's enoxaparin. I am also having her maintain her lansoprazole, albuterol, warfarin, metoprolol tartrate, lamoTRIgine, traMADol, nicotine, busPIRone, gabapentin, and DULoxetine.  No orders of the defined types were placed in this encounter.    Follow-up: Return in about 6 months (around 06/22/2019).  [name], MD

## 2018-12-29 ENCOUNTER — Ambulatory Visit (INDEPENDENT_AMBULATORY_CARE_PROVIDER_SITE_OTHER): Payer: Managed Care, Other (non HMO) | Admitting: Pharmacist

## 2018-12-29 DIAGNOSIS — Z7901 Long term (current) use of anticoagulants: Secondary | ICD-10-CM

## 2018-12-29 DIAGNOSIS — Z954 Presence of other heart-valve replacement: Secondary | ICD-10-CM | POA: Diagnosis not present

## 2018-12-29 DIAGNOSIS — Q231 Congenital insufficiency of aortic valve: Secondary | ICD-10-CM | POA: Diagnosis not present

## 2018-12-29 DIAGNOSIS — Q230 Congenital stenosis of aortic valve: Secondary | ICD-10-CM | POA: Diagnosis not present

## 2018-12-29 LAB — POCT INR: INR: 2.3 (ref 2.0–3.0)

## 2018-12-29 NOTE — Patient Instructions (Signed)
Continue same dosage 1 tablet daily except 1.5 tablets on Sundays, Tuesdays and Thursdays. Recheck INR in 2 weeks.  C[facility]in Clinic [phone] Main [phone]

## 2019-01-05 ENCOUNTER — Encounter: Payer: Self-pay | Admitting: Internal Medicine

## 2019-01-09 ENCOUNTER — Other Ambulatory Visit: Payer: Self-pay | Admitting: Family Medicine

## 2019-01-09 DIAGNOSIS — F411 Generalized anxiety disorder: Secondary | ICD-10-CM

## 2019-01-12 ENCOUNTER — Ambulatory Visit (INDEPENDENT_AMBULATORY_CARE_PROVIDER_SITE_OTHER): Payer: Managed Care, Other (non HMO)

## 2019-01-12 ENCOUNTER — Ambulatory Visit (INDEPENDENT_AMBULATORY_CARE_PROVIDER_SITE_OTHER): Payer: Managed Care, Other (non HMO) | Admitting: Obstetrics and Gynecology

## 2019-01-12 ENCOUNTER — Encounter: Payer: Self-pay | Admitting: Obstetrics and Gynecology

## 2019-01-12 ENCOUNTER — Other Ambulatory Visit: Payer: Self-pay

## 2019-01-12 ENCOUNTER — Telehealth: Payer: Self-pay | Admitting: *Deleted

## 2019-01-12 ENCOUNTER — Other Ambulatory Visit (HOSPITAL_COMMUNITY)
Admission: RE | Admit: 2019-01-12 | Discharge: 2019-01-12 | Disposition: A | Discharge status: Home / Self Care | Payer: Managed Care, Other (non HMO) | Source: Ambulatory Visit | Attending: Obstetrics and Gynecology | Admitting: Obstetrics and Gynecology

## 2019-01-12 VITALS — BP 140/80 | HR 70 | Resp 14 | Ht 60.0 in | Wt 118.8 lb

## 2019-01-12 DIAGNOSIS — N6310 Unspecified lump in the right breast, unspecified quadrant: Secondary | ICD-10-CM

## 2019-01-12 DIAGNOSIS — Z01419 Encounter for gynecological examination (general) (routine) without abnormal findings: Secondary | ICD-10-CM | POA: Insufficient documentation

## 2019-01-12 DIAGNOSIS — Z954 Presence of other heart-valve replacement: Secondary | ICD-10-CM | POA: Diagnosis not present

## 2019-01-12 DIAGNOSIS — Q231 Congenital insufficiency of aortic valve: Secondary | ICD-10-CM | POA: Diagnosis not present

## 2019-01-12 DIAGNOSIS — Z113 Encounter for screening for infections with a predominantly sexual mode of transmission: Secondary | ICD-10-CM

## 2019-01-12 DIAGNOSIS — Q230 Congenital stenosis of aortic valve: Secondary | ICD-10-CM | POA: Diagnosis not present

## 2019-01-12 DIAGNOSIS — Z7901 Long term (current) use of anticoagulants: Secondary | ICD-10-CM | POA: Insufficient documentation

## 2019-01-12 LAB — POCT INR: INR: 4.3 — AB (ref 2.0–3.0)

## 2019-01-12 NOTE — Progress Notes (Signed)
38 y.o. G53P0010 Single Caucasian female here for annual exam.    Had an aortic valve replacement.  On Coumadin.  Her level is high.   Partner with hx hep C.  She wants STD screening.   Same partner for 11 years.  They do not live together.   PCP: [name], MD    No LMP recorded. Patient has had a hysterectomy.           Sexually active: Yes.   female The current method of family planning is status post hysterectomy.    Exercising: Yes.    walking Smoker:  Smokes only 1 cigarette/day  Health Maintenance: Pap: 05-17-17 Neg:Pos HR HPV History of abnormal Pap:  Yes, 05-17-17 Neg:Pos HR HPV;colpo revealed VAIN I, 09-18-15 LSIL:Pos HR HPV;colpo/vulvar bx 10-11-15--VAIN II and condyloma of vulva. MMG:  n/a Colonoscopy:  n/a BMD:   n/a  Result  n/a TDaP:  10-20-11 Gardasil:   no HIV: 12-09-18 NR Hep C: 05-17-17 Neg Screening Labs:  Hb today: PCP   reports that she quit smoking about 14 months ago. Her smoking use included cigarettes. She has a 6.00 pack-year smoking history. She has never used smokeless tobacco. She reports that she does not drink alcohol or use drugs.  Past Medical History:  Diagnosis Date  . Abnormal Pap smear of cervix    --recurrent ascus w/Pos. HR HPV  . ADD (attention deficit disorder)   . Alcohol abuse, in remission 2012  . Allergy   . Anxiety   . Aortic stenosis due to bicuspid aortic valve 02/20/2014  . Arthritis    In hips  . Asthma    rare inhaler use  . Bipolar disorder ([place])   . Headache   . Hyperlipidemia with target LDL less than 130 01/27/2013  . Hypertension   . Muscle spasms of both lower extremities    both hips  . 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 Tobacco abuse 09/27/2015  . 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 ABDOMINAL HYSTERECTOMY  02/06/2009   Robotic total laparoscopic hysterectomy  . ANTERIOR CRUCIATE LIGAMENT REPAIR  1993  . AORTIC VALVE REPLACEMENT N/A 11/03/2017   Procedure: MINIMALLY INVASIVE AORTIC VALVE REPLACEMENT( MINI THORACOTOMY);  Surgeon: Rexene Alberts, MD;  Location: [facility];  Service: Open Heart Surgery;  Laterality: N/A;  . CARDIAC CATHETERIZATION  June 2015   no CAD - moderate AS noted  . 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 COLPOSCOPY  10/2008   CIN I & II  . COLPOSCOPY  07/2000   Neg. ECC  . COLPOSCOPY  06/2001   CIN I  . COLPOSCOPY  08/2004   ECC--atypia  . COLPOSCOPY N/A 01/21/2016   Procedure: COLPOSCOPY with vaginal biopsy with CO 2 Laser of Vaginal and vulvar condyloma;  Surgeon: [name], MD;  Location: [facility];  Service: Gynecology;  Laterality: N/A;  [name] will be here 2/21 for 1115 case confirmed 01/16/15 - TS  . HERNIA REPAIR  1981/1982  . HIP SURGERY  1981   Hip Reset   . HIP SURGERY  1990   Plate was reconstructed/ took out growth plate in Right knee  .  HIP SURGERY  1992   Plate removed in Left hip  . LEFT AND RIGHT HEART CATHETERIZATION WITH CORONARY ANGIOGRAM N/A 05/18/2014   Procedure: LEFT AND RIGHT HEART CATHETERIZATION WITH CORONARY ANGIOGRAM;  Surgeon: Jettie Booze, MD;  Location: [facility] CATH [facility];  Service: Cardiovascular;  Laterality: N/A;  . LYMPH NODE BIOPSY  1995  . NASAL SEPTUM SURGERY  2002  . TEE WITHOUT CARDIOVERSION N/A 11/03/2017   Procedure: TRANSESOPHAGEAL ECHOCARDIOGRAM (TEE);  Surgeon: Rexene Alberts, MD;  Location: [facility];  Service: Open Heart Surgery;  Laterality: N/A;  . TONSILLECTOMY AND ADENOIDECTOMY  1990  . TYMPANOSTOMY TUBE PLACEMENT  1981/1982    Current Outpatient Medications  Medication Sig Dispense Refill  . albuterol (PROVENTIL HFA;VENTOLIN HFA) 108 (90 Base) MCG/ACT inhaler Inhale 2 puffs into the lungs  every 6 (six) hours as needed for wheezing or shortness of breath. 1 Inhaler 3  . busPIRone (BUSPAR) 30 MG tablet TAKE ONE TABLET BY MOUTH TWICE A DAY 60 tablet 0  . DULoxetine (CYMBALTA) 20 MG capsule TAKE ONE CAPSULE BY MOUTH DAILY 30 capsule 0  . gabapentin (NEURONTIN) 300 MG capsule TAKE 2 CAPSULES BY MOUTH THREE TIMES A DAY 360 capsule 0  . lamoTRIgine (LAMICTAL) 200 MG tablet TAKE 2 TABLETS (400 MG TOTAL) BY MOUTH DAILY. (Patient taking differently: Take 400 mg by mouth daily. ) 180 tablet 1  . lansoprazole (PREVACID) 15 MG capsule Take 15 mg by mouth daily as needed (for heartburn or acid reflux).     . metoprolol tartrate (LOPRESSOR) 25 MG tablet Take 0.5 tablets (12.5 mg total) by mouth 2 (two) times daily. (Patient taking differently: Take 12.5 mg by mouth daily. ) 90 tablet 3  . nicotine (NICODERM CQ - DOSED IN MG/24 HOURS) 21 mg/24hr patch Place 1 patch (21 mg total) onto the skin daily. 28 patch 0  . traMADol (ULTRAM) 50 MG tablet TAKE TWO TABLETS BY MOUTH TWICE A DAY (Patient taking differently: Take 100 mg by mouth 2 (two) times daily. ) 120 tablet 1  . warfarin (COUMADIN) 7.5 MG tablet Take as directed by Coumadin Clinic (Patient taking differently: Take 7.5-11.25 mg by mouth See admin instructions. Sundays, Tuesdays and Thursdays take 11.25mg . Mondays, Wednesdays, Fridays and Saturdays take 7.5mg .) 40 tablet 1   No current facility-administered medications for this visit.     Family History  Problem Relation Age of Onset  . Hyperlipidemia Mother   . Hypertension Mother   . Thyroid disease Mother   . Hyperlipidemia Father   . Hypertension Father   . Migraines Father   . Hypertension Brother   . Hyperlipidemia Brother   . Thyroid disease Maternal Grandmother   . Thyroid disease Maternal Grandfather   . Cancer Neg Hx     Review of Systems  Endocrine: Positive for cold intolerance (--but patient on Coumadin) and heat intolerance (--but patient on Coumadin).  All other  systems reviewed and are negative.   Exam:   BP 140/80 (BP Location: Right Arm, Patient Position: Sitting, Cuff Size: Normal)   Pulse 70   Resp 14   Ht 5' (1.524 m)   Wt 118 lb 12.8 oz (53.9 kg)   BMI 23.20 kg/m     General appearance: alert, cooperative and appears stated age Head: Normocephalic, without obvious abnormality, atraumatic Neck: no adenopathy, supple, symmetrical, trachea midline and thyroid normal to inspection and palpation Lungs: clear to auscultation bilaterally Breasts: left - normal appearance, no masses or tenderness, No nipple retraction or dimpling, No  nipple discharge or bleeding, No axillary or supraclavicular adenopathy Right - scar above medial breast and palpable plate under skin.  1 cm mass at 3:00 - 4:00.  No nipple retraction or dimpling, No nipple discharge or bleeding, No axillary or supraclavicular adenopathy Heart: regular rate and rhythm Abdomen: soft, non-tender; no masses, no organomegaly Extremities: extremities normal, atraumatic, no cyanosis or edema Skin: Skin color, texture, turgor normal. No rashes or lesions Lymph nodes: Cervical, supraclavicular, and axillary nodes normal. No abnormal inguinal nodes palpated Neurologic: Grossly normal  Pelvic: External genitalia:  Right mons with flaking brown skin.  4 - 5 mm condyloma of perineum.               Urethra:  normal appearing urethra with no masses, tenderness or lesions              Bartholins and Skenes: normal                 Vagina: normal appearing vagina with normal color and discharge, no lesions              Cervix:  absent              Pap taken: Yes. Bimanual Exam:  Uterus:  absent              Adnexa: no mass, fullness, tenderness             Chaperone was present for exam.  Assessment:   Well woman visit with normal exam. Status post robotic hysterectomy for cervical dysplasia.  Ovaries remain.  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 Quitting smoking.   Hx aortic valve replacement.  On coumadin.  Elevated INR.  Not a good candidate for blood draw or injections today. Right breast mass.   Plan:  Will schedule right breast US and possible dx mammogram.  Triage will contact the [facility] to inform of the metal plate she has in her right superior chest wall. Recommended self breast awareness. Pap and HR HPV as above. Guidelines for Calcium, Vitamin D, regular exercise program including cardiovascular and weight bearing exercise. Discussed [place].  She will return for serum STD screening.  She will return for tx of condyloma if desired.  Follow up annually and prn.   After visit summary provided.

## 2019-01-12 NOTE — Telephone Encounter (Signed)
Call to [facility] at [facility]. Advised that patient has a lump in right breast at 3-4 o'clock. Advised patient had aortic valve replacement surgery in 2018 and has a metal plate at the top of right breast. Dr. Quincy Simmonds asking if [place] can be done prior to [facility], so that Radiologist is aware of metal plate and know what is being pressed on. Kenney Houseman states will need to review with [employer] as that is not typical order/ protocol for Diagnostic MMG. Tonya advised could schedule where "Radiologist to do exam." States she will return call to office once reviews with [employer] on how to place orders and for scheduling. Patient in office when phone call made and updated. Patient agreeable.

## 2019-01-12 NOTE — Patient Instructions (Addendum)
  Description   Skip Friday and Saturday's dosage of Coumadin, then resume same dosage 1 tablet daily except 1.5 tablets on Sundays, Tuesdays and Thursdays. Recheck INR in 10 days.  Coumadin Clinic [phone] Main [phone]

## 2019-01-13 NOTE — Telephone Encounter (Signed)
Tonya from [facility] returned call and stated that the Site Manager had reviewed with 2 Radiologists about patient's situation and stated they all felt they would still start with the Diagnostic MMG and then proceed to the [place] they could make a note on appointment about the metal plate in right breast so that Radiologist is aware before proceeding. RN advised would update Dr. Quincy Simmonds and return call. Kenney Houseman states to return call directly to her at [phone], Ext: [phone].   Routing to provider for review.

## 2019-01-14 NOTE — Telephone Encounter (Signed)
I would recommend that the radiologist examine the area of the metal plate prior to proceeding with the mammogram on the day of the visit.

## 2019-01-15 LAB — HM PAP SMEAR

## 2019-01-16 ENCOUNTER — Other Ambulatory Visit: Payer: Self-pay | Admitting: Family Medicine

## 2019-01-16 NOTE — Telephone Encounter (Signed)
Spoke with Tonya at [facility]. Orders placed for bilateral DX MMG and right breast US, if needed. Radiologist to examine prior to start of MMG. Kenney Houseman will contact patient directly to schedule.   Routing to Dr. Antony Blackbird.

## 2019-01-16 NOTE — Addendum Note (Signed)
Addended by: Burnice Logan on: 01/16/2019 10:20 AM   Modules accepted: Orders

## 2019-01-16 NOTE — Telephone Encounter (Signed)
Call returned to [facility] at [facility], left detailed message, advised as seen below per Dr. Quincy Simmonds. Advised to return call to office at [phone] if any additional questions.   Encounter Closed.

## 2019-01-17 LAB — CYTOLOGY - PAP
Chlamydia: NEGATIVE
Diagnosis: NEGATIVE
HPV 16/18/45 GENOTYPING: NEGATIVE
HPV: DETECTED — AB
Neisseria Gonorrhea: NEGATIVE
Trichomonas: NEGATIVE

## 2019-01-18 ENCOUNTER — Other Ambulatory Visit: Payer: Self-pay | Admitting: *Deleted

## 2019-01-18 ENCOUNTER — Telehealth: Payer: Self-pay | Admitting: *Deleted

## 2019-01-18 MED ORDER — CLINDAMYCIN PHOSPHATE 2 % VA CREA: 1.0000 | TOPICAL_CREAM | Freq: Every day | VAGINAL | 0 refills | Status: AC

## 2019-01-18 NOTE — Telephone Encounter (Signed)
Rx pended for Flagyl 500 mg PO bid x7 days.   Routing to Dr. Quincy Simmonds to review contraindication between Flagyl and coumdin and advise on RX.

## 2019-01-18 NOTE — Progress Notes (Signed)
Opened in error

## 2019-01-18 NOTE — Telephone Encounter (Signed)
Please discontinue the order for Flagyl.  She can use Clindamycin vaginal cream 2%, one applicatorful at hs for one week instead.

## 2019-01-18 NOTE — Telephone Encounter (Addendum)
Notes recorded by Danielle Logan, RN on 01/18/2019 at 11:53 AM EST Spoke with patient, advised as seen below per Dr. Quincy Simmonds. Patient request Rx for flagyl PO to [facility] on file. ETOH precautions reviewed. Patient verbalizes understanding and is agreeable.   Contraindication indicated for flagyl and coumadin, see telephone encounter dated 2/19 to review RX with provider.   ------  Notes recorded by Danielle Logan, RN on 01/17/2019 at 5:02 PM EST Spoke with Danielle Obrien. At [facility] Cyt[facility], hpv subtyping 16/18/45 added. Add-on request faxed.  ----- Message from Danielle Cobbs, MD sent at 01/17/2019  4:07 PM EST ----- Please inform patient of her pap result which is normal but shows positive HR HPV and bacterial vaginosis.  She has a history of cervical dysplasia prehysterectomy and vaginal dysplasia post hysterectomy.   I recommend we do subtyping of the HPV with 16/18/45.  Please send this request to the lab.  The patient is currently on coumadin and has a high INR so colposcopy with biopsy is not preferable at this time.   I recommend treatment of the bacterial vaginosis.  She may treat with Flagyl 500 mg po bid for 7 days or Metrogel pv at hs for 5 nights.  Please send Rx to pharmacy of choice. ETOH precautions.   She tested negative for gonorrhea, chlamydia and trichomonas.

## 2019-01-18 NOTE — Telephone Encounter (Signed)
Call to patient, left detailed message, ok per dpr. Advised of contraindication between flagyl and coumadin, new Rx sent to [facility] for Clindamycin vaginal cream 2%, place one applicator full vaginally at night for 7 days. Return call to office if any additional questions.    Encounter closed.

## 2019-01-19 ENCOUNTER — Other Ambulatory Visit: Payer: Self-pay | Admitting: Internal Medicine

## 2019-01-19 DIAGNOSIS — F3177 Bipolar disorder, in partial remission, most recent episode mixed: Secondary | ICD-10-CM

## 2019-01-19 DIAGNOSIS — F411 Generalized anxiety disorder: Secondary | ICD-10-CM

## 2019-01-23 ENCOUNTER — Ambulatory Visit (INDEPENDENT_AMBULATORY_CARE_PROVIDER_SITE_OTHER): Payer: Managed Care, Other (non HMO)

## 2019-01-23 DIAGNOSIS — Z7901 Long term (current) use of anticoagulants: Secondary | ICD-10-CM | POA: Diagnosis not present

## 2019-01-23 DIAGNOSIS — Q230 Congenital stenosis of aortic valve: Secondary | ICD-10-CM

## 2019-01-23 DIAGNOSIS — Z954 Presence of other heart-valve replacement: Secondary | ICD-10-CM | POA: Diagnosis not present

## 2019-01-23 DIAGNOSIS — Q231 Congenital insufficiency of aortic valve: Secondary | ICD-10-CM | POA: Diagnosis not present

## 2019-01-23 LAB — POCT INR: INR: 3.2 — AB (ref 2.0–3.0)

## 2019-01-23 NOTE — Patient Instructions (Signed)
Description   Skip tomorrow's dosage of Coumadin, then start taking 1 tablet daily except 1.5 tablets on Sundays and Thursdays. Recheck INR in 2 weeks.  Coumadin Clinic [phone] Main [phone]

## 2019-01-24 ENCOUNTER — Telehealth: Payer: Self-pay | Admitting: *Deleted

## 2019-01-24 NOTE — Telephone Encounter (Signed)
LM for pt to call back. 08 recall in epic AEX scheduled 01/18/20

## 2019-01-24 NOTE — Telephone Encounter (Signed)
-----   Message from [name], MD sent at 01/22/2019 12:30 PM EST ----- Please share final HR HPV subtyping which was negative for 16/18/45.  I recommend a pap in 12 months with her annual exam.  Pap recall - 08 due to positive HR HPV and hx of cervical and vaginal dysplasia.  She is on coumadin and has an elevated INR.  I do not recommend colposcopy due to this.

## 2019-01-26 ENCOUNTER — Ambulatory Visit (INDEPENDENT_AMBULATORY_CARE_PROVIDER_SITE_OTHER): Payer: Managed Care, Other (non HMO) | Admitting: Family Medicine

## 2019-01-26 ENCOUNTER — Encounter: Payer: Self-pay | Admitting: Family Medicine

## 2019-01-26 VITALS — BP 128/82 | HR 80 | Temp 97.4°F | Ht 60.0 in | Wt 118.0 lb

## 2019-01-26 DIAGNOSIS — J209 Acute bronchitis, unspecified: Secondary | ICD-10-CM | POA: Diagnosis not present

## 2019-01-26 MED ORDER — PROMETHAZINE-DM 6.25-15 MG/5ML PO SYRP: 5.0000 mL | ORAL_SOLUTION | Freq: Four times a day (QID) | ORAL | 0 refills | Status: DC | PRN

## 2019-01-26 MED ORDER — DOXYCYCLINE HYCLATE 100 MG PO TABS: 100.0000 mg | ORAL_TABLET | Freq: Two times a day (BID) | ORAL | 0 refills | Status: DC

## 2019-01-26 NOTE — Progress Notes (Signed)
Patient ID: Danielle Obrien, female   DOB: [birth year], 38 y.o.   MRN: [id number]  PCP: Janith Lima, MD  Subjective:  Danielle Obrien is a 38 y.o. year old very pleasant female patient who presents with  symptoms including nasal congestion, sore throat, ear pressure, cough with green/brown sputum, chest congestion.  - does  have wheeze as well -started: two weeks ago , symptoms are not improving -previous treatments: OTC Tylenol cough and cold -sick contacts/travel/risks: denies flu exposure. Recent sick contact exposure.  Albuterol BID has provided benefit No recent antibiotic use  History of mild persistent asthma Former smoker   ROS-denies fever, NVD, tooth pain. Denies significant shortness of breath.   Pertinent Past Medical History-  Patient Active Problem List   Diagnosis Date Noted  . Hyperglycemia 12/22/2018  . Epistaxis 12/22/2018  . Cervical cancer screening 12/22/2018  . Supratherapeutic INR 12/09/2018  . Swelling in right armpit 12/09/2018  . Long term (current) use of anticoagulants [Z79.01] 11/11/2017  . 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 Chronic, continuous use of opioids 09/06/2017  . Tobacco abuse 09/27/2015  . SI (sacroiliac) joint dysfunction 02/20/2015  . Leg length discrepancy 02/20/2015  . Allergic rhinitis, cause unspecified 05/04/2014  . Aortic stenosis due to bicuspid aortic valve 02/20/2014  . Bipolar disorder ([place]) 02/05/2014  . Migraine with status migrainosus 01/27/2013  . Hyperlipidemia with target LDL less than 130 01/27/2013  . Routine general medical examination at a health care facility 10/20/2011  . Mild persistent asthma 04/21/2011  . Essential hypertension, benign 04/21/2011    Medications- reviewed  Current Outpatient Medications  Medication Sig Dispense Refill  . albuterol (PROVENTIL HFA;VENTOLIN HFA) 108 (90 Base) MCG/ACT inhaler Inhale 2 puffs into the lungs every 6  (six) hours as needed for wheezing or shortness of breath. 1 Inhaler 3  . busPIRone (BUSPAR) 30 MG tablet TAKE ONE TABLET BY MOUTH TWICE A DAY 60 tablet 0  . DULoxetine (CYMBALTA) 20 MG capsule TAKE ONE CAPSULE BY MOUTH DAILY 30 capsule 0  . gabapentin (NEURONTIN) 300 MG capsule TAKE 2 CAPSULES BY MOUTH THREE TIMES A DAY 360 capsule 0  . lamoTRIgine (LAMICTAL) 200 MG tablet Take 2 tablets (400 mg total) by mouth daily. 180 tablet 0  . lansoprazole (PREVACID) 15 MG capsule Take 15 mg by mouth daily as needed (for heartburn or acid reflux).     . metoprolol tartrate (LOPRESSOR) 25 MG tablet Take 0.5 tablets (12.5 mg total) by mouth 2 (two) times daily. (Patient taking differently: Take 12.5 mg by mouth daily. ) 90 tablet 3  . nicotine (NICODERM CQ - DOSED IN MG/24 HOURS) 21 mg/24hr patch Place 1 patch (21 mg total) onto the skin daily. 28 patch 0  . traMADol (ULTRAM) 50 MG tablet TAKE TWO TABLETS BY MOUTH TWICE A DAY (Patient taking differently: Take 100 mg by mouth 2 (two) times daily. ) 120 tablet 1  . warfarin (COUMADIN) 7.5 MG tablet Take as directed by [facility]in Clinic (Patient taking differently: Take 7.5-11.25 mg by mouth See admin instructions. Sundays, Tuesdays and Thursdays take 11.25mg . Mondays, Wednesdays, Fridays and Saturdays take 7.5mg .) 40 tablet 1   No current facility-administered medications for this visit.     Objective: BP 128/82 (BP Location: Left Arm, Patient Position: Sitting, Cuff Size: Normal)   Pulse 80   Temp (!) 97.4 F (36.3 C) (Oral)   Ht 5' (1.524 m)   Wt 118 lb (53.5 kg)  SpO2 97%   BMI 23.05 kg/m  Gen: NAD, resting comfortably HEENT: Turbinates erythematous, TM normal, pharynx mildly erythematous with no tonsilar exudate or edema, no sinus tenderness CV: RRR no murmurs rubs or gallops, +click Lungs: CTAB no crackles, wheeze, rhonchi  Ext: no edema Skin: warm, dry, no rash  Assessment/Plan: Patient's symptoms most consistent with bronchitis. Discussed  likely viral in nature and less likely bacterial cause. Benign lung exam and stable vital signs make pneumonia unlikely. Discussed possible treatment options and will provide a cough suppressant and also provided with a prescription for doxycycline due to duration of symptoms. Advised her to follow up for INR check. She agreed and will contact [facility] tomorrow to set up appointment.  Advised that if her symptoms were to worsen she should let us know. Close return precautions provided.     Laurita Quint, FNP

## 2019-01-26 NOTE — Patient Instructions (Addendum)
Please take medication as directed with food.  Please follow up for an INR check regarding your coumadin dose as these medications interfere with this medication.   If symptoms do not improve with treatment, worsen, or new symptoms develop, follow up for further evaluation.   Acute Bronchitis, Adult Acute bronchitis is when air tubes (bronchi) in the lungs suddenly get swollen. The condition can make it hard to breathe. It can also cause these symptoms:  A cough.  Coughing up clear, yellow, or green mucus.  Wheezing.  Chest congestion.  Shortness of breath.  A fever.  Body aches.  Chills.  A sore throat. Follow these instructions at home:  Medicines  Take over-the-counter and prescription medicines only as told by your doctor.  If you were prescribed an antibiotic medicine, take it as told by your doctor. Do not stop taking the antibiotic even if you start to feel better. General instructions  Rest.  Drink enough fluids to keep your pee (urine) pale yellow.  Avoid smoking and secondhand smoke. If you smoke and you need help quitting, ask your doctor. Quitting will help your lungs heal faster.  Use an inhaler, cool mist vaporizer, or humidifier as told by your doctor.  Keep all follow-up visits as told by your doctor. This is important. How is this prevented? To lower your risk of getting this condition again:  Wash your hands often with soap and water. If you cannot use soap and water, use hand sanitizer.  Avoid contact with people who have cold symptoms.  Try not to touch your hands to your mouth, nose, or eyes.  Make sure to get the flu shot every year. Contact a doctor if:  Your symptoms do not get better in 2 weeks. Get help right away if:  You cough up blood.  You have chest pain.  You have very bad shortness of breath.  You become dehydrated.  You faint (pass out) or keep feeling like you are going to pass out.  You keep throwing up  (vomiting).  You have a very bad headache.  Your fever or chills gets worse. This information is not intended to replace advice given to you by your health care provider. Make sure you discuss any questions you have with your health care provider. Document Released: 05/04/2008 Document Revised: 06/30/2017 Document Reviewed: 05/06/2016 Elsevier Interactive Patient Education  2019 [employer].

## 2019-01-27 ENCOUNTER — Telehealth: Payer: Self-pay | Admitting: Obstetrics and Gynecology

## 2019-01-27 NOTE — Telephone Encounter (Signed)
Tonya from the breast center asking to speak with nurse regarding patient. Number is [place] [phone].

## 2019-01-27 NOTE — Telephone Encounter (Signed)
Return call to [facility].  Left message to return call to [facility].   Pt needs Dx Bilateral MMG and R Breast Ultrasound:  Note per Dr. Quincy Simmonds: Right - scar above medial breast and palpable plate under skin.  1 cm mass at 3:00 - 4:00.  No nipple retraction or dimpling, No nipple discharge or bleeding, No axillary or supraclavicular adenopathy

## 2019-01-27 NOTE — Telephone Encounter (Signed)
Spoke with Kenney Houseman a the breast center.  The patient was scheduled for diagnostic breast imaging while in office with Dr. Quincy Simmonds.  She cancelled her diagnostic breast imaging.  The breast Center has called the patient x 2. Has not received return call to schedule follow up.   Routing to Dr. Quincy Simmonds.   Needs letter vs 30 day letter for breast imaging?

## 2019-01-29 NOTE — Telephone Encounter (Signed)
Send her a letter recommending imaging.  Please inform her that the Breast Center is aware of her prior surgical procedure on her chest wall and that the radiologist will be participating closely in the evaluation.

## 2019-01-31 NOTE — Telephone Encounter (Signed)
Call to patient.  She has been sick with bronchitis.  Message from Dr. Quincy Simmonds given.  She is agreeable to scheduling breast imaging.  Prefers Thursdays (day off) needs late PM on 12th or 19th.   [place], spoke with Anderson Malta. Noted need for radiology assistance with hx of metal plate.   Scheduled for 02/23/2019 at [place] arrival.  Call returned to patient and she accepts appointment.  Advised to please call if she cannot keep this appointment as scheduled.  Pt verbalized understanding and appreciates assistance with rescheduling. Continued hold.  Routing to provider and will close encounter.

## 2019-01-31 NOTE — Telephone Encounter (Signed)
Spoke with patient and she is advised of negative 16/18/45.  08 recall in place.  Encounter closed.

## 2019-02-01 ENCOUNTER — Other Ambulatory Visit: Payer: Self-pay | Admitting: Family Medicine

## 2019-02-07 ENCOUNTER — Other Ambulatory Visit: Payer: Self-pay | Admitting: Family Medicine

## 2019-02-07 ENCOUNTER — Other Ambulatory Visit: Payer: Self-pay | Admitting: Cardiology

## 2019-02-07 DIAGNOSIS — F411 Generalized anxiety disorder: Secondary | ICD-10-CM

## 2019-02-12 ENCOUNTER — Other Ambulatory Visit: Payer: Self-pay | Admitting: Family Medicine

## 2019-02-15 ENCOUNTER — Other Ambulatory Visit: Payer: Self-pay | Admitting: Family Medicine

## 2019-02-16 ENCOUNTER — Other Ambulatory Visit: Payer: Self-pay

## 2019-02-16 ENCOUNTER — Ambulatory Visit (INDEPENDENT_AMBULATORY_CARE_PROVIDER_SITE_OTHER): Payer: Managed Care, Other (non HMO) | Admitting: Pharmacist

## 2019-02-16 DIAGNOSIS — Z7901 Long term (current) use of anticoagulants: Secondary | ICD-10-CM | POA: Diagnosis not present

## 2019-02-16 DIAGNOSIS — Z954 Presence of other heart-valve replacement: Secondary | ICD-10-CM

## 2019-02-16 DIAGNOSIS — Q230 Congenital stenosis of aortic valve: Secondary | ICD-10-CM | POA: Diagnosis not present

## 2019-02-16 DIAGNOSIS — Q231 Congenital insufficiency of aortic valve: Secondary | ICD-10-CM

## 2019-02-16 LAB — POCT INR: INR: 2.3 (ref 2.0–3.0)

## 2019-02-16 NOTE — Patient Instructions (Signed)
Description   Continue taking 1 tablet daily except 1.5 tablets on Sundays and Thursdays. Recheck INR in 4 weeks.  [facility] [phone] Main [phone]

## 2019-02-23 ENCOUNTER — Other Ambulatory Visit: Payer: Self-pay

## 2019-02-23 ENCOUNTER — Ambulatory Visit
Admission: RE | Admit: 2019-02-23 | Discharge: 2019-02-23 | Disposition: A | Discharge status: Home / Self Care | Payer: Managed Care, Other (non HMO) | Source: Ambulatory Visit | Attending: Obstetrics and Gynecology | Admitting: Obstetrics and Gynecology

## 2019-02-23 DIAGNOSIS — N6310 Unspecified lump in the right breast, unspecified quadrant: Secondary | ICD-10-CM

## 2019-03-07 ENCOUNTER — Other Ambulatory Visit: Payer: Self-pay | Admitting: Family Medicine

## 2019-03-07 DIAGNOSIS — F411 Generalized anxiety disorder: Secondary | ICD-10-CM

## 2019-03-07 NOTE — Telephone Encounter (Signed)
Left message for patient to call back to set up virtual visit in order to obtain Rx refill.

## 2019-03-10 NOTE — Progress Notes (Signed)
[name] [place],  [address] Phone: [phone] Subjective:   Virtual Visit via Video Note  I connected with Danielle Obrien on 03/10/19 at 10:00 AM EDT by a video enabled telemedicine application and verified that I am speaking with the correct person using two identifiers.   I discussed the limitations of evaluation and management by telemedicine and the availability of in person appointments. The patient expressed understanding and agreed to proceed.     I discussed the assessment and treatment plan with the patient. The patient was provided an opportunity to ask questions and all were answered. The patient agreed with the plan and demonstrated an understanding of the instructions.   The patient was advised to call back or seek an in-person evaluation if the symptoms worsen or if the condition fails to improve as anticipated.  I provided 26 minutes of non-face-to-face time during this encounter.  Patient was at home. Discussed with her about corona virus. Just provider and patient present   Lyndal Pulley, DO  More detailed note below  CC: pain overall.   [id number]  Danielle Obrien is a 38 y.o. female coming in with complaint of pain all over, foot pain and back pain are the worse, been on cymbalta, tramadol and buspirone and doing well, essential worker and working a lot. Been on her feet more.  No swelling, sometimes pain aching at night 100mg  of tramadol 2 times a day.       Past Medical History:  Diagnosis Date  . Abnormal Pap smear of cervix    --recurrent ascus w/Pos. HR HPV  . ADD (attention deficit disorder)   . Alcohol abuse, in remission 2012  . Allergy   . Anxiety   . Aortic stenosis due to bicuspid aortic valve 02/20/2014  . Arthritis    In hips  . Asthma    rare inhaler use  . Bipolar disorder ([place])   . Headache   . Hyperlipidemia with target LDL less than 130 01/27/2013  . Hypertension   .  Muscle spasms of both lower extremities    both hips  . 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 Tobacco abuse 09/27/2015  . 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 ABDOMINAL HYSTERECTOMY  02/06/2009   Robotic total laparoscopic hysterectomy  . ANTERIOR CRUCIATE LIGAMENT REPAIR  1993  . AORTIC VALVE REPLACEMENT N/A 11/03/2017   Procedure: MINIMALLY INVASIVE AORTIC VALVE REPLACEMENT( MINI THORACOTOMY);  Surgeon: Rexene Alberts, MD;  Location: [facility];  Service: Open Heart Surgery;  Laterality: N/A;  . CARDIAC CATHETERIZATION  June 2015   no CAD - moderate AS noted  . 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 COLPOSCOPY  10/2008   CIN I & II  . COLPOSCOPY  07/2000   Neg. ECC  . COLPOSCOPY  06/2001   CIN I  . COLPOSCOPY  08/2004   ECC--atypia  . COLPOSCOPY N/A 01/21/2016   Procedure: COLPOSCOPY with vaginal biopsy with CO 2 Laser of Vaginal and vulvar condyloma;  Surgeon: [name], MD;  Location: [facility];  Service: Gynecology;  Laterality: N/A;  Danielle Obrien will be here 2/21  for 1115 case confirmed 01/16/15 - TS  . HERNIA REPAIR  1981/1982  . HIP SURGERY  1981   Hip Reset   . HIP SURGERY  1990   Plate was reconstructed/ took out growth plate in Right knee  . HIP SURGERY  1992   Plate removed in Left hip  . LEFT AND RIGHT HEART CATHETERIZATION WITH CORONARY ANGIOGRAM N/A 05/18/2014   Procedure: LEFT AND RIGHT HEART CATHETERIZATION WITH CORONARY ANGIOGRAM;  Surgeon: Jettie Booze, MD;  Location: [facility] CATH LAB;  Service: Cardiovascular;  Laterality: N/A;  . LYMPH NODE BIOPSY  1995  . NASAL SEPTUM SURGERY  2002  . TEE WITHOUT CARDIOVERSION N/A 11/03/2017   Procedure:  TRANSESOPHAGEAL ECHOCARDIOGRAM (TEE);  Surgeon: Rexene Alberts, MD;  Location: [facility];  Service: Open Heart Surgery;  Laterality: N/A;  . TONSILLECTOMY AND ADENOIDECTOMY  1990  . TYMPANOSTOMY TUBE PLACEMENT  1981/1982   Social History   Socioeconomic History  . Marital status: Single    Spouse name: Not on file  . Number of children: Not on file  . Years of education: Not on file  . Highest education level: Not on file  Occupational History  . Not on file  Social Needs  . Financial resource strain: Not hard at all  . Food insecurity:    Worry: Patient refused    Inability: Patient refused  . Transportation needs:    Medical: Patient refused    Non-medical: Patient refused  Tobacco Use  . Smoking status: Former Smoker    Packs/day: 0.50    Years: 12.00    Pack years: 6.00    Types: Cigarettes    Last attempt to quit: 11/02/2017    Years since quitting: 1.3  . Smokeless tobacco: Never Used  . Tobacco comment: pt given fake cigarette for hand/mouth association. pt using [facility] for support  Substance and Sexual Activity  . Alcohol use: No    Alcohol/week: 0.0 standard drinks  . Drug use: No  . Sexual activity: Yes    Partners: Male    Birth control/protection: Surgical    Comment: Hysterectomy  Lifestyle  . Physical activity:    Days per week: Patient refused    Minutes per session: Patient refused  . Stress: Not on file  Relationships  . Social connections:    Talks on phone: Patient refused    Gets together: Patient refused    Attends religious service: Patient refused    Active member of club or organization: Patient refused    Attends meetings of clubs or organizations: Patient refused    Relationship status: Patient refused  Other Topics Concern  . Not on file  Social History Narrative  . Not on file   Allergies  Allergen Reactions  . Demerol Nausea And Vomiting  . Ibuprofen Other (See Comments)    Gastritis   . Codeine Itching   Family  History  Problem Relation Age of Onset  . Hyperlipidemia Mother   . Hypertension Mother   . Thyroid disease Mother   . Hyperlipidemia Father   . Hypertension Father   . Migraines Father   . Hypertension Brother   . Hyperlipidemia Brother   . Thyroid disease Maternal Grandmother   . Thyroid disease Maternal Grandfather   . Cancer Neg Hx      Current Outpatient Medications (Cardiovascular):  .  metoprolol tartrate (LOPRESSOR) 25 MG tablet, Take 0.5 tablets (12.5 mg total) by mouth 2 (two) times daily. (Patient taking differently: Take  12.5 mg by mouth daily. )  Current Outpatient Medications (Respiratory):  .  albuterol (PROVENTIL HFA;VENTOLIN HFA) 108 (90 Base) MCG/ACT inhaler, Inhale 2 puffs into the lungs every 6 (six) hours as needed for wheezing or shortness of breath. .  promethazine-dextromethorphan (PROMETHAZINE-DM) 6.25-15 MG/5ML syrup, Take 5 mLs by mouth 4 (four) times daily as needed for cough.  Current Outpatient Medications (Analgesics):  .  traMADol (ULTRAM) 50 MG tablet, Take 2 tablets (100 mg total) by mouth 2 (two) times daily.  Current Outpatient Medications (Hematological):  .  warfarin (COUMADIN) 7.5 MG tablet, TAKE 1 TABLET BY MOUTH DAILY , EXCEPT 1.5 TABLETS ON SUNDAY, TUESDAY, THURSDAY OR TAKE AS DIRECTED BY COUMADIN CLINIC  Current Outpatient Medications (Other):  .  busPIRone (BUSPAR) 30 MG tablet, TAKE ONE TABLET BY MOUTH TWICE A DAY .  doxycycline (VIBRA-TABS) 100 MG tablet, Take 1 tablet (100 mg total) by mouth 2 (two) times daily. .  DULoxetine (CYMBALTA) 20 MG capsule, TAKE ONE CAPSULE BY MOUTH DAILY .  gabapentin (NEURONTIN) 300 MG capsule, TAKE TWO CAPSULES BY MOUTH THREE TIMES A DAY .  lamoTRIgine (LAMICTAL) 200 MG tablet, Take 2 tablets (400 mg total) by mouth daily. .  lansoprazole (PREVACID) 15 MG capsule, Take 15 mg by mouth daily as needed (for heartburn or acid reflux).  .  nicotine (NICODERM CQ - DOSED IN MG/24 HOURS) 21 mg/24hr patch, Place  1 patch (21 mg total) onto the skin daily.    Past medical history, social, surgical and family history all reviewed in electronic medical record.  No pertanent information unless stated regarding to the chief complaint.   Review of Systems:  No headache, visual changes, nausea, vomiting, diarrhea, constipation, dizziness, abdominal pain, skin rash, fevers, chills, night sweats, weight loss, swollen lymph nodes,chest pain, shortness of breath, mood changes. Positive muscle aches, body aches   Objective  No vitals    General: No apparent distress alert and oriented x3 mood and affect normal, dressed appropriately.  HEENT: Pupils equal, extraocular movements intact  Respiratory: Patient's speak in full sentences and does not appear short of breath     Impression and Recommendations:     This case required medical decision making of moderate complexity. The above documentation has been reviewed and is accurate and complete Lyndal Pulley, DO       Note: This dictation was prepared with Dragon dictation along with smaller phrase technology. Any transcriptional errors that result from this process are unintentional.

## 2019-03-12 ENCOUNTER — Other Ambulatory Visit: Payer: Self-pay | Admitting: Family Medicine

## 2019-03-13 ENCOUNTER — Ambulatory Visit (INDEPENDENT_AMBULATORY_CARE_PROVIDER_SITE_OTHER): Payer: Managed Care, Other (non HMO) | Admitting: Family Medicine

## 2019-03-13 ENCOUNTER — Encounter: Payer: Self-pay | Admitting: Family Medicine

## 2019-03-13 DIAGNOSIS — F1190 Opioid use, unspecified, uncomplicated: Secondary | ICD-10-CM

## 2019-03-13 DIAGNOSIS — F411 Generalized anxiety disorder: Secondary | ICD-10-CM | POA: Diagnosis not present

## 2019-03-13 MED ORDER — TRAMADOL HCL 50 MG PO TABS: 100.0000 mg | ORAL_TABLET | Freq: Two times a day (BID) | ORAL | 0 refills | Status: DC

## 2019-03-13 MED ORDER — BUSPIRONE HCL 30 MG PO TABS: 30.0000 mg | ORAL_TABLET | Freq: Two times a day (BID) | ORAL | 0 refills | Status: DC

## 2019-03-13 NOTE — Assessment & Plan Note (Signed)
Continues to have pain, discussed HEP, continue the orthotics, same meds, refilled pain meds today as well. Will recheck every 3 months. Warned of long term use of opiods and monitor for symptoms. Continue vitamins  RTC in 3 months

## 2019-03-15 ENCOUNTER — Telehealth: Payer: Self-pay

## 2019-03-15 NOTE — Telephone Encounter (Signed)
lmom for prescreen/drive thru 

## 2019-03-16 ENCOUNTER — Other Ambulatory Visit: Payer: Self-pay

## 2019-03-16 ENCOUNTER — Ambulatory Visit (INDEPENDENT_AMBULATORY_CARE_PROVIDER_SITE_OTHER): Payer: Managed Care, Other (non HMO) | Admitting: Pharmacist

## 2019-03-16 DIAGNOSIS — Q231 Congenital insufficiency of aortic valve: Secondary | ICD-10-CM

## 2019-03-16 DIAGNOSIS — Z954 Presence of other heart-valve replacement: Secondary | ICD-10-CM

## 2019-03-16 DIAGNOSIS — Q230 Congenital stenosis of aortic valve: Secondary | ICD-10-CM | POA: Diagnosis not present

## 2019-03-16 DIAGNOSIS — Z7901 Long term (current) use of anticoagulants: Secondary | ICD-10-CM

## 2019-03-16 LAB — POCT INR: INR: 3.9 — AB (ref 2.0–3.0)

## 2019-03-16 NOTE — Telephone Encounter (Signed)

## 2019-03-22 ENCOUNTER — Telehealth: Payer: Self-pay | Admitting: Cardiology

## 2019-03-22 ENCOUNTER — Telehealth: Payer: Self-pay | Admitting: *Deleted

## 2019-03-22 NOTE — Telephone Encounter (Signed)
Spoke with patient, advised as seen below per Dr. Quincy Simmonds. OV scheduled for 05/18/19 at 0800 with Dr. Quincy Simmonds. Patient verbalize understanding and is agreeable.   Encounter closed.

## 2019-03-22 NOTE — Telephone Encounter (Signed)
Spoke with the patient, discussed covid and how to reduce her risk factors. She expressed understanding.

## 2019-03-22 NOTE — Telephone Encounter (Signed)
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 She is not wondering what she needs to do    Please call

## 2019-03-22 NOTE — Telephone Encounter (Signed)
Notes recorded by Burnice Logan, RN on 03/22/2019 at 9:09 AM EDT Left message to call Sharee Pimple, RN at [facility].   ------

## 2019-03-22 NOTE — Telephone Encounter (Signed)
-----   Message from [name], MD sent at 03/21/2019  3:54 PM EDT ----- Please schedule a breast recheck with me for 2 months from now.  She can be removed from mammogram hold.   [place]

## 2019-03-29 ENCOUNTER — Telehealth: Payer: Self-pay

## 2019-03-29 NOTE — Telephone Encounter (Signed)
lmom for prescreen  

## 2019-03-30 ENCOUNTER — Other Ambulatory Visit: Payer: Self-pay

## 2019-03-30 ENCOUNTER — Ambulatory Visit (INDEPENDENT_AMBULATORY_CARE_PROVIDER_SITE_OTHER): Payer: Managed Care, Other (non HMO) | Admitting: Pharmacist

## 2019-03-30 DIAGNOSIS — Z954 Presence of other heart-valve replacement: Secondary | ICD-10-CM | POA: Diagnosis not present

## 2019-03-30 DIAGNOSIS — Z7901 Long term (current) use of anticoagulants: Secondary | ICD-10-CM

## 2019-03-30 DIAGNOSIS — Q231 Congenital insufficiency of aortic valve: Secondary | ICD-10-CM | POA: Diagnosis not present

## 2019-03-30 DIAGNOSIS — Q230 Congenital stenosis of aortic valve: Secondary | ICD-10-CM

## 2019-03-30 LAB — POCT INR: INR: 3 (ref 2.0–3.0)

## 2019-04-08 ENCOUNTER — Other Ambulatory Visit: Payer: Self-pay | Admitting: Family Medicine

## 2019-04-08 DIAGNOSIS — F411 Generalized anxiety disorder: Secondary | ICD-10-CM

## 2019-04-16 ENCOUNTER — Other Ambulatory Visit: Payer: Self-pay | Admitting: Internal Medicine

## 2019-04-16 DIAGNOSIS — F3177 Bipolar disorder, in partial remission, most recent episode mixed: Secondary | ICD-10-CM

## 2019-04-16 DIAGNOSIS — F411 Generalized anxiety disorder: Secondary | ICD-10-CM

## 2019-04-19 ENCOUNTER — Other Ambulatory Visit: Payer: Self-pay | Admitting: Family Medicine

## 2019-04-19 IMAGING — CR DG FOREARM 2V*R*
2 series · 2 of 2 positions shown · non-contrast
Comparison: None.

CLINICAL DATA: Initial evaluation for acute right elbow bruising
and swelling.

EXAM:
RIGHT FOREARM - 2 VIEW

[forearm ap]
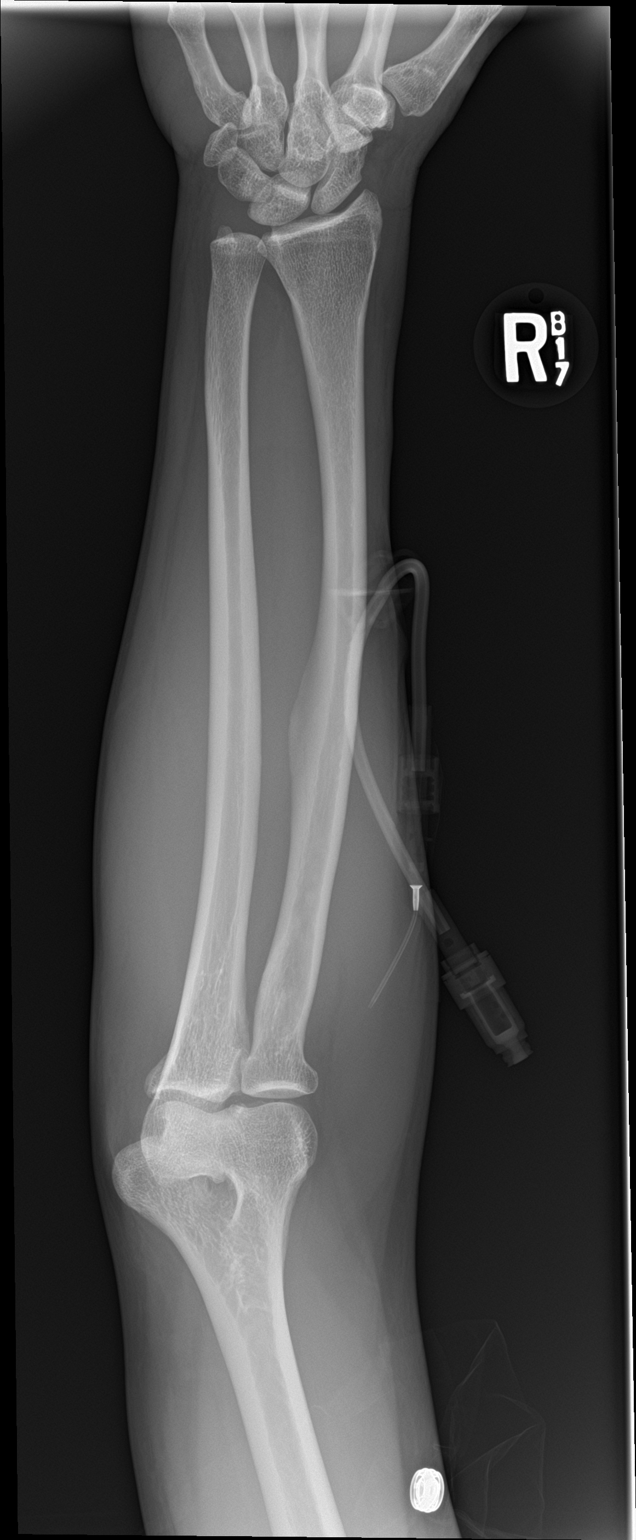

[forearm lat]
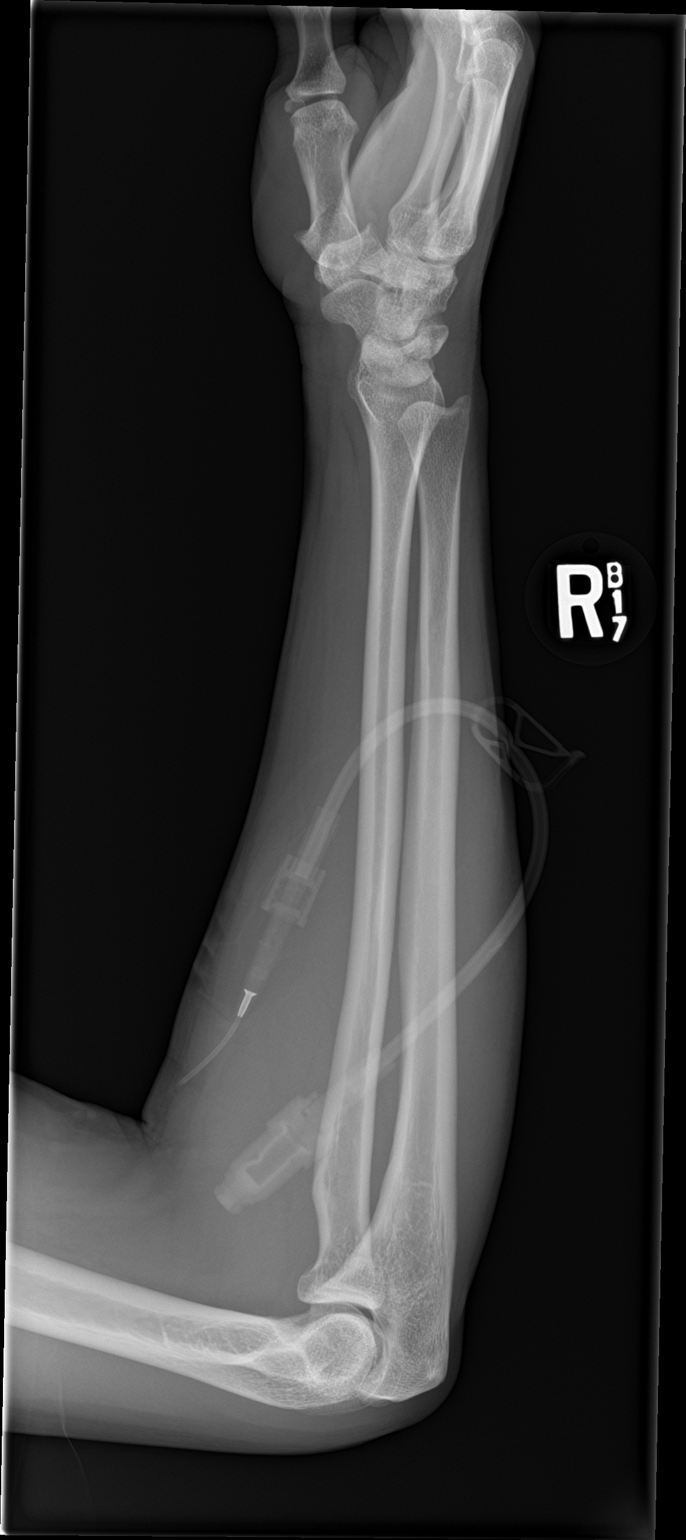

[2 of 2 positions shown; findings below may reference images not displayed]

FINDINGS: There is no evidence of fracture or other focal bone lesions. Soft
tissues demonstrate no acute finding. IV catheter in place.
IMPRESSION: No acute osseous abnormality about the right forearm.

## 2019-04-26 ENCOUNTER — Telehealth: Payer: Self-pay

## 2019-04-26 NOTE — Telephone Encounter (Signed)
This encounter was created in error - please disregard.

## 2019-04-26 NOTE — Telephone Encounter (Signed)

## 2019-04-27 ENCOUNTER — Other Ambulatory Visit: Payer: Self-pay

## 2019-04-27 ENCOUNTER — Ambulatory Visit (INDEPENDENT_AMBULATORY_CARE_PROVIDER_SITE_OTHER): Payer: Managed Care, Other (non HMO)

## 2019-04-27 DIAGNOSIS — Z954 Presence of other heart-valve replacement: Secondary | ICD-10-CM | POA: Diagnosis not present

## 2019-04-27 DIAGNOSIS — Q231 Congenital insufficiency of aortic valve: Secondary | ICD-10-CM

## 2019-04-27 DIAGNOSIS — Q230 Congenital stenosis of aortic valve: Secondary | ICD-10-CM | POA: Diagnosis not present

## 2019-04-27 DIAGNOSIS — Z7901 Long term (current) use of anticoagulants: Secondary | ICD-10-CM

## 2019-04-27 LAB — POCT INR: INR: 0.9 — AB (ref 2.0–3.0)

## 2019-04-27 NOTE — Patient Instructions (Signed)
Description   Called spoke with pt, advised to take 2 tablets today and 1.5 tablets tomorrow, then resume same dosage 1 tablet daily except 1.5 tablets on Sundays and Thursdays. Recheck INR in 1 week.  [facility] [phone] Main [phone]

## 2019-05-01 ENCOUNTER — Telehealth: Payer: Self-pay

## 2019-05-01 NOTE — Telephone Encounter (Signed)
lmom for prescreen  

## 2019-05-04 ENCOUNTER — Other Ambulatory Visit: Payer: Self-pay

## 2019-05-04 ENCOUNTER — Ambulatory Visit (INDEPENDENT_AMBULATORY_CARE_PROVIDER_SITE_OTHER): Payer: Managed Care, Other (non HMO) | Admitting: *Deleted

## 2019-05-04 DIAGNOSIS — Z954 Presence of other heart-valve replacement: Secondary | ICD-10-CM

## 2019-05-04 DIAGNOSIS — Q230 Congenital stenosis of aortic valve: Secondary | ICD-10-CM | POA: Diagnosis not present

## 2019-05-04 DIAGNOSIS — Z7901 Long term (current) use of anticoagulants: Secondary | ICD-10-CM | POA: Diagnosis not present

## 2019-05-04 DIAGNOSIS — Q231 Congenital insufficiency of aortic valve: Secondary | ICD-10-CM | POA: Diagnosis not present

## 2019-05-04 LAB — POCT INR: INR: 3.1 — AB (ref 2.0–3.0)

## 2019-05-04 NOTE — Patient Instructions (Signed)
Description   Pt stated she already took coumadin dose this morning. Continue taking1 tablet daily except 1.5 tablets on Sundays and Thursdays. Eat some green leafy vegetables. Recheck INR in 2 week.  [facility] Clinic [phone] Main [phone]

## 2019-05-07 ENCOUNTER — Other Ambulatory Visit: Payer: Self-pay | Admitting: Family Medicine

## 2019-05-07 DIAGNOSIS — F411 Generalized anxiety disorder: Secondary | ICD-10-CM

## 2019-05-09 ENCOUNTER — Other Ambulatory Visit: Payer: Self-pay | Admitting: Family Medicine

## 2019-05-11 ENCOUNTER — Telehealth: Payer: Self-pay

## 2019-05-11 NOTE — Telephone Encounter (Signed)
lmom for prescreen  

## 2019-05-17 ENCOUNTER — Other Ambulatory Visit: Payer: Self-pay

## 2019-05-18 ENCOUNTER — Ambulatory Visit (INDEPENDENT_AMBULATORY_CARE_PROVIDER_SITE_OTHER): Payer: Managed Care, Other (non HMO)

## 2019-05-18 ENCOUNTER — Ambulatory Visit (INDEPENDENT_AMBULATORY_CARE_PROVIDER_SITE_OTHER): Payer: Managed Care, Other (non HMO) | Admitting: Obstetrics and Gynecology

## 2019-05-18 ENCOUNTER — Encounter: Payer: Self-pay | Admitting: Obstetrics and Gynecology

## 2019-05-18 ENCOUNTER — Ambulatory Visit: Payer: Self-pay | Admitting: Obstetrics and Gynecology

## 2019-05-18 VITALS — BP 116/80 | HR 84 | Temp 98.0°F | Resp 14 | Ht 60.0 in | Wt 124.0 lb

## 2019-05-18 DIAGNOSIS — N6310 Unspecified lump in the right breast, unspecified quadrant: Secondary | ICD-10-CM | POA: Diagnosis not present

## 2019-05-18 DIAGNOSIS — Z954 Presence of other heart-valve replacement: Secondary | ICD-10-CM

## 2019-05-18 DIAGNOSIS — Q231 Congenital insufficiency of aortic valve: Secondary | ICD-10-CM | POA: Diagnosis not present

## 2019-05-18 DIAGNOSIS — Z7901 Long term (current) use of anticoagulants: Secondary | ICD-10-CM | POA: Diagnosis not present

## 2019-05-18 DIAGNOSIS — Q230 Congenital stenosis of aortic valve: Secondary | ICD-10-CM | POA: Diagnosis not present

## 2019-05-18 LAB — POCT INR: INR: 2.6 (ref 2.0–3.0)

## 2019-05-18 NOTE — Progress Notes (Deleted)
GYNECOLOGY  VISIT   HPI: 38 y.o.   Single  Caucasian  female   G1P0010 with No LMP recorded. Patient has had a hysterectomy.   here for     GYNECOLOGIC HISTORY: No LMP recorded. Patient has had a hysterectomy. Contraception:  Hysterectomy Menopausal hormone therapy:  n/a Last mammogram:  n/a Last pap smear:  01/12/19  Pap:  Yes, 05-17-17 Neg:Pos HR HPV;colpo revealed VAIN I, 09-18-15 LSIL:Pos HR HPV;colpo/vulvar bx 10-11-15--VAIN II and condyloma of vulva.        OB History    Gravida  1   Para      Term      Preterm      AB  1   Living  0     SAB      TAB      Ectopic      Multiple      Live Births                 Patient Active Problem List   Diagnosis Date Noted  . Hyperglycemia 12/22/2018  . Epistaxis 12/22/2018  . Cervical cancer screening 12/22/2018  . Supratherapeutic INR 12/09/2018  . Swelling in right armpit 12/09/2018  . Long term (current) use of anticoagulants [Z79.01] 11/11/2017  . 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 Chronic, continuous use of opioids 09/06/2017  . Tobacco abuse 09/27/2015  . SI (sacroiliac) joint dysfunction 02/20/2015  . Leg length discrepancy 02/20/2015  . Allergic rhinitis, cause unspecified 05/04/2014  . Aortic stenosis due to bicuspid aortic valve 02/20/2014  . Bipolar disorder ([place]) 02/05/2014  . Migraine with status migrainosus 01/27/2013  . Hyperlipidemia with target LDL less than 130 01/27/2013  . Routine general medical examination at a health care facility 10/20/2011  . Mild persistent asthma 04/21/2011  . Essential hypertension, benign 04/21/2011    Past Medical History:  Diagnosis Date  . Abnormal Pap smear of cervix    --recurrent ascus w/Pos. HR HPV  . ADD (attention deficit disorder)   . Alcohol abuse, in remission 2012  . Allergy   . Anxiety   . Aortic stenosis due to bicuspid aortic valve 02/20/2014  . Arthritis    In hips  . Asthma     rare inhaler use  . Bipolar disorder ([place])   . Headache   . Hyperlipidemia with target LDL less than 130 01/27/2013  . Hypertension   . Muscle spasms of both lower extremities    both hips  . 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 Tobacco abuse 09/27/2015  . 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 ABDOMINAL HYSTERECTOMY  02/06/2009   Robotic total laparoscopic hysterectomy  . ANTERIOR CRUCIATE LIGAMENT REPAIR  1993  . AORTIC VALVE REPLACEMENT N/A 11/03/2017   Procedure: MINIMALLY INVASIVE AORTIC VALVE REPLACEMENT( MINI THORACOTOMY);  Surgeon: Rexene Alberts, MD;  Location: [facility];  Service: Open Heart Surgery;  Laterality: N/A;  . CARDIAC CATHETERIZATION  June 2015   no CAD - moderate AS noted  . 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 COLPOSCOPY  10/2008   CIN I & II  . COLPOSCOPY  07/2000  Neg. ECC  . COLPOSCOPY  06/2001   CIN I  . COLPOSCOPY  08/2004   ECC--atypia  . COLPOSCOPY N/A 01/21/2016   Procedure: COLPOSCOPY with vaginal biopsy with CO 2 Laser of Vaginal and vulvar condyloma;  Surgeon: [name], MD;  Location: [facility];  Service: Gynecology;  Laterality: N/A;  [name] will be here 2/21 for 1115 case confirmed 01/16/15 - TS  . HERNIA REPAIR  1981/1982  . HIP SURGERY  1981   Hip Reset   . HIP SURGERY  1990   Plate was reconstructed/ took out growth plate in Right knee  . HIP SURGERY  1992   Plate removed in Left hip  . LEFT AND RIGHT HEART CATHETERIZATION WITH CORONARY ANGIOGRAM N/A 05/18/2014   Procedure: LEFT AND RIGHT HEART CATHETERIZATION WITH CORONARY ANGIOGRAM;  Surgeon: Jettie Booze, MD;  Location: [facility] CATH LAB;  Service: Cardiovascular;   Laterality: N/A;  . LYMPH NODE BIOPSY  1995  . NASAL SEPTUM SURGERY  2002  . TEE WITHOUT CARDIOVERSION N/A 11/03/2017   Procedure: TRANSESOPHAGEAL ECHOCARDIOGRAM (TEE);  Surgeon: Rexene Alberts, MD;  Location: [facility];  Service: Open Heart Surgery;  Laterality: N/A;  . TONSILLECTOMY AND ADENOIDECTOMY  1990  . TYMPANOSTOMY TUBE PLACEMENT  1981/1982    Current Outpatient Medications  Medication Sig Dispense Refill  . albuterol (PROVENTIL HFA;VENTOLIN HFA) 108 (90 Base) MCG/ACT inhaler Inhale 2 puffs into the lungs every 6 (six) hours as needed for wheezing or shortness of breath. 1 Inhaler 3  . busPIRone (BUSPAR) 30 MG tablet TAKE ONE TABLET BY MOUTH TWICE A DAY 60 tablet 0  . doxycycline (VIBRA-TABS) 100 MG tablet Take 1 tablet (100 mg total) by mouth 2 (two) times daily. 20 tablet 0  . DULoxetine (CYMBALTA) 20 MG capsule TAKE ONE CAPSULE BY MOUTH DAILY 30 capsule 0  . gabapentin (NEURONTIN) 300 MG capsule TAKE TWO CAPSULES BY MOUTH THREE TIMES A DAY 360 capsule 0  . lamoTRIgine (LAMICTAL) 200 MG tablet TAKE TWO TABLETS BY MOUTH DAILY 180 tablet 0  . lansoprazole (PREVACID) 15 MG capsule Take 15 mg by mouth daily as needed (for heartburn or acid reflux).     . metoprolol tartrate (LOPRESSOR) 25 MG tablet Take 0.5 tablets (12.5 mg total) by mouth 2 (two) times daily. (Patient taking differently: Take 12.5 mg by mouth daily. ) 90 tablet 3  . nicotine (NICODERM CQ - DOSED IN MG/24 HOURS) 21 mg/24hr patch Place 1 patch (21 mg total) onto the skin daily. 28 patch 0  . promethazine-dextromethorphan (PROMETHAZINE-DM) 6.25-15 MG/5ML syrup Take 5 mLs by mouth 4 (four) times daily as needed for cough. 118 mL 0  . traMADol (ULTRAM) 50 MG tablet TAKE TWO TABLETS BY MOUTH TWICE A DAY 120 tablet 0  . warfarin (COUMADIN) 7.5 MG tablet TAKE 1 TABLET BY MOUTH DAILY , EXCEPT 1.5 TABLETS ON SUNDAY, TUESDAY, THURSDAY OR TAKE AS DIRECTED BY COUMADIN CLINIC 40 tablet 3   No current facility-administered medications  for this visit.      ALLERGIES: Demerol, Ibuprofen, and Codeine  Family History  Problem Relation Age of Onset  . Hyperlipidemia Mother   . Hypertension Mother   . Thyroid disease Mother   . Hyperlipidemia Father   . Hypertension Father   . Migraines Father   . Hypertension Brother   . Hyperlipidemia Brother   . Thyroid disease Maternal Grandmother   . Thyroid disease Maternal Grandfather   . Cancer Neg Hx  Social History   Socioeconomic History  . Marital status: Single    Spouse name: Not on file  . Number of children: Not on file  . Years of education: Not on file  . Highest education level: Not on file  Occupational History  . Not on file  Social Needs  . Financial resource strain: Not hard at all  . Food insecurity    Worry: Patient refused    Inability: Patient refused  . Transportation needs    Medical: Patient refused    Non-medical: Patient refused  Tobacco Use  . Smoking status: Former Smoker    Packs/day: 0.50    Years: 12.00    Pack years: 6.00    Types: Cigarettes    Quit date: 11/02/2017    Years since quitting: 1.5  . Smokeless tobacco: Never Used  . Tobacco comment: pt given fake cigarette for hand/mouth association. pt using [facility] for support  Substance and Sexual Activity  . Alcohol use: No    Alcohol/week: 0.0 standard drinks  . Drug use: No  . Sexual activity: Yes    Partners: Male    Birth control/protection: Surgical    Comment: Hysterectomy  Lifestyle  . Physical activity    Days per week: Patient refused    Minutes per session: Patient refused  . Stress: Not on file  Relationships  . Social Herbalist on phone: Patient refused    Gets together: Patient refused    Attends religious service: Patient refused    Active member of club or organization: Patient refused    Attends meetings of clubs or organizations: Patient refused    Relationship status: Patient refused  . Intimate partner violence    Fear  of current or ex partner: Patient refused    Emotionally abused: Patient refused    Physically abused: Patient refused    Forced sexual activity: Patient refused  Other Topics Concern  . Not on file  Social History Narrative  . Not on file    Review of Systems  PHYSICAL EXAMINATION:    There were no vitals taken for this visit.    General appearance: alert, cooperative and appears stated age Head: Normocephalic, without obvious abnormality, atraumatic Neck: no adenopathy, supple, symmetrical, trachea midline and thyroid normal to inspection and palpation Lungs: clear to auscultation bilaterally Breasts: normal appearance, no masses or tenderness, No nipple retraction or dimpling, No nipple discharge or bleeding, No axillary or supraclavicular adenopathy Heart: regular rate and rhythm Abdomen: soft, non-tender, no masses,  no organomegaly Extremities: extremities normal, atraumatic, no cyanosis or edema Skin: Skin color, texture, turgor normal. No rashes or lesions Lymph nodes: Cervical, supraclavicular, and axillary nodes normal. No abnormal inguinal nodes palpated Neurologic: Grossly normal  Pelvic: External genitalia:  no lesions              Urethra:  normal appearing urethra with no masses, tenderness or lesions              Bartholins and Skenes: normal                 Vagina: normal appearing vagina with normal color and discharge, no lesions              Cervix: no lesions                Bimanual Exam:  Uterus:  normal size, contour, position, consistency, mobility, non-tender  Adnexa: no mass, fullness, tenderness              Rectal exam: {yes no:[id number]}.  Confirms.              Anus:  normal sphincter tone, no lesions  Chaperone was present for exam.  ASSESSMENT     PLAN     An After Visit Summary was printed and given to the patient.  ______ minutes face to face time of which over 50% was spent in counseling.

## 2019-05-18 NOTE — Progress Notes (Signed)
GYNECOLOGY  VISIT   HPI: 38 y.o.   Single  Caucasian  female   G1P0010 with No LMP recorded. Patient has had a hysterectomy.   here for breast recheck.  She was noted to have a right breast lump at her annual exam on 01/12/19.  She had a dx mammogram and right breast US which were normal. She does not feel a lump.  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   GYNECOLOGIC HISTORY: No LMP recorded. Patient has had a hysterectomy. Contraception:  Hysterectomy Menopausal hormone therapy:  none Last mammogram:  02/23/19 - Dx mammogram and right breast US.  BI-RADS 1.   Last pap smear:   01/12/19 Neg:Pos HR HPV.  Neg 16/18/45.        OB History    Gravida  1   Para      Term      Preterm      AB  1   Living  0     SAB      TAB      Ectopic      Multiple      Live Births                 Patient Active Problem List   Diagnosis Date Noted  . Hyperglycemia 12/22/2018  . Epistaxis 12/22/2018  . Cervical cancer screening 12/22/2018  . Supratherapeutic INR 12/09/2018  . Swelling in right armpit 12/09/2018  . Long term (current) use of anticoagulants [Z79.01] 11/11/2017  . 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 Chronic, continuous use of opioids 09/06/2017  . Tobacco abuse 09/27/2015  . SI (sacroiliac) joint dysfunction 02/20/2015  . Leg length discrepancy 02/20/2015  . Allergic rhinitis, cause unspecified 05/04/2014  . Aortic stenosis due to bicuspid aortic valve 02/20/2014  . Bipolar disorder ([place]) 02/05/2014  . Migraine with status migrainosus 01/27/2013  . Hyperlipidemia with target LDL less than 130 01/27/2013  . Routine general medical examination at a health care facility 10/20/2011  . Mild persistent asthma 04/21/2011  . Essential hypertension, benign 04/21/2011    Past Medical History:  Diagnosis Date  . Abnormal Pap smear of cervix    --recurrent ascus w/Pos.  HR HPV  . ADD (attention deficit disorder)   . Alcohol abuse, in remission 2012  . Allergy   . Anxiety   . Aortic stenosis due to bicuspid aortic valve 02/20/2014  . Arthritis    In hips  . Asthma    rare inhaler use  . Bipolar disorder ([place])   . Headache   . Hyperlipidemia with target LDL less than 130 01/27/2013  . Hypertension   . Muscle spasms of both lower extremities    both hips  . 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 Tobacco abuse 09/27/2015  . 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 ABDOMINAL HYSTERECTOMY  02/06/2009   Robotic total laparoscopic hysterectomy  . ANTERIOR CRUCIATE LIGAMENT REPAIR  1993  . AORTIC VALVE REPLACEMENT N/A 11/03/2017   Procedure: MINIMALLY INVASIVE AORTIC VALVE REPLACEMENT( MINI THORACOTOMY);  Surgeon: Rexene Alberts, MD;  Location: [facility];  Service: Open Heart Surgery;  Laterality: N/A;  .  CARDIAC CATHETERIZATION  June 2015   no CAD - moderate AS noted  . 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 COLPOSCOPY  10/2008   CIN I & II  . COLPOSCOPY  07/2000   Neg. ECC  . COLPOSCOPY  06/2001   CIN I  . COLPOSCOPY  08/2004   ECC--atypia  . COLPOSCOPY N/A 01/21/2016   Procedure: COLPOSCOPY with vaginal biopsy with CO 2 Laser of Vaginal and vulvar condyloma;  Surgeon: [name], MD;  Location: [facility];  Service: Gynecology;  Laterality: N/A;  [name] will be here 2/21 for 1115 case confirmed 01/16/15 - TS  . HERNIA REPAIR  1981/1982  . HIP SURGERY  1981   Hip Reset   . HIP SURGERY  1990   Plate was reconstructed/ took out growth plate in Right knee  . HIP SURGERY  1992   Plate removed in Left hip  . LEFT AND RIGHT HEART  CATHETERIZATION WITH CORONARY ANGIOGRAM N/A 05/18/2014   Procedure: LEFT AND RIGHT HEART CATHETERIZATION WITH CORONARY ANGIOGRAM;  Surgeon: Jettie Booze, MD;  Location: [facility] CATH LAB;  Service: Cardiovascular;  Laterality: N/A;  . LYMPH NODE BIOPSY  1995  . NASAL SEPTUM SURGERY  2002  . TEE WITHOUT CARDIOVERSION N/A 11/03/2017   Procedure: TRANSESOPHAGEAL ECHOCARDIOGRAM (TEE);  Surgeon: Rexene Alberts, MD;  Location: [facility];  Service: Open Heart Surgery;  Laterality: N/A;  . TONSILLECTOMY AND ADENOIDECTOMY  1990  . TYMPANOSTOMY TUBE PLACEMENT  1981/1982    Current Outpatient Medications  Medication Sig Dispense Refill  . albuterol (PROVENTIL HFA;VENTOLIN HFA) 108 (90 Base) MCG/ACT inhaler Inhale 2 puffs into the lungs every 6 (six) hours as needed for wheezing or shortness of breath. 1 Inhaler 3  . busPIRone (BUSPAR) 30 MG tablet TAKE ONE TABLET BY MOUTH TWICE A DAY 60 tablet 0  . DULoxetine (CYMBALTA) 20 MG capsule TAKE ONE CAPSULE BY MOUTH DAILY 30 capsule 0  . gabapentin (NEURONTIN) 300 MG capsule TAKE TWO CAPSULES BY MOUTH THREE TIMES A DAY 360 capsule 0  . lamoTRIgine (LAMICTAL) 200 MG tablet TAKE TWO TABLETS BY MOUTH DAILY 180 tablet 0  . lansoprazole (PREVACID) 15 MG capsule Take 15 mg by mouth daily as needed (for heartburn or acid reflux).     . metoprolol tartrate (LOPRESSOR) 25 MG tablet Take 0.5 tablets (12.5 mg total) by mouth 2 (two) times daily. (Patient taking differently: Take 12.5 mg by mouth daily. ) 90 tablet 3  . nicotine (NICODERM CQ - DOSED IN MG/24 HOURS) 21 mg/24hr patch Place 1 patch (21 mg total) onto the skin daily. 28 patch 0  . traMADol (ULTRAM) 50 MG tablet TAKE TWO TABLETS BY MOUTH TWICE A DAY 120 tablet 0  . warfarin (COUMADIN) 7.5 MG tablet TAKE 1 TABLET BY MOUTH DAILY , EXCEPT 1.5 TABLETS ON SUNDAY, TUESDAY, THURSDAY OR TAKE AS DIRECTED BY COUMADIN CLINIC 40 tablet 3   No current facility-administered medications for this visit.      ALLERGIES:  Demerol, Ibuprofen, and Codeine  Family History  Problem Relation Age of Onset  . Hyperlipidemia Mother   . Hypertension Mother   . Thyroid disease Mother   . Hyperlipidemia Father   . Hypertension Father   . Migraines Father   . Hypertension Brother   . Hyperlipidemia Brother   . Thyroid disease Maternal Grandmother   . Thyroid disease Maternal Grandfather   . Cancer Neg Hx  Social History   Socioeconomic History  . Marital status: Single    Spouse name: Not on file  . Number of children: Not on file  . Years of education: Not on file  . Highest education level: Not on file  Occupational History  . Not on file  Social Needs  . Financial resource strain: Not hard at all  . Food insecurity    Worry: Patient refused    Inability: Patient refused  . Transportation needs    Medical: Patient refused    Non-medical: Patient refused  Tobacco Use  . Smoking status: Former Smoker    Packs/day: 0.50    Years: 12.00    Pack years: 6.00    Types: Cigarettes    Quit date: 11/02/2017    Years since quitting: 1.5  . Smokeless tobacco: Never Used  . Tobacco comment: pt given fake cigarette for hand/mouth association. pt using [facility] for support  Substance and Sexual Activity  . Alcohol use: No    Alcohol/week: 0.0 standard drinks  . Drug use: No  . Sexual activity: Yes    Partners: Male    Birth control/protection: Surgical    Comment: Hysterectomy  Lifestyle  . Physical activity    Days per week: Patient refused    Minutes per session: Patient refused  . Stress: Not on file  Relationships  . Social Herbalist on phone: Patient refused    Gets together: Patient refused    Attends religious service: Patient refused    Active member of club or organization: Patient refused    Attends meetings of clubs or organizations: Patient refused    Relationship status: Patient refused  . Intimate partner violence    Fear of current or ex partner: Patient  refused    Emotionally abused: Patient refused    Physically abused: Patient refused    Forced sexual activity: Patient refused  Other Topics Concern  . Not on file  Social History Narrative  . Not on file    Review of Systems  Constitutional: Negative.   HENT: Negative.   Eyes: Negative.   Respiratory: Negative.   Cardiovascular: Negative.   Gastrointestinal: Negative.   Endocrine: Negative.   Genitourinary: Negative.   Musculoskeletal: Negative.   Skin: Negative.   Allergic/Immunologic: Negative.   Neurological: Negative.   Hematological: Negative.   Psychiatric/Behavioral: Negative.     PHYSICAL EXAMINATION:    BP 116/80 (BP Location: Left Arm, Patient Position: Sitting, Cuff Size: Normal)   Pulse 84   Temp 98 F (36.7 C) (Temporal)   Resp 14   Ht 5' (1.524 m)   Wt 124 lb (56.2 kg)   BMI 24.22 kg/m     General appearance: alert, cooperative and appears stated age   Breasts: normal appearance, no masses or tenderness, No nipple retraction or dimpling, No nipple discharge or bleeding, No axillary or supraclavicular adenopathy  Chaperone was present for exam.  ASSESSMENT  Right breast lump resolved.  Small condyloma of perineum.  On coumadin.  PLAN  Reassurance regarding normal examination and imaging.  We discussed [place], cryo, acetic acid, and removal of condyloma.  We declines and will just have a recheck in Feb. 2021 at her next annual exam.  FU for annual exam and prn.    An After Visit Summary was printed and given to the patient.  __15____ minutes face to face time of which over 50% was spent in counseling.

## 2019-05-18 NOTE — Patient Instructions (Signed)
Description   Continue on same dosage 1 tablet daily except 1.5 tablets on Sundays and Thursdays.  Recheck INR in 3 weeks.  [facility] [phone] Main [phone]

## 2019-05-19 ENCOUNTER — Other Ambulatory Visit: Payer: Self-pay | Admitting: Cardiology

## 2019-06-01 ENCOUNTER — Telehealth: Payer: Self-pay

## 2019-06-01 NOTE — Telephone Encounter (Signed)

## 2019-06-04 ENCOUNTER — Other Ambulatory Visit: Payer: Self-pay | Admitting: Family Medicine

## 2019-06-04 DIAGNOSIS — F411 Generalized anxiety disorder: Secondary | ICD-10-CM

## 2019-06-07 ENCOUNTER — Other Ambulatory Visit: Payer: Self-pay | Admitting: Family Medicine

## 2019-06-08 ENCOUNTER — Ambulatory Visit (INDEPENDENT_AMBULATORY_CARE_PROVIDER_SITE_OTHER): Payer: Managed Care, Other (non HMO) | Admitting: *Deleted

## 2019-06-08 ENCOUNTER — Other Ambulatory Visit: Payer: Self-pay

## 2019-06-08 DIAGNOSIS — Z954 Presence of other heart-valve replacement: Secondary | ICD-10-CM

## 2019-06-08 DIAGNOSIS — Z7901 Long term (current) use of anticoagulants: Secondary | ICD-10-CM | POA: Diagnosis not present

## 2019-06-08 DIAGNOSIS — Q230 Congenital stenosis of aortic valve: Secondary | ICD-10-CM

## 2019-06-08 DIAGNOSIS — Q231 Congenital insufficiency of aortic valve: Secondary | ICD-10-CM

## 2019-06-08 LAB — POCT INR: INR: 2.9 (ref 2.0–3.0)

## 2019-06-08 NOTE — Patient Instructions (Signed)
Description   Continue on same dosage 1 tablet daily except 1.5 tablets on Sundays and Thursdays.  Recheck INR in 4 weeks.  [facility] [phone] Main [phone]

## 2019-06-21 ENCOUNTER — Other Ambulatory Visit: Payer: Self-pay | Admitting: Family Medicine

## 2019-06-26 ENCOUNTER — Other Ambulatory Visit: Payer: Self-pay | Admitting: *Deleted

## 2019-06-26 ENCOUNTER — Other Ambulatory Visit: Payer: Self-pay | Admitting: Cardiology

## 2019-06-26 MED ORDER — WARFARIN SODIUM 7.5 MG PO TABS: ORAL_TABLET | ORAL | 3 refills | Status: DC

## 2019-07-04 ENCOUNTER — Other Ambulatory Visit: Payer: Self-pay | Admitting: Family Medicine

## 2019-07-04 DIAGNOSIS — F411 Generalized anxiety disorder: Secondary | ICD-10-CM

## 2019-07-06 ENCOUNTER — Ambulatory Visit (INDEPENDENT_AMBULATORY_CARE_PROVIDER_SITE_OTHER): Payer: Managed Care, Other (non HMO) | Admitting: *Deleted

## 2019-07-06 ENCOUNTER — Other Ambulatory Visit: Payer: Self-pay

## 2019-07-06 DIAGNOSIS — Q230 Congenital stenosis of aortic valve: Secondary | ICD-10-CM

## 2019-07-06 DIAGNOSIS — Z7901 Long term (current) use of anticoagulants: Secondary | ICD-10-CM | POA: Diagnosis not present

## 2019-07-06 DIAGNOSIS — Z954 Presence of other heart-valve replacement: Secondary | ICD-10-CM | POA: Diagnosis not present

## 2019-07-06 DIAGNOSIS — Q231 Congenital insufficiency of aortic valve: Secondary | ICD-10-CM | POA: Diagnosis not present

## 2019-07-06 LAB — POCT INR: INR: 1.9 — AB (ref 2.0–3.0)

## 2019-07-06 NOTE — Patient Instructions (Signed)
Description   Today take 2 tablets then continue on same dosage 1 tablet daily except 1.5 tablets on Sundays and Thursdays.  Recheck INR in 4 weeks.  [facility] [phone] Main [phone]

## 2019-07-10 ENCOUNTER — Other Ambulatory Visit: Payer: Self-pay | Admitting: Family Medicine

## 2019-07-13 NOTE — Telephone Encounter (Signed)
Spoke with patient that Dr. Tamala Julian is out office this week and is unable to refill prescription until Monday. Patient voices understanding.

## 2019-07-13 NOTE — Telephone Encounter (Signed)
Pt called about status of refill requested by pharmacy for traMADol (ULTRAM) 50 MG tablet  Pt has been without medication for 4 days. Please advise

## 2019-07-18 ENCOUNTER — Other Ambulatory Visit: Payer: Self-pay | Admitting: Cardiology

## 2019-07-27 ENCOUNTER — Other Ambulatory Visit: Payer: Self-pay | Admitting: Internal Medicine

## 2019-07-27 DIAGNOSIS — F411 Generalized anxiety disorder: Secondary | ICD-10-CM

## 2019-07-27 DIAGNOSIS — F3177 Bipolar disorder, in partial remission, most recent episode mixed: Secondary | ICD-10-CM

## 2019-08-02 ENCOUNTER — Other Ambulatory Visit: Payer: Self-pay | Admitting: Family Medicine

## 2019-08-02 DIAGNOSIS — F411 Generalized anxiety disorder: Secondary | ICD-10-CM

## 2019-08-03 ENCOUNTER — Other Ambulatory Visit: Payer: Self-pay

## 2019-08-03 ENCOUNTER — Ambulatory Visit (INDEPENDENT_AMBULATORY_CARE_PROVIDER_SITE_OTHER): Payer: Managed Care, Other (non HMO) | Admitting: *Deleted

## 2019-08-03 DIAGNOSIS — Q231 Congenital insufficiency of aortic valve: Secondary | ICD-10-CM | POA: Diagnosis not present

## 2019-08-03 DIAGNOSIS — Q230 Congenital stenosis of aortic valve: Secondary | ICD-10-CM

## 2019-08-03 DIAGNOSIS — Z7901 Long term (current) use of anticoagulants: Secondary | ICD-10-CM

## 2019-08-03 DIAGNOSIS — Z954 Presence of other heart-valve replacement: Secondary | ICD-10-CM | POA: Diagnosis not present

## 2019-08-03 LAB — POCT INR: INR: 2.3 (ref 2.0–3.0)

## 2019-08-03 NOTE — Patient Instructions (Signed)
Description   Continue on same dosage 1 tablet daily except 1.5 tablets on Sundays and Thursdays.  Recheck INR in 5 weeks.  [facility] [phone] Main [phone]

## 2019-08-14 ENCOUNTER — Other Ambulatory Visit: Payer: Self-pay | Admitting: Family Medicine

## 2019-08-14 DIAGNOSIS — F411 Generalized anxiety disorder: Secondary | ICD-10-CM

## 2019-08-16 ENCOUNTER — Other Ambulatory Visit: Payer: Self-pay | Admitting: Family Medicine

## 2019-08-28 ENCOUNTER — Other Ambulatory Visit: Payer: Self-pay | Admitting: Family Medicine

## 2019-09-07 ENCOUNTER — Encounter (INDEPENDENT_AMBULATORY_CARE_PROVIDER_SITE_OTHER): Payer: Self-pay

## 2019-09-07 ENCOUNTER — Ambulatory Visit (INDEPENDENT_AMBULATORY_CARE_PROVIDER_SITE_OTHER): Payer: Managed Care, Other (non HMO) | Admitting: *Deleted

## 2019-09-07 ENCOUNTER — Other Ambulatory Visit: Payer: Self-pay

## 2019-09-07 DIAGNOSIS — Z954 Presence of other heart-valve replacement: Secondary | ICD-10-CM

## 2019-09-07 DIAGNOSIS — Q231 Congenital insufficiency of aortic valve: Secondary | ICD-10-CM | POA: Diagnosis not present

## 2019-09-07 DIAGNOSIS — Z7901 Long term (current) use of anticoagulants: Secondary | ICD-10-CM | POA: Diagnosis not present

## 2019-09-07 DIAGNOSIS — Q230 Congenital stenosis of aortic valve: Secondary | ICD-10-CM | POA: Diagnosis not present

## 2019-09-07 LAB — POCT INR: INR: 2.2 (ref 2.0–3.0)

## 2019-09-07 NOTE — Patient Instructions (Signed)
Description   Continue on same dosage 1 tablet daily except 1.5 tablets on Sundays and Thursdays.  Recheck INR in 6 weeks.  [facility] #[phone] Main #[phone]    

## 2019-09-08 ENCOUNTER — Other Ambulatory Visit: Payer: Self-pay | Admitting: Family Medicine

## 2019-09-12 NOTE — Telephone Encounter (Signed)
Pt left msg requesting a refill for tramadol.

## 2019-09-25 ENCOUNTER — Other Ambulatory Visit: Payer: Self-pay | Admitting: Family Medicine

## 2019-10-05 ENCOUNTER — Encounter: Payer: Self-pay | Admitting: Internal Medicine

## 2019-10-05 ENCOUNTER — Other Ambulatory Visit (INDEPENDENT_AMBULATORY_CARE_PROVIDER_SITE_OTHER): Payer: Managed Care, Other (non HMO)

## 2019-10-05 ENCOUNTER — Ambulatory Visit (INDEPENDENT_AMBULATORY_CARE_PROVIDER_SITE_OTHER): Payer: Managed Care, Other (non HMO) | Admitting: Internal Medicine

## 2019-10-05 ENCOUNTER — Other Ambulatory Visit: Payer: Managed Care, Other (non HMO)

## 2019-10-05 ENCOUNTER — Other Ambulatory Visit: Payer: Self-pay

## 2019-10-05 VITALS — BP 140/78 | HR 72 | Temp 98.0°F | Resp 16 | Ht 60.0 in | Wt 125.0 lb

## 2019-10-05 DIAGNOSIS — Z7901 Long term (current) use of anticoagulants: Secondary | ICD-10-CM | POA: Diagnosis not present

## 2019-10-05 DIAGNOSIS — I10 Essential (primary) hypertension: Secondary | ICD-10-CM | POA: Diagnosis not present

## 2019-10-05 DIAGNOSIS — D696 Thrombocytopenia, unspecified: Secondary | ICD-10-CM

## 2019-10-05 LAB — CBC WITH DIFFERENTIAL/PLATELET
Basophils Absolute: 0 10*3/uL (ref 0.0–0.1)
Basophils Relative: 0.3 % (ref 0.0–3.0)
Eosinophils Absolute: 0.2 10*3/uL (ref 0.0–0.7)
Eosinophils Relative: 3.7 % (ref 0.0–5.0)
HCT: 42.3 % (ref 36.0–46.0)
Hemoglobin: 14.2 g/dL (ref 12.0–15.0)
Lymphocytes Relative: 40.6 % (ref 12.0–46.0)
Lymphs Abs: 1.9 10*3/uL (ref 0.7–4.0)
MCHC: 33.6 g/dL (ref 30.0–36.0)
MCV: 92.9 fl (ref 78.0–100.0)
Monocytes Absolute: 0.4 10*3/uL (ref 0.1–1.0)
Monocytes Relative: 8 % (ref 3.0–12.0)
Neutro Abs: 2.2 10*3/uL (ref 1.4–7.7)
Neutrophils Relative %: 47.4 % (ref 43.0–77.0)
Platelets: 144 10*3/uL — ABNORMAL LOW (ref 150.0–400.0)
RBC: 4.55 Mil/uL (ref 3.87–5.11)
RDW: 14 % (ref 11.5–15.5)
WBC: 4.6 10*3/uL (ref 4.0–10.5)

## 2019-10-05 LAB — BASIC METABOLIC PANEL
BUN: 7 mg/dL (ref 6–23)
CO2: 31 mEq/L (ref 19–32)
Calcium: 9.1 mg/dL (ref 8.4–10.5)
Chloride: 105 mEq/L (ref 96–112)
Creatinine, Ser: 0.94 mg/dL (ref 0.40–1.20)
GFR: 66.16 mL/min (ref >60.00)
Glucose, Bld: 94 mg/dL (ref 70–99)
Potassium: 4.5 mEq/L (ref 3.5–5.1)
Sodium: 140 mEq/L (ref 135–145)

## 2019-10-05 LAB — PROTIME-INR
INR: 2.9 ratio — ABNORMAL HIGH (ref 0.8–1.0)
Prothrombin Time: 33 s — ABNORMAL HIGH (ref 9.6–13.1)

## 2019-10-05 NOTE — Progress Notes (Signed)
Subjective:  Patient ID: Danielle Obrien, female    DOB: [birth year]  Age: 38 y.o. MRN: [id number]  CC: Hypertension   HPI Danielle Obrien presents for f/up - She developed a bruise on her left lower extremity a few days ago.  It does not bother her but she is concerned about the appearance.  She does not recall any trauma or injury.  She has no bruising elsewhere and she denies any recent episodes of bleeding.  Outpatient Medications Prior to Visit  Medication Sig Dispense Refill   albuterol (PROVENTIL HFA;VENTOLIN HFA) 108 (90 Base) MCG/ACT inhaler Inhale 2 puffs into the lungs every 6 (six) hours as needed for wheezing or shortness of breath. 1 Inhaler 3   busPIRone (BUSPAR) 30 MG tablet TAKE ONE TABLET BY MOUTH TWICE A DAY 60 tablet 0   DULoxetine (CYMBALTA) 20 MG capsule TAKE ONE CAPSULE BY MOUTH DAILY 30 capsule 0   gabapentin (NEURONTIN) 300 MG capsule TAKE TWO CAPSULES BY MOUTH THREE TIMES A DAY 360 capsule 0   lamoTRIgine (LAMICTAL) 200 MG tablet TAKE TWO TABLETS BY MOUTH DAILY 180 tablet 0   lansoprazole (PREVACID) 15 MG capsule Take 15 mg by mouth daily as needed (for heartburn or acid reflux).      metoprolol tartrate (LOPRESSOR) 25 MG tablet Take 0.5 tablets (12.5 mg total) by mouth 2 (two) times daily. Pt must make appt with provider to get further refills - 2nd attempt 15 tablet 0   traMADol (ULTRAM) 50 MG tablet TAKE TWO TABLETS BY MOUTH TWICE A DAY 120 tablet 0   warfarin (COUMADIN) 7.5 MG tablet TAKE 1 TABLET BY MOUTH DAILY , EXCEPT 1.5 TABLETS ON SUNDAY, TUESDAY, THURSDAY OR TAKE AS DIRECTED BY COUMADIN CLINIC 40 tablet 3   nicotine (NICODERM CQ - DOSED IN MG/24 HOURS) 21 mg/24hr patch Place 1 patch (21 mg total) onto the skin daily. 28 patch 0   No facility-administered medications prior to visit.     ROS Review of Systems  Constitutional: Negative for diaphoresis and fatigue.  HENT: Negative.  Negative for nosebleeds.   Eyes: Negative.     Respiratory: Negative for cough, chest tightness, shortness of breath and wheezing.   Cardiovascular: Negative for chest pain, palpitations and leg swelling.  Gastrointestinal: Negative for abdominal pain, anal bleeding, blood in stool, diarrhea, nausea and vomiting.  Endocrine: Negative.   Genitourinary: Negative.  Negative for decreased urine volume, difficulty urinating, hematuria and urgency.  Musculoskeletal: Negative for arthralgias and myalgias.  Skin: Negative for color change, pallor and rash.  Neurological: Negative.  Negative for dizziness, weakness and light-headedness.  Hematological: Negative for adenopathy. Bruises/bleeds easily.  Psychiatric/Behavioral: Negative.     Objective:  BP 140/78 (BP Location: Left Arm, Patient Position: Sitting, Cuff Size: Normal)    Pulse 72    Temp 98 F (36.7 C) (Oral)    Resp 16    Ht 5' (1.524 m)    Wt 125 lb (56.7 kg)    SpO2 97%    BMI 24.41 kg/m   BP Readings from Last 3 Encounters:  10/05/19 140/78  05/18/19 116/80  01/26/19 128/82    Wt Readings from Last 3 Encounters:  10/05/19 125 lb (56.7 kg)  05/18/19 124 lb (56.2 kg)  01/26/19 118 lb (53.5 kg)    Physical Exam Vitals signs reviewed.  Constitutional:      Appearance: Normal appearance.  HENT:     Nose: Nose normal.  Eyes:     General:  No scleral icterus.    Conjunctiva/sclera: Conjunctivae normal.  Neck:     Musculoskeletal: Neck supple. No muscular tenderness.  Cardiovascular:     Rate and Rhythm: Normal rate.     Heart sounds: Murmur present. Systolic murmur present with a grade of 2/6. No diastolic murmur.  Pulmonary:     Effort: Pulmonary effort is normal.     Breath sounds: No stridor. No wheezing, rhonchi or rales.  Abdominal:     General: Abdomen is flat. Bowel sounds are normal.     Palpations: Abdomen is soft.     Tenderness: There is no abdominal tenderness.  Musculoskeletal:     Right lower leg: No edema.     Left lower leg: No edema.        Legs:  Lymphadenopathy:     Cervical: No cervical adenopathy.  Skin:    General: Skin is warm and dry.  Neurological:     General: No focal deficit present.     Mental Status: She is alert.  Psychiatric:        Mood and Affect: Mood normal.        Behavior: Behavior normal.     Lab Results  Component Value Date   WBC 4.6 10/05/2019   HGB 14.2 10/05/2019   HCT 42.3 10/05/2019   PLT 144.0 (L) 10/05/2019   GLUCOSE 94 10/05/2019   CHOL 256 (H) 12/22/2018   TRIG 84.0 12/22/2018   HDL 74.70 12/22/2018   LDLDIRECT 171.0 01/27/2013   LDLCALC 165 (H) 12/22/2018   ALT 25 11/01/2017   AST 24 11/01/2017   NA 140 10/05/2019   K 4.5 10/05/2019   CL 105 10/05/2019   CREATININE 0.94 10/05/2019   BUN 7 10/05/2019   CO2 31 10/05/2019   TSH 0.64 01/14/2016   INR 2.9 (H) 10/05/2019   HGBA1C 5.3 12/22/2018    US Breast Ltd Uni Right Inc Axilla  Result Date: 02/28/2019 CLINICAL DATA:  38 year old patient presents for baseline mammogram. Palpable area felt in the medial right breast on recent clinical physical examination. 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 EXAM: DIGITAL DIAGNOSTIC BILATERAL MAMMOGRAM WITH CAD AND TOMO ULTRASOUND RIGHT BREAST COMPARISON:  None ACR Breast Density Category c: The breast tissue is heterogeneously dense, which may obscure small masses. FINDINGS: No mass, architectural distortion, or suspicious microcalcification is identified to suggest malignancy in either breast. Spot tangential view of the region of clinical concern in the medial right breast is negative. Mammographic images were processed with CAD. On physical exam, there is a thick cutaneous scar in the upper inner right breast 2 o'clock region from prior aortic valve surgery. Inferior to this in the 3 to 4 o'clock region I do palpate ribs. I do not palpate a breast mass. Targeted ultrasound is performed, showing normal fibroglandular tissue in  the 3-4 o'clock region of the left breast. No solid or cystic mass. No abnormal shadowing. IMPRESSION: No evidence of malignancy in either breast. RECOMMENDATION: Screening mammogram at age 15 unless there are persistent or intervening clinical concerns. (Code:SM-B-40A) I have discussed the findings and recommendations with the patient. Results were also provided in writing at the conclusion of the visit. If applicable, a reminder letter will be sent to the patient regarding the next appointment. BI-RADS CATEGORY  1: Negative. Electronically Signed   By: Curlene Dolphin M.D.   On: 02/23/2019 08:46   Mm Diag Breast Tomo Bilateral  Addendum Date: 03/21/2019  ADDENDUM REPORT: 03/21/2019 08:46 ADDENDUM: This addendum is created to correct a left/right error in the findings section of the report. In the ultrasound section, findings should read as follows: Targeted ultrasound is performed, showing normal fibroglandular tissue in the 3-4 o'clock region of the RIGHT breast. No solid or cystic mass. No abnormal shadowing. Electronically Signed   By: Curlene Dolphin M.D.   On: 03/21/2019 08:46   Result Date: 03/21/2019 CLINICAL DATA:  38 year old patient presents for baseline mammogram. Palpable area felt in the medial right breast on recent clinical physical examination. 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 EXAM: DIGITAL DIAGNOSTIC BILATERAL MAMMOGRAM WITH CAD AND TOMO ULTRASOUND RIGHT BREAST COMPARISON:  None ACR Breast Density Category c: The breast tissue is heterogeneously dense, which may obscure small masses. FINDINGS: No mass, architectural distortion, or suspicious microcalcification is identified to suggest malignancy in either breast. Spot tangential view of the region of clinical concern in the medial right breast is negative. Mammographic images were processed with CAD. On physical exam, there is a thick cutaneous scar in the upper inner right  breast 2 o'clock region from prior aortic valve surgery. Inferior to this in the 3 to 4 o'clock region I do palpate ribs. I do not palpate a breast mass. Targeted ultrasound is performed, showing normal fibroglandular tissue in the 3-4 o'clock region of the left breast. No solid or cystic mass. No abnormal shadowing. IMPRESSION: No evidence of malignancy in either breast. RECOMMENDATION: Screening mammogram at age 52 unless there are persistent or intervening clinical concerns. (Code:SM-B-40A) I have discussed the findings and recommendations with the patient. Results were also provided in writing at the conclusion of the visit. If applicable, a reminder letter will be sent to the patient regarding the next appointment. BI-RADS CATEGORY  1: Negative. Electronically Signed: By: Curlene Dolphin M.D. On: 02/23/2019 08:46    Assessment & Plan:   Rosealie was seen today for hypertension.  Diagnoses and all orders for this visit:  Essential hypertension, benign- Her blood pressure is adequately well controlled.  Electrolytes and renal function are normal. -     CBC with Differential; Future -     Basic metabolic panel; Future  Long term (current) use of anticoagulants [Z79.01]- Her INR is in the normal range. -     Protime-INR; Future  Thrombocytopenia ([place])- She has a benign appearing bruise on the left lower extremity but no other evidence of coagulopathy.  This is likely ITP.  She will let me know if she develops any worsening bruising or develops any bleeding.  I have asked her to return in a few weeks to have her platelet count rechecked and I will screen her for B12 deficiency as well.   I have discontinued Cozette L. Hemann's nicotine. I am also having her maintain her lansoprazole, albuterol, warfarin, metoprolol tartrate, lamoTRIgine, busPIRone, gabapentin, traMADol, and DULoxetine.  No orders of the defined types were placed in this encounter.    Follow-up: Return in about 2 weeks (around  10/19/2019).  [name], MD

## 2019-10-06 ENCOUNTER — Telehealth: Payer: Self-pay | Admitting: Internal Medicine

## 2019-10-06 ENCOUNTER — Encounter: Payer: Self-pay | Admitting: Internal Medicine

## 2019-10-06 DIAGNOSIS — D696 Thrombocytopenia, unspecified: Secondary | ICD-10-CM | POA: Insufficient documentation

## 2019-10-06 NOTE — Telephone Encounter (Signed)
Patient called back in regard to labs.  I have given her Dr. Ronnald Ramp' response.   She is requesting a call back to review her platelet count.   States bruising has spread since last OV.   Did patient need to come in for lab work or OV in 2-3 weeks?

## 2019-10-06 NOTE — Patient Instructions (Signed)
Contusion A contusion is a deep bruise. Contusions are the result of a blunt injury to tissues and muscle fibers under the skin. The injury causes bleeding under the skin. The skin overlying the contusion may turn blue, purple, or yellow. Minor injuries will give you a painless contusion, but more severe injuries cause contusions that may stay painful and swollen for a few weeks. Follow these instructions at home: Pay attention to any changes in your symptoms. Let your health care provider know about them. Take these actions to relieve your pain. Managing pain, stiffness, and swelling   Use resting, icing, applying pressure (compression), and raising (elevating) the injured area. This is often called the RICE strategy. ? Rest the injured area. Return to your normal activities as told by your health care provider. Ask your health care provider what activities are safe for you. ? If directed, put ice on the injured area:  Put ice in a plastic bag.  Place a towel between your skin and the bag.  Leave the ice on for 20 minutes, 2-3 times per day. ? If directed, apply light compression to the injured area using an elastic bandage. Make sure the bandage is not wrapped too tightly. Remove and reapply the bandage as directed by your health care provider. ? If possible, raise (elevate) the injured area above the level of your heart while you are sitting or lying down. General instructions  Take over-the-counter and prescription medicines only as told by your health care provider.  Keep all follow-up visits as told by your health care provider. This is important. Contact a health care provider if:  Your symptoms do not improve after several days of treatment.  Your symptoms get worse.  You have difficulty moving the injured area. Get help right away if:  You have severe pain.  You have numbness in a hand or foot.  Your hand or foot turns pale or cold. Summary  A contusion is a deep  bruise.  Contusions are the result of a blunt injury to tissues and muscle fibers under the skin.  It is treated with rest, ice, compression, and elevation. You may be given over-the-counter medicines for pain.  Contact a health care provider if your symptoms do not improve, or get worse.  Get help right away if you have severe pain, have numbness, or the area turns pale or cold. This information is not intended to replace advice given to you by your health care provider. Make sure you discuss any questions you have with your health care provider. Document Released: 08/26/2005 Document Revised: 07/07/2018 Document Reviewed: 07/07/2018 Elsevier Patient Education  2020 [employer].

## 2019-10-10 ENCOUNTER — Other Ambulatory Visit: Payer: Self-pay | Admitting: Family Medicine

## 2019-10-19 ENCOUNTER — Other Ambulatory Visit: Payer: Self-pay

## 2019-10-19 ENCOUNTER — Other Ambulatory Visit (INDEPENDENT_AMBULATORY_CARE_PROVIDER_SITE_OTHER): Payer: Managed Care, Other (non HMO)

## 2019-10-19 ENCOUNTER — Ambulatory Visit (INDEPENDENT_AMBULATORY_CARE_PROVIDER_SITE_OTHER): Payer: Managed Care, Other (non HMO) | Admitting: Internal Medicine

## 2019-10-19 ENCOUNTER — Encounter: Payer: Self-pay | Admitting: Internal Medicine

## 2019-10-19 VITALS — BP 128/84 | HR 73 | Temp 98.8°F | Resp 16 | Ht 60.0 in | Wt 122.0 lb

## 2019-10-19 DIAGNOSIS — D696 Thrombocytopenia, unspecified: Secondary | ICD-10-CM | POA: Diagnosis not present

## 2019-10-19 DIAGNOSIS — Z7901 Long term (current) use of anticoagulants: Secondary | ICD-10-CM | POA: Diagnosis not present

## 2019-10-19 LAB — CBC WITH DIFFERENTIAL/PLATELET
Basophils Absolute: 0 10*3/uL (ref 0.0–0.1)
Basophils Relative: 0.7 % (ref 0.0–3.0)
Eosinophils Absolute: 0.2 10*3/uL (ref 0.0–0.7)
Eosinophils Relative: 2.4 % (ref 0.0–5.0)
HCT: 45.1 % (ref 36.0–46.0)
Hemoglobin: 15.2 g/dL — ABNORMAL HIGH (ref 12.0–15.0)
Lymphocytes Relative: 39.4 % (ref 12.0–46.0)
Lymphs Abs: 2.6 10*3/uL (ref 0.7–4.0)
MCHC: 33.6 g/dL (ref 30.0–36.0)
MCV: 92.1 fl (ref 78.0–100.0)
Monocytes Absolute: 0.4 10*3/uL (ref 0.1–1.0)
Monocytes Relative: 6.8 % (ref 3.0–12.0)
Neutro Abs: 3.3 10*3/uL (ref 1.4–7.7)
Neutrophils Relative %: 50.7 % (ref 43.0–77.0)
Platelets: 157 10*3/uL (ref 150.0–400.0)
RBC: 4.9 Mil/uL (ref 3.87–5.11)
RDW: 13.9 % (ref 11.5–15.5)
WBC: 6.5 10*3/uL (ref 4.0–10.5)

## 2019-10-19 LAB — PROTIME-INR
INR: 2.7 ratio — ABNORMAL HIGH (ref 0.8–1.0)
Prothrombin Time: 30.5 s — ABNORMAL HIGH (ref 9.6–13.1)

## 2019-10-19 NOTE — Progress Notes (Addendum)
Subjective:  Patient ID: PACE PUMA, female    DOB: 1981-08-17  Age: 38 y.o. MRN: [id number]  CC: Follow-up  This visit occurred during the SARS-CoV-2 public health emergency.  Safety protocols were in place, including screening questions prior to the visit, additional usage of staff PPE, and extensive cleaning of exam room while observing appropriate contact time as indicated for disinfecting solutions.     HPI Danielle Obrien presents for f/up - She has been seen recently for a bruise on the left lower extremity.  She tells me the area is healing with no symptoms or complications.  She is having no other troubles with bleeding or bruising.  Her recent labs showed that the INR was therapeutic and her platelet count was very slightly low.  Outpatient Medications Prior to Visit  Medication Sig Dispense Refill   albuterol (PROVENTIL HFA;VENTOLIN HFA) 108 (90 Base) MCG/ACT inhaler Inhale 2 puffs into the lungs every 6 (six) hours as needed for wheezing or shortness of breath. 1 Inhaler 3   busPIRone (BUSPAR) 30 MG tablet TAKE ONE TABLET BY MOUTH TWICE A DAY 60 tablet 0   DULoxetine (CYMBALTA) 20 MG capsule TAKE ONE CAPSULE BY MOUTH DAILY 30 capsule 0   gabapentin (NEURONTIN) 300 MG capsule TAKE TWO CAPSULES BY MOUTH THREE TIMES A DAY 360 capsule 0   lamoTRIgine (LAMICTAL) 200 MG tablet TAKE TWO TABLETS BY MOUTH DAILY 180 tablet 0   lansoprazole (PREVACID) 15 MG capsule Take 15 mg by mouth daily as needed (for heartburn or acid reflux).      metoprolol tartrate (LOPRESSOR) 25 MG tablet Take 0.5 tablets (12.5 mg total) by mouth 2 (two) times daily. Pt must make appt with provider to get further refills - 2nd attempt 15 tablet 0   traMADol (ULTRAM) 50 MG tablet TAKE TWO TABLETS BY MOUTH TWICE A DAY 120 tablet 0   warfarin (COUMADIN) 7.5 MG tablet TAKE 1 TABLET BY MOUTH DAILY , EXCEPT 1.5 TABLETS ON SUNDAY, TUESDAY, THURSDAY OR TAKE AS DIRECTED BY COUMADIN CLINIC 40 tablet 3    No facility-administered medications prior to visit.     ROS Review of Systems  Constitutional: Negative for diaphoresis, fatigue and unexpected weight change.  HENT: Negative.   Eyes: Negative for visual disturbance.  Respiratory: Negative for cough, chest tightness, shortness of breath and wheezing.   Cardiovascular: Negative for chest pain, palpitations and leg swelling.  Gastrointestinal: Negative for abdominal pain, blood in stool, constipation, diarrhea, nausea and vomiting.  Endocrine: Negative.   Genitourinary: Negative for difficulty urinating.  Musculoskeletal: Negative for arthralgias and myalgias.  Skin: Negative.  Negative for color change and pallor.  Neurological: Negative for dizziness, weakness, light-headedness and headaches.  Hematological: Negative for adenopathy. Does not bruise/bleed easily.  Psychiatric/Behavioral: Negative.     Objective:  BP 128/84 (BP Location: Left Arm, Patient Position: Sitting, Cuff Size: Normal)    Pulse 73    Temp 98.8 F (37.1 C) (Oral)    Resp 16    Ht 5' (1.524 m)    Wt 122 lb (55.3 kg)    SpO2 98%    BMI 23.83 kg/m   BP Readings from Last 3 Encounters:  10/19/19 128/84  10/05/19 140/78  05/18/19 116/80    Wt Readings from Last 3 Encounters:  10/19/19 122 lb (55.3 kg)  10/05/19 125 lb (56.7 kg)  05/18/19 124 lb (56.2 kg)    Physical Exam Constitutional:      Appearance: Normal appearance.  HENT:     Nose: Nose normal.  Eyes:     General: No scleral icterus.    Conjunctiva/sclera: Conjunctivae normal.  Neck:     Musculoskeletal: No muscular tenderness.  Cardiovascular:     Rate and Rhythm: Normal rate and regular rhythm.     Heart sounds: Murmur present.  Pulmonary:     Breath sounds: No wheezing, rhonchi or rales.  Abdominal:     General: Abdomen is flat. Bowel sounds are normal.     Palpations: There is no hepatomegaly or splenomegaly.  Musculoskeletal: Normal range of motion.     Right lower leg: No  edema.     Left lower leg: No edema.       Legs:  Lymphadenopathy:     Cervical: No cervical adenopathy.  Skin:    General: Skin is warm and dry.  Neurological:     Mental Status: She is alert.     Lab Results  Component Value Date   WBC 6.5 10/19/2019   HGB 15.2 (H) 10/19/2019   HCT 45.1 10/19/2019   PLT 157.0 10/19/2019   GLUCOSE 94 10/05/2019   CHOL 256 (H) 12/22/2018   TRIG 84.0 12/22/2018   HDL 74.70 12/22/2018   LDLDIRECT 171.0 01/27/2013   LDLCALC 165 (H) 12/22/2018   ALT 25 11/01/2017   AST 24 11/01/2017   NA 140 10/05/2019   K 4.5 10/05/2019   CL 105 10/05/2019   CREATININE 0.94 10/05/2019   BUN 7 10/05/2019   CO2 31 10/05/2019   TSH 0.64 01/14/2016   INR 2.7 (H) 10/19/2019   HGBA1C 5.3 12/22/2018    US Breast Ltd Uni Right Inc Axilla  Result Date: 02/28/2019 CLINICAL DATA:  38 year old patient presents for baseline mammogram. Palpable area felt in the medial right breast on recent clinical physical examination. 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 EXAM: DIGITAL DIAGNOSTIC BILATERAL MAMMOGRAM WITH CAD AND TOMO ULTRASOUND RIGHT BREAST COMPARISON:  None ACR Breast Density Category c: The breast tissue is heterogeneously dense, which may obscure small masses. FINDINGS: No mass, architectural distortion, or suspicious microcalcification is identified to suggest malignancy in either breast. Spot tangential view of the region of clinical concern in the medial right breast is negative. Mammographic images were processed with CAD. On physical exam, there is a thick cutaneous scar in the upper inner right breast 2 o'clock region from prior aortic valve surgery. Inferior to this in the 3 to 4 o'clock region I do palpate ribs. I do not palpate a breast mass. Targeted ultrasound is performed, showing normal fibroglandular tissue in the 3-4 o'clock region of the left breast. No solid or cystic mass. No abnormal  shadowing. IMPRESSION: No evidence of malignancy in either breast. RECOMMENDATION: Screening mammogram at age 38 unless there are persistent or intervening clinical concerns. (Code:SM-B-40A) I have discussed the findings and recommendations with the patient. Results were also provided in writing at the conclusion of the visit. If applicable, a reminder letter will be sent to the patient regarding the next appointment. BI-RADS CATEGORY  1: Negative. Electronically Signed   By: Curlene Dolphin M.D.   On: 02/23/2019 08:46   Mm Diag Breast Tomo Bilateral  Addendum Date: 03/21/2019   ADDENDUM REPORT: 03/21/2019 08:46 ADDENDUM: This addendum is created to correct a left/right error in the findings section of the report. In the ultrasound section, findings should read as follows: Targeted ultrasound is performed, showing normal fibroglandular tissue in the  3-4 o'clock region of the RIGHT breast. No solid or cystic mass. No abnormal shadowing. Electronically Signed   By: Curlene Dolphin M.D.   On: 03/21/2019 08:46   Result Date: 03/21/2019 CLINICAL DATA:  38 year old patient presents for baseline mammogram. Palpable area felt in the medial right breast on recent clinical physical examination. 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 EXAM: DIGITAL DIAGNOSTIC BILATERAL MAMMOGRAM WITH CAD AND TOMO ULTRASOUND RIGHT BREAST COMPARISON:  None ACR Breast Density Category c: The breast tissue is heterogeneously dense, which may obscure small masses. FINDINGS: No mass, architectural distortion, or suspicious microcalcification is identified to suggest malignancy in either breast. Spot tangential view of the region of clinical concern in the medial right breast is negative. Mammographic images were processed with CAD. On physical exam, there is a thick cutaneous scar in the upper inner right breast 2 o'clock region from prior aortic valve surgery. Inferior to this in the  3 to 4 o'clock region I do palpate ribs. I do not palpate a breast mass. Targeted ultrasound is performed, showing normal fibroglandular tissue in the 3-4 o'clock region of the left breast. No solid or cystic mass. No abnormal shadowing. IMPRESSION: No evidence of malignancy in either breast. RECOMMENDATION: Screening mammogram at age 78 unless there are persistent or intervening clinical concerns. (Code:SM-B-40A) I have discussed the findings and recommendations with the patient. Results were also provided in writing at the conclusion of the visit. If applicable, a reminder letter will be sent to the patient regarding the next appointment. BI-RADS CATEGORY  1: Negative. Electronically Signed: By: Curlene Dolphin M.D. On: 02/23/2019 08:46    Assessment & Plan:   Lequita was seen today for follow-up.  Diagnoses and all orders for this visit:  Thrombocytopenia ([place])- Her platelet count is low normal now at 157.  B12 and folate are normal.  She is having no ongoing issues with bleeding or bruising.  I offered her reassurance. -     CBC with Differential; Future -     B12; Future -     Folate; Future  Long term (current) use of anticoagulants [Z79.01]- Her INR is in the therapeutic range.  Will continue the current dose of Coumadin. -     Protime-INR; Future   I am having Shontel L. Martucci maintain her lansoprazole, albuterol, warfarin, metoprolol tartrate, lamoTRIgine, busPIRone, DULoxetine, traMADol, and gabapentin.  No orders of the defined types were placed in this encounter.    Follow-up: Return if symptoms worsen or fail to improve.  [name], MD

## 2019-10-19 NOTE — Patient Instructions (Signed)
Thrombocytopenia Thrombocytopenia means that you have a low number of platelets in your blood. Platelets are tiny cells in the blood. When you bleed, they clump together at the cut or injury to stop the bleeding. This is called blood clotting. If you do not have enough platelets, it can cause bleeding problems. Some cases of this condition are mild while others are more severe. What are the causes? This condition may be caused by:  Your body not making enough platelets. This may be caused by: ? Your bone marrow not making blood cells (aplastic anemia). ? Cancer in the bone marrow. ? Certain medicines. ? Infection in the bone marrow. ? Drinking a lot of alcohol.  Your body destroying platelets too quickly. This may be caused by: ? Certain immune diseases. ? Certain medicines. ? Certain blood clotting disorders. ? Certain disorders that are passed from parent to child (inherited). ? Certain bleeding disorders. ? Pregnancy. ? Having a spleen that is larger than normal. What are the signs or symptoms?  Bleeding that is not normal.  Nosebleeds.  Heavy menstrual periods.  Blood in the pee (urine) or poop (stool).  A purple-like color to the skin (purpura).  Bruising.  A rash that looks like pinpoint, purple-red spots (petechiae). How is this treated?  Treatment of another condition that is causing the low platelet count.  Medicines to help protect your platelets from being destroyed.  A replacement (transfusion) of platelets to stop or prevent bleeding.  Surgery to remove the spleen. Follow these instructions at home: Activity  Avoid activities that could cause you to get hurt or bruised. Follow instructions about how to prevent falls.  Take care not to cut yourself: ? When you shave. ? When you use scissors, needles, knives, or other tools.  Take care not to burn yourself: ? When you use an iron. ? When you cook. General instructions   Check your skin and the  inside of your mouth for bruises or blood as told by your doctor.  Check to see if there is blood in your spit (sputum), pee, and poop. Do this as told by your doctor.  Do not drink alcohol.  Take over-the-counter and prescription medicines only as told by your doctor.  Do not take any medicines that have aspirin or NSAIDs in them. These medicines can thin your blood and cause you to bleed.  Tell all of your doctors that you have this condition. Be sure to tell your dentist and eye doctor too. Contact a doctor if:  You have bruises and you do not know why. Get help right away if:  You are bleeding anywhere on your body.  You have blood in your spit, pee, or poop. Summary  Thrombocytopenia means that you have a low number of platelets in your blood.  Platelets are needed for blood clotting.  Symptoms of this condition include bleeding that is not normal, and bruising.  Take care not to cut or burn yourself. This information is not intended to replace advice given to you by your health care provider. Make sure you discuss any questions you have with your health care provider. Document Released: 11/05/2011 Document Revised: 08/18/2018 Document Reviewed: 08/18/2018 Elsevier Patient Education  2020 Elsevier Inc.  

## 2019-10-20 LAB — VITAMIN B12: Vitamin B-12: 270 pg/mL (ref 211–911)

## 2019-10-20 LAB — FOLATE: Folate: 6.9 ng/mL (ref >5.9)

## 2019-10-23 ENCOUNTER — Other Ambulatory Visit: Payer: Self-pay | Admitting: Internal Medicine

## 2019-10-23 DIAGNOSIS — F411 Generalized anxiety disorder: Secondary | ICD-10-CM

## 2019-10-23 DIAGNOSIS — F3177 Bipolar disorder, in partial remission, most recent episode mixed: Secondary | ICD-10-CM

## 2019-10-29 ENCOUNTER — Other Ambulatory Visit: Payer: Self-pay | Admitting: Family Medicine

## 2019-11-01 ENCOUNTER — Ambulatory Visit: Payer: Managed Care, Other (non HMO)

## 2019-11-02 ENCOUNTER — Ambulatory Visit: Payer: Managed Care, Other (non HMO)

## 2019-11-08 ENCOUNTER — Other Ambulatory Visit: Payer: Self-pay | Admitting: Cardiology

## 2019-11-08 NOTE — Telephone Encounter (Signed)
Pt overdue for INR appt, left a message for the pt to callback.

## 2019-11-12 ENCOUNTER — Other Ambulatory Visit: Payer: Self-pay | Admitting: Family Medicine

## 2019-11-20 NOTE — Telephone Encounter (Signed)
LMOM TCB for appt, overdue for follow-up.

## 2019-11-21 NOTE — Telephone Encounter (Addendum)
Pt is overdue for appt; called and the pt answered. She stated she did not know she could call back and leave a msg, advised that the voicemails that I have left made it clear to call back as we cannot process the refill. She stated she just has been busy with work and this is her first day off, apologized for the busy season. Also, she stated that Dr. Ronnald Ramp checked it and stated he was going to send the results to [place], advised that would have been great to get in a timely manner but we have not received it and since it was in November we need to set her up for an appt. Appt set and pt has enough warfarin meds to last until appt. Will deny this refill at this time and pt is aware.

## 2019-11-27 ENCOUNTER — Other Ambulatory Visit: Payer: Self-pay | Admitting: Family Medicine

## 2019-11-28 ENCOUNTER — Other Ambulatory Visit: Payer: Self-pay

## 2019-11-28 ENCOUNTER — Ambulatory Visit (INDEPENDENT_AMBULATORY_CARE_PROVIDER_SITE_OTHER): Payer: Managed Care, Other (non HMO) | Admitting: *Deleted

## 2019-11-28 DIAGNOSIS — Q230 Congenital stenosis of aortic valve: Secondary | ICD-10-CM | POA: Diagnosis not present

## 2019-11-28 DIAGNOSIS — Z954 Presence of other heart-valve replacement: Secondary | ICD-10-CM | POA: Diagnosis not present

## 2019-11-28 DIAGNOSIS — Z7901 Long term (current) use of anticoagulants: Secondary | ICD-10-CM

## 2019-11-28 DIAGNOSIS — Z5181 Encounter for therapeutic drug level monitoring: Secondary | ICD-10-CM | POA: Diagnosis not present

## 2019-11-28 DIAGNOSIS — Q231 Congenital insufficiency of aortic valve: Secondary | ICD-10-CM | POA: Diagnosis not present

## 2019-11-28 LAB — POCT INR: INR: 2.8 (ref 2.0–3.0)

## 2019-11-28 MED ORDER — WARFARIN SODIUM 7.5 MG PO TABS: ORAL_TABLET | ORAL | 2 refills | Status: DC

## 2019-11-28 NOTE — Patient Instructions (Signed)
Description   Continue on same dosage 1 tablet daily except 1.5 tablets on Sundays and Thursdays.  Recheck INR in 6 weeks.  [facility] [phone] Main [phone]

## 2019-12-05 ENCOUNTER — Other Ambulatory Visit: Payer: Self-pay | Admitting: Family Medicine

## 2019-12-14 ENCOUNTER — Ambulatory Visit (INDEPENDENT_AMBULATORY_CARE_PROVIDER_SITE_OTHER): Payer: Managed Care, Other (non HMO) | Admitting: Cardiology

## 2019-12-14 ENCOUNTER — Other Ambulatory Visit: Payer: Self-pay

## 2019-12-14 ENCOUNTER — Encounter: Payer: Self-pay | Admitting: Cardiology

## 2019-12-14 ENCOUNTER — Encounter (INDEPENDENT_AMBULATORY_CARE_PROVIDER_SITE_OTHER): Payer: Self-pay

## 2019-12-14 VITALS — BP 130/78 | HR 63 | Ht 60.0 in | Wt 123.2 lb

## 2019-12-14 DIAGNOSIS — I10 Essential (primary) hypertension: Secondary | ICD-10-CM | POA: Diagnosis not present

## 2019-12-14 DIAGNOSIS — I350 Nonrheumatic aortic (valve) stenosis: Secondary | ICD-10-CM

## 2019-12-14 MED ORDER — METOPROLOL TARTRATE 25 MG PO TABS: 12.5000 mg | ORAL_TABLET | Freq: Two times a day (BID) | ORAL | 0 refills | Status: DC

## 2019-12-14 NOTE — Progress Notes (Signed)
Cardiology Office Note:    Date:  12/14/2019   ID:  Danielle Obrien, DOB 1981-08-07, MRN [id number]  PCP:  Janith Lima, MD  Cardiologist:  Fransico Him, MD    Referring MD: Janith Lima, MD   Chief Complaint  Patient presents with  . Aortic Stenosis    History of Present Illness:    Danielle Obrien is a 39 y.o. female with a hx of bicuspid AV with severe AS, bipolar disorder, asthma and arthritis. She underwent minimally invasive AVR with a 30mm Sorin Carbomedics Top Hat bileaflet mechanical valve via right anterior mini thoracotomy on 11/03/2017 by Dr. Roxy Manns after coronary CT showed no CAD.    She is here today for followup and is doing well.  She denies any chest pain or pressure, SOB, DOE, PND, orthopnea, LE edema, dizziness, palpitations or syncope. She is compliant with her meds and is tolerating meds with no SE.    Past Medical History:  Diagnosis Date  . Abnormal Pap smear of cervix    --recurrent ascus w/Pos. HR HPV  . ADD (attention deficit disorder)   . Alcohol abuse, in remission 2012  . Allergy   . Anxiety   . Aortic stenosis due to bicuspid aortic valve 02/20/2014  . Arthritis    In hips  . Asthma    rare inhaler use  . Bipolar disorder ([place])   . Headache   . Hyperlipidemia with target LDL less than 130 01/27/2013  . Hypertension   . Muscle spasms of both lower extremities    both hips  . 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 Tobacco abuse 09/27/2015  . 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 ABDOMINAL HYSTERECTOMY  02/06/2009   Robotic total laparoscopic hysterectomy  . ANTERIOR CRUCIATE LIGAMENT REPAIR  1993  . AORTIC VALVE REPLACEMENT N/A 11/03/2017   Procedure: MINIMALLY INVASIVE  AORTIC VALVE REPLACEMENT( MINI THORACOTOMY);  Surgeon: Rexene Alberts, MD;  Location: [facility];  Service: Open Heart Surgery;  Laterality: N/A;  . CARDIAC CATHETERIZATION  June 2015   no CAD - moderate AS noted  . 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 COLPOSCOPY  10/2008   CIN I & II  . COLPOSCOPY  07/2000   Neg. ECC  . COLPOSCOPY  06/2001   CIN I  . COLPOSCOPY  08/2004   ECC--atypia  . COLPOSCOPY N/A 01/21/2016   Procedure: COLPOSCOPY with vaginal biopsy with CO 2 Laser of Vaginal and vulvar condyloma;  Surgeon: [name], MD;  Location: [facility];  Service: Gynecology;  Laterality: N/A;  [name] will be here 2/21 for 1115 case confirmed 01/16/15 - TS  . HERNIA REPAIR  1981/1982  . HIP SURGERY  1981   Hip Reset   . HIP SURGERY  1990   Plate was reconstructed/ took out growth plate in Right knee  . HIP SURGERY  1992   Plate removed in Left hip  . LEFT AND RIGHT HEART CATHETERIZATION WITH CORONARY ANGIOGRAM N/A 05/18/2014   Procedure: LEFT AND RIGHT HEART CATHETERIZATION WITH CORONARY ANGIOGRAM;  Surgeon: Jettie Booze, MD;  Location: [facility] CATH LAB;  Service: Cardiovascular;  Laterality: N/A;  .  LYMPH NODE BIOPSY  1995  . NASAL SEPTUM SURGERY  2002  . TEE WITHOUT CARDIOVERSION N/A 11/03/2017   Procedure: TRANSESOPHAGEAL ECHOCARDIOGRAM (TEE);  Surgeon: Rexene Alberts, MD;  Location: [facility];  Service: Open Heart Surgery;  Laterality: N/A;  . TONSILLECTOMY AND ADENOIDECTOMY  1990  . TYMPANOSTOMY TUBE PLACEMENT  1981/1982    Current Medications: Current Meds  Medication Sig  . albuterol (PROVENTIL HFA;VENTOLIN HFA) 108 (90 Base) MCG/ACT inhaler Inhale 2 puffs into the lungs every 6 (six) hours as needed for wheezing or shortness of breath.  . DULoxetine (CYMBALTA) 20 MG capsule TAKE ONE CAPSULE BY MOUTH DAILY  . gabapentin (NEURONTIN) 300 MG capsule TAKE TWO CAPSULES BY MOUTH THREE TIMES A DAY  . lamoTRIgine (LAMICTAL) 200 MG  tablet TAKE TWO TABLETS BY MOUTH DAILY  . lansoprazole (PREVACID) 15 MG capsule Take 15 mg by mouth daily as needed (for heartburn or acid reflux).   . metoprolol tartrate (LOPRESSOR) 25 MG tablet Take 0.5 tablets (12.5 mg total) by mouth 2 (two) times daily. Pt must make appt with provider to get further refills - 2nd attempt  . traMADol (ULTRAM) 50 MG tablet TAKE TWO TABLETS BY MOUTH TWICE A DAY  . warfarin (COUMADIN) 7.5 MG tablet TAKE 1 TABLET BY MOUTH DAILY , EXCEPT 1.5 TABLETS ON SUNDAY, TUESDAY, THURSDAY OR TAKE AS DIRECTED BY COUMADIN CLINIC  . [DISCONTINUED] metoprolol tartrate (LOPRESSOR) 25 MG tablet Take 0.5 tablets (12.5 mg total) by mouth 2 (two) times daily. Pt must make appt with provider to get further refills - 2nd attempt     Allergies:   Demerol, Ibuprofen, and Codeine   Social History   Socioeconomic History  . Marital status: Single    Spouse name: Not on file  . Number of children: Not on file  . Years of education: Not on file  . Highest education level: Not on file  Occupational History  . Not on file  Tobacco Use  . Smoking status: Former Smoker    Packs/day: 0.50    Years: 12.00    Pack years: 6.00    Types: Cigarettes    Quit date: 11/02/2017    Years since quitting: 2.1  . Smokeless tobacco: Never Used  . Tobacco comment: pt given fake cigarette for hand/mouth association. pt using [facility] for support  Substance and Sexual Activity  . Alcohol use: No    Alcohol/week: 0.0 standard drinks  . Drug use: No  . Sexual activity: Yes    Partners: Male    Birth control/protection: Surgical    Comment: Hysterectomy  Other Topics Concern  . Not on file  Social History Narrative  . Not on file   Social Determinants of Health   Financial Resource Strain:   . Difficulty of Paying Living Expenses: Not on file  Food Insecurity:   . Worried About Charity fundraiser in the Last Year: Not on file  . Ran Out of Food in the Last Year: Not on file    Transportation Needs:   . Lack of Transportation (Medical): Not on file  . Lack of Transportation (Non-Medical): Not on file  Physical Activity:   . Days of Exercise per Week: Not on file  . Minutes of Exercise per Session: Not on file  Stress:   . Feeling of Stress : Not on file  Social Connections:   . Frequency of Communication with Friends and Family: Not on file  . Frequency of Social Gatherings with Friends  and Family: Not on file  . Attends Religious Services: Not on file  . Active Member of Clubs or Organizations: Not on file  . Attends Archivist Meetings: Not on file  . Marital Status: Not on file     Family History: The patient's family history includes Hyperlipidemia in her brother, father, and mother; Hypertension in her brother, father, and mother; Migraines in her father; Thyroid disease in her maternal grandfather, maternal grandmother, and mother. There is no history of Cancer.  ROS:   Please see the history of present illness.    ROS  All other systems reviewed and negative.   EKGs/Labs/Other Studies Reviewed:    The following studies were reviewed today: 2D echo  EKG:  EKG is  ordered today.  The ekg ordered today demonstrates NSR with no ST changes  Recent Labs: 10/05/2019: BUN 7; Creatinine, Ser 0.94; Potassium 4.5; Sodium 140 10/19/2019: Hemoglobin 15.2; Platelets 157.0   Recent Lipid Panel    Component Value Date/Time   CHOL 256 (H) 12/22/2018 1414   TRIG 84.0 12/22/2018 1414   HDL 74.70 12/22/2018 1414   CHOLHDL 3 12/22/2018 1414   VLDL 16.8 12/22/2018 1414   LDLCALC 165 (H) 12/22/2018 1414   LDLDIRECT 171.0 01/27/2013 1425    Physical Exam:    VS:  BP 130/78   Pulse 63   Ht 5' (1.524 m)   Wt 123 lb 3.2 oz (55.9 kg)   BMI 24.06 kg/m     Wt Readings from Last 3 Encounters:  12/14/19 123 lb 3.2 oz (55.9 kg)  10/19/19 122 lb (55.3 kg)  10/05/19 125 lb (56.7 kg)     GEN:  Well nourished, well developed in no acute  distress HEENT: Normal NECK: No JVD; No carotid bruits LYMPHATICS: No lymphadenopathy CARDIAC: RRR, no murmurs, rubs, gallops RESPIRATORY:  Clear to auscultation without rales, wheezing or rhonchi  ABDOMEN: Soft, non-tender, non-distended MUSCULOSKELETAL:  No edema; No deformity  SKIN: Warm and dry NEUROLOGIC:  Alert and oriented x 3 PSYCHIATRIC:  Normal affect   ASSESSMENT:    1. Nonrheumatic aortic valve stenosis   2. Essential hypertension, benign    PLAN:    In order of problems listed above:  1.  Severe AS -s/p minimally invasive AVR with a 51mm Sorin Carbomedics Top Hat bileaflet mechanical valve via right anterior mini thoracotomy on 11/03/2017 -she is asymptomatic -continue warfarin and ASA -discussed SBE prophylaxis for dental procedures  2.  HTN -Bp controlled -continue Lopressor 12.5mg  BID   Medication Adjustments/Labs and Tests Ordered: Current medicines are reviewed at length with the patient today.  Concerns regarding medicines are outlined above.  Orders Placed This Encounter  Procedures  . EKG 12/Charge capture  . ECHOCARDIOGRAM COMPLETE   Meds ordered this encounter  Medications  . metoprolol tartrate (LOPRESSOR) 25 MG tablet    Sig: Take 0.5 tablets (12.5 mg total) by mouth 2 (two) times daily. Pt must make appt with provider to get further refills - 2nd attempt    Dispense:  15 tablet    Refill:  0    Signed, Fransico Him, MD  12/14/2019 1:15 PM    [facility]

## 2019-12-14 NOTE — Patient Instructions (Signed)
Medication Instructions:  Your physician recommends that you continue on your current medications as directed. Please refer to the Current Medication list given to you today.  *If you need a refill on your cardiac medications before your next appointment, please call your pharmacy*  Testing/Procedures: Your physician has requested that you have an echocardiogram. Echocardiography is a painless test that uses sound waves to create images of your heart. It provides your doctor with information about the size and shape of your heart and how well your heart's chambers and valves are working. This procedure takes approximately one hour. There are no restrictions for this procedure.  Follow-Up: At [facility], you and your health needs are our priority.  As part of our continuing mission to provide you with exceptional heart care, we have created designated Provider Care Teams.  These Care Teams include your primary Cardiologist (physician) and Advanced Practice Providers (APPs -  Physician Assistants and Nurse Practitioners) who all work together to provide you with the care you need, when you need it.  Your next appointment:   1 year(s)  The format for your next appointment:   In Person  Provider:   Fransico Him, MD  Other Instructions COVID-19 Vaccine Information can be found at: [email or web address].uk For questions related to vaccine distribution or appointments, please email vaccine@[facility] .com or call 7196711921.

## 2019-12-27 ENCOUNTER — Other Ambulatory Visit: Payer: Self-pay | Admitting: Family Medicine

## 2019-12-28 ENCOUNTER — Other Ambulatory Visit (HOSPITAL_COMMUNITY): Payer: Managed Care, Other (non HMO)

## 2019-12-31 ENCOUNTER — Other Ambulatory Visit: Payer: Self-pay | Admitting: Family Medicine

## 2020-01-02 ENCOUNTER — Other Ambulatory Visit: Payer: Self-pay | Admitting: Family Medicine

## 2020-01-18 ENCOUNTER — Ambulatory Visit: Payer: Managed Care, Other (non HMO) | Admitting: Obstetrics and Gynecology

## 2020-01-31 ENCOUNTER — Other Ambulatory Visit: Payer: Self-pay | Admitting: Family Medicine

## 2020-02-05 ENCOUNTER — Other Ambulatory Visit: Payer: Self-pay | Admitting: Family Medicine

## 2020-02-28 ENCOUNTER — Other Ambulatory Visit: Payer: Self-pay | Admitting: Family Medicine

## 2020-02-29 ENCOUNTER — Other Ambulatory Visit: Payer: Self-pay | Admitting: Cardiology

## 2020-02-29 NOTE — Telephone Encounter (Signed)
Overdue for an INR check. Called pt and LMOM.

## 2020-03-01 ENCOUNTER — Other Ambulatory Visit: Payer: Self-pay | Admitting: Family Medicine

## 2020-03-04 NOTE — Telephone Encounter (Signed)
4/5 at 345pm-Called pt since she is overdue for an Anticoagulation appt, unable to leave a message because her voicemail is full. Will not refill warfarin at this time because pt needs her INR monitored.

## 2020-03-05 ENCOUNTER — Telehealth: Payer: Self-pay

## 2020-03-05 ENCOUNTER — Other Ambulatory Visit: Payer: Self-pay | Admitting: Cardiology

## 2020-03-05 ENCOUNTER — Telehealth: Payer: Self-pay | Admitting: *Deleted

## 2020-03-05 MED ORDER — WARFARIN SODIUM 7.5 MG PO TABS: ORAL_TABLET | ORAL | 1 refills | Status: DC

## 2020-03-05 NOTE — Telephone Encounter (Signed)
New message    *STAT* If patient is at the pharmacy, call can be transferred to refill team.   1. Which medications need to be refilled? (please list name of each medication and dose if known)warfarin (COUMADIN) 7.5 MG tablet  2. Which pharmacy/location (including street and city if local pharmacy) is medication to be sent to?[facility] [address] [place] [address]  3. Do they need a 30 day or 90 day supply? [place]

## 2020-03-05 NOTE — Telephone Encounter (Signed)
Patient in 08 recall for 02/21. Attempted to reach patient to schedule aex, but voicemail full. Unable to leave a message at this time.

## 2020-03-05 NOTE — Telephone Encounter (Signed)
Done.  90 day supply of warfarin sent to [facility].  Has upcoming INR appt on 03/07/20.

## 2020-03-07 ENCOUNTER — Ambulatory Visit (INDEPENDENT_AMBULATORY_CARE_PROVIDER_SITE_OTHER): Payer: Managed Care, Other (non HMO) | Admitting: *Deleted

## 2020-03-07 ENCOUNTER — Other Ambulatory Visit: Payer: Self-pay

## 2020-03-07 DIAGNOSIS — Z7901 Long term (current) use of anticoagulants: Secondary | ICD-10-CM | POA: Diagnosis not present

## 2020-03-07 DIAGNOSIS — Z954 Presence of other heart-valve replacement: Secondary | ICD-10-CM

## 2020-03-07 DIAGNOSIS — Z5181 Encounter for therapeutic drug level monitoring: Secondary | ICD-10-CM | POA: Diagnosis not present

## 2020-03-07 DIAGNOSIS — Q231 Congenital insufficiency of aortic valve: Secondary | ICD-10-CM | POA: Diagnosis not present

## 2020-03-07 DIAGNOSIS — Q230 Congenital stenosis of aortic valve: Secondary | ICD-10-CM

## 2020-03-07 LAB — POCT INR: INR: 2.3 (ref 2.0–3.0)

## 2020-03-07 NOTE — Patient Instructions (Signed)
Description   Continue on same dosage 1 tablet daily except 1.5 tablets on Sundays and Thursdays.  Recheck INR in 6 weeks.  [facility] [phone] Main [phone]

## 2020-03-07 NOTE — Addendum Note (Signed)
Addended by: Samaiya Awadallah E on: 03/07/2020 02:18 PM   Modules accepted: Orders

## 2020-03-08 ENCOUNTER — Telehealth: Payer: Self-pay | Admitting: Cardiology

## 2020-03-08 NOTE — Telephone Encounter (Signed)
We are recommending the COVID-19 vaccine to all of our patients. Cardiac medications (including blood thinners) should not deter anyone from being vaccinated and there is no need to hold any of those medications prior to vaccine administration.     Currently, there is a hotline to call (active 12/08/19) to schedule vaccination appointments as no walk-ins will be accepted.   Number: [phone].    If an appointment is not available please go to [email or web address] to sign up for notification when additional vaccine appointments are available.   If you have further questions or concerns about the vaccine process, please visit [email or web address] or contact your primary care physician.   

## 2020-03-15 ENCOUNTER — Other Ambulatory Visit: Payer: Self-pay | Admitting: Family Medicine

## 2020-03-15 NOTE — Telephone Encounter (Signed)
Patient scheduled for virtual visit next Thursday per her requested day, to discuss rx refill.

## 2020-03-18 MED ORDER — TRAMADOL HCL 50 MG PO TABS: 100.0000 mg | ORAL_TABLET | Freq: Two times a day (BID) | ORAL | 0 refills | Status: DC

## 2020-03-18 NOTE — Addendum Note (Signed)
Addended by: Lyndal Pulley on: 03/18/2020 04:32 PM   Modules accepted: Orders

## 2020-03-18 NOTE — Telephone Encounter (Signed)
She asked if this could be refilled prior to her virtual visit? She is completely out.

## 2020-03-18 NOTE — Telephone Encounter (Signed)
Message routed to Dr. Tamala Julian. Patient needs to keep appointment or will not receive any future refills.

## 2020-03-20 NOTE — Progress Notes (Signed)
Virtual Visit via Video Note  I connected with Danielle Obrien on 03/21/20 at  4:15 PM EDT by a video enabled telemedicine application and verified that I am speaking with the correct person using two identifiers.  Location: Patient: Patient is in work setting alone Provider: In office setting  I discussed the limitations of evaluation and management by telemedicine and the availability of in person appointments. The patient expressed understanding and agreed to proceed.  History of Present Illness: 03/13/2019 Continues to have pain, discussed HEP, continue the orthotics, same meds, refilled pain meds today as well. Will recheck every 3 months. Warned of long term use of opiods and monitor for symptoms. Continue vitamins  RTC in 3 months    03/20/2020 Patient states she continues to take tramadol for her chronic pain at this time. 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 Patient also has moderate narrowing of the L4-L5 and L5-S1 disc space. Patient has been taking 100 mg of tramadol 3 times a day for quite some time now. Allows her to be functional. Past medical history is significant for aortic bicuspid valve on chronic anticoagulation patient continues to take Cymbalta and gabapentin as well.  Observations/Objective: Alert and oriented x3. Seems very pleasant.  Patient was somewhat distracted with her fianc recently falling.   Assessment and Plan: 39 year old female with chronic pain secondary to left hip deformity and moderate arthritic changes of the back who is completely functional with the tramadol at a low dose. Unable secondary to social determinants of health including patient's aortic bicuspid valve unable to do anti-inflammatories. Encouraged her to continue the gabapentin at this time, discussed increasing Cymbalta to 40 mg and would like patient to likely decrease tramadol to potentially 150 mg total in the  day and slowly taper down would be more beneficial for her. Patient has done very well in order to get refills as appropriate. Does not appear to have any other prescriptions from any other providers. If continuing narcotics we do need to have a urinalysis in the near future. Patient is in the agreement with the plan social determinants of health also include patient's fianc now has stage IV cancer and patient is the primary caregiver.   Follow Up Instructions: 2 to 3 months    I discussed the assessment and treatment plan with the patient. The patient was provided an opportunity to ask questions and all were answered. The patient agreed with the plan and demonstrated an understanding of the instructions.   The patient was advised to call back or seek an in-person evaluation if the symptoms worsen or if the condition fails to improve as anticipated.  I provided 36 minutes of -face-to-face time during this encounter. Did have some difficulty with visual platform this time included reviewing patient's chart, medical records, and drug refill database.   Lyndal Pulley, DO

## 2020-03-21 ENCOUNTER — Encounter: Payer: Self-pay | Admitting: Family Medicine

## 2020-03-21 ENCOUNTER — Ambulatory Visit (INDEPENDENT_AMBULATORY_CARE_PROVIDER_SITE_OTHER): Payer: Managed Care, Other (non HMO) | Admitting: Family Medicine

## 2020-03-21 DIAGNOSIS — Z954 Presence of other heart-valve replacement: Secondary | ICD-10-CM | POA: Diagnosis not present

## 2020-03-21 DIAGNOSIS — F1190 Opioid use, unspecified, uncomplicated: Secondary | ICD-10-CM

## 2020-03-21 MED ORDER — DULOXETINE HCL 20 MG PO CPEP: 40.0000 mg | ORAL_CAPSULE | Freq: Every day | ORAL | 3 refills | Status: DC

## 2020-03-25 ENCOUNTER — Telehealth: Payer: Self-pay

## 2020-03-25 NOTE — Telephone Encounter (Signed)
Left message for patient to call back to schedule in office visit with Dr. Tamala Julian for 2 months from 03/21/2020.

## 2020-03-29 NOTE — Telephone Encounter (Signed)
Message left to return call to Danielle Obrien at [phone].    

## 2020-03-30 ENCOUNTER — Other Ambulatory Visit: Payer: Self-pay | Admitting: Family Medicine

## 2020-04-02 NOTE — Telephone Encounter (Signed)
Attempted x2 to reach patient in regards to refill. No return call. Routing to K. Sprague, RN.

## 2020-04-05 NOTE — Telephone Encounter (Signed)
Letter to Dr.Silva for review. °

## 2020-04-07 ENCOUNTER — Emergency Department (HOSPITAL_COMMUNITY): Payer: Managed Care, Other (non HMO)

## 2020-04-07 ENCOUNTER — Other Ambulatory Visit: Payer: Self-pay

## 2020-04-07 ENCOUNTER — Encounter (HOSPITAL_COMMUNITY): Payer: Self-pay | Admitting: Emergency Medicine

## 2020-04-07 ENCOUNTER — Emergency Department (HOSPITAL_COMMUNITY)
Admission: EM | Admit: 2020-04-07 | Discharge: 2020-04-07 | Disposition: A | Discharge status: Home / Self Care | Payer: Managed Care, Other (non HMO) | Attending: Emergency Medicine | Admitting: Emergency Medicine

## 2020-04-07 DIAGNOSIS — Z952 Presence of prosthetic heart valve: Secondary | ICD-10-CM | POA: Diagnosis not present

## 2020-04-07 DIAGNOSIS — Y999 Unspecified external cause status: Secondary | ICD-10-CM | POA: Insufficient documentation

## 2020-04-07 DIAGNOSIS — S0011XA Contusion of right eyelid and periocular area, initial encounter: Secondary | ICD-10-CM | POA: Diagnosis not present

## 2020-04-07 DIAGNOSIS — Y92410 Unspecified street and highway as the place of occurrence of the external cause: Secondary | ICD-10-CM | POA: Insufficient documentation

## 2020-04-07 DIAGNOSIS — S0990XA Unspecified injury of head, initial encounter: Secondary | ICD-10-CM | POA: Diagnosis present

## 2020-04-07 DIAGNOSIS — Z7901 Long term (current) use of anticoagulants: Secondary | ICD-10-CM | POA: Insufficient documentation

## 2020-04-07 DIAGNOSIS — Y9389 Activity, other specified: Secondary | ICD-10-CM | POA: Insufficient documentation

## 2020-04-07 DIAGNOSIS — S0083XA Contusion of other part of head, initial encounter: Secondary | ICD-10-CM

## 2020-04-07 LAB — PROTIME-INR
INR: 2.1 — ABNORMAL HIGH (ref 0.8–1.2)
Prothrombin Time: 22.8 seconds — ABNORMAL HIGH (ref 11.4–15.2)

## 2020-04-07 LAB — CBC
HCT: 40.2 % (ref 36.0–46.0)
Hemoglobin: 13.4 g/dL (ref 12.0–15.0)
MCH: 30.7 pg (ref 26.0–34.0)
MCHC: 33.3 g/dL (ref 30.0–36.0)
MCV: 92 fL (ref 80.0–100.0)
Platelets: 128 10*3/uL — ABNORMAL LOW (ref 150–400)
RBC: 4.37 MIL/uL (ref 3.87–5.11)
RDW: 13.2 % (ref 11.5–15.5)
WBC: 6.8 10*3/uL (ref 4.0–10.5)
nRBC: 0 % (ref 0.0–0.2)

## 2020-04-07 MED ORDER — HYDROCODONE-ACETAMINOPHEN 5-325 MG PO TABS
1.0000 | ORAL_TABLET | Freq: Once | ORAL | Status: AC
  Administered 2020-04-07: 10:00:00 1 via ORAL
  Filled 2020-04-07: qty 1

## 2020-04-07 MED ORDER — HYDROCODONE-ACETAMINOPHEN 5-325 MG PO TABS: 1.0000 | ORAL_TABLET | Freq: Four times a day (QID) | ORAL | 0 refills | Status: AC | PRN
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
## 2020-04-07 NOTE — Discharge Instructions (Signed)
Thank you for allowing me to care for you today. Please return to the emergency department if you have new or worsening symptoms. Take your medications as instructed.  ° °

## 2020-04-09 NOTE — Telephone Encounter (Signed)
Letter signed and placed outside your office door.

## 2020-04-09 NOTE — Telephone Encounter (Signed)
Letter mailed to patient's home address on file. Removed from recall. Encounter closed. 

## 2020-04-16 ENCOUNTER — Other Ambulatory Visit: Payer: Self-pay | Admitting: Internal Medicine

## 2020-04-16 ENCOUNTER — Telehealth: Payer: Self-pay | Admitting: Family Medicine

## 2020-04-16 DIAGNOSIS — F411 Generalized anxiety disorder: Secondary | ICD-10-CM

## 2020-04-16 DIAGNOSIS — F3177 Bipolar disorder, in partial remission, most recent episode mixed: Secondary | ICD-10-CM

## 2020-04-17 ENCOUNTER — Encounter: Payer: Self-pay | Admitting: Internal Medicine

## 2020-04-17 ENCOUNTER — Ambulatory Visit (INDEPENDENT_AMBULATORY_CARE_PROVIDER_SITE_OTHER): Payer: Managed Care, Other (non HMO) | Admitting: Internal Medicine

## 2020-04-17 ENCOUNTER — Other Ambulatory Visit: Payer: Self-pay

## 2020-04-17 VITALS — BP 140/84 | HR 72 | Temp 98.2°F | Ht 60.0 in | Wt 118.0 lb

## 2020-04-17 DIAGNOSIS — S0990XD Unspecified injury of head, subsequent encounter: Secondary | ICD-10-CM

## 2020-04-17 DIAGNOSIS — D696 Thrombocytopenia, unspecified: Secondary | ICD-10-CM

## 2020-04-17 DIAGNOSIS — S0990XA Unspecified injury of head, initial encounter: Secondary | ICD-10-CM | POA: Insufficient documentation

## 2020-04-17 DIAGNOSIS — I10 Essential (primary) hypertension: Secondary | ICD-10-CM | POA: Diagnosis not present

## 2020-04-17 DIAGNOSIS — R519 Headache, unspecified: Secondary | ICD-10-CM

## 2020-04-17 DIAGNOSIS — G4489 Other headache syndrome: Secondary | ICD-10-CM

## 2020-04-17 MED ORDER — TRAMADOL HCL 50 MG PO TABS: 100.0000 mg | ORAL_TABLET | Freq: Two times a day (BID) | ORAL | 0 refills | Status: DC
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
## 2020-04-17 NOTE — Patient Instructions (Signed)
[facility] (pp. 2876-2897). [place], [place]. Elsevier."> Neurosurgery, 80(1), 6-15. Retrieved on May 21, 2019.[email or web address]"> Primary Care (5th ed., pp. 218-221). [place], [place]: Elsevier."> Brain Injury, 29(6), 688-700. [email or web address]"> Rosen's Emergency Medicine: Concepts and Clinical Practice (9th ed., pp. 301-329). [place], [place]: Elsevier.">  Head Injury, Adult There are many types of head injuries. Head injuries can be as minor as a bump, or they can be a serious medical issue. More severe head injuries include:  A jarring injury to the brain (concussion).  A bruise (contusion) of the brain. This means there is bleeding in the brain that can cause swelling.  A cracked skull (skull fracture).  Bleeding in the brain that collects, clots, and forms a bump (hematoma). After a head injury, most problems occur within the first 24 hours, but side effects may occur up to 7-10 days after the injury. It is important to watch your condition for any changes. You may need to be observed in the emergency department or urgent care, or you may be admitted to the hospital. What are the causes? There are many possible causes of a head injury. A serious head injury may be caused by a car accident, bicycle or motorcycle accidents, sports injuries, and falls. What are the symptoms? Symptoms of a head injury include a contusion, bump, or bleeding at the site of the injury. Other physical symptoms may include:  Headache.  Nausea or vomiting.  Dizziness.  Feeling tired.  Being uncomfortable around bright lights or loud noises.  Seizures.  Trouble being awakened.  Fainting. Mental or emotional symptoms may include:  Irritability.  Confusion and memory problems.  Poor attention and concentration.  Changes in eating or sleeping habits.  Anxiety or depression. How is this diagnosed? This condition can  usually be diagnosed based on your symptoms, a description of the injury, and a physical exam. You may also have imaging tests done, such as a CT scan or MRI. How is this treated? Treatment for this condition depends on the severity and type of injury you have. The main goal of treatment is to prevent complications and to allow the brain time to heal. Mild head injury If you have a mild head injury, you may be sent home and treatment may include:  Observation. A responsible adult should stay with you for 24 hours after your injury and check on you often.  Physical rest.  Brain rest.  Pain medicines. Severe head injury If you have a severe head injury, treatment may include:  Close observation. This includes hospitalization with frequent physical exams.  Medicines to relieve pain, prevent seizures, and decrease brain swelling.  Breathing support. This may include using a ventilator.  Treatments to manage the swelling inside the brain.  Brain surgery. This may be needed to: ? Remove a blood clot. ? Stop the bleeding. ? Remove a part of the skull to allow room for the brain to swell. Follow these instructions at home: Activity  Rest and avoid activities that are physically hard or tiring.  Make sure you get enough sleep.  Limit activities that require a lot of thought or attention, such as: ? Watching TV. ? Playing memory games and puzzles. ? Job-related work or homework. ? Working on the computer, using social media, and texting.  Avoid activities that could cause another head injury, such as playing sports, until your health care provider approves. Having another head injury, especially before the first one has healed, can be dangerous.  Ask your health care   provider when it is safe for you to return to your regular activities, including work or school. Ask your health care provider for a step-by-step plan for gradually returning to activities.  Ask your health care  provider when you can drive, ride a bicycle, or use heavy machinery. Your ability to react may be slower after a brain injury. Do not do these activities if you are dizzy. Lifestyle   Do not drink alcohol until your health care provider approves. Do not use drugs. Alcohol and certain drugs may slow your recovery and can put you at risk of further injury.  If it is harder than usual to remember things, write them down.  If you are easily distracted, try to do one thing at a time.  Talk with family members or close friends when making important decisions.  Tell your friends, family, a trusted colleague, and work manager about your injury, symptoms, and restrictions. Have them watch for any new or worsening problems. General instructions  Take over-the-counter and prescription medicines only as told by your health care provider.  Have someone stay with you for 24 hours after your head injury. This person should watch you for any changes in your symptoms and be ready to seek medical help.  Keep all follow-up visits as told by your health care provider. This is important. How is this prevented?  Work on improving your balance and strength to avoid falls.  Wear a seatbelt when you are in a moving vehicle.  Wear a helmet when riding a bicycle, skiing, or doing any other sport or activity that has a risk of injury.  If you drink alcohol: ? Limit how much you use to:  0-1 drink a day for women.  0-2 drinks a day for men. ? Be aware of how much alcohol is in your drink. In the [place] one drink equals one 12 oz bottle of beer (355 mL), one 5 oz glass of wine (148 mL), or one 1 oz glass of hard liquor (44 mL).  Take safety measures in your home, such as: ? Removing clutter and tripping hazards from floors and stairways. ? Using grab bars in bathrooms and handrails by stairs. ? Placing non-slip mats on floors and in bathtubs. ? Improving lighting in dim areas. Get help right away  if:  You have: ? A severe headache that is not helped by medicine. ? Trouble walking or weakness in your arms and legs. ? Clear or bloody fluid coming from your nose or ears. ? Changes in your vision. ? A seizure.  You lose your balance.  You vomit.  Your pupils change size.  Your speech is slurred.  Your dizziness gets worse.  You faint.  You are sleepier than normal and have trouble staying awake.  Your symptoms get worse. These symptoms may represent a serious problem that is an emergency. Do not wait to see if the symptoms will go away. Get medical help right away. Call your local emergency services (911 in the [place] Do not drive yourself to the hospital. Summary  Head injuries can be minor or they can be a serious medical issue requiring immediate attention.  Treatment for this condition depends on the severity and type of injury you have.  Ask your health care provider when it is safe for you to return to your regular activities, including work or school.  Head injury prevention includes wearing a seat belt in a motor vehicle, using a helmet on a bicycle, limiting alcohol   use, and taking safety measures in your home. This information is not intended to replace advice given to you by your health care provider. Make sure you discuss any questions you have with your health care provider. Document Revised: 12/14/2018 Document Reviewed: 12/09/2018 Elsevier Patient Education  2020 [employer]  

## 2020-04-18 ENCOUNTER — Ambulatory Visit (INDEPENDENT_AMBULATORY_CARE_PROVIDER_SITE_OTHER): Payer: Managed Care, Other (non HMO) | Admitting: *Deleted

## 2020-04-18 DIAGNOSIS — Z5181 Encounter for therapeutic drug level monitoring: Secondary | ICD-10-CM | POA: Diagnosis not present

## 2020-04-18 DIAGNOSIS — Q231 Congenital insufficiency of aortic valve: Secondary | ICD-10-CM | POA: Diagnosis not present

## 2020-04-18 DIAGNOSIS — Z954 Presence of other heart-valve replacement: Secondary | ICD-10-CM | POA: Diagnosis not present

## 2020-04-18 DIAGNOSIS — Z7901 Long term (current) use of anticoagulants: Secondary | ICD-10-CM | POA: Diagnosis not present

## 2020-04-18 DIAGNOSIS — Q230 Congenital stenosis of aortic valve: Secondary | ICD-10-CM | POA: Diagnosis not present

## 2020-04-18 LAB — POCT INR: INR: 1.6 — AB (ref 2.0–3.0)

## 2020-04-18 NOTE — Patient Instructions (Signed)
Description   Take 1.5 tablets tomorrow, then continue on same dosage 1 tablet daily except 1.5 tablets on Sundays and Thursdays.  Recheck INR in 1 week.   [facility] [phone] Main [phone]

## 2020-04-19 ENCOUNTER — Ambulatory Visit (HOSPITAL_COMMUNITY)
Admission: RE | Admit: 2020-04-19 | Discharge: 2020-04-19 | Disposition: A | Discharge status: Home / Self Care | Payer: Managed Care, Other (non HMO) | Source: Ambulatory Visit | Attending: Internal Medicine | Admitting: Internal Medicine

## 2020-04-19 ENCOUNTER — Other Ambulatory Visit: Payer: Self-pay

## 2020-04-19 DIAGNOSIS — S0990XD Unspecified injury of head, subsequent encounter: Secondary | ICD-10-CM | POA: Diagnosis present

## 2020-04-19 DIAGNOSIS — G4489 Other headache syndrome: Secondary | ICD-10-CM | POA: Diagnosis present

## 2020-04-19 DIAGNOSIS — R519 Headache, unspecified: Secondary | ICD-10-CM | POA: Insufficient documentation

## 2020-04-19 NOTE — Addendum Note (Signed)
Addended by: Aviva Signs M on: 04/19/2020 10:04 AM   Modules accepted: Orders

## 2020-04-23 ENCOUNTER — Telehealth: Payer: Self-pay

## 2020-04-23 ENCOUNTER — Telehealth: Payer: Self-pay | Admitting: Internal Medicine

## 2020-04-23 NOTE — Telephone Encounter (Signed)
New message    Calling for test results  

## 2020-04-23 NOTE — Telephone Encounter (Signed)
Patient coming in Friday for appt.

## 2020-04-23 NOTE — Telephone Encounter (Signed)
Patient is requesting a another refill on the following medication. She states she is still in a lot of pain.  traMADol (ULTRAM) 50 MG tablet  Send RX to [facility] on file.

## 2020-04-23 NOTE — Telephone Encounter (Signed)
Patient called requesting a refill on this medication. She scheduled an appointment in August to see Dr Tamala Julian.

## 2020-04-25 ENCOUNTER — Ambulatory Visit (INDEPENDENT_AMBULATORY_CARE_PROVIDER_SITE_OTHER): Payer: Managed Care, Other (non HMO)

## 2020-04-25 ENCOUNTER — Telehealth: Payer: Self-pay | Admitting: Internal Medicine

## 2020-04-25 ENCOUNTER — Other Ambulatory Visit: Payer: Self-pay

## 2020-04-25 ENCOUNTER — Ambulatory Visit (INDEPENDENT_AMBULATORY_CARE_PROVIDER_SITE_OTHER): Payer: Managed Care, Other (non HMO) | Admitting: Internal Medicine

## 2020-04-25 ENCOUNTER — Encounter: Payer: Self-pay | Admitting: Internal Medicine

## 2020-04-25 ENCOUNTER — Ambulatory Visit (INDEPENDENT_AMBULATORY_CARE_PROVIDER_SITE_OTHER): Payer: Managed Care, Other (non HMO) | Admitting: *Deleted

## 2020-04-25 VITALS — BP 146/80 | HR 68 | Temp 98.3°F | Ht 60.0 in | Wt 121.0 lb

## 2020-04-25 DIAGNOSIS — S060X0D Concussion without loss of consciousness, subsequent encounter: Secondary | ICD-10-CM | POA: Diagnosis not present

## 2020-04-25 DIAGNOSIS — Q231 Congenital insufficiency of aortic valve: Secondary | ICD-10-CM

## 2020-04-25 DIAGNOSIS — Q230 Congenital stenosis of aortic valve: Secondary | ICD-10-CM | POA: Diagnosis not present

## 2020-04-25 DIAGNOSIS — Z954 Presence of other heart-valve replacement: Secondary | ICD-10-CM

## 2020-04-25 DIAGNOSIS — Z5181 Encounter for therapeutic drug level monitoring: Secondary | ICD-10-CM

## 2020-04-25 DIAGNOSIS — R0789 Other chest pain: Secondary | ICD-10-CM | POA: Insufficient documentation

## 2020-04-25 DIAGNOSIS — Z7901 Long term (current) use of anticoagulants: Secondary | ICD-10-CM | POA: Diagnosis not present

## 2020-04-25 DIAGNOSIS — S060X0A Concussion without loss of consciousness, initial encounter: Secondary | ICD-10-CM | POA: Insufficient documentation

## 2020-04-25 LAB — POCT INR: INR: 2.1 (ref 2.0–3.0)

## 2020-04-25 MED ORDER — ONDANSETRON HCL 4 MG PO TABS: 4.0000 mg | ORAL_TABLET | Freq: Three times a day (TID) | ORAL | 0 refills | Status: DC | PRN

## 2020-04-25 NOTE — Patient Instructions (Signed)
Description   Continue on same dosage 1 tablet daily except 1.5 tablets on Sundays and Thursdays.  Recheck INR in 2 weeks. [facility] [phone] Main [phone]

## 2020-04-25 NOTE — Telephone Encounter (Signed)
New message: ° ° °Pt is returning a call for test results. Please advise. °

## 2020-04-25 NOTE — Patient Instructions (Signed)
[facility] (pp. 2876-2897). [place], [place]. Elsevier."> Neurosurgery, 80(1), 6-15. Retrieved on May 21, 2019.[email or web address]"> Primary Care (5th ed., pp. 218-221). [place], [place]: Elsevier."> Brain Injury, 29(6), 688-700. [email or web address]"> Rosen's Emergency Medicine: Concepts and Clinical Practice (9th ed., pp. 301-329). [place], [place]: Elsevier.">  Head Injury, Adult There are many types of head injuries. Head injuries can be as minor as a bump, or they can be a serious medical issue. More severe head injuries include:  A jarring injury to the brain (concussion).  A bruise (contusion) of the brain. This means there is bleeding in the brain that can cause swelling.  A cracked skull (skull fracture).  Bleeding in the brain that collects, clots, and forms a bump (hematoma). After a head injury, most problems occur within the first 24 hours, but side effects may occur up to 7-10 days after the injury. It is important to watch your condition for any changes. You may need to be observed in the emergency department or urgent care, or you may be admitted to the hospital. What are the causes? There are many possible causes of a head injury. A serious head injury may be caused by a car accident, bicycle or motorcycle accidents, sports injuries, and falls. What are the symptoms? Symptoms of a head injury include a contusion, bump, or bleeding at the site of the injury. Other physical symptoms may include:  Headache.  Nausea or vomiting.  Dizziness.  Feeling tired.  Being uncomfortable around bright lights or loud noises.  Seizures.  Trouble being awakened.  Fainting. Mental or emotional symptoms may include:  Irritability.  Confusion and memory problems.  Poor attention and concentration.  Changes in eating or sleeping habits.  Anxiety or depression. How is this diagnosed? This condition can  usually be diagnosed based on your symptoms, a description of the injury, and a physical exam. You may also have imaging tests done, such as a CT scan or MRI. How is this treated? Treatment for this condition depends on the severity and type of injury you have. The main goal of treatment is to prevent complications and to allow the brain time to heal. Mild head injury If you have a mild head injury, you may be sent home and treatment may include:  Observation. A responsible adult should stay with you for 24 hours after your injury and check on you often.  Physical rest.  Brain rest.  Pain medicines. Severe head injury If you have a severe head injury, treatment may include:  Close observation. This includes hospitalization with frequent physical exams.  Medicines to relieve pain, prevent seizures, and decrease brain swelling.  Breathing support. This may include using a ventilator.  Treatments to manage the swelling inside the brain.  Brain surgery. This may be needed to: ? Remove a blood clot. ? Stop the bleeding. ? Remove a part of the skull to allow room for the brain to swell. Follow these instructions at home: Activity  Rest and avoid activities that are physically hard or tiring.  Make sure you get enough sleep.  Limit activities that require a lot of thought or attention, such as: ? Watching TV. ? Playing memory games and puzzles. ? Job-related work or homework. ? Working on the computer, using social media, and texting.  Avoid activities that could cause another head injury, such as playing sports, until your health care provider approves. Having another head injury, especially before the first one has healed, can be dangerous.  Ask your health care   provider when it is safe for you to return to your regular activities, including work or school. Ask your health care provider for a step-by-step plan for gradually returning to activities.  Ask your health care  provider when you can drive, ride a bicycle, or use heavy machinery. Your ability to react may be slower after a brain injury. Do not do these activities if you are dizzy. Lifestyle   Do not drink alcohol until your health care provider approves. Do not use drugs. Alcohol and certain drugs may slow your recovery and can put you at risk of further injury.  If it is harder than usual to remember things, write them down.  If you are easily distracted, try to do one thing at a time.  Talk with family members or close friends when making important decisions.  Tell your friends, family, a trusted colleague, and work manager about your injury, symptoms, and restrictions. Have them watch for any new or worsening problems. General instructions  Take over-the-counter and prescription medicines only as told by your health care provider.  Have someone stay with you for 24 hours after your head injury. This person should watch you for any changes in your symptoms and be ready to seek medical help.  Keep all follow-up visits as told by your health care provider. This is important. How is this prevented?  Work on improving your balance and strength to avoid falls.  Wear a seatbelt when you are in a moving vehicle.  Wear a helmet when riding a bicycle, skiing, or doing any other sport or activity that has a risk of injury.  If you drink alcohol: ? Limit how much you use to:  0-1 drink a day for women.  0-2 drinks a day for men. ? Be aware of how much alcohol is in your drink. In the [place] one drink equals one 12 oz bottle of beer (355 mL), one 5 oz glass of wine (148 mL), or one 1 oz glass of hard liquor (44 mL).  Take safety measures in your home, such as: ? Removing clutter and tripping hazards from floors and stairways. ? Using grab bars in bathrooms and handrails by stairs. ? Placing non-slip mats on floors and in bathtubs. ? Improving lighting in dim areas. Get help right away  if:  You have: ? A severe headache that is not helped by medicine. ? Trouble walking or weakness in your arms and legs. ? Clear or bloody fluid coming from your nose or ears. ? Changes in your vision. ? A seizure.  You lose your balance.  You vomit.  Your pupils change size.  Your speech is slurred.  Your dizziness gets worse.  You faint.  You are sleepier than normal and have trouble staying awake.  Your symptoms get worse. These symptoms may represent a serious problem that is an emergency. Do not wait to see if the symptoms will go away. Get medical help right away. Call your local emergency services (911 in the [place] Do not drive yourself to the hospital. Summary  Head injuries can be minor or they can be a serious medical issue requiring immediate attention.  Treatment for this condition depends on the severity and type of injury you have.  Ask your health care provider when it is safe for you to return to your regular activities, including work or school.  Head injury prevention includes wearing a seat belt in a motor vehicle, using a helmet on a bicycle, limiting alcohol   use, and taking safety measures in your home. This information is not intended to replace advice given to you by your health care provider. Make sure you discuss any questions you have with your health care provider. Document Revised: 12/14/2018 Document Reviewed: 12/09/2018 Elsevier Patient Education  2020 [employer]  

## 2020-04-25 NOTE — Telephone Encounter (Signed)
Error
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
## 2020-04-26 ENCOUNTER — Ambulatory Visit: Payer: Managed Care, Other (non HMO) | Admitting: Family Medicine

## 2020-04-26 ENCOUNTER — Other Ambulatory Visit: Payer: Managed Care, Other (non HMO)

## 2020-04-26 ENCOUNTER — Encounter: Payer: Self-pay | Admitting: Family Medicine

## 2020-04-26 VITALS — BP 124/80 | HR 71 | Ht 60.0 in | Wt 124.0 lb

## 2020-04-26 DIAGNOSIS — F1190 Opioid use, unspecified, uncomplicated: Secondary | ICD-10-CM

## 2020-04-26 DIAGNOSIS — M2550 Pain in unspecified joint: Secondary | ICD-10-CM

## 2020-04-26 DIAGNOSIS — J440 Chronic obstructive pulmonary disease with acute lower respiratory infection: Secondary | ICD-10-CM

## 2020-04-26 DIAGNOSIS — F3177 Bipolar disorder, in partial remission, most recent episode mixed: Secondary | ICD-10-CM

## 2020-04-26 DIAGNOSIS — Z7901 Long term (current) use of anticoagulants: Secondary | ICD-10-CM | POA: Diagnosis not present

## 2020-04-26 DIAGNOSIS — J209 Acute bronchitis, unspecified: Secondary | ICD-10-CM

## 2020-04-26 MED ORDER — TRAMADOL HCL 50 MG PO TABS: 50.0000 mg | ORAL_TABLET | Freq: Four times a day (QID) | ORAL | 0 refills | Status: DC | PRN

## 2020-04-26 MED ORDER — METHYLPREDNISOLONE ACETATE 80 MG/ML IJ SUSP
80.0000 mg | Freq: Once | INTRAMUSCULAR | Status: AC
  Administered 2020-04-26: 80 mg via INTRAMUSCULAR

## 2020-04-26 MED ORDER — KETOROLAC TROMETHAMINE 60 MG/2ML IM SOLN
60.0000 mg | Freq: Once | INTRAMUSCULAR | Status: AC
  Administered 2020-04-26: 60 mg via INTRAMUSCULAR

## 2020-04-26 NOTE — Progress Notes (Signed)
[place] Phone: [phone] Subjective:   Danielle Obrien, am serving as a scribe for Dr. Hulan Saas. This visit occurred during the SARS-CoV-2 public health emergency.  Safety protocols were in place, including screening questions prior to the visit, additional usage of staff PPE, and extensive cleaning of exam room while observing appropriate contact time as indicated for disinfecting solutions.   I'm seeing this patient by the request  of:  Janith Lima, MD  CC: Chronic pain follow-up  [id number]   03/21/2020 39 year old female with chronic pain secondary to left hip deformity and moderate arthritic changes of the back who is completely functional with the tramadol at a low dose. Unable secondary to social determinants of health including patient's aortic bicuspid valve unable to do anti-inflammatories. Encouraged her to continue the gabapentin at this time, discussed increasing Cymbalta to 40 mg and would like patient to likely decrease tramadol to potentially 150 mg total in the day and slowly taper down would be more beneficial for her. Patient has done very well in order to get refills as appropriate. Does not appear to have any other prescriptions from any other providers. If continuing narcotics we do need to have a urinalysis in the near future. Patient is in the agreement with the plan social determinants of health also include patient's fianc now has stage IV cancer and patient is the primary caregiver.  Update 04/26/2020 Danielle Obrien is a 39 y.o. female coming in with complaint of chronic low back pain. Patient states that her hips and back. Was in [place] 3 weeks ago. Pain has been progressively getting worse.   Also seen yesterday for head injury. Has been having intermittent headaches and feeling very dizzy since accident. Does have large hematoma on right frontal bone. Not returning to work until  July 5th.  Patient is unable to take anti-inflammatory secondary to patient having a bicuspid valve replacement.      Past Medical History:  Diagnosis Date  . Abnormal Pap smear of cervix    --recurrent ascus w/Pos. HR HPV  . ADD (attention deficit disorder)   . Alcohol abuse, in remission 2012  . Allergy   . Anxiety   . Aortic stenosis due to bicuspid aortic valve 02/20/2014  . Arthritis    In hips  . Asthma    rare inhaler use  . Bipolar disorder ([place])   . Headache   . Hyperlipidemia with target LDL less than 130 01/27/2013  . Hypertension   . Muscle spasms of both lower extremities    both hips  . 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 Tobacco abuse 09/27/2015  . 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 ABDOMINAL HYSTERECTOMY  02/06/2009   Robotic total laparoscopic hysterectomy  . ANTERIOR CRUCIATE LIGAMENT REPAIR  1993  . AORTIC VALVE REPLACEMENT N/A 11/03/2017   Procedure: MINIMALLY INVASIVE AORTIC VALVE REPLACEMENT( MINI THORACOTOMY);  Surgeon: Rexene Alberts, MD;  Location: [facility];  Service: Open Heart Surgery;  Laterality: N/A;  . CARDIAC CATHETERIZATION  June 2015   Obrien CAD - moderate AS noted  . 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  COLPOSCOPY  10/2008   CIN I & II  . COLPOSCOPY  07/2000   Neg. ECC  . COLPOSCOPY  06/2001   CIN I  . COLPOSCOPY  08/2004   ECC--atypia  . COLPOSCOPY N/A 01/21/2016   Procedure: COLPOSCOPY with vaginal biopsy with CO 2 Laser of Vaginal and vulvar condyloma;  Surgeon: [name], MD;  Location: [facility];  Service: Gynecology;  Laterality: N/A;  [name] will be here 2/21 for 1115 case confirmed 01/16/15 - TS  . HERNIA REPAIR   1981/1982  . HIP SURGERY  1981   Hip Reset   . HIP SURGERY  1990   Plate was reconstructed/ took out growth plate in Right knee  . HIP SURGERY  1992   Plate removed in Left hip  . LEFT AND RIGHT HEART CATHETERIZATION WITH CORONARY ANGIOGRAM N/A 05/18/2014   Procedure: LEFT AND RIGHT HEART CATHETERIZATION WITH CORONARY ANGIOGRAM;  Surgeon: Jettie Booze, MD;  Location: [facility] CATH LAB;  Service: Cardiovascular;  Laterality: N/A;  . LYMPH NODE BIOPSY  1995  . NASAL SEPTUM SURGERY  2002  . TEE WITHOUT CARDIOVERSION N/A 11/03/2017   Procedure: TRANSESOPHAGEAL ECHOCARDIOGRAM (TEE);  Surgeon: Rexene Alberts, MD;  Location: [facility];  Service: Open Heart Surgery;  Laterality: N/A;  . TONSILLECTOMY AND ADENOIDECTOMY  1990  . TYMPANOSTOMY TUBE PLACEMENT  1981/1982   Social History   Socioeconomic History  . Marital status: Single    Spouse name: Not on file  . Number of children: Not on file  . Years of education: Not on file  . Highest education level: Not on file  Occupational History  . Not on file  Tobacco Use  . Smoking status: Former Smoker    Packs/day: 0.50    Years: 12.00    Pack years: 6.00    Types: Cigarettes    Quit date: 11/02/2017    Years since quitting: 2.4  . Smokeless tobacco: Never Used  . Tobacco comment: pt given fake cigarette for hand/mouth association. pt using [facility] for support  Substance and Sexual Activity  . Alcohol use: Obrien    Alcohol/week: 0.0 standard drinks  . Drug use: Obrien  . Sexual activity: Yes    Partners: Male    Birth control/protection: Surgical    Comment: Hysterectomy  Other Topics Concern  . Not on file  Social History Narrative  . Not on file   Social Determinants of Health   Financial Resource Strain:   . Difficulty of Paying Living Expenses:   Food Insecurity:   . Worried About Charity fundraiser in the Last Year:   . Arboriculturist in the Last Year:   Transportation Needs:   . Film/video editor (Medical):    Marland Kitchen Lack of Transportation (Non-Medical):   Physical Activity:   . Days of Exercise per Week:   . Minutes of Exercise per Session:   Stress:   . Feeling of Stress :   Social Connections:   . Frequency of Communication with Friends and Family:   . Frequency of Social Gatherings with Friends and Family:   . Attends Religious Services:   . Active Member of Clubs or Organizations:   . Attends Archivist Meetings:   Marland Kitchen Marital Status:    Allergies  Allergen Reactions  . Demerol Nausea And Vomiting  . Ibuprofen Other (See Comments)    Gastritis   . Codeine Itching   Family History  Problem Relation  Age of Onset  . Hyperlipidemia Mother   . Hypertension Mother   . Thyroid disease Mother   . Hyperlipidemia Father   . Hypertension Father   . Migraines Father   . Hypertension Brother   . Hyperlipidemia Brother   . Thyroid disease Maternal Grandmother   . Thyroid disease Maternal Grandfather   . Cancer Neg Hx      Current Outpatient Medications (Cardiovascular):  .  metoprolol tartrate (LOPRESSOR) 25 MG tablet, Take 0.5 tablets (12.5 mg total) by mouth 2 (two) times daily. Pt must make appt with provider to get further refills - 2nd attempt (Patient taking differently: Take 12.5 mg by mouth 2 (two) times daily. )  Current Outpatient Medications (Respiratory):  .  albuterol (PROVENTIL HFA;VENTOLIN HFA) 108 (90 Base) MCG/ACT inhaler, Inhale 2 puffs into the lungs every 6 (six) hours as needed for wheezing or shortness of breath.  Current Outpatient Medications (Analgesics):  .  traMADol (ULTRAM) 50 MG tablet, Take 1 tablet (50 mg total) by mouth every 6 (six) hours as needed for up to 5 days.  Current Outpatient Medications (Hematological):  .  warfarin (COUMADIN) 7.5 MG tablet, Take 1 to 1.5 tablets daily as directed by Coumadin clinic. (Patient taking differently: Take 7.5-11.25 mg by mouth See admin instructions. Take 1 1/2 tablets by mouth on Thursday and Sundays. All  other days take 1 tablet.)  Current Outpatient Medications (Other):  Marland Kitchen  DULoxetine (CYMBALTA) 20 MG capsule, TAKE ONE CAPSULE BY MOUTH DAILY (Patient taking differently: Take 20 mg by mouth daily. ) .  gabapentin (NEURONTIN) 300 MG capsule, TAKE TWO CAPSULES BY MOUTH THREE TIMES A DAY (Patient taking differently: Take 600 mg by mouth 3 (three) times daily. ) .  lamoTRIgine (LAMICTAL) 200 MG tablet, TAKE TWO TABLETS BY MOUTH DAILY .  lansoprazole (PREVACID) 15 MG capsule, Take 15 mg by mouth daily as needed (for heartburn or acid reflux).  .  ondansetron (ZOFRAN) 4 MG tablet, Take 1 tablet (4 mg total) by mouth every 8 (eight) hours as needed for nausea or vomiting.   Reviewed prior external information including notes and imaging from  primary care provider As well as notes that were available from care everywhere and other healthcare systems.  Past medical history, social, surgical and family history all reviewed in electronic medical record.  Obrien pertanent information unless stated regarding to the chief complaint.   Review of Systems:  Obrien , visual changes, nausea, vomiting, diarrhea, constipation, dizziness, abdominal pain, skin rash, fevers, chills, night sweats, weight loss, swollen lymph nodes,  joint swelling, chest pain, shortness of breath, mood changes. POSITIVE muscle aches, body aches, headaches, joint pain  Objective  Blood pressure 124/80, pulse 71, height 5' (1.524 m), weight 124 lb (56.2 kg), SpO2 99 %.   General: Obrien apparent distress alert and oriented x3 mood and affect normal, dressed appropriately.  Patient does have resolving bruising on the face.  Large hematoma on the right scalp. HEENT: Pupils equal, extraocular movements intact  Respiratory: Patient's speak in full sentences and does not appear short of breath  Cardiovascular: Obrien lower extremity edema, non tender, Obrien erythema  Neuro: Cranial nerves II through XII are intact, neurovascularly intact in all extremities  with 2+ DTRs and 2+ pulses.  Gait mild antalgic Patient is tender to palpation in the back.  Mild limited range of motion in all planes.  Patient has instability of the left ankle noted.  Continued pain in the sacroiliac joint bilaterally.  Mild radicular  symptoms.  Reviewed patient's narcotic dispensary.  Obrien abnormal medication refills.  Patient also recently had an MRI of the brain only thing remarkable was the scalp hematoma.   Impression and Recommendations:     This case required medical decision making of moderate complexity. The above documentation has been reviewed and is accurate and complete Lyndal Pulley, DO       Note: This dictation was prepared with Dragon dictation along with smaller phrase technology. Any transcriptional errors that result from this process are unintentional.
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
## 2020-04-26 NOTE — Patient Instructions (Addendum)
Tramadol 50 mg every 6 hours 120 tabs Injections in back side today Lab today See me again in 5 weeks

## 2020-04-26 NOTE — Assessment & Plan Note (Addendum)
Tramadol refilled today, total on now for 200 mg daily.  Would likely did not decrease to 150 mg and will increase Cymbalta in the long run.  Patient's well will take gabapentin as well to relieve the some of the symptoms.  Toradol and Depo-Medrol given today for the chronic pain.  Once again unable to do anti-inflammatories regularly though secondary to aortic stenosis and is on Coumadin.  Follow-up again in 3 months  Called patient to see how patient is feeling.  Did want to check with patient having a Toradol level but secondary to the pain I did think she needed something.  Patient though is on the Coumadin.  I do believe patient will do well otherwise.  Patient was in agreement and needed something for pain and did not feel comfortable going up on the narcotics. Discussed at office visit to hold coumadin one day

## 2020-04-26 NOTE — Assessment & Plan Note (Signed)
Patient is on anticoagulants which make it hard for anything else such as anti-inflammatories to help patient's pain level at this time.  Discussed icing regimen, home exercise, which activities to do which wants to avoid.  Patient signed pain contract today and understands, UDS pending, follow-up in office or virtually every 3 to 6 months patient is in agreement with the plan

## 2020-04-29 ENCOUNTER — Other Ambulatory Visit: Payer: Self-pay | Admitting: Family Medicine

## 2020-04-30 LAB — DRUG MONITORING, PANEL 8 WITH CONFIRMATION, URINE
6 Acetylmorphine: NEGATIVE ng/mL (ref <10)
Alcohol Metabolites: NEGATIVE ng/mL
Amphetamines: NEGATIVE ng/mL (ref <500)
Benzodiazepines: NEGATIVE ng/mL (ref <100)
Buprenorphine, Urine: NEGATIVE ng/mL (ref <5)
Cocaine Metabolite: NEGATIVE ng/mL (ref <150)
Creatinine: 8.8 mg/dL
MDMA: NEGATIVE ng/mL (ref <500)
Marijuana Metabolite: 54 ng/mL — ABNORMAL HIGH (ref <5)
Marijuana Metabolite: POSITIVE ng/mL — AB (ref <20)
Opiates: NEGATIVE ng/mL (ref <100)
Oxidant: NEGATIVE ug/mL
Oxycodone: NEGATIVE ng/mL (ref <100)
Specific Gravity: 1.001 — ABNORMAL LOW (ref >=1.0)
pH: 7.1 (ref 4.5–9.0)

## 2020-04-30 LAB — DM TEMPLATE

## 2020-05-02 ENCOUNTER — Ambulatory Visit (INDEPENDENT_AMBULATORY_CARE_PROVIDER_SITE_OTHER): Payer: Managed Care, Other (non HMO) | Admitting: Family Medicine

## 2020-05-02 ENCOUNTER — Telehealth: Payer: Self-pay | Admitting: Family Medicine

## 2020-05-02 ENCOUNTER — Telehealth: Payer: Self-pay | Admitting: Internal Medicine

## 2020-05-02 DIAGNOSIS — S0990XD Unspecified injury of head, subsequent encounter: Secondary | ICD-10-CM

## 2020-05-02 NOTE — Telephone Encounter (Signed)
Phone f/u visit scheduled for today with Dr. Tamala Julian
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
## 2020-05-03 ENCOUNTER — Encounter: Payer: Self-pay | Admitting: Family Medicine

## 2020-05-03 ENCOUNTER — Other Ambulatory Visit: Payer: Self-pay | Admitting: Cardiology

## 2020-05-03 DIAGNOSIS — Z0279 Encounter for issue of other medical certificate: Secondary | ICD-10-CM

## 2020-05-03 NOTE — Telephone Encounter (Signed)
Forms have been signed, Copy sent to scan &Charged for.  Patient informed forms are ready to be picked up.

## 2020-05-09 ENCOUNTER — Ambulatory Visit (INDEPENDENT_AMBULATORY_CARE_PROVIDER_SITE_OTHER): Payer: Managed Care, Other (non HMO) | Admitting: *Deleted

## 2020-05-09 ENCOUNTER — Other Ambulatory Visit: Payer: Self-pay

## 2020-05-09 DIAGNOSIS — Z7901 Long term (current) use of anticoagulants: Secondary | ICD-10-CM

## 2020-05-09 DIAGNOSIS — Q230 Congenital stenosis of aortic valve: Secondary | ICD-10-CM | POA: Diagnosis not present

## 2020-05-09 DIAGNOSIS — Q231 Congenital insufficiency of aortic valve: Secondary | ICD-10-CM

## 2020-05-09 DIAGNOSIS — Z954 Presence of other heart-valve replacement: Secondary | ICD-10-CM

## 2020-05-09 LAB — POCT INR: INR: 1.8 — AB (ref 2.0–3.0)

## 2020-05-09 NOTE — Patient Instructions (Addendum)
Description   Today take 2 tablets of Warfarin then continue on same dosage 1 tablet daily except 1.5 tablets on Sundays and Thursdays. Recheck INR in 1 week. [facility] [phone] Main [phone]

## 2020-05-10 ENCOUNTER — Ambulatory Visit (INDEPENDENT_AMBULATORY_CARE_PROVIDER_SITE_OTHER): Payer: Managed Care, Other (non HMO) | Admitting: Family Medicine

## 2020-05-10 ENCOUNTER — Encounter: Payer: Self-pay | Admitting: Family Medicine

## 2020-05-10 DIAGNOSIS — S060X0D Concussion without loss of consciousness, subsequent encounter: Secondary | ICD-10-CM

## 2020-05-10 DIAGNOSIS — S0990XD Unspecified injury of head, subsequent encounter: Secondary | ICD-10-CM
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
## 2020-05-10 NOTE — Assessment & Plan Note (Signed)
Patient is making progress at this time.  Patient states that she is feeling much better but still not at her baseline.  Continuing to have difficulty with the vestibular neural aspect of the concussion and we will refer her to physical therapy for this.  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  Patient is still held out of work until 1 July at the moment.  Discussed with patient to continue everything else at this time and follow-up with me again 2 more weeks virtually.

## 2020-05-16 ENCOUNTER — Ambulatory Visit (INDEPENDENT_AMBULATORY_CARE_PROVIDER_SITE_OTHER): Payer: Managed Care, Other (non HMO) | Admitting: *Deleted

## 2020-05-16 ENCOUNTER — Other Ambulatory Visit: Payer: Self-pay

## 2020-05-16 DIAGNOSIS — Q230 Congenital stenosis of aortic valve: Secondary | ICD-10-CM

## 2020-05-16 DIAGNOSIS — Z7901 Long term (current) use of anticoagulants: Secondary | ICD-10-CM | POA: Diagnosis not present

## 2020-05-16 DIAGNOSIS — Q231 Congenital insufficiency of aortic valve: Secondary | ICD-10-CM | POA: Diagnosis not present

## 2020-05-16 DIAGNOSIS — Z954 Presence of other heart-valve replacement: Secondary | ICD-10-CM

## 2020-05-16 LAB — POCT INR: INR: 2.3 (ref 2.0–3.0)

## 2020-05-16 NOTE — Patient Instructions (Signed)
Description   Continue taking 1 tablet daily except 1.5 tablets on Sundays and Thursdays. Recheck INR in 2 weeks. [facility] [phone] Main [phone]

## 2020-05-20 ENCOUNTER — Telehealth: Payer: Self-pay
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
## 2020-05-20 NOTE — Telephone Encounter (Signed)
Patient called stating her face and head is "killing her" and she is still really nauseous. Would like a call back to discuss.

## 2020-05-20 NOTE — Telephone Encounter (Signed)
Also recommend to patient if symptoms worsen today or tonight that she go into ED. Patient voices understanding and states that she will be fine until her appointment tomorrow with Dr. Tamala Julian.

## 2020-05-21 ENCOUNTER — Encounter: Payer: Self-pay | Admitting: Family Medicine

## 2020-05-21 ENCOUNTER — Telehealth: Payer: Self-pay

## 2020-05-21 ENCOUNTER — Ambulatory Visit (INDEPENDENT_AMBULATORY_CARE_PROVIDER_SITE_OTHER): Payer: Managed Care, Other (non HMO) | Admitting: Family Medicine

## 2020-05-21 ENCOUNTER — Other Ambulatory Visit: Payer: Self-pay | Admitting: Family Medicine

## 2020-05-21 DIAGNOSIS — G43001 Migraine without aura, not intractable, with status migrainosus: Secondary | ICD-10-CM | POA: Diagnosis not present

## 2020-05-21 DIAGNOSIS — M542 Cervicalgia: Secondary | ICD-10-CM

## 2020-05-21 DIAGNOSIS — S060X0D Concussion without loss of consciousness, subsequent encounter: Secondary | ICD-10-CM | POA: Diagnosis not present

## 2020-05-21 DIAGNOSIS — M5030 Other cervical disc degeneration, unspecified cervical region: Secondary | ICD-10-CM
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
## 2020-05-21 NOTE — Assessment & Plan Note (Signed)
Worsening neck pain with patient having intermittent radicular symptoms.  Concern for potential instability.  MRI ordered today.  Also will rule out any cerebellar ectopia.  Patient is on also blood thinners follow-up again after imaging

## 2020-05-21 NOTE — Telephone Encounter (Signed)
Left patient a message with phone number for [facility]. Advised she give them a call to schedule.

## 2020-05-23 ENCOUNTER — Ambulatory Visit (INDEPENDENT_AMBULATORY_CARE_PROVIDER_SITE_OTHER): Payer: Managed Care, Other (non HMO) | Admitting: Internal Medicine

## 2020-05-23 ENCOUNTER — Encounter: Payer: Self-pay | Admitting: Internal Medicine

## 2020-05-23 ENCOUNTER — Other Ambulatory Visit: Payer: Self-pay

## 2020-05-23 VITALS — BP 134/84 | HR 79 | Temp 97.8°F | Resp 16 | Ht 60.0 in | Wt 116.0 lb

## 2020-05-23 DIAGNOSIS — Z0000 Encounter for general adult medical examination without abnormal findings: Secondary | ICD-10-CM | POA: Diagnosis not present

## 2020-05-23 DIAGNOSIS — S060X0D Concussion without loss of consciousness, subsequent encounter: Secondary | ICD-10-CM

## 2020-05-23 DIAGNOSIS — Z1231 Encounter for screening mammogram for malignant neoplasm of breast: Secondary | ICD-10-CM

## 2020-05-23 DIAGNOSIS — I10 Essential (primary) hypertension: Secondary | ICD-10-CM | POA: Diagnosis not present

## 2020-05-23 DIAGNOSIS — E785 Hyperlipidemia, unspecified: Secondary | ICD-10-CM

## 2020-05-23 DIAGNOSIS — D696 Thrombocytopenia, unspecified: Secondary | ICD-10-CM

## 2020-05-23 LAB — CBC WITH DIFFERENTIAL/PLATELET
Basophils Absolute: 0.1 10*3/uL (ref 0.0–0.1)
Basophils Relative: 1.2 % (ref 0.0–3.0)
Eosinophils Absolute: 0.2 10*3/uL (ref 0.0–0.7)
Eosinophils Relative: 3 % (ref 0.0–5.0)
HCT: 46.2 % — ABNORMAL HIGH (ref 36.0–46.0)
Hemoglobin: 15.5 g/dL — ABNORMAL HIGH (ref 12.0–15.0)
Lymphocytes Relative: 32.8 % (ref 12.0–46.0)
Lymphs Abs: 1.8 10*3/uL (ref 0.7–4.0)
MCHC: 33.6 g/dL (ref 30.0–36.0)
MCV: 94.2 fl (ref 78.0–100.0)
Monocytes Absolute: 0.5 10*3/uL (ref 0.1–1.0)
Monocytes Relative: 9.3 % (ref 3.0–12.0)
Neutro Abs: 2.9 10*3/uL (ref 1.4–7.7)
Neutrophils Relative %: 53.7 % (ref 43.0–77.0)
Platelets: 161 10*3/uL (ref 150.0–400.0)
RBC: 4.9 Mil/uL (ref 3.87–5.11)
RDW: 14.3 % (ref 11.5–15.5)
WBC: 5.5 10*3/uL (ref 4.0–10.5)

## 2020-05-23 LAB — LIPID PANEL
Cholesterol: 277 mg/dL — ABNORMAL HIGH (ref 0–200)
HDL: 60.9 mg/dL (ref >39.00)
LDL Cholesterol: 199 mg/dL — ABNORMAL HIGH (ref 0–99)
NonHDL: 215.62
Total CHOL/HDL Ratio: 5
Triglycerides: 82 mg/dL (ref 0.0–149.0)
VLDL: 16.4 mg/dL (ref 0.0–40.0)

## 2020-05-23 LAB — FOLATE: Folate: 10.6 ng/mL (ref >5.9)

## 2020-05-23 LAB — HEPATIC FUNCTION PANEL
ALT: 14 U/L (ref 0–35)
AST: 22 U/L (ref 0–37)
Albumin: 4.5 g/dL (ref 3.5–5.2)
Alkaline Phosphatase: 96 U/L (ref 39–117)
Bilirubin, Direct: 0.1 mg/dL (ref 0.0–0.3)
Total Bilirubin: 0.4 mg/dL (ref 0.2–1.2)
Total Protein: 6.9 g/dL (ref 6.0–8.3)

## 2020-05-23 LAB — VITAMIN B12: Vitamin B-12: 252 pg/mL (ref 211–911)

## 2020-05-23 LAB — TSH: TSH: 0.3 u[IU]/mL — ABNORMAL LOW (ref 0.35–4.50)

## 2020-05-23 MED ORDER — PRAVASTATIN SODIUM 20 MG PO TABS: 20.0000 mg | ORAL_TABLET | Freq: Every day | ORAL | 1 refills | Status: DC
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
## 2020-05-23 NOTE — Patient Instructions (Signed)
Health Maintenance, Female Adopting a healthy lifestyle and getting preventive care are important in promoting health and wellness. Ask your health care provider about:  The right schedule for you to have regular tests and exams.  Things you can do on your own to prevent diseases and keep yourself healthy. What should I know about diet, weight, and exercise? Eat a healthy diet   Eat a diet that includes plenty of vegetables, fruits, low-fat dairy products, and lean protein.  Do not eat a lot of foods that are high in solid fats, added sugars, or sodium. Maintain a healthy weight Body mass index (BMI) is used to identify weight problems. It estimates body fat based on height and weight. Your health care provider can help determine your BMI and help you achieve or maintain a healthy weight. Get regular exercise Get regular exercise. This is one of the most important things you can do for your health. Most adults should:  Exercise for at least 150 minutes each week. The exercise should increase your heart rate and make you sweat (moderate-intensity exercise).  Do strengthening exercises at least twice a week. This is in addition to the moderate-intensity exercise.  Spend less time sitting. Even light physical activity can be beneficial. Watch cholesterol and blood lipids Have your blood tested for lipids and cholesterol at 40 years of age, then have this test every 5 years. Have your cholesterol levels checked more often if:  Your lipid or cholesterol levels are high.  You are older than 40 years of age.  You are at high risk for heart disease. What should I know about cancer screening? Depending on your health history and family history, you may need to have cancer screening at various ages. This may include screening for:  Breast cancer.  Cervical cancer.  Colorectal cancer.  Skin cancer.  Lung cancer. What should I know about heart disease, diabetes, and high blood  pressure? Blood pressure and heart disease  High blood pressure causes heart disease and increases the risk of stroke. This is more likely to develop in people who have high blood pressure readings, are of African descent, or are overweight.  Have your blood pressure checked: ? Every 3-5 years if you are 18-39 years of age. ? Every year if you are 40 years old or older. Diabetes Have regular diabetes screenings. This checks your fasting blood sugar level. Have the screening done:  Once every three years after age 40 if you are at a normal weight and have a low risk for diabetes.  More often and at a younger age if you are overweight or have a high risk for diabetes. What should I know about preventing infection? Hepatitis B If you have a higher risk for hepatitis B, you should be screened for this virus. Talk with your health care provider to find out if you are at risk for hepatitis B infection. Hepatitis C Testing is recommended for:  Everyone born from 1945 through 1965.  Anyone with known risk factors for hepatitis C. Sexually transmitted infections (STIs)  Get screened for STIs, including gonorrhea and chlamydia, if: ? You are sexually active and are younger than 40 years of age. ? You are older than 40 years of age and your health care provider tells you that you are at risk for this type of infection. ? Your sexual activity has changed since you were last screened, and you are at increased risk for chlamydia or gonorrhea. Ask your health care provider if   you are at risk.  Ask your health care provider about whether you are at high risk for HIV. Your health care provider may recommend a prescription medicine to help prevent HIV infection. If you choose to take medicine to prevent HIV, you should first get tested for HIV. You should then be tested every 3 months for as long as you are taking the medicine. Pregnancy  If you are about to stop having your period (premenopausal) and  you may become pregnant, seek counseling before you get pregnant.  Take 400 to 800 micrograms (mcg) of folic acid every day if you become pregnant.  Ask for birth control (contraception) if you want to prevent pregnancy. Osteoporosis and menopause Osteoporosis is a disease in which the bones lose minerals and strength with aging. This can result in bone fractures. If you are 65 years old or older, or if you are at risk for osteoporosis and fractures, ask your health care provider if you should:  Be screened for bone loss.  Take a calcium or vitamin D supplement to lower your risk of fractures.  Be given hormone replacement therapy (HRT) to treat symptoms of menopause. Follow these instructions at home: Lifestyle  Do not use any products that contain nicotine or tobacco, such as cigarettes, e-cigarettes, and chewing tobacco. If you need help quitting, ask your health care provider.  Do not use street drugs.  Do not share needles.  Ask your health care provider for help if you need support or information about quitting drugs. Alcohol use  Do not drink alcohol if: ? Your health care provider tells you not to drink. ? You are pregnant, may be pregnant, or are planning to become pregnant.  If you drink alcohol: ? Limit how much you use to 0-1 drink a day. ? Limit intake if you are breastfeeding.  Be aware of how much alcohol is in your drink. In the [place] one drink equals one 12 oz bottle of beer (355 mL), one 5 oz glass of wine (148 mL), or one 1 oz glass of hard liquor (44 mL). General instructions  Schedule regular health, dental, and eye exams.  Stay current with your vaccines.  Tell your health care provider if: ? You often feel depressed. ? You have ever been abused or do not feel safe at home. Summary  Adopting a healthy lifestyle and getting preventive care are important in promoting health and wellness.  Follow your health care provider's instructions about healthy  diet, exercising, and getting tested or screened for diseases.  Follow your health care provider's instructions on monitoring your cholesterol and blood pressure. This information is not intended to replace advice given to you by your health care provider. Make sure you discuss any questions you have with your health care provider. Document Revised: 11/09/2018 Document Reviewed: 11/09/2018 Elsevier Patient Education  2020 Elsevier Inc.  

## 2020-05-23 NOTE — Telephone Encounter (Signed)
Call made to [employer] - They are refaxing the forms.   LVM for pt informing of same.

## 2020-05-24 ENCOUNTER — Ambulatory Visit: Payer: Managed Care, Other (non HMO) | Admitting: Family Medicine

## 2020-05-24 NOTE — Telephone Encounter (Signed)
    Patient calling to verify of forms have been received from [facility]

## 2020-05-27 NOTE — Telephone Encounter (Signed)
I do not have these forms, Do you?

## 2020-05-27 NOTE — Telephone Encounter (Signed)
I have not seen them come in yet. I have been through his box Friday and today.

## 2020-05-29 ENCOUNTER — Other Ambulatory Visit: Payer: Self-pay | Admitting: Cardiology

## 2020-05-29 NOTE — Telephone Encounter (Signed)
Forms have been completed &Placed in provider box to review and sign.

## 2020-05-29 NOTE — Telephone Encounter (Signed)
Spoke with Christella Scheuermann, They have emailed me the forms. Forms have been printed and started working on them.

## 2020-05-30 ENCOUNTER — Ambulatory Visit (INDEPENDENT_AMBULATORY_CARE_PROVIDER_SITE_OTHER): Payer: Managed Care, Other (non HMO) | Admitting: *Deleted

## 2020-05-30 ENCOUNTER — Encounter: Payer: Self-pay | Admitting: Family Medicine

## 2020-05-30 ENCOUNTER — Other Ambulatory Visit: Payer: Self-pay

## 2020-05-30 ENCOUNTER — Ambulatory Visit: Payer: Managed Care, Other (non HMO) | Admitting: Family Medicine

## 2020-05-30 ENCOUNTER — Ambulatory Visit (INDEPENDENT_AMBULATORY_CARE_PROVIDER_SITE_OTHER): Payer: Managed Care, Other (non HMO) | Admitting: Family Medicine

## 2020-05-30 DIAGNOSIS — Z954 Presence of other heart-valve replacement: Secondary | ICD-10-CM

## 2020-05-30 DIAGNOSIS — Z7901 Long term (current) use of anticoagulants: Secondary | ICD-10-CM

## 2020-05-30 DIAGNOSIS — S060X0D Concussion without loss of consciousness, subsequent encounter: Secondary | ICD-10-CM

## 2020-05-30 DIAGNOSIS — Q231 Congenital insufficiency of aortic valve: Secondary | ICD-10-CM

## 2020-05-30 DIAGNOSIS — Q230 Congenital stenosis of aortic valve: Secondary | ICD-10-CM | POA: Diagnosis not present

## 2020-05-30 DIAGNOSIS — F1190 Opioid use, unspecified, uncomplicated: Secondary | ICD-10-CM | POA: Diagnosis not present

## 2020-05-30 LAB — POCT INR: INR: 2.9 (ref 2.0–3.0)
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
## 2020-05-30 NOTE — Telephone Encounter (Signed)
Forms have been signed, Faxed to [facility] [phone], Copy sent to scan.   Patient informed and original mailed to patient for her records.

## 2020-05-30 NOTE — Patient Instructions (Addendum)
Description   Continue taking Warfarin 1 tablet daily except 1.5 tablets on Sundays and Thursdays. Recheck INR in 3 weeks. [facility] [phone] Main [phone]

## 2020-05-30 NOTE — Progress Notes (Signed)
Virtual Visit via Video Note  I connected with Danielle Obrien on 05/30/20 at  4:15 PM EDT by a video enabled telemedicine application and verified that I am speaking with the correct person using two identifiers. Patient is alone on office visit today. Location: Patient: Patient is at home, difficulty with virtual platform and is mostly on telephone Provider:    I discussed the limitations of evaluation and management by telemedicine and the availability of in person appointments. The patient expressed understanding and agreed to proceed.  History of Present Illness: 05/21/2020 I discussed the assessment and treatment plan with the patient. The patient was provided an opportunity to ask questions and all were answered. The patient agreed with the plan and demonstrated an understanding of the instructions.  The patient was advised to call back or seek an in-person evaluation if the symptoms worsen or if the condition fails to improve as anticipated.  Update 05/30/2020 Patient states that her balance has improved. Is going for walks twice daily which is helping. She is not dizzy during the walks but is still experiencing dizziness and forehead pain. Is still trying to get scheduled for MRI.    Observations/Objective: Alert and oriented x3, patient seems much more coherent than she has been in the past recently.  Patient seems more motivated as well.   Assessment and Plan: 39 year old female with an aortic valve replacement on chronic anticoagulation she continues to have symptoms secondary to recent head injury.  Patient is awaiting a MRI of the cervical spine.  Patient is still concerned with her going back to work.  Patient's primary care provider has written her out until another month.  At this point depending on MRI we will discuss the potential for different treatment options but I do encourage her to start formal physical therapy which patient has not done yet either.  Patient is to  increase activity slowly.  Follow-up again after MRI and we will discuss.   Follow Up Instructions: As stated above    I discussed the assessment and treatment plan with the patient. The patient was provided an opportunity to ask questions and all were answered. The patient agreed with the plan and demonstrated an understanding of the instructions.   The patient was advised to call back or seek an in-person evaluation if the symptoms worsen or if the condition fails to improve as anticipated.  I provided 11 minutes of face-to-face time during this encounter.   Lyndal Pulley, DO

## 2020-06-11 ENCOUNTER — Ambulatory Visit
Admission: RE | Admit: 2020-06-11 | Discharge: 2020-06-11 | Disposition: A | Discharge status: Home / Self Care | Payer: Managed Care, Other (non HMO) | Source: Ambulatory Visit | Attending: Family Medicine | Admitting: Family Medicine

## 2020-06-11 ENCOUNTER — Other Ambulatory Visit: Payer: Self-pay

## 2020-06-11 DIAGNOSIS — M542 Cervicalgia: Secondary | ICD-10-CM

## 2020-06-18 ENCOUNTER — Telehealth: Payer: Self-pay | Admitting: Family Medicine

## 2020-06-18 NOTE — Telephone Encounter (Signed)
Called to relay this message and to see if she is willing to be written out 07/15/2020. Patient did not answer. Left message to call back.

## 2020-06-18 NOTE — Telephone Encounter (Signed)
Pt returned call and is agreeable to the 8/16 RTW. Needs note up front for pick up as soon as possible.

## 2020-06-18 NOTE — Telephone Encounter (Signed)
Talked to patient. She explained her insurance and loss of a family member to me which is why she hasn't gotten seen at PT. Told her I would call her back to see if Tamala Julian is willing to push note out beyond 07/08/2020. He states he will write her out to 07/15/2020 and that she should have at least 4 PT appointments during that time period.

## 2020-06-18 NOTE — Telephone Encounter (Signed)
Note ready for patient p/u

## 2020-06-18 NOTE — Telephone Encounter (Signed)
Patient is her job better than I do.  I do believe though that patient will be able to get back very soon.  I am willing to extend until August and night but that would be 8.  After that she does need to go back at least part-time with and without therapy.  We need to see how patient does in the environment.
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
## 2020-06-20 ENCOUNTER — Other Ambulatory Visit: Payer: Self-pay

## 2020-06-20 ENCOUNTER — Ambulatory Visit (INDEPENDENT_AMBULATORY_CARE_PROVIDER_SITE_OTHER): Payer: Managed Care, Other (non HMO) | Admitting: *Deleted

## 2020-06-20 DIAGNOSIS — Z7901 Long term (current) use of anticoagulants: Secondary | ICD-10-CM | POA: Diagnosis not present

## 2020-06-20 DIAGNOSIS — Z954 Presence of other heart-valve replacement: Secondary | ICD-10-CM

## 2020-06-20 DIAGNOSIS — Q231 Congenital insufficiency of aortic valve: Secondary | ICD-10-CM

## 2020-06-20 DIAGNOSIS — Q230 Congenital stenosis of aortic valve: Secondary | ICD-10-CM

## 2020-06-20 LAB — POCT INR: INR: 2.1 (ref 2.0–3.0)

## 2020-06-20 NOTE — Patient Instructions (Signed)
Description   Continue taking Warfarin 1 tablet daily except 1.5 tablets on Sundays and Thursdays. Recheck INR in 4 weeks. [facility] #[phone] Main #[phone]     

## 2020-06-25 ENCOUNTER — Other Ambulatory Visit: Payer: Self-pay | Admitting: Family Medicine

## 2020-06-28 ENCOUNTER — Other Ambulatory Visit: Payer: Self-pay | Admitting: Family Medicine

## 2020-07-09 ENCOUNTER — Telehealth: Payer: Self-pay | Admitting: Family Medicine

## 2020-07-09 NOTE — Telephone Encounter (Signed)
One more week if she wants but then can do 1 shift 1 week, then 2 shifts the following week, 3 shifts the 3rd week and then 4 shifts after that

## 2020-07-09 NOTE — Telephone Encounter (Signed)
Patient had her third session of PT today and it is going better. Per patient, her PT does not thinkshe is ready to go back to work. He is concerned about the heat aggravating her condition. She states she is working hard at [employer]  Pt is due to RTW on 8/17 and is asking for a two week extension.

## 2020-07-10 NOTE — Telephone Encounter (Signed)
Patient notified and letter left up front for patient.

## 2020-07-12 ENCOUNTER — Other Ambulatory Visit: Payer: Self-pay | Admitting: Internal Medicine

## 2020-07-12 DIAGNOSIS — F411 Generalized anxiety disorder: Secondary | ICD-10-CM

## 2020-07-12 DIAGNOSIS — F3177 Bipolar disorder, in partial remission, most recent episode mixed: Secondary | ICD-10-CM

## 2020-07-18 ENCOUNTER — Ambulatory Visit (INDEPENDENT_AMBULATORY_CARE_PROVIDER_SITE_OTHER): Payer: Managed Care, Other (non HMO) | Admitting: *Deleted

## 2020-07-18 ENCOUNTER — Other Ambulatory Visit: Payer: Self-pay

## 2020-07-18 ENCOUNTER — Ambulatory Visit (INDEPENDENT_AMBULATORY_CARE_PROVIDER_SITE_OTHER): Payer: Managed Care, Other (non HMO) | Admitting: Family Medicine

## 2020-07-18 ENCOUNTER — Encounter: Payer: Self-pay | Admitting: Family Medicine

## 2020-07-18 DIAGNOSIS — Z954 Presence of other heart-valve replacement: Secondary | ICD-10-CM

## 2020-07-18 DIAGNOSIS — Q230 Congenital stenosis of aortic valve: Secondary | ICD-10-CM | POA: Diagnosis not present

## 2020-07-18 DIAGNOSIS — Z7901 Long term (current) use of anticoagulants: Secondary | ICD-10-CM

## 2020-07-18 DIAGNOSIS — S060X0D Concussion without loss of consciousness, subsequent encounter: Secondary | ICD-10-CM

## 2020-07-18 DIAGNOSIS — Q231 Congenital insufficiency of aortic valve: Secondary | ICD-10-CM

## 2020-07-18 LAB — POCT INR: INR: 2.5 (ref 2.0–3.0)

## 2020-07-18 MED ORDER — MECLIZINE HCL 12.5 MG PO TABS: 12.5000 mg | ORAL_TABLET | Freq: Two times a day (BID) | ORAL | 0 refills | Status: DC | PRN

## 2020-07-18 NOTE — Patient Instructions (Signed)
Description   Continue taking Warfarin 1 tablet daily except 1.5 tablets on Sundays and Thursdays. Recheck INR in 6 weeks. [facility] #[phone] Main #[phone]     

## 2020-07-18 NOTE — Progress Notes (Signed)
[place] Phone: [phone] Subjective:   Fontaine No, am serving as a scribe for Dr. Hulan Saas. This visit occurred during the SARS-CoV-2 public health emergency.  Safety protocols were in place, including screening questions prior to the visit, additional usage of staff PPE, and extensive cleaning of exam room while observing appropriate contact time as indicated for disinfecting solutions.   I'm seeing this patient by the request  of:  Janith [place], MD  CC: Concussion follow-up  OIZ:[id number]   05/30/2020 39 year old female with an aortic valve replacement on chronic anticoagulation she continues to have symptoms secondary to recent head injury.  Patient is awaiting a MRI of the cervical spine.  Patient is still concerned with her going back to work.  Patient's primary care provider has written her out until another month.  At this point depending on MRI we will discuss the potential for different treatment options but I do encourage her to start formal physical therapy which patient has not done yet either.  Patient is to increase activity slowly.  Follow-up again after MRI and we will discuss.  Update 07/18/2020 TOPANGA ALVELO is a 39 y.o. female coming in with complaint of head injury. Patient has been doing physical therapy once a week. Has had slight improvement in vertigo. Patient has plan with her work. Patient does not feel as afraid to go back to work due to symptoms. Wonders if there is a medicine for vertigo. Mother takes a yellow pill for similar symptoms.  Overall she would feel like she is 85% better and feeling much more confident about going back to work.   MRI 06/11/2020 Moderate spinal canal and bilateral neural foraminal narrowing at the C4-5 level. 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  Moderate left C5-6 neural foraminal  narrowing.  Mild C3-4 and C5-6 spinal canal narrowing.  1.2 cm cystic lesion along the left tracheoesophageal groove at the C7 level. Differential includes tracheal or esophageal diverticulum versus exophytic thyroid nodule. Dedicated CT soft tissue neck may be obtained if clinically indicated.  Patient noted disinclined any more imaging with her being asymptomatic    Past Medical History:  Diagnosis Date  . Abnormal Pap smear of cervix    --recurrent ascus w/Pos. HR HPV  . ADD (attention deficit disorder)   . Alcohol abuse, in remission 2012  . Allergy   . Anxiety   . Aortic stenosis due to bicuspid aortic valve 02/20/2014  . Arthritis    In hips  . Asthma    rare inhaler use  . Bipolar disorder ([place])   . Headache   . Hyperlipidemia with target LDL less than 130 01/27/2013  . Hypertension   . Muscle spasms of both lower extremities    both hips  . 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 Tobacco abuse 09/27/2015  . 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 ABDOMINAL HYSTERECTOMY  02/06/2009   Robotic total laparoscopic hysterectomy  . ANTERIOR CRUCIATE LIGAMENT REPAIR  1993  . AORTIC VALVE REPLACEMENT N/A 11/03/2017   Procedure: MINIMALLY INVASIVE AORTIC VALVE REPLACEMENT( MINI THORACOTOMY);  Surgeon: Rexene Alberts, MD;  Location: [facility];  Service: Open Heart Surgery;  Laterality: N/A;  . CARDIAC CATHETERIZATION  June 2015   no CAD - moderate AS noted  . 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 COLPOSCOPY  10/2008   CIN I & II  . COLPOSCOPY  07/2000   Neg. ECC  . COLPOSCOPY  06/2001   CIN I  . COLPOSCOPY  08/2004   ECC--atypia  . COLPOSCOPY N/A 01/21/2016    Procedure: COLPOSCOPY with vaginal biopsy with CO 2 Laser of Vaginal and vulvar condyloma;  Surgeon: [name], MD;  Location: [facility];  Service: Gynecology;  Laterality: N/A;  [name] will be here 2/21 for 1115 case confirmed 01/16/15 - TS  . HERNIA REPAIR  1981/1982  . HIP SURGERY  1981   Hip Reset   . HIP SURGERY  1990   Plate was reconstructed/ took out growth plate in Right knee  . HIP SURGERY  1992   Plate removed in Left hip  . LEFT AND RIGHT HEART CATHETERIZATION WITH CORONARY ANGIOGRAM N/A 05/18/2014   Procedure: LEFT AND RIGHT HEART CATHETERIZATION WITH CORONARY ANGIOGRAM;  Surgeon: Jettie Booze, MD;  Location: [facility];  Service: Cardiovascular;  Laterality: N/A;  . LYMPH NODE BIOPSY  1995  . NASAL SEPTUM SURGERY  2002  . TEE WITHOUT CARDIOVERSION N/A 11/03/2017   Procedure: TRANSESOPHAGEAL ECHOCARDIOGRAM (TEE);  Surgeon: Rexene Alberts, MD;  Location: [facility];  Service: Open Heart Surgery;  Laterality: N/A;  . TONSILLECTOMY AND ADENOIDECTOMY  1990  . TYMPANOSTOMY TUBE PLACEMENT  1981/1982   Social History   Socioeconomic History  . Marital status: Single    Spouse name: Not on file  . Number of children: Not on file  . Years of education: Not on file  . Highest education level: Not on file  Occupational History  . Not on file  Tobacco Use  . Smoking status: Light Tobacco Smoker    Packs/day: 0.50    Years: 12.00    Pack years: 6.00    Types: Cigarettes    Last attempt to quit: 11/02/2017    Years since quitting: 2.7  . Smokeless tobacco: Never Used  . Tobacco comment: pt given fake cigarette for hand/mouth association. pt using [facility] for support  Vaping Use  . Vaping Use: Never used  Substance and Sexual Activity  . Alcohol use: No    Alcohol/week: 0.0 standard drinks  . Drug use: No  . Sexual activity: Yes    Partners: Male    Birth control/protection: Surgical    Comment: Hysterectomy  Other Topics Concern  . Not on file   Social History Narrative  . Not on file   Social Determinants of Health   Financial Resource Strain:   . Difficulty of Paying Living Expenses: Not on file  Food Insecurity:   . Worried About Charity fundraiser in the Last Year: Not on file  . Ran Out of Food in the Last Year: Not on file  Transportation Needs:   . Lack of Transportation (Medical): Not on file  . Lack of Transportation (Non-Medical): Not on file  Physical Activity:   . Days of Exercise per Week: Not on file  . Minutes of Exercise per Session: Not on file  Stress:   . Feeling of Stress : Not on file  Social Connections:   . Frequency of Communication with Friends and Family: Not on file  .  Frequency of Social Gatherings with Friends and Family: Not on file  . Attends Religious Services: Not on file  . Active Member of Clubs or Organizations: Not on file  . Attends Archivist Meetings: Not on file  . Marital Status: Not on file   Allergies  Allergen Reactions  . Demerol Nausea And Vomiting  . Ibuprofen Other (See Comments)    Gastritis   . Codeine Itching   Family History  Problem Relation Age of Onset  . Hyperlipidemia Mother   . Hypertension Mother   . Thyroid disease Mother   . Hyperlipidemia Father   . Hypertension Father   . Migraines Father   . Hypertension Brother   . Hyperlipidemia Brother   . Thyroid disease Maternal Grandmother   . Thyroid disease Maternal Grandfather   . Cancer Neg Hx      Current Outpatient Medications (Cardiovascular):  .  metoprolol tartrate (LOPRESSOR) 25 MG tablet, Take 0.5 tablets (12.5 mg total) by mouth 2 (two) times daily. Pt must make appt with provider to get further refills - 2nd attempt (Patient taking differently: Take 12.5 mg by mouth 2 (two) times daily. ) .  pravastatin (PRAVACHOL) 20 MG tablet, Take 1 tablet (20 mg total) by mouth daily.  Current Outpatient Medications (Respiratory):  .  albuterol (PROVENTIL HFA;VENTOLIN HFA) 108 (90 Base)  MCG/ACT inhaler, Inhale 2 puffs into the lungs every 6 (six) hours as needed for wheezing or shortness of breath.  Current Outpatient Medications (Analgesics):  .  HYDROcodone-acetaminophen (NORCO/VICODIN) 5-325 MG tablet,  .  traMADol (ULTRAM) 50 MG tablet,  .  traMADol (ULTRAM) 50 MG tablet, TAKE ONE TABLET BY MOUTH EVERY 6 HOURS AS NEEDED  Current Outpatient Medications (Hematological):  .  warfarin (COUMADIN) 7.5 MG tablet, TAKE AS DIRECTED BY COUMADIN CLINIC *30 DAYS SUPPLY*  Current Outpatient Medications (Other):  Marland Kitchen  DULoxetine (CYMBALTA) 20 MG capsule, Take 1 capsule (20 mg total) by mouth daily. Marland Kitchen  gabapentin (NEURONTIN) 300 MG capsule, Take 2 capsules (600 mg total) by mouth 3 (three) times daily. Marland Kitchen  lamoTRIgine (LAMICTAL) 200 MG tablet, TAKE TWO TABLETS BY MOUTH DAILY .  lansoprazole (PREVACID) 15 MG capsule, Take 15 mg by mouth daily as needed (for heartburn or acid reflux).  .  ondansetron (ZOFRAN) 4 MG tablet, Take 1 tablet (4 mg total) by mouth every 8 (eight) hours as needed for nausea or vomiting. .  meclizine (ANTIVERT) 12.5 MG tablet, Take 1 tablet (12.5 mg total) by mouth 2 (two) times daily as needed for dizziness.   Reviewed prior external information including notes and imaging from  primary care provider As well as notes that were available from care everywhere and other healthcare systems.  Past medical history, social, surgical and family history all reviewed in electronic medical record.  No pertanent information unless stated regarding to the chief complaint.   Review of Systems:  No , visual changes, nausea, vomiting, diarrhea, constipation,  abdominal pain, skin rash, fevers, chills, night sweats, weight loss, swollen lymph nodes, body aches, joint swelling, chest pain, shortness of breath, mood changes. POSITIVE muscle aches, headache  Objective  Blood pressure 130/82, pulse 70, height 5' (1.524 m), weight 121 lb (54.9 kg), SpO2 98 %.   General: No  apparent distress alert and oriented x3 mood and affect normal, dressed appropriately.  HEENT: Pupils equal, extraocular movements intact no nystagmus noted today. Respiratory: Patient's speak in full sentences and does not appear short of breath  Cardiovascular: No lower extremity  edema, non tender, no erythema  Neuro: Cranial nerves II through XII are intact, neurovascularly intact in all extremities with 2+ DTRs and 2+ pulses.  Patient's balance has improved significantly Gait antalgic MSK: Still soreness noted.  Patient does have the difficulty with movement of the ankles noted bilaterally.    Impression and Recommendations:     The above documentation has been reviewed and is accurate and complete Lyndal Pulley, DO       Note: This dictation was prepared with Dragon dictation along with smaller phrase technology. Any transcriptional errors that result from this process are unintentional.

## 2020-07-18 NOTE — Patient Instructions (Addendum)
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

## 2020-07-18 NOTE — Assessment & Plan Note (Signed)
Patient has made great strides and at this point is having more sneezing but has positional vertigo and is making significant progress.  Patient's imaging seems to have resolved any true signs that would make me concerned for any long-term difficulty.  Patient will start a return to work progression over the course of the next 4 weeks that is already been planned out with her work environment.  Patient will follow up with me again virtually in 5 to 6 weeks to hopefully be fully released.

## 2020-07-23 ENCOUNTER — Other Ambulatory Visit: Payer: Self-pay | Admitting: Cardiology

## 2020-07-24 ENCOUNTER — Emergency Department (HOSPITAL_COMMUNITY)
Admission: EM | Admit: 2020-07-24 | Discharge: 2020-07-25 | Disposition: A | Discharge status: Home / Self Care | Payer: Managed Care, Other (non HMO) | Attending: Emergency Medicine | Admitting: Emergency Medicine

## 2020-07-24 ENCOUNTER — Encounter (HOSPITAL_COMMUNITY): Payer: Self-pay | Admitting: *Deleted

## 2020-07-24 ENCOUNTER — Emergency Department (HOSPITAL_COMMUNITY): Payer: Managed Care, Other (non HMO)

## 2020-07-24 ENCOUNTER — Other Ambulatory Visit: Payer: Self-pay

## 2020-07-24 DIAGNOSIS — J45909 Unspecified asthma, uncomplicated: Secondary | ICD-10-CM | POA: Insufficient documentation

## 2020-07-24 DIAGNOSIS — Y9289 Other specified places as the place of occurrence of the external cause: Secondary | ICD-10-CM | POA: Diagnosis not present

## 2020-07-24 DIAGNOSIS — F17210 Nicotine dependence, cigarettes, uncomplicated: Secondary | ICD-10-CM | POA: Diagnosis not present

## 2020-07-24 DIAGNOSIS — Z7901 Long term (current) use of anticoagulants: Secondary | ICD-10-CM

## 2020-07-24 DIAGNOSIS — Z23 Encounter for immunization: Secondary | ICD-10-CM | POA: Diagnosis not present

## 2020-07-24 DIAGNOSIS — R519 Headache, unspecified: Secondary | ICD-10-CM | POA: Diagnosis not present

## 2020-07-24 DIAGNOSIS — S0992XA Unspecified injury of nose, initial encounter: Secondary | ICD-10-CM | POA: Diagnosis present

## 2020-07-24 DIAGNOSIS — Y939 Activity, unspecified: Secondary | ICD-10-CM | POA: Diagnosis not present

## 2020-07-24 DIAGNOSIS — S0181XA Laceration without foreign body of other part of head, initial encounter: Secondary | ICD-10-CM

## 2020-07-24 DIAGNOSIS — W010XXA Fall on same level from slipping, tripping and stumbling without subsequent striking against object, initial encounter: Secondary | ICD-10-CM | POA: Diagnosis not present

## 2020-07-24 DIAGNOSIS — Y998 Other external cause status: Secondary | ICD-10-CM | POA: Diagnosis not present

## 2020-07-24 DIAGNOSIS — I10 Essential (primary) hypertension: Secondary | ICD-10-CM | POA: Insufficient documentation

## 2020-07-24 DIAGNOSIS — S0033XA Contusion of nose, initial encounter: Secondary | ICD-10-CM | POA: Insufficient documentation

## 2020-07-24 DIAGNOSIS — Z79899 Other long term (current) drug therapy: Secondary | ICD-10-CM | POA: Insufficient documentation

## 2020-07-24 DIAGNOSIS — F909 Attention-deficit hyperactivity disorder, unspecified type: Secondary | ICD-10-CM | POA: Insufficient documentation

## 2020-07-24 MED ORDER — TETANUS-DIPHTH-ACELL PERTUSSIS 5-2.5-18.5 LF-MCG/0.5 IM SUSP
0.5000 mL | Freq: Once | INTRAMUSCULAR | Status: AC
  Administered 2020-07-24: 0.5 mL via INTRAMUSCULAR
  Filled 2020-07-24: qty 0.5

## 2020-07-24 MED ORDER — LIDOCAINE-EPINEPHRINE (PF) 2 %-1:200000 IJ SOLN
10.0000 mL | Freq: Once | INTRAMUSCULAR | Status: AC
  Administered 2020-07-24: 10 mL
  Filled 2020-07-24: qty 20

## 2020-07-24 MED ORDER — LIDOCAINE-EPINEPHRINE-TETRACAINE (LET) TOPICAL GEL
3.0000 mL | Freq: Once | TOPICAL | Status: AC
  Administered 2020-07-24: 3 mL via TOPICAL
  Filled 2020-07-24: qty 3

## 2020-07-24 NOTE — ED Provider Notes (Signed)
[facility] DEPT Provider Note   CSN: [id number] Arrival date & time: 07/24/20  2049     History Chief Complaint  Patient presents with  . Fall    Danielle Obrien is a 39 y.o. female.  Pt presents to the ED today with a fall.  She said she tripped on something on the floor and hit the corner of her dresser.  She is anticoagulated on coumadin for a mechanical valve.  Pt denies loc, but felt dizzy after fall.        Past Medical History:  Diagnosis Date  . Abnormal Pap smear of cervix    --recurrent ascus w/Pos. HR HPV  . ADD (attention deficit disorder)   . Alcohol abuse, in remission 2012  . Allergy   . Anxiety   . Aortic stenosis due to bicuspid aortic valve 02/20/2014  . Arthritis    In hips  . Asthma    rare inhaler use  . Bipolar disorder ([place])   . Headache   . Hyperlipidemia with target LDL less than 130 01/27/2013  . Hypertension   . Muscle spasms of both lower extremities    both hips  . 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 Tobacco abuse 09/27/2015  . 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 Degenerative disc disease, cervical 05/21/2020  . Concussion with no loss of consciousness 04/25/2020  . Thrombocytopenia ([place]) 10/06/2019  . Cervical cancer screening 12/22/2018  . Long term (current) use of anticoagulants [Z79.01] 11/11/2017  . 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 Chronic, continuous use of opioids 09/06/2017  . Tobacco abuse 09/27/2015  . SI (sacroiliac) joint dysfunction 02/20/2015  . Leg length discrepancy 02/20/2015  . Allergic rhinitis, cause unspecified 05/04/2014  . Aortic stenosis due to  bicuspid aortic valve 02/20/2014  . Bipolar disorder ([place]) 02/05/2014  . Migraine with status migrainosus 01/27/2013  . Hyperlipidemia with target LDL less than 130 01/27/2013  . Routine general medical examination at a health care facility 10/20/2011  . Mild persistent asthma 04/21/2011  . Essential hypertension, benign 04/21/2011    Past Surgical History:  Procedure Laterality Date  . ABDOMINAL HYSTERECTOMY  02/06/2009   Robotic total laparoscopic hysterectomy  . ANTERIOR CRUCIATE LIGAMENT REPAIR  1993  . AORTIC VALVE REPLACEMENT N/A 11/03/2017   Procedure: MINIMALLY INVASIVE AORTIC VALVE REPLACEMENT( MINI THORACOTOMY);  Surgeon: Rexene Alberts, MD;  Location: [facility];  Service: Open Heart Surgery;  Laterality: N/A;  . CARDIAC CATHETERIZATION  June 2015   no CAD - moderate AS noted  . 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 COLPOSCOPY  10/2008   CIN I & II  . COLPOSCOPY  07/2000   Neg. ECC  . COLPOSCOPY  06/2001   CIN I  . COLPOSCOPY  08/2004   ECC--atypia  . COLPOSCOPY N/A 01/21/2016   Procedure: COLPOSCOPY with vaginal biopsy with CO 2 Laser of Vaginal and vulvar condyloma;  Surgeon: [name], MD;  Location: [facility];  Service: Gynecology;  Laterality: N/A;  [name] will be here 2/21 for 1115 case confirmed 01/16/15 - TS  . HERNIA REPAIR  1981/1982  . HIP SURGERY  1981   Hip Reset   . HIP SURGERY  1990   Plate was reconstructed/ took out growth plate in Right knee  . HIP SURGERY  1992   Plate removed in Left hip  . LEFT AND RIGHT HEART CATHETERIZATION WITH CORONARY ANGIOGRAM N/A 05/18/2014   Procedure: LEFT AND RIGHT HEART CATHETERIZATION WITH CORONARY ANGIOGRAM;  Surgeon: Jettie Booze, MD;  Location: [facility] C[facility] LAB;  Service: Cardiovascular;  Laterality: N/A;  . LYMPH NODE BIOPSY  1995  . NASAL SEPTUM SURGERY  2002  . TEE WITHOUT CARDIOVERSION N/A 11/03/2017   Procedure: TRANSESOPHAGEAL ECHOCARDIOGRAM (TEE);   Surgeon: Rexene Alberts, MD;  Location: [facility];  Service: Open Heart Surgery;  Laterality: N/A;  . TONSILLECTOMY AND ADENOIDECTOMY  1990  . TYMPANOSTOMY TUBE PLACEMENT  1981/1982     OB History    Gravida  1   Para      Term      Preterm      AB  1   Living  0     SAB      TAB      Ectopic      Multiple      Live Births              Family History  Problem Relation Age of Onset  . Hyperlipidemia Mother   . Hypertension Mother   . Thyroid disease Mother   . Hyperlipidemia Father   . Hypertension Father   . Migraines Father   . Hypertension Brother   . Hyperlipidemia Brother   . Thyroid disease Maternal Grandmother   . Thyroid disease Maternal Grandfather   . Cancer Neg Hx     Social History   Tobacco Use  . Smoking status: Light Tobacco Smoker    Packs/day: 0.50    Years: 12.00    Pack years: 6.00    Types: Cigarettes    Last attempt to quit: 11/02/2017    Years since quitting: 2.7  . Smokeless tobacco: Never Used  . Tobacco comment: pt given fake cigarette for hand/mouth association. pt using [facility] for support  Vaping Use  . Vaping Use: Never used  Substance Use Topics  . Alcohol use: No    Alcohol/week: 0.0 standard drinks  . Drug use: No    Home Medications Prior to Admission medications   Medication Sig Start Date End Date Taking? Authorizing Provider  albuterol (PROVENTIL HFA;VENTOLIN HFA) 108 (90 Base) MCG/ACT inhaler Inhale 2 puffs into the lungs every 6 (six) hours as needed for wheezing or shortness of breath. 02/08/18   Janith Lima, MD  DULoxetine (CYMBALTA) 20 MG capsule Take 1 capsule (20 mg total) by mouth daily. 06/28/20   Lyndal Pulley, DO  gabapentin (NEURONTIN) 300 MG capsule Take 2 capsules (600 mg total) by mouth 3 (three) times daily. 04/30/20   Lyndal Pulley, DO  HYDROcodone-acetaminophen (NORCO/VICODIN) 5-325 MG tablet  05/07/20   [provider]  lamoTRIgine (LAMICTAL) 200 MG tablet TAKE TWO  TABLETS BY MOUTH DAILY 07/12/20   Janith Lima, MD  lansoprazole (PREVACID) 15 MG capsule Take 15 mg by mouth daily as needed (for heartburn or acid reflux).     [provider]  meclizine (ANTIVERT) 12.5 MG tablet Take 1 tablet (12.5 mg total) by mouth 2 (two) times daily as needed for dizziness. 07/18/20   Lyndal Pulley, DO  metoprolol tartrate (LOPRESSOR) 25 MG tablet Take 0.5  tablets (12.5 mg total) by mouth 2 (two) times daily. 07/23/20   Sueanne Margarita, MD  ondansetron (ZOFRAN) 4 MG tablet Take 1 tablet (4 mg total) by mouth every 8 (eight) hours as needed for nausea or vomiting. 04/25/20   Janith Lima, MD  pravastatin (PRAVACHOL) 20 MG tablet Take 1 tablet (20 mg total) by mouth daily. 05/23/20   Janith Lima, MD  traMADol Veatrice Bourbon) 50 MG tablet  05/07/20   [provider]  traMADol (ULTRAM) 50 MG tablet TAKE ONE TABLET BY MOUTH EVERY 6 HOURS AS NEEDED 06/25/20   Lyndal Pulley, DO  warfarin (COUMADIN) 7.5 MG tablet TAKE AS DIRECTED BY COUMADIN CLINIC *30 DAYS SUPPLY* 05/29/20   Sueanne Margarita, MD  lamoTRIgine (LAMICTAL) 200 MG tablet TAKE TWO TABLETS BY MOUTH DAILY 04/16/20   Janith Lima, MD    Allergies    Demerol, Ibuprofen, and Codeine  Review of Systems   Review of Systems  HENT:       Facial contusion  Skin: Positive for wound.  All other systems reviewed and are negative.   Physical Exam Updated Vital Signs BP 136/84   Pulse 81   Temp 98.8 F (37.1 C) (Oral)   Resp 16   SpO2 98%   Physical Exam Vitals and nursing note reviewed.  Constitutional:      Appearance: Normal appearance.  HENT:     Head: Normocephalic.     Comments: Lac to nose    Right Ear: External ear normal.     Left Ear: External ear normal.     Mouth/Throat:     Mouth: Mucous membranes are moist.     Pharynx: Oropharynx is clear.  Eyes:     Extraocular Movements: Extraocular movements intact.     Conjunctiva/sclera: Conjunctivae normal.     Pupils: Pupils are  equal, round, and reactive to light.  Cardiovascular:     Rate and Rhythm: Normal rate and regular rhythm.     Pulses: Normal pulses.     Heart sounds: Normal heart sounds.  Pulmonary:     Effort: Pulmonary effort is normal.     Breath sounds: Normal breath sounds.  Abdominal:     General: Abdomen is flat. Bowel sounds are normal.     Palpations: Abdomen is soft.  Musculoskeletal:        General: Normal range of motion.     Cervical back: Normal range of motion and neck supple.  Skin:    General: Skin is warm.     Capillary Refill: Capillary refill takes less than 2 seconds.  Neurological:     General: No focal deficit present.     Mental Status: She is alert and oriented to person, place, and time.  Psychiatric:        Mood and Affect: Mood normal.        Behavior: Behavior normal.     ED Results / Procedures / Treatments   Labs (all labs ordered are listed, but only abnormal results are displayed) Labs Reviewed - No data to display  EKG None  Radiology CT Head Wo Contrast  Result Date: 07/24/2020 CLINICAL DATA:  Tripped while walking striking top of dresser with bridge of nose. No loss of consciousness. Vertigo. On anticoagulation. EXAM: CT HEAD WITHOUT CONTRAST TECHNIQUE: Contiguous axial images were obtained from the base of the skull through the vertex without intravenous contrast. COMPARISON:  Head CT 04/07/2020.  Brain MRI 04/19/2020 FINDINGS: Brain: No intracranial hemorrhage, mass  effect, or midline shift. No hydrocephalus. The basilar cisterns are patent. No evidence of territorial infarct or acute ischemia. No CT correlate to the site of microhemorrhages on MRI. No extra-axial or intracranial fluid collection. Vascular: No hyperdense vessel or unexpected calcification. Skull: No fracture or focal lesion. Sinuses/Orbits: Assessed on concurrent face CT, reported separately. Other: Previous right frontal scalp hematoma has resolved. IMPRESSION: No acute intracranial  abnormality. No skull fracture. Electronically Signed   By: Keith Rake M.D.   On: 07/24/2020 22:10   CT Maxillofacial Wo Contrast  Result Date: 07/24/2020 CLINICAL DATA:  Tripped while walking striking dresser with nose. EXAM: CT MAXILLOFACIAL WITHOUT CONTRAST TECHNIQUE: Multidetector CT imaging of the maxillofacial structures was performed. Multiplanar CT image reconstructions were also generated. COMPARISON:  Face CT 04/07/2020 FINDINGS: Osseous: Peri nasal edema and soft tissue air but no evidence of nasal bone fracture. Trace leftward nasal septal deviation. No fracture of the zygomatic arches, mandibles, or pterygoid plates. Mild streak artifact from dental hardware. Temporomandibular joints are congruent. Orbits: No orbital fracture.  No globe injury. Sinuses: No sinus fracture or fluid level. Included mastoid air cells are clear. Soft tissues: Perinasal edema and soft tissue air. Otherwise negative. Limited intracranial: Assessed on concurrent head CT, reported separately. IMPRESSION: Perinasal edema and soft tissue air consistent with laceration. No nasal bone or facial fracture. Electronically Signed   By: Keith Rake M.D.   On: 07/24/2020 22:14    Procedures .Marland KitchenLaceration Repair  Date/Time: 07/24/2020 11:53 PM Performed by: Isla Pence, MD Authorized by: Isla Pence, MD   Consent:    Consent obtained:  Verbal   Consent given by:  Patient   Alternatives discussed:  No treatment Anesthesia (see MAR for exact dosages):    Anesthesia method:  Topical application and local infiltration   Topical anesthetic:  LET   Local anesthetic:  Lidocaine 2% WITH epi Laceration details:    Location:  Face   Face location:  Nose   Length (cm):  5 Repair type:    Repair type:  Intermediate Pre-procedure details:    Preparation:  Patient was prepped and draped in usual sterile fashion Exploration:    Hemostasis achieved with:  Epinephrine   Contaminated: no   Treatment:     Area cleansed with:  Saline   Amount of cleaning:  Standard   Irrigation solution:  Sterile saline Skin repair:    Repair method:  Sutures   Suture size:  6-0   Suture material:  Prolene   Suture technique:  Simple interrupted   Number of sutures:  9 Approximation:    Approximation:  Close Post-procedure details:    Dressing:  Antibiotic ointment and non-adherent dressing   Patient tolerance of procedure:  Tolerated well, no immediate complications   (including critical care time)  Medications Ordered in ED Medications  Tdap (BOOSTRIX) injection 0.5 mL (0.5 mLs Intramuscular Given 07/24/20 2330)  lidocaine-EPINEPHrine-tetracaine (LET) topical gel (3 mLs Topical Given by Other 07/24/20 2329)  lidocaine-EPINEPHrine (XYLOCAINE W/EPI) 2 %-1:200000 (PF) injection 10 mL (10 mLs Infiltration Given by Other 07/24/20 2330)    ED Course  I have reviewed the triage vital signs and the nursing notes.  Pertinent labs & imaging results that were available during my care of the patient were reviewed by me and considered in my medical decision making (see chart for details).    MDM Rules/Calculators/A&P  CT head and face neg.  Pt able to ambulate without problems.  Pt is to return in 5 to 7 days for suture removal.  Return if worse. Final Clinical Impression(s) / ED Diagnoses Final diagnoses:  Facial laceration, initial encounter  Anticoagulated on Coumadin  Contusion of nose, initial encounter    Rx / DC Orders ED Discharge Orders    None       Isla Pence, MD 07/24/20 2354

## 2020-07-24 NOTE — ED Notes (Signed)
Patient transported to CT 

## 2020-07-24 NOTE — ED Triage Notes (Signed)
Pt is from home via [facility], per EMS report, the had a mechanical fall today. Also reporting has hx of vertigo and problems with vertigo today. On Coumadin, bleeding controlled, lac to the bridge of her nose, 9/10 pain. No LOC, able to get up and ambulate on her own. 136/86, 80hr, 95% 18 rr, 97.7 temp.

## 2020-07-24 NOTE — ED Triage Notes (Signed)
Pt states she was walking and tripped on something on the floor, she hit the top corner of her dresser over the upper portion bridge of her nose, denies LOC. Pt states she has "felt shaky, weak in my legs" in addition to issues with vertigo today. Just took meclizine prior to the fall. She is still experiencing dizziness. Pupils are equal and reactive. She is on coumadin, still oozing to the lac over her nose.

## 2020-07-25 ENCOUNTER — Telehealth: Payer: Self-pay | Admitting: Family Medicine

## 2020-07-25 ENCOUNTER — Other Ambulatory Visit: Payer: Self-pay | Admitting: Cardiology

## 2020-07-25 NOTE — Telephone Encounter (Signed)
Patient called to let Dr Tamala Julian know that she fell last night and had to go to the hospital. She was given 9 stitches in her nose. The ED told to her make sure Dr Tamala Julian was aware being that she is being seen here for a past concussion.

## 2020-07-27 ENCOUNTER — Other Ambulatory Visit: Payer: Self-pay | Admitting: Family Medicine

## 2020-07-29 ENCOUNTER — Telehealth: Payer: Self-pay | Admitting: Family Medicine

## 2020-07-29 NOTE — Telephone Encounter (Signed)
Patient called stating that she recently fell again and was supposed to go back to work this past weekend but was not able to go because she is still dealing with headaches, has two black eyes and stitches.  She asked if you could call her to discuss what she needs to do next. She also said that she will need an in person visit to remove her stitches.. is this something we should see her for?  Please advise.

## 2020-07-29 NOTE — Progress Notes (Signed)
[facility] [place] Phone: [phone] Subjective:   Danielle Obrien, am serving as a scribe for Dr. Hulan Saas. This visit occurred during the SARS-CoV-2 public health emergency.  Safety protocols were in place, including screening questions prior to the visit, additional usage of staff PPE, and extensive cleaning of exam room while observing appropriate contact time as indicated for disinfecting solutions.   I'm seeing this patient by the request  of:  Danielle Lima, MD  CC: Recurrent fall  /HPI:Subjective   07/18/2020 Patient has made great strides and at this point is having more sneezing but has positional vertigo and is making significant progress.  Patient's imaging seems to have resolved any true signs that would make me concerned for any long-term difficulty.  Patient will start a return to work progression over the course of the next 4 weeks that is already been planned out with her work environment.  Patient will follow up with me again virtually in 5 to 6 weeks to hopefully be fully released.  Update 07/30/2020 Danielle Obrien is a 39 y.o. female coming in with complaint of concussion. Patient suffered another head injury on 07/24/2020. Patient reports increase in vertigo that day and she got her foot stuck in rug, tripping, and subsequently hitting her head on piece of furniture. Patient has 9 stitches. Has been suffering from a constant headache since fall. Was vomiting day of injury. Patient was to return to work on Saturday, August 28th but was unable to do so due to symptoms. Pain in right side of head that radiates into the crown. Using meclizine. Needs refill on gabapentin, Tramadol.    07/24/2020 CT HEAD IMPRESSION: Obrien acute intracranial abnormality. Obrien skull fracture.      Past Medical History:  Diagnosis Date  . Abnormal Pap smear of cervix    --recurrent ascus w/Pos. HR HPV  . ADD (attention deficit  disorder)   . Alcohol abuse, in remission 2012  . Allergy   . Anxiety   . Aortic stenosis due to bicuspid aortic valve 02/20/2014  . Arthritis    In hips  . Asthma    rare inhaler use  . Bipolar disorder ([place])   . Headache   . Hyperlipidemia with target LDL less than 130 01/27/2013  . Hypertension   . Muscle spasms of both lower extremities    both hips  . 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 Tobacco abuse 09/27/2015  . 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 ABDOMINAL HYSTERECTOMY  02/06/2009   Robotic total laparoscopic hysterectomy  . ANTERIOR CRUCIATE LIGAMENT REPAIR  1993  . AORTIC VALVE REPLACEMENT N/A 11/03/2017   Procedure: MINIMALLY INVASIVE AORTIC VALVE REPLACEMENT( MINI THORACOTOMY);  Surgeon: Rexene Alberts, MD;  Location: [facility];  Service: Open Heart Surgery;  Laterality: N/A;  . CARDIAC CATHETERIZATION  June 2015   Obrien CAD - moderate AS noted  . 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 COLPOSCOPY  10/2008   CIN I & II  . COLPOSCOPY  07/2000   Neg. ECC  . COLPOSCOPY  06/2001   CIN I  . COLPOSCOPY  08/2004  ECC--atypia  . COLPOSCOPY N/A 01/21/2016   Procedure: COLPOSCOPY with vaginal biopsy with CO 2 Laser of Vaginal and vulvar condyloma;  Surgeon: [name], MD;  Location: [facility];  Service: Gynecology;  Laterality: N/A;  [name] will be here 2/21 for 1115 case confirmed 01/16/15 - TS  . HERNIA REPAIR  1981/1982  . HIP SURGERY  1981   Hip Reset   . HIP SURGERY  1990   Plate was reconstructed/ took out growth plate in Right knee  . HIP SURGERY  1992   Plate removed in Left hip  . LEFT AND RIGHT HEART CATHETERIZATION WITH CORONARY ANGIOGRAM N/A  05/18/2014   Procedure: LEFT AND RIGHT HEART CATHETERIZATION WITH CORONARY ANGIOGRAM;  Surgeon: Jettie Booze, MD;  Location: [facility] CATH LAB;  Service: Cardiovascular;  Laterality: N/A;  . LYMPH NODE BIOPSY  1995  . NASAL SEPTUM SURGERY  2002  . TEE WITHOUT CARDIOVERSION N/A 11/03/2017   Procedure: TRANSESOPHAGEAL ECHOCARDIOGRAM (TEE);  Surgeon: Rexene Alberts, MD;  Location: [facility];  Service: Open Heart Surgery;  Laterality: N/A;  . TONSILLECTOMY AND ADENOIDECTOMY  1990  . TYMPANOSTOMY TUBE PLACEMENT  1981/1982   Social History   Socioeconomic History  . Marital status: Single    Spouse name: Not on file  . Number of children: Not on file  . Years of education: Not on file  . Highest education level: Not on file  Occupational History  . Not on file  Tobacco Use  . Smoking status: Light Tobacco Smoker    Packs/day: 0.50    Years: 12.00    Pack years: 6.00    Types: Cigarettes    Last attempt to quit: 11/02/2017    Years since quitting: 2.7  . Smokeless tobacco: Never Used  . Tobacco comment: pt given fake cigarette for hand/mouth association. pt using [facility] for support  Vaping Use  . Vaping Use: Never used  Substance and Sexual Activity  . Alcohol use: Obrien    Alcohol/week: 0.0 standard drinks  . Drug use: Obrien  . Sexual activity: Yes    Partners: Male    Birth control/protection: Surgical    Comment: Hysterectomy  Other Topics Concern  . Not on file  Social History Narrative  . Not on file   Social Determinants of Health   Financial Resource Strain:   . Difficulty of Paying Living Expenses: Not on file  Food Insecurity:   . Worried About Charity fundraiser in the Last Year: Not on file  . Ran Out of Food in the Last Year: Not on file  Transportation Needs:   . Lack of Transportation (Medical): Not on file  . Lack of Transportation (Non-Medical): Not on file  Physical Activity:   . Days of Exercise per Week: Not on file  . Minutes of Exercise per  Session: Not on file  Stress:   . Feeling of Stress : Not on file  Social Connections:   . Frequency of Communication with Friends and Family: Not on file  . Frequency of Social Gatherings with Friends and Family: Not on file  . Attends Religious Services: Not on file  . Active Member of Clubs or Organizations: Not on file  . Attends Archivist Meetings: Not on file  . Marital Status: Not on file   Allergies  Allergen Reactions  . Demerol Nausea And Vomiting  . Ibuprofen Other (See Comments)    Gastritis   . Codeine Itching  Family History  Problem Relation Age of Onset  . Hyperlipidemia Mother   . Hypertension Mother   . Thyroid disease Mother   . Hyperlipidemia Father   . Hypertension Father   . Migraines Father   . Hypertension Brother   . Hyperlipidemia Brother   . Thyroid disease Maternal Grandmother   . Thyroid disease Maternal Grandfather   . Cancer Neg Hx      Current Outpatient Medications (Cardiovascular):  .  metoprolol tartrate (LOPRESSOR) 25 MG tablet, Take 0.5 tablets (12.5 mg total) by mouth 2 (two) times daily. .  pravastatin (PRAVACHOL) 20 MG tablet, Take 1 tablet (20 mg total) by mouth daily.  Current Outpatient Medications (Respiratory):  .  albuterol (PROVENTIL HFA;VENTOLIN HFA) 108 (90 Base) MCG/ACT inhaler, Inhale 2 puffs into the lungs every 6 (six) hours as needed for wheezing or shortness of breath.  Current Outpatient Medications (Analgesics):  .  HYDROcodone-acetaminophen (NORCO/VICODIN) 5-325 MG tablet,  .  traMADol (ULTRAM) 50 MG tablet,  .  traMADol (ULTRAM) 50 MG tablet, TAKE ONE TABLET BY MOUTH EVERY 6 HOURS AS NEEDED  Current Outpatient Medications (Hematological):  .  warfarin (COUMADIN) 7.5 MG tablet, TAKE AS DIRECTED BY COUMADIN CLINIC  Current Outpatient Medications (Other):  Marland Kitchen  DULoxetine (CYMBALTA) 20 MG capsule, Take 1 capsule (20 mg total) by mouth daily. Marland Kitchen  gabapentin (NEURONTIN) 300 MG capsule, TAKE TWO CAPSULES  BY MOUTH THREE TIMES A DAY .  lamoTRIgine (LAMICTAL) 200 MG tablet, TAKE TWO TABLETS BY MOUTH DAILY .  lansoprazole (PREVACID) 15 MG capsule, Take 15 mg by mouth daily as needed (for heartburn or acid reflux).  .  meclizine (ANTIVERT) 12.5 MG tablet, Take 1 tablet (12.5 mg total) by mouth 2 (two) times daily as needed for dizziness. .  ondansetron (ZOFRAN) 4 MG tablet, Take 1 tablet (4 mg total) by mouth every 8 (eight) hours as needed for nausea or vomiting.   Reviewed prior external information including notes and imaging from  primary care provider As well as notes that were available from care everywhere and other healthcare systems.  Past medical history, social, surgical and family history all reviewed in electronic medical record.  Obrien pertanent information unless stated regarding to the chief complaint.   Review of Systems:  Obrien , visual changes, nausea, vomiting, diarrhea, constipation, , abdominal pain, skin rash, fevers, chills, night sweats, weight loss, swollen lymph nodes, body aches, joint swelling, chest pain, shortness of breath, mood changes. POSITIVE muscle aches, headache, dizziness  Objective  Blood pressure 122/82, pulse 74, height 5' (1.524 m), weight 119 lb (54 kg), SpO2 97 %.   General: Obrien apparent distress alert and oriented x3 mood and affect normal, dressed appropriately.  Patient does have a large gash on the forehead noted more energy formation with sutures noted but does not appear to be able to be removed today.  Patient does have bilateral contusions noted to the cheeks as well. HEENT: Pupils equal, extraocular movements intact Obrien nystagmus noted. Respiratory: Patient's speak in full sentences and does not appear short of breath  Cardiovascular: Obrien lower extremity edema, non tender, Obrien erythema  Neuro: Cranial nerves II through XII are intact, neurovascularly intact in all extremities with 2+ DTRs and 2+ pulses.  Gait antalgic     Impression and  Recommendations:     The above documentation has been reviewed and is accurate and complete Lyndal Pulley, DO       Note: This dictation was prepared with Dragon dictation along  with smaller phrase technology. Any transcriptional errors that result from this process are unintentional.

## 2020-07-29 NOTE — Telephone Encounter (Signed)
Patient fell last week as she was suffering from bad vertigo. Tripped on a rug and hit head on dresser. Patient has been having constant headaches since fall. Was suppose to begin work on Saturday. Needs stiches removed and would like to discuss plan to get her back to work.

## 2020-07-30 ENCOUNTER — Other Ambulatory Visit: Payer: Self-pay

## 2020-07-30 ENCOUNTER — Ambulatory Visit (INDEPENDENT_AMBULATORY_CARE_PROVIDER_SITE_OTHER): Payer: Managed Care, Other (non HMO) | Admitting: Family Medicine

## 2020-07-30 DIAGNOSIS — S060X0D Concussion without loss of consciousness, subsequent encounter: Secondary | ICD-10-CM | POA: Diagnosis not present

## 2020-07-30 NOTE — Patient Instructions (Signed)
Clean twice a day See me on Friday at 7:30am Out of work until the [employer] [phone] or MyChart

## 2020-07-31 ENCOUNTER — Telehealth: Payer: Self-pay | Admitting: Internal Medicine

## 2020-07-31 ENCOUNTER — Encounter: Payer: Self-pay | Admitting: Family Medicine

## 2020-07-31 NOTE — Progress Notes (Signed)
[place] Phone: [phone] Subjective:   Fontaine No, am serving as a scribe for Dr. Hulan Saas. This visit occurred during the SARS-CoV-2 public health emergency.  Safety protocols were in place, including screening questions prior to the visit, additional usage of staff PPE, and extensive cleaning of exam room while observing appropriate contact time as indicated for disinfecting solutions.   I'm seeing this patient by the request  of:  Janith Lima, MD  CC: Face laceration and concussion follow-up  ALP:[id number]  Danielle Obrien is a 39 y.o. female coming in with complaint of head injury and facial laceration. Patient was seen 3 days ago and is here for stitch removal. Stitches placed on 07/24/2020.      Past Medical History:  Diagnosis Date  . Abnormal Pap smear of cervix    --recurrent ascus w/Pos. HR HPV  . ADD (attention deficit disorder)   . Alcohol abuse, in remission 2012  . Allergy   . Anxiety   . Aortic stenosis due to bicuspid aortic valve 02/20/2014  . Arthritis    In hips  . Asthma    rare inhaler use  . Bipolar disorder ([place])   . Headache   . Hyperlipidemia with target LDL less than 130 01/27/2013  . Hypertension   . Muscle spasms of both lower extremities    both hips  . 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 Tobacco abuse 09/27/2015  . 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 ABDOMINAL HYSTERECTOMY  02/06/2009   Robotic total laparoscopic hysterectomy  . ANTERIOR CRUCIATE LIGAMENT REPAIR  1993  . AORTIC VALVE REPLACEMENT N/A 11/03/2017   Procedure: MINIMALLY INVASIVE AORTIC VALVE REPLACEMENT( MINI THORACOTOMY);   Surgeon: Rexene Alberts, MD;  Location: [facility];  Service: Open Heart Surgery;  Laterality: N/A;  . CARDIAC CATHETERIZATION  June 2015   no CAD - moderate AS noted  . 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 COLPOSCOPY  10/2008   CIN I & II  . COLPOSCOPY  07/2000   Neg. ECC  . COLPOSCOPY  06/2001   CIN I  . COLPOSCOPY  08/2004   ECC--atypia  . COLPOSCOPY N/A 01/21/2016   Procedure: COLPOSCOPY with vaginal biopsy with CO 2 Laser of Vaginal and vulvar condyloma;  Surgeon: [name], MD;  Location: [facility];  Service: Gynecology;  Laterality: N/A;  [name] will be here 2/21 for 1115 case confirmed 01/16/15 - TS  . HERNIA REPAIR  1981/1982  . HIP SURGERY  1981   Hip Reset   . HIP SURGERY  1990   Plate was reconstructed/ took out growth plate in Right knee  . HIP SURGERY  1992   Plate removed in Left hip  . LEFT AND RIGHT HEART CATHETERIZATION WITH CORONARY ANGIOGRAM N/A 05/18/2014   Procedure: LEFT AND RIGHT HEART CATHETERIZATION WITH CORONARY ANGIOGRAM;  Surgeon: Jettie Booze, MD;  Location: [facility] C[facility];  Service: Cardiovascular;  Laterality: N/A;  . LYMPH NODE BIOPSY  1995  . NASAL SEPTUM SURGERY  2002  . TEE WITHOUT CARDIOVERSION N/A 11/03/2017   Procedure: TRANSESOPHAGEAL ECHOCARDIOGRAM (TEE);  Surgeon: Rexene Alberts, MD;  Location: [facility];  Service: Open Heart Surgery;  Laterality: N/A;  . TONSILLECTOMY AND ADENOIDECTOMY  1990  . TYMPANOSTOMY TUBE PLACEMENT  1981/1982   Social History   Socioeconomic History  . Marital status: Single    Spouse name: Not on file  . Number of children: Not on file  . Years of education: Not on file  . Highest education level: Not on file  Occupational History  . Not on file  Tobacco Use  . Smoking status: Light Tobacco Smoker    Packs/day: 0.50    Years: 12.00    Pack years: 6.00    Types: Cigarettes    Last attempt to quit: 11/02/2017    Years since quitting: 2.7  . Smokeless  tobacco: Never Used  . Tobacco comment: pt given fake cigarette for hand/mouth association. pt using [facility] for support  Vaping Use  . Vaping Use: Never used  Substance and Sexual Activity  . Alcohol use: No    Alcohol/week: 0.0 standard drinks  . Drug use: No  . Sexual activity: Yes    Partners: Male    Birth control/protection: Surgical    Comment: Hysterectomy  Other Topics Concern  . Not on file  Social History Narrative  . Not on file   Social Determinants of Health   Financial Resource Strain:   . Difficulty of Paying Living Expenses: Not on file  Food Insecurity:   . Worried About Charity fundraiser in the Last Year: Not on file  . Ran Out of Food in the Last Year: Not on file  Transportation Needs:   . Lack of Transportation (Medical): Not on file  . Lack of Transportation (Non-Medical): Not on file  Physical Activity:   . Days of Exercise per Week: Not on file  . Minutes of Exercise per Session: Not on file  Stress:   . Feeling of Stress : Not on file  Social Connections:   . Frequency of Communication with Friends and Family: Not on file  . Frequency of Social Gatherings with Friends and Family: Not on file  . Attends Religious Services: Not on file  . Active Member of Clubs or Organizations: Not on file  . Attends Archivist Meetings: Not on file  . Marital Status: Not on file   Allergies  Allergen Reactions  . Demerol Nausea And Vomiting  . Ibuprofen Other (See Comments)    Gastritis   . Codeine Itching   Family History  Problem Relation Age of Onset  . Hyperlipidemia Mother   . Hypertension Mother   . Thyroid disease Mother   . Hyperlipidemia Father   . Hypertension Father   . Migraines Father   . Hypertension Brother   . Hyperlipidemia Brother   . Thyroid disease Maternal Grandmother   . Thyroid disease Maternal Grandfather   . Cancer Neg Hx      Current Outpatient Medications (Cardiovascular):  .  metoprolol  tartrate (LOPRESSOR) 25 MG tablet, Take 0.5 tablets (12.5 mg total) by mouth 2 (two) times daily. .  pravastatin (PRAVACHOL) 20 MG tablet, Take 1 tablet (20 mg total) by mouth daily.  Current Outpatient Medications (Respiratory):  .  albuterol (PROVENTIL HFA;VENTOLIN HFA) 108 (90 Base) MCG/ACT inhaler, Inhale 2 puffs into the lungs every 6 (six) hours as needed for wheezing or shortness of breath.  Current Outpatient Medications (Analgesics):  .  HYDROcodone-acetaminophen (NORCO/VICODIN) 5-325 MG tablet,  .  traMADol (ULTRAM) 50  MG tablet,  .  traMADol (ULTRAM) 50 MG tablet, TAKE 1 TABLET BY MOUTH EVERY 6 HOURS AS NEEDED.  Current Outpatient Medications (Hematological):  .  warfarin (COUMADIN) 7.5 MG tablet, TAKE AS DIRECTED BY COUMADIN CLINIC  Current Outpatient Medications (Other):  Marland Kitchen  DULoxetine (CYMBALTA) 20 MG capsule, Take 1 capsule (20 mg total) by mouth daily. Marland Kitchen  gabapentin (NEURONTIN) 300 MG capsule, TAKE TWO CAPSULES BY MOUTH THREE TIMES A DAY .  lamoTRIgine (LAMICTAL) 200 MG tablet, TAKE TWO TABLETS BY MOUTH DAILY .  lansoprazole (PREVACID) 15 MG capsule, Take 15 mg by mouth daily as needed (for heartburn or acid reflux).  .  meclizine (ANTIVERT) 12.5 MG tablet, Take 1 tablet (12.5 mg total) by mouth 2 (two) times daily as needed for dizziness. .  ondansetron (ZOFRAN) 4 MG tablet, Take 1 tablet (4 mg total) by mouth every 8 (eight) hours as needed for nausea or vomiting.   Reviewed prior external information including notes and imaging from  primary care provider As well as notes that were available from care everywhere and other healthcare systems.  Past medical history, social, surgical and family history all reviewed in electronic medical record.  No pertanent information unless stated regarding to the chief complaint.   Review of Systems:  No headache, visual changes, nausea, vomiting, diarrhea, constipation, dizziness, abdominal pain, skin rash, fevers, chills, night  sweats, weight loss, swollen lymph nodes, body aches, joint swelling, chest pain, shortness of breath, mood changes. POSITIVE muscle aches  Objective  Blood pressure 104/74, pulse 82, height 5' (1.524 m), weight 119 lb (54 kg), SpO2 99 %.   General: No apparent distress alert and oriented x3 mood and affect normal, dressed appropriately.  Patient does have a large laceration over the bridge of the nose.  9 difference stitches counted. With pickups and scissors patient had these removed they were fairly tight.  Clean patient's scab over the bridge of the nose no need for Steri-Strips no blood loss.    Impression and Recommendations:     The above documentation has been reviewed and is accurate and complete Lyndal Pulley, DO       Note: This dictation was prepared with Dragon dictation along with smaller phrase technology. Any transcriptional errors that result from this process are unintentional.

## 2020-07-31 NOTE — Telephone Encounter (Signed)
Spoke with patient she is requesting records.

## 2020-07-31 NOTE — Telephone Encounter (Addendum)
Patient requesting return call from B.Overman only Declined to give additional details Please call

## 2020-07-31 NOTE — Assessment & Plan Note (Signed)
Patient did have a concussion previously and seemed to be resolving but now had another head injury.  Large gash noted on the front and unfortunately sutures are not ready to be removed secondary to patient's blood thinner.  We will wait 2 days and have patient come back at that time.  Patient has pain medications and is on a pain contract unable to do anti-inflammatories secondary to her injury to blood thinners and aortic stenosis.  Continue the duloxetine and at follow-up will consider the possibility of increasing.

## 2020-08-01 ENCOUNTER — Other Ambulatory Visit: Payer: Self-pay | Admitting: Family Medicine

## 2020-08-01 NOTE — Telephone Encounter (Signed)
LVM for patient to inform of : I am unable to print OV notes for [name] office. I spoke with his office and you will need to go through medical records or when patient comes into appointment on Friday, They stated Dr.Smith maybe able to then.

## 2020-08-02 ENCOUNTER — Ambulatory Visit (INDEPENDENT_AMBULATORY_CARE_PROVIDER_SITE_OTHER): Payer: Managed Care, Other (non HMO) | Admitting: Family Medicine

## 2020-08-02 ENCOUNTER — Encounter: Payer: Self-pay | Admitting: Family Medicine

## 2020-08-02 ENCOUNTER — Other Ambulatory Visit: Payer: Self-pay

## 2020-08-02 VITALS — BP 104/74 | HR 82 | Ht 60.0 in | Wt 119.0 lb

## 2020-08-02 DIAGNOSIS — S0181XD Laceration without foreign body of other part of head, subsequent encounter: Secondary | ICD-10-CM | POA: Diagnosis not present

## 2020-08-02 DIAGNOSIS — S060X0D Concussion without loss of consciousness, subsequent encounter: Secondary | ICD-10-CM | POA: Diagnosis not present

## 2020-08-02 NOTE — Assessment & Plan Note (Signed)
Following up in 1 week

## 2020-08-02 NOTE — Patient Instructions (Signed)
Sunscreen on face for next month

## 2020-08-07 ENCOUNTER — Telehealth: Payer: Self-pay | Admitting: Family Medicine

## 2020-08-07 NOTE — Telephone Encounter (Signed)
Patient called stating that since her fall she has had a continuous headache. She said that sometimes it wakes her up but also exhausts her to where she feels like she needs to fall asleep. Any advice on what she can do?

## 2020-08-07 NOTE — Telephone Encounter (Signed)
Attempted to call patient but voicemail box is full. Per a verbal from Dr. Tamala Julian, these symptoms are normal after a head injury and are signs of healing. Patient is to listen to body and sleep if she feels like she needs to rest.

## 2020-08-08 NOTE — Telephone Encounter (Signed)
Spoke with patient. She said that her head hurts constantly. Denies that headaches are worsening. No new injury. Recommend to patient if worsening symptoms to go into ED prior to office visit on Tuesday. Patient voices understanding.

## 2020-08-08 NOTE — Progress Notes (Signed)
[place] [address] [place] Phone: [phone] Subjective:   I Danielle Obrien am serving as a Education administrator for Dr. Hulan Saas.  This visit occurred during the SARS-CoV-2 public health emergency.  Safety protocols were in place, including screening questions prior to the visit, additional usage of staff PPE, and extensive cleaning of exam room while observing appropriate contact time as indicated for disinfecting solutions.   I'm seeing this patient by the request  of:  Janith Lima, MD  CC: Concussion follow-up  [id number]   07/30/2020 Patient did have a concussion previously and seemed to be resolving but now had another head injury.  Large gash noted on the front and unfortunately sutures are not ready to be removed secondary to patient's blood thinner.  We will wait 2 days and have patient come back at that time.  Patient has pain medications and is on a pain contract unable to do anti-inflammatories secondary to her injury to blood thinners and aortic stenosis.  Continue the duloxetine and at follow-up will consider the possibility of increasing.  Update 08/13/2020 Danielle Obrien is a 39 y.o. female coming in with complaint of head injury. Constant headaches and somnolence since last visit. Patient states she is not doing well. Patient states she has a chronic headache that is throbbing. Pain radiates down her back. Bloody nose recently that has happens a few times a week. States it feels like it feels like water is in her ears. Feels like vertigo. Not sleeping well due to headache.       Past Medical History:  Diagnosis Date  . Abnormal Pap smear of cervix    --recurrent ascus w/Pos. HR HPV  . ADD (attention deficit disorder)   . Alcohol abuse, in remission 2012  . Allergy   . Anxiety   . Aortic stenosis due to bicuspid aortic valve 02/20/2014  . Arthritis    In hips  . Asthma    rare inhaler use  . Bipolar disorder  ([place])   . Headache   . Hyperlipidemia with target LDL less than 130 01/27/2013  . Hypertension   . Muscle spasms of both lower extremities    both hips  . 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 Tobacco abuse 09/27/2015  . 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 ABDOMINAL HYSTERECTOMY  02/06/2009   Robotic total laparoscopic hysterectomy  . ANTERIOR CRUCIATE LIGAMENT REPAIR  1993  . AORTIC VALVE REPLACEMENT N/A 11/03/2017   Procedure: MINIMALLY INVASIVE AORTIC VALVE REPLACEMENT( MINI THORACOTOMY);  Surgeon: Rexene Alberts, MD;  Location: [facility];  Service: Open Heart Surgery;  Laterality: N/A;  . CARDIAC CATHETERIZATION  June 2015   no CAD - moderate AS noted  . 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 COLPOSCOPY  10/2008   CIN I & II  . COLPOSCOPY  07/2000   Neg. ECC  . COLPOSCOPY  06/2001   CIN I  . COLPOSCOPY  08/2004   ECC--atypia  . COLPOSCOPY N/A 01/21/2016   Procedure: COLPOSCOPY with vaginal biopsy with CO 2 Laser of Vaginal and vulvar condyloma;  Surgeon: [name], MD;  Location: [facility]  ORS;  Service: Gynecology;  Laterality: N/A;  [name] will be here 2/21 for 1115 case confirmed 01/16/15 - TS  . HERNIA REPAIR  1981/1982  . HIP SURGERY  1981   Hip Reset   . HIP SURGERY  1990   Plate was reconstructed/ took out growth plate in Right knee  . HIP SURGERY  1992   Plate removed in Left hip  . LEFT AND RIGHT HEART CATHETERIZATION WITH CORONARY ANGIOGRAM N/A 05/18/2014   Procedure: LEFT AND RIGHT HEART CATHETERIZATION WITH CORONARY ANGIOGRAM;  Surgeon: Jettie Booze, MD;  Location: [facility] CATH LAB;  Service: Cardiovascular;  Laterality: N/A;  . LYMPH NODE BIOPSY  1995   . NASAL SEPTUM SURGERY  2002  . TEE WITHOUT CARDIOVERSION N/A 11/03/2017   Procedure: TRANSESOPHAGEAL ECHOCARDIOGRAM (TEE);  Surgeon: Rexene Alberts, MD;  Location: [facility];  Service: Open Heart Surgery;  Laterality: N/A;  . TONSILLECTOMY AND ADENOIDECTOMY  1990  . TYMPANOSTOMY TUBE PLACEMENT  1981/1982   Social History   Socioeconomic History  . Marital status: Single    Spouse name: Not on file  . Number of children: Not on file  . Years of education: Not on file  . Highest education level: Not on file  Occupational History  . Not on file  Tobacco Use  . Smoking status: Light Tobacco Smoker    Packs/day: 0.50    Years: 12.00    Pack years: 6.00    Types: Cigarettes    Last attempt to quit: 11/02/2017    Years since quitting: 2.7  . Smokeless tobacco: Never Used  . Tobacco comment: pt given fake cigarette for hand/mouth association. pt using [facility] for support  Vaping Use  . Vaping Use: Never used  Substance and Sexual Activity  . Alcohol use: No    Alcohol/week: 0.0 standard drinks  . Drug use: No  . Sexual activity: Yes    Partners: Male    Birth control/protection: Surgical    Comment: Hysterectomy  Other Topics Concern  . Not on file  Social History Narrative  . Not on file   Social Determinants of Health   Financial Resource Strain:   . Difficulty of Paying Living Expenses: Not on file  Food Insecurity:   . Worried About Charity fundraiser in the Last Year: Not on file  . Ran Out of Food in the Last Year: Not on file  Transportation Needs:   . Lack of Transportation (Medical): Not on file  . Lack of Transportation (Non-Medical): Not on file  Physical Activity:   . Days of Exercise per Week: Not on file  . Minutes of Exercise per Session: Not on file  Stress:   . Feeling of Stress : Not on file  Social Connections:   . Frequency of Communication with Friends and Family: Not on file  . Frequency of Social Gatherings with Friends and Family:  Not on file  . Attends Religious Services: Not on file  . Active Member of Clubs or Organizations: Not on file  . Attends Archivist Meetings: Not on file  . Marital Status: Not on file   Allergies  Allergen Reactions  . Demerol Nausea And Vomiting  . Ibuprofen Other (See Comments)    Gastritis   . Codeine Itching   Family History  Problem Relation Age of Onset  . Hyperlipidemia Mother   . Hypertension Mother   . Thyroid disease Mother   . Hyperlipidemia Father   . Hypertension  Father   . Migraines Father   . Hypertension Brother   . Hyperlipidemia Brother   . Thyroid disease Maternal Grandmother   . Thyroid disease Maternal Grandfather   . Cancer Neg Hx      Current Outpatient Medications (Cardiovascular):  .  metoprolol tartrate (LOPRESSOR) 25 MG tablet, Take 0.5 tablets (12.5 mg total) by mouth 2 (two) times daily. .  pravastatin (PRAVACHOL) 20 MG tablet, Take 1 tablet (20 mg total) by mouth daily.  Current Outpatient Medications (Respiratory):  .  albuterol (PROVENTIL HFA;VENTOLIN HFA) 108 (90 Base) MCG/ACT inhaler, Inhale 2 puffs into the lungs every 6 (six) hours as needed for wheezing or shortness of breath.  Current Outpatient Medications (Analgesics):  .  HYDROcodone-acetaminophen (NORCO/VICODIN) 5-325 MG tablet,  .  traMADol (ULTRAM) 50 MG tablet,  .  traMADol (ULTRAM) 50 MG tablet, TAKE 1 TABLET BY MOUTH EVERY 6 HOURS AS NEEDED.  Current Outpatient Medications (Hematological):  .  warfarin (COUMADIN) 7.5 MG tablet, TAKE AS DIRECTED BY COUMADIN CLINIC  Current Outpatient Medications (Other):  Marland Kitchen  DULoxetine (CYMBALTA) 20 MG capsule, Take 1 capsule (20 mg total) by mouth daily. Marland Kitchen  gabapentin (NEURONTIN) 300 MG capsule, TAKE TWO CAPSULES BY MOUTH THREE TIMES A DAY .  lamoTRIgine (LAMICTAL) 200 MG tablet, TAKE TWO TABLETS BY MOUTH DAILY .  lansoprazole (PREVACID) 15 MG capsule, Take 15 mg by mouth daily as needed (for heartburn or acid reflux).  .   meclizine (ANTIVERT) 12.5 MG tablet, Take 1 tablet (12.5 mg total) by mouth 2 (two) times daily as needed for dizziness. .  ondansetron (ZOFRAN) 4 MG tablet, Take 1 tablet (4 mg total) by mouth every 8 (eight) hours as needed for nausea or vomiting. .  DULoxetine (CYMBALTA) 30 MG capsule, Take 1 capsule (30 mg total) by mouth daily.   Reviewed prior external information including notes and imaging from  primary care provider As well as notes that were available from care everywhere and other healthcare systems.  Past medical history, social, surgical and family history all reviewed in electronic medical record.  No pertanent information unless stated regarding to the chief complaint.   Review of Systems:  No  visual changes, nausea, vomiting, diarrhea, constipation,  abdominal pain, skin rash, fevers, chills, night sweats, weight loss, swollen lymph nodes, body aches, joint swelling, chest pain, shortness of breath, mood changes. POSITIVE muscle aches, headache, dizziness  Objective  Blood pressure 130/90, pulse 76, height 5' (1.524 m), weight 119 lb (54 kg), SpO2 98 %.   General: No apparent distress alert and oriented x3 mood and affect normal, dressed appropriately.  Patient is healing from the laceration on the bridge of the nose HEENT: Pupils equal, extraocular movements intact bruising around eyes is better. Respiratory: Patient's speak in full sentences and does not appear short of breath  Cardiovascular: No lower extremity edema, non tender, no erythema  Neuro: Cranial nerves II through XII are intact, neurovascularly intact in all extremities with 2+ DTRs and 2+ pulses.  MSK: Patient still walks mildly antalgic.    Impression and Recommendations:     The above documentation has been reviewed and is accurate and complete Lyndal Pulley, DO       Note: This dictation was prepared with Dragon dictation along with smaller phrase technology. Any transcriptional errors that  result from this process are unintentional.

## 2020-08-13 ENCOUNTER — Telehealth: Payer: Self-pay | Admitting: Family Medicine

## 2020-08-13 ENCOUNTER — Other Ambulatory Visit: Payer: Self-pay

## 2020-08-13 ENCOUNTER — Ambulatory Visit (INDEPENDENT_AMBULATORY_CARE_PROVIDER_SITE_OTHER): Payer: Managed Care, Other (non HMO) | Admitting: Family Medicine

## 2020-08-13 ENCOUNTER — Encounter: Payer: Self-pay | Admitting: Family Medicine

## 2020-08-13 DIAGNOSIS — S060X0D Concussion without loss of consciousness, subsequent encounter: Secondary | ICD-10-CM

## 2020-08-13 MED ORDER — DULOXETINE HCL 30 MG PO CPEP: 30.0000 mg | ORAL_CAPSULE | Freq: Every day | ORAL | 3 refills | Status: DC

## 2020-08-13 NOTE — Telephone Encounter (Signed)
Pt seen this morning. Her work note states she can go back to work "next Monday", it was her understanding that she would not go back to work until the following Monday, 9/27.

## 2020-08-13 NOTE — Telephone Encounter (Signed)
Sent note via mychart. Notified patient.

## 2020-08-13 NOTE — Assessment & Plan Note (Signed)
Once again difficult balance in this individual with other comorbidities.  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  Because of this and patient's other modalities and unable to use certain medications we will very slowly increase patient's activity.  Patient is already done vestibular neuro with the first concussion and will continue with the home exercises, increase the Cymbalta for more of a posttraumatic headaches and hopefully that this will help with any of the compounding depression that could also be playing a small role.  Patient is to start work again in about 13 days but 1 shift for the first week, 2 shifts the second week, 3 weeks for the following week.  I would like to see patient again before we fully released patient.  Total time reviewing chart as well as discussing with patient today 33 minutes

## 2020-08-13 NOTE — Patient Instructions (Signed)
Cymbalta 30 mg Continue tylenol See me again in 4 weeks

## 2020-08-22 ENCOUNTER — Encounter: Payer: Managed Care, Other (non HMO) | Admitting: Family Medicine

## 2020-08-22 ENCOUNTER — Encounter: Payer: Self-pay | Admitting: Family Medicine

## 2020-08-22 NOTE — Progress Notes (Signed)
No answer

## 2020-08-23 ENCOUNTER — Telehealth: Payer: Self-pay | Admitting: Family Medicine

## 2020-08-23 NOTE — Telephone Encounter (Signed)
Patient called stating that she missed her phone appointment yesterday. She said that her fiance passed away on 03-16-23 and she has been dealing with the funeral. She did not realize she made that appointment. She said she was very sorry.  Would you like to reschedule this?  ** She was not able to get her letter off MyChart so I printed it and she is picking up a copy today.

## 2020-08-26 NOTE — Telephone Encounter (Signed)
Spoke with patient. She needs to come in as she believes that she has part of a stitch left in laceration. Patient on schedule tomorrow.

## 2020-08-26 NOTE — Progress Notes (Signed)
[place] Phone: [phone] Subjective:   Fontaine No, am serving as a scribe for Dr. Hulan Saas. This visit occurred during the SARS-CoV-2 public health emergency.  Safety protocols were in place, including screening questions prior to the visit, additional usage of staff PPE, and extensive cleaning of exam room while observing appropriate contact time as indicated for disinfecting solutions.   I'm seeing this patient by the request  of:  Janith Lima, MD  CC: Face laceration follow-up, concussion follow-up  [id number]  Danielle Obrien is a 39 y.o. female coming in with complaint of concerns that she still has part of a stitch in her healing laceration. Stitches removed on 08/02/2020. Patient still notes a small piece of nylon in her skin. Is suppose to return to work on October 2nd.   Patient states that she is feeling good otherwise.  Did recently go have the loss of her fianc who was stage IV cancer.     Past Medical History:  Diagnosis Date  . Abnormal Pap smear of cervix    --recurrent ascus w/Pos. HR HPV  . ADD (attention deficit disorder)   . Alcohol abuse, in remission 2012  . Allergy   . Anxiety   . Aortic stenosis due to bicuspid aortic valve 02/20/2014  . Arthritis    In hips  . Asthma    rare inhaler use  . Bipolar disorder ([place])   . Headache   . Hyperlipidemia with target LDL less than 130 01/27/2013  . Hypertension   . Muscle spasms of both lower extremities    both hips  . 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 Tobacco abuse 09/27/2015  . 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 ABDOMINAL HYSTERECTOMY  02/06/2009    Robotic total laparoscopic hysterectomy  . ANTERIOR CRUCIATE LIGAMENT REPAIR  1993  . AORTIC VALVE REPLACEMENT N/A 11/03/2017   Procedure: MINIMALLY INVASIVE AORTIC VALVE REPLACEMENT( MINI THORACOTOMY);  Surgeon: Rexene Alberts, MD;  Location: [facility];  Service: Open Heart Surgery;  Laterality: N/A;  . CARDIAC CATHETERIZATION  June 2015   no CAD - moderate AS noted  . 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 COLPOSCOPY  10/2008   CIN I & II  . COLPOSCOPY  07/2000   Neg. ECC  . COLPOSCOPY  06/2001   CIN I  . COLPOSCOPY  08/2004   ECC--atypia  . COLPOSCOPY N/A 01/21/2016   Procedure: COLPOSCOPY with vaginal biopsy with CO 2 Laser of Vaginal and vulvar condyloma;  Surgeon: [name], MD;  Location: [facility];  Service: Gynecology;  Laterality: N/A;  [name] will be here 2/21 for 1115 case confirmed 01/16/15 - TS  . HERNIA REPAIR  1981/1982  . HIP SURGERY  1981   Hip Reset   . HIP SURGERY  1990   Plate was reconstructed/ took out growth plate in Right knee  . HIP SURGERY  1992   Plate removed in Left hip  . LEFT AND RIGHT HEART CATHETERIZATION WITH CORONARY ANGIOGRAM N/A 05/18/2014   Procedure: LEFT AND RIGHT HEART CATHETERIZATION WITH CORONARY ANGIOGRAM;  Surgeon: Jettie Booze,  MD;  Location: [facility] CATH LAB;  Service: Cardiovascular;  Laterality: N/A;  . LYMPH NODE BIOPSY  1995  . NASAL SEPTUM SURGERY  2002  . TEE WITHOUT CARDIOVERSION N/A 11/03/2017   Procedure: TRANSESOPHAGEAL ECHOCARDIOGRAM (TEE);  Surgeon: Rexene Alberts, MD;  Location: [facility];  Service: Open Heart Surgery;  Laterality: N/A;  . TONSILLECTOMY AND ADENOIDECTOMY  1990  . TYMPANOSTOMY TUBE PLACEMENT  1981/1982   Social History   Socioeconomic History  . Marital status: Single    Spouse name: Not on file  . Number of children: Not on file  . Years of education: Not on file  . Highest education level: Not on file  Occupational History  . Not on file  Tobacco  Use  . Smoking status: Light Tobacco Smoker    Packs/day: 0.50    Years: 12.00    Pack years: 6.00    Types: Cigarettes    Last attempt to quit: 11/02/2017    Years since quitting: 2.8  . Smokeless tobacco: Never Used  . Tobacco comment: pt given fake cigarette for hand/mouth association. pt using [facility] for support  Vaping Use  . Vaping Use: Never used  Substance and Sexual Activity  . Alcohol use: No    Alcohol/week: 0.0 standard drinks  . Drug use: No  . Sexual activity: Yes    Partners: Male    Birth control/protection: Surgical    Comment: Hysterectomy  Other Topics Concern  . Not on file  Social History Narrative  . Not on file   Social Determinants of Health   Financial Resource Strain:   . Difficulty of Paying Living Expenses: Not on file  Food Insecurity:   . Worried About Charity fundraiser in the Last Year: Not on file  . Ran Out of Food in the Last Year: Not on file  Transportation Needs:   . Lack of Transportation (Medical): Not on file  . Lack of Transportation (Non-Medical): Not on file  Physical Activity:   . Days of Exercise per Week: Not on file  . Minutes of Exercise per Session: Not on file  Stress:   . Feeling of Stress : Not on file  Social Connections:   . Frequency of Communication with Friends and Family: Not on file  . Frequency of Social Gatherings with Friends and Family: Not on file  . Attends Religious Services: Not on file  . Active Member of Clubs or Organizations: Not on file  . Attends Archivist Meetings: Not on file  . Marital Status: Not on file   Allergies  Allergen Reactions  . Demerol Nausea And Vomiting  . Ibuprofen Other (See Comments)    Gastritis   . Codeine Itching   Family History  Problem Relation Age of Onset  . Hyperlipidemia Mother   . Hypertension Mother   . Thyroid disease Mother   . Hyperlipidemia Father   . Hypertension Father   . Migraines Father   . Hypertension Brother   .  Hyperlipidemia Brother   . Thyroid disease Maternal Grandmother   . Thyroid disease Maternal Grandfather   . Cancer Neg Hx      Current Outpatient Medications (Cardiovascular):  .  metoprolol tartrate (LOPRESSOR) 25 MG tablet, Take 0.5 tablets (12.5 mg total) by mouth 2 (two) times daily. .  pravastatin (PRAVACHOL) 20 MG tablet, Take 1 tablet (20 mg total) by mouth daily.  Current Outpatient Medications (Respiratory):  .  albuterol (PROVENTIL HFA;VENTOLIN HFA) 108 (90  Base) MCG/ACT inhaler, Inhale 2 puffs into the lungs every 6 (six) hours as needed for wheezing or shortness of breath.  Current Outpatient Medications (Analgesics):  .  HYDROcodone-acetaminophen (NORCO/VICODIN) 5-325 MG tablet,  .  traMADol (ULTRAM) 50 MG tablet,  .  traMADol (ULTRAM) 50 MG tablet, TAKE 1 TABLET BY MOUTH EVERY 6 HOURS AS NEEDED.  Current Outpatient Medications (Hematological):  .  warfarin (COUMADIN) 7.5 MG tablet, TAKE AS DIRECTED BY COUMADIN CLINIC  Current Outpatient Medications (Other):  Marland Kitchen  DULoxetine (CYMBALTA) 20 MG capsule, Take 1 capsule (20 mg total) by mouth daily. .  DULoxetine (CYMBALTA) 30 MG capsule, Take 1 capsule (30 mg total) by mouth daily. Marland Kitchen  gabapentin (NEURONTIN) 300 MG capsule, TAKE TWO CAPSULES BY MOUTH THREE TIMES A DAY .  lamoTRIgine (LAMICTAL) 200 MG tablet, TAKE TWO TABLETS BY MOUTH DAILY .  lansoprazole (PREVACID) 15 MG capsule, Take 15 mg by mouth daily as needed (for heartburn or acid reflux).  .  meclizine (ANTIVERT) 12.5 MG tablet, Take 1 tablet (12.5 mg total) by mouth 2 (two) times daily as needed for dizziness. .  ondansetron (ZOFRAN) 4 MG tablet, Take 1 tablet (4 mg total) by mouth every 8 (eight) hours as needed for nausea or vomiting.   Reviewed prior external information including notes and imaging from  primary care provider As well as notes that were available from care everywhere and other healthcare systems.  Past medical history, social, surgical and  family history all reviewed in electronic medical record.  No pertanent information unless stated regarding to the chief complaint.   Review of Systems:  No headache, visual changes, nausea, vomiting, diarrhea, constipation, dizziness, abdominal pain, skin rash, fevers, chills, night sweats, weight loss, swollen lymph nodes, body aches, joint swelling, chest pain, shortness of breath, mood changes. POSITIVE muscle aches  Objective  Blood pressure 120/82, pulse 73, height 5' (1.524 m), weight 123 lb (55.8 kg), SpO2 99 %.   General: No apparent distress alert and oriented x3 mood and affect normal, dressed appropriately.  HEENT: Pupils equal, extraocular movements intact patient scar has 2 little small holes noted that could be corresponding to where stitches were but no stitch appreciated at the moment.  Did take off the top 2 layers of skin in the area and still did not see a true stitch or anything on the edge.  Attempted to pick up in the edges with pickups and unable to do so no true foreign body palpated. Respiratory: Patient's speak in full sentences and does not appear short of breath  Cardiovascular: No lower extremity edema, non tender, no erythema       Impression and Recommendations:     The above documentation has been reviewed and is accurate and complete Lyndal Pulley, DO       Note: This dictation was prepared with Dragon dictation along with smaller phrase technology. Any transcriptional errors that result from this process are unintentional.

## 2020-08-27 ENCOUNTER — Encounter: Payer: Self-pay | Admitting: Family Medicine

## 2020-08-27 ENCOUNTER — Ambulatory Visit (INDEPENDENT_AMBULATORY_CARE_PROVIDER_SITE_OTHER): Payer: Managed Care, Other (non HMO) | Admitting: Family Medicine

## 2020-08-27 ENCOUNTER — Other Ambulatory Visit: Payer: Self-pay

## 2020-08-27 DIAGNOSIS — S060X0D Concussion without loss of consciousness, subsequent encounter: Secondary | ICD-10-CM | POA: Diagnosis not present

## 2020-08-27 NOTE — Patient Instructions (Signed)
Watch for next 2 weeks I do not think anything is there Keep doing lotion Sorry for your loss Send a message in 2 weeks to let us know how you are doing

## 2020-08-27 NOTE — Assessment & Plan Note (Signed)
Patient seems to be doing much better with compression at this time.  No signs of any nystagmus.  Once again attempted to potentially remove any states that would be left but it is difficult to find any edges.  I think it is more secondary to the healing and this was the deepest part of the tear.  We did discuss the possibility of referral to dermatology which patient declined.  We discussed that we could cut down but that would stop the healing and patient is healing relatively well.  Patient is also on Coumadin still.  Patient would like to give it 2 more weeks and then will get back to [place] if it continues to give her difficulties.

## 2020-08-29 ENCOUNTER — Other Ambulatory Visit: Payer: Self-pay

## 2020-08-29 ENCOUNTER — Ambulatory Visit (INDEPENDENT_AMBULATORY_CARE_PROVIDER_SITE_OTHER): Payer: Managed Care, Other (non HMO)

## 2020-08-29 DIAGNOSIS — Z7901 Long term (current) use of anticoagulants: Secondary | ICD-10-CM | POA: Diagnosis not present

## 2020-08-29 DIAGNOSIS — Z954 Presence of other heart-valve replacement: Secondary | ICD-10-CM | POA: Diagnosis not present

## 2020-08-29 DIAGNOSIS — Q230 Congenital stenosis of aortic valve: Secondary | ICD-10-CM

## 2020-08-29 DIAGNOSIS — Q231 Congenital insufficiency of aortic valve: Secondary | ICD-10-CM | POA: Diagnosis not present

## 2020-08-29 LAB — POCT INR: INR: 2.4 (ref 2.0–3.0)

## 2020-08-29 NOTE — Patient Instructions (Signed)
Description   Continue taking Warfarin 1 tablet daily except 1.5 tablets on Sundays and Thursdays. Recheck INR in 6 weeks. [facility] #[phone] Main #[phone]     

## 2020-09-10 ENCOUNTER — Other Ambulatory Visit: Payer: Self-pay | Admitting: Family Medicine

## 2020-09-18 ENCOUNTER — Other Ambulatory Visit: Payer: Self-pay | Admitting: Family Medicine

## 2020-10-06 ENCOUNTER — Other Ambulatory Visit: Payer: Self-pay | Admitting: Internal Medicine

## 2020-10-06 DIAGNOSIS — F3177 Bipolar disorder, in partial remission, most recent episode mixed: Secondary | ICD-10-CM

## 2020-10-06 DIAGNOSIS — F411 Generalized anxiety disorder: Secondary | ICD-10-CM

## 2020-10-10 ENCOUNTER — Other Ambulatory Visit: Payer: Self-pay

## 2020-10-10 ENCOUNTER — Ambulatory Visit (INDEPENDENT_AMBULATORY_CARE_PROVIDER_SITE_OTHER): Payer: Managed Care, Other (non HMO) | Admitting: *Deleted

## 2020-10-10 DIAGNOSIS — Z7901 Long term (current) use of anticoagulants: Secondary | ICD-10-CM | POA: Diagnosis not present

## 2020-10-10 DIAGNOSIS — Q230 Congenital stenosis of aortic valve: Secondary | ICD-10-CM

## 2020-10-10 DIAGNOSIS — Z954 Presence of other heart-valve replacement: Secondary | ICD-10-CM

## 2020-10-10 DIAGNOSIS — Q231 Congenital insufficiency of aortic valve: Secondary | ICD-10-CM

## 2020-10-10 LAB — POCT INR: INR: 3.6 — AB (ref 2.0–3.0)

## 2020-10-10 NOTE — Patient Instructions (Signed)
Description   Do not take any Warfarin tomorrow (already taken today's dose) then continue taking Warfarin 1 tablet daily except 1.5 tablets on Sundays and Thursdays. Recheck INR in 4 weeks. Cou[facility]in Clinic [phone] Main [phone]

## 2020-10-15 ENCOUNTER — Other Ambulatory Visit: Payer: Self-pay | Admitting: Family Medicine

## 2020-10-15 ENCOUNTER — Telehealth: Payer: Self-pay | Admitting: Cardiology

## 2020-10-15 MED ORDER — AMOXICILLIN 500 MG PO CAPS: 2000.0000 mg | ORAL_CAPSULE | Freq: Once | ORAL | 0 refills | Status: AC

## 2020-10-15 NOTE — Telephone Encounter (Signed)
She needs Amoxicillin 2gm PO to be taken 1 hour before dental procedure

## 2020-10-15 NOTE — Telephone Encounter (Signed)
Spoke with the patient and advised her to take amoxicillin 2 g one hour prior to dental procedure. Patient was very thankful and verbalized understanding.

## 2020-10-15 NOTE — Telephone Encounter (Signed)
Danielle Obrien is calling stating she is needing antibiotics for her appointment with her dentist tomorrow. She states she is getting two abscess that burst looked at. She was advised she needs antibiotics every time she has a dentist appointment and the dentist advised her to call Dr. Radford Pax to have it called in. Please advise.

## 2020-10-16 NOTE — Telephone Encounter (Signed)
Patient called following up on this refill request. 

## 2020-10-20 ENCOUNTER — Other Ambulatory Visit: Payer: Self-pay | Admitting: Cardiology

## 2020-10-26 ENCOUNTER — Other Ambulatory Visit: Payer: Self-pay | Admitting: Family Medicine

## 2020-11-07 ENCOUNTER — Other Ambulatory Visit: Payer: Self-pay

## 2020-11-07 ENCOUNTER — Ambulatory Visit (INDEPENDENT_AMBULATORY_CARE_PROVIDER_SITE_OTHER): Payer: Managed Care, Other (non HMO) | Admitting: *Deleted

## 2020-11-07 DIAGNOSIS — Q231 Congenital insufficiency of aortic valve: Secondary | ICD-10-CM

## 2020-11-07 DIAGNOSIS — Z7901 Long term (current) use of anticoagulants: Secondary | ICD-10-CM | POA: Diagnosis not present

## 2020-11-07 DIAGNOSIS — Q230 Congenital stenosis of aortic valve: Secondary | ICD-10-CM

## 2020-11-07 DIAGNOSIS — Z954 Presence of other heart-valve replacement: Secondary | ICD-10-CM

## 2020-11-07 LAB — POCT INR: INR: 2.5 (ref 2.0–3.0)

## 2020-11-07 NOTE — Patient Instructions (Signed)
Description   Continue taking Warfarin 1 tablet daily except 1.5 tablets on Sundays and Thursdays. Recheck INR in 4 weeks. Coumadin Clinic [phone] Main [phone]

## 2020-11-13 ENCOUNTER — Other Ambulatory Visit: Payer: Self-pay | Admitting: Family Medicine

## 2020-11-19 ENCOUNTER — Other Ambulatory Visit: Payer: Self-pay | Admitting: Internal Medicine

## 2020-11-19 DIAGNOSIS — E785 Hyperlipidemia, unspecified: Secondary | ICD-10-CM

## 2020-12-11 ENCOUNTER — Other Ambulatory Visit: Payer: Self-pay | Admitting: Family Medicine

## 2020-12-13 ENCOUNTER — Telehealth: Payer: Self-pay | Admitting: Family Medicine

## 2020-12-13 NOTE — Telephone Encounter (Signed)
Patient called requesting a refill on her Tramadol 50mg  Tablet to be sent to [facility] [address].  Please advise.

## 2020-12-16 NOTE — Telephone Encounter (Signed)
Can I get a refill request from pharmacy

## 2020-12-17 NOTE — Telephone Encounter (Signed)
Sent patient MyChart message regarding medication.

## 2020-12-19 ENCOUNTER — Telehealth: Payer: Self-pay

## 2020-12-19 ENCOUNTER — Other Ambulatory Visit: Payer: Self-pay | Admitting: Family Medicine

## 2020-12-19 NOTE — Telephone Encounter (Signed)
Spoke with Danielle Obrien. Per Dr. Tamala Julian he recommends that Danielle Obrien go thru pharmacy for request. Danielle Obrien voices understanding. Danielle Obrien also would like to schedule visit. Placed on schedule in a few weeks.

## 2020-12-19 NOTE — Telephone Encounter (Signed)
Patient called following up on this request. 

## 2020-12-19 NOTE — Telephone Encounter (Signed)
PDMP reviewed.  Tramadol last prescribed December 16.

## 2020-12-19 NOTE — Telephone Encounter (Signed)
Patient would like a call back to discuss tramadol refill

## 2020-12-26 ENCOUNTER — Other Ambulatory Visit: Payer: Self-pay

## 2020-12-26 ENCOUNTER — Ambulatory Visit (INDEPENDENT_AMBULATORY_CARE_PROVIDER_SITE_OTHER): Payer: Managed Care, Other (non HMO) | Admitting: *Deleted

## 2020-12-26 DIAGNOSIS — Z954 Presence of other heart-valve replacement: Secondary | ICD-10-CM

## 2020-12-26 DIAGNOSIS — Z7901 Long term (current) use of anticoagulants: Secondary | ICD-10-CM

## 2020-12-26 LAB — POCT INR: INR: 2.5 (ref 2.0–3.0)

## 2020-12-26 NOTE — Patient Instructions (Signed)
Description   Continue taking Warfarin 1 tablet daily except 1.5 tablets on Sundays and Thursdays. Recheck INR in 6 weeks. [facility] #[phone] Main #[phone]     

## 2021-01-02 ENCOUNTER — Other Ambulatory Visit: Payer: Self-pay

## 2021-01-02 ENCOUNTER — Encounter: Payer: Self-pay | Admitting: Family Medicine

## 2021-01-02 ENCOUNTER — Ambulatory Visit (INDEPENDENT_AMBULATORY_CARE_PROVIDER_SITE_OTHER): Payer: Managed Care, Other (non HMO) | Admitting: Family Medicine

## 2021-01-02 VITALS — BP 132/86 | HR 74 | Ht 60.0 in | Wt 115.0 lb

## 2021-01-02 DIAGNOSIS — M542 Cervicalgia: Secondary | ICD-10-CM | POA: Diagnosis not present

## 2021-01-02 DIAGNOSIS — M5030 Other cervical disc degeneration, unspecified cervical region: Secondary | ICD-10-CM | POA: Diagnosis not present

## 2021-01-02 DIAGNOSIS — M2550 Pain in unspecified joint: Secondary | ICD-10-CM | POA: Diagnosis not present

## 2021-01-02 DIAGNOSIS — J342 Deviated nasal septum: Secondary | ICD-10-CM | POA: Insufficient documentation

## 2021-01-02 DIAGNOSIS — F1190 Opioid use, unspecified, uncomplicated: Secondary | ICD-10-CM

## 2021-01-02 LAB — COMPREHENSIVE METABOLIC PANEL
ALT: 13 U/L (ref 0–35)
AST: 20 U/L (ref 0–37)
Albumin: 4.1 g/dL (ref 3.5–5.2)
Alkaline Phosphatase: 95 U/L (ref 39–117)
BUN: 11 mg/dL (ref 6–23)
CO2: 27 mEq/L (ref 19–32)
Calcium: 9.2 mg/dL (ref 8.4–10.5)
Chloride: 105 mEq/L (ref 96–112)
Creatinine, Ser: 0.75 mg/dL (ref 0.40–1.20)
GFR: 99.43 mL/min (ref >60.00)
Glucose, Bld: 86 mg/dL (ref 70–99)
Potassium: 4.6 mEq/L (ref 3.5–5.1)
Sodium: 138 mEq/L (ref 135–145)
Total Bilirubin: 0.4 mg/dL (ref 0.2–1.2)
Total Protein: 6.6 g/dL (ref 6.0–8.3)

## 2021-01-02 LAB — CBC WITH DIFFERENTIAL/PLATELET
Basophils Absolute: 0 10*3/uL (ref 0.0–0.1)
Basophils Relative: 0.8 % (ref 0.0–3.0)
Eosinophils Absolute: 0.2 10*3/uL (ref 0.0–0.7)
Eosinophils Relative: 3.4 % (ref 0.0–5.0)
HCT: 42.2 % (ref 36.0–46.0)
Hemoglobin: 14.1 g/dL (ref 12.0–15.0)
Lymphocytes Relative: 31.3 % (ref 12.0–46.0)
Lymphs Abs: 1.6 10*3/uL (ref 0.7–4.0)
MCHC: 33.5 g/dL (ref 30.0–36.0)
MCV: 92.4 fl (ref 78.0–100.0)
Monocytes Absolute: 0.5 10*3/uL (ref 0.1–1.0)
Monocytes Relative: 9.3 % (ref 3.0–12.0)
Neutro Abs: 2.9 10*3/uL (ref 1.4–7.7)
Neutrophils Relative %: 55.2 % (ref 43.0–77.0)
Platelets: 151 10*3/uL (ref 150.0–400.0)
RBC: 4.56 Mil/uL (ref 3.87–5.11)
RDW: 14.1 % (ref 11.5–15.5)
WBC: 5.2 10*3/uL (ref 4.0–10.5)

## 2021-01-02 LAB — SEDIMENTATION RATE: Sed Rate: 11 mm/hr (ref 0–20)

## 2021-01-02 LAB — T3, FREE: T3, Free: 3.7 pg/mL (ref 2.3–4.2)

## 2021-01-02 LAB — TSH: TSH: 0.8 u[IU]/mL (ref 0.35–4.50)

## 2021-01-02 LAB — T4, FREE: Free T4: 0.83 ng/dL (ref 0.60–1.60)
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
## 2021-01-02 NOTE — Assessment & Plan Note (Signed)
Known arthritic changes of the neck at this time.  Patient states that more the pain seems to be anterior more than posterior.  Patient did have the MRI showing the patient did have the possibility of a tracheal or esophageal diverticulum versus the potential for thyroid nodule.  Will get labs for the TSH with patient being a little low previously.  Does have a family history of hyperthyroidism.  Follow-up with me after the imaging as well as going to formal physical therapy for the degenerative disc disease.  Follow-up 2 months
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
## 2021-01-02 NOTE — Patient Instructions (Addendum)
ENT will call you CT neck of soft tissue No changes in meds for now-next visit discuss decreasing gabapentin to 4 pills total in 1 day as well as discuss Tramadol to 1 pill, 3x a day as well as discuss going up in cymbalta PT will call you If more trouble swallowing would like to consider GI docs See me again in 2 months

## 2021-01-02 NOTE — Assessment & Plan Note (Signed)
Patient is on tramadol.  We did check.  Database recently with the last refill.  Patient is only getting it for me at this moment.  Patient is taking 100 mg twice daily.  Will attempt to try to decrease to 150 mg after winter is the goal.  We will see how patient responds to the other conservative therapies and continuing with the duloxetine with the idea of potentially increasing to 40 mg.

## 2021-01-03 ENCOUNTER — Other Ambulatory Visit: Payer: Self-pay | Admitting: Internal Medicine

## 2021-01-03 DIAGNOSIS — F3177 Bipolar disorder, in partial remission, most recent episode mixed: Secondary | ICD-10-CM

## 2021-01-03 DIAGNOSIS — F411 Generalized anxiety disorder: Secondary | ICD-10-CM

## 2021-01-09 NOTE — Addendum Note (Signed)
Addended by: Douglass Rivers T on: 01/09/2021 09:43 AM   Modules accepted: Orders

## 2021-01-19 ENCOUNTER — Other Ambulatory Visit: Payer: Self-pay | Admitting: Family Medicine

## 2021-01-20 ENCOUNTER — Other Ambulatory Visit: Payer: Self-pay | Admitting: Family Medicine

## 2021-01-25 ENCOUNTER — Other Ambulatory Visit: Payer: Self-pay | Admitting: Family Medicine

## 2021-02-06 ENCOUNTER — Other Ambulatory Visit: Payer: Self-pay

## 2021-02-06 ENCOUNTER — Ambulatory Visit (INDEPENDENT_AMBULATORY_CARE_PROVIDER_SITE_OTHER): Payer: Managed Care, Other (non HMO) | Admitting: *Deleted

## 2021-02-06 DIAGNOSIS — Q230 Congenital stenosis of aortic valve: Secondary | ICD-10-CM

## 2021-02-06 DIAGNOSIS — Q231 Congenital insufficiency of aortic valve: Secondary | ICD-10-CM

## 2021-02-06 DIAGNOSIS — Z7901 Long term (current) use of anticoagulants: Secondary | ICD-10-CM

## 2021-02-06 DIAGNOSIS — Z954 Presence of other heart-valve replacement: Secondary | ICD-10-CM

## 2021-02-06 LAB — POCT INR: INR: 2.4 (ref 2.0–3.0)

## 2021-02-06 NOTE — Patient Instructions (Signed)
Description   Continue taking Warfarin 1 tablet daily except 1.5 tablets on Sundays and Thursdays. Recheck INR in 6 weeks. [facility] [phone] Main [phone]

## 2021-02-19 ENCOUNTER — Other Ambulatory Visit: Payer: Self-pay | Admitting: Family Medicine

## 2021-02-19 MED ORDER — TRAMADOL HCL 50 MG PO TABS: 100.0000 mg | ORAL_TABLET | Freq: Two times a day (BID) | ORAL | 0 refills | Status: DC

## 2021-02-19 NOTE — Addendum Note (Signed)
Addended by: Lyndal Pulley on: 02/19/2021 02:40 PM   Modules accepted: Orders

## 2021-02-19 NOTE — Telephone Encounter (Signed)
Reviewed database  Refill sent in

## 2021-02-19 NOTE — Telephone Encounter (Signed)
Patient called following up on this request.  Please advise.

## 2021-02-26 NOTE — Progress Notes (Deleted)
[place] Phone: [phone] Subjective:    I'm seeing this patient by the request  of:  Janith Lima, MD  CC:   [id number]   01/02/2021 Patient is on tramadol.  We did check.  Database recently with the last refill.  Patient is only getting it for me at this moment.  Patient is taking 100 mg twice daily.  Will attempt to try to decrease to 150 mg after winter is the goal.  We will see how patient responds to the other conservative therapies and continuing with the duloxetine with the idea of potentially increasing to 40 mg.  Known arthritic changes of the neck at this time.  Patient states that more the pain seems to be anterior more than posterior.  Patient did have the MRI showing the patient did have the possibility of a tracheal or esophageal diverticulum versus the potential for thyroid nodule.  Will get labs for the TSH with patient being a little low previously.  Does have a family history of hyperthyroidism.  Follow-up with me after the imaging as well as going to formal physical therapy for the degenerative disc disease.  Follow-up 2 months  Update 02/27/2021 Danielle Obrien is a 40 y.o. female coming in with complaint of neck pain. Patient has not gotten CT of neck that was ordered last visit. Was also referred to ENT. Patient states   Onset-  Location Duration-  Character- Aggravating factors- Reliving factors-  Therapies tried-  Severity-     Past Medical History:  Diagnosis Date  . Abnormal Pap smear of cervix    --recurrent ascus w/Pos. HR HPV  . ADD (attention deficit disorder)   . Alcohol abuse, in remission 2012  . Allergy   . Anxiety   . Aortic stenosis due to bicuspid aortic valve 02/20/2014  . Arthritis    In hips  . Asthma    rare inhaler use  . Bipolar disorder ([place])   . Headache   . Hyperlipidemia with target LDL less than 130 01/27/2013  . Hypertension   . Muscle spasms  of both lower extremities    both hips  . 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 Tobacco abuse 09/27/2015  . 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 ABDOMINAL HYSTERECTOMY  02/06/2009   Robotic total laparoscopic hysterectomy  . ANTERIOR CRUCIATE LIGAMENT REPAIR  1993  . AORTIC VALVE REPLACEMENT N/A 11/03/2017   Procedure: MINIMALLY INVASIVE AORTIC VALVE REPLACEMENT( MINI THORACOTOMY);  Surgeon: Rexene Alberts, MD;  Location: [facility];  Service: Open Heart Surgery;  Laterality: N/A;  . CARDIAC CATHETERIZATION  June 2015   no CAD - moderate AS noted  . 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 COLPOSCOPY  10/2008   CIN I & II  . COLPOSCOPY  07/2000   Neg. ECC  . COLPOSCOPY  06/2001   CIN I  . COLPOSCOPY  08/2004   ECC--atypia  . COLPOSCOPY N/A 01/21/2016   Procedure: COLPOSCOPY with vaginal biopsy with CO 2 Laser of Vaginal and vulvar condyloma;  Surgeon: [name], MD;  Location: [facility];  Service: Gynecology;  Laterality: N/A;  [name] will be here 2/21 for 1115 case confirmed 01/16/15 - TS  . HERNIA REPAIR  1981/1982  . HIP SURGERY  1981   Hip Reset   . HIP SURGERY  1990   Plate was reconstructed/ took out growth plate in Right knee  . HIP SURGERY  1992   Plate removed in Left hip  . LEFT AND RIGHT HEART CATHETERIZATION WITH CORONARY ANGIOGRAM N/A 05/18/2014   Procedure: LEFT AND RIGHT HEART CATHETERIZATION WITH CORONARY ANGIOGRAM;  Surgeon: Jettie Booze, MD;  Location: [facility] CATH LAB;  Service: Cardiovascular;  Laterality: N/A;  . LYMPH NODE BIOPSY  1995  . NASAL SEPTUM SURGERY  2002  . TEE WITHOUT CARDIOVERSION N/A 11/03/2017   Procedure: TRANSESOPHAGEAL  ECHOCARDIOGRAM (TEE);  Surgeon: Rexene Alberts, MD;  Location: [place];  Service: Open Heart Surgery;  Laterality: N/A;  . TONSILLECTOMY AND ADENOIDECTOMY  1990  . TYMPANOSTOMY TUBE PLACEMENT  1981/1982   Social History   Socioeconomic History  . Marital status: Single    Spouse name: Not on file  . Number of children: Not on file  . Years of education: Not on file  . Highest education level: Not on file  Occupational History  . Not on file  Tobacco Use  . Smoking status: Light Tobacco Smoker    Packs/day: 0.50    Years: 12.00    Pack years: 6.00    Types: Cigarettes    Last attempt to quit: 11/02/2017    Years since quitting: 3.3  . Smokeless tobacco: Never Used  . Tobacco comment: pt given fake cigarette for hand/mouth association. pt using [facility] for support  Vaping Use  . Vaping Use: Never used  Substance and Sexual Activity  . Alcohol use: No    Alcohol/week: 0.0 standard drinks  . Drug use: No  . Sexual activity: Yes    Partners: Male    Birth control/protection: Surgical    Comment: Hysterectomy  Other Topics Concern  . Not on file  Social History Narrative  . Not on file   Social Determinants of Health   Financial Resource Strain: Not on file  Food Insecurity: Not on file  Transportation Needs: Not on file  Physical Activity: Not on file  Stress: Not on file  Social Connections: Not on file   Allergies  Allergen Reactions  . Demerol Nausea And Vomiting  . Ibuprofen Other (See Comments)    Gastritis   . Codeine Itching   Family History  Problem Relation Age of Onset  . Hyperlipidemia Mother   . Hypertension Mother   . Thyroid disease Mother   . Hyperlipidemia Father   . Hypertension Father   . Migraines Father   . Hypertension Brother   . Hyperlipidemia Brother   . Thyroid disease Maternal Grandmother   . Thyroid disease Maternal Grandfather   . Cancer Neg Hx      Current Outpatient Medications (Cardiovascular):  .  metoprolol  tartrate (LOPRESSOR) 25 MG tablet, Take 0.5 tablets (12.5 mg total) by mouth 2 (two) times daily. .  pravastatin (PRAVACHOL) 20 MG tablet, TAKE ONE TABLET BY MOUTH DAILY  Current Outpatient Medications (Respiratory):  .  albuterol (PROVENTIL HFA;VENTOLIN HFA) 108 (90 Base) MCG/ACT inhaler, Inhale 2 puffs into the lungs every 6 (six) hours as needed for wheezing or shortness of breath.  Current Outpatient Medications (Analgesics):  .  traMADol (ULTRAM) 50 MG tablet,  .  traMADol (ULTRAM) 50 MG tablet, TAKE ONE TABLET BY  MOUTH EVERY 6 HOURS AS NEEDED .  traMADol (ULTRAM) 50 MG tablet, TAKE ONE TABLET BY MOUTH EVERY 6 HOURS AS NEEDED .  traMADol (ULTRAM) 50 MG tablet, TAKE ONE TABLET BY MOUTH EVERY 6 HOURS AS NEEDED .  traMADol (ULTRAM) 50 MG tablet, TAKE ONE TABLET BY MOUTH EVERY 6 HOURS AS NEEDED .  traMADol (ULTRAM) 50 MG tablet, TAKE ONE TABLET BY MOUTH EVERY 6 HOURS AS NEEDED .  traMADol (ULTRAM) 50 MG tablet, Take 2 tablets (100 mg total) by mouth 2 (two) times daily.  Current Outpatient Medications (Hematological):  .  warfarin (COUMADIN) 7.5 MG tablet, TAKE AS DIRECTED BY COUMADIN CLINIC *30 DAYS SUPPLY*  Current Outpatient Medications (Other):  Marland Kitchen  DULoxetine (CYMBALTA) 20 MG capsule, Take 1 capsule (20 mg total) by mouth daily. .  DULoxetine (CYMBALTA) 30 MG capsule, TAKE ONE CAPSULE BY MOUTH DAILY .  gabapentin (NEURONTIN) 300 MG capsule, TAKE TWO CAPSULES BY MOUTH THREE TIMES A DAY .  lamoTRIgine (LAMICTAL) 200 MG tablet, TAKE TWO TABLETS BY MOUTH DAILY .  lansoprazole (PREVACID) 15 MG capsule, Take 15 mg by mouth daily as needed (for heartburn or acid reflux).  .  meclizine (ANTIVERT) 12.5 MG tablet, TAKE ONE TABLET BY MOUTH TWICE A DAY AS NEEDED FOR DIZZINESS .  ondansetron (ZOFRAN) 4 MG tablet, Take 1 tablet (4 mg total) by mouth every 8 (eight) hours as needed for nausea or vomiting.   Reviewed prior external information including notes and imaging from  primary care  provider As well as notes that were available from care everywhere and other healthcare systems.  Past medical history, social, surgical and family history all reviewed in electronic medical record.  No pertanent information unless stated regarding to the chief complaint.   Review of Systems:  No headache, visual changes, nausea, vomiting, diarrhea, constipation, dizziness, abdominal pain, skin rash, fevers, chills, night sweats, weight loss, swollen lymph nodes, body aches, joint swelling, chest pain, shortness of breath, mood changes. POSITIVE muscle aches  Objective  There were no vitals taken for this visit.   General: No apparent distress alert and oriented x3 mood and affect normal, dressed appropriately.  HEENT: Pupils equal, extraocular movements intact  Respiratory: Patient's speak in full sentences and does not appear short of breath  Cardiovascular: No lower extremity edema, non tender, no erythema  Gait normal with good balance and coordination.  MSK:  Non tender with full range of motion and good stability and symmetric strength and tone of shoulders, elbows, wrist, hip, knee and ankles bilaterally.     Impression and Recommendations:     The above documentation has been reviewed and is accurate and complete Jacqualin Combes

## 2021-02-27 ENCOUNTER — Ambulatory Visit: Payer: Managed Care, Other (non HMO) | Admitting: Family Medicine

## 2021-02-28 NOTE — Progress Notes (Signed)
[facility] [address] [place] Progress Phone: [phone] Subjective:   I Danielle Obrien am serving as a Education administrator for Dr. Hulan Saas.  This visit occurred during the SARS-CoV-2 public health emergency.  Safety protocols were in place, including screening questions prior to the visit, additional usage of staff PPE, and extensive cleaning of exam room while observing appropriate contact time as indicated for disinfecting solutions.   I'm seeing this patient by the request  of:  Janith Lima, MD  CC: Neck pain, back pain follow-up  RSW:[id number]   01/02/2021 Patient is on tramadol.  We did check.  Database recently with the last refill.  Patient is only getting it for me at this moment.  Patient is taking 100 mg twice daily.  Will attempt to try to decrease to 150 mg after winter is the goal.  We will see how patient responds to the other conservative therapies and continuing with the duloxetine with the idea of potentially increasing to 40 mg.  Known arthritic changes of the neck at this time.  Patient states that more the pain seems to be anterior more than posterior.  Patient did have the MRI showing the patient did have the possibility of a tracheal or esophageal diverticulum versus the potential for thyroid nodule.  Will get labs for the TSH with patient being a little low previously.  Does have a family history of hyperthyroidism.  Follow-up with me after the imaging as well as going to formal physical therapy for the degenerative disc disease.  Follow-up 2 months  Update 03/03/2021 Danielle Obrien is a 40 y.o. female coming in with complaint of neck pain. Patient states she needs a restart on the neck issues. States she has also been dealing with sinus issues and states she now has coughing under control.  Patient has not been able to get any of the test done at this point.  Patient is looking forward to getting it done here in the near future.   Patient does feel that the pain though unfortunately has had a severe amount that she is unable to decrease her tramadol at this time.  Patient does want to get off of it at some point.  Patient feels no significant side effects to the Cymbalta at this point though.     Past Medical History:  Diagnosis Date  . Abnormal Pap smear of cervix    --recurrent ascus w/Pos. HR HPV  . ADD (attention deficit disorder)   . Alcohol abuse, in remission 2012  . Allergy   . Anxiety   . Aortic stenosis due to bicuspid aortic valve 02/20/2014  . Arthritis    In hips  . Asthma    rare inhaler use  . Bipolar disorder ([place])   . Headache   . Hyperlipidemia with target LDL less than 130 01/27/2013  . Hypertension   . Muscle spasms of both lower extremities    both hips  . 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 Tobacco abuse 09/27/2015  . 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 ABDOMINAL HYSTERECTOMY  02/06/2009   Robotic total laparoscopic hysterectomy  . ANTERIOR CRUCIATE LIGAMENT REPAIR  1993  . AORTIC VALVE REPLACEMENT N/A 11/03/2017  Procedure: MINIMALLY INVASIVE AORTIC VALVE REPLACEMENT( MINI THORACOTOMY);  Surgeon: Rexene Alberts, MD;  Location: [facility];  Service: Open Heart Surgery;  Laterality: N/A;  . CARDIAC CATHETERIZATION  June 2015   no CAD - moderate AS noted  . 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 COLPOSCOPY  10/2008   CIN I & II  . COLPOSCOPY  07/2000   Neg. ECC  . COLPOSCOPY  06/2001   CIN I  . COLPOSCOPY  08/2004   ECC--atypia  . COLPOSCOPY N/A 01/21/2016   Procedure: COLPOSCOPY with vaginal biopsy with CO 2 Laser of Vaginal and vulvar condyloma;  Surgeon: [name], MD;  Location: [facility];  Service: Gynecology;  Laterality: N/A;  [name] will be here 2/21 for 1115 case confirmed 01/16/15 - TS  . HERNIA REPAIR  1981/1982  . HIP SURGERY  1981   Hip Reset   . HIP SURGERY  1990   Plate was reconstructed/ took out growth plate in Right knee  . HIP SURGERY  1992   Plate removed in Left hip  . LEFT AND RIGHT HEART CATHETERIZATION WITH CORONARY ANGIOGRAM N/A 05/18/2014   Procedure: LEFT AND RIGHT HEART CATHETERIZATION WITH CORONARY ANGIOGRAM;  Surgeon: Jettie Booze, MD;  Location: [facility] CATH [facility];  Service: Cardiovascular;  Laterality: N/A;  . LYMPH NODE BIOPSY  1995  . NASAL SEPTUM SURGERY  2002  . TEE WITHOUT CARDIOVERSION N/A 11/03/2017   Procedure: TRANSESOPHAGEAL ECHOCARDIOGRAM (TEE);  Surgeon: Rexene Alberts, MD;  Location: [facility];  Service: Open Heart Surgery;  Laterality: N/A;  . TONSILLECTOMY AND ADENOIDECTOMY  1990  . TYMPANOSTOMY TUBE PLACEMENT  1981/1982   Social History   Socioeconomic History  . Marital status: Single    Spouse name: Not on file  . Number of children: Not on file  . Years of education: Not on file  . Highest education level: Not on file  Occupational History  . Not on file  Tobacco Use  . Smoking status: Light Tobacco Smoker    Packs/day: 0.50    Years: 12.00    Pack years: 6.00    Types: Cigarettes    Last attempt to quit: 11/02/2017    Years since quitting: 3.3  . Smokeless tobacco: Never Used  . Tobacco comment: pt given fake cigarette for hand/mouth association. pt using [facility] for support  Vaping Use  . Vaping Use: Never used  Substance and Sexual Activity  . Alcohol use: No    Alcohol/week: 0.0 standard drinks  . Drug use: No  . Sexual activity: Yes    Partners: Male    Birth control/protection: Surgical    Comment: Hysterectomy  Other Topics Concern  . Not on file  Social History Narrative  . Not on file   Social Determinants of Health   Financial Resource Strain: Not on file   Food Insecurity: Not on file  Transportation Needs: Not on file  Physical Activity: Not on file  Stress: Not on file  Social Connections: Not on file   Allergies  Allergen Reactions  . Demerol Nausea And Vomiting  . Ibuprofen Other (See Comments)    Gastritis   . Codeine Itching   Family History  Problem Relation Age of Onset  . Hyperlipidemia Mother   . Hypertension Mother   . Thyroid disease Mother   . Hyperlipidemia Father   . Hypertension Father   . Migraines Father   .  Hypertension Brother   . Hyperlipidemia Brother   . Thyroid disease Maternal Grandmother   . Thyroid disease Maternal Grandfather   . Cancer Neg Hx      Current Outpatient Medications (Cardiovascular):  .  metoprolol tartrate (LOPRESSOR) 25 MG tablet, Take 0.5 tablets (12.5 mg total) by mouth 2 (two) times daily. .  pravastatin (PRAVACHOL) 20 MG tablet, TAKE ONE TABLET BY MOUTH DAILY  Current Outpatient Medications (Respiratory):  .  albuterol (PROVENTIL HFA;VENTOLIN HFA) 108 (90 Base) MCG/ACT inhaler, Inhale 2 puffs into the lungs every 6 (six) hours as needed for wheezing or shortness of breath.  Current Outpatient Medications (Analgesics):  .  traMADol (ULTRAM) 50 MG tablet,  .  traMADol (ULTRAM) 50 MG tablet, TAKE ONE TABLET BY MOUTH EVERY 6 HOURS AS NEEDED .  traMADol (ULTRAM) 50 MG tablet, TAKE ONE TABLET BY MOUTH EVERY 6 HOURS AS NEEDED .  traMADol (ULTRAM) 50 MG tablet, TAKE ONE TABLET BY MOUTH EVERY 6 HOURS AS NEEDED .  traMADol (ULTRAM) 50 MG tablet, TAKE ONE TABLET BY MOUTH EVERY 6 HOURS AS NEEDED .  traMADol (ULTRAM) 50 MG tablet, TAKE ONE TABLET BY MOUTH EVERY 6 HOURS AS NEEDED .  traMADol (ULTRAM) 50 MG tablet, Take 2 tablets (100 mg total) by mouth 2 (two) times daily.  Current Outpatient Medications (Hematological):  .  warfarin (COUMADIN) 7.5 MG tablet, TAKE AS DIRECTED BY COUMADIN CLINIC FOR 30 DAY SUPPLY  Current Outpatient Medications (Other):  Marland Kitchen  DULoxetine (CYMBALTA) 20 MG  capsule, Take 1 capsule (20 mg total) by mouth daily. .  DULoxetine (CYMBALTA) 30 MG capsule, TAKE ONE CAPSULE BY MOUTH DAILY .  gabapentin (NEURONTIN) 300 MG capsule, TAKE TWO CAPSULES BY MOUTH THREE TIMES A DAY .  lamoTRIgine (LAMICTAL) 200 MG tablet, TAKE TWO TABLETS BY MOUTH DAILY .  lansoprazole (PREVACID) 15 MG capsule, Take 15 mg by mouth daily as needed (for heartburn or acid reflux).  .  meclizine (ANTIVERT) 12.5 MG tablet, TAKE ONE TABLET BY MOUTH TWICE A DAY AS NEEDED FOR DIZZINESS .  ondansetron (ZOFRAN) 4 MG tablet, Take 1 tablet (4 mg total) by mouth every 8 (eight) hours as needed for nausea or vomiting.   Reviewed prior external information including notes and imaging from  primary care provider As well as notes that were available from care everywhere and other healthcare systems.  Past medical history, social, surgical and family history all reviewed in electronic medical record.  No pertanent information unless stated regarding to the chief complaint.   Review of Systems:  No headache, visual changes, nausea, vomiting, diarrhea, constipation, dizziness, abdominal pain, skin rash, fevers, chills, night sweats, weight loss, swollen lymph nodes,  joint swelling, chest pain, shortness of breath, mood changes. POSITIVE muscle aches, body aches  Objective  Blood pressure (!) 160/90, pulse 68, height 5' (1.524 m), weight 115 lb (52.2 kg), SpO2 98 %.   General: No apparent distress alert and oriented x3 mood and affect normal, dressed appropriately.  HEENT: Pupils equal, extraocular movements intact  Respiratory: Patient's speak in full sentences and does not appear short of breath  Cardiovascular: No lower extremity edema, non tender, no erythema  Gait antalgic MSK:   Patient does have a significant leg length discrepancy noted with left leg being significantly shorter. Low back does have tenderness diffusely in the paraspinal musculature.  Tightness noted with FABER test  bilaterally left greater than right.  Patient does have a negative straight leg test but tightness of the hamstring on  the right.  Patient's neck does have decreased range of motion noted as well.  Patient still has fullness noted of the soft tissue in the neck.  No specific masses are felt.    Impression and Recommendations:     The above documentation has been reviewed and is accurate and complete Lyndal Pulley, DO

## 2021-03-03 ENCOUNTER — Encounter: Payer: Self-pay | Admitting: Family Medicine

## 2021-03-03 ENCOUNTER — Ambulatory Visit (INDEPENDENT_AMBULATORY_CARE_PROVIDER_SITE_OTHER): Payer: Managed Care, Other (non HMO) | Admitting: Family Medicine

## 2021-03-03 ENCOUNTER — Other Ambulatory Visit: Payer: Self-pay

## 2021-03-03 ENCOUNTER — Other Ambulatory Visit: Payer: Self-pay | Admitting: Cardiology

## 2021-03-03 DIAGNOSIS — J342 Deviated nasal septum: Secondary | ICD-10-CM

## 2021-03-03 DIAGNOSIS — M5030 Other cervical disc degeneration, unspecified cervical region: Secondary | ICD-10-CM | POA: Diagnosis not present

## 2021-03-03 DIAGNOSIS — M2170 Unequal limb length (acquired), unspecified site: Secondary | ICD-10-CM

## 2021-03-03 DIAGNOSIS — F1190 Opioid use, unspecified, uncomplicated: Secondary | ICD-10-CM | POA: Diagnosis not present

## 2021-03-03 NOTE — Patient Instructions (Addendum)
Good to see you Schedule CT [phone] Will keep at the same dose of tramadol Continue Cymbalta Sent a referral for orthotics Heel lift in left shoe until you get your orthotics PT [address].  See me again in 3 months

## 2021-03-03 NOTE — Assessment & Plan Note (Signed)
We will send patient for other orthotics that I hope will be beneficial.  Given a heel lift today to see if this will be helpful.

## 2021-03-03 NOTE — Assessment & Plan Note (Signed)
We will check the database previously.  Patient is using it appropriately with medication at this time.  Goal is to change patient know to 150 mg hopefully at follow-up if her total limit day instead of the 200 she is taking at this moment.

## 2021-03-03 NOTE — Assessment & Plan Note (Signed)
Continue degenerative disc disease.  Patient is unable to come off of the tramadol at this time.  Patient feels like some of it is secondary to the leg length discrepancy.  We will get her other orthotics that I think will be beneficial.  Continue the Cymbalta at this time.  Goal will be in 2 to 3 months when we see it again to try to turn down the tramadol to 50 mg 3 times a day.  Then also consider the possibility of increasing Cymbalta to 40 mg.  Follow-up with me again 2 to 3 months.

## 2021-03-03 NOTE — Assessment & Plan Note (Signed)
Awaiting ENT.

## 2021-03-13 ENCOUNTER — Ambulatory Visit (INDEPENDENT_AMBULATORY_CARE_PROVIDER_SITE_OTHER): Payer: Managed Care, Other (non HMO) | Admitting: Sports Medicine

## 2021-03-13 ENCOUNTER — Other Ambulatory Visit: Payer: Self-pay

## 2021-03-13 ENCOUNTER — Encounter: Payer: Self-pay | Admitting: Sports Medicine

## 2021-03-13 VITALS — BP 142/90 | Ht 60.0 in | Wt 116.0 lb

## 2021-03-13 DIAGNOSIS — M2170 Unequal limb length (acquired), unspecified site: Secondary | ICD-10-CM

## 2021-03-13 NOTE — Assessment & Plan Note (Signed)
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  Left leg measures 3 cm less than right.   -will try green inserts with  3/16 inch scaphoid insert and heal lift in left shoe, given extra 3/16 inch heal lift and can use if needed -follow up in 4 week

## 2021-03-13 NOTE — Progress Notes (Addendum)
.     SUBJECTIVE:   CHIEF COMPLAINT / HPI: here for orthotics  Patient reports she was referred by her doctor for orthotics. Has had left leg discrepancy since birth. Has tried multiple heel lifts without relief.  Most recent lift causing pain and swelling of left foot. Walks with limp and reports leans more toward left side when more fatigued.  Uses Tramadol for pain but not helpful.  She is trying to avoid another hip surgery.    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 Examination of her feet in the standing position shows collapse of longitudinal arches and transverses arches bilaterally with subsequent claw toe deformity.   Evaluation of her gait shows a dropping of her left shoulder when ambulating.  Pronation on the left, neutral gait on the right.  No limp.  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  Left leg measures 3 cm less than right.   -will try green inserts with  3/16 inch scaphoid insert and heal lift in left shoe, given extra 3/16 inch heal lift and can use if needed -follow up in 4 week    Carollee Leitz, MD [place]   Patient seen and evaluated with the resident.  I agree with the above plan of care.  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  This has left her with a significant leg length discrepancy.  She has a rather large heel lift now which is causing her significant forefoot pain and swelling so she has discontinued wearing it.  She also states that if she gets a lot of pressure in her arch it is uncomfortable.  I recommend that we try a green sports insole  with a scaphoid pad and a 3/16 inch heel lift on the left instead of custom orthotics.  Evaluation of her gait with the insert in place shows less dropping of her left shoulder when walking.  It is not a full correction but the patient finds it to be comfortable.  We did provide her with an additional 3/16 inch heel lift to add to the insert if she would like.  She will return to the office in 4 weeks for reevaluation.  Best course of action for her may be to simply keep her in the green insert instead of a custom orthotic as she was very pleased with how this felt before leaving the office.

## 2021-03-19 ENCOUNTER — Other Ambulatory Visit: Payer: Self-pay | Admitting: Family Medicine

## 2021-03-20 ENCOUNTER — Ambulatory Visit (INDEPENDENT_AMBULATORY_CARE_PROVIDER_SITE_OTHER): Payer: Managed Care, Other (non HMO) | Admitting: *Deleted

## 2021-03-20 ENCOUNTER — Other Ambulatory Visit: Payer: Self-pay

## 2021-03-20 DIAGNOSIS — Z954 Presence of other heart-valve replacement: Secondary | ICD-10-CM

## 2021-03-20 DIAGNOSIS — Z7901 Long term (current) use of anticoagulants: Secondary | ICD-10-CM

## 2021-03-20 LAB — POCT INR: INR: 1.9 — AB (ref 2.0–3.0)

## 2021-03-20 NOTE — Patient Instructions (Signed)
Description    Take 1.5 tablets of warfarin tomorrow, then continue on the same dose of warfarin 1 tablet daily except for 1.5 tablets on Sundays and Thursdays. Recheck INR in 4 weeks. [facility] [phone].

## 2021-04-04 ENCOUNTER — Other Ambulatory Visit: Payer: Self-pay | Admitting: Internal Medicine

## 2021-04-04 DIAGNOSIS — F3177 Bipolar disorder, in partial remission, most recent episode mixed: Secondary | ICD-10-CM

## 2021-04-04 DIAGNOSIS — F411 Generalized anxiety disorder: Secondary | ICD-10-CM

## 2021-04-10 ENCOUNTER — Ambulatory Visit (INDEPENDENT_AMBULATORY_CARE_PROVIDER_SITE_OTHER): Payer: Managed Care, Other (non HMO) | Admitting: Sports Medicine

## 2021-04-10 ENCOUNTER — Other Ambulatory Visit: Payer: Self-pay

## 2021-04-10 VITALS — BP 148/90 | Ht 60.0 in | Wt 116.0 lb

## 2021-04-10 DIAGNOSIS — M25562 Pain in left knee: Secondary | ICD-10-CM

## 2021-04-10 DIAGNOSIS — M545 Low back pain, unspecified: Secondary | ICD-10-CM | POA: Diagnosis not present

## 2021-04-10 DIAGNOSIS — R200 Anesthesia of skin: Secondary | ICD-10-CM

## 2021-04-10 DIAGNOSIS — G8929 Other chronic pain: Secondary | ICD-10-CM | POA: Diagnosis not present

## 2021-04-10 DIAGNOSIS — R202 Paresthesia of skin: Secondary | ICD-10-CM

## 2021-04-10 NOTE — Progress Notes (Signed)
Office Visit Note   Patient: Danielle Obrien           Date of Birth: [birth year]           MRN: [id number] Visit Date: 04/10/2021 Requested by: Janith Lima, MD [place],  [place] [address] PCP: Janith Lima, MD  Subjective: CC: Left knee pain, swelling  HPI: 40 year old female presenting to clinic with concerns of left knee pain and swelling.  Patient with history of leg length discrepancy, and was given a heel lift a little less than 1 month ago.  She states that shortly after starting to wear this heel lift her left knee started to ache and became significantly swollen.  She did not want to remove the heel lift, as she says that it did improve her other musculoskeletal symptoms.  She has a history of ACL repair in the affected knee, but it has been many years since the surgery.  No new trauma.  She says that she reduce the frequency of wearing the heel lift throughout the day, saying that she does not want to stop it entirely but is worried that it will continue to flare her knee.  She endorses occasional "popping" of her knee, but no mechanical locking or catching.  No sensations of instability.  She says "my knee can tell the weather."  Endorses stiffness and aching, worse in the medial aspect.  No numbness or tingling. She says she has a history of "difficulty feeling my legs."  Patient endorses falling frequently because of this, but says she has never been worked up.  No bowel or bladder dysfunction.  She describes that if her phone vibrates in her pocket, she will not be able to feel it.  She has tried gabapentin in the past, with no significant improvement.              ROS:   All other systems were reviewed and are negative.  Objective: Vital Signs: BP (!) 148/90   Ht 5' (1.524 m)   Wt 116 lb (52.6 kg)   BMI 22.65 kg/m  No flowsheet data found.   No flowsheet data found.  Physical Exam:  General:  Alert and oriented, in no acute distress. Pulm:   Breathing unlabored. Psy:  Normal mood, congruent affect. Skin: Left knee without bruises, rashes, or erythema. Overlying skin intact.  Does have approximately 4 cm in diameter ecchymosis on lower left shin, which appears to be appropriately healing.  Overlying skin intact. Left knee exam:  General: Minimally antalgic gait, favoring left leg. Standing exam: No varus or valgus deformity of the knee.   Seated Exam:  No patellar crepitus.  Left knee with full range of motion in flexion and extension.  Patient states this is dramatically improved from approximately 1 week ago.  Palpation: Tenderness over the medial joint line.  No tenderness laterally or patellar compression.  No tenderness over patellar tendon.  Supine exam: Moderate effusion, normal patellar mobility.   Ligamentous Exam:  No pain or laxity with anterior/posterior drawer.  No obvious Sag.  Endorses some discomfort with valgus stress across the knee, however she does not display knee laxity.  No pain or laxity with varus stress across the knee.  Meniscus:  [place]ray with discomfort at medial aspect.  Clicking appreciated, though this feels to be patellar in origin.  Strength: Hip flexion (L1), Hip Aduction (L2), Knee Extension (L3) are 5/5 Bilaterally Foot Inversion (L4), Dorsiflexion (L5), and Eversion (S1) 5/5  Bilaterally  Sensation: Intact to light touch medial and lateral aspects of lower extremities, and lateral, dorsal, and medial aspects of foot.    Imaging: No results found.  Assessment & Plan: 40 year old female presenting to clinic with concerns of left knee pain and swelling since starting to wear a heel lift approximately 1 month ago.  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 -We will start conservative therapy with knee and hip strengthening and stabilization exercises, to help her  body adjust to leg length discrepancy correction, and the gait changes associated with this. -Patient wishes to continue to wear heel lift.  Optimistic her symptoms should improve with aforementioned exercises and compression. -Ice knee as needed. -Patient has mechanical heart valve, breast cannot use oral NSAIDs.  If her symptoms do not improve with the above, would consider steroid injection to the left knee. -We will obtain x-rays of her knee today to evaluate for degenerative changes. -Return to clinic in approximately 1 month for further evaluation.  Per her reported decreased sensation in bilateral legs, which patient states has been ongoing "all my life" we will obtain a lumbar x-ray for initial evaluation.  Could consider neurology referral in the future if x-ray is unremarkable.  Patient had no further questions or concerns today.  She expressed understanding with plan.   Patient seen and evaluated with the sports medicine fellow.  I agree with the above plan of care.  Patient has found her 3/16 inch heel lift to be very helpful but this has unfortunately likely aggravated some arthritis in her left knee.  Treatment as above including x-rays and follow-up with me again in 4 weeks.  We did discuss merits of a cortisone injection but patient elected to wait on that for now.  We also tried a body helix compression sleeve but she found it to be uncomfortable.

## 2021-04-15 ENCOUNTER — Other Ambulatory Visit: Payer: Self-pay | Admitting: Family Medicine

## 2021-04-17 ENCOUNTER — Other Ambulatory Visit: Payer: Self-pay

## 2021-04-17 ENCOUNTER — Encounter: Payer: Self-pay | Admitting: Internal Medicine

## 2021-04-17 ENCOUNTER — Ambulatory Visit: Payer: Managed Care, Other (non HMO) | Admitting: Internal Medicine

## 2021-04-17 ENCOUNTER — Ambulatory Visit (INDEPENDENT_AMBULATORY_CARE_PROVIDER_SITE_OTHER): Payer: Managed Care, Other (non HMO)

## 2021-04-17 VITALS — BP 130/76 | HR 77 | Temp 98.1°F | Resp 16 | Ht 60.0 in | Wt 116.0 lb

## 2021-04-17 DIAGNOSIS — Z7901 Long term (current) use of anticoagulants: Secondary | ICD-10-CM

## 2021-04-17 DIAGNOSIS — Q231 Congenital insufficiency of aortic valve: Secondary | ICD-10-CM

## 2021-04-17 DIAGNOSIS — Z954 Presence of other heart-valve replacement: Secondary | ICD-10-CM | POA: Diagnosis not present

## 2021-04-17 DIAGNOSIS — F3177 Bipolar disorder, in partial remission, most recent episode mixed: Secondary | ICD-10-CM

## 2021-04-17 DIAGNOSIS — I10 Essential (primary) hypertension: Secondary | ICD-10-CM

## 2021-04-17 DIAGNOSIS — F411 Generalized anxiety disorder: Secondary | ICD-10-CM

## 2021-04-17 DIAGNOSIS — Q230 Congenital stenosis of aortic valve: Secondary | ICD-10-CM | POA: Diagnosis not present

## 2021-04-17 LAB — POCT INR: INR: 2.7 (ref 2.0–3.0)

## 2021-04-17 MED ORDER — LAMOTRIGINE 200 MG PO TABS: 400.0000 mg | ORAL_TABLET | Freq: Every day | ORAL | 1 refills | Status: DC

## 2021-04-17 NOTE — Patient Instructions (Signed)

## 2021-04-17 NOTE — Progress Notes (Signed)
Subjective:  Patient ID: Danielle Obrien, female    DOB: Dec 22, 1980  Age: 40 y.o. MRN: [id number]  CC: Hypertension  This visit occurred during the SARS-CoV-2 public health emergency.  Safety protocols were in place, including screening questions prior to the visit, additional usage of staff PPE, and extensive cleaning of exam room while observing appropriate contact time as indicated for disinfecting solutions.    HPI Danielle Obrien presents for f/up -  Her BP has been well controlled. She wants to continue the current dose of lamictal.  Outpatient Medications Prior to Visit  Medication Sig Dispense Refill  . albuterol (PROVENTIL HFA;VENTOLIN HFA) 108 (90 Base) MCG/ACT inhaler Inhale 2 puffs into the lungs every 6 (six) hours as needed for wheezing or shortness of breath. 1 Inhaler 3  . DULoxetine (CYMBALTA) 30 MG capsule TAKE ONE CAPSULE BY MOUTH DAILY 30 capsule 3  . gabapentin (NEURONTIN) 300 MG capsule TAKE TWO CAPSULES BY MOUTH THREE TIMES A DAY 540 capsule 0  . lansoprazole (PREVACID) 15 MG capsule Take 15 mg by mouth daily as needed (for heartburn or acid reflux).     . meclizine (ANTIVERT) 12.5 MG tablet TAKE ONE TABLET BY MOUTH TWICE A DAY AS NEEDED FOR DIZZINESS 20 tablet 0  . metoprolol tartrate (LOPRESSOR) 25 MG tablet Take 0.5 tablets (12.5 mg total) by mouth 2 (two) times daily. 90 tablet 2  . ondansetron (ZOFRAN) 4 MG tablet Take 1 tablet (4 mg total) by mouth every 8 (eight) hours as needed for nausea or vomiting. 20 tablet 0  . pravastatin (PRAVACHOL) 20 MG tablet TAKE ONE TABLET BY MOUTH DAILY 90 tablet 1  . traMADol (ULTRAM) 50 MG tablet TAKE TWO TABLETS BY MOUTH TWICE A DAY 120 tablet 0  . warfarin (COUMADIN) 7.5 MG tablet TAKE AS DIRECTED BY COUMADIN CLINIC FOR 30 DAY SUPPLY 40 tablet 3  . DULoxetine (CYMBALTA) 20 MG capsule Take 1 capsule (20 mg total) by mouth daily. 90 capsule 1  . lamoTRIgine (LAMICTAL) 200 MG tablet TAKE TWO TABLETS BY MOUTH DAILY 180 tablet  0  . traMADol (ULTRAM) 50 MG tablet     . traMADol (ULTRAM) 50 MG tablet TAKE ONE TABLET BY MOUTH EVERY 6 HOURS AS NEEDED 120 tablet 0  . traMADol (ULTRAM) 50 MG tablet TAKE ONE TABLET BY MOUTH EVERY 6 HOURS AS NEEDED 120 tablet 0  . traMADol (ULTRAM) 50 MG tablet TAKE ONE TABLET BY MOUTH EVERY 6 HOURS AS NEEDED 120 tablet 0  . traMADol (ULTRAM) 50 MG tablet TAKE ONE TABLET BY MOUTH EVERY 6 HOURS AS NEEDED 120 tablet 0  . traMADol (ULTRAM) 50 MG tablet TAKE ONE TABLET BY MOUTH EVERY 6 HOURS AS NEEDED 120 tablet 0   No facility-administered medications prior to visit.    ROS Review of Systems  Constitutional: Negative for diaphoresis, fatigue and unexpected weight change.  HENT: Negative.   Eyes: Negative.   Respiratory: Negative for cough, chest tightness, shortness of breath and wheezing.   Cardiovascular: Negative for chest pain, palpitations and leg swelling.  Gastrointestinal: Negative for abdominal pain, constipation and diarrhea.  Endocrine: Negative.   Genitourinary: Negative.  Negative for difficulty urinating.  Musculoskeletal: Positive for arthralgias and back pain. Negative for myalgias.  Skin: Negative.   Neurological: Negative.  Negative for dizziness, weakness, light-headedness and headaches.  Hematological: Negative for adenopathy. Does not bruise/bleed easily.  Psychiatric/Behavioral: Positive for sleep disturbance. Negative for confusion, decreased concentration, dysphoric mood and suicidal ideas. The patient is nervous/anxious.  Objective:  BP 130/76 (BP Location: Left Arm, Patient Position: Sitting, Cuff Size: Large)   Pulse 77   Temp 98.1 F (36.7 C) (Oral)   Resp 16   Ht 5' (1.524 m)   Wt 116 lb (52.6 kg)   SpO2 97%   BMI 22.65 kg/m   BP Readings from Last 3 Encounters:  04/17/21 130/76  04/10/21 (!) 148/90  03/13/21 (!) 142/90    Wt Readings from Last 3 Encounters:  04/17/21 116 lb (52.6 kg)  04/10/21 116 lb (52.6 kg)  03/13/21 116 lb (52.6  kg)    Physical Exam Vitals reviewed.  HENT:     Nose: Nose normal.     Mouth/Throat:     Mouth: Mucous membranes are moist.  Eyes:     General: No scleral icterus.    Conjunctiva/sclera: Conjunctivae normal.  Cardiovascular:     Rate and Rhythm: Normal rate and regular rhythm.     Heart sounds: Murmur heard.   Systolic murmur is present with a grade of 1/6. No gallop.      Comments: + click Pulmonary:     Effort: Pulmonary effort is normal.     Breath sounds: No stridor. No wheezing, rhonchi or rales.  Abdominal:     General: Abdomen is flat.     Palpations: There is no mass.     Tenderness: There is no abdominal tenderness. There is no guarding.  Musculoskeletal:     Cervical back: Neck supple.     Right lower leg: No edema.     Left lower leg: No edema.  Lymphadenopathy:     Cervical: No cervical adenopathy.  Skin:    General: Skin is warm and dry.     Coloration: Skin is not pale.  Neurological:     General: No focal deficit present.     Mental Status: She is alert.  Psychiatric:        Mood and Affect: Mood normal.        Behavior: Behavior normal.        Thought Content: Thought content normal.        Judgment: Judgment normal.     Lab Results  Component Value Date   WBC 5.2 01/02/2021   HGB 14.1 01/02/2021   HCT 42.2 01/02/2021   PLT 151.0 01/02/2021   GLUCOSE 86 01/02/2021   CHOL 277 (H) 05/23/2020   TRIG 82.0 05/23/2020   HDL 60.90 05/23/2020   LDLDIRECT 171.0 01/27/2013   LDLCALC 199 (H) 05/23/2020   ALT 13 01/02/2021   AST 20 01/02/2021   NA 138 01/02/2021   K 4.6 01/02/2021   CL 105 01/02/2021   CREATININE 0.75 01/02/2021   BUN 11 01/02/2021   CO2 27 01/02/2021   TSH 0.80 01/02/2021   INR 2.7 04/17/2021   HGBA1C 5.3 12/22/2018    CT Head Wo Contrast  Result Date: 07/24/2020 CLINICAL DATA:  Tripped while walking striking top of dresser with bridge of nose. No loss of consciousness. Vertigo. On anticoagulation. EXAM: CT HEAD WITHOUT  CONTRAST TECHNIQUE: Contiguous axial images were obtained from the base of the skull through the vertex without intravenous contrast. COMPARISON:  Head CT 04/07/2020.  Brain MRI 04/19/2020 FINDINGS: Brain: No intracranial hemorrhage, mass effect, or midline shift. No hydrocephalus. The basilar cisterns are patent. No evidence of territorial infarct or acute ischemia. No CT correlate to the site of microhemorrhages on MRI. No extra-axial or intracranial fluid collection. Vascular: No hyperdense vessel or unexpected calcification. Skull: No  fracture or focal lesion. Sinuses/Orbits: Assessed on concurrent face CT, reported separately. Other: Previous right frontal scalp hematoma has resolved. IMPRESSION: No acute intracranial abnormality. No skull fracture. Electronically Signed   By: Keith Rake M.D.   On: 07/24/2020 22:10   CT Maxillofacial Wo Contrast  Result Date: 07/24/2020 CLINICAL DATA:  Tripped while walking striking dresser with nose. EXAM: CT MAXILLOFACIAL WITHOUT CONTRAST TECHNIQUE: Multidetector CT imaging of the maxillofacial structures was performed. Multiplanar CT image reconstructions were also generated. COMPARISON:  Face CT 04/07/2020 FINDINGS: Osseous: Peri nasal edema and soft tissue air but no evidence of nasal bone fracture. Trace leftward nasal septal deviation. No fracture of the zygomatic arches, mandibles, or pterygoid plates. Mild streak artifact from dental hardware. Temporomandibular joints are congruent. Orbits: No orbital fracture.  No globe injury. Sinuses: No sinus fracture or fluid level. Included mastoid air cells are clear. Soft tissues: Perinasal edema and soft tissue air. Otherwise negative. Limited intracranial: Assessed on concurrent head CT, reported separately. IMPRESSION: Perinasal edema and soft tissue air consistent with laceration. No nasal bone or facial fracture. Electronically Signed   By: Keith Rake M.D.   On: 07/24/2020 22:14    Assessment & Plan:    Sinclaire was seen today for hypertension.  Diagnoses and all orders for this visit:  Essential hypertension, benign- Her BP is well controlled.  Anxiety state -     lamoTRIgine (LAMICTAL) 200 MG tablet; Take 2 tablets (400 mg total) by mouth daily.  Bipolar disorder, in partial remission, most recent episode mixed ([place])- She is doing well at this dose. Will continue. -     lamoTRIgine (LAMICTAL) 200 MG tablet; Take 2 tablets (400 mg total) by mouth daily.   I have changed Adrieanna L. Kizer's lamoTRIgine. I am also having her maintain her lansoprazole, albuterol, ondansetron, metoprolol tartrate, meclizine, pravastatin, gabapentin, warfarin, traMADol, and DULoxetine.  Meds ordered this encounter  Medications  . lamoTRIgine (LAMICTAL) 200 MG tablet    Sig: Take 2 tablets (400 mg total) by mouth daily.    Dispense:  180 tablet    Refill:  1     Follow-up: Return in about 6 months (around 10/18/2021).  [name], MD

## 2021-04-17 NOTE — Patient Instructions (Signed)
Description   Continue on the same dose of warfarin 1 tablet daily except for 1.5 tablets on Sundays and Thursdays. Recheck INR in 4 weeks. [facility] [phone].

## 2021-04-21 ENCOUNTER — Other Ambulatory Visit: Payer: Self-pay | Admitting: Family Medicine

## 2021-04-25 ENCOUNTER — Other Ambulatory Visit: Payer: Self-pay | Admitting: Cardiology

## 2021-04-29 ENCOUNTER — Other Ambulatory Visit: Payer: Self-pay | Admitting: Family Medicine

## 2021-05-08 ENCOUNTER — Ambulatory Visit
Admission: RE | Admit: 2021-05-08 | Discharge: 2021-05-08 | Disposition: A | Discharge status: Home / Self Care | Payer: Managed Care, Other (non HMO) | Source: Ambulatory Visit | Attending: Sports Medicine | Admitting: Sports Medicine

## 2021-05-08 ENCOUNTER — Other Ambulatory Visit: Payer: Self-pay

## 2021-05-08 ENCOUNTER — Ambulatory Visit (INDEPENDENT_AMBULATORY_CARE_PROVIDER_SITE_OTHER): Payer: Managed Care, Other (non HMO) | Admitting: Sports Medicine

## 2021-05-08 VITALS — BP 132/80 | Ht 60.0 in | Wt 116.0 lb

## 2021-05-08 DIAGNOSIS — M25562 Pain in left knee: Secondary | ICD-10-CM

## 2021-05-08 DIAGNOSIS — G8929 Other chronic pain: Secondary | ICD-10-CM

## 2021-05-08 NOTE — Progress Notes (Addendum)
Patient ID: Danielle Obrien, female   DOB: 01/18/1981, 40 y.o.   MRN: [id number]  Patient comes in today for follow-up on left knee pain.  She has yet to get x-rays of her left knee or her lumbar spine.  She thought that [facility] was going to contact her but I explained to her that it is a simple walk-in and she can get those x-rays today if she would like.  Her left knee is still rather symptomatic.  She still thinks the heel lift is useful and does not want to discontinue it.  Her pain is severe enough that she is in tears today.  Physical exam of the knee shows no obvious effusion.  It is nonerythematous.  Good range of motion.    I offered her a cortisone injection but she would like for me to evaluate the x-ray first.  She will proceed from our office to [place] and get those x-rays done today.  I will then call her tomorrow with those results.  We can reconsider injection at that time.  Although she did not find the compression sleeve to be comfortable she would like to try a double upright brace.  She has had success with this type of brace in the past.

## 2021-05-14 ENCOUNTER — Telehealth: Payer: Self-pay | Admitting: Sports Medicine

## 2021-05-14 NOTE — Telephone Encounter (Signed)
I spoke with Danielle Obrien on the phone today after reviewing x-rays of her left knee and lumbar spine.  Left knee x-rays are fairly unremarkable.  No significant degenerative changes.  She is status post ACL repair remotely.  X-rays of her lumbar spine show multilevel degenerative disc disease as well as multilevel facet arthropathy.  Nothing acute.  Her main pain continues to be in the left knee where she also endorses some stiffness and swelling.  I have recommended that we try cortisone injection to see if we can settle things down.  If that is ineffective then we may need to consider merits of further diagnostic imaging.

## 2021-05-15 ENCOUNTER — Ambulatory Visit (INDEPENDENT_AMBULATORY_CARE_PROVIDER_SITE_OTHER): Payer: Managed Care, Other (non HMO) | Admitting: Pharmacist

## 2021-05-15 ENCOUNTER — Other Ambulatory Visit: Payer: Self-pay

## 2021-05-15 DIAGNOSIS — Q231 Congenital insufficiency of aortic valve: Secondary | ICD-10-CM | POA: Diagnosis not present

## 2021-05-15 DIAGNOSIS — Q230 Congenital stenosis of aortic valve: Secondary | ICD-10-CM | POA: Diagnosis not present

## 2021-05-15 DIAGNOSIS — Z954 Presence of other heart-valve replacement: Secondary | ICD-10-CM

## 2021-05-15 DIAGNOSIS — Z7901 Long term (current) use of anticoagulants: Secondary | ICD-10-CM

## 2021-05-15 LAB — POCT INR: INR: 2.7 (ref 2.0–3.0)

## 2021-05-15 NOTE — Patient Instructions (Signed)
Continue on the same dose of warfarin 1 tablet daily except for 1.5 tablets on Sundays and Thursdays. Recheck INR in 5 weeks. [facility] [phone].

## 2021-05-22 ENCOUNTER — Other Ambulatory Visit: Payer: Self-pay | Admitting: Cardiology

## 2021-05-24 ENCOUNTER — Other Ambulatory Visit: Payer: Self-pay | Admitting: Family Medicine

## 2021-06-04 ENCOUNTER — Other Ambulatory Visit: Payer: Self-pay | Admitting: Cardiology

## 2021-06-19 ENCOUNTER — Other Ambulatory Visit: Payer: Self-pay

## 2021-06-19 ENCOUNTER — Ambulatory Visit (INDEPENDENT_AMBULATORY_CARE_PROVIDER_SITE_OTHER): Payer: Managed Care, Other (non HMO)

## 2021-06-19 ENCOUNTER — Telehealth: Payer: Self-pay | Admitting: Cardiology

## 2021-06-19 DIAGNOSIS — Q231 Congenital insufficiency of aortic valve: Secondary | ICD-10-CM | POA: Diagnosis not present

## 2021-06-19 DIAGNOSIS — Q230 Congenital stenosis of aortic valve: Secondary | ICD-10-CM

## 2021-06-19 DIAGNOSIS — Z954 Presence of other heart-valve replacement: Secondary | ICD-10-CM

## 2021-06-19 DIAGNOSIS — Z7901 Long term (current) use of anticoagulants: Secondary | ICD-10-CM | POA: Diagnosis not present

## 2021-06-19 LAB — POCT INR: INR: 2.7 (ref 2.0–3.0)

## 2021-06-19 NOTE — Patient Instructions (Signed)
Description   Continue on the same dose of warfarin 1 tablet daily except for 1.5 tablets on Sundays and Thursdays. Recheck INR in 6 weeks. [facility] [phone].

## 2021-06-19 NOTE — Telephone Encounter (Signed)
Patient states she had heart surgery and the plate that Dr. Ricard Dillon put in is swollen. She says the swelling is all across her chest and it feels like she just got out of surgery. She says it has been swelling for about and she thought it would go down, but it has spread. She says it feels like it's being ripped apart, but has no other symptoms.

## 2021-06-19 NOTE — Telephone Encounter (Signed)
Spoke with the patient who states that when she had her aortic valve replacement she had to have a plate put in on the right side of her chest. She has been having swelling in that area along with pain. She said swelling has spread across her chest. States that pain feels like it did after she had her surgery. She denies any other symptoms. She does not have a fever. She states that she called Dr. Guy Sandifer office but since he has left they needed a new referral for her to be seen. Patient advised that she should report to the ER for evaluation and that I will let Dr. Radford Pax know so that we can refer her back to [facility]. Patient verbalized understanding.

## 2021-06-20 NOTE — Telephone Encounter (Signed)
Spoke with [facility] and they have informed Dr. Roxan Hockey about the patient's concerns and are waiting to hear back.

## 2021-06-23 ENCOUNTER — Other Ambulatory Visit: Payer: Self-pay | Admitting: Internal Medicine

## 2021-06-23 DIAGNOSIS — E785 Hyperlipidemia, unspecified: Secondary | ICD-10-CM

## 2021-06-24 ENCOUNTER — Other Ambulatory Visit: Payer: Self-pay | Admitting: Thoracic Surgery (Cardiothoracic Vascular Surgery)

## 2021-06-24 DIAGNOSIS — Z952 Presence of prosthetic heart valve: Secondary | ICD-10-CM

## 2021-06-26 ENCOUNTER — Institutional Professional Consult (permissible substitution): Payer: Managed Care, Other (non HMO) | Admitting: Thoracic Surgery (Cardiothoracic Vascular Surgery)

## 2021-06-26 ENCOUNTER — Other Ambulatory Visit: Payer: Self-pay

## 2021-06-26 ENCOUNTER — Other Ambulatory Visit: Payer: Self-pay | Admitting: Family Medicine

## 2021-06-26 ENCOUNTER — Ambulatory Visit
Admission: RE | Admit: 2021-06-26 | Discharge: 2021-06-26 | Disposition: A | Discharge status: Home / Self Care | Payer: Managed Care, Other (non HMO) | Source: Ambulatory Visit | Attending: Thoracic Surgery (Cardiothoracic Vascular Surgery) | Admitting: Thoracic Surgery (Cardiothoracic Vascular Surgery)

## 2021-06-26 VITALS — BP 148/88 | HR 70 | Resp 20 | Ht 60.0 in | Wt 119.0 lb

## 2021-06-26 DIAGNOSIS — R222 Localized swelling, mass and lump, trunk: Secondary | ICD-10-CM | POA: Diagnosis not present

## 2021-06-26 DIAGNOSIS — Z952 Presence of prosthetic heart valve: Secondary | ICD-10-CM | POA: Diagnosis not present

## 2021-06-26 NOTE — Progress Notes (Signed)
PCP is Janith Lima, MD Referring Provider is Janith Lima, MD  Chief Complaint  Patient presents with   lump in chest     HX of mini AVR  10/2017, c/o of lump in chest area, CXR today    HPI: Danielle Obrien returns for evaluation of a lump in the area of her previous surgery.  Danielle Obrien is a 40 year old woman with a past medical history significant for bicuspid aortic valve, aortic stenosis, mechanical AVR, hypertension, hyperlipidemia, arthritis, asthma, ADD, bipolar disorder.  Dr. Roxy Manns did a right minithoracotomy for aortic valve replacement with a 23 mm [employer] top hat mechanical valve in December 2018.  She has been on Coumadin since then.  He saw her back in about 6 months postop with a complaint of a lump in the area.  No further intervention was undertaken.  More recently she has noted a more prominent lump and some increased pain particularly along the sternal edge of the incision.   Past Medical History:  Diagnosis Date   Abnormal Pap smear of cervix    --recurrent ascus w/Pos. 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   23 mm Sorin [employer] Top Hat bileaflet mechanical valve via right anterior mini thoracotomy   Tobacco abuse 09/27/2015   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   biopsy and CO2 laser ablation    Past Surgical History:  Procedure Laterality Date   ABDOMINAL HYSTERECTOMY  02/06/2009   Robotic total laparoscopic hysterectomy   ANTERIOR CRUCIATE LIGAMENT REPAIR  1993   AORTIC VALVE REPLACEMENT  N/A 11/03/2017   Procedure: MINIMALLY INVASIVE AORTIC VALVE REPLACEMENT( MINI THORACOTOMY);  Surgeon: Rexene Alberts, MD;  Location: [facility];  Service: Open Heart Surgery;  Laterality: N/A;   CARDIAC CATHETERIZATION  June 2015   no CAD - moderate AS noted   CERVICAL BIOPSY  W/ LOOP ELECTRODE EXCISION  11/2008   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 ECC   COLPOSCOPY  06/2001   CIN I   COLPOSCOPY  08/2004   ECC--atypia   COLPOSCOPY N/A 01/21/2016   Procedure: COLPOSCOPY with vaginal biopsy with CO 2 Laser of Vaginal and vulvar condyloma;  Surgeon: [name], MD;  Location: [facility];  Service: Gynecology;  Laterality: N/A;  [name] will be here 2/21 for 1115 case confirmed 01/16/15 - [facility]   HERNIA REPAIR  1981/1982   HIP SURGERY  1981   Hip Reset    HIP SURGERY  1990   Plate was reconstructed/ took out growth plate in Right knee   HIP SURGERY  1992   Plate removed in Left hip   LEFT AND RIGHT HEART CATHETERIZATION WITH CORONARY ANGIOGRAM N/A 05/18/2014   Procedure: LEFT AND RIGHT HEART CATHETERIZATION WITH CORONARY ANGIOGRAM;  Surgeon: Jettie Booze, MD;  Location: [facility] C[facility];  Service: Cardiovascular;  Laterality: N/A;   LYMPH NODE BIOPSY  1995   NASAL SEPTUM SURGERY  2002   TEE WITHOUT CARDIOVERSION N/A 11/03/2017   Procedure: TRANSESOPHAGEAL ECHOCARDIOGRAM (TEE);  Surgeon: Rexene Alberts, MD;  Location: [facility];  Service: Open Heart Surgery;  Laterality: N/A;   TONSILLECTOMY AND ADENOIDECTOMY  1990   TYMPANOSTOMY TUBE PLACEMENT  1981/1982    Family History  Problem Relation Age of Onset   Hyperlipidemia Mother    Hypertension Mother    Thyroid disease Mother    Hyperlipidemia Father    Hypertension Father    Migraines Father    Hypertension Brother    Hyperlipidemia Brother    Thyroid disease Maternal Grandmother    Thyroid disease Maternal Grandfather    Cancer Neg Hx     Social History Social History    Tobacco Use   Smoking status: Light Smoker    Packs/day: 0.50    Years: 12.00    Pack years: 6.00    Types: Cigarettes    Last attempt to quit: 11/02/2017    Years since quitting: 3.6   Smokeless tobacco: Never   Tobacco comments:    pt given fake cigarette for hand/mouth association. pt using [facility] for support  Vaping Use   Vaping Use: Never used  Substance Use Topics   Alcohol use: No    Alcohol/week: 0.0 standard drinks   Drug use: No    Current Outpatient Medications  Medication Sig Dispense Refill   albuterol (PROVENTIL HFA;VENTOLIN HFA) 108 (90 Base) MCG/ACT inhaler Inhale 2 puffs into the lungs every 6 (six) hours as needed for wheezing or shortness of breath. 1 Inhaler 3   DULoxetine (CYMBALTA) 30 MG capsule TAKE ONE CAPSULE BY MOUTH DAILY 30 capsule 3   gabapentin (NEURONTIN) 300 MG capsule TAKE TWO CAPSULES BY MOUTH THREE TIMES A DAY 540 capsule 0   lamoTRIgine (LAMICTAL) 200 MG tablet Take 2 tablets (400 mg total) by mouth daily. 180 tablet 1   lansoprazole (PREVACID) 15 MG capsule Take 15 mg by mouth daily as needed (for heartburn or acid reflux).      meclizine (ANTIVERT) 12.5 MG tablet TAKE ONE TABLET BY MOUTH TWICE A DAY AS NEEDED FOR DIZZINESS 20 tablet 0   metoprolol tartrate (LOPRESSOR) 25 MG tablet Take 0.5 tablets (12.5 mg total) by mouth 2 (two) times daily. Pt needs to make appt with provider for more refills - 2nd attempt 15 tablet 0   ondansetron (ZOFRAN) 4 MG tablet Take 1 tablet (4 mg total) by mouth every 8 (eight) hours as needed for nausea or vomiting. 20 tablet 0   pravastatin (PRAVACHOL) 20 MG tablet TAKE ONE TABLET BY MOUTH DAILY 90 tablet 1   traMADol (ULTRAM) 50 MG tablet TAKE TWO TABLETS BY MOUTH TWICE A DAY 120 tablet 0   warfarin (COUMADIN) 7.5 MG tablet TAKE AS DIRECTED BY COUMADIN CLINIC FOR 30 DAY SUPPLY 40 tablet 3   No current facility-administered medications for this visit.    Allergies  Allergen Reactions   Demerol  Nausea And Vomiting   Ibuprofen Other (See Comments)    Gastritis    Codeine Itching    Review of Systems  Constitutional:  Positive for appetite change. Negative for activity change.  HENT:  Positive for trouble swallowing.   Respiratory:  Negative for chest tightness and shortness of breath.   Cardiovascular:  Negative for chest pain and leg swelling.       Chest wall pain  Musculoskeletal:  Positive for arthralgias and myalgias.  Neurological:  Positive for dizziness and headaches. Negative for weakness.  Hematological:  Bruises/bleeds easily (On Coumadin).  Psychiatric/Behavioral:  Positive for dysphoric mood. The patient is nervous/anxious.   All other systems reviewed and are negative.  BP (!) 148/88   Pulse 70   Resp 20   Ht 5' (1.524 m)   Wt 119 lb (54 kg)   SpO2 96%   BMI 23.24 kg/m  Physical Exam Vitals reviewed.  Constitutional:      General: She is not in acute distress.    Appearance: Normal appearance.  HENT:     Head: Normocephalic and atraumatic.  Eyes:     General: No scleral icterus.    Extraocular Movements: Extraocular movements intact.  Cardiovascular:     Rate and Rhythm: Normal rate and regular rhythm.     Heart sounds: Murmur (Clear valve click) heard.  Pulmonary:     Effort: Pulmonary effort is normal. No respiratory distress.     Breath sounds: Normal breath sounds. No wheezing.  Abdominal:     General: There is no distension.     Palpations: Abdomen is soft.  Musculoskeletal:     Comments: Right chest wall incision well-healed.  Tenderness to palpation over hardware on third costal cartilage.  Small lung herniation with coughing.  Skin:    General: Skin is warm and dry.  Neurological:     General: No focal deficit present.     Mental Status: She is alert and oriented to person, place, and time.     Diagnostic Tests: CHEST - 2 VIEW   COMPARISON:  04/25/2020   FINDINGS: Postoperative changes of aortic valve replacement. Heart is  normal size. Lungs clear. No effusions or pneumothorax. No acute bony abnormality.   IMPRESSION: No active cardiopulmonary disease.     Electronically Signed   By: Rolm Baptise M.D.   On: 06/26/2021 15:31 I personally reviewed the chest x-ray and compared to a film from shortly after her surgery in December 2018.  Laterally a couple of screws have backed out of the plate and it looks like the plate may be less attached to the costal cartilage in that area.  Impression: Danielle Obrien is a 40 year old woman with a past medical history significant for bicuspid aortic valve, aortic stenosis, mechanical AVR, hypertension, hyperlipidemia, arthritis, asthma, ADD, bipolar disorder.  She had a mechanical AVR via a right minithoracotomy in 2018.  She now has pain and a lump at her incision.  Reviewing her chest x-ray it appears of the lateral screws have backed out of the plate and the plate may have come away from the costal cartilage.  It is hard to tell for sure from a plain film but there is no question the screws have backed out.  She has some tenderness in that area but no erythema or induration speak of there is no evidence of infection.  She does have some lung herniation with cough.  We discussed her the option of hardware removal and reconstructing that area with some type of prosthetic such as cortex or Marlex mesh.  She understands there is no particular danger to having that plate detach or the lung hernia just comes down to how much discomfort she is having.  It is a difficult area adjacent to the sternum and the procedure would involve drilling the ribs.  Of course she would need to be off of Coumadin and transitioned with either heparin or Lovenox prior to  surgery.  The decision to proceed with surgery as strictly hers unless she starts to get evidence of the plate causing pressure on the skin.  If that were to happen she should definitely proceed with surgery to prevent possibility of  infection.  Currently she has discomfort but it is not severe enough that she wants to go through an operation.  She is relieved to know that there are no major issues associated with her current situation.  Plan: She will call to further discuss surgery if her pain worsens and she is not able to engage in her normal activities.  I spent 20 minutes in review of records, images, and in consultation with Danielle Obrien today Melrose Nakayama, MD [facility] [phone]

## 2021-06-27 ENCOUNTER — Other Ambulatory Visit: Payer: Self-pay | Admitting: Cardiology

## 2021-06-27 NOTE — Telephone Encounter (Signed)
Prescription refill request received for warfarin Lov: 12/14/19 Radford Pax) Next INR check: 07/31/21 Warfarin tablet strength: 7.'5mg'$ 

## 2021-07-17 ENCOUNTER — Other Ambulatory Visit: Payer: Self-pay | Admitting: Family Medicine

## 2021-07-30 ENCOUNTER — Other Ambulatory Visit: Payer: Self-pay | Admitting: Family Medicine

## 2021-07-31 ENCOUNTER — Other Ambulatory Visit: Payer: Self-pay

## 2021-07-31 ENCOUNTER — Ambulatory Visit (INDEPENDENT_AMBULATORY_CARE_PROVIDER_SITE_OTHER): Payer: Managed Care, Other (non HMO) | Admitting: *Deleted

## 2021-07-31 DIAGNOSIS — Z7901 Long term (current) use of anticoagulants: Secondary | ICD-10-CM

## 2021-07-31 DIAGNOSIS — Z954 Presence of other heart-valve replacement: Secondary | ICD-10-CM

## 2021-07-31 DIAGNOSIS — Z5181 Encounter for therapeutic drug level monitoring: Secondary | ICD-10-CM | POA: Diagnosis not present

## 2021-07-31 LAB — POCT INR: INR: 2.1 (ref 2.0–3.0)

## 2021-07-31 NOTE — Patient Instructions (Signed)
Description   Continue on the same dose of warfarin 1 tablet daily except for 1.5 tablets on Sundays and Thursdays. Recheck INR in 6 weeks. [facility] [phone].

## 2021-08-20 ENCOUNTER — Other Ambulatory Visit: Payer: Self-pay | Admitting: Family Medicine

## 2021-09-02 ENCOUNTER — Other Ambulatory Visit: Payer: Self-pay | Admitting: Family Medicine

## 2021-09-05 NOTE — Progress Notes (Signed)
Danielle Obrien [place] [address] [place] Danielle Obrien Phone: [phone] Subjective:   Danielle Obrien, am serving as a scribe for Dr. Hulan Saas. This visit occurred during the SARS-CoV-2 public health emergency.  Safety protocols were in place, including screening questions prior to the visit, additional usage of staff PPE, and extensive cleaning of exam room while observing appropriate contact time as indicated for disinfecting solutions.   I'm seeing this patient by the request  of:  Danielle Lima, MD  CC: Knee pain, follow-up chronic pain  [id number]  Danielle Obrien is a 40 y.o. female coming in with complaint of L knee pain. Last seen in April 2022 for cervical spine pain. Saw Dr. Micheline Obrien for L knee pain. Leg length discrepancy noted and heel lift recommended for contralateral shoe. Patient states left knee has been hurting since she began wearing lifts from [place]. Knee today is visibly swollen on the medial side. Point of pain is near lateral side of meniscus.  Lumbar 05/08/2021 Xray IMPRESSION: Degenerative changes as above. Mild calcified atherosclerosis in the abdominal aorta.    L knee 05/08/2021 Xray IMPRESSION: No cause for the patient's symptoms identified.       Past Medical History:  Diagnosis Date   Abnormal Pap smear of cervix    --recurrent ascus w/Pos. 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   23 mm Sorin [employer] Top [place] bileaflet mechanical valve via right anterior mini thoracotomy    Tobacco abuse 09/27/2015   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   biopsy and CO2 laser ablation   Past Surgical History:  Procedure Laterality Date   ABDOMINAL HYSTERECTOMY  02/06/2009   Robotic total laparoscopic hysterectomy   ANTERIOR CRUCIATE LIGAMENT REPAIR  1993   AORTIC VALVE REPLACEMENT N/A 11/03/2017   Procedure: MINIMALLY INVASIVE AORTIC VALVE REPLACEMENT( MINI THORACOTOMY);  Surgeon: Danielle Alberts, MD;  Location: [place];  Service: Open Heart Surgery;  Laterality: N/A;   CARDIAC CATHETERIZATION  June 2015   no CAD - moderate AS noted   CERVICAL BIOPSY  W/ LOOP ELECTRODE EXCISION  11/2008   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 ECC   COLPOSCOPY  06/2001   CIN I   COLPOSCOPY  08/2004   ECC--atypia   COLPOSCOPY N/A 01/21/2016   Procedure: COLPOSCOPY with vaginal biopsy with CO 2 Laser of Vaginal and vulvar condyloma;  Surgeon: Danielle Cobbs, MD;  Location: [facility];  Service: Gynecology;  Laterality: N/A;  Danielle Obrien will be here 2/21 for 1115 case confirmed 01/16/15 - TS   HERNIA REPAIR  1981/1982   HIP SURGERY  1981   Hip Reset    HIP SURGERY  1990   Plate was reconstructed/ took out growth plate in Right knee   [place]  Plate removed in Left hip   LEFT AND RIGHT HEART CATHETERIZATION WITH CORONARY ANGIOGRAM N/A 05/18/2014   Procedure: LEFT AND RIGHT HEART CATHETERIZATION WITH CORONARY ANGIOGRAM;  Surgeon: Danielle Booze, MD;  Location: [facility] C[facility];  Service: Cardiovascular;  Laterality: N/A;   LYMPH NODE BIOPSY  1995   NASAL SEPTUM SURGERY  2002   TEE WITHOUT CARDIOVERSION N/A 11/03/2017   Procedure: TRANSESOPHAGEAL ECHOCARDIOGRAM (TEE);  Surgeon: Danielle Alberts, MD;  Location: [place];  Service: Open Heart Surgery;  Laterality: N/A;   TONSILLECTOMY AND ADENOIDECTOMY  1990   TYMPANOSTOMY TUBE PLACEMENT  1981/1982   Social History   Socioeconomic History   Marital status:  Single    Spouse name: Not on file   Number of children: Not on file   Years of education: Not on file   Highest education level: Not on file  Occupational History   Not on file  Tobacco Use   Smoking status: Light Smoker    Packs/day: 0.50    Years: 12.00    Pack years: 6.00    Types: Cigarettes    Last attempt to quit: 11/02/2017    Years since quitting: 3.8   Smokeless tobacco: Never   Tobacco comments:    pt given fake cigarette for hand/mouth association. pt using [employer] for support  Vaping Use   Vaping Use: Never used  Substance and Sexual Activity   Alcohol use: No    Alcohol/week: 0.0 standard drinks   Drug use: No   Sexual activity: Yes    Partners: Male    Birth control/protection: Surgical    Comment: Hysterectomy  Other Topics Concern   Not on file  Social History Narrative   Not on file   Social Determinants of Health   Financial Resource Strain: Not on file  Food Insecurity: Not on file  Transportation Needs: Not on file  Physical Activity: Not on file  Stress: Not on file  Social Connections: Not on file   Allergies  Allergen Reactions   Demerol Nausea And Vomiting   Ibuprofen Other (See Comments)    Gastritis    Codeine Itching   Family History  Problem Relation Age of Onset   Hyperlipidemia Mother    Hypertension Mother    Thyroid disease Mother    Hyperlipidemia Father    Hypertension Father    Migraines Father    Hypertension Brother    Hyperlipidemia Brother    Thyroid disease Maternal Grandmother    Thyroid disease Maternal Grandfather    Cancer Neg Hx      Current Outpatient Medications (Cardiovascular):    metoprolol tartrate (LOPRESSOR) 25 MG tablet, Take 0.5 tablets (12.5 mg total) by mouth 2 (two) times daily. Pt needs to make appt with provider for more refills - 2nd attempt   pravastatin (PRAVACHOL) 20 MG tablet, TAKE ONE TABLET BY MOUTH DAILY  Current Outpatient Medications (Respiratory):    albuterol  (PROVENTIL HFA;VENTOLIN HFA) 108 (90 Base) MCG/ACT inhaler, Inhale 2 puffs into the lungs every 6 (six) hours as needed for wheezing or shortness of breath.  Current Outpatient Medications (Analgesics):    traMADol (ULTRAM) 50 MG tablet, TAKE TWO TABLETS BY MOUTH TWICE A DAY  Current Outpatient Medications (Hematological):    warfarin (COUMADIN) 7.5 MG tablet, TAKE AS DIRECTED BY COUMADIN CLINIC FOR 30 DAYS  Current Outpatient Medications (Other):    DULoxetine (CYMBALTA) 30 MG capsule, TAKE ONE CAPSULE BY MOUTH DAILY   gabapentin (NEURONTIN) 300 MG capsule,  TAKE TWO CAPSULES BY MOUTH THREE TIMES A DAY   lamoTRIgine (LAMICTAL) 200 MG tablet, Take 2 tablets (400 mg total) by mouth daily.   lansoprazole (PREVACID) 15 MG capsule, Take 15 mg by mouth daily as needed (for heartburn or acid reflux).    meclizine (ANTIVERT) 12.5 MG tablet, TAKE ONE TABLET BY MOUTH TWICE A DAY AS NEEDED FOR DIZZINESS   ondansetron (ZOFRAN) 4 MG tablet, Take 1 tablet (4 mg total) by mouth every 8 (eight) hours as needed for nausea or vomiting.   Reviewed prior external information including notes and imaging from  primary care provider As well as notes that were available from care everywhere and other healthcare systems.  Past medical history, social, surgical and family history all reviewed in electronic medical record.  No pertanent information unless stated regarding to the chief complaint.   Review of Systems:  No headache, visual changes, nausea, vomiting, diarrhea, constipation, dizziness, abdominal pain, skin rash, fevers, chills, night sweats, weight loss, swollen lymph nodes, body aches, joint swelling, chest pain, shortness of breath, mood changes. POSITIVE muscle aches  Objective  Blood pressure 130/80, pulse 86, height 5' (1.524 m), weight 115 lb (52.2 kg), SpO2 98 %.   General: No apparent distress alert and oriented x3 mood and affect normal, dressed appropriately.  HEENT: Pupils equal,  extraocular movements intact  Respiratory: Patient's speak in full sentences and does not appear short of breath  Cardiovascular: No lower extremity edema, non tender, no erythema  Gait normal with good balance and coordination.  MSK: Antalgic gait noted.  Patient still has leg length discrepancy.  Tenderness to palpation in the paraspinal musculature of the lumbar spine as well as discomfort with range of motion of the hip on the left side.  Patient's left knee does have effusion noted.  Crepitus noted.  Lateral tracking.  Positive McMurray's.  Good stability with valgus and varus force  Limited muscular skeletal ultrasound was performed and interpreted by Hulan Saas, M  Limited ultrasound shows the patient does have a effusion noted at the patellofemoral joint.  Narrowing with moderate arthritic changes noted as well of this joint.  Patient does have what appears to be a meniscal injury with a acute on chronic potential tear with mild displacement.  Patient also has what appears to be a parameniscal cyst noted of this lateral meniscus.  Procedure: Real-time Ultrasound Guided Injection of left knee and attempted aspiration of the parameniscal cyst Device: GE Logiq Q7 Ultrasound guided injection is preferred based studies that show increased duration, increased effect, greater accuracy, decreased procedural pain, increased response rate, and decreased cost with ultrasound guided versus blind injection.  Verbal informed consent obtained.  Time-out conducted.  Noted no overlying erythema, induration, or other signs of local infection.  Skin prepped in a sterile fashion.  Local anesthesia: Topical Ethyl chloride.  With sterile technique and under real time ultrasound guidance: With a 21-gauge 2 inch needle injected with 2 cc of 0.5% Marcaine and difficulty with aspiration likely secondary to viscosity of material but then injected 1 cc of Kenalog 40 mg mL into the intra-articular area. Pain  immediately improved suggesting accurate placement of the medication.  Advised to call if fevers/chills, erythema, induration, drainage, or persistent bleeding.  Impression: Technically successful ultrasound guided injection.    Impression and Recommendations:     The above documentation has been reviewed and is accurate and complete Lyndal Pulley, DO

## 2021-09-08 ENCOUNTER — Ambulatory Visit: Payer: Managed Care, Other (non HMO) | Admitting: Family Medicine

## 2021-09-08 ENCOUNTER — Ambulatory Visit: Payer: Self-pay

## 2021-09-08 ENCOUNTER — Other Ambulatory Visit: Payer: Self-pay

## 2021-09-08 VITALS — BP 130/80 | HR 86 | Ht 60.0 in | Wt 115.0 lb

## 2021-09-08 DIAGNOSIS — S83282A Other tear of lateral meniscus, current injury, left knee, initial encounter: Secondary | ICD-10-CM | POA: Diagnosis not present

## 2021-09-08 DIAGNOSIS — M25561 Pain in right knee: Secondary | ICD-10-CM

## 2021-09-08 NOTE — Patient Instructions (Addendum)
Injection today Do prescribed exercises at least 3x a week See you again in 4-5 weeks If pain returns before then message or call us to get possible MRI

## 2021-09-09 ENCOUNTER — Encounter: Payer: Self-pay | Admitting: Family Medicine

## 2021-09-09 DIAGNOSIS — S83282A Other tear of lateral meniscus, current injury, left knee, initial encounter: Secondary | ICD-10-CM | POA: Insufficient documentation

## 2021-09-09 NOTE — Assessment & Plan Note (Signed)
Patient had injection today.  Attempted aspiration of the parameniscal cyst but secondary to possible seems to be a little bit more thick and was unable to obtain.  Patient though did tolerate the procedure well and was able to ambulate better already.  Beese patient given home exercises.  Patient is on a blood thinner so we discussed the potential for bleeding.  With patient having knee pain for greater than 3 months and seeing other providers if continuing to have difficulty or any increasing instability I do feel advanced imaging is warranted.  Follow-up again in 4 to 6 weeks

## 2021-09-24 ENCOUNTER — Telehealth: Payer: Self-pay | Admitting: *Deleted

## 2021-09-24 NOTE — Telephone Encounter (Signed)
Called pt since she is overdue for INR monitoring, there was no answer so left her a message to callback to reschedule.

## 2021-09-30 ENCOUNTER — Other Ambulatory Visit: Payer: Self-pay | Admitting: Family Medicine
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
## 2021-10-06 ENCOUNTER — Ambulatory Visit: Payer: Self-pay

## 2021-10-06 ENCOUNTER — Ambulatory Visit (INDEPENDENT_AMBULATORY_CARE_PROVIDER_SITE_OTHER): Payer: Managed Care, Other (non HMO) | Admitting: Family Medicine

## 2021-10-06 ENCOUNTER — Encounter: Payer: Self-pay | Admitting: Family Medicine

## 2021-10-06 ENCOUNTER — Other Ambulatory Visit: Payer: Self-pay

## 2021-10-06 VITALS — BP 110/80 | HR 71 | Ht 60.0 in | Wt 113.0 lb

## 2021-10-06 DIAGNOSIS — M2740 Unspecified cyst of jaw: Secondary | ICD-10-CM | POA: Diagnosis not present

## 2021-10-06 DIAGNOSIS — J342 Deviated nasal septum: Secondary | ICD-10-CM | POA: Diagnosis not present

## 2021-10-06 DIAGNOSIS — F1190 Opioid use, unspecified, uncomplicated: Secondary | ICD-10-CM

## 2021-10-06 DIAGNOSIS — G8929 Other chronic pain: Secondary | ICD-10-CM | POA: Diagnosis not present

## 2021-10-06 DIAGNOSIS — M25562 Pain in left knee: Secondary | ICD-10-CM | POA: Diagnosis not present

## 2021-10-06 DIAGNOSIS — S83282A Other tear of lateral meniscus, current injury, left knee, initial encounter: Secondary | ICD-10-CM

## 2021-10-06 DIAGNOSIS — D2330 Other benign neoplasm of skin of unspecified part of face: Secondary | ICD-10-CM | POA: Diagnosis not present

## 2021-10-06 MED ORDER — TIZANIDINE HCL 4 MG PO TABS: 4.0000 mg | ORAL_TABLET | Freq: Every day | ORAL | 0 refills | Status: DC

## 2021-10-06 NOTE — Patient Instructions (Addendum)
Zanaflex 4mg  at night  Can take with gabapentin as well if needed Continue Duloxetine Referral to Plastics Referral to ENT See me again in 6-8 weeks

## 2021-10-06 NOTE — Assessment & Plan Note (Addendum)
Continue tramadol Patient would like to potentially try to get off of it in the long run.  Will add muscle relaxer at night to see if this will be more beneficial than she is getting with the gabapentin at this time.

## 2021-10-06 NOTE — Assessment & Plan Note (Signed)
Refer patient for the deviated septum to ENT after fall do think that there is a contributed measure and patient does have some limited air passage noted of the left naris.

## 2021-10-06 NOTE — Assessment & Plan Note (Signed)
Likely cyst noted on the right side of the face.  Will be refer to plastics to have evaluation and likely removal ultrasound does not show any significant abnormal blood flow.  Seems to be freely movable and in the soft tissue with no communication to the underlying musculature.

## 2021-10-06 NOTE — Assessment & Plan Note (Signed)
Patient is doing 70 to 80% better at this time.  Still had a trace effusion noted.  We did discuss the possibility of MRI which patient declined because she would decline any type of surgical intervention at this point.  We will continue the home exercises and icing regimen and see how patient responds.  Follow-up with me again in 6 weeks

## 2021-10-13 ENCOUNTER — Other Ambulatory Visit: Payer: Self-pay | Admitting: Internal Medicine

## 2021-10-13 DIAGNOSIS — F3177 Bipolar disorder, in partial remission, most recent episode mixed: Secondary | ICD-10-CM

## 2021-10-13 DIAGNOSIS — F411 Generalized anxiety disorder: Secondary | ICD-10-CM

## 2021-10-16 ENCOUNTER — Other Ambulatory Visit: Payer: Self-pay

## 2021-10-16 ENCOUNTER — Ambulatory Visit (INDEPENDENT_AMBULATORY_CARE_PROVIDER_SITE_OTHER): Payer: Managed Care, Other (non HMO) | Admitting: *Deleted

## 2021-10-16 DIAGNOSIS — Z954 Presence of other heart-valve replacement: Secondary | ICD-10-CM

## 2021-10-16 DIAGNOSIS — Z7901 Long term (current) use of anticoagulants: Secondary | ICD-10-CM

## 2021-10-16 DIAGNOSIS — Q230 Congenital stenosis of aortic valve: Secondary | ICD-10-CM | POA: Diagnosis not present

## 2021-10-16 DIAGNOSIS — Q231 Congenital insufficiency of aortic valve: Secondary | ICD-10-CM

## 2021-10-16 LAB — POCT INR: INR: 2.8 (ref 2.0–3.0)

## 2021-10-16 NOTE — Patient Instructions (Signed)
Description   Take 1 tablet on Sunday (on a prednisone taper) then continue taking warfarin 1 tablet daily except for 1.5 tablets on Sundays and Thursdays. Recheck INR on Monday (normally 6 weeks). [facility] [phone].

## 2021-10-19 ENCOUNTER — Other Ambulatory Visit: Payer: Self-pay | Admitting: Family Medicine

## 2021-10-31 ENCOUNTER — Other Ambulatory Visit: Payer: Self-pay | Admitting: Family Medicine

## 2021-11-06 ENCOUNTER — Ambulatory Visit (INDEPENDENT_AMBULATORY_CARE_PROVIDER_SITE_OTHER): Payer: Managed Care, Other (non HMO)

## 2021-11-06 ENCOUNTER — Other Ambulatory Visit: Payer: Self-pay

## 2021-11-06 DIAGNOSIS — Z954 Presence of other heart-valve replacement: Secondary | ICD-10-CM

## 2021-11-06 DIAGNOSIS — Z7901 Long term (current) use of anticoagulants: Secondary | ICD-10-CM | POA: Diagnosis not present

## 2021-11-06 DIAGNOSIS — Q231 Congenital insufficiency of aortic valve: Secondary | ICD-10-CM | POA: Diagnosis not present

## 2021-11-06 DIAGNOSIS — Z5181 Encounter for therapeutic drug level monitoring: Secondary | ICD-10-CM

## 2021-11-06 DIAGNOSIS — Q230 Congenital stenosis of aortic valve: Secondary | ICD-10-CM

## 2021-11-06 LAB — POCT INR: INR: 2.2 (ref 2.0–3.0)

## 2021-11-06 NOTE — Patient Instructions (Signed)
Description   Continue taking warfarin 1 tablet daily except for 1.5 tablets on Sundays and Thursdays. Recheck INR in 5 weeks. [facility] [phone].

## 2021-11-14 NOTE — Progress Notes (Signed)
Danielle Obrien [address] [place] Phone: [phone] Subjective:   Danielle Obrien, am serving as a scribe for Dr. Hulan Saas. This visit occurred during the SARS-CoV-2 public health emergency.  Safety protocols were in place, including screening questions prior to the visit, additional usage of staff PPE, and extensive cleaning of exam room while observing appropriate contact time as indicated for disinfecting solutions.   I'm seeing this patient by the request  of:  Danielle Lima, MD  CC: Left knee pain follow-up  SLH:[id number]  10/06/2021 Continue tramadol Patient would like to potentially try to get off of it in the long run.  Will add muscle relaxer at night to see if this will be more beneficial than she is getting with the gabapentin at this time.  Patient is doing 70 to 80% better at this time.  Still had a trace effusion noted.  We did discuss the possibility of MRI which patient declined because she would decline any type of surgical intervention at this point.  We will continue the home exercises and icing regimen and see how patient responds.  Follow-up with me again in 6 weeks  Refer patient for the deviated septum to ENT after fall do think that there is a contributed measure and patient does have some limited air passage noted of the left naris.  Likely cyst noted on the right side of the face.  Will be refer to plastics to have evaluation and likely removal ultrasound does not show any significant abnormal blood flow.  Seems to be freely movable and in the soft tissue with no communication to the underlying musculature.  Update 11/17/2021 Danielle Obrien is a 40 y.o. female coming in with complaint of L knee pain. Patient states knee is swollen. Pain is about the same. Has more ROM. Knee will burn after working shift around patellar tendon.      Past Medical History:  Diagnosis Date   Abnormal Pap smear of cervix     --recurrent ascus w/Pos. 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   23 mm Sorin Carbomedics Top Hat bileaflet mechanical valve via right anterior mini thoracotomy   Tobacco abuse 09/27/2015   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   biopsy and CO2 laser ablation   Past Surgical History:  Procedure Laterality Date   ABDOMINAL HYSTERECTOMY  02/06/2009   Robotic total laparoscopic hysterectomy   ANTERIOR CRUCIATE LIGAMENT REPAIR  1993   AORTIC VALVE REPLACEMENT N/A 11/03/2017   Procedure: MINIMALLY INVASIVE AORTIC VALVE REPLACEMENT( MINI THORACOTOMY);  Surgeon: Rexene Alberts, MD;  Location: [facility];  Service: Open Heart Surgery;  Laterality: N/A;   CARDIAC CATHETERIZATION  June 2015   no CAD - moderate AS noted   CERVICAL BIOPSY  W/ LOOP ELECTRODE EXCISION  11/2008   CIN III w/extension to glands   COLPOSCOPY  10/2008   CIN I &  II   COLPOSCOPY  07/2000   Neg. ECC   COLPOSCOPY  06/2001   CIN I   COLPOSCOPY  08/2004   ECC--atypia   COLPOSCOPY N/A 01/21/2016   Procedure: COLPOSCOPY with vaginal biopsy with CO 2 Laser of Vaginal and vulvar condyloma;  Surgeon: [name], MD;  Location: [facility];  Service: Gynecology;  Laterality: N/A;  [name] will be here 2/21 for 1115 case confirmed 01/16/15 - TS   HERNIA REPAIR  1981/1982   HIP SURGERY  1981   Hip Reset    HIP SURGERY  1990   Plate was reconstructed/ took out growth plate in Right knee   HIP SURGERY  1992   Plate removed in Left hip   LEFT AND RIGHT HEART CATHETERIZATION  WITH CORONARY ANGIOGRAM N/A 05/18/2014   Procedure: LEFT AND RIGHT HEART CATHETERIZATION WITH CORONARY ANGIOGRAM;  Surgeon: Jettie Booze, MD;  Location: [facility] CATH LAB;  Service: Cardiovascular;  Laterality: N/A;   LYMPH NODE BIOPSY  1995   NASAL SEPTUM SURGERY  2002   TEE WITHOUT CARDIOVERSION N/A 11/03/2017   Procedure: TRANSESOPHAGEAL ECHOCARDIOGRAM (TEE);  Surgeon: Rexene Alberts, MD;  Location: [facility];  Service: Open Heart Surgery;  Laterality: N/A;   TONSILLECTOMY AND ADENOIDECTOMY  1990   TYMPANOSTOMY TUBE PLACEMENT  1981/1982   Social History   Socioeconomic History   Marital status: Single    Spouse name: Not on file   Number of children: Not on file   Years of education: Not on file   Highest education level: Not on file  Occupational History   Not on file  Tobacco Use   Smoking status: Light Smoker    Packs/day: 0.50    Years: 12.00    Pack years: 6.00    Types: Cigarettes    Last attempt to quit: 11/02/2017    Years since quitting: 4.0   Smokeless tobacco: Never   Tobacco comments:    pt given fake cigarette for hand/mouth association. pt using Ship [employer] for support  Vaping Use   Vaping Use: Never used  Substance and Sexual Activity   Alcohol use: No    Alcohol/week: 0.0 standard drinks   Drug use: No   Sexual activity: Yes    Partners: Male    Birth control/protection: Surgical    Comment: Hysterectomy  Other Topics Concern   Not on file  Social History Narrative   Not on file   Social Determinants of Health   Financial Resource Strain: Not on file  Food Insecurity: Not on file  Transportation Needs: Not on file  Physical Activity: Not on file  Stress: Not on file  Social Connections: Not on file   Allergies  Allergen Reactions   Demerol Nausea And Vomiting   Ibuprofen Other (See Comments)    Gastritis    Codeine Itching   Family History  Problem Relation Age of Onset   Hyperlipidemia Mother    Hypertension Mother    Thyroid  disease Mother    Hyperlipidemia Father    Hypertension Father    Migraines Father    Hypertension Brother    Hyperlipidemia Brother    Thyroid disease Maternal Grandmother    Thyroid disease Maternal Grandfather    Cancer Neg Hx      Current Outpatient Medications (Cardiovascular):    metoprolol tartrate (LOPRESSOR) 25 MG tablet, Take 0.5 tablets (12.5 mg total) by mouth 2 (two) times daily. Pt needs to make appt with provider for  more refills - 2nd attempt   pravastatin (PRAVACHOL) 20 MG tablet, TAKE ONE TABLET BY MOUTH DAILY  Current Outpatient Medications (Respiratory):    albuterol (PROVENTIL HFA;VENTOLIN HFA) 108 (90 Base) MCG/ACT inhaler, Inhale 2 puffs into the lungs every 6 (six) hours as needed for wheezing or shortness of breath.  Current Outpatient Medications (Analgesics):    traMADol (ULTRAM) 50 MG tablet, TAKE TWO TABLETS BY MOUTH TWICE A DAY  Current Outpatient Medications (Hematological):    warfarin (COUMADIN) 7.5 MG tablet, TAKE AS DIRECTED BY COUMADIN CLINIC FOR 30 DAYS  Current Outpatient Medications (Other):    DULoxetine (CYMBALTA) 30 MG capsule, TAKE ONE CAPSULE BY MOUTH DAILY   gabapentin (NEURONTIN) 300 MG capsule, TAKE TWO CAPSULES BY MOUTH THREE TIMES A DAY   lamoTRIgine (LAMICTAL) 200 MG tablet, TAKE TWO TABLETS BY MOUTH DAILY   lansoprazole (PREVACID) 15 MG capsule, Take 15 mg by mouth daily as needed (for heartburn or acid reflux).    meclizine (ANTIVERT) 12.5 MG tablet, TAKE ONE TABLET BY MOUTH TWICE A DAY AS NEEDED FOR DIZZINESS   ondansetron (ZOFRAN) 4 MG tablet, Take 1 tablet (4 mg total) by mouth every 8 (eight) hours as needed for nausea or vomiting.   tiZANidine (ZANAFLEX) 4 MG tablet, Take 1 tablet (4 mg total) by mouth at bedtime.   Reviewed prior external information including notes and imaging from  primary care provider As well as notes that were available from care everywhere and other healthcare systems.  Past medical history,  social, surgical and family history all reviewed in electronic medical record.  No pertanent information unless stated regarding to the chief complaint.   Review of Systems:  No headache, visual changes, nausea, vomiting, diarrhea, constipation, dizziness, abdominal pain, skin rash, fevers, chills, night sweats, weight loss, swollen lymph nodes,  chest pain, shortness of breath, mood changes. POSITIVE muscle aches, body aches, joint swelling  Objective  Blood pressure 133/88, pulse 74, height 5' (1.524 m), weight 117 lb (53.1 kg), SpO2 99 %.   General: No apparent distress alert and oriented x3 mood and affect normal, dressed appropriately.  HEENT: Pupils equal, extraocular movements intact  Respiratory: Patient's speak in full sentences and does not appear short of breath  Cardiovascular: No lower extremity edema, non tender, no erythema  Gait antalgic MSK: Left knee exam shows the patient does have tenderness to palpation over the lateral joint line.  Patient does have trace effusion noted of the patella joint.  Patient does have some limitation still noted with extension of the knee.  Positive McMurray's noted. Neck exam still has some limited range of motion.  Does have some tenderness to palpation as well.  Limited muscular skeletal ultrasound was performed and interpreted by Hulan Saas, M  Limited ultrasound of patient's left knee still shows the patient does have moderate to severe displacement of the lateral meniscus noted.  Patient does have a patellofemoral effusion noted as well. Impression: Lateral meniscal tear.  Continued effusion noted   Impression and Recommendations:     The above documentation has been reviewed and is accurate and complete Lyndal Pulley, DO

## 2021-11-17 ENCOUNTER — Ambulatory Visit (INDEPENDENT_AMBULATORY_CARE_PROVIDER_SITE_OTHER): Payer: Managed Care, Other (non HMO) | Admitting: Family Medicine

## 2021-11-17 ENCOUNTER — Ambulatory Visit: Payer: Self-pay

## 2021-11-17 ENCOUNTER — Other Ambulatory Visit: Payer: Self-pay

## 2021-11-17 ENCOUNTER — Encounter: Payer: Self-pay | Admitting: Family Medicine

## 2021-11-17 VITALS — BP 133/88 | HR 74 | Ht 60.0 in | Wt 117.0 lb

## 2021-11-17 DIAGNOSIS — S83282A Other tear of lateral meniscus, current injury, left knee, initial encounter: Secondary | ICD-10-CM

## 2021-11-17 DIAGNOSIS — M5030 Other cervical disc degeneration, unspecified cervical region: Secondary | ICD-10-CM | POA: Diagnosis not present

## 2021-11-17 DIAGNOSIS — F1190 Opioid use, unspecified, uncomplicated: Secondary | ICD-10-CM | POA: Diagnosis not present

## 2021-11-17 NOTE — Assessment & Plan Note (Signed)
Here for the knee pain but did discuss with patient cervical degenerative disc disease with history of aortic stenosis patient is doing well with the tramadol dose.  We will not make any significant changes at this time.  Refills continue to be appropriate.

## 2021-11-17 NOTE — Assessment & Plan Note (Signed)
Noted.  Patient does have some instability as well as some locking that does occur.  Still has some effusion noted.  Wants to know how much patellofemoral arthritis could be also contributing as well as the extent of the meniscus tear.  Do feel an MRI is necessary at this time.  Depending on findings we will discuss with patient about treatment options later on.

## 2021-11-17 NOTE — Patient Instructions (Addendum)
Dr. Janace Hoard ENT [phone] [place] [phone] Call Today  When we receive your results we will contact you.

## 2021-12-02 ENCOUNTER — Other Ambulatory Visit: Payer: Self-pay | Admitting: Family Medicine

## 2021-12-08 ENCOUNTER — Institutional Professional Consult (permissible substitution): Payer: Managed Care, Other (non HMO) | Admitting: Plastic Surgery

## 2021-12-11 ENCOUNTER — Other Ambulatory Visit: Payer: Self-pay

## 2021-12-11 ENCOUNTER — Ambulatory Visit (INDEPENDENT_AMBULATORY_CARE_PROVIDER_SITE_OTHER): Payer: Managed Care, Other (non HMO)

## 2021-12-11 DIAGNOSIS — Z7901 Long term (current) use of anticoagulants: Secondary | ICD-10-CM | POA: Diagnosis not present

## 2021-12-11 DIAGNOSIS — Q230 Congenital stenosis of aortic valve: Secondary | ICD-10-CM

## 2021-12-11 DIAGNOSIS — Q231 Congenital insufficiency of aortic valve: Secondary | ICD-10-CM

## 2021-12-11 DIAGNOSIS — Z954 Presence of other heart-valve replacement: Secondary | ICD-10-CM

## 2021-12-11 LAB — POCT INR: INR: 3.2 — AB (ref 2.0–3.0)

## 2021-12-11 NOTE — Patient Instructions (Signed)
Description   Only take 0.5 tablet tomorrow (already taken today's dose) and then continue taking warfarin 1 tablet daily except for 1.5 tablets on Sundays and Thursdays. Recheck INR in 5 weeks. [facility]in [facility] [phone].

## 2021-12-18 ENCOUNTER — Other Ambulatory Visit: Payer: Managed Care, Other (non HMO)

## 2021-12-19 ENCOUNTER — Other Ambulatory Visit: Payer: Self-pay | Admitting: Family Medicine

## 2021-12-21 ENCOUNTER — Other Ambulatory Visit: Payer: Self-pay | Admitting: Internal Medicine

## 2021-12-21 DIAGNOSIS — E785 Hyperlipidemia, unspecified: Secondary | ICD-10-CM

## 2021-12-23 ENCOUNTER — Telehealth: Payer: Self-pay

## 2021-12-23 NOTE — Telephone Encounter (Signed)
°  Patient Consent for Virtual Visit        Danielle Obrien has provided verbal consent on 12/25/2021 for a virtual visit (video or telephone).   CONSENT FOR VIRTUAL VISIT FOR:  Danielle Obrien  By participating in this virtual visit I agree to the following:  I hereby voluntarily request, consent and authorize [facility] and its employed or contracted physicians, physician assistants, nurse practitioners or other licensed health care professionals (the Practitioner), to provide me with telemedicine health care services (the Services") as deemed necessary by the treating Practitioner. I acknowledge and consent to receive the Services by the Practitioner via telemedicine. I understand that the telemedicine visit will involve communicating with the Practitioner through live audiovisual communication technology and the disclosure of certain medical information by electronic transmission. I acknowledge that I have been given the opportunity to request an in-person assessment or other available alternative prior to the telemedicine visit and am voluntarily participating in the telemedicine visit.  I understand that I have the right to withhold or withdraw my consent to the use of telemedicine in the course of my care at any time, without affecting my right to future care or treatment, and that the Practitioner or I may terminate the telemedicine visit at any time. I understand that I have the right to inspect all information obtained and/or recorded in the course of the telemedicine visit and may receive copies of available information for a reasonable fee.  I understand that some of the potential risks of receiving the Services via telemedicine include:  Delay or interruption in medical evaluation due to technological equipment failure or disruption; Information transmitted may not be sufficient (e.g. poor resolution of images) to allow for appropriate medical decision making by the Practitioner;  and/or  In rare instances, security protocols could fail, causing a breach of personal health information.  Furthermore, I acknowledge that it is my responsibility to provide information about my medical history, conditions and care that is complete and accurate to the best of my ability. I acknowledge that Practitioner's advice, recommendations, and/or decision may be based on factors not within their control, such as incomplete or inaccurate data provided by me or distortions of diagnostic images or specimens that may result from electronic transmissions. I understand that the practice of medicine is not an exact science and that Practitioner makes no warranties or guarantees regarding treatment outcomes. I acknowledge that a copy of this consent can be made available to me via my patient portal ([facility]), or I can request a printed copy by calling the office of [facility].    I understand that my insurance will be billed for this visit.   I have read or had this consent read to me. I understand the contents of this consent, which adequately explains the benefits and risks of the Services being provided via telemedicine.  I have been provided ample opportunity to ask questions regarding this consent and the Services and have had my questions answered to my satisfaction. I give my informed consent for the services to be provided through the use of telemedicine in my medical care

## 2021-12-25 ENCOUNTER — Other Ambulatory Visit: Payer: Self-pay

## 2021-12-25 ENCOUNTER — Telehealth: Payer: Managed Care, Other (non HMO) | Admitting: Cardiology

## 2021-12-29 ENCOUNTER — Other Ambulatory Visit: Payer: Self-pay

## 2021-12-29 ENCOUNTER — Ambulatory Visit
Admission: RE | Admit: 2021-12-29 | Discharge: 2021-12-29 | Disposition: A | Discharge status: Home / Self Care | Payer: Managed Care, Other (non HMO) | Source: Ambulatory Visit | Attending: Family Medicine | Admitting: Family Medicine

## 2021-12-29 DIAGNOSIS — S83282A Other tear of lateral meniscus, current injury, left knee, initial encounter: Secondary | ICD-10-CM

## 2022-01-01 ENCOUNTER — Telehealth: Payer: Self-pay | Admitting: Family Medicine

## 2022-01-01 NOTE — Telephone Encounter (Signed)
Please see mychart note

## 2022-01-01 NOTE — Telephone Encounter (Signed)
Attempted to call patient but mailbox is full.  

## 2022-01-01 NOTE — Telephone Encounter (Signed)
Pt anxiously awaiting the results of her MRI knee, done on Monday.  Please note, pt unable to get into MyChart. Please call with results.

## 2022-01-02 ENCOUNTER — Other Ambulatory Visit: Payer: Self-pay | Admitting: Family Medicine

## 2022-01-06 NOTE — Telephone Encounter (Signed)
Attempted to call patient but mailbox is full.  

## 2022-01-14 ENCOUNTER — Telehealth: Payer: Self-pay

## 2022-01-14 ENCOUNTER — Other Ambulatory Visit: Payer: Self-pay | Admitting: Family Medicine

## 2022-01-14 NOTE — Telephone Encounter (Signed)
Spoke with patient per MRI results. She will call back once ready to proceed.

## 2022-01-14 NOTE — Progress Notes (Signed)
Cardiology Office Note:    Date:  01/15/2022   ID:  Danielle Obrien, DOB [birth year], MRN [id number]  PCP:  Janith Lima, MD  Cardiologist:  Fransico Him, MD    Referring MD: Janith Lima, MD   Chief Complaint  Patient presents with   Aortic Stenosis    History of Present Illness:    Danielle Obrien is a 41 y.o. female with a hx of bicuspid AV with severe AS, bipolar disorder, asthma and arthritis.  She underwent minimally invasive AVR with a 3mm Sorin Carbomedics Top Hat bileaflet mechanical valve via right anterior mini thoracotomy on 11/03/2017 by Dr. Roxy Manns after coronary CT showed no CAD.    She is here today for followup and is doing well.  She denies any chest pain or pressure, SOB, DOE (except for allergies), PND, orthopnea, LE edema, palpitations or syncope. She occasionally gets issues with vertigo. She is compliant with her meds and is tolerating meds with no SE.    Past Medical History:  Diagnosis Date   Abnormal Pap smear of cervix    --recurrent ascus w/Pos. 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   23 mm Sorin Carbomedics Top Hat bileaflet mechanical valve via right anterior mini thoracotomy   Tobacco abuse 09/27/2015   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   biopsy and CO2 laser ablation    Past Surgical History:  Procedure Laterality Date   ABDOMINAL HYSTERECTOMY  02/06/2009   Robotic total laparoscopic hysterectomy   ANTERIOR CRUCIATE LIGAMENT REPAIR  1993   AORTIC VALVE REPLACEMENT N/A 11/03/2017    Procedure: MINIMALLY INVASIVE AORTIC VALVE REPLACEMENT( MINI THORACOTOMY);  Surgeon: Rexene Alberts, MD;  Location: [facility];  Service: Open Heart Surgery;  Laterality: N/A;   CARDIAC CATHETERIZATION  June 2015   no CAD - moderate AS noted   CERVICAL BIOPSY  W/ LOOP ELECTRODE EXCISION  11/2008   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 ECC   COLPOSCOPY  06/2001   CIN I   COLPOSCOPY  08/2004   ECC--atypia   COLPOSCOPY N/A 01/21/2016   Procedure: COLPOSCOPY with vaginal biopsy with CO 2 Laser of Vaginal and vulvar condyloma;  Surgeon: [name], MD;  Location: [facility];  Service: Gynecology;  Laterality: N/A;  [name] will be here 2/21 for 1115 case confirmed 01/16/15 - TS   HERNIA REPAIR  1981/1982   HIP SURGERY  1981   Hip Reset    HIP SURGERY  1990   Plate was reconstructed/ took out growth plate in Right knee   HIP SURGERY  1992   Plate removed in Left hip   LEFT AND RIGHT HEART CATHETERIZATION WITH CORONARY ANGIOGRAM N/A 05/18/2014   Procedure: LEFT AND RIGHT HEART CATHETERIZATION WITH CORONARY ANGIOGRAM;  Surgeon: Jettie Booze, MD;  Location: [facility]  CATH LAB;  Service: Cardiovascular;  Laterality: N/A;   LYMPH NODE BIOPSY  1995   NASAL SEPTUM SURGERY  2002   TEE WITHOUT CARDIOVERSION N/A 11/03/2017   Procedure: TRANSESOPHAGEAL ECHOCARDIOGRAM (TEE);  Surgeon: Rexene Alberts, MD;  Location: [facility];  Service: Open Heart Surgery;  Laterality: N/A;   TONSILLECTOMY AND ADENOIDECTOMY  1990   TYMPANOSTOMY TUBE PLACEMENT  1981/1982    Current Medications: Current Meds  Medication Sig   albuterol (PROVENTIL HFA;VENTOLIN HFA) 108 (90 Base) MCG/ACT inhaler Inhale 2 puffs into the lungs every 6 (six) hours as needed for wheezing or shortness of breath.   aspirin EC 81 MG tablet Take 81 mg by mouth daily. Swallow whole.   DULoxetine (CYMBALTA) 30 MG capsule TAKE ONE CAPSULE BY MOUTH DAILY   gabapentin (NEURONTIN) 300 MG capsule  TAKE TWO CAPSULES BY MOUTH THREE TIMES A DAY   lamoTRIgine (LAMICTAL) 200 MG tablet TAKE TWO TABLETS BY MOUTH DAILY   lansoprazole (PREVACID) 15 MG capsule Take 15 mg by mouth daily as needed (for heartburn or acid reflux).    meclizine (ANTIVERT) 12.5 MG tablet TAKE ONE TABLET BY MOUTH TWICE A DAY AS NEEDED FOR DIZZINESS   metoprolol tartrate (LOPRESSOR) 25 MG tablet Take 0.5 tablets (12.5 mg total) by mouth 2 (two) times daily. Pt needs to make appt with provider for more refills - 2nd attempt (Patient taking differently: Take 12.5 mg by mouth daily. Pt needs to make appt with provider for more refills - 2nd attempt)   ondansetron (ZOFRAN) 4 MG tablet Take 1 tablet (4 mg total) by mouth every 8 (eight) hours as needed for nausea or vomiting.   tiZANidine (ZANAFLEX) 4 MG tablet TAKE ONE TABLET BY MOUTH AT BEDTIME   traMADol (ULTRAM) 50 MG tablet TAKE TWO TABLETS BY MOUTH TWICE A DAY   warfarin (COUMADIN) 7.5 MG tablet TAKE AS DIRECTED BY COUMADIN CLINIC FOR 30 DAYS     Allergies:   Demerol, Ibuprofen, and Codeine   Social History   Socioeconomic History   Marital status: Single    Spouse name: Not on file   Number of children: Not on file   Years of education: Not on file   Highest education level: Not on file  Occupational History   Not on file  Tobacco Use   Smoking status: Light Smoker    Packs/day: 0.50    Years: 12.00    Pack years: 6.00    Types: Cigarettes    Last attempt to quit: 11/02/2017    Years since quitting: 4.2   Smokeless tobacco: Never   Tobacco comments:    pt given fake cigarette for hand/mouth association. pt using [employer] broker for support  Vaping Use   Vaping Use: Never used  Substance and Sexual Activity   Alcohol use: No    Alcohol/week: 0.0 standard drinks   Drug use: No   Sexual activity: Yes    Partners: Male    Birth control/protection: Surgical    Comment: Hysterectomy  Other Topics Concern   Not on file  Social History Narrative   Not  on file   Social Determinants of Health   Financial Resource Strain: Not on file  Food Insecurity: Not on file  Transportation Needs: Not on file  Physical Activity: Not on file  Stress: Not on file  Social Connections: Not on file     Family History: The patient's family history includes Hyperlipidemia in her brother, father, and mother; Hypertension in her brother,  father, and mother; Migraines in her father; Thyroid disease in her maternal grandfather, maternal grandmother, and mother. There is no history of Cancer.  ROS:   Please see the history of present illness.    ROS  All other systems reviewed and negative.   EKGs/Labs/Other Studies Reviewed:    The following studies were reviewed today: none  EKG:  EKG is  ordered today.  The ekg ordered today demonstrates NSR with no ST changes  Recent Labs: No results found for requested labs within last 8760 hours.   Recent Lipid Panel    Component Value Date/Time   CHOL 277 (H) 05/23/2020 1020   TRIG 82.0 05/23/2020 1020   HDL 60.90 05/23/2020 1020   CHOLHDL 5 05/23/2020 1020   VLDL 16.4 05/23/2020 1020   LDLCALC 199 (H) 05/23/2020 1020   LDLDIRECT 171.0 01/27/2013 1425    Physical Exam:    VS:  BP 130/70    Pulse 65    Ht 5' (1.524 m)    Wt 117 lb (53.1 kg)    SpO2 94%    BMI 22.85 kg/m     Wt Readings from Last 3 Encounters:  01/15/22 117 lb (53.1 kg)  11/17/21 117 lb (53.1 kg)  10/06/21 113 lb (51.3 kg)    GEN: Well nourished, well developed in no acute distress HEENT: Normal NECK: No JVD; No carotid bruits LYMPHATICS: No lymphadenopathy CARDIAC:RRR, no murmurs, rubs, gallops.  Crisp mechanical heart sounds RESPIRATORY:  Clear to auscultation without rales, wheezing or rhonchi  ABDOMEN: Soft, non-tender, non-distended MUSCULOSKELETAL:  No edema; No deformity  SKIN: Warm and dry NEUROLOGIC:  Alert and oriented x 3 PSYCHIATRIC:  Normal affect   ASSESSMENT:    1. Aortic stenosis due to bicuspid aortic  valve   2. Essential hypertension, benign    PLAN:    In order of problems listed above:  1.  Severe AS -s/p minimally invasive AVR with a 51mm Sorin Carbomedics Top Hat bileaflet mechanical valve via right anterior mini thoracotomy on 11/03/2017 -She remains asymptomatic -She denies any bleeding problems on warfarin -continue warfarin and ASA 81mg  daily -discussed SBE prophylaxis for dental procedures -since she is over 2 years out from her AVR, will repeat 2D echo to make sure it is stable  2.  HTN -Her BP is well controlled on exam today -Continue prescription drug management with metoprolol tartrate 12.5 mg twice daily with as needed refills  Medication Adjustments/Labs and Tests Ordered: Current medicines are reviewed at length with the patient today.  Concerns regarding medicines are outlined above.  No orders of the defined types were placed in this encounter.  No orders of the defined types were placed in this encounter.   Signed, Fransico Him, MD  01/15/2022 11:45 AM    [facility]

## 2022-01-15 ENCOUNTER — Encounter: Payer: Self-pay | Admitting: Cardiology

## 2022-01-15 ENCOUNTER — Ambulatory Visit (INDEPENDENT_AMBULATORY_CARE_PROVIDER_SITE_OTHER): Payer: Managed Care, Other (non HMO)

## 2022-01-15 ENCOUNTER — Other Ambulatory Visit: Payer: Self-pay

## 2022-01-15 ENCOUNTER — Ambulatory Visit (INDEPENDENT_AMBULATORY_CARE_PROVIDER_SITE_OTHER): Payer: Managed Care, Other (non HMO) | Admitting: Cardiology

## 2022-01-15 VITALS — BP 130/70 | HR 65 | Ht 60.0 in | Wt 117.0 lb

## 2022-01-15 DIAGNOSIS — Z7901 Long term (current) use of anticoagulants: Secondary | ICD-10-CM

## 2022-01-15 DIAGNOSIS — Z954 Presence of other heart-valve replacement: Secondary | ICD-10-CM | POA: Diagnosis not present

## 2022-01-15 DIAGNOSIS — Q230 Congenital stenosis of aortic valve: Secondary | ICD-10-CM | POA: Diagnosis not present

## 2022-01-15 DIAGNOSIS — Q231 Congenital insufficiency of aortic valve: Secondary | ICD-10-CM

## 2022-01-15 DIAGNOSIS — I10 Essential (primary) hypertension: Secondary | ICD-10-CM | POA: Diagnosis not present

## 2022-01-15 LAB — POCT INR: INR: 2.3 (ref 2.0–3.0)

## 2022-01-15 MED ORDER — METOPROLOL TARTRATE 25 MG PO TABS: 12.5000 mg | ORAL_TABLET | Freq: Every day | ORAL | 3 refills | Status: DC

## 2022-01-15 NOTE — Patient Instructions (Signed)
Description   Continue taking warfarin 1 tablet daily except for 1.5 tablets on Sundays and Thursdays. Recheck INR in 6 weeks. Coumadin Clinic [phone]     

## 2022-01-15 NOTE — Patient Instructions (Signed)
Medication Instructions:  Your physician recommends that you continue on your current medications as directed. Please refer to the Current Medication list given to you today.  *If you need a refill on your cardiac medications before your next appointment, please call your pharmacy*  Testing/Procedures: Your physician has requested that you have an echocardiogram. Echocardiography is a painless test that uses sound waves to create images of your heart. It provides your doctor with information about the size and shape of your heart and how well your hearts chambers and valves are working. This procedure takes approximately one hour. There are no restrictions for this procedure.  Follow-Up: At [facility], you and your health needs are our priority.  As part of our continuing mission to provide you with exceptional heart care, we have created designated Provider Care Teams.  These Care Teams include your primary Cardiologist (physician) and Advanced Practice Providers (APPs -  Physician Assistants and Nurse Practitioners) who all work together to provide you with the care you need, when you need it.  Your next appointment:   1 year(s)  The format for your next appointment:   In Person  Provider:   Fransico Him, MD

## 2022-01-15 NOTE — Addendum Note (Signed)
Addended by: Antonieta Iba on: 01/15/2022 11:52 AM   Modules accepted: Orders

## 2022-01-16 ENCOUNTER — Other Ambulatory Visit: Payer: Self-pay | Admitting: Family Medicine

## 2022-01-16 NOTE — Addendum Note (Signed)
Addended by: Janan Halter F on: 01/16/2022 07:39 AM   Modules accepted: Orders

## 2022-01-27 ENCOUNTER — Other Ambulatory Visit: Payer: Self-pay | Admitting: Internal Medicine

## 2022-01-27 ENCOUNTER — Telehealth: Payer: Self-pay | Admitting: Internal Medicine

## 2022-01-27 ENCOUNTER — Other Ambulatory Visit: Payer: Self-pay | Admitting: Cardiology

## 2022-01-27 DIAGNOSIS — J209 Acute bronchitis, unspecified: Secondary | ICD-10-CM

## 2022-01-27 MED ORDER — ALBUTEROL SULFATE HFA 108 (90 BASE) MCG/ACT IN AERS: 2.0000 | INHALATION_SPRAY | Freq: Four times a day (QID) | RESPIRATORY_TRACT | 3 refills | Status: DC | PRN

## 2022-01-27 NOTE — Telephone Encounter (Signed)
Pt requesting a refill for albuterol (PROVENTIL HFA;VENTOLIN HFA) 108 (90 Base) MCG/ACT inhaler  Pt inquiring if inhaler is safe to take due to her mechanical heart valves  Pts lov 04-17-2021  Pts nov 01-29-2022  Pt requesting a c/b

## 2022-01-29 ENCOUNTER — Other Ambulatory Visit: Payer: Self-pay | Admitting: Nurse Practitioner

## 2022-01-29 ENCOUNTER — Telehealth: Payer: Self-pay | Admitting: Nurse Practitioner

## 2022-01-29 ENCOUNTER — Telehealth: Payer: Self-pay | Admitting: Internal Medicine

## 2022-01-29 ENCOUNTER — Ambulatory Visit: Payer: Managed Care, Other (non HMO) | Admitting: Nurse Practitioner

## 2022-01-29 ENCOUNTER — Other Ambulatory Visit: Payer: Self-pay

## 2022-01-29 VITALS — BP 168/98 | HR 71 | Temp 98.1°F | Ht 60.0 in | Wt 115.2 lb

## 2022-01-29 DIAGNOSIS — E785 Hyperlipidemia, unspecified: Secondary | ICD-10-CM

## 2022-01-29 DIAGNOSIS — R051 Acute cough: Secondary | ICD-10-CM | POA: Diagnosis not present

## 2022-01-29 LAB — LIPID PANEL
Cholesterol: 270 mg/dL — ABNORMAL HIGH (ref 0–200)
HDL: 62 mg/dL (ref >39.00)
LDL Cholesterol: 197 mg/dL — ABNORMAL HIGH (ref 0–99)
NonHDL: 208.04
Total CHOL/HDL Ratio: 4
Triglycerides: 55 mg/dL (ref 0.0–149.0)
VLDL: 11 mg/dL (ref 0.0–40.0)

## 2022-01-29 LAB — COMPREHENSIVE METABOLIC PANEL
ALT: 10 U/L (ref 0–35)
AST: 19 U/L (ref 0–37)
Albumin: 4.3 g/dL (ref 3.5–5.2)
Alkaline Phosphatase: 92 U/L (ref 39–117)
BUN: 8 mg/dL (ref 6–23)
CO2: 26 mEq/L (ref 19–32)
Calcium: 9.3 mg/dL (ref 8.4–10.5)
Chloride: 104 mEq/L (ref 96–112)
Creatinine, Ser: 0.76 mg/dL (ref 0.40–1.20)
GFR: 97.13 mL/min (ref >60.00)
Glucose, Bld: 86 mg/dL (ref 70–99)
Potassium: 3.9 mEq/L (ref 3.5–5.1)
Sodium: 138 mEq/L (ref 135–145)
Total Bilirubin: 0.5 mg/dL (ref 0.2–1.2)
Total Protein: 7 g/dL (ref 6.0–8.3)

## 2022-01-29 MED ORDER — DOXYCYCLINE HYCLATE 100 MG PO TABS: 100.0000 mg | ORAL_TABLET | Freq: Two times a day (BID) | ORAL | 0 refills | Status: DC

## 2022-01-29 MED ORDER — PRAVASTATIN SODIUM 20 MG PO TABS: 20.0000 mg | ORAL_TABLET | Freq: Every day | ORAL | 1 refills | Status: DC

## 2022-01-29 MED ORDER — DEXTROMETHORPHAN HBR 15 MG/5ML PO SYRP: 10.0000 mL | ORAL_SOLUTION | Freq: Four times a day (QID) | ORAL | 0 refills | Status: DC | PRN

## 2022-01-29 MED ORDER — BENZONATATE 100 MG PO CAPS: 100.0000 mg | ORAL_CAPSULE | Freq: Two times a day (BID) | ORAL | 0 refills | Status: DC | PRN

## 2022-01-29 NOTE — Telephone Encounter (Signed)
Patient verbalize understanding.

## 2022-01-29 NOTE — Telephone Encounter (Signed)
When you have a moment will you look to see if we need to adjust this patient's warfarin? She was started on doxycycline and told me she would reach out to the group that manages her coumadin dosing and discuss this with them. I am not sure that she did reach out and want to make sure she doesn't have any bleeding issues. Thank you! ?

## 2022-01-29 NOTE — Progress Notes (Signed)
? ? ? ?Subjective:  ?Patient ID: Danielle Obrien, female    DOB: [birth year]  Age: 41 y.o. MRN: [id number] ? ?CC:  ?Chief Complaint  ?Patient presents with  ? Cough  ?  X 2 weeks   ? chest congestion  ?  X   ?  ? ? ?HPI  ?This patient arrives today for the above. ? ?Symptoms started about 2 weeks ago.  She is experiencing coughing, wheezing, intermittent chills, shortness of breath, and nasal congestion.  She reports that she took an at-home COVID-19 test which was negative.  She has tried over-the-counter sinus headache medication as well as a nighttime cough syrup without much improvement in her symptoms.  She does have a history of asthma and is taking albuterol inhaler which is helping with her wheezing.  She also has a history of mechanical valve replacement and is on warfarin (last INR 2.3 on 01/15/22).  She reports that she has been on prednisone in the past and this resulted in a bloody nose, so she would prefer not to take this medication if possible.  She is also requesting refill of her pravastatin. ? ?Past Medical History:  ?Diagnosis Date  ? Abnormal Pap smear of cervix   ? --recurrent ascus w/Pos. HR HPV  ? ADD (attention deficit disorder)   ? Alcohol abuse, in remission 2012  ? Allergy   ? Anxiety   ? Aortic stenosis due to bicuspid aortic valve 02/20/2014  ? Arthritis   ? In hips  ? Asthma   ? rare inhaler use  ? Bipolar disorder ([place])   ? Headache   ? Hyperlipidemia with target LDL less than 130 01/27/2013  ? Hypertension   ? Muscle spasms of both lower extremities   ? both hips  ? 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 23 mm Sorin [employer] [place] bileaflet mechanical valve via right anterior mini thoracotomy  ? Tobacco abuse 09/27/2015  ? 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 biopsy and CO2 laser ablation  ? ? ? ? ?Family History  ?Problem Relation Age of Onset  ? Hyperlipidemia Mother   ? Hypertension Mother   ?  Thyroid disease Mother   ? Hyperlipidemia Father   ? Hypertension Father   ? Migraines Father   ? Hypertension Brother   ? Hyperlipidemia Brother   ? Thyroid disease Maternal Grandmother   ? Thyroid disease Maternal Grandfather   ? Cancer Neg Hx   ? ? ?Social History  ? ?Social History Narrative  ? Not on file  ? ?Social History  ? ?Tobacco Use  ? Smoking status: Light Smoker  ?  Packs/day: 0.50  ?  Years: 12.00  ?  Pack years: 6.00  ?  Types: Cigarettes  ?  Last attempt to quit: 11/02/2017  ?  Years since quitting: 4.2  ? Smokeless tobacco: Never  ? Tobacco comments:  ?  pt given fake cigarette for hand/mouth association. pt using [facility] for support  ?Substance Use Topics  ? Alcohol use: No  ?  Alcohol/week: 0.0 standard drinks  ? ? ? ?Current Meds  ?Medication Sig  ? albuterol (VENTOLIN HFA) 108 (90 Base) MCG/ACT inhaler Inhale 2 puffs into the lungs every 6 (six) hours as needed for wheezing or shortness of breath.  ? aspirin EC 81 MG tablet Take 81 mg by mouth daily. Swallow whole.  ? benzonatate (TESSALON) 100 MG capsule Take 1 capsule (  100 mg total) by mouth 2 (two) times daily as needed for cough.  ? dextromethorphan 15 MG/5ML syrup Take 10 mLs (30 mg total) by mouth 4 (four) times daily as needed for cough.  ? doxycycline (VIBRA-TABS) 100 MG tablet Take 1 tablet (100 mg total) by mouth 2 (two) times daily.  ? DULoxetine (CYMBALTA) 30 MG capsule TAKE ONE CAPSULE BY MOUTH DAILY  ? gabapentin (NEURONTIN) 300 MG capsule TAKE TWO CAPSULES BY MOUTH THREE TIMES A DAY  ? lamoTRIgine (LAMICTAL) 200 MG tablet TAKE TWO TABLETS BY MOUTH DAILY  ? lansoprazole (PREVACID) 15 MG capsule Take 15 mg by mouth daily as needed (for heartburn or acid reflux).   ? meclizine (ANTIVERT) 12.5 MG tablet TAKE ONE TABLET BY MOUTH TWICE A DAY AS NEEDED FOR DIZZINESS  ? metoprolol tartrate (LOPRESSOR) 25 MG tablet Take 0.5 tablets (12.5 mg total) by mouth daily.  ? ondansetron (ZOFRAN) 4 MG tablet Take 1 tablet (4 mg total) by  mouth every 8 (eight) hours as needed for nausea or vomiting.  ? pravastatin (PRAVACHOL) 20 MG tablet TAKE ONE TABLET BY MOUTH DAILY  ? tiZANidine (ZANAFLEX) 4 MG tablet TAKE ONE TABLET BY MOUTH AT BEDTIME  ? traMADol (ULTRAM) 50 MG tablet TAKE TWO TABLETS BY MOUTH TWICE A DAY  ? warfarin (COUMADIN) 7.5 MG tablet TAKE 1 TO 1.5 TABLETS BY MOUTH ONCE DAILY AS DIRECTED BY COUMADIN CLINIC  ? ? ?ROS:  ?Review of Systems  ?Constitutional:  Positive for chills. Negative for fever.  ?HENT:  Positive for congestion and sore throat.   ?     (+) PND  ?Respiratory:  Positive for cough, sputum production, shortness of breath (with activity) and wheezing.   ?Cardiovascular:  Positive for chest pain (with coughing).  ?Musculoskeletal:  Positive for myalgias.  ?Neurological:  Positive for headaches.  ? ? ?Objective:  ? ?Today's Vitals: BP (!) 168/98   Pulse 71   Temp 98.1 ?F (36.7 ?C) (Oral)   Ht 5' (1.524 m)   Wt 115 lb 4 oz (52.3 kg)   SpO2 99%   BMI 22.51 kg/m?  ?Vitals with BMI 01/29/2022 01/15/2022 11/17/2021  ?Height _0  _1  _2   ?Weight 115 lbs 4 oz 117 lbs 117 lbs  ?BMI 22.51 22.85 22.85  ?Systolic 664 403 474  ?Diastolic 98 70 88  ?Pulse 71 65 74  ?  ? ?Physical Exam ?Vitals reviewed.  ?Constitutional:   ?   General: She is not in acute distress. ?   Appearance: Normal appearance.  ?HENT:  ?   Head: Normocephalic and atraumatic.  ?Neck:  ?   Vascular: No carotid bruit.  ?Cardiovascular:  ?   Rate and Rhythm: Normal rate and regular rhythm.  ?   Pulses: Normal pulses.  ?   Heart sounds: Normal heart sounds.  ?Pulmonary:  ?   Effort: Pulmonary effort is normal.  ?   Breath sounds: Wheezing present.  ?Skin: ?   General: Skin is warm and dry.  ?Neurological:  ?   General: No focal deficit present.  ?   Mental Status: She is alert and oriented to person, place, and time.  ?Psychiatric:     ?   Mood and Affect: Mood normal.     ?   Behavior: Behavior normal.     ?   Judgment: Judgment normal.  ? ? ? ? ? ? ? ?Assessment  and Plan  ? ?1. Acute cough   ?2. Hyperlipidemia with target LDL less  than 130   ? ? ? ?Plan: ?1.  Because symptoms have been occurring for longer than 2 weeks we will prescribe doxycycline, she tells me she will call her Coumadin clinic to see if she needs to have her INR monitored more frequently while taking this medication.  We will also prescribe dextromethorphan syrup that she can take as needed for cough suppression and Tessalon Perles as needed for cough suppression.  As stated in HPI we will hold off on prednisone due to her history of nosebleeds while taking prednisone.  She does smoke I recommend she try to avoid as much as possible while she is not feeling well, she reports understanding.  She will continue to use her albuterol inhaler as needed as well. ?2.  She is overdue to have metabolic panel and lipid panel checked.  We will have these test collected and I will refill her pravastatin based on these results. ? ? ?Tests ordered ?Orders Placed This Encounter  ?Procedures  ? Comp Met (CMET)  ? Lipid Profile  ? ? ? ? ?Meds ordered this encounter  ?Medications  ? benzonatate (TESSALON) 100 MG capsule  ?  Sig: Take 1 capsule (100 mg total) by mouth 2 (two) times daily as needed for cough.  ?  Dispense:  20 capsule  ?  Refill:  0  ?  Order Specific Question:   Supervising Provider  ?  Answer:   Binnie Rail [[id number]]  ? dextromethorphan 15 MG/5ML syrup  ?  Sig: Take 10 mLs (30 mg total) by mouth 4 (four) times daily as needed for cough.  ?  Dispense:  120 mL  ?  Refill:  0  ?  Order Specific Question:   Supervising Provider  ?  Answer:   Binnie Rail [[id number]]  ? doxycycline (VIBRA-TABS) 100 MG tablet  ?  Sig: Take 1 tablet (100 mg total) by mouth 2 (two) times daily.  ?  Dispense:  14 tablet  ?  Refill:  0  ?  Order Specific Question:   Supervising Provider  ?  Answer:   Binnie Rail [[id number]]  ? ? ?Patient to follow-up as needed if symptoms persist or worsen, and within 3 months or so for annual  physical exam with primary care provider ?Ailene Ards, NP ? ?

## 2022-01-29 NOTE — Telephone Encounter (Signed)
Pharmacy states they do not have dextromethorphan 15 MG/5ML syrup in stock ? ?Pharmacy inquiring if provider wants to send an alternative rx in ?

## 2022-01-29 NOTE — Patient Instructions (Signed)
Doxycycline Capsules or Tablets ?What is this medication? ?DOXYCYCLINE (dox i SYE kleen) treats infections caused by bacteria. It belongs to a group of medications called tetracycline antibiotics. It will not treat colds, the flu, or infections caused by viruses. ?This medicine may be used for other purposes; ask your health care provider or pharmacist if you have questions. ?COMMON BRAND NAME(S): Acticlate, Adoxa, Adoxa CK, Adoxa Pak, Adoxa TT, Alodox, Avidoxy, Doxal, LYMEPAK, Mondoxyne NL, Monodox, Morgidox 1x, Morgidox 1x Kit, Morgidox 2x, Morgidox 2x Kit, NutriDox, Ocudox, [place], [place], [place], Vibra-Tabs, Vibramycin ?What should I tell my care team before I take this medication? ?They need to know if you have any of these conditions: ?Kidney disease ?Liver disease ?Long exposure to sunlight like working outdoors ?Recent stomach surgery ?Stomach or intestine problems such as colitis ?Vision Problems ?Yeast or fungal infection of the mouth or vagina ?An unusual or allergic reaction to doxycycline, tetracycline antibiotics, other medications, foods, dyes, or preservatives ?Pregnant or trying to get pregnant ?Breast-feeding ?How should I use this medication? ?Take this medication by mouth with water. Take it as directed on the prescription label at the same time every day. It is best to take this medication without food, but if it upsets your stomach take it with food. Take all of this medication unless your care team tells you to stop it early. Keep taking it even if you think you are better. ?Take antacids and products with aluminum, calcium, magnesium, iron, and zinc in them at a different time of day than this medication. Talk to your care team if you have questions. ?Talk to your care team regarding the use of this medication in children. While this medication may be prescribed for selected conditions, precautions do apply. ?Overdosage: If you think you have taken too much of this medicine contact a  poison control center or emergency room at once. ?NOTE: This medicine is only for you. Do not share this medicine with others. ?What if I miss a dose? ?If you miss a dose, take it as soon as you can. If it is almost time for your next dose, take only that dose. Do not take double or extra doses. ?What may interact with this medication? ?Antacids, vitamins, or other products that contain aluminum, calcium, iron, magnesium, or zinc ?Barbiturates ?Birth control pills ?Bismuth subsalicylate ?Carbamazepine ?Methoxyflurane ?Oral retinoids such as acitretin, isotretinoin ?Other antibiotics ?Phenytoin ?Warfarin ?This list may not describe all possible interactions. Give your health care provider a list of all the medicines, herbs, non-prescription drugs, or dietary supplements you use. Also tell them if you smoke, drink alcohol, or use illegal drugs. Some items may interact with your medicine. ?What should I watch for while using this medication? ?Tell your care team if your symptoms do not improve. ?Do not treat diarrhea with over the counter products. Contact your care team if you have diarrhea that lasts more than 2 days or if it is severe and watery. ?Do not take this medication just before going to bed. It may not dissolve properly when you lay down and can cause pain in your throat. Drink plenty of fluids while taking this medication to also help reduce irritation in your throat. ?This medication can make you more sensitive to the sun. Keep out of the sun. If you cannot avoid being in the sun, wear protective clothing and use sunscreen. Do not use sun lamps or tanning beds/booths. ?Birth control pills may not work properly while you are taking this medication. Talk to your  care team about using an extra method of birth control. ?If you are being treated for a sexually transmitted infection, avoid sexual contact until you have finished your treatment. Your sexual partner may also need treatment. ?If you are using this  medication to prevent malaria, you should still protect yourself from contact with mosquitos. Stay in screened-in areas, use mosquito nets, keep your body covered, and use an insect repellent. ?What side effects may I notice from receiving this medication? ?Side effects that you should report to your care team as soon as possible: ?Allergic reactions--skin rash, itching, hives, swelling of the face, lips, tongue, or throat ?Increased pressure around the brain--severe headache, change in vision, blurry vision, nausea, vomiting ?Joint pain ?Pain or trouble swallowing ?Redness, blistering, peeling, or loosening of the skin, including inside the mouth ?Severe diarrhea, fever ?Unusual vaginal discharge, itching, or odor ?Side effects that usually do not require medical attention (report these to your care team if they continue or are bothersome): ?Change in tooth color ?Diarrhea ?Headache ?Heartburn ?Nausea ?This list may not describe all possible side effects. Call your doctor for medical advice about side effects. You may report side effects to FDA at 1-800-FDA-1088. ?Where should I keep my medication? ?Keep out of the reach of children and pets. ?Store at room temperature, below 30 degrees C (86 degrees F). Protect from light. Keep container tightly closed. Throw away any unused medication after the expiration date. Taking this medication after the expiration date can make you seriously ill. ?NOTE: This sheet is a summary. It may not cover all possible information. If you have questions about this medicine, talk to your doctor, pharmacist, or health care provider. ?? 2022 Elsevier/Gold Standard (2021-02-01 00:00:00) ? ?

## 2022-01-30 NOTE — Telephone Encounter (Signed)
Please advise 

## 2022-01-30 NOTE — Telephone Encounter (Signed)
Talked to pharmacist in cardiology coumadin clinic and she requested this note be forwarded to coumadin clinic pool. ?Forwarded to pool to f/u with pt regarding any necessary changes to warfarin due to addition of doxycycline. ?

## 2022-01-30 NOTE — Telephone Encounter (Signed)
This pt's warfarin is managed by cardiology. Forwarding note to nurse in cardiology.  ?

## 2022-01-30 NOTE — Telephone Encounter (Signed)
Received message that pt is taking doxycycline which interacts with Warfarin and requires frequent monitoring while taking.  ? ?Called pt and she was made aware that we will need to check her INR on Monday due to the interaction of the two meds. She was confused about the meds and explained that I was calling in reference to the doxycycline-the antibiotic. After helping her figure out which med I was calling about, I was able to set her an appt in the office to check her INR. Advised that she does not need to change this appt cause it's very important to have this level checked while taking this new medication and she verbalized understanding.  ?

## 2022-01-30 NOTE — Telephone Encounter (Signed)
This is not our patient, it looks like they see Fransico Him, MD for Cardiology

## 2022-02-02 ENCOUNTER — Other Ambulatory Visit: Payer: Self-pay

## 2022-02-02 ENCOUNTER — Ambulatory Visit (INDEPENDENT_AMBULATORY_CARE_PROVIDER_SITE_OTHER): Payer: Managed Care, Other (non HMO) | Admitting: *Deleted

## 2022-02-02 DIAGNOSIS — Z5181 Encounter for therapeutic drug level monitoring: Secondary | ICD-10-CM | POA: Diagnosis not present

## 2022-02-02 DIAGNOSIS — Z954 Presence of other heart-valve replacement: Secondary | ICD-10-CM | POA: Diagnosis not present

## 2022-02-02 DIAGNOSIS — Z7901 Long term (current) use of anticoagulants: Secondary | ICD-10-CM

## 2022-02-02 LAB — POCT INR: INR: 3.9 — AB (ref 2.0–3.0)

## 2022-02-02 NOTE — Patient Instructions (Signed)
Description   ?Hold warfarin tomorrow.  While you are on the doxy take warfarin 1 tablet daily.  Eat an extra serving of greens while on doxy. Pt should finish doxy by 3/10.  ?On 3/11 resume your normal dose of warfarin 1 tablet daily except for 1.5 tablets on Sundays and Thursdays. Recheck INR in 1 week. Coumadin Clinic [phone] ?  ? ? ?

## 2022-02-03 ENCOUNTER — Other Ambulatory Visit: Payer: Self-pay | Admitting: Family Medicine
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
## 2022-02-05 ENCOUNTER — Encounter: Payer: Self-pay | Admitting: Obstetrics and Gynecology

## 2022-02-05 ENCOUNTER — Ambulatory Visit (INDEPENDENT_AMBULATORY_CARE_PROVIDER_SITE_OTHER): Payer: Managed Care, Other (non HMO) | Admitting: Obstetrics and Gynecology

## 2022-02-05 ENCOUNTER — Other Ambulatory Visit (HOSPITAL_COMMUNITY): Payer: Managed Care, Other (non HMO)

## 2022-02-05 ENCOUNTER — Other Ambulatory Visit (HOSPITAL_COMMUNITY)
Admission: RE | Admit: 2022-02-05 | Discharge: 2022-02-05 | Disposition: A | Discharge status: Home / Self Care | Payer: Managed Care, Other (non HMO) | Source: Ambulatory Visit | Attending: Obstetrics and Gynecology | Admitting: Obstetrics and Gynecology

## 2022-02-05 ENCOUNTER — Other Ambulatory Visit: Payer: Self-pay

## 2022-02-05 VITALS — BP 160/88 | HR 66 | Ht 59.5 in | Wt 119.0 lb

## 2022-02-05 DIAGNOSIS — A630 Anogenital (venereal) warts: Secondary | ICD-10-CM

## 2022-02-05 DIAGNOSIS — Z01419 Encounter for gynecological examination (general) (routine) without abnormal findings: Secondary | ICD-10-CM | POA: Insufficient documentation

## 2022-02-05 DIAGNOSIS — N9089 Other specified noninflammatory disorders of vulva and perineum: Secondary | ICD-10-CM | POA: Diagnosis not present

## 2022-02-05 NOTE — Patient Instructions (Signed)

## 2022-02-06 LAB — CYTOLOGY - PAP
Comment: NEGATIVE
Diagnosis: NEGATIVE
High risk HPV: POSITIVE — AB

## 2022-02-10 ENCOUNTER — Other Ambulatory Visit: Payer: Self-pay | Admitting: Obstetrics and Gynecology

## 2022-02-10 NOTE — Telephone Encounter (Signed)
Please assist with preparation and scheduling of vulvar biopsies for in office procedure. ? ?Patient has vulvar lesions and condyloma that will require approximately three 4 - 5 mm punch biopsies and possible simple sutures in each location.  ? ?She has had an aortic valve replacement and she is on coumadin.  ? ?I need guidance from the coumadin clinic regarding her coumadin medication and these small biopsies.  ? ? ?

## 2022-02-10 NOTE — Telephone Encounter (Signed)
-----   Message from [name], MD sent at 02/10/2022  9:41 AM EDT ----- ?Please contact patient about her pap showing positive HR HPV and normal cells.  ?She has a history of cervical and vaginal dysplasia.  ? ?I recommend a colposcopy.  ? ?Please see phone note about vulvar biopsies.  ?I would recommend she does colposcopy and vulvar biopsies on the same day.  ?She will need a one hour appointment with me.  ? ?We will need recommendations from the coumadin clinic regarding her coumadin dosing prior to these procedures. ?

## 2022-02-10 NOTE — Telephone Encounter (Signed)
Please also see result note regarding pap showing normal cells and positive HR HPV.  ? ?Please let her know that I recommend a colposcopy on the same day as the vulvar biopsies.  ? ?I will need one hour of time for everything to be done on the same day.  ?

## 2022-02-10 NOTE — Telephone Encounter (Signed)
Burnice Logan, RN  ?02/10/2022  9:52 AM EDT Back to Top  ?  ?Left message to call Sharee Pimple, RN at [facility], [phone]. ?  ?See telephone encounter dated 02/10/22  ? ?

## 2022-02-11 ENCOUNTER — Telehealth: Payer: Self-pay | Admitting: Cardiology

## 2022-02-11 NOTE — Telephone Encounter (Signed)
? ? ?  Pre-operative Risk Assessment  ?  ?Patient Name: Danielle Obrien  ?DOB: 08-24-1981 ?MRN: [id number]  ? ?  ? ?Request for Surgical Clearance   ? ?Procedure:   colposcopy and vulvar biopsy ? ?Date of Surgery:  Clearance 03/19/22                              ?   ?Surgeon:  Dr. Rolly Pancake ?Surgeon's Group or Practice Name:  [facility] ?Phone number:  [phone] ?Fax number:  [phone] ?  ?Type of Clearance Requested:   ?- Medical  ?- Pharmacy:  Hold Warfarin (Coumadin) defer to cards ?  ?Type of Anesthesia:  None  ?  ?Additional requests/questions:   ? ?Signed, ?[place]   ?02/11/2022, 10:24 AM   ?

## 2022-02-11 NOTE — Telephone Encounter (Signed)
Call placed to [facility] [address] [facility]/ Dr. Radford Pax.  ?Spoke with [employer] [employer] Coumadin recommendations requested. Colposcopy and vulvar biopsies scheduled In office on 03/19/22.  ? ? ?

## 2022-02-11 NOTE — Telephone Encounter (Signed)
Patient takes warfarin for mechanical AVR. ? ?Procedure: colposcopy and vulvar biopsy ?Date of procedure: 03/19/22 ? ?CrCl 2m/min ?Platelet count 151K ? ?Per office protocol, patient can hold warfarin for 5 days prior to procedure. Patient will not need bridging with Lovenox (enoxaparin) around procedure. ?

## 2022-02-11 NOTE — Telephone Encounter (Signed)
Spoke with patient, advised of results and recommendations per Dr. Quincy Simmonds. Patient agreeable to proceed with scheduling in April, Thursdays only. Hx of hysterectomy.  ? ?Colpo and vulvar biopsy scheduled for 03/19/22 at 1130. Orders placed.  ? ?Patient requesting antianxiety medication to take prior to procedures. Advised patient she will need to come into office prior to day of procedure and sign procedure consents and will need a driver day of, patient agreeable. Advised I will review request with Dr. Quincy Simmonds and f/u, patient agreeable. Pharmacy confirmed.  ? ?Advised patient I will contact the coumadin clinic for recommendations regarding coumadin.  ? ? ?Dr. Quincy Simmonds -please review and advise on Rx.  ? ?Cc: Hayley Carder ?

## 2022-02-11 NOTE — Telephone Encounter (Signed)
Clinical pharmacist to review Coumadin.  She has a history of bicuspid aortic valve with severe AS, she underwent minimally invasive AVR with a 79m Sorin Carbomedics Top Hat bileaflet mechanical valve via right anterior mini thoracotomy on 11/03/2017 by Dr. ORoxy Mannsafter coronary CT showed no CAD ?

## 2022-02-12 ENCOUNTER — Other Ambulatory Visit: Payer: Self-pay

## 2022-02-12 ENCOUNTER — Ambulatory Visit (INDEPENDENT_AMBULATORY_CARE_PROVIDER_SITE_OTHER): Payer: Managed Care, Other (non HMO) | Admitting: *Deleted

## 2022-02-12 DIAGNOSIS — Z7901 Long term (current) use of anticoagulants: Secondary | ICD-10-CM

## 2022-02-12 DIAGNOSIS — Z954 Presence of other heart-valve replacement: Secondary | ICD-10-CM | POA: Diagnosis not present

## 2022-02-12 DIAGNOSIS — Z5181 Encounter for therapeutic drug level monitoring: Secondary | ICD-10-CM

## 2022-02-12 LAB — POCT INR: INR: 1 — AB (ref 2.0–3.0)

## 2022-02-12 NOTE — Patient Instructions (Addendum)
Description   ?Take 2 tablets total of warfarin today ?Tomorrow take 1.5 tablets.  ?Continue to take warfarin 1 tablet daily except for 1.5 tablets on Sundays and Thursdays. Recheck INR in 1 wk. Coumadin Clinic [phone] ?*Biopsy 4/20* Okay to hold 5 days prior,  Clearance note 3/15 ?  ?  ?

## 2022-02-17 NOTE — Telephone Encounter (Signed)
1st attempt to call pt. LM to call back and speak to someone on Pre-op team. ?

## 2022-02-17 NOTE — Telephone Encounter (Signed)
? ? ?  Name: Danielle Obrien  ?DOB: [birth year]  ?MRN: [id number] ? ?Primary Cardiologist: Fransico Him, MD ? ? ?Preoperative team, please contact this patient and set up a phone call appointment for further preoperative risk assessment. Please obtain consent and complete medication review. Thank you for your help. ? ? ?Ledora Bottcher, PA-C ?02/17/2022, 6:21 AM ?[phone] ?[facility] ?[address] ?[place], [place] [address] ? ? ?

## 2022-02-18 NOTE — Telephone Encounter (Signed)
Left message for the pt to call the office to offer a telephone pre op appt.  ?

## 2022-02-19 ENCOUNTER — Ambulatory Visit (HOSPITAL_COMMUNITY): Payer: Managed Care, Other (non HMO) | Attending: Cardiovascular Disease

## 2022-02-19 ENCOUNTER — Ambulatory Visit (INDEPENDENT_AMBULATORY_CARE_PROVIDER_SITE_OTHER): Payer: Managed Care, Other (non HMO)

## 2022-02-19 ENCOUNTER — Other Ambulatory Visit: Payer: Self-pay

## 2022-02-19 ENCOUNTER — Encounter: Payer: Self-pay | Admitting: *Deleted

## 2022-02-19 DIAGNOSIS — Z954 Presence of other heart-valve replacement: Secondary | ICD-10-CM

## 2022-02-19 DIAGNOSIS — I10 Essential (primary) hypertension: Secondary | ICD-10-CM | POA: Diagnosis present

## 2022-02-19 DIAGNOSIS — Z7901 Long term (current) use of anticoagulants: Secondary | ICD-10-CM | POA: Diagnosis not present

## 2022-02-19 DIAGNOSIS — I517 Cardiomegaly: Secondary | ICD-10-CM

## 2022-02-19 DIAGNOSIS — Q230 Congenital stenosis of aortic valve: Secondary | ICD-10-CM | POA: Diagnosis present

## 2022-02-19 DIAGNOSIS — Q231 Congenital insufficiency of aortic valve: Secondary | ICD-10-CM | POA: Diagnosis not present

## 2022-02-19 DIAGNOSIS — I77810 Thoracic aortic ectasia: Secondary | ICD-10-CM | POA: Diagnosis not present

## 2022-02-19 LAB — ECHOCARDIOGRAM COMPLETE
AV Mean grad: 7.5 mmHg
AV Peak grad: 14 mmHg
Ao pk vel: 1.87 m/s
Area-P 1/2: 3.37 cm2
S' Lateral: 2.9 cm

## 2022-02-19 LAB — POCT INR: INR: 1.8 — AB (ref 2.0–3.0)

## 2022-02-19 NOTE — Telephone Encounter (Signed)
Left message x 3 to call back to set up tele pre op appt. I will send a letter to the pt today as well to please call the office. I will remove from the call back pool at this time. I will update the requesting office we have been unable to reach the pt for an appt.  ?

## 2022-02-19 NOTE — Telephone Encounter (Signed)
Letter sent to pt through [facility] ?

## 2022-02-19 NOTE — Patient Instructions (Signed)
Description   ?Take 2 tablets today and then continue to take warfarin 1 tablet daily except for 1.5 tablets on Sundays and Thursdays. Recheck INR in 2 weeks. Coumadin Clinic [phone] ? ?*Biopsy 4/20* Okay to hold 5 days prior,  Clearance note 3/15 ?  ?   ?

## 2022-02-24 NOTE — Telephone Encounter (Signed)
Dr. Quincy Simmonds -please advise on anti-anxiety Rx prior to procedure.  ?

## 2022-02-24 NOTE — Telephone Encounter (Signed)
Recommendations received from the cardiology clinic.  ? ?They recommend she hold coumadin for 5 days prior to procedure.  ?No Lovenox bridge is needed.  ? ?Please inform patient.  ?No coumadin 4/16, 4/17, 4/18, 4/19, 4/20.  ? ?Vulvar biopsies are scheduled for 03/19/22.  ?

## 2022-02-24 NOTE — Telephone Encounter (Signed)
Ok for Ativan 1 mg po 1 hour prior to procedure.  ?Disp:  2 ?RF:  none ? ?She will need to sign her consent form prior to taking the medication.  ? ?She will need a driver to take her home from the appointment.  ?

## 2022-02-25 NOTE — Telephone Encounter (Signed)
Call to patient, mailbox full, unable to leave message.  ? ?MyChart message to patient.  ?

## 2022-03-02 MED ORDER — LORAZEPAM 1 MG PO TABS: 1.0000 mg | ORAL_TABLET | ORAL | 0 refills | Status: DC | PRN

## 2022-03-02 NOTE — Telephone Encounter (Signed)
Spoke with patient. Advised as seen below regarding Rx for anxiety and coumadin. Patient read back coumadin instructions. Patient will come into the office on 4/6 to sign procedure consent, is aware to ask for [place].  ? ?Patient takes Aspirin 81 mg tab daily, does she need to hold prior to procedure?  ? ?Rx pended for Ativan, Dr. Quincy Simmonds -please sign order. Unable to print.  ?

## 2022-03-02 NOTE — Telephone Encounter (Signed)
Patient does not need to stop her daily aspirin prior to the procedure.  ?

## 2022-03-05 ENCOUNTER — Other Ambulatory Visit: Payer: Self-pay | Admitting: Family Medicine

## 2022-03-05 ENCOUNTER — Ambulatory Visit (INDEPENDENT_AMBULATORY_CARE_PROVIDER_SITE_OTHER): Payer: Managed Care, Other (non HMO) | Admitting: *Deleted

## 2022-03-05 DIAGNOSIS — Q230 Congenital stenosis of aortic valve: Secondary | ICD-10-CM

## 2022-03-05 DIAGNOSIS — Z7901 Long term (current) use of anticoagulants: Secondary | ICD-10-CM | POA: Diagnosis not present

## 2022-03-05 DIAGNOSIS — Q231 Congenital insufficiency of aortic valve: Secondary | ICD-10-CM

## 2022-03-05 DIAGNOSIS — Z954 Presence of other heart-valve replacement: Secondary | ICD-10-CM

## 2022-03-05 LAB — POCT INR: INR: 2.2 (ref 2.0–3.0)

## 2022-03-05 NOTE — Telephone Encounter (Signed)
Patient in office. Procedure consent signed. Consent to [place], CMA.  ? ?Patient advised per Dr. Quincy Simmonds regarding Aspirin. Patient verbalizes understanding and is agreeable.  ? ?Routing to provider for final review. Patient is agreeable to disposition. Will close encounter. ? ?Cc: Estill Bamberg, CMA ?

## 2022-03-05 NOTE — Patient Instructions (Signed)
Description   ?Continue to take warfarin 1 tablet daily except for 1.5 tablets on Sundays and Thursdays. Recheck INR in 1 week post procedure. Coumadin Clinic [phone] ? ?*Biopsy 4/20* Okay to hold 5 days prior,  Clearance note 3/15 ?  ? After procedure resume normal dose of warfarin and report to appointment  ?

## 2022-03-16 NOTE — Progress Notes (Signed)
?  Subjective:  ?  ? Patient ID: Danielle Obrien, female   DOB: 03/20/1981, 41 y.o.   MRN: [id number] ? ?HPI ?Patient here today for colposcopy and vulvar biopsy with pap 02-05-22 Neg:Pos HR HPV. ?Had hysterectomy for cervical dysplasia and has history of extensive vulvar condyloma treated with laser ablation in past.  ?She has vulvar skin change and condyloma between the vagina and the rectum.   ? ?Took Lorazapam 1 mg and triazadine 4 mg prior to this visit.  ?Mother brought her to the appointment and will take her home today. ? ?Last took coumadin 5 days ago.  ? ?PAP HISTORY: ?02-05-22 Neg:Pos HR HPV,  01-12-19 Neg:Pos HR HPV;Neg 16/18/45 ? 05-17-17 Neg:Pos HR HPV;colpo revealed VAIN I,09-18-15 LSIL:Pos HR HPV;colpo/vulvar bx 10-11-15--VAIN II and condyloma of vulva.  ? ?Review of Systems ?LMP:  Hyst ? ?   ?Objective:  ? Physical Exam ? ?Colposcopy with vaginal and vulvar biopsies.  ?Consent done in advance of this visit today. ?Vaginal colposcopy and biopsy.  ?3% acetic acid used in vagina. ?Cervix absent.  ?Patch of acetowhite change at right vaginal cuff and confirmed decreaed uptake of Lugols after this was applied.  ?Biopsy of right vaginal cuff with Tischler and sent to pathology as specimen #1.  ?Monsel's applied.  ? ?Vulvar colposcopy: ?3% acetic acid used to vulva. ?Has 4 - 5 cm area of skin of the right vulva with scaling and eschars and patchy pigmentation change. ?Scattered raised greyish areas 3 - 4 mm consistent with suspected condyloma. ?10 mm exophytic white lesion of the perineum in midline.  ? Local 1% lidocaine to right mons pubis, left buttock and perineum - lot [id number], exp 03/31/23 ?Biopsies taken of vulva:. ?4 mm punch biopsy of left buttock grey lesion and sent to pathology as specimen #2.   ?Scalpel used to sharply excise perineal biopsy sent to pathology as specimen #3. ?4 mm punch biopsy of right mons pubis lesion and sent to pathology as specimen #3. ?Interrupted sutures of 3/0 Vicryl placed at  biopsies sites.  ?Minimal EBL.  ?No complications.  ? ?Biopsies: ?1 - right vaginal cuff biopsy ?2 - left buttock biopsy ?3 - perineal excisional biopsy ?4 - right mons pubis biopsy ? ?   ?Assessment:  ?   ?Status post hysterectomy for high grade cervical dysplasia. ?Hx extensive vulvar condyloma.  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 ?Vaginal lesion.  ?Vulvar lesions. ?   ?Plan:  ?   ?Resume coumadin tomorrow. ?Fu biopsy results.  ?Tylenol for pain.  ?Return to unrestricted work on 03/24/22.  ?Return in 3 weeks for incision checks.  ?Precautions given. ? ?   ?

## 2022-03-19 ENCOUNTER — Encounter: Payer: Self-pay | Admitting: Obstetrics and Gynecology

## 2022-03-19 ENCOUNTER — Encounter: Payer: Self-pay | Admitting: *Deleted

## 2022-03-19 ENCOUNTER — Encounter: Payer: Managed Care, Other (non HMO) | Admitting: Internal Medicine

## 2022-03-19 ENCOUNTER — Other Ambulatory Visit (HOSPITAL_COMMUNITY)
Admission: RE | Admit: 2022-03-19 | Discharge: 2022-03-19 | Disposition: A | Discharge status: Home / Self Care | Payer: Managed Care, Other (non HMO) | Source: Ambulatory Visit | Attending: Obstetrics and Gynecology | Admitting: Obstetrics and Gynecology

## 2022-03-19 ENCOUNTER — Ambulatory Visit (INDEPENDENT_AMBULATORY_CARE_PROVIDER_SITE_OTHER): Payer: Managed Care, Other (non HMO) | Admitting: Obstetrics and Gynecology

## 2022-03-19 VITALS — BP 124/80 | Ht 59.0 in | Wt 124.0 lb

## 2022-03-19 DIAGNOSIS — N9089 Other specified noninflammatory disorders of vulva and perineum: Secondary | ICD-10-CM | POA: Insufficient documentation

## 2022-03-19 DIAGNOSIS — A630 Anogenital (venereal) warts: Secondary | ICD-10-CM | POA: Diagnosis not present

## 2022-03-19 DIAGNOSIS — R87811 Vaginal high risk human papillomavirus (HPV) DNA test positive: Secondary | ICD-10-CM

## 2022-03-19 DIAGNOSIS — N898 Other specified noninflammatory disorders of vagina: Secondary | ICD-10-CM

## 2022-03-19 NOTE — Patient Instructions (Addendum)
Vulva Biopsy, Care After The following information offers guidance on how to care for yourself after your procedure. Your health care provider may also give you more specific instructions. If you have problems or questions, contact your health care provider. What can I expect after the procedure? After the procedure, it is common to have: Slight bleeding from the biopsy site. Slight pain or discomfort at the biopsy site. Follow these instructions at home: Biopsy site care  Follow instructions from your health care provider about how to take care of your biopsy site. Make sure you: Clean the area using water and mild soap twice a day or as told by your health care provider. Gently pat the area dry. You may shower 24 hours after the procedure. If you were prescribed an antibiotic ointment, apply it as told by your health care provider. Do not stop using the antibiotic even if your condition improves. If told by your health care provider, take a sitz bath to help with pain and discomfort. This is a warm water bath that you take while sitting down. Do this as often as told by your health care provider. The water should only come up to your hips and cover your buttocks. You may pat the area dry with a soft, clean towel. Leave stitches (sutures), skin glue, or adhesive strips in place. These skin closures may need to stay in place for 2 weeks or longer. If adhesive strip edges start to loosen and curl up, you may trim the loose edges. Do not remove adhesive strips completely unless your health care provider tells you to do that. Check your biopsy site every day for signs of infection. It may be helpful to use a handheld mirror to do this. Check for: Redness, swelling, or more pain. More fluid or blood. Warmth. Pus or a bad smell. Do not rub the biopsy area after urinating. Gently pat the area dry or use a bottle filled with warm water (peri bottle) to clean the area. Gently wipe from front to  back. Lifestyle Wear loose, cotton underwear. Do not wear tight pants. For at least 1 week or until your health care provider approves: Do not use tampons, douche, or put anything inside your vagina. Do not have sex. Until your health care provider approves: Do not exercise, such as running or biking. Do not swim or use a hot tub. General instructions Take over-the-counter and prescription medicines only as told by your health care provider. Drink enough fluid to keep your urine pale yellow. Use a sanitary napkin until the bleeding stops. If told, put ice on the biopsy site. To do this: Place ice in a plastic bag. Place a towel between your skin and the bag. Leave the ice on for 20 minutes, 2-3 times a day. Remove the ice if your skin turns bright red. This is very important. If you cannot feel pain, heat, or cold, you have a greater risk of damage to the area. Keep all follow-up visits. This is important. Contact a health care provider if: You have redness, swelling, or more pain around your biopsy site. You have more fluid or blood coming from your biopsy site. Your biopsy site feels warm to the touch. Your pain is not controlled with medicine or ice packs. You have a fever or chills. Get help right away if: You have heavy bleeding from the vulva. You have pus or a bad smell coming from the biopsy site. You have abdominal pain. Summary After the procedure, it   is common to have slight bleeding and discomfort at the biopsy site. Follow instructions from your health care provider after your biopsy. Take sitz baths as told by your health care provider to help with pain and discomfort. Leave any sutures in place. Contact your health care provider if you notice any signs of infection around the biopsy site, including redness, swelling, more pain, more fluid or blood, or warmth. Keep all follow-up visits. This is important. This information is not intended to replace advice given to you  by your health care provider. Make sure you discuss any questions you have with your health care provider. Document Revised: 08/05/2021 Document Reviewed: 08/05/2021 Elsevier Patient Education  2023 [employer]  

## 2022-03-23 LAB — SURGICAL PATHOLOGY

## 2022-03-24 ENCOUNTER — Telehealth: Payer: Self-pay | Admitting: *Deleted

## 2022-03-24 DIAGNOSIS — R8769 Abnormal cytological findings in specimens from other female genital organs: Secondary | ICD-10-CM

## 2022-03-24 NOTE — Telephone Encounter (Signed)
-----   Message from [name], MD sent at 03/23/2022  5:26 PM EDT ----- ?Will need to make a referral to GYN ONC after I have had a chance to speak to the patient. ? ?Hi Danielle Obrien,  ? ?I just called you to review your biopsy results, but I was unable to reach you.  ?I left a message on your cell phone that there is no cancer but that you will need further treatment.  ? ?There is evidence of precancerous change.  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  ? ?I would like to refer you to a specialist for further evaluation and treatment.  ?Her name is Dr. Valarie Cones, and she is a gynecologist oncologist from [facility] who sees patients at the [facility], located at the [place].  ? ?Please return my call tomorrow to the office at [phone], so we can discuss this further.  ? ?I hope you had a good weekend.  ? ?Josefa Half, MD ? ? ?

## 2022-03-24 NOTE — Telephone Encounter (Signed)
Patient can't log into my chart. Informed with all the below, referral placed. Patient aware GYN oncology will call to schedule. ?

## 2022-03-26 ENCOUNTER — Telehealth: Payer: Self-pay | Admitting: *Deleted

## 2022-03-26 ENCOUNTER — Ambulatory Visit (INDEPENDENT_AMBULATORY_CARE_PROVIDER_SITE_OTHER): Payer: Managed Care, Other (non HMO) | Admitting: *Deleted

## 2022-03-26 DIAGNOSIS — Q230 Congenital stenosis of aortic valve: Secondary | ICD-10-CM | POA: Diagnosis not present

## 2022-03-26 DIAGNOSIS — Q231 Congenital insufficiency of aortic valve: Secondary | ICD-10-CM | POA: Diagnosis not present

## 2022-03-26 DIAGNOSIS — Z954 Presence of other heart-valve replacement: Secondary | ICD-10-CM | POA: Diagnosis not present

## 2022-03-26 DIAGNOSIS — Z7901 Long term (current) use of anticoagulants: Secondary | ICD-10-CM | POA: Diagnosis not present

## 2022-03-26 LAB — POCT INR: INR: 1.8 — AB (ref 2.0–3.0)

## 2022-03-26 NOTE — Telephone Encounter (Signed)
Spoke with the patient and scheduled a new patient with Dr Berline Lopes on *5/22 at 10:30 am. Patient given an arrival time of 10 am. Patient also given the address and phone number for the clinic; along with the policy for mask and visitors.   ? ?Patient requested a ASL interpreter. Called interpreter service and will need to use the video interpreter ?

## 2022-03-26 NOTE — Telephone Encounter (Signed)
Spoke with the patient and scheduled her for a new patient appt with Dr Berline Lopes on 5/22 at 10:30am; patient given the arrival time of 10 am. Patient given the address and phone number for the clinic; along with the policy for mask and visitors  ?

## 2022-03-26 NOTE — Telephone Encounter (Signed)
Patient scheduled on 04/20/22 with Dr.Tucker.  ?

## 2022-03-26 NOTE — Patient Instructions (Signed)
Description   ?Today take 2 tablets then continue taking warfarin 1 tablet daily except for 1.5 tablets on Sundays and Thursdays. Recheck INR in 1 week. [facility] [phone] ? ? ?  ?  ?

## 2022-04-02 ENCOUNTER — Ambulatory Visit (INDEPENDENT_AMBULATORY_CARE_PROVIDER_SITE_OTHER): Payer: Managed Care, Other (non HMO)

## 2022-04-02 DIAGNOSIS — Z954 Presence of other heart-valve replacement: Secondary | ICD-10-CM | POA: Diagnosis not present

## 2022-04-02 DIAGNOSIS — Z7901 Long term (current) use of anticoagulants: Secondary | ICD-10-CM | POA: Diagnosis not present

## 2022-04-02 LAB — POCT INR: INR: 2.2 (ref 2.0–3.0)

## 2022-04-02 NOTE — Patient Instructions (Signed)
Description   ?Continue taking warfarin 1 tablet daily except for 1.5 tablets on Sundays and Thursdays.  ?Recheck INR in 2 weeks.  ?Coumadin Clinic [phone] ? ? ?  ?   ?

## 2022-04-07 ENCOUNTER — Other Ambulatory Visit: Payer: Self-pay | Admitting: Family Medicine

## 2022-04-09 ENCOUNTER — Ambulatory Visit: Payer: Managed Care, Other (non HMO) | Admitting: Obstetrics and Gynecology

## 2022-04-09 ENCOUNTER — Encounter: Payer: Self-pay | Admitting: Obstetrics and Gynecology

## 2022-04-09 VITALS — BP 142/84 | Ht 59.0 in | Wt 124.0 lb

## 2022-04-09 DIAGNOSIS — F17200 Nicotine dependence, unspecified, uncomplicated: Secondary | ICD-10-CM

## 2022-04-09 DIAGNOSIS — D071 Carcinoma in situ of vulva: Secondary | ICD-10-CM | POA: Diagnosis not present

## 2022-04-09 DIAGNOSIS — N890 Mild vaginal dysplasia: Secondary | ICD-10-CM | POA: Diagnosis not present

## 2022-04-09 DIAGNOSIS — Z23 Encounter for immunization: Secondary | ICD-10-CM

## 2022-04-09 NOTE — Progress Notes (Signed)
GYNECOLOGY  VISIT ?  ?HPI: ?41 y.o.   Single  Caucasian  female   ?G1P0010 with No LMP recorded. Patient has had a hysterectomy.   ?here for 3 week follow up of vulvar and vaginal biopsies.  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  ? ?Has not had Gardasil. ? ?Is smoking and cutting down.  ? ?GYNECOLOGIC HISTORY: ?No LMP recorded. Patient has had a hysterectomy. ?Contraception:  Hyst ?Menopausal hormone therapy:  none ?Last mammogram:  02-23-19 Diag.Bil/Neg/Birads1--knows to schedule ?Last pap smear:   02-05-22 Neg:Pos HR HPV, 01-12-19 Neg:Pos HR HPV;Neg 16/18/45, 05-17-17 Neg:Pos HR HPV, 09-18-15 LSIL:Pos HR HPV ?       ?OB History   ? ? Gravida  ?1  ? Para  ?   ? Term  ?   ? Preterm  ?   ? AB  ?1  ? Living  ?0  ?  ? ? SAB  ?   ? IAB  ?   ? Ectopic  ?   ? Multiple  ?   ? Live Births  ?   ?   ?  ?  ?    ? ?Patient Active Problem List  ? Diagnosis Date Noted  ? Cyst, dermoid, face 10/06/2021  ? Acute tear lateral meniscus, left, initial encounter 09/09/2021  ? Deviated septum 01/02/2021  ? Degenerative disc disease, cervical 05/21/2020  ? Cervical cancer screening 12/22/2018  ? Long term (current) use of anticoagulants [Z79.01] 11/11/2017  ? 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 Chronic, continuous use of opioids 09/06/2017  ? Tobacco abuse 09/27/2015  ? SI (sacroiliac) joint dysfunction 02/20/2015  ? Leg length discrepancy 02/20/2015  ? Allergic rhinitis, cause unspecified 05/04/2014  ? Aortic stenosis due to bicuspid aortic valve 02/20/2014  ? Bipolar disorder ([place]) 02/05/2014  ? Migraine with status migrainosus 01/27/2013  ? Hyperlipidemia with target LDL less than 130 01/27/2013  ? Routine general medical examination at a health care facility 10/20/2011  ? Mild persistent asthma 04/21/2011  ? Essential hypertension, benign 04/21/2011  ? ? ?Past Medical History:  ?Diagnosis Date  ? Abnormal Pap smear of cervix   ? --recurrent ascus  w/Pos. HR HPV  ? ADD (attention deficit disorder)   ? Alcohol abuse, in remission 2012  ? Allergy   ? Anxiety   ? Aortic stenosis due to bicuspid aortic valve 02/20/2014  ? Arthritis   ? In hips  ? Asthma   ? rare inhaler use  ? Bipolar disorder ([place])   ? Headache   ? Hyperlipidemia with target LDL less than 130 01/27/2013  ? Hypertension   ? Muscle spasms of both lower extremities   ? both hips  ? 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 23 mm Sorin [employer]omed[employer] [place] bileaflet mechanical valve via right anterior mini thoracotomy  ? Tobacco abuse 09/27/2015  ? 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 biopsy and CO2 laser ablation  ? ? ?Past Surgical History:  ?Procedure Laterality Date  ? ABDOMINAL HYSTERECTOMY  02/06/2009  ? Robotic total laparoscopic hysterectomy  ? [place]  ? AORTIC VALVE REPLACEMENT N/A 11/03/2017  ? Procedure: MINIMALLY INVASIVE AORTIC VALVE REPLACEMENT( MINI THORACOTOMY);  Surgeon: Rexene Alberts, MD;  Location: [place];  Service: Open Heart Surgery;  Laterality: N/A;  ? CARDIAC CATHETERIZATION  June 2015  ? no CAD -  moderate AS noted  ? CERVICAL BIOPSY  W/ LOOP ELECTRODE EXCISION  11/2008  ? 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 COLPOSCOPY  10/2008  ? CIN I & II  ? COLPOSCOPY  07/2000  ? Neg. ECC  ? COLPOSCOPY  06/2001  ? CIN I  ? COLPOSCOPY  08/2004  ? ECC--atypia  ? COLPOSCOPY N/A 01/21/2016  ? Procedure: COLPOSCOPY with vaginal biopsy with CO 2 Laser of Vaginal and vulvar condyloma;  Surgeon: [name], MD;  Location: [facility];  Service: Gynecology;  Laterality: N/A;  [name] will be here 2/21 for 1115 case confirmed 01/16/15 - TS  ? HERNIA REPAIR  1981/1982  ? [place]  ? Hip Reset   ? [place]  ? Plate was reconstructed/ took out growth plate in Right knee  ? [place]  ? Plate removed in Left hip  ? LEFT AND RIGHT HEART CATHETERIZATION WITH CORONARY  ANGIOGRAM N/A 05/18/2014  ? Procedure: LEFT AND RIGHT HEART CATHETERIZATION WITH CORONARY ANGIOGRAM;  Surgeon: Jettie Booze, MD;  Location: [facility] CATH LAB;  Service: Cardiovascular;  Laterality: N/A;  ? LYMPH NODE BIOPSY  1995  ? NASAL SEPTUM SURGERY  2002  ? TEE WITHOUT CARDIOVERSION N/A 11/03/2017  ? Procedure: TRANSESOPHAGEAL ECHOCARDIOGRAM (TEE);  Surgeon: Rexene Alberts, MD;  Location: [place];  Service: Open Heart Surgery;  Laterality: N/A;  ? TONSILLECTOMY AND ADENOIDECTOMY  1990  ? TYMPANOSTOMY TUBE PLACEMENT  1981/1982  ? ? ?Current Outpatient Medications  ?Medication Sig Dispense Refill  ? albuterol (VENTOLIN HFA) 108 (90 Base) MCG/ACT inhaler Inhale 2 puffs into the lungs every 6 (six) hours as needed for wheezing or shortness of breath. 1 each 3  ? aspirin EC 81 MG tablet Take 81 mg by mouth daily. Swallow whole.    ? benzonatate (TESSALON) 100 MG capsule Take 1 capsule (100 mg total) by mouth 2 (two) times daily as needed for cough. 20 capsule 0  ? DELSYM 30 MG/5ML liquid Take by mouth.    ? dextromethorphan 15 MG/5ML syrup Take 10 mLs (30 mg total) by mouth 4 (four) times daily as needed for cough. 120 mL 0  ? doxycycline (VIBRA-TABS) 100 MG tablet Take 1 tablet (100 mg total) by mouth 2 (two) times daily. 14 tablet 0  ? DULoxetine (CYMBALTA) 30 MG capsule TAKE ONE CAPSULE BY MOUTH DAILY 30 capsule 3  ? gabapentin (NEURONTIN) 300 MG capsule TAKE TWO CAPSULES BY MOUTH THREE TIMES A DAY 540 capsule 0  ? lamoTRIgine (LAMICTAL) 200 MG tablet TAKE TWO TABLETS BY MOUTH DAILY 180 tablet 1  ? lansoprazole (PREVACID) 15 MG capsule Take 15 mg by mouth daily as needed (for heartburn or acid reflux).     ? LORazepam (ATIVAN) 1 MG tablet Take 1 tablet (1 mg total) by mouth as needed for anxiety (Take 1 tablet by mouth 1 hour prior to procedure.). 2 tablet 0  ? meclizine (ANTIVERT) 12.5 MG tablet TAKE ONE TABLET BY MOUTH TWICE A DAY AS NEEDED FOR DIZZINESS 20 tablet 0  ? metoprolol tartrate (LOPRESSOR) 25 MG  tablet Take 0.5 tablets (12.5 mg total) by mouth daily. 45 tablet 3  ? ondansetron (ZOFRAN) 4 MG tablet Take 1 tablet (4 mg total) by mouth every 8 (eight) hours as needed for nausea or vomiting. 20 tablet 0  ? pravastatin (PRAVACHOL) 20 MG tablet Take 1 tablet (20 mg total) by mouth daily. 90 tablet 1  ? tiZANidine (  ZANAFLEX) 4 MG tablet TAKE ONE TABLET BY MOUTH AT BEDTIME 90 tablet 0  ? traMADol (ULTRAM) 50 MG tablet TAKE TWO TABLETS BY MOUTH TWICE A DAY 120 tablet 0  ? warfarin (COUMADIN) 7.5 MG tablet TAKE 1 TO 1.5 TABLETS BY MOUTH ONCE DAILY AS DIRECTED BY COUMADIN CLINIC 40 tablet 2  ? ?No current facility-administered medications for this visit.  ?  ? ?ALLERGIES: Demerol, Ibuprofen, and Codeine ? ?Family History  ?Problem Relation Age of Onset  ? Hyperlipidemia Mother   ? Hypertension Mother   ? Thyroid disease Mother   ? Hyperlipidemia Father   ? Hypertension Father   ? Migraines Father   ? Hypertension Brother   ? Hyperlipidemia Brother   ? Thyroid disease Maternal Grandmother   ? Thyroid disease Maternal Grandfather   ? Cancer Neg Hx   ? ? ?Social History  ? ?Socioeconomic History  ? Marital status: Single  ?  Spouse name: Not on file  ? Number of children: Not on file  ? Years of education: Not on file  ? Highest education level: Not on file  ?Occupational History  ? Not on file  ?Tobacco Use  ? Smoking status: Light Smoker  ?  Packs/day: 0.50  ?  Years: 12.00  ?  Pack years: 6.00  ?  Types: Cigarettes  ?  Last attempt to quit: 11/02/2017  ?  Years since quitting: 4.4  ? Smokeless tobacco: Never  ? Tobacco comments:  ?  pt given fake cigarette for hand/mouth association. pt using [facility] for support  ?Vaping Use  ? Vaping Use: Never used  ?Substance and Sexual Activity  ? Alcohol use: No  ?  Alcohol/week: 0.0 standard drinks  ? Drug use: No  ? Sexual activity: Yes  ?  Partners: Male  ?  Birth control/protection: Surgical  ?  Comment: Hysterectomy  ?Other Topics Concern  ? Not on file  ?Social  History Narrative  ? Not on file  ? ?Social Determinants of Health  ? ?Financial Resource Strain: Not on file  ?Food Insecurity: Not on file  ?Transportation Needs: Not on file  ?Physical Activity: Not on file  ?Str

## 2022-04-10 ENCOUNTER — Encounter: Payer: Self-pay | Admitting: Gynecologic Oncology

## 2022-04-10 ENCOUNTER — Other Ambulatory Visit: Payer: Self-pay | Admitting: Internal Medicine

## 2022-04-10 DIAGNOSIS — F411 Generalized anxiety disorder: Secondary | ICD-10-CM

## 2022-04-10 DIAGNOSIS — F3177 Bipolar disorder, in partial remission, most recent episode mixed: Secondary | ICD-10-CM

## 2022-04-12 ENCOUNTER — Telehealth: Payer: Self-pay | Admitting: Family Medicine

## 2022-04-16 ENCOUNTER — Ambulatory Visit (INDEPENDENT_AMBULATORY_CARE_PROVIDER_SITE_OTHER): Payer: Managed Care, Other (non HMO) | Admitting: *Deleted

## 2022-04-16 DIAGNOSIS — Q230 Congenital stenosis of aortic valve: Secondary | ICD-10-CM | POA: Diagnosis not present

## 2022-04-16 DIAGNOSIS — Z7901 Long term (current) use of anticoagulants: Secondary | ICD-10-CM

## 2022-04-16 DIAGNOSIS — Q231 Congenital insufficiency of aortic valve: Secondary | ICD-10-CM | POA: Diagnosis not present

## 2022-04-16 DIAGNOSIS — Z954 Presence of other heart-valve replacement: Secondary | ICD-10-CM | POA: Diagnosis not present

## 2022-04-16 LAB — POCT INR: INR: 2.3 (ref 2.0–3.0)

## 2022-04-16 NOTE — Patient Instructions (Signed)
Description   Continue taking warfarin 1 tablet daily except for 1.5 tablets on Sundays and Thursdays.  Recheck INR in 3 weeks. Coumadin Clinic [phone]

## 2022-04-20 ENCOUNTER — Telehealth: Payer: Self-pay

## 2022-04-20 ENCOUNTER — Encounter: Payer: Self-pay | Admitting: Gynecologic Oncology

## 2022-04-20 ENCOUNTER — Inpatient Hospital Stay (HOSPITAL_BASED_OUTPATIENT_CLINIC_OR_DEPARTMENT_OTHER): Payer: Managed Care, Other (non HMO) | Admitting: Gynecologic Oncology

## 2022-04-20 ENCOUNTER — Inpatient Hospital Stay: Payer: Managed Care, Other (non HMO) | Attending: Gynecologic Oncology | Admitting: Gynecologic Oncology

## 2022-04-20 ENCOUNTER — Other Ambulatory Visit: Payer: Self-pay

## 2022-04-20 VITALS — BP 147/95 | HR 69 | Temp 98.2°F | Resp 16 | Ht 59.0 in | Wt 119.0 lb

## 2022-04-20 DIAGNOSIS — E785 Hyperlipidemia, unspecified: Secondary | ICD-10-CM | POA: Diagnosis not present

## 2022-04-20 DIAGNOSIS — J45909 Unspecified asthma, uncomplicated: Secondary | ICD-10-CM | POA: Insufficient documentation

## 2022-04-20 DIAGNOSIS — I350 Nonrheumatic aortic (valve) stenosis: Secondary | ICD-10-CM | POA: Diagnosis not present

## 2022-04-20 DIAGNOSIS — Z87410 Personal history of cervical dysplasia: Secondary | ICD-10-CM | POA: Insufficient documentation

## 2022-04-20 DIAGNOSIS — Z90710 Acquired absence of both cervix and uterus: Secondary | ICD-10-CM | POA: Insufficient documentation

## 2022-04-20 DIAGNOSIS — F988 Other specified behavioral and emotional disorders with onset usually occurring in childhood and adolescence: Secondary | ICD-10-CM | POA: Insufficient documentation

## 2022-04-20 DIAGNOSIS — Z7982 Long term (current) use of aspirin: Secondary | ICD-10-CM | POA: Insufficient documentation

## 2022-04-20 DIAGNOSIS — Z952 Presence of prosthetic heart valve: Secondary | ICD-10-CM | POA: Insufficient documentation

## 2022-04-20 DIAGNOSIS — R220 Localized swelling, mass and lump, head: Secondary | ICD-10-CM | POA: Insufficient documentation

## 2022-04-20 DIAGNOSIS — F319 Bipolar disorder, unspecified: Secondary | ICD-10-CM | POA: Diagnosis not present

## 2022-04-20 DIAGNOSIS — F1011 Alcohol abuse, in remission: Secondary | ICD-10-CM | POA: Diagnosis not present

## 2022-04-20 DIAGNOSIS — D071 Carcinoma in situ of vulva: Secondary | ICD-10-CM

## 2022-04-20 DIAGNOSIS — F419 Anxiety disorder, unspecified: Secondary | ICD-10-CM | POA: Insufficient documentation

## 2022-04-20 DIAGNOSIS — I10 Essential (primary) hypertension: Secondary | ICD-10-CM | POA: Insufficient documentation

## 2022-04-20 DIAGNOSIS — Z79899 Other long term (current) drug therapy: Secondary | ICD-10-CM | POA: Insufficient documentation

## 2022-04-20 DIAGNOSIS — Z7901 Long term (current) use of anticoagulants: Secondary | ICD-10-CM | POA: Diagnosis not present

## 2022-04-20 DIAGNOSIS — F17210 Nicotine dependence, cigarettes, uncomplicated: Secondary | ICD-10-CM | POA: Insufficient documentation

## 2022-04-20 NOTE — H&P (View-Only) (Signed)
GYNECOLOGIC ONCOLOGY NEW PATIENT CONSULTATION   Patient Name: Danielle Obrien  Patient Age: 41 y.o. Date of Service: 04/20/22 Referring Provider: Josefa Half, MD  Primary Care Provider: Janith Lima, MD Consulting Provider: Jeral Pinch, MD   Assessment/Plan:  Premenopausal patient with long history of cervical, vaginal, and vulvar dysplasia now presenting with multifocal high-grade vulvar dysplasia.  I spent some time today discussing patient's history of dysplasia and reviewing prior treatment.  Most recent biopsies from last month show low-grade dysplasia of the vagina and high-grade dysplasia, multifocal, of the vulva.  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  From a treatment standpoint, I reviewed that low-grade dysplasia is observed given low risk of progression to cancer whereas treatment is recommended for high-grade dysplasia given increased risk of progression to cancer.  We discussed tobacco use as a cofactor to HPV and the development of cervical/vulvar dysplasia and malignancy.  Patient has successfully been able to decrease her tobacco use and I gave encouragement for cessation.  Patient has spoken with her OB/GYN about HPV vaccination.  She has initiated the Gardasil series.  I agree with this recommendation although we discussed that this will not help protect the patient against strains of HPV to which she has already been exposed.  With regard to her exam, the most concerning appearing lesion is a 1 along the left upper labia.  I recommend that this be excised, which I think can be performed without disruption of the clitoris or clitoral hood.  The posterior labial and perineal lesions are small and amenable to laser ablation.  The right mons lesion is somewhat atypical in appearance for dysplasia.  I recommend additional biopsies as well as laser ablation.  Given her history, I will also plan for complete  examination of the vagina with any indicated biopsies.  With regard to surgery, we discussed risks include but are not limited to of bleeding, need for blood transfusion, infection, damage to surrounding structures, risk of anesthesia, DVT, need for additional procedures, and rarely death.  Perioperative patient was performed today.  Given enlarging mandible mass, will reach out to plastic surgery about evaluation of excision at the time of surgery with me given need for interruption of Coumadin.  Patient notes increasing size of this lesion, and she is interested in having it excised.  I will have my office reach out to the patient's cardiologist regarding recommendations for interruption of her anticoagulation in the setting of her mechanical heart valve.  A copy of this note was sent to the patient's referring provider.   55 minutes of total time was spent for this patient encounter, including preparation, face-to-face counseling with the patient and coordination of care, and documentation of the encounter.  Jeral Pinch, MD  Division of Gynecologic Oncology  Department of Obstetrics and Gynecology  [facility]  ___________________________________________  Chief Complaint: Chief Complaint  Patient presents with   VIN III (vulvar intraepithelial neoplasia III)    History of Present Illness:  Danielle Obrien is a 41 y.o. y.o. female who is seen in consultation at the request of Dr. Quincy Simmonds for an evaluation of vulvar dysplasia.  Dysplasia history:  03/19/22: Vaginal cuff biopsy - VAIN1; left buttocks biopsy - VIN3, Perineal biopsy - VIN 2-3, mons biopsy - VIN 2-3. 01/2022 Vaginal pap - negative, HR HPV positive 12/2018: Pap - negative, HR HPV positive (negative for 16, 18 and 45) 05/2017: vaginal biopsies -  one with benign squamous mucosa, other with VAIN I 04/2017: Vaginal pap - negative, HR HPV not detected. 01/2016:  Colposcopy of the vagina with vaginal  biopsies, CO2 laser ablation of vaginal dysplasia and vulvar condyloma. Vaginal biopsies, one with VAIN 2, other VAIN 1. 10/2015: vaginal cuff biopsy: VAIN II.  Vulvar biopsies performed including mons and right labia, both showing condyloma. 08/2015: Vaginal pap - LSIL, HR HPV positive. 02/2014: Vaginal cuff biopsy showed condylomata acuminata, p16 negative for diffuse positive staining.  No malignancy or high-grade dysplasia.  Right labial biopsy shows condyloma. 01/2014: Vaginal pap - LSIL.  Reactive squamous cells present. HR HPV positive. 01/2009: Pathology from hysterectomy shows chronic mild inflammation of the cervix and endocervix, mesonephric hyperplasia.  No evidence of malignancy.  Secretory type endometrium. 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  Endo and ectocervical margins negative for mucosa but partially denuded.  Stromal invasion noted. 10/2008: Cervical biopsy at 6:00 shows HPV effect consistent with low-grade dysplasia.  ECC with 1 fragment of ectocervical/squamous metaplasia with low-grade intraepithelial lesion and focal changes consistent with CIN-2. 07/2020: ECC shows benign endocervix, no dysplasia. 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  Cervical biopsy at 1:00 showed squamous mucosa with koilocytic atypia, no dysplasia identified.  ECC shows rare detached fragments of slightly dysplastic squamous mucosa with benign endocervical mucosa.  Patient has a long history of cervical, vaginal, and vulvar dysplasia as noted above.  She denies any symptoms recently.  Has had some intermittent yellow discharge since starting warfarin, denies any recent change.  Denies any vaginal bleeding.  Denies any vaginal or vulvar pain or pruritus.  She endorses regular bowel function.  Denies any bladder symptoms.  Reports appetite is "so-so".  She continues to report tobacco use although has been able to decrease to 9  cigarettes/day.  Uses THC for pain.  She had a cystic lesion previously removed from her right neck.  Has a enlarging mass along the posterior aspect of her right jaw.  PAST MEDICAL HISTORY:  Past Medical History:  Diagnosis Date   Abnormal Pap smear of cervix    --recurrent ascus w/Pos. 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   23 mm Sorin [employer] [place] bileaflet mechanical valve via right anterior mini thoracotomy   Tobacco abuse 09/27/2015   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   biopsy and CO2 laser ablation     PAST SURGICAL HISTORY:  Past Surgical History:  Procedure Laterality Date   ABDOMINAL HYSTERECTOMY  02/06/2009   Robotic total laparoscopic hysterectomy   ANTERIOR CRUCIATE LIGAMENT REPAIR  12/01/1991   AORTIC VALVE REPLACEMENT N/A 11/03/2017   Procedure: MINIMALLY INVASIVE AORTIC VALVE REPLACEMENT( MINI THORACOTOMY);  Surgeon: Rexene Alberts, MD;  Location: [place];  Service: Open Heart Surgery;  Laterality: N/A;   CARDIAC CATHETERIZATION  04/30/2014   no CAD - moderate AS  noted   CERVICAL BIOPSY  W/ LOOP ELECTRODE EXCISION  11/30/2008   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 ECC   COLPOSCOPY  06/30/2001   CIN I   COLPOSCOPY  08/30/2004   ECC--atypia   COLPOSCOPY N/A 01/21/2016   Procedure: COLPOSCOPY with vaginal biopsy with CO 2 Laser of Vaginal and vulvar condyloma;  Surgeon: [name], MD;  Location:  [facility];  Service: Gynecology;  Laterality: N/A;  [name] will be here 2/21 for 1115 case confirmed 01/16/15 - TS   HERNIA REPAIR Bilateral 1981/1982   inguinal   HIP SURGERY  1981   Hip Reset    HIP SURGERY  11/30/1988   Plate was reconstructed/ took out growth plate in Right knee   HIP SURGERY  11/30/1990   Plate removed in Left hip   LEFT AND RIGHT HEART CATHETERIZATION WITH CORONARY ANGIOGRAM N/A 05/18/2014   Procedure: LEFT AND RIGHT HEART CATHETERIZATION WITH CORONARY ANGIOGRAM;  Surgeon: Jettie Booze, MD;  Location: [facility];  Service: Cardiovascular;  Laterality: N/A;   LYMPH NODE BIOPSY  11/30/1993   NASAL SEPTUM SURGERY  11/30/2000   TEE WITHOUT CARDIOVERSION N/A 11/03/2017   Procedure: TRANSESOPHAGEAL ECHOCARDIOGRAM (TEE);  Surgeon: Rexene Alberts, MD;  Location: [place];  Service: Open Heart Surgery;  Laterality: N/A;   TONSILLECTOMY AND ADENOIDECTOMY  11/30/1988   TYMPANOSTOMY TUBE PLACEMENT  1981/1982    OB/GYN HISTORY:  OB History  Gravida Para Term Preterm AB Living  1       1 0  SAB IAB Ectopic Multiple Live Births               # Outcome Date GA Lbr Len/2nd Weight Sex Delivery Anes PTL Lv  1 AB             No LMP recorded. Patient has had a hysterectomy.  Age at menarche: 42  Age at menopause: n/a, denies menopausal symptoms Hx of HRT: n/a Hx of STDs: HPV Last pap: see HPI History of abnormal pap smears: yes  SCREENING STUDIES:  Last mammogram: 2020  Last colonoscopy: Has never had  MEDICATIONS: Outpatient Encounter Medications as of 04/20/2022  Medication Sig   albuterol (VENTOLIN HFA) 108 (90 Base) MCG/ACT inhaler Inhale 2 puffs into the lungs every 6 (six) hours as needed for wheezing or shortness of breath.   aspirin EC 81 MG tablet Take 81 mg by mouth daily. Swallow whole.   doxycycline (VIBRA-TABS) 100 MG tablet Take 1 tablet (100 mg total) by mouth 2 (two) times daily.   DULoxetine (CYMBALTA) 30 MG capsule TAKE ONE CAPSULE BY MOUTH DAILY    gabapentin (NEURONTIN) 300 MG capsule TAKE TWO CAPSULES BY MOUTH THREE TIMES A DAY   meclizine (ANTIVERT) 12.5 MG tablet TAKE ONE TABLET BY MOUTH TWICE A DAY AS NEEDED FOR DIZZINESS   metoprolol tartrate (LOPRESSOR) 25 MG tablet Take 0.5 tablets (12.5 mg total) by mouth daily.   pravastatin (PRAVACHOL) 20 MG tablet Take 1 tablet (20 mg total) by mouth daily.   tiZANidine (ZANAFLEX) 4 MG tablet TAKE ONE TABLET BY MOUTH AT BEDTIME   traMADol (ULTRAM) 50 MG tablet TAKE TWO TABLETS BY MOUTH TWICE A DAY   warfarin (COUMADIN) 7.5 MG tablet TAKE 1 TO 1.5 TABLETS BY MOUTH ONCE DAILY AS DIRECTED BY COUMADIN CLINIC   [DISCONTINUED] lamoTRIgine (LAMICTAL) 200 MG tablet TAKE TWO TABLETS  BY MOUTH DAILY   benzonatate (TESSALON) 100 MG capsule Take 1 capsule (100 mg total) by mouth 2 (two) times daily as needed for cough. (Patient not taking: Reported on 04/10/2022)   DELSYM 30 MG/5ML liquid Take by mouth. (Patient not taking: Reported on 04/10/2022)   dextromethorphan 15 MG/5ML syrup Take 10 mLs (30 mg total) by mouth 4 (four) times daily as needed for cough. (Patient not taking: Reported on 04/10/2022)   lansoprazole (PREVACID) 15 MG capsule Take 15 mg by mouth daily as needed (for heartburn or acid reflux).  (Patient not taking: Reported on 04/10/2022)   LORazepam (ATIVAN) 1 MG tablet Take 1 tablet (1 mg total) by mouth as needed for anxiety (Take 1 tablet by mouth 1 hour prior to procedure.). (Patient not taking: Reported on 04/10/2022)   ondansetron (ZOFRAN) 4 MG tablet Take 1 tablet (4 mg total) by mouth every 8 (eight) hours as needed for nausea or vomiting. (Patient not taking: Reported on 04/10/2022)   No facility-administered encounter medications on file as of 04/20/2022.    ALLERGIES:  Allergies  Allergen Reactions   Demerol Nausea And Vomiting   Ibuprofen Other (See Comments)    Gastritis    Codeine Itching     FAMILY HISTORY:  Family History  Problem Relation Age of Onset   Hyperlipidemia  Mother    Hypertension Mother    Thyroid disease Mother    Hyperlipidemia Father    Hypertension Father    Migraines Father    Hypertension Brother    Hyperlipidemia Brother    Thyroid disease Maternal Grandmother    Thyroid disease Maternal Grandfather    Prostate cancer Maternal Grandfather    Colon cancer Paternal Great-grandmother      SOCIAL HISTORY:  Social Connections: Not on file    REVIEW OF SYSTEMS:  + Anxiety, easy bruising and bleeding Denies appetite changes, fevers, chills, fatigue, unexplained weight changes. Denies hearing loss, neck lumps or masses, mouth sores, ringing in ears or voice changes. Denies cough or wheezing.  Denies shortness of breath. Denies chest pain or palpitations. Denies leg swelling. Denies abdominal distention, pain, blood in stools, constipation, diarrhea, nausea, vomiting, or early satiety. Denies pain with intercourse, dysuria, frequency, hematuria or incontinence. Denies hot flashes, pelvic pain, vaginal bleeding or vaginal discharge.   Denies joint pain, back pain or muscle pain/cramps. Denies itching, rash, or wounds. Denies dizziness, headaches, numbness or seizures. Denies swollen lymph nodes or glands. Denies  depression, confusion, or decreased concentration.  Physical Exam:  Vital Signs for this encounter:  Blood pressure (!) 147/95, pulse 69, temperature 98.2 F (36.8 C), temperature source Oral, resp. rate 16, height '4\' 11"'$  (1.499 m), weight 119 lb (54 kg), SpO2 100 %. Body mass index is 24.04 kg/m. General: Alert, oriented, no acute distress.  HEENT: Normocephalic, atraumatic.  Approximately 1.5 cm mobile mass along the right mandible.  Nontender, smooth.  Sclera anicteric.  Chest: Clear to auscultation bilaterally. No wheezes, rhonchi, or rales. Cardiovascular: Regular rate and rhythm, mechanical heart sound.  Abdomen: Normoactive bowel sounds. Soft, nondistended, nontender to palpation. No masses or hepatosplenomegaly  appreciated. No palpable fluid wave.  Extremities: Grossly normal range of motion. Warm, well perfused. No edema bilaterally.  Skin: No rashes or lesions.  Lymphatics: No cervical, supraclavicular, or inguinal adenopathy.  GU: External genitalia notable for a 1.5 x 2 cm slightly raised area of leukoplakia along the left upper labia lateral to the clitoris, with very small area of ulceration noted in the middle.  There are several less than 1 cm areas of condyloma along the posterior vulva and perineum, 2 of which have healing biopsy sites.  There is an approximately 4 x 4 centimeter area that appears most consistent with healing scab along the right mons.  Healing biopsy site also noted here.  LABORATORY AND RADIOLOGIC DATA:  Outside medical records were reviewed to synthesize the above history, along with the history and physical obtained during the visit.   Lab Results  Component Value Date   WBC 5.2 01/02/2021   HGB 14.1 01/02/2021   HCT 42.2 01/02/2021   PLT 151.0 01/02/2021   GLUCOSE 86 01/29/2022   CHOL 270 (H) 01/29/2022   TRIG 55.0 01/29/2022   HDL 62.00 01/29/2022   LDLDIRECT 171.0 01/27/2013   LDLCALC 197 (H) 01/29/2022   ALT 10 01/29/2022   AST 19 01/29/2022   NA 138 01/29/2022   K 3.9 01/29/2022   CL 104 01/29/2022   CREATININE 0.76 01/29/2022   BUN 8 01/29/2022   CO2 26 01/29/2022   TSH 0.80 01/02/2021   INR 2.3 04/16/2022   HGBA1C 5.3 12/22/2018

## 2022-04-20 NOTE — Telephone Encounter (Signed)
Called Dr. Clent Demark office, left message inquiring if patient has a pain contract with him. Our office will be scheduling the patient for surgery and we want to ensure patient has adequate pain coverage during the perioperative period. Instructed to call our back at [phone].

## 2022-04-20 NOTE — Patient Instructions (Signed)
We are going to work on placing a referral to meet with a surgeon for evaluation of the right jaw lump. If surgery is recommended, we will see if they want to proceed with a joint surgery with [place].   Preparing for your Surgery  Plan for surgery with Dr. Jeral Pinch at [facility] (date and location subject to change based on need for joint surgery or not). You will be scheduled for vulvar laser, vulvar excision, possible vulvar biopsy, possible vaginal biopsy and laser.   Pre-operative Testing -You will receive a phone call from presurgical testing at [facility] to discuss surgery instructions and arrange for lab work if needed.  -Bring your insurance card, copy of an advanced directive if applicable, medication list.  -You should not be taking blood thinners or aspirin at least ten days prior to surgery unless instructed by your surgeon.  -Do not take supplements such as fish oil (omega 3), red yeast rice, turmeric before your surgery. You want to avoid medications with aspirin in them including headache powders such as BC or Goody's), Excedrin migraine.  Day Before Surgery at [place] will be advised you can have clear liquids up until 3 hours before your surgery.    Your role in recovery Your role is to become active as soon as directed by your doctor, while still giving yourself time to heal.  Rest when you feel tired. You will be asked to do the following in order to speed your recovery:  - Cough and breathe deeply. This helps to clear and expand your lungs and can prevent pneumonia after surgery.  - [place]. Do mild physical activity. Walking or moving your legs help your circulation and body functions return to normal. Do not try to get up or walk alone the first time after surgery.   -If you develop swelling on one leg or the other, pain in the back of your leg, redness/warmth in one of your legs, please call the  office or go to the Emergency Room to have a doppler to rule out a blood clot. For shortness of breath, chest pain-seek care in the Emergency Room as soon as possible. - Actively manage your pain. Managing your pain lets you move in comfort. We will ask you to rate your pain on a scale of zero to 10. It is your responsibility to tell your doctor or nurse where and how much you hurt so your pain can be treated.  Special Considerations -Your final pathology results from surgery should be available around one week after surgery and the results will be relayed to you when available.  -FMLA forms can be faxed to [phone] and please allow 5-7 business days for completion.  Pain Management After Surgery -You will be prescribed your pain medication and bowel regimen medications before surgery so that you can have these available when you are discharged from the hospital. The pain medication is for use ONLY AFTER surgery and a new prescription will not be given.   -Make sure that you have Tylenol and Ibuprofen at home to use on a regular basis after surgery for pain control. We recommend alternating the medications every hour to six hours since they work differently and are processed in the body differently for pain relief.  -Review the attached handout on narcotic use and their risks and side effects.   Bowel Regimen -You will be prescribed Sennakot-S to take nightly to prevent constipation  especially if you are taking the narcotic pain medication intermittently.  It is important to prevent constipation and drink adequate amounts of liquids. You can stop taking this medication when you are not taking pain medication and you are back on your normal bowel routine.  Risks of Surgery Risks of surgery are low but include bleeding, infection, damage to surrounding structures, re-operation, blood clots, and very rarely death.  AFTER SURGERY INSTRUCTIONS  Return to work:  2-4 weeks if  applicable  Activity: 1. Be up and out of the bed during the day.  Take a nap if needed.  You may walk up steps but be careful and use the hand rail.  Stair climbing will tire you more than you think, you may need to stop part way and rest.   2. No lifting or straining for 4 weeks over 10 pounds. No pushing, pulling, straining for 4 weeks.  3. No driving for minimum 24 hours after surgery.  Do not drive if you are taking narcotic pain medicine and make sure that your reaction time has returned.   4. You can shower as soon as the next day after surgery. Shower daily. No tub baths or submerging your body in water until cleared by your surgeon. If you have the soap that was given to you by pre-surgical testing that was used before surgery, you do not need to use it afterwards because this can irritate your incisions.   5. No sexual activity and nothing in the vagina for 4 weeks minimum.  6. You may experience vulvar/ vaginal spotting and discharge after surgery.  The spotting is normal but if you experience heavy bleeding, call our office.  7. Take Tylenol or ibuprofen first for pain if you are able to take these medications and only use narcotic pain medication for severe pain not relieved by the Tylenol or Ibuprofen.  Monitor your Tylenol intake to a max of 4,000 mg in a 24 hour period. You can alternate these medications after surgery.  Diet: 1. Low sodium Heart Healthy Diet is recommended but you are cleared to resume your normal (before surgery) diet after your procedure.  2. It is safe to use a laxative, such as Miralax or Colace, if you have difficulty moving your bowels. You will be prescribed Sennakot at bedtime every evening to keep bowel movements regular and to prevent constipation.    Wound Care: 1. Keep clean and dry.  Shower daily.  Reasons to call the Doctor: Fever - Oral temperature greater than 100.4 degrees Fahrenheit Foul-smelling vaginal discharge Difficulty  urinating Nausea and vomiting Increased pain at the site of the incision that is unrelieved with pain medicine. Difficulty breathing with or without chest pain New calf pain especially if only on one side Sudden, continuing increased vaginal bleeding with or without clots.   Contacts: For questions or concerns you should contact:  Dr. Jeral Pinch at [phone]  [name], NP at [phone]  After Hours: call [phone] and have the GYN Oncologist paged/contacted (after 5 pm or on the weekends).  Messages sent via mychart are for non-urgent matters and are not responded to after hours so for urgent needs, please call the after hours number.

## 2022-04-20 NOTE — Telephone Encounter (Signed)
Spoke with Danielle Obrien from [facility] office who stated that we can prescribe tramadol pain medication for pt and if pt needs additional pain medication Dr.Smith will manage it. [name], NP notified.

## 2022-04-20 NOTE — Telephone Encounter (Signed)
I appreciate them checking.  I am fine if they do need to prescribe something stronger or more during the surgery and then going back to our prescription afterwards.

## 2022-04-20 NOTE — Telephone Encounter (Signed)
Spoke with cancer center per Dr. Thompson Caul recommendations.

## 2022-04-20 NOTE — Telephone Encounter (Signed)
Cancer center with Dr. Berline Lopes called and patient will be getting surgery soon and they are wanting to make sure she has a pain contract with [place] for the tramadol so she has pain meds for after the surgery. They prescribe this after surgery but see she is already on it by Dr. Tamala Julian. If he wants something else for patient or if he would like them to prescribe the medication or if our office is going to take care of this they would like to know.  Call back   [place] [phone]

## 2022-04-20 NOTE — Progress Notes (Unsigned)
GYNECOLOGIC ONCOLOGY NEW PATIENT CONSULTATION   Patient Name: Danielle Obrien  Patient Age: 41 y.o. Date of Service: 04/20/22 Referring Provider: Josefa Half, MD  Primary Care Provider: Janith Lima, MD Consulting Provider: Jeral Pinch, MD   Assessment/Plan:  Premenopausal patient with long history of cervical, vaginal, and vulvar dysplasia now presenting with multifocal high-grade vulvar dysplasia.  I spent some time today discussing patient's history of dysplasia and reviewing prior treatment.  Most recent biopsies from last month show low-grade dysplasia of the vagina and high-grade dysplasia, multifocal, of the vulva.  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  From a treatment standpoint, I reviewed that low-grade dysplasia is observed given low risk of progression to cancer whereas treatment is recommended for high-grade dysplasia given increased risk of progression to cancer.  We discussed tobacco use as a cofactor to HPV and the development of cervical/vulvar dysplasia and malignancy.  Patient has successfully been able to decrease her tobacco use and I gave encouragement for cessation.  Patient has spoken with her OB/GYN about HPV vaccination.  She has initiated the Gardasil series.  I agree with this recommendation although we discussed that this will not help protect the patient against strains of HPV to which she has already been exposed.  With regard to her exam, the most concerning appearing lesion is a 1 along the left upper labia.  I recommend that this be excised, which I think can be performed without disruption of the clitoris or clitoral hood.  The posterior labial and perineal lesions are small and amenable to laser ablation.  The right mons lesion is somewhat atypical in appearance for dysplasia.  I recommend additional biopsies as well as laser ablation.  Given her history, I will also plan for complete  examination of the vagina with any indicated biopsies.  With regard to surgery, we discussed risks include but are not limited to of bleeding, need for blood transfusion, infection, damage to surrounding structures, risk of anesthesia, DVT, need for additional procedures, and rarely death.  Perioperative patient was performed today.  Given enlarging mandible mass, will reach out to plastic surgery about evaluation of excision at the time of surgery with me given need for interruption of Coumadin.  Patient notes increasing size of this lesion, and she is interested in having it excised.  I will have my office reach out to the patient's cardiologist regarding recommendations for interruption of her anticoagulation in the setting of her mechanical heart valve.  A copy of this note was sent to the patient's referring provider.   55 minutes of total time was spent for this patient encounter, including preparation, face-to-face counseling with the patient and coordination of care, and documentation of the encounter.  Danielle Pinch, MD  Division of Gynecologic Oncology  Department of Obstetrics and Gynecology  [facility]  ___________________________________________  Chief Complaint: Chief Complaint  Patient presents with   VIN III (vulvar intraepithelial neoplasia III)    History of Present Illness:  Danielle Obrien is a 41 y.o. y.o. female who is seen in consultation at the request of Dr. Quincy Obrien for an evaluation of vulvar dysplasia.  Dysplasia history:  03/19/22: Vaginal cuff biopsy - VAIN1; left buttocks biopsy - VIN3, Perineal biopsy - VIN 2-3, mons biopsy - VIN 2-3. 01/2022 Vaginal pap - negative, HR HPV positive 12/2018: Pap - negative, HR HPV positive (negative for 16, 18 and 45) 05/2017: vaginal biopsies -  one with benign squamous mucosa, other with VAIN I 04/2017: Vaginal pap - negative, HR HPV not detected. 01/2016:  Colposcopy of the vagina with vaginal  biopsies, CO2 laser ablation of vaginal dysplasia and vulvar condyloma. Vaginal biopsies, one with VAIN 2, other VAIN 1. 10/2015: vaginal cuff biopsy: VAIN II.  Vulvar biopsies performed including mons and right labia, both showing condyloma. 08/2015: Vaginal pap - LSIL, HR HPV positive. 02/2014: Vaginal cuff biopsy showed condylomata acuminata, p16 negative for diffuse positive staining.  No malignancy or high-grade dysplasia.  Right labial biopsy shows condyloma. 01/2014: Vaginal pap - LSIL.  Reactive squamous cells present. HR HPV positive. 01/2009: Pathology from hysterectomy shows chronic mild inflammation of the cervix and endocervix, mesonephric hyperplasia.  No evidence of malignancy.  Secretory type endometrium. 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  Endo and ectocervical margins negative for mucosa but partially denuded.  Stromal invasion noted. 10/2008: Cervical biopsy at 6:00 shows HPV effect consistent with low-grade dysplasia.  ECC with 1 fragment of ectocervical/squamous metaplasia with low-grade intraepithelial lesion and focal changes consistent with CIN-2. 07/2020: ECC shows benign endocervix, no dysplasia. 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  Cervical biopsy at 1:00 showed squamous mucosa with koilocytic atypia, no dysplasia identified.  ECC shows rare detached fragments of slightly dysplastic squamous mucosa with benign endocervical mucosa.  Patient has a long history of cervical, vaginal, and vulvar dysplasia as noted above.  She denies any symptoms recently.  Has had some intermittent yellow discharge since starting warfarin, denies any recent change.  Denies any vaginal bleeding.  Denies any vaginal or vulvar pain or pruritus.  She endorses regular bowel function.  Denies any bladder symptoms.  Reports appetite is "so-so".  She continues to report tobacco use although has been able to decrease to 9  cigarettes/day.  Uses THC for pain.  She had a cystic lesion previously removed from her right neck.  Has a enlarging mass along the posterior aspect of her right jaw.  PAST MEDICAL HISTORY:  Past Medical History:  Diagnosis Date   Abnormal Pap smear of cervix    --recurrent ascus w/Pos. 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   23 mm Sorin Carbomedics [place] bileaflet mechanical valve via right anterior mini thoracotomy   Tobacco abuse 09/27/2015   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   biopsy and CO2 laser ablation     PAST SURGICAL HISTORY:  Past Surgical History:  Procedure Laterality Date   ABDOMINAL HYSTERECTOMY  02/06/2009   Robotic total laparoscopic hysterectomy   ANTERIOR CRUCIATE LIGAMENT REPAIR  12/01/1991   AORTIC VALVE REPLACEMENT N/A 11/03/2017   Procedure: MINIMALLY INVASIVE AORTIC VALVE REPLACEMENT( MINI THORACOTOMY);  Surgeon: Rexene Alberts, MD;  Location: [place];  Service: Open Heart Surgery;  Laterality: N/A;   CARDIAC CATHETERIZATION  04/30/2014   no CAD - moderate AS  noted   CERVICAL BIOPSY  W/ LOOP ELECTRODE EXCISION  11/30/2008   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 ECC   COLPOSCOPY  06/30/2001   CIN I   COLPOSCOPY  08/30/2004   ECC--atypia   COLPOSCOPY N/A 01/21/2016   Procedure: COLPOSCOPY with vaginal biopsy with CO 2 Laser of Vaginal and vulvar condyloma;  Surgeon: [name], MD;  Location:  [facility];  Service: Gynecology;  Laterality: N/A;  [name] will be here 2/21 for 1115 case confirmed 01/16/15 - TS   HERNIA REPAIR Bilateral 1981/1982   inguinal   HIP SURGERY  1981   Hip Reset    HIP SURGERY  11/30/1988   Plate was reconstructed/ took out growth plate in Right knee   HIP SURGERY  11/30/1990   Plate removed in Left hip   LEFT AND RIGHT HEART CATHETERIZATION WITH CORONARY ANGIOGRAM N/A 05/18/2014   Procedure: LEFT AND RIGHT HEART CATHETERIZATION WITH CORONARY ANGIOGRAM;  Surgeon: Jettie Booze, MD;  [facility];  Service: Cardiovascular;  Laterality: N/A;   LYMPH NODE BIOPSY  11/30/1993   NASAL SEPTUM SURGERY  11/30/2000   TEE WITHOUT CARDIOVERSION N/A 11/03/2017   Procedure: TRANSESOPHAGEAL ECHOCARDIOGRAM (TEE);  Surgeon: Rexene Alberts, MD;  Location: [place];  Service: Open Heart Surgery;  Laterality: N/A;   TONSILLECTOMY AND ADENOIDECTOMY  11/30/1988   TYMPANOSTOMY TUBE PLACEMENT  1981/1982    OB/GYN HISTORY:  OB History  Gravida Para Term Preterm AB Living  1       1 0  SAB IAB Ectopic Multiple Live Births               # Outcome Date GA Lbr Len/2nd Weight Sex Delivery Anes PTL Lv  1 AB             No LMP recorded. Patient has had a hysterectomy.  Age at menarche: 67  Age at menopause: n/a, denies menopausal symptoms Hx of HRT: n/a Hx of STDs: HPV Last pap: see HPI History of abnormal pap smears: yes  SCREENING STUDIES:  Last mammogram: 2020  Last colonoscopy: Has never had  MEDICATIONS: Outpatient Encounter Medications as of 04/20/2022  Medication Sig   albuterol (VENTOLIN HFA) 108 (90 Base) MCG/ACT inhaler Inhale 2 puffs into the lungs every 6 (six) hours as needed for wheezing or shortness of breath.   aspirin EC 81 MG tablet Take 81 mg by mouth daily. Swallow whole.   doxycycline (VIBRA-TABS) 100 MG tablet Take 1 tablet (100 mg total) by mouth 2 (two) times daily.   DULoxetine (CYMBALTA) 30 MG capsule TAKE ONE CAPSULE BY MOUTH DAILY    gabapentin (NEURONTIN) 300 MG capsule TAKE TWO CAPSULES BY MOUTH THREE TIMES A DAY   meclizine (ANTIVERT) 12.5 MG tablet TAKE ONE TABLET BY MOUTH TWICE A DAY AS NEEDED FOR DIZZINESS   metoprolol tartrate (LOPRESSOR) 25 MG tablet Take 0.5 tablets (12.5 mg total) by mouth daily.   pravastatin (PRAVACHOL) 20 MG tablet Take 1 tablet (20 mg total) by mouth daily.   tiZANidine (ZANAFLEX) 4 MG tablet TAKE ONE TABLET BY MOUTH AT BEDTIME   traMADol (ULTRAM) 50 MG tablet TAKE TWO TABLETS BY MOUTH TWICE A DAY   warfarin (COUMADIN) 7.5 MG tablet TAKE 1 TO 1.5 TABLETS BY MOUTH ONCE DAILY AS DIRECTED BY COUMADIN CLINIC   [DISCONTINUED] lamoTRIgine (LAMICTAL) 200 MG tablet TAKE TWO TABLETS  BY MOUTH DAILY   benzonatate (TESSALON) 100 MG capsule Take 1 capsule (100 mg total) by mouth 2 (two) times daily as needed for cough. (Patient not taking: Reported on 04/10/2022)   DELSYM 30 MG/5ML liquid Take by mouth. (Patient not taking: Reported on 04/10/2022)   dextromethorphan 15 MG/5ML syrup Take 10 mLs (30 mg total) by mouth 4 (four) times daily as needed for cough. (Patient not taking: Reported on 04/10/2022)   lansoprazole (PREVACID) 15 MG capsule Take 15 mg by mouth daily as needed (for heartburn or acid reflux).  (Patient not taking: Reported on 04/10/2022)   LORazepam (ATIVAN) 1 MG tablet Take 1 tablet (1 mg total) by mouth as needed for anxiety (Take 1 tablet by mouth 1 hour prior to procedure.). (Patient not taking: Reported on 04/10/2022)   ondansetron (ZOFRAN) 4 MG tablet Take 1 tablet (4 mg total) by mouth every 8 (eight) hours as needed for nausea or vomiting. (Patient not taking: Reported on 04/10/2022)   No facility-administered encounter medications on file as of 04/20/2022.    ALLERGIES:  Allergies  Allergen Reactions   Demerol Nausea And Vomiting   Ibuprofen Other (See Comments)    Gastritis    Codeine Itching     FAMILY HISTORY:  Family History  Problem Relation Age of Onset   Hyperlipidemia  Mother    Hypertension Mother    Thyroid disease Mother    Hyperlipidemia Father    Hypertension Father    Migraines Father    Hypertension Brother    Hyperlipidemia Brother    Thyroid disease Maternal Grandmother    Thyroid disease Maternal Grandfather    Prostate cancer Maternal Grandfather    Colon cancer Paternal Great-grandmother      SOCIAL HISTORY:  Social Connections: Not on file    REVIEW OF SYSTEMS:  + Anxiety, easy bruising and bleeding Denies appetite changes, fevers, chills, fatigue, unexplained weight changes. Denies hearing loss, neck lumps or masses, mouth sores, ringing in ears or voice changes. Denies cough or wheezing.  Denies shortness of breath. Denies chest pain or palpitations. Denies leg swelling. Denies abdominal distention, pain, blood in stools, constipation, diarrhea, nausea, vomiting, or early satiety. Denies pain with intercourse, dysuria, frequency, hematuria or incontinence. Denies hot flashes, pelvic pain, vaginal bleeding or vaginal discharge.   Denies joint pain, back pain or muscle pain/cramps. Denies itching, rash, or wounds. Denies dizziness, headaches, numbness or seizures. Denies swollen lymph nodes or glands. Denies  depression, confusion, or decreased concentration.  Physical Exam:  Vital Signs for this encounter:  Blood pressure (!) 147/95, pulse 69, temperature 98.2 F (36.8 C), temperature source Oral, resp. rate 16, height '4\' 11"'$  (1.499 m), weight 119 lb (54 kg), SpO2 100 %. Body mass index is 24.04 kg/m. General: Alert, oriented, no acute distress.  HEENT: Normocephalic, atraumatic.  Approximately 1.5 cm mobile mass along the right mandible.  Nontender, smooth.  Sclera anicteric.  Chest: Clear to auscultation bilaterally. No wheezes, rhonchi, or rales. Cardiovascular: Regular rate and rhythm, mechanical heart sound.  Abdomen: Normoactive bowel sounds. Soft, nondistended, nontender to palpation. No masses or hepatosplenomegaly  appreciated. No palpable fluid wave.  Extremities: Grossly normal range of motion. Warm, well perfused. No edema bilaterally.  Skin: No rashes or lesions.  Lymphatics: No cervical, supraclavicular, or inguinal adenopathy.  GU: External genitalia notable for a 1.5 x 2 cm slightly raised area of leukoplakia along the left upper labia lateral to the clitoris, with very small area of ulceration noted in the middle.  There are several less than 1 cm areas of condyloma along the posterior vulva and perineum, 2 of which have healing biopsy sites.  There is an approximately 4 x 4 centimeter area that appears most consistent with healing scab along the right mons.  Healing biopsy site also noted here.  LABORATORY AND RADIOLOGIC DATA:  Outside medical records were reviewed to synthesize the above history, along with the history and physical obtained during the visit.   Lab Results  Component Value Date   WBC 5.2 01/02/2021   HGB 14.1 01/02/2021   HCT 42.2 01/02/2021   PLT 151.0 01/02/2021   GLUCOSE 86 01/29/2022   CHOL 270 (H) 01/29/2022   TRIG 55.0 01/29/2022   HDL 62.00 01/29/2022   LDLDIRECT 171.0 01/27/2013   LDLCALC 197 (H) 01/29/2022   ALT 10 01/29/2022   AST 19 01/29/2022   NA 138 01/29/2022   K 3.9 01/29/2022   CL 104 01/29/2022   CREATININE 0.76 01/29/2022   BUN 8 01/29/2022   CO2 26 01/29/2022   TSH 0.80 01/02/2021   INR 2.3 04/16/2022   HGBA1C 5.3 12/22/2018

## 2022-04-21 ENCOUNTER — Other Ambulatory Visit: Payer: Self-pay | Admitting: Family Medicine

## 2022-04-21 ENCOUNTER — Other Ambulatory Visit: Payer: Self-pay

## 2022-04-21 MED ORDER — GABAPENTIN 300 MG PO CAPS: ORAL_CAPSULE | ORAL | 0 refills | Status: DC

## 2022-04-21 NOTE — Telephone Encounter (Signed)
Patient called following up on the refill request for Gabapentin. It was last filled on 01/16/2022 for a 3 month supply.  Will be having surgery in the next 2-3 weeks per the oncologist.  Please advise.

## 2022-04-21 NOTE — Telephone Encounter (Signed)
Rx refilled.

## 2022-04-22 ENCOUNTER — Telehealth: Payer: Self-pay | Admitting: *Deleted

## 2022-04-22 ENCOUNTER — Telehealth: Payer: Self-pay

## 2022-04-22 DIAGNOSIS — Z01818 Encounter for other preprocedural examination: Secondary | ICD-10-CM

## 2022-04-22 NOTE — Telephone Encounter (Signed)
Spoke with pt this afternoon to inform her that we would not be able to provide an in person sign language interpreter for her appt or during her surgery but we can use the sign language interpreter on the computer for her. She stated that would be fine during the office visit but for the surgery she would request an in person because sign language is her first language and she is more comfortable using that. With her past surgery when she comes out of anesthesia she only uses sign language as her language until the anesthesia is completely cleared from her system.  Informed pt during her pre op appointment to let the nurses know that so they can prepare for her in recovery. Pt verbalized understanding and providers made aware.

## 2022-04-22 NOTE — Telephone Encounter (Signed)
   Pre-operative Risk Assessment    Patient Name: DAYLIN EADS  DOB: 02/17/1981 MRN: [id number]      Request for Surgical Clearance    Procedure:   Vulvar laser, Vulvar excision, possible vulvar biopsy, possible vaginal biopsy with laser.  Date of Surgery:  Clearance TBD                                 Surgeon:  Georgiann Mccoy Surgeon's Group or Practice Name:  [facility] Phone number:  [phone] Fax number:  [phone]   Type of Clearance Requested:   - Medical  - Pharmacy:  Hold Warfarin (Coumadin)     Type of Anesthesia:  General    Additional requests/questions:   None  Signed, Destynee Stringfellow   04/22/2022, 4:39 PM

## 2022-04-23 ENCOUNTER — Encounter: Payer: Self-pay | Admitting: Plastic Surgery

## 2022-04-23 ENCOUNTER — Ambulatory Visit: Payer: Managed Care, Other (non HMO) | Admitting: Plastic Surgery

## 2022-04-23 VITALS — BP 146/92 | HR 69 | Ht 59.0 in | Wt 121.0 lb

## 2022-04-23 DIAGNOSIS — L729 Follicular cyst of the skin and subcutaneous tissue, unspecified: Secondary | ICD-10-CM | POA: Diagnosis not present

## 2022-04-23 DIAGNOSIS — Z7901 Long term (current) use of anticoagulants: Secondary | ICD-10-CM | POA: Diagnosis not present

## 2022-04-23 DIAGNOSIS — L723 Sebaceous cyst: Secondary | ICD-10-CM

## 2022-04-23 NOTE — Progress Notes (Signed)
Referring Provider Janith Lima, MD [place],  [place] [id number]   CC:  Chief Complaint  Patient presents with   Advice Only      Danielle Obrien is an 41 y.o. female.  HPI: Patient presents with a cystic lesion on her right cheek.  Its been present for a number of years and getting bigger.  She has had several benign cyst removed before in her neck and other areas.  She is planning to have a GYN surgery done under general anesthetic.  She is on Coumadin for mechanical heart valve.  She would like to have the facial cyst removed at the same time so that she does not have to go on and off her blood thinners on separate occasions.  Allergies  Allergen Reactions   Demerol Nausea And Vomiting   Ibuprofen Other (See Comments)    Gastritis     Outpatient Encounter Medications as of 04/23/2022  Medication Sig   albuterol (VENTOLIN HFA) 108 (90 Base) MCG/ACT inhaler Inhale 2 puffs into the lungs every 6 (six) hours as needed for wheezing or shortness of breath.   aspirin EC 81 MG tablet Take 81 mg by mouth daily. Swallow whole.   benzonatate (TESSALON) 100 MG capsule Take 1 capsule (100 mg total) by mouth 2 (two) times daily as needed for cough. (Patient not taking: Reported on 04/10/2022)   DELSYM 30 MG/5ML liquid Take by mouth. (Patient not taking: Reported on 04/10/2022)   dextromethorphan 15 MG/5ML syrup Take 10 mLs (30 mg total) by mouth 4 (four) times daily as needed for cough. (Patient not taking: Reported on 04/10/2022)   doxycycline (VIBRA-TABS) 100 MG tablet Take 1 tablet (100 mg total) by mouth 2 (two) times daily.   DULoxetine (CYMBALTA) 30 MG capsule TAKE ONE CAPSULE BY MOUTH DAILY   gabapentin (NEURONTIN) 300 MG capsule TAKE TWO CAPSULES BY MOUTH THREE TIMES A DAY   lamoTRIgine (LAMICTAL) 200 MG tablet TAKE TWO TABLETS BY MOUTH DAILY   lansoprazole (PREVACID) 15 MG capsule Take 15 mg by mouth daily as needed (for heartburn or acid reflux).  (Patient not  taking: Reported on 04/10/2022)   LORazepam (ATIVAN) 1 MG tablet Take 1 tablet (1 mg total) by mouth as needed for anxiety (Take 1 tablet by mouth 1 hour prior to procedure.). (Patient not taking: Reported on 04/10/2022)   meclizine (ANTIVERT) 12.5 MG tablet TAKE ONE TABLET BY MOUTH TWICE A DAY AS NEEDED FOR DIZZINESS   metoprolol tartrate (LOPRESSOR) 25 MG tablet Take 0.5 tablets (12.5 mg total) by mouth daily.   ondansetron (ZOFRAN) 4 MG tablet Take 1 tablet (4 mg total) by mouth every 8 (eight) hours as needed for nausea or vomiting. (Patient not taking: Reported on 04/10/2022)   pravastatin (PRAVACHOL) 20 MG tablet Take 1 tablet (20 mg total) by mouth daily.   tiZANidine (ZANAFLEX) 4 MG tablet TAKE ONE TABLET BY MOUTH AT BEDTIME   traMADol (ULTRAM) 50 MG tablet TAKE TWO TABLETS BY MOUTH TWICE A DAY   warfarin (COUMADIN) 7.5 MG tablet TAKE 1 TO 1.5 TABLETS BY MOUTH ONCE DAILY AS DIRECTED BY COUMADIN CLINIC   No facility-administered encounter medications on file as of 04/23/2022.     Past Medical History:  Diagnosis Date   Abnormal Pap smear of cervix    --recurrent ascus w/Pos. 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   23 mm Sorin Carbomedics [place] bileaflet mechanical valve via right anterior mini thoracotomy   Tobacco abuse 09/27/2015   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   biopsy and CO2 laser ablation    Past Surgical History:  Procedure Laterality Date   ABDOMINAL HYSTERECTOMY  02/06/2009   Robotic  total laparoscopic hysterectomy   ANTERIOR CRUCIATE LIGAMENT REPAIR  12/01/1991   AORTIC VALVE REPLACEMENT N/A 11/03/2017   Procedure: MINIMALLY INVASIVE AORTIC VALVE REPLACEMENT( MINI THORACOTOMY);  Surgeon: Rexene Alberts, MD;  Location: [place];  Service: Open Heart Surgery;  Laterality: N/A;   CARDIAC CATHETERIZATION  04/30/2014   no CAD - moderate AS noted   CERVICAL BIOPSY  W/ LOOP ELECTRODE EXCISION  11/30/2008   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 ECC   COLPOSCOPY  06/30/2001   CIN I   COLPOSCOPY  08/30/2004   ECC--atypia   COLPOSCOPY N/A 01/21/2016   Procedure: COLPOSCOPY with vaginal biopsy with CO 2 Laser of Vaginal and vulvar condyloma;  Surgeon: [name], MD;  Location: [facility];  Service: Gynecology;  Laterality: N/A;  [name] will be here 2/21 for 1115 case confirmed 01/16/15 - TS   HERNIA REPAIR Bilateral 1981/1982   inguinal   HIP SURGERY  1981   Hip Reset    HIP SURGERY  11/30/1988   Plate was reconstructed/ took out growth plate in Right knee   HIP SURGERY  11/30/1990   Plate removed in Left hip   LEFT AND RIGHT HEART CATHETERIZATION WITH CORONARY ANGIOGRAM N/A 05/18/2014   Procedure: LEFT AND RIGHT HEART CATHETERIZATION WITH CORONARY ANGIOGRAM;  Surgeon: Jettie Booze, MD;  Location: [facility] CATH LAB;  Service: Cardiovascular;  Laterality: N/A;   LYMPH NODE BIOPSY  11/30/1993   NASAL SEPTUM SURGERY  11/30/2000   TEE WITHOUT CARDIOVERSION N/A 11/03/2017   Procedure: TRANSESOPHAGEAL ECHOCARDIOGRAM (TEE);  Surgeon: Rexene Alberts, MD;  Location: [place];  Service: Open Heart Surgery;  Laterality: N/A;   TONSILLECTOMY AND ADENOIDECTOMY  11/30/1988   TYMPANOSTOMY TUBE PLACEMENT  1981/1982    Family History  Problem Relation Age of Onset   Hyperlipidemia Mother    Hypertension Mother    Thyroid disease Mother    Hyperlipidemia Father    Hypertension Father    Migraines Father     Hypertension Brother    Hyperlipidemia Brother    Thyroid disease Maternal Grandmother    Thyroid disease Maternal Grandfather    Prostate cancer Maternal Grandfather    Colon cancer Paternal Great-grandmother     Social History   Social History Narrative   Not on file     Review of Systems General: Denies fevers, chills, weight loss CV: Denies chest pain, shortness of breath, palpitations  Physical Exam    04/23/2022    1:42 PM 04/20/2022   10:36 AM 04/09/2022    2:38 PM  Vitals with BMI  Height 4'  11" '4\' 11"'$  '4\' 11"'$   Weight 121 lbs 119 lbs 124 lbs  BMI 24.43 73.41 93.79  Systolic 024 097 353  Diastolic 92 95 84  Pulse 69 69     General:  No acute distress,  Alert and oriented, Non-Toxic, Normal speech and affect Examination shows 2 to 3 cm cystic lesion in the right preauricular area.  Looks like a central punctum consistent with a sebaceous cyst.  It is freely mobile with no overlying skin changes.  No surrounding scars.  Assessment/Plan Patient presents with a cystic lesion in the right preauricular area.  I do think it be reasonable to excise this given its chronicity and her symptoms.  We discussed risks include bleeding, infection, damage to surrounding structures and need for additional procedures.  I do think it is fine to do this in conjunction with her GYN procedure and we will plan to coordinate with Dr. Charisse March office.  All of her questions were answered.  Cindra Presume 04/23/2022, 2:05 PM

## 2022-04-23 NOTE — Telephone Encounter (Signed)
Preoperative team, please order a CBC.  Once labs have resulted we will be able to provide recommendations on patient's Coumadin management.  Thank you for your help.  Jossie Ng. Inaya Gillham NP-C    04/23/2022, 2:06 PM [place] [address] Office [phone] Fax [phone]

## 2022-04-23 NOTE — Addendum Note (Signed)
Addended by: Lindon Romp on: 04/23/2022 04:29 PM   Modules accepted: Orders

## 2022-04-23 NOTE — H&P (View-Only) (Signed)
Referring Provider Janith Lima, MD [place],  [place] [address]   CC:  Chief Complaint  Patient presents with   Advice Only      Danielle Obrien is an 41 y.o. female.  HPI: Patient presents with a cystic lesion on her right cheek.  Its been present for a number of years and getting bigger.  She has had several benign cyst removed before in her neck and other areas.  She is planning to have a GYN surgery done under general anesthetic.  She is on Coumadin for mechanical heart valve.  She would like to have the facial cyst removed at the same time so that she does not have to go on and off her blood thinners on separate occasions.  Allergies  Allergen Reactions   Demerol Nausea And Vomiting   Ibuprofen Other (See Comments)    Gastritis     Outpatient Encounter Medications as of 04/23/2022  Medication Sig   albuterol (VENTOLIN HFA) 108 (90 Base) MCG/ACT inhaler Inhale 2 puffs into the lungs every 6 (six) hours as needed for wheezing or shortness of breath.   aspirin EC 81 MG tablet Take 81 mg by mouth daily. Swallow whole.   benzonatate (TESSALON) 100 MG capsule Take 1 capsule (100 mg total) by mouth 2 (two) times daily as needed for cough. (Patient not taking: Reported on 04/10/2022)   DELSYM 30 MG/5ML liquid Take by mouth. (Patient not taking: Reported on 04/10/2022)   dextromethorphan 15 MG/5ML syrup Take 10 mLs (30 mg total) by mouth 4 (four) times daily as needed for cough. (Patient not taking: Reported on 04/10/2022)   doxycycline (VIBRA-TABS) 100 MG tablet Take 1 tablet (100 mg total) by mouth 2 (two) times daily.   DULoxetine (CYMBALTA) 30 MG capsule TAKE ONE CAPSULE BY MOUTH DAILY   gabapentin (NEURONTIN) 300 MG capsule TAKE TWO CAPSULES BY MOUTH THREE TIMES A DAY   lamoTRIgine (LAMICTAL) 200 MG tablet TAKE TWO TABLETS BY MOUTH DAILY   lansoprazole (PREVACID) 15 MG capsule Take 15 mg by mouth daily as needed (for heartburn or acid reflux).  (Patient not  taking: Reported on 04/10/2022)   LORazepam (ATIVAN) 1 MG tablet Take 1 tablet (1 mg total) by mouth as needed for anxiety (Take 1 tablet by mouth 1 hour prior to procedure.). (Patient not taking: Reported on 04/10/2022)   meclizine (ANTIVERT) 12.5 MG tablet TAKE ONE TABLET BY MOUTH TWICE A DAY AS NEEDED FOR DIZZINESS   metoprolol tartrate (LOPRESSOR) 25 MG tablet Take 0.5 tablets (12.5 mg total) by mouth daily.   ondansetron (ZOFRAN) 4 MG tablet Take 1 tablet (4 mg total) by mouth every 8 (eight) hours as needed for nausea or vomiting. (Patient not taking: Reported on 04/10/2022)   pravastatin (PRAVACHOL) 20 MG tablet Take 1 tablet (20 mg total) by mouth daily.   tiZANidine (ZANAFLEX) 4 MG tablet TAKE ONE TABLET BY MOUTH AT BEDTIME   traMADol (ULTRAM) 50 MG tablet TAKE TWO TABLETS BY MOUTH TWICE A DAY   warfarin (COUMADIN) 7.5 MG tablet TAKE 1 TO 1.5 TABLETS BY MOUTH ONCE DAILY AS DIRECTED BY COUMADIN CLINIC   No facility-administered encounter medications on file as of 04/23/2022.     Past Medical History:  Diagnosis Date   Abnormal Pap smear of cervix    --recurrent ascus w/Pos. 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   23 mm Sorin [employer] [place] bileaflet mechanical valve via right anterior mini thoracotomy   Tobacco abuse 09/27/2015   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   biopsy and CO2 laser ablation    Past Surgical History:  Procedure Laterality Date   ABDOMINAL HYSTERECTOMY  02/06/2009   Robotic  total laparoscopic hysterectomy   ANTERIOR CRUCIATE LIGAMENT REPAIR  12/01/1991   AORTIC VALVE REPLACEMENT N/A 11/03/2017   Procedure: MINIMALLY INVASIVE AORTIC VALVE REPLACEMENT( MINI THORACOTOMY);  Surgeon: Rexene Alberts, MD;  Location: [facility];  Service: Open Heart Surgery;  Laterality: N/A;   CARDIAC CATHETERIZATION  04/30/2014   no CAD - moderate AS noted   CERVICAL BIOPSY  W/ LOOP ELECTRODE EXCISION  11/30/2008   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 ECC   COLPOSCOPY  06/30/2001   CIN I   COLPOSCOPY  08/30/2004   ECC--atypia   COLPOSCOPY N/A 01/21/2016   Procedure: COLPOSCOPY with vaginal biopsy with CO 2 Laser of Vaginal and vulvar condyloma;  Surgeon: [name], MD;  Location: [facility];  Service: Gynecology;  Laterality: N/A;  [name] will be here 2/21 for 1115 case confirmed 01/16/15 - TS   HERNIA REPAIR Bilateral 1981/1982   inguinal   HIP SURGERY  1981   Hip Reset    HIP SURGERY  11/30/1988   Plate was reconstructed/ took out growth plate in Right knee   HIP SURGERY  11/30/1990   Plate removed in Left hip   LEFT AND RIGHT HEART CATHETERIZATION WITH CORONARY ANGIOGRAM N/A 05/18/2014   Procedure: LEFT AND RIGHT HEART CATHETERIZATION WITH CORONARY ANGIOGRAM;  Surgeon: Jettie Booze, MD;  Location: [facility];  Service: Cardiovascular;  Laterality: N/A;   LYMPH NODE BIOPSY  11/30/1993   NASAL SEPTUM SURGERY  11/30/2000   TEE WITHOUT CARDIOVERSION N/A 11/03/2017   Procedure: TRANSESOPHAGEAL ECHOCARDIOGRAM (TEE);  Surgeon: Rexene Alberts, MD;  Location: [facility];  Service: Open Heart Surgery;  Laterality: N/A;   TONSILLECTOMY AND ADENOIDECTOMY  11/30/1988   TYMPANOSTOMY TUBE PLACEMENT  1981/1982    Family History  Problem Relation Age of Onset   Hyperlipidemia Mother    Hypertension Mother    Thyroid disease Mother    Hyperlipidemia Father    Hypertension Father    Migraines Father     Hypertension Brother    Hyperlipidemia Brother    Thyroid disease Maternal Grandmother    Thyroid disease Maternal Grandfather    Prostate cancer Maternal Grandfather    Colon cancer Paternal Great-grandmother     Social History   Social History Narrative   Not on file     Review of Systems General: Denies fevers, chills, weight loss CV: Denies chest pain, shortness of breath, palpitations  Physical Exam    04/23/2022    1:42 PM 04/20/2022   10:36 AM 04/09/2022    2:38 PM  Vitals with BMI  Height 4'  11" '4\' 11"'$  '4\' 11"'$   Weight 121 lbs 119 lbs 124 lbs  BMI 24.43 30.94 07.68  Systolic 088 110 315  Diastolic 92 95 84  Pulse 69 69     General:  No acute distress,  Alert and oriented, Non-Toxic, Normal speech and affect Examination shows 2 to 3 cm cystic lesion in the right preauricular area.  Looks like a central punctum consistent with a sebaceous cyst.  It is freely mobile with no overlying skin changes.  No surrounding scars.  Assessment/Plan Patient presents with a cystic lesion in the right preauricular area.  I do think it be reasonable to excise this given its chronicity and her symptoms.  We discussed risks include bleeding, infection, damage to surrounding structures and need for additional procedures.  I do think it is fine to do this in conjunction with her GYN procedure and we will plan to coordinate with Dr. Charisse March office.  All of her questions were answered.  Cindra Presume 04/23/2022, 2:05 PM

## 2022-04-23 NOTE — Telephone Encounter (Signed)
Patient with diagnosis of aortic valve replacement on warfarin for anticoagulation.    Procedure: Vulvar laser, Vulvar excision, possible vulvar biopsy, possible vaginal biopsy with laser Date of procedure: TBD   CrCl 70 Platelet count - please repeat, > 1 year since last drawn  Will give final clearance once CBD reviewed

## 2022-04-24 ENCOUNTER — Other Ambulatory Visit: Payer: Self-pay | Admitting: *Deleted

## 2022-04-24 DIAGNOSIS — Z01818 Encounter for other preprocedural examination: Secondary | ICD-10-CM

## 2022-04-24 NOTE — Patient Instructions (Signed)
We are going to work on placing a referral to meet with a surgeon for evaluation of the right jaw lump. If surgery is recommended, we will see if they want to proceed with a joint surgery with [place].    Preparing for your Surgery   Plan for surgery with Dr. Jeral Pinch at [facility] (date and location subject to change based on need for joint surgery or not). You will be scheduled for vulvar laser, vulvar excision, possible vulvar biopsy, possible vaginal biopsy and laser.    Pre-operative Testing -You will receive a phone call from presurgical testing at [facility] to discuss surgery instructions and arrange for lab work if needed.   -Bring your insurance card, copy of an advanced directive if applicable, medication list.   -You should not be taking blood thinners or aspirin at least ten days prior to surgery unless instructed by your surgeon.   -Do not take supplements such as fish oil (omega 3), red yeast rice, turmeric before your surgery. You want to avoid medications with aspirin in them including headache powders such as BC or Goody's), Excedrin migraine.   Day Before Surgery at [facility] will be advised you can have clear liquids up until 3 hours before your surgery.     Your role in recovery Your role is to become active as soon as directed by your doctor, while still giving yourself time to heal.  Rest when you feel tired. You will be asked to do the following in order to speed your recovery:   - Cough and breathe deeply. This helps to clear and expand your lungs and can prevent pneumonia after surgery.  - [place]. Do mild physical activity. Walking or moving your legs help your circulation and body functions return to normal. Do not try to get up or walk alone the first time after surgery.   -If you develop swelling on one leg or the other, pain in the back of your leg, redness/warmth in one of your legs, please call  the office or go to the Emergency Room to have a doppler to rule out a blood clot. For shortness of breath, chest pain-seek care in the Emergency Room as soon as possible. - Actively manage your pain. Managing your pain lets you move in comfort. We will ask you to rate your pain on a scale of zero to 10. It is your responsibility to tell your doctor or nurse where and how much you hurt so your pain can be treated.   Special Considerations -Your final pathology results from surgery should be available around one week after surgery and the results will be relayed to you when available.   -FMLA forms can be faxed to [phone] and please allow 5-7 business days for completion.   Pain Management After Surgery -You will be prescribed your pain medication and bowel regimen medications before surgery so that you can have these available when you are discharged from the hospital. The pain medication is for use ONLY AFTER surgery and a new prescription will not be given.    -Make sure that you have Tylenol and Ibuprofen at home to use on a regular basis after surgery for pain control. We recommend alternating the medications every hour to six hours since they work differently and are processed in the body differently for pain relief.   -Review the attached handout on narcotic use and their risks and side effects.  Bowel Regimen -You will be prescribed Sennakot-S to take nightly to prevent constipation especially if you are taking the narcotic pain medication intermittently.  It is important to prevent constipation and drink adequate amounts of liquids. You can stop taking this medication when you are not taking pain medication and you are back on your normal bowel routine.   Risks of Surgery Risks of surgery are low but include bleeding, infection, damage to surrounding structures, re-operation, blood clots, and very rarely death.   AFTER SURGERY INSTRUCTIONS   Return to work:  2-4 weeks if  applicable   Activity: 1. Be up and out of the bed during the day.  Take a nap if needed.  You may walk up steps but be careful and use the hand rail.  Stair climbing will tire you more than you think, you may need to stop part way and rest.    2. No lifting or straining for 4 weeks over 10 pounds. No pushing, pulling, straining for 4 weeks.   3. No driving for minimum 24 hours after surgery.  Do not drive if you are taking narcotic pain medicine and make sure that your reaction time has returned.    4. You can shower as soon as the next day after surgery. Shower daily. No tub baths or submerging your body in water until cleared by your surgeon. If you have the soap that was given to you by pre-surgical testing that was used before surgery, you do not need to use it afterwards because this can irritate your incisions.    5. No sexual activity and nothing in the vagina for 4 weeks minimum.   6. You may experience vulvar/ vaginal spotting and discharge after surgery.  The spotting is normal but if you experience heavy bleeding, call our office.   7. Take Tylenol or ibuprofen first for pain if you are able to take these medications and only use narcotic pain medication for severe pain not relieved by the Tylenol or Ibuprofen.  Monitor your Tylenol intake to a max of 4,000 mg in a 24 hour period. You can alternate these medications after surgery.   Diet: 1. Low sodium Heart Healthy Diet is recommended but you are cleared to resume your normal (before surgery) diet after your procedure.   2. It is safe to use a laxative, such as Miralax or Colace, if you have difficulty moving your bowels. You will be prescribed Sennakot at bedtime every evening to keep bowel movements regular and to prevent constipation.     Wound Care: 1. Keep clean and dry.  Shower daily.   Reasons to call the Doctor: Fever - Oral temperature greater than 100.4 degrees Fahrenheit Foul-smelling vaginal discharge Difficulty  urinating Nausea and vomiting Increased pain at the site of the incision that is unrelieved with pain medicine. Difficulty breathing with or without chest pain New calf pain especially if only on one side Sudden, continuing increased vaginal bleeding with or without clots.   Contacts: For questions or concerns you should contact:   Dr. Jeral Pinch at [phone]   [name], NP at [phone]   After Hours: call [phone] and have the GYN Oncologist paged/contacted (after 5 pm or on the weekends).   Messages sent via mychart are for non-urgent matters and are not responded to after hours so for urgent needs, please call the after hours number.

## 2022-04-24 NOTE — Progress Notes (Signed)
Patient here with her mother for new patient consultation with Dr. Jeral Pinch and for a pre-operative discussion prior to her surgery. She is scheduled for vulvar laser, vulvar excision, possible vulvar biopsy, possible vaginal biopsy and laser.  The surgery was discussed in detail.  See after visit summary for additional details.    Discussed post-op pain management in detail including the aspects of the enhanced recovery pathway.  Advised her that a new prescription would be sent in closer to the surgery date and it is only to be used for after her upcoming surgery.  We discussed the use of tylenol post-op and to monitor for a maximum of 4,000 mg in a 24 hour period.  Also will also prescribe sennakot to be used after surgery and to hold if having loose stools.  Discussed bowel regimen in detail.     Discussed the use of SCDs and measures to take at home to prevent DVT including frequent mobility.  Reportable signs and symptoms of DVT discussed. Post-operative instructions discussed and expectations for after surgery. Incisional care discussed as well including reportable signs and symptoms including erythema, drainage, wound separation.     10 minutes spent with the patient.  Verbalizing understanding of material discussed. No needs or concerns voiced at the end of the visit.   Advised patient and family to call for any needs. We have placed a referral for the patient to meet with Plastic Surgery due to the superficial facial mass for possible joint surgery.  This appointment is included in the global surgical bundle as pre-operative teaching and has no charge.

## 2022-04-24 NOTE — Telephone Encounter (Signed)
Call placed to pt regarding surgical clearance and the need for a CBC in order to provide,left a detailed message that she already has a coumadin appt 05/07/22 and I will place a lab order and she can have it done on the same day or if she wanted to get it done sooner, the order is placed and she can go at her convenience.  Left a message for her to call back with questions.

## 2022-04-28 ENCOUNTER — Other Ambulatory Visit: Payer: Self-pay | Admitting: Family Medicine

## 2022-04-29 ENCOUNTER — Other Ambulatory Visit: Payer: Self-pay | Admitting: Gynecologic Oncology

## 2022-04-29 ENCOUNTER — Telehealth: Payer: Self-pay | Admitting: Gynecologic Oncology

## 2022-04-29 ENCOUNTER — Telehealth: Payer: Self-pay

## 2022-04-29 DIAGNOSIS — D071 Carcinoma in situ of vulva: Secondary | ICD-10-CM

## 2022-04-29 NOTE — Telephone Encounter (Signed)
Left message for surgery scheduler for Dr. Claudia Desanctis to arrange for a joint surgical procedure.

## 2022-04-29 NOTE — Telephone Encounter (Signed)
Per [name] NP, available surgery date for pt is on June 13. Pt is aware and was thankful for the call. Per pt's request, fax number was given so she can fax FMLA forms.

## 2022-05-04 NOTE — Telephone Encounter (Signed)
I s/w the pt today and advised per the pre op provider we really need her lab work to be done before 05/07/22, see notes from [place], [facility]. Pt agreeable to plan of care to have lab work tomorrow AM at the [place] location.  Pt also asked about how she go about getting an Interpreter for sign language to be at the hospital after her surgery with Dr. Jeral Pinch. I informed the pt that I was not quite sure, but I will see if our check out person may know. Pt thanked me for the call and the help. I asked her if ok for the check out to call her if they have questions, pt answered yes. Pt states she is not deaf but she is very [place]. She states her mom is a pt here and her mom is deaf.   I assured her that I will update the pre op provider that lab will be done tomorrow.

## 2022-05-04 NOTE — Telephone Encounter (Signed)
Would recommend to have a earlier CBC than 6/8 ( which means the lab result on 6/9), especially since holding time for Coumadin is typically 5 days.  And her surgery is already scheduled for 6/13.

## 2022-05-05 ENCOUNTER — Encounter (HOSPITAL_BASED_OUTPATIENT_CLINIC_OR_DEPARTMENT_OTHER): Payer: Self-pay | Admitting: Gynecologic Oncology

## 2022-05-05 LAB — CBC
Hematocrit: 40.8 % (ref 34.0–46.6)
Hemoglobin: 14.4 g/dL (ref 11.1–15.9)
MCH: 31.2 pg (ref 26.6–33.0)
MCHC: 35.3 g/dL (ref 31.5–35.7)
MCV: 89 fL (ref 79–97)
Platelets: 140 10*3/uL — ABNORMAL LOW (ref 150–450)
RBC: 4.61 x10E6/uL (ref 3.77–5.28)
RDW: 12.7 % (ref 11.7–15.4)
WBC: 5.6 10*3/uL (ref 3.4–10.8)

## 2022-05-05 NOTE — Progress Notes (Addendum)
Spoke w/ via phone for pre-op interview--- pt Lab needs dos----  istat   (possible PT/ INR dos , will not know until coumadin clinic gives instructions)          Lab results------ current ekg in epic/ chart COVID test -----patient states asymptomatic no test needed Arrive at ------- 0945 on 05-12-2022 NPO after MN NO Solid Food.  Clear liquids from MN until--- 0845 Med rec completed Medications to take morning of surgery ----- gabapentin, lamictal, cymbalta, tramadol, pravastatin, lopressor, prevacid Diabetic medication ----- n/a Patient instructed no nail polish to be worn day of surgery Patient instructed to bring photo id and insurance card day of surgery Patient aware to have Driver (ride ) / caregiver  for 24 hours after surgery --- mother, theresa Patient Special Instructions ----- asked to bring rescue inhaler dos. 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  Pt's mother is also deaf but is aware that video can be uses for her.  Requested interpreter via email to [facility] interpreting. Pt requested to be positioned in OR prior to induction due to chronic left hip pain.  Patient verbalized understanding of instructions that were given at this phone interview. Patient denies shortness of breath, chest pain, fever, cough at this phone interview.    Anesthesia Review:  Pt stated due to auditory processing disorder she is unable to hear when waking from anesthesia. HTN;  s/p AVR 12/ 2018 on coumadin; asthma  PCP: Dr Alona Bene Cardiologist : Dr T. Turner Cassell Clement 01-15-2022 epic) Chest x-ray : 06-26-2021 EKG : 01-15-2022 Echo : 02-19-2022 Stress test: no Cardiac Cath :  04-30-2014 epic Activity level:  denies sob w/ any activity  Blood Thinner/ Instructions Maryjane Hurter  Dose: Coumadin ASA / Instructions/ Last Dose :  ASA '81mg'$  Pt aware to follow instructions given for coumadin/ asa instructions from cardiology/ coumadin clinic

## 2022-05-07 ENCOUNTER — Telehealth: Payer: Self-pay | Admitting: *Deleted

## 2022-05-07 ENCOUNTER — Encounter: Payer: Self-pay | Admitting: Internal Medicine

## 2022-05-07 ENCOUNTER — Ambulatory Visit (INDEPENDENT_AMBULATORY_CARE_PROVIDER_SITE_OTHER): Payer: Managed Care, Other (non HMO) | Admitting: *Deleted

## 2022-05-07 DIAGNOSIS — Z7901 Long term (current) use of anticoagulants: Secondary | ICD-10-CM

## 2022-05-07 DIAGNOSIS — Z954 Presence of other heart-valve replacement: Secondary | ICD-10-CM

## 2022-05-07 DIAGNOSIS — Q230 Congenital stenosis of aortic valve: Secondary | ICD-10-CM

## 2022-05-07 DIAGNOSIS — Q231 Congenital insufficiency of aortic valve: Secondary | ICD-10-CM | POA: Diagnosis not present

## 2022-05-07 LAB — POCT INR: INR: 1.6 — AB (ref 2.0–3.0)

## 2022-05-07 NOTE — Telephone Encounter (Signed)
I s/w the pt and she is agreeable to plan of care for tele pre op appt. Pt is ok to do a telephone visit, as she is [facility], but she states she is ok for the phone call. Tele pre op appt 05/08/22 @ 9:20 per the pt request for appt time. Med rec and consent are done.

## 2022-05-07 NOTE — Telephone Encounter (Signed)
    Name: Danielle Obrien  DOB: 22-May-1981  MRN: [id number]  Primary Cardiologist: Fransico Him, MD   Preoperative team, please contact this patient and set up a phone call appointment for further preoperative risk assessment. Please obtain consent and complete medication review. Thank you for your help.  I confirm that guidance regarding antiplatelet and oral anticoagulation therapy has been completed and, if necessary, noted below.  Patient with diagnosis of mechanical aortic valve on warfarin for anticoagulation.     Procedure: Vulvar laser, Vulvar excision, possible vulvar biopsy, possible vaginal biopsy with laser Date of procedure: 05/12/22     CrCl 70 Platelet count 140   Per office protocol, patient can hold warfarin for 5 days prior to procedure. Patient will not need bridging with Lovenox (enoxaparin) around procedure.  Patient was seen in office for INR check on 05/07/2022 and was given hold instructions for procedure with above date.     [name], NP 05/07/2022, 10:50 AM [facility] [address] [place], [place] [address]

## 2022-05-07 NOTE — Patient Instructions (Signed)
Description   Start holding for upcoming procedure then resume taking normal dose of warfarin after procedure 1 tablet daily except for 1.5 tablets on Sundays and Thursdays.  Recheck INR in 1 week post surgery. Coumadin Clinic [phone]

## 2022-05-07 NOTE — Telephone Encounter (Signed)
I s/w the pt and she is agreeable to plan of care for tele pre op appt. Pt is ok to do a telephone visit, as she is [facility], but she states she is ok for the phone call. Tele pre op appt 05/08/22 @ 9:20 per the pt request for appt time. Med rec and consent are done.     Patient Consent for Virtual Visit        Danielle Obrien has provided verbal consent on 05/07/2022 for a virtual visit (video or telephone).   CONSENT FOR VIRTUAL VISIT FOR:  Danielle Obrien  By participating in this virtual visit I agree to the following:  I hereby voluntarily request, consent and authorize [facility] and its employed or contracted physicians, physician assistants, nurse practitioners or other licensed health care professionals (the Practitioner), to provide me with telemedicine health care services (the "Services") as deemed necessary by the treating Practitioner. I acknowledge and consent to receive the Services by the Practitioner via telemedicine. I understand that the telemedicine visit will involve communicating with the Practitioner through live audiovisual communication technology and the disclosure of certain medical information by electronic transmission. I acknowledge that I have been given the opportunity to request an in-person assessment or other available alternative prior to the telemedicine visit and am voluntarily participating in the telemedicine visit.  I understand that I have the right to withhold or withdraw my consent to the use of telemedicine in the course of my care at any time, without affecting my right to future care or treatment, and that the Practitioner or I may terminate the telemedicine visit at any time. I understand that I have the right to inspect all information obtained and/or recorded in the course of the telemedicine visit and may receive copies of available information for a reasonable fee.  I understand that some of the potential risks of receiving the Services via  telemedicine include:  Delay or interruption in medical evaluation due to technological equipment failure or disruption; Information transmitted may not be sufficient (e.g. poor resolution of images) to allow for appropriate medical decision making by the Practitioner; and/or  In rare instances, security protocols could fail, causing a breach of personal health information.  Furthermore, I acknowledge that it is my responsibility to provide information about my medical history, conditions and care that is complete and accurate to the best of my ability. I acknowledge that Practitioner's advice, recommendations, and/or decision may be based on factors not within their control, such as incomplete or inaccurate data provided by me or distortions of diagnostic images or specimens that may result from electronic transmissions. I understand that the practice of medicine is not an exact science and that Practitioner makes no warranties or guarantees regarding treatment outcomes. I acknowledge that a copy of this consent can be made available to me via my patient portal ([place]), or I can request a printed copy by calling the office of [facility].    I understand that my insurance will be billed for this visit.   I have read or had this consent read to me. I understand the contents of this consent, which adequately explains the benefits and risks of the Services being provided via telemedicine.  I have been provided ample opportunity to ask questions regarding this consent and the Services and have had my questions answered to my satisfaction. I give my informed consent for the services to be provided through the use of telemedicine in my medical care

## 2022-05-07 NOTE — Telephone Encounter (Signed)
Patient with diagnosis of mechanical aortic valve on warfarin for anticoagulation.    Procedure: Vulvar laser, Vulvar excision, possible vulvar biopsy, possible vaginal biopsy with laser Date of procedure: 05/12/22   CrCl 70 Platelet count 140  Per office protocol, patient can hold warfarin for 5 days prior to procedure.   Patient will not need bridging with Lovenox (enoxaparin) around procedure.  Patient in office for INR check today, has been given hold instructions for procedure with above date

## 2022-05-08 ENCOUNTER — Ambulatory Visit (INDEPENDENT_AMBULATORY_CARE_PROVIDER_SITE_OTHER): Payer: Managed Care, Other (non HMO) | Admitting: Nurse Practitioner

## 2022-05-08 ENCOUNTER — Telehealth: Payer: Self-pay | Admitting: *Deleted

## 2022-05-08 DIAGNOSIS — Z01810 Encounter for preprocedural cardiovascular examination: Secondary | ICD-10-CM

## 2022-05-08 NOTE — Telephone Encounter (Signed)
Followed up with pt regarding the reddened area in her groin/pelvis area. Per [name], NP pt was instructed to keep using the OTC Cortizone cream if the pt thinks it is helping the area. Pt also informed that the area will be checked during her upcoming surgery. Pt verbalized understanding.

## 2022-05-08 NOTE — Progress Notes (Signed)
Virtual Visit via Telephone Note   Because of Danielle Obrien's co-morbid illnesses, she is at least at moderate risk for complications without adequate follow up.  This format is felt to be most appropriate for this patient at this time.  The patient did not have access to video technology/had technical difficulties with video requiring transitioning to audio format only (telephone).  All issues noted in this document were discussed and addressed.  No physical exam could be performed with this format.  Please refer to the patient's chart for her consent to telehealth for [facility].  Evaluation Performed:  Preoperative cardiovascular risk assessment _____________   Date:  05/08/2022   Patient ID:  Danielle Obrien, DOB 1981/04/15, MRN [id number] Patient Location:  Home Provider location:   Office  Primary Care Provider:  Janith Lima, MD Primary Cardiologist:  Fransico Him, MD  Chief Complaint / Patient Profile   41 y.o. y/o female with a h/o bicuspid aortic valve with severe aortic stenosis s/p minimally invasive AVR in 2018 on Coumadin, hypertension, hyperlipidemia, bipolar disorder, auditory processing disorder (HOH), asthma, vaginal dysplasia, condyloma acuminatum, GERD, and arthritis who is pending vulvar laser, vulvar excision, possible vulvar biopsy, possible vaginal biopsy with laser scheduled for 6/13/02023 with Dr. Georgiann Mccoy of [facility] and presents today for telephonic preoperative cardiovascular risk assessment.  Past Medical History    Past Medical History:  Diagnosis Date   ADD (attention deficit disorder)    Alcohol abuse, in remission 2012   per pt in remission since 2012   Anticoagulated on Coumadin    coumadin--- managed by cardiology/ coumadin clinic   Arthritis    In hips   Asthma    allergy induced, rare inhaler use   Auditory processing disorder    per pt very HOH, first language ASL   Bipolar disorder ([place])     Chronic left hip pain    Chronic vertigo    Complication of anesthesia    per pt due to auditory processing disorder pt is unable to hear when waking up and also gets very anxious   GAD (generalized anxiety disorder)    GERD (gastroesophageal reflux disease)    History of cervical dysplasia    History of condyloma acuminatum 2017   s/p laser ablation vulva   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   biopsy and CO2 laser ablation   HOH (hard of hearing)    per pt very HOH due to auditroy processing disorder   Hyperlipidemia    Hypertension    Muscle spasms of both lower extremities    both hips   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   23 mm Sorin [employer] [place] bileaflet mechanical valve via right anterior mini thoracotomy   Vulvar dysplasia    Past Surgical History:  Procedure Laterality Date   ANTERIOR CRUCIATE LIGAMENT REPAIR  1993   AORTIC VALVE REPLACEMENT N/A 11/03/2017   Procedure: MINIMALLY INVASIVE AORTIC VALVE REPLACEMENT( MINI THORACOTOMY);  Surgeon: Rexene Alberts, MD;  Location: [facility];  Service: Open Heart Surgery;  Laterality: N/A;   CERVICAL BIOPSY  W/ LOOP ELECTRODE EXCISION  2010   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 ECC   COLPOSCOPY  06/30/2001   CIN I   COLPOSCOPY  08/30/2004   ECC--atypia  COLPOSCOPY N/A 01/21/2016   Procedure: COLPOSCOPY with vaginal biopsy with CO 2 Laser of Vaginal and vulvar condyloma;  Surgeon: [name], MD;  Location: [facility];  Service: Gynecology;  Laterality: N/A;  [name] will be here 2/21 for 1115 case confirmed 01/16/15 - TS   HARDWARE REMOVAL Left 1992   Left hip   INGUINAL HERNIA REPAIR Bilateral    1981;  [id number]   LEFT AND RIGHT HEART CATHETERIZATION WITH CORONARY ANGIOGRAM N/A 05/18/2014   Procedure: LEFT AND RIGHT HEART CATHETERIZATION WITH  CORONARY ANGIOGRAM;  Surgeon: Jettie Booze, MD;  Location[facility];  Service: Cardiovascular;  Laterality: N/A;   LYMPH NODE BIOPSY  1995   NASAL SEPTUM SURGERY  2002   ORIF HIP FRACTURE Left 1990   per pt and  took out growth plate in Right knee   ROBOTIC ASSISTED TOTAL HYSTERECTOMY  02/26/2009   '@[facility]'$    TEE WITHOUT CARDIOVERSION N/A 11/03/2017   Procedure: TRANSESOPHAGEAL ECHOCARDIOGRAM (TEE);  Surgeon: Rexene Alberts, MD;  Location: [facility];  Service: Open Heart Surgery;  Laterality: N/A;   TONSILLECTOMY AND ADENOIDECTOMY  1990   TYMPANOSTOMY TUBE PLACEMENT Bilateral    x3  per pt last one 1982    Allergies  Allergies  Allergen Reactions   Demerol Nausea And Vomiting   Ibuprofen Other (See Comments)    Gastritis     History of Present Illness    Danielle Obrien is a 41 y.o. female who presents via audio/video conferencing for a telehealth visit today.  Pt was last seen in cardiology clinic on 01/15/2022 by Dr. Radford Pax.  At that time Danielle Obrien was doing well a cardiac standpoint. Repeat echocardiogram in March 2023 showed normally functioning, stable aortic valve replacement.  The patient is now pending procedure as outlined above. Since her last visit, she done well from a cardiac standpoint. She denies chest pain, palpitations, dyspnea, pnd, orthopnea, n, v, dizziness, syncope, edema, weight gain, or early satiety. All other systems reviewed and are otherwise negative except as noted above.  She reports feeling well overall and denies any new symptoms or concerns today.  Prior to Admission medications   Medication Sig Start Date End Date Taking? Authorizing Provider  albuterol (VENTOLIN HFA) 108 (90 Base) MCG/ACT inhaler Inhale 2 puffs into the lungs every 6 (six) hours as needed for wheezing or shortness of breath. 01/27/22   Janith Lima, MD  aspirin EC 81 MG tablet Take 81 mg by mouth daily. Swallow whole.    [provider]  DULoxetine (CYMBALTA) 30  MG capsule TAKE ONE CAPSULE BY MOUTH DAILY Patient taking differently: 30 mg daily. 04/21/22   Lyndal Pulley, DO  gabapentin (NEURONTIN) 300 MG capsule TAKE TWO CAPSULES BY MOUTH THREE TIMES A DAY Patient taking differently: Take 600 mg by mouth 3 (three) times daily. TAKE TWO CAPSULES BY MOUTH THREE TIMES A DAY 04/21/22   Lyndal Pulley, DO  lamoTRIgine (LAMICTAL) 200 MG tablet TAKE TWO TABLETS BY MOUTH DAILY Patient taking differently: Take 400 mg by mouth daily. 04/11/22   Janith Lima, MD  lansoprazole (PREVACID) 15 MG capsule Take 15 mg by mouth daily as needed (for heartburn or acid reflux).    [provider]  meclizine (ANTIVERT) 12.5 MG tablet TAKE ONE TABLET BY MOUTH TWICE A DAY AS NEEDED FOR DIZZINESS Patient taking differently: 2 (two) times daily as needed. 09/19/20   Lyndal Pulley, DO  metoprolol tartrate (LOPRESSOR) 25  MG tablet Take 0.5 tablets (12.5 mg total) by mouth daily. 01/15/22   Sueanne Margarita, MD  pravastatin (PRAVACHOL) 20 MG tablet Take 1 tablet (20 mg total) by mouth daily. 01/29/22   Ailene Ards, NP  tiZANidine (ZANAFLEX) 4 MG tablet TAKE ONE TABLET BY MOUTH AT BEDTIME Patient taking differently: Take 4 mg by mouth at bedtime. And per pt takes as needed once during the day 04/29/22   Lyndal Pulley, DO  traMADol (ULTRAM) 50 MG tablet TAKE TWO TABLETS BY MOUTH TWICE A DAY Patient taking differently: Take 100 mg by mouth 2 (two) times daily. 04/07/22   Lyndal Pulley, DO  warfarin (COUMADIN) 7.5 MG tablet TAKE 1 TO 1.5 TABLETS BY MOUTH ONCE DAILY AS DIRECTED BY COUMADIN CLINIC Patient taking differently: as directed. TAKE 1 TO 1.5 TABLETS BY MOUTH ONCE DAILY AS DIRECTED BY COUMADIN CLINIC 01/27/22   Sueanne Margarita, MD    Physical Exam    Vital Signs:  ELLAWYN WOGAN does not have vital signs available for review today.  Given telephonic nature of communication, physical exam is limited. AAOx3. NAD. Normal affect.  Speech and respirations are  unlabored.  Accessory Clinical Findings    None  Assessment & Plan    1.  Preoperative Cardiovascular Risk Assessment:  According to the Revised Cardiac Risk Index (RCRI), her Perioperative Risk of Major Cardiac Event is (%): 0.4. Her Functional Capacity in METs is: 7.99 according to the Duke Activity Status Index (DASI).Therefore, based on ACC/AHA guidelines, patient would be at acceptable risk for the planned procedure without further cardiovascular testing.   Patient was advised that if she develops new symptoms prior to surgery to contact her office to arrange a follow-up appointment.  She verbalized understanding.  Patient with diagnosis of mechanical aortic valve on warfarin for anticoagulation.     Procedure: Vulvar laser, Vulvar excision, possible vulvar biopsy, possible vaginal biopsy with laser Date of procedure: 05/12/22     CrCl 70 Platelet count 140   Per office protocol, patient can hold warfarin for 5 days prior to procedure. Patient will not need bridging with Lovenox (enoxaparin) around procedure.  Please resume warfarin as soon as possible postprocedure, at the discretion of the surgeon.  Patient was seen in office for INR check on 05/07/2022 and was given hold instructions for procedure with above date.    SBE prophylaxis is not required for this procedure. A copy of this note will be routed to requesting surgeon.  Time:   Today, I have spent 6 minutes with the patient with telehealth technology discussing medical history, symptoms, and management plan.     [name], NP  05/08/2022, 9:28 AM

## 2022-05-08 NOTE — Telephone Encounter (Signed)
Spoke with pt this morning who stated she has stopped her warfarin for the surgery. She mentioned that she did have a a scab on the upper right side of her groin pelvis area. The scab has fallen off and now it's a red area that is itching. It's been like this for about a week. Pt has been putting OTC Cortizone cream on it BID and it's been helping. She denies any pain, fever, odor, with it. She's still having the small amount of clear yellow discharge which is not new. She just wondering is there anything thing else she can do for this and wanted [place] to be aware of it before surgery. Informed her we would let the provider know and reach back out to her. She verbalized understanding. [name], Np informed.   She also stated that they are able to get her a in person sign language interpreter for her in PACU.

## 2022-05-08 NOTE — Progress Notes (Signed)
See progress note dated 05/05/22 from Jeral Fruit, RN.  Per Coumadin clinic instructions dated 05/07/22 patient needs INR rechecked 1 week after surgery on 05/12/22. Patient was instructed to hold Coumadin for 5 days (6/8 thru 05/11/22.)  Patient received cardiac clearance dated 05/08/22 via OV w/ Diona Browner, NP in chart & Epic.  ASL interpreter confirmed for day of surgery.

## 2022-05-11 ENCOUNTER — Telehealth: Payer: Self-pay | Admitting: *Deleted

## 2022-05-11 NOTE — Progress Notes (Signed)
Spoke with paige  deaf interpreter will be Sherry Ruffing arriving at 46 am 05-12-2022, deaf interpeting number to call if problems morning of surgery is [phone] extension [phone],  spoke with abby in pre op o put note on patient chart to check pt skin for redness in groin area dos.

## 2022-05-11 NOTE — Telephone Encounter (Signed)
Telephone call to check on pre-operative status.  Patient compliant with pre-operative instructions.  Reinforced nothing to eat after midnight. Clear liquids until 0845. Patient to arrive at 0945.  No questions or concerns voiced.  Instructed to call for any needs. Pt also mentioned that her platelet is 140 and was wondering if it was still ok for her to have the procedure performed. Per [name], NP pt can still proceed with the procedure. Pt verbalized understanding.

## 2022-05-12 ENCOUNTER — Ambulatory Visit (HOSPITAL_BASED_OUTPATIENT_CLINIC_OR_DEPARTMENT_OTHER): Payer: Managed Care, Other (non HMO) | Admitting: Certified Registered Nurse Anesthetist

## 2022-05-12 ENCOUNTER — Ambulatory Visit (HOSPITAL_BASED_OUTPATIENT_CLINIC_OR_DEPARTMENT_OTHER)
Admission: RE | Admit: 2022-05-12 | Discharge: 2022-05-12 | Disposition: A | Discharge status: Home / Self Care | Payer: Managed Care, Other (non HMO) | Source: Ambulatory Visit | Attending: Gynecologic Oncology | Admitting: Gynecologic Oncology

## 2022-05-12 ENCOUNTER — Encounter (HOSPITAL_BASED_OUTPATIENT_CLINIC_OR_DEPARTMENT_OTHER): Payer: Self-pay | Admitting: Gynecologic Oncology

## 2022-05-12 ENCOUNTER — Other Ambulatory Visit: Payer: Self-pay

## 2022-05-12 ENCOUNTER — Encounter (HOSPITAL_BASED_OUTPATIENT_CLINIC_OR_DEPARTMENT_OTHER)
Admission: RE | Disposition: A | Discharge status: Home / Self Care | Payer: Self-pay | Source: Ambulatory Visit | Attending: Gynecologic Oncology

## 2022-05-12 DIAGNOSIS — L578 Other skin changes due to chronic exposure to nonionizing radiation: Secondary | ICD-10-CM | POA: Diagnosis not present

## 2022-05-12 DIAGNOSIS — J449 Chronic obstructive pulmonary disease, unspecified: Secondary | ICD-10-CM

## 2022-05-12 DIAGNOSIS — D071 Carcinoma in situ of vulva: Secondary | ICD-10-CM | POA: Insufficient documentation

## 2022-05-12 DIAGNOSIS — I10 Essential (primary) hypertension: Secondary | ICD-10-CM | POA: Diagnosis not present

## 2022-05-12 DIAGNOSIS — K219 Gastro-esophageal reflux disease without esophagitis: Secondary | ICD-10-CM | POA: Diagnosis not present

## 2022-05-12 DIAGNOSIS — F17210 Nicotine dependence, cigarettes, uncomplicated: Secondary | ICD-10-CM | POA: Diagnosis not present

## 2022-05-12 DIAGNOSIS — Z01818 Encounter for other preprocedural examination: Secondary | ICD-10-CM

## 2022-05-12 DIAGNOSIS — L720 Epidermal cyst: Secondary | ICD-10-CM | POA: Diagnosis not present

## 2022-05-12 DIAGNOSIS — D072 Carcinoma in situ of vagina: Secondary | ICD-10-CM

## 2022-05-12 DIAGNOSIS — Z7901 Long term (current) use of anticoagulants: Secondary | ICD-10-CM | POA: Insufficient documentation

## 2022-05-12 DIAGNOSIS — Z952 Presence of prosthetic heart valve: Secondary | ICD-10-CM | POA: Diagnosis not present

## 2022-05-12 DIAGNOSIS — F419 Anxiety disorder, unspecified: Secondary | ICD-10-CM | POA: Diagnosis not present

## 2022-05-12 DIAGNOSIS — N7689 Other specified inflammation of vagina and vulva: Secondary | ICD-10-CM | POA: Diagnosis not present

## 2022-05-12 DIAGNOSIS — Z79899 Other long term (current) drug therapy: Secondary | ICD-10-CM | POA: Insufficient documentation

## 2022-05-12 DIAGNOSIS — A630 Anogenital (venereal) warts: Secondary | ICD-10-CM | POA: Insufficient documentation

## 2022-05-12 DIAGNOSIS — Z87411 Personal history of vaginal dysplasia: Secondary | ICD-10-CM

## 2022-05-12 DIAGNOSIS — L729 Follicular cyst of the skin and subcutaneous tissue, unspecified: Secondary | ICD-10-CM | POA: Diagnosis present

## 2022-05-12 HISTORY — DX: Unspecified hearing loss, unspecified ear: H91.90

## 2022-05-12 HISTORY — DX: Other complications of anesthesia, initial encounter: T88.59XA

## 2022-05-12 HISTORY — DX: Gastro-esophageal reflux disease without esophagitis: K21.9

## 2022-05-12 HISTORY — PX: LESION REMOVAL: SHX5196

## 2022-05-12 HISTORY — PX: CO2 LASER APPLICATION: SHX5778

## 2022-05-12 HISTORY — DX: Dysplasia of vulva, unspecified: N90.3

## 2022-05-12 HISTORY — DX: Generalized anxiety disorder: F41.1

## 2022-05-12 HISTORY — PX: VULVA /PERINEUM BIOPSY: SHX319

## 2022-05-12 HISTORY — DX: Personal history of cervical dysplasia: Z87.410

## 2022-05-12 HISTORY — DX: Other chronic pain: G89.29

## 2022-05-12 HISTORY — DX: Hyperlipidemia, unspecified: E78.5

## 2022-05-12 HISTORY — DX: Dizziness and giddiness: R42

## 2022-05-12 HISTORY — PX: VULVECTOMY: SHX1086

## 2022-05-12 HISTORY — DX: Central auditory processing disorder: H93.25

## 2022-05-12 HISTORY — DX: Long term (current) use of anticoagulants: Z79.01

## 2022-05-12 LAB — POCT I-STAT, CHEM 8
BUN: 7 mg/dL (ref 6–20)
Calcium, Ion: 1.19 mmol/L (ref 1.15–1.40)
Chloride: 103 mmol/L (ref 98–111)
Creatinine, Ser: 0.7 mg/dL (ref 0.44–1.00)
Glucose, Bld: 106 mg/dL — ABNORMAL HIGH (ref 70–99)
HCT: 43 % (ref 36.0–46.0)
Hemoglobin: 14.6 g/dL (ref 12.0–15.0)
Potassium: 4.2 mmol/L (ref 3.5–5.1)
Sodium: 139 mmol/L (ref 135–145)
TCO2: 24 mmol/L (ref 22–32)

## 2022-05-12 SURGERY — CO2 LASER APPLICATION
Anesthesia: General | Laterality: Right

## 2022-05-12 MED ORDER — LIDOCAINE 2% (20 MG/ML) 5 ML SYRINGE
INTRAMUSCULAR | Status: DC | PRN
  Administered 2022-05-12: 40 mg via INTRAVENOUS

## 2022-05-12 MED ORDER — FENTANYL CITRATE (PF) 100 MCG/2ML IJ SOLN
INTRAMUSCULAR | Status: AC
  Filled 2022-05-12: qty 2

## 2022-05-12 MED ORDER — MIDAZOLAM HCL 5 MG/5ML IJ SOLN
INTRAMUSCULAR | Status: DC | PRN
  Administered 2022-05-12: 2 mg via INTRAVENOUS

## 2022-05-12 MED ORDER — LIDOCAINE-EPINEPHRINE 1 %-1:100000 IJ SOLN
INTRAMUSCULAR | Status: DC | PRN
  Administered 2022-05-12: 7 mL

## 2022-05-12 MED ORDER — EPHEDRINE 5 MG/ML INJ
INTRAVENOUS | Status: AC
  Filled 2022-05-12: qty 5

## 2022-05-12 MED ORDER — ACETIC ACID 5 % SOLN
Status: DC | PRN
  Administered 2022-05-12: 1 via TOPICAL

## 2022-05-12 MED ORDER — 0.9 % SODIUM CHLORIDE (POUR BTL) OPTIME
TOPICAL | Status: DC | PRN
  Administered 2022-05-12: 1000 mL

## 2022-05-12 MED ORDER — ONDANSETRON HCL 4 MG/2ML IJ SOLN
INTRAMUSCULAR | Status: AC
  Filled 2022-05-12: qty 2

## 2022-05-12 MED ORDER — MIDAZOLAM HCL 2 MG/2ML IJ SOLN: 0.5000 mg | Freq: Once | INTRAMUSCULAR | Status: DC | PRN

## 2022-05-12 MED ORDER — CHLORHEXIDINE GLUCONATE CLOTH 2 % EX PADS: 6.0000 | MEDICATED_PAD | Freq: Once | CUTANEOUS | Status: DC

## 2022-05-12 MED ORDER — DEXAMETHASONE SODIUM PHOSPHATE 10 MG/ML IJ SOLN
INTRAMUSCULAR | Status: AC
  Filled 2022-05-12: qty 1

## 2022-05-12 MED ORDER — MEPERIDINE HCL 25 MG/ML IJ SOLN: 6.2500 mg | INTRAMUSCULAR | Status: DC | PRN

## 2022-05-12 MED ORDER — DEXAMETHASONE SODIUM PHOSPHATE 10 MG/ML IJ SOLN: 4.0000 mg | INTRAMUSCULAR | Status: DC

## 2022-05-12 MED ORDER — BUPIVACAINE HCL 0.25 % IJ SOLN
INTRAMUSCULAR | Status: DC | PRN
  Administered 2022-05-12: 30 mL

## 2022-05-12 MED ORDER — ACETAMINOPHEN 500 MG PO TABS: 1000.0000 mg | ORAL_TABLET | ORAL | Status: DC

## 2022-05-12 MED ORDER — ACETAMINOPHEN 500 MG PO TABS
1000.0000 mg | ORAL_TABLET | Freq: Once | ORAL | Status: AC
  Administered 2022-05-12: 1000 mg via ORAL

## 2022-05-12 MED ORDER — LACTATED RINGERS IV SOLN: INTRAVENOUS | Status: DC

## 2022-05-12 MED ORDER — SCOPOLAMINE 1 MG/3DAYS TD PT72
1.0000 | MEDICATED_PATCH | TRANSDERMAL | Status: DC
Start: 2022-05-12 — End: 2022-05-12

## 2022-05-12 MED ORDER — SILVER SULFADIAZINE 1 % EX CREA: 1.0000 "application " | TOPICAL_CREAM | Freq: Every day | CUTANEOUS | 0 refills | Status: DC

## 2022-05-12 MED ORDER — ONDANSETRON HCL 4 MG/2ML IJ SOLN
INTRAMUSCULAR | Status: DC | PRN
  Administered 2022-05-12: 4 mg via INTRAVENOUS

## 2022-05-12 MED ORDER — ACETAMINOPHEN 500 MG PO TABS
ORAL_TABLET | ORAL | Status: AC
  Filled 2022-05-12: qty 2

## 2022-05-12 MED ORDER — EPHEDRINE SULFATE-NACL 50-0.9 MG/10ML-% IV SOSY
PREFILLED_SYRINGE | INTRAVENOUS | Status: DC | PRN
  Administered 2022-05-12: 5 mg via INTRAVENOUS
  Administered 2022-05-12 (×2): 10 mg via INTRAVENOUS

## 2022-05-12 MED ORDER — FENTANYL CITRATE (PF) 250 MCG/5ML IJ SOLN
INTRAMUSCULAR | Status: AC
  Filled 2022-05-12: qty 5

## 2022-05-12 MED ORDER — CEFAZOLIN SODIUM-DEXTROSE 2-4 GM/100ML-% IV SOLN
INTRAVENOUS | Status: AC
  Filled 2022-05-12: qty 100

## 2022-05-12 MED ORDER — DEXAMETHASONE SODIUM PHOSPHATE 10 MG/ML IJ SOLN
INTRAMUSCULAR | Status: DC | PRN
  Administered 2022-05-12: 10 mg via INTRAVENOUS

## 2022-05-12 MED ORDER — PROPOFOL 10 MG/ML IV BOLUS
INTRAVENOUS | Status: AC
  Filled 2022-05-12: qty 20

## 2022-05-12 MED ORDER — FENTANYL CITRATE (PF) 100 MCG/2ML IJ SOLN
INTRAMUSCULAR | Status: DC | PRN
  Administered 2022-05-12 (×2): 50 ug via INTRAVENOUS

## 2022-05-12 MED ORDER — SCOPOLAMINE 1 MG/3DAYS TD PT72
1.0000 | MEDICATED_PATCH | TRANSDERMAL | Status: DC
  Administered 2022-05-12: 1.5 mg via TRANSDERMAL

## 2022-05-12 MED ORDER — LIDOCAINE HCL (PF) 2 % IJ SOLN
INTRAMUSCULAR | Status: AC
  Filled 2022-05-12: qty 5

## 2022-05-12 MED ORDER — OXYCODONE HCL 5 MG/5ML PO SOLN: 5.0000 mg | Freq: Once | ORAL | Status: DC | PRN

## 2022-05-12 MED ORDER — PROPOFOL 10 MG/ML IV BOLUS
INTRAVENOUS | Status: DC | PRN
  Administered 2022-05-12: 150 mg via INTRAVENOUS
  Administered 2022-05-12: 50 mg via INTRAVENOUS

## 2022-05-12 MED ORDER — MIDAZOLAM HCL 2 MG/2ML IJ SOLN
INTRAMUSCULAR | Status: AC
  Filled 2022-05-12: qty 2

## 2022-05-12 MED ORDER — SILVER SULFADIAZINE 1 % EX CREA
TOPICAL_CREAM | CUTANEOUS | Status: DC | PRN
  Administered 2022-05-12: 1 via TOPICAL

## 2022-05-12 MED ORDER — OXYCODONE HCL 5 MG PO TABS: 5.0000 mg | ORAL_TABLET | ORAL | 0 refills | Status: DC | PRN

## 2022-05-12 MED ORDER — PHENYLEPHRINE 80 MCG/ML (10ML) SYRINGE FOR IV PUSH (FOR BLOOD PRESSURE SUPPORT)
PREFILLED_SYRINGE | INTRAVENOUS | Status: DC | PRN
  Administered 2022-05-12: 160 ug via INTRAVENOUS

## 2022-05-12 MED ORDER — KETAMINE HCL 10 MG/ML IJ SOLN
INTRAMUSCULAR | Status: DC | PRN
  Administered 2022-05-12: 30 mg via INTRAVENOUS

## 2022-05-12 MED ORDER — OXYCODONE HCL 5 MG PO TABS: 5.0000 mg | ORAL_TABLET | Freq: Once | ORAL | Status: DC | PRN

## 2022-05-12 MED ORDER — KETAMINE HCL 50 MG/5ML IJ SOSY
PREFILLED_SYRINGE | INTRAMUSCULAR | Status: AC
  Filled 2022-05-12: qty 5

## 2022-05-12 MED ORDER — SCOPOLAMINE 1 MG/3DAYS TD PT72
MEDICATED_PATCH | TRANSDERMAL | Status: AC
  Filled 2022-05-12: qty 1

## 2022-05-12 MED ORDER — SENNOSIDES-DOCUSATE SODIUM 8.6-50 MG PO TABS: 2.0000 | ORAL_TABLET | Freq: Every day | ORAL | 0 refills | Status: DC

## 2022-05-12 MED ORDER — CEFAZOLIN SODIUM-DEXTROSE 2-4 GM/100ML-% IV SOLN
2.0000 g | INTRAVENOUS | Status: AC
  Administered 2022-05-12: 2 g via INTRAVENOUS

## 2022-05-12 MED ORDER — HYDROMORPHONE HCL 1 MG/ML IJ SOLN: 0.2500 mg | INTRAMUSCULAR | Status: DC | PRN

## 2022-05-12 SURGICAL SUPPLY — 96 items
APL PRP STRL LF DISP 70% ISPRP (MISCELLANEOUS)
APL SKNCLS STERI-STRIP NONHPOA (GAUZE/BANDAGES/DRESSINGS)
BAND INSRT 18 STRL LF DISP RB (MISCELLANEOUS)
BAND RUBBER #18 3X1/16 STRL (MISCELLANEOUS) IMPLANT
BENZOIN TINCTURE PRP APPL 2/3 (GAUZE/BANDAGES/DRESSINGS) IMPLANT
BLADE CLIPPER SENSICLIP SURGIC (BLADE) IMPLANT
BLADE SURG 15 STRL LF DISP TIS (BLADE) ×4 IMPLANT
BLADE SURG 15 STRL SS (BLADE) ×6
CANISTER SUCT 1200ML W/VALVE (MISCELLANEOUS) IMPLANT
CATH ROBINSON RED A/P 16FR (CATHETERS) ×3 IMPLANT
CHLORAPREP W/TINT 26 (MISCELLANEOUS) ×2 IMPLANT
COVER BACK TABLE 60X90IN (DRAPES) ×3 IMPLANT
COVER MAYO STAND STRL (DRAPES) ×3 IMPLANT
DEPRESSOR TONGUE BLADE STERILE (MISCELLANEOUS) ×3 IMPLANT
DRAIN JP 10F RND SILICONE (MISCELLANEOUS) IMPLANT
DRAPE 3/4 80X56 (DRAPES) IMPLANT
DRAPE LAPAROTOMY 100X72 PEDS (DRAPES) IMPLANT
DRAPE U-SHAPE 76X120 STRL (DRAPES) IMPLANT
DRAPE UTILITY XL STRL (DRAPES) ×3 IMPLANT
DRSG TELFA 3X8 NADH (GAUZE/BANDAGES/DRESSINGS) IMPLANT
ELECT BALL LEEP 3MM BLK (ELECTRODE) IMPLANT
ELECT COATED BLADE 2.86 ST (ELECTRODE) IMPLANT
ELECT NDL BLADE 2-5/6 (NEEDLE) ×2 IMPLANT
ELECT NEEDLE BLADE 2-5/6 (NEEDLE) ×3 IMPLANT
ELECT REM PT RETURN 9FT ADLT (ELECTROSURGICAL)
ELECT REM PT RETURN 9FT PED (ELECTROSURGICAL)
ELECTRODE REM PT RETRN 9FT PED (ELECTROSURGICAL) IMPLANT
ELECTRODE REM PT RTRN 9FT ADLT (ELECTROSURGICAL) IMPLANT
EVACUATOR SILICONE 100CC (DRAIN) IMPLANT
GAUZE 4X4 16PLY ~~LOC~~+RFID DBL (SPONGE) ×6 IMPLANT
GAUZE SPONGE 4X4 12PLY STRL LF (GAUZE/BANDAGES/DRESSINGS) IMPLANT
GAUZE XEROFORM 1X8 LF (GAUZE/BANDAGES/DRESSINGS) IMPLANT
GLOVE BIO SURGEON STRL SZ 6 (GLOVE) ×6 IMPLANT
GLOVE BIOGEL M STRL SZ7.5 (GLOVE) ×3 IMPLANT
GLOVE BIOGEL PI IND STRL 8 (GLOVE) ×2 IMPLANT
GLOVE BIOGEL PI INDICATOR 8 (GLOVE) ×1
GOWN STRL REUS W/ TWL LRG LVL3 (GOWN DISPOSABLE) ×4 IMPLANT
GOWN STRL REUS W/TWL LRG LVL3 (GOWN DISPOSABLE) ×9 IMPLANT
KIT TURNOVER CYSTO (KITS) ×3 IMPLANT
NDL HYPO 25X1 1.5 SAFETY (NEEDLE) ×2 IMPLANT
NDL HYPO 27GX1-1/4 (NEEDLE) ×2 IMPLANT
NDL HYPO 30GX1 BEV (NEEDLE) IMPLANT
NDL SPNL 18GX3.5 QUINCKE PK (NEEDLE) IMPLANT
NEEDLE HYPO 25X1 1.5 SAFETY (NEEDLE) ×3 IMPLANT
NEEDLE HYPO 27GX1-1/4 (NEEDLE) ×3 IMPLANT
NEEDLE HYPO 30GX1 BEV (NEEDLE) IMPLANT
NEEDLE SPNL 18GX3.5 QUINCKE PK (NEEDLE) IMPLANT
NS IRRIG 1000ML POUR BTL (IV SOLUTION) IMPLANT
NS IRRIG 500ML POUR BTL (IV SOLUTION) ×3 IMPLANT
PACK BASIN DAY SURGERY FS (CUSTOM PROCEDURE TRAY) ×3 IMPLANT
PACK PERINEAL COLD (PAD) ×3 IMPLANT
PACK VAGINAL WOMENS (CUSTOM PROCEDURE TRAY) ×3 IMPLANT
PAD DRESSING TELFA 3X8 NADH (GAUZE/BANDAGES/DRESSINGS) IMPLANT
PAD PREP 24X48 CUFFED NSTRL (MISCELLANEOUS) ×3 IMPLANT
PENCIL BUTTON HOLSTER BLD 10FT (ELECTRODE) ×3 IMPLANT
PENCIL SMOKE EVACUATOR (MISCELLANEOUS) ×3 IMPLANT
PUNCH BIOPSY DERMAL 3 (INSTRUMENTS) IMPLANT
PUNCH BIOPSY DERMAL 3MM (INSTRUMENTS)
PUNCH BIOPSY DERMAL 4MM (INSTRUMENTS) IMPLANT
SLEEVE SCD COMPRESS KNEE MED (STOCKING) IMPLANT
SPONGE GAUZE 2X2 8PLY STRL LF (GAUZE/BANDAGES/DRESSINGS) IMPLANT
SPONGE T-LAP 18X18 ~~LOC~~+RFID (SPONGE) IMPLANT
STAPLER VISISTAT 35W (STAPLE) ×3 IMPLANT
STRIP CLOSURE SKIN 1/2X4 (GAUZE/BANDAGES/DRESSINGS) IMPLANT
SUCTION FRAZIER HANDLE 10FR (MISCELLANEOUS)
SUCTION TUBE FRAZIER 10FR DISP (MISCELLANEOUS) IMPLANT
SUT ETHILON 4 0 PS 2 18 (SUTURE) IMPLANT
SUT MNCRL AB 4-0 PS2 18 (SUTURE) IMPLANT
SUT MON AB 5-0 P3 18 (SUTURE) IMPLANT
SUT PDS 3-0 CT2 (SUTURE)
SUT PDS II 3-0 CT2 27 ABS (SUTURE) IMPLANT
SUT PLAIN 5 0 P 3 18 (SUTURE) IMPLANT
SUT PROLENE 5 0 P 3 (SUTURE) IMPLANT
SUT VIC AB 0 CT1 36 (SUTURE) IMPLANT
SUT VIC AB 0 SH 27 (SUTURE) ×3 IMPLANT
SUT VIC AB 2-0 CT2 27 (SUTURE) IMPLANT
SUT VIC AB 2-0 SH 27 (SUTURE) ×3
SUT VIC AB 2-0 SH 27XBRD (SUTURE) IMPLANT
SUT VIC AB 3-0 PS2 18 (SUTURE)
SUT VIC AB 3-0 PS2 18XBRD (SUTURE) IMPLANT
SUT VIC AB 3-0 SH 27 (SUTURE) ×3
SUT VIC AB 3-0 SH 27X BRD (SUTURE) ×2 IMPLANT
SUT VIC AB 4-0 PS2 18 (SUTURE) ×3 IMPLANT
SUT VICRYL 0 UR6 27IN ABS (SUTURE) IMPLANT
SUT VICRYL 4-0 PS2 18IN ABS (SUTURE) IMPLANT
SWAB COLLECTION DEVICE MRSA (MISCELLANEOUS) IMPLANT
SWAB CULTURE ESWAB REG 1ML (MISCELLANEOUS) IMPLANT
SWAB OB GYN 8IN STERILE 2PK (MISCELLANEOUS) ×6 IMPLANT
SYR BULB EAR ULCER 3OZ GRN STR (SYRINGE) IMPLANT
SYR BULB IRRIG 60ML STRL (SYRINGE) ×3 IMPLANT
SYR CONTROL 10ML LL (SYRINGE) ×3 IMPLANT
TOWEL OR 17X26 10 PK STRL BLUE (TOWEL DISPOSABLE) ×6 IMPLANT
TRAY DSU PREP LF (CUSTOM PROCEDURE TRAY) IMPLANT
TUBE CONNECTING 12X1/4 (SUCTIONS) IMPLANT
VACUUM HOSE 7/8X10 W/ WAND (MISCELLANEOUS) IMPLANT
WATER STERILE IRR 500ML POUR (IV SOLUTION) ×3 IMPLANT

## 2022-05-12 NOTE — Interval H&P Note (Signed)
History and Physical Interval Note:  05/12/2022 11:40 AM  Danielle Obrien  has presented today for surgery, with the diagnosis of HIGH GRADE VULVAR DYSPLASIA.  The various methods of treatment have been discussed with the patient and family. After consideration of risks, benefits and other options for treatment, the patient has consented to  Procedure(s): CO2 LASER APPLICATION TO VULVA; POSSIBLE CO2 LASER APPLICATION TO THE VAGINA (N/A) WIDE EXCISION VULVECTOMY (N/A) POSSIBLE VULVAR BIOPSY; POSSIBLE VAGINAL BIOPSY (N/A) EXCION OF RIGHT CHEEK CYST (Right) as a surgical intervention.  The patient's history has been reviewed, patient examined, no change in status, stable for surgery.  I have reviewed the patient's chart and labs.  Questions were answered to the patient's satisfaction.     Lafonda Mosses

## 2022-05-12 NOTE — Op Note (Signed)
Operative Note   DATE OF OPERATION: 05/12/2022  SURGICAL DEPARTMENT: Plastic Surgery  PREOPERATIVE DIAGNOSES: Right cheek cyst  POSTOPERATIVE DIAGNOSES:  same  PROCEDURE: 1.  Excision right cheek cyst 2 cm 2.  Complex closure right cheek totaling 2 cm  SURGEON: Talmadge Coventry, MD  ASSISTANT: Verdie Shire, PA The advanced practice practitioner (APP) assisted throughout the case.  The APP was essential in retraction and counter traction when needed to make the case progress smoothly.  This retraction and assistance made it possible to see the tissue planes for the procedure.  The assistance was needed for hemostasis, tissue re-approximation and closure of the incision site.   ANESTHESIA:  General.   COMPLICATIONS: None.   INDICATIONS FOR PROCEDURE:  The patient, Danielle Obrien is a 41 y.o. female born on 1981/10/19, is here for treatment of right cheek cyst MRN: [id number]  CONSENT:  Informed consent was obtained directly from the patient. Risks, benefits and alternatives were fully discussed. Specific risks including but not limited to bleeding, infection, hematoma, seroma, scarring, pain, contracture, asymmetry, wound healing problems, and need for further surgery were all discussed. The patient did have an ample opportunity to have questions answered to satisfaction.   DESCRIPTION OF PROCEDURE:  The patient was taken to the operating room. SCDs were placed and antibiotics were given.  General anesthesia was administered.  The patient's operative site was prepped and draped in a sterile fashion. A time out was performed and all information was confirmed to be correct.  Started by marking out the borders of the cyst.  Lidocaine with epinephrine was injected.  An elliptical incision was made with a 15 blade.  Identified the cyst wall and excised it using a 15 blade.  Hemostasis was obtained.  Surrounding skin was then undermined and advanced and closed in layers with interrupted  buried 5-0 Monocryl and a running 5-0 plain gut suture.  Band-Aid was applied.  The patient tolerated the procedure well.  There were no complications. The patient was allowed to wake from anesthesia, extubated and taken to the recovery room in satisfactory condition.

## 2022-05-12 NOTE — Op Note (Signed)
PATIENT: Danielle Obrien DATE: 05/12/22  Preop Diagnosis: VIN3, history of vaginal dysplasia  Postoperative Diagnosis: same as above  Surgery: CO2 laser of the vulva, vulvar biopsies, wide local excision of the vulva  Surgeons:  Valarie Cones, MD  Assistant: none  Anesthesia: General   Estimated blood loss: 86m  IVF:  see I&O flowsheet   Urine output: n/a   Complications: None apparent  Pathology: vaginal cuff biopsy, anterior left vulvar excision stitch at 12, posterior fourchette, perineal condyloma 5 o'c, right mons lesion  Operative findings: On vaginal inspection, one area of acetowhite after application of acetic acid (along cuff, anterior aspect). Acetowhite with small area of ulceration on anterior left vulva, lateral to the clitoris. Multiple condyloma (1-3437m along perineum and posterior vulva. Area of acetowhite at the posterior fourchette, approximately 1 cm.  Several small condyloma ([id number] bilateral labia majora. Approximately 4 x4 cm area of acetowhite and areas of scabbed skin over the right lateral mons.   Procedure: The patient was identified in the preoperative holding area. Informed consent was signed on the chart. Patient was seen history was reviewed and exam was performed.   The patient was then taken to the operating room and placed in the supine position with SCD hose on. General anesthesia was then induced without difficulty. She was then placed in the dorsolithotomy position. The perineum was prepped with Betadine. The vagina was prepped with Betadine. The patient was then draped after the prep was dried. A Foley catheter was inserted into the bladder under sterile conditions to drain the bladder then removed.  Timeout was performed the patient, procedure, antibiotic, allergy, and length of procedure. 5% acetic acid solution was applied to the perineum. The vulvar tissues were inspected for areas of acetowhite changes or leukoplakia. The lesion  to be excised was identified and the marking pen was used to circumscribe the area with appropriate surgical margins. The subcuticular tissues were infiltrated with 0.25% marcaine and planned excision and at all planned sites of laser ablation.   The 15 blade scalpel was used to make an incision through the skin circumferentially as marked. The skin elipse was grasped and was separated from the underlying deep dermal tissues with the bovie device. After the specimen had been completely resected, it was oriented and marked at 12 o'clock with a 0-vicryl suture. The bovie was used to obtain hemostasis at the surgical bed. The subcutaneous tissues were irrigated and made hemostatic.   The deep dermal layer was approximated with 2-0 and 3-0vicryl mattress sutures to bring the skin edges into approximation and off tension. The wound was closed following langher's lines. The cutaneous layer was closed with interrupted 4-0 vicryl stitches and mattress sutures to ensure a tension free and hemostatic closure.   The patient's surgical field was draped with wet towels. The staff and patient ensured laser-safe eyewear and masks were fitted. The laser was set to 8 watts continuous. The laser was tested for accuracy on a tongue depressor.  The laser was applied to the circumscribed area of the vulva that had been previously identified (condyloma along labia and perineum, posterior fourchette, and right lateral mons). The tissue was ablated to the desired depth and the eschar was removed with a moistened sponge. When the entire lesion had been ablated the procedure was complete.  Silvadine cream was applied to the laser site.  All instrument, suture, laparotomy, Ray-Tec, and needle counts were correct x2. The patient tolerated the procedure well and was taken recovery  room in stable condition.   Jeral Pinch MD Gynecologic Oncology

## 2022-05-12 NOTE — Discharge Instructions (Addendum)
AFTER SURGERY INSTRUCTIONS   You can restart your coumadin the night of your surgery (6/13).  Return to work:  4-6 weeks if applicable  We recommend purchasing several bags of frozen green peas and dividing them into ziploc bags. You will want to keep these in the freezer and have them ready to use as ice packs to the vulvar incision. Once the ice pack is no longer cold, you can get another from the freezer. The frozen peas mold to your body better than a regular ice pack.     Activity: 1. Be up and out of the bed during the day.  Take a nap if needed.  You may walk up steps but be careful and use the hand rail.  Stair climbing will tire you more than you think, you may need to stop part way and rest.    2. No lifting or straining for 4 weeks minimum over 10 pounds. No pushing, pulling, straining for 4 weeks.   3. No driving for minimum 24 hours after surgery.  Do not drive if you are taking narcotic pain medicine and make sure that your reaction time has returned.    4. You can shower as soon as the next day after surgery. Shower daily. No tub baths or submerging your body in water until cleared by your surgeon. If you have the soap that was given to you by pre-surgical testing that was used before surgery, you do not need to use it afterwards because this can irritate your incisions.    5. No sexual activity and nothing in the vagina for 4 weeks minimum.   6. You may experience vulvar/ vaginal spotting and discharge after surgery.  The spotting is normal but if you experience heavy bleeding, call our office.   7. Take Tylenol first for pain if you are able to take these medications and only use narcotic pain medication for severe pain not relieved by the Tylenol.  Monitor your Tylenol intake to a max of 4,000 mg in a 24 hour period. You have been prescribed oxycodone which you can use for breakthrough pain during the day. Do not take at the same time as tramadol.    Diet: 1. Low sodium  Heart Healthy Diet is recommended but you are cleared to resume your normal (before surgery) diet after your procedure.   2. It is safe to use a laxative, such as Miralax or Colace, if you have difficulty moving your bowels. You will be prescribed Sennakot at bedtime every evening to keep bowel movements regular and to prevent constipation.     Wound Care: 1. Keep clean and dry.  Shower daily.   Reasons to call the Doctor: Fever - Oral temperature greater than 100.4 degrees Fahrenheit Foul-smelling vaginal discharge Difficulty urinating Nausea and vomiting Increased pain at the site of the incision that is unrelieved with pain medicine. Difficulty breathing with or without chest pain New calf pain especially if only on one side Sudden, continuing increased vaginal bleeding with or without clots.   Contacts: For questions or concerns you should contact:   Dr. Jeral Pinch at [phone]   [name], NP at [phone]   After Hours: call [phone] and have the GYN Oncologist paged/contacted (after 5 pm or on the weekends).   Messages sent via mychart are for non-urgent matters and are not responded to after hours so for urgent needs, please call the after hours number.   For your cheek cyst:  Change bandaid as needed,  may leave uncovered after 24 hours if you would like.  Contact Dr. Claudia Desanctis office [phone] if you have any questions.  Post Anesthesia Home Care Instructions  Activity: Get plenty of rest for the remainder of the day. A responsible individual must stay with you for 24 hours following the procedure.  For the next 24 hours, DO NOT: -Drive a car -Paediatric nurse -Drink alcoholic beverages -Take any medication unless instructed by your physician -Make any legal decisions or sign important papers.  Meals: Start with liquid foods such as gelatin or soup. Progress to regular foods as tolerated. Avoid greasy, spicy, heavy foods. If nausea and/or  vomiting occur, drink only clear liquids until the nausea and/or vomiting subsides. Call your physician if vomiting continues.  Special Instructions/Symptoms: Your throat may feel dry or sore from the anesthesia or the breathing tube placed in your throat during surgery. If this causes discomfort, gargle with warm salt water. The discomfort should disappear within 24 hours.  If you had a scopolamine patch placed behind your ear for the management of post- operative nausea and/or vomiting:  1. The medication in the patch is effective for 72 hours, after which it should be removed.  Wrap patch in a tissue and discard in the trash. Wash hands thoroughly with soap and water. 2. You may remove the patch earlier than 72 hours if you experience unpleasant side effects which may include dry mouth, dizziness or visual disturbances. 3. Avoid touching the patch. Wash your hands with soap and water after contact with the patch.

## 2022-05-12 NOTE — Transfer of Care (Signed)
Immediate Anesthesia Transfer of Care Note  Patient: Danielle Obrien  Procedure(s) Performed: CO2 LASER APPLICATION TO VULVA WIDE EXCISION VULVECTOMY POSSIBLE VULVAR BIOPSY; POSSIBLE VAGINAL BIOPSY EXCION OF RIGHT CHEEK CYST (Right)  Patient Location: PACU  Anesthesia Type:General  Level of Consciousness: awake, alert , oriented and patient cooperative  Airway & Oxygen Therapy: Patient Spontanous Breathing  Post-op Assessment: Report given to RN and Post -op Vital signs reviewed and stable  Post vital signs: Reviewed and stable  Last Vitals:  Vitals Value Taken Time  BP 174/92 05/12/22 1256  Temp    Pulse 79 05/12/22 1300  Resp 19 05/12/22 1300  SpO2 95 % 05/12/22 1300  Vitals shown include unvalidated device data.  Last Pain:  Vitals:   05/12/22 1034  TempSrc: Oral  PainSc: 0-No pain      Patients Stated Pain Goal: 6 (76/16/07 3710)  Complications: No notable events documented.

## 2022-05-12 NOTE — Anesthesia Preprocedure Evaluation (Addendum)
Anesthesia Evaluation  Patient identified by MRN, date of birth, ID band Patient awake    Reviewed: Allergy & Precautions, NPO status , Patient's Chart, lab work & pertinent test results  History of Anesthesia Complications Negative for: history of anesthetic complications  Airway Mallampati: II  TM Distance: >3 FB Neck ROM: Full    Dental  (+) Dental Advisory Given   Pulmonary COPD,  COPD inhaler, Current Smoker,    breath sounds clear to auscultation       Cardiovascular hypertension, Pt. on medications and Pt. on home beta blockers (-) angina+ Valvular Problems/Murmurs (s/p AVR)  Rhythm:Regular Rate:Normal + Systolic Click 12/3534 ECHO: EF 60-65%, normal LVF, normal RVF, mild MR, s/p mechanical AVR: no AS, no AI   Neuro/Psych  Headaches, Anxiety Bipolar Disorder Hard of hearing    GI/Hepatic Neg liver ROS, GERD  Medicated and Controlled,(+)     substance abuse  alcohol use,   Endo/Other  negative endocrine ROS  Renal/GU negative Renal ROS     Musculoskeletal  (+) Arthritis ,   Abdominal   Peds  Hematology Coumadin: INR 1.6 (6/8)   Anesthesia Other Findings   Reproductive/Obstetrics                           Anesthesia Physical Anesthesia Plan  ASA: 3  Anesthesia Plan: General   Post-op Pain Management: Tylenol PO (pre-op)*   Induction: Intravenous  PONV Risk Score and Plan: 2 and Ondansetron and Dexamethasone  Airway Management Planned: LMA  Additional Equipment: None  Intra-op Plan:   Post-operative Plan:   Informed Consent: I have reviewed the patients History and Physical, chart, labs and discussed the procedure including the risks, benefits and alternatives for the proposed anesthesia with the patient or authorized representative who has indicated his/her understanding and acceptance.     Dental advisory given  Plan Discussed with: CRNA and Surgeon  Anesthesia  Plan Comments:        Anesthesia Quick Evaluation

## 2022-05-12 NOTE — Anesthesia Procedure Notes (Signed)
Procedure Name: LMA Insertion Date/Time: 05/12/2022 12:00 PM  Performed by: Rogers Blocker, CRNAPre-anesthesia Checklist: Patient identified, Emergency Drugs available, Suction available and Patient being monitored Patient Re-evaluated:Patient Re-evaluated prior to induction Oxygen Delivery Method: Circle System Utilized Preoxygenation: Pre-oxygenation with 100% oxygen Induction Type: IV induction Ventilation: Mask ventilation without difficulty LMA: LMA inserted LMA Size: 4.0 Number of attempts: 1 Placement Confirmation: positive ETCO2 Tube secured with: Tape Dental Injury: Teeth and Oropharynx as per pre-operative assessment

## 2022-05-12 NOTE — Anesthesia Postprocedure Evaluation (Signed)
Anesthesia Post Note  Patient: Danielle Obrien  Procedure(s) Performed: CO2 LASER APPLICATION TO VULVA WIDE EXCISION VULVECTOMY POSSIBLE VULVAR BIOPSY; POSSIBLE VAGINAL BIOPSY EXCION OF RIGHT CHEEK CYST (Right)     Patient location during evaluation: Phase II Anesthesia Type: General Level of consciousness: awake and alert, patient cooperative and oriented Pain management: pain level controlled Vital Signs Assessment: post-procedure vital signs reviewed and stable Respiratory status: nonlabored ventilation, spontaneous breathing and respiratory function stable Cardiovascular status: blood pressure returned to baseline and stable Postop Assessment: no apparent nausea or vomiting, able to ambulate and adequate PO intake Anesthetic complications: no   No notable events documented.  Last Vitals:  Vitals:   05/12/22 1330 05/12/22 1415  BP: (!) 158/98 (!) 153/91  Pulse: 82 77  Resp: (!) 21 18  Temp:  36.6 C  SpO2: 98% 95%    Last Pain:  Vitals:   05/12/22 1415  TempSrc:   PainSc: 4                  Sherese Heyward,E. Janeisha Ryle

## 2022-05-12 NOTE — Interval H&P Note (Signed)
Patient seen and examined. Risks and benefits discussed. Proceed with surgery.

## 2022-05-13 ENCOUNTER — Telehealth: Payer: Self-pay

## 2022-05-13 ENCOUNTER — Other Ambulatory Visit: Payer: Self-pay | Admitting: Family Medicine

## 2022-05-13 ENCOUNTER — Encounter (HOSPITAL_BASED_OUTPATIENT_CLINIC_OR_DEPARTMENT_OTHER): Payer: Self-pay | Admitting: Gynecologic Oncology

## 2022-05-13 NOTE — Telephone Encounter (Signed)
Spoke with Ms. Lainez this morning. She states she has been snacking but has not had much to eat, she is planning on having eggs and toast shortly. She is drinking well. She reports stinging with urination. Educated on using peri-bottle with urination and patting dry.  She has not had a BM yet but is passing gas. She is taking senokot as prescribed and encouraged her to drink plenty of water. She denies fever or chills. She reports some spotting from the top of the vulva. Advised to monitor and notify the office for increased pain, bleeding, swelling, discharge or odor.  She rates her pain 4/10. Her pain is controlled with Tramadol twice daily and alternating tylenol and oxycodone. Patient reports spacing her pain medications out and at least waiting 2 hours in between doses. Advised to monitor tylenol intake to avoid taking more than 4,'000mg'$  in a 24 hour period. She is also using frozen peas to the vulva as well as her silvadene cream. Patient inquiring if she can use silvadene cream more than once daily as it provides relief. Provider notified.     Instructed to call office with any fever, chills, purulent drainage, uncontrolled pain or any other questions or concerns. Patient verbalizes understanding.   Pt aware of post op appointments as well as the office number [phone] and after hours number [phone] to call if she has any questions or concerns

## 2022-05-13 NOTE — Telephone Encounter (Signed)
Spoke with Danielle Obrien and notified her that per Dr. Berline Lopes she can use her silvadene cream TID. Patient verbalized understanding and happy with this. Instructed to call with any needs.

## 2022-05-14 ENCOUNTER — Telehealth: Payer: Self-pay | Admitting: *Deleted

## 2022-05-14 ENCOUNTER — Telehealth: Payer: Self-pay

## 2022-05-14 NOTE — Telephone Encounter (Signed)
Updated FMLA forms sent with new return date of 6 weeks instead of 4 weeks

## 2022-05-14 NOTE — Telephone Encounter (Signed)
Received call from [place] this morning. She reports the area on her mons is "leaking". It is just above her panty liner and soaking through her underwear. She is placing a paper towel over the site to absorb fluid. She reports the fluid is clear. Denies redness, swelling, warmth, odor, fever or chills. Advised patient that some drainage is expected, place an additional panty liner horizontally, or fold it in half to cover the affected area to absorb fluid. Patient also inquiring if her FMLA paperwork can be adjust from 4-6 weeks to 6-8 weeks in order to claim her short term disability. Advised patient the maximum amount of time our office is able to provide is 6 weeks. Patient is requesting her FMLA be adjusted to only say 6 weeks versus 4-6 weeks. Advised that our office will work on adjusting this and will refax the forms when they are completed. Patient verbalized understanding and is appreciative of assistance. Instructed to call with any needs.

## 2022-05-15 LAB — SURGICAL PATHOLOGY

## 2022-05-15 NOTE — Telephone Encounter (Signed)
Patient called following up on this refill. She is completely out.

## 2022-05-18 ENCOUNTER — Telehealth: Payer: Self-pay | Admitting: *Deleted

## 2022-05-18 ENCOUNTER — Other Ambulatory Visit: Payer: Self-pay | Admitting: Gynecologic Oncology

## 2022-05-18 DIAGNOSIS — G8918 Other acute postprocedural pain: Secondary | ICD-10-CM

## 2022-05-18 MED ORDER — LIDOCAINE 5 % EX OINT: 1.0000 | TOPICAL_OINTMENT | CUTANEOUS | 1 refills | Status: DC | PRN

## 2022-05-18 NOTE — Telephone Encounter (Signed)
Return the patient's call and she stated "I had surgery on 6/13 with Dr Berline Lopes. Since Saturday I have had bleeding with a foul odor. The the blood is dark in color. I am still using panty liners like the nurse said and changing them every 3-4 hours; not soaking them. I have had some sweating and diarrhea times 2. I have no fever. I have being using the peri bottle and ice packs, but the ice packs hurt. I have been using the tylenol and tramadol but the pain level is 14." Explained that the message would be gien to the both the nurse and provider and the office would call her back.

## 2022-05-18 NOTE — Telephone Encounter (Signed)
Following up with Danielle Obrien. Patient reports she is in a lot of pain and has been trying to stretch out using the oxycodone. She has not been sleeping well and is really uncomfortable.  She reports the area on her mons is causing the most discomfort. She has been using her peri-bottle after toileting and silvadene cream TID. Danielle John, NP notified.  Appointment scheduled for tomorrow 05/19/22 at 11am. Patient is in agreement of appointment date and time. Per NP topical lidocaine can be sent into patients pharmacy. Patient is agreeable to this and prefers the [facility] off [address]. Advised that we will work on sending this over and to call with any questions or concerns. Patient verbalized understanding.

## 2022-05-18 NOTE — Progress Notes (Signed)
See RN note.

## 2022-05-19 ENCOUNTER — Inpatient Hospital Stay: Payer: Managed Care, Other (non HMO) | Attending: Gynecologic Oncology | Admitting: Gynecologic Oncology

## 2022-05-19 ENCOUNTER — Other Ambulatory Visit: Payer: Self-pay

## 2022-05-19 ENCOUNTER — Encounter: Payer: Self-pay | Admitting: Gynecologic Oncology

## 2022-05-19 ENCOUNTER — Ambulatory Visit (INDEPENDENT_AMBULATORY_CARE_PROVIDER_SITE_OTHER): Payer: Managed Care, Other (non HMO)

## 2022-05-19 VITALS — BP 147/98 | HR 67 | Temp 98.2°F | Resp 18 | Ht 59.0 in | Wt 115.9 lb

## 2022-05-19 DIAGNOSIS — Z954 Presence of other heart-valve replacement: Secondary | ICD-10-CM

## 2022-05-19 DIAGNOSIS — G8918 Other acute postprocedural pain: Secondary | ICD-10-CM

## 2022-05-19 DIAGNOSIS — Q230 Congenital stenosis of aortic valve: Secondary | ICD-10-CM | POA: Diagnosis not present

## 2022-05-19 DIAGNOSIS — Z5181 Encounter for therapeutic drug level monitoring: Secondary | ICD-10-CM | POA: Diagnosis not present

## 2022-05-19 DIAGNOSIS — Z7901 Long term (current) use of anticoagulants: Secondary | ICD-10-CM

## 2022-05-19 DIAGNOSIS — Q231 Congenital insufficiency of aortic valve: Secondary | ICD-10-CM

## 2022-05-19 DIAGNOSIS — I350 Nonrheumatic aortic (valve) stenosis: Secondary | ICD-10-CM

## 2022-05-19 DIAGNOSIS — D071 Carcinoma in situ of vulva: Secondary | ICD-10-CM

## 2022-05-19 LAB — POCT INR: INR: 2 (ref 2.0–3.0)

## 2022-05-19 MED ORDER — LIDOCAINE 5 % EX OINT: 1.0000 | TOPICAL_OINTMENT | CUTANEOUS | 1 refills | Status: DC | PRN

## 2022-05-19 MED ORDER — OXYCODONE HCL 5 MG PO TABS: 5.0000 mg | ORAL_TABLET | ORAL | 0 refills | Status: DC | PRN

## 2022-05-19 NOTE — Patient Instructions (Signed)
There is no sign of infection on today's exam. Recommend continuing with peri care and patting the area dry. Would continue using the silvadene cream on the vulva. You can increase the use of the lidocaine ointment and apply a thick layer on the open area on the mons. If you are able to have times during the day when you can let air get to the area that will assist with healing.   Keep a thermometer close so when you are having the sweating episodes, you can keep a log and make sure you do not have a temperature.  Please call the office if you do not feel you are improving or for any new symptoms, concerns.

## 2022-05-19 NOTE — Patient Instructions (Signed)
*  PLEASE PUT ON APPT NO ONSITE INTERPRETER NEEDED*   Continue 1 tablet daily except for 1.5 tablets on Sundays and Thursdays.  Recheck INR in 4 weeks. [facility] [phone]

## 2022-05-21 ENCOUNTER — Telehealth: Payer: Self-pay | Admitting: Gynecologic Oncology

## 2022-05-21 ENCOUNTER — Encounter: Payer: Managed Care, Other (non HMO) | Admitting: Surgical

## 2022-05-21 DIAGNOSIS — G8918 Other acute postprocedural pain: Secondary | ICD-10-CM | POA: Insufficient documentation

## 2022-05-21 NOTE — Progress Notes (Signed)
GYN Oncology Symptom Management  Danielle Obrien is a 41 year old female initially seen in office on May 2023 for multifocal high grade vulvar dysplasia. She is s/p CO2 laser of the vulva, vulvar biopsies, wide local excision of the vulva with Dr. Jeral Pinch on 05/12/22. Final pathology returned with: A.   CYST, RIGHT CHEEK, EXCISION:  -    Epidermal inclusion cyst.  -    Solar elastosis.   B.   VULVA, LEFT ANTERIOR, VULVECTOMY:  -    High-grade squamous intraepithelial lesion (HSIL/VIN 3).  -    Margins involved by VIN-3; 9-3 o'clock quadrants.   C.   VAGINAL CUFF, BIOPSY:  -    Focal hyperkeratosis.  -    No dysplasia or malignancy identified.   D.   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   E.   CONDYLOMA, PERINEAL, EXCISION:  -    High-grade squamous intraepithelial lesion (HSIL/VIN 3), with  condylomatous features and   involvement of resection margin.   F.   MONS LESION, RIGHT, BIOPSY:  -    High-grade squamous intraepithelial lesion (HSILVIN-3) in a  condyloma acuminatum.   She presents to the office today for further evaluation of increased pain post-operatively. She reports period level bleeding that started on Friday evening into Saturday that has lessened. The pain in the vulva worsened on Saturday. Denies fever, chills but has intermittent sweating. She states the oxycodone helped with the discomfort. She was alternating the oxycodone with tramadol every 2-3 hours. She has used the topical lidocaine ointment sent in 6/21 and states this helps. She is also using the silvadene cream. She is eating and tolerating her diet. No significant drainage reported. She is using a peri bottle to rinse the area. The majority of her pain is located on the lasered area on the mons. Mother in the room with patient with sign language interpreter.   Exam: Alert, oriented, appears uncomfortable. Lungs  clear. Heart regular in rate and rhythm. Incision on the left vulva is intact without drainage or erythema. Lasered areas on the vulva healing without surrounding erythema, fluctuance, or abnormal drainage.  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 We discussed pain regimen and refill given on oxycodone. She is advised to use the lidocaine ointment along with the silvadene cream to the lasered areas. Peri care discussed. She is advised to monitor her temperature and to call for any new and/or worsening symptoms, questions, or concerns. She is advised to follow up as planned or sooner if needed.

## 2022-05-21 NOTE — Telephone Encounter (Signed)
Called patient. Discussed biopsy results from surgery.  Discussed again where laser was performed.  Also reviewed partial positive margin from the resection.  We will see how this looks after she heals.  While we could certainly reexcise, may plan for close surveillance versus topical treatment.  Jeral Pinch MD Gynecologic Oncology

## 2022-05-25 ENCOUNTER — Other Ambulatory Visit: Payer: Self-pay | Admitting: Gynecologic Oncology

## 2022-05-27 ENCOUNTER — Encounter: Payer: Self-pay | Admitting: Surgical

## 2022-05-27 ENCOUNTER — Ambulatory Visit (INDEPENDENT_AMBULATORY_CARE_PROVIDER_SITE_OTHER): Payer: Managed Care, Other (non HMO) | Admitting: Surgical

## 2022-05-27 DIAGNOSIS — L723 Sebaceous cyst: Secondary | ICD-10-CM

## 2022-05-27 DIAGNOSIS — L729 Follicular cyst of the skin and subcutaneous tissue, unspecified: Secondary | ICD-10-CM

## 2022-05-27 NOTE — Progress Notes (Signed)
41 year old female here for follow-up after excision of right cheek cyst with Dr. Claudia Desanctis on 05/12/2022.  Of note she underwent gynecological procedures at the same time with Dr. Berline Lopes.  Pathology of right cheek cyst showed epidermal inclusion cyst with solar elastosis. She presents today with sign language interpreter.  She reports she is doing well, has not had any issues.  On exam right cheek incision intact, healing well.  No erythema or cellulitic changes.  No swelling noted.  No tenderness noted with palpation.  Recommend keeping the area covered with sunscreen to prevent discoloration.  All of her questions were answered to her content with the use of interpreter.  There is no signs of infection on exam.  5-0 fast gut suture was removed, the majority of this was dissolved.

## 2022-06-05 ENCOUNTER — Other Ambulatory Visit: Payer: Self-pay | Admitting: Cardiology

## 2022-06-05 NOTE — Telephone Encounter (Signed)
Prescription refill request received for warfarin Lov: Monge, 05/08/2022 Next INR check: 7/20 Warfarin tablet strength: 7.'5mg'$    Refill sent.

## 2022-06-10 ENCOUNTER — Encounter: Payer: Self-pay | Admitting: Gynecologic Oncology

## 2022-06-11 ENCOUNTER — Ambulatory Visit: Payer: Managed Care, Other (non HMO) | Admitting: Obstetrics and Gynecology

## 2022-06-11 ENCOUNTER — Encounter: Payer: Self-pay | Admitting: Gynecologic Oncology

## 2022-06-11 ENCOUNTER — Other Ambulatory Visit: Payer: Self-pay

## 2022-06-11 ENCOUNTER — Inpatient Hospital Stay: Payer: Managed Care, Other (non HMO) | Attending: Gynecologic Oncology | Admitting: Gynecologic Oncology

## 2022-06-11 ENCOUNTER — Ambulatory Visit (INDEPENDENT_AMBULATORY_CARE_PROVIDER_SITE_OTHER): Payer: Managed Care, Other (non HMO)

## 2022-06-11 VITALS — BP 122/78 | Ht 59.0 in | Wt 124.0 lb

## 2022-06-11 VITALS — BP 137/84 | HR 79 | Temp 97.4°F | Resp 16 | Ht 59.0 in | Wt 118.8 lb

## 2022-06-11 DIAGNOSIS — D071 Carcinoma in situ of vulva: Secondary | ICD-10-CM

## 2022-06-11 DIAGNOSIS — Z23 Encounter for immunization: Secondary | ICD-10-CM | POA: Diagnosis not present

## 2022-06-11 DIAGNOSIS — Z7189 Other specified counseling: Secondary | ICD-10-CM

## 2022-06-11 DIAGNOSIS — B3731 Acute candidiasis of vulva and vagina: Secondary | ICD-10-CM

## 2022-06-11 DIAGNOSIS — B379 Candidiasis, unspecified: Secondary | ICD-10-CM

## 2022-06-11 DIAGNOSIS — Z9889 Other specified postprocedural states: Secondary | ICD-10-CM

## 2022-06-11 MED ORDER — FLUCONAZOLE 150 MG PO TABS: 150.0000 mg | ORAL_TABLET | Freq: Every day | ORAL | 0 refills | Status: AC

## 2022-06-11 NOTE — Progress Notes (Signed)
Patient in today for 2nd Gardasil injection.   Contraception: Hyst LMP: Hyst Last AEX: 02-05-22 with BS  Injection given in RD. Patient tolerated shot well.   Patient informed next injection due in about 4 months.  Advised patient, if not on birth control, to return for next injection with cycle.   Routed to provider for final review.  Encounter closed.

## 2022-06-11 NOTE — Patient Instructions (Signed)
It was good to see you today.  You are healing well from surgery.  I sent a one-time dose of yeast medication to your pharmacy.  Please call and let me know if those symptoms do not improve after taking it.  I will plan to see you in about 6 weeks.  Depending on how things look, we will probably repeat some biopsies to assure adequate treatment of your precancer.  Please remind my staff when they call you before the visit about anxiety medication for that.

## 2022-06-11 NOTE — Progress Notes (Signed)
Gynecologic Oncology Return Clinic Visit  06/11/22  Reason for Visit: follow-up after surgery, treatment discussion  Treatment History: Dysplasia history:  03/19/22: Vaginal cuff biopsy - VAIN1; left buttocks biopsy - VIN3, Perineal biopsy - VIN 2-3, mons biopsy - VIN 2-3. 01/2022 Vaginal pap - negative, HR HPV positive 12/2018: Pap - negative, HR HPV positive (negative for 16, 18 and 45) 05/2017: vaginal biopsies - one with benign squamous mucosa, other with VAIN I 04/2017: Vaginal pap - negative, HR HPV not detected. 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 Vaginal biopsies, one with VAIN 2, other VAIN 1. 10/2015: vaginal cuff biopsy: VAIN II.  Vulvar biopsies performed including mons and right labia, both showing condyloma. 08/2015: Vaginal pap - LSIL, HR HPV positive. 02/2014: Vaginal cuff biopsy showed condylomata acuminata, p16 negative for diffuse positive staining.  No malignancy or high-grade dysplasia.  Right labial biopsy shows condyloma. 01/2014: Vaginal pap - LSIL.  Reactive squamous cells present. HR HPV positive. 01/2009: Pathology from hysterectomy shows chronic mild inflammation of the cervix and endocervix, mesonephric hyperplasia.  No evidence of malignancy.  Secretory type endometrium. 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  Endo and ectocervical margins negative for mucosa but partially denuded.  Stromal invasion noted. 10/2008: Cervical biopsy at 6:00 shows HPV effect consistent with low-grade dysplasia.  ECC with 1 fragment of ectocervical/squamous metaplasia with low-grade intraepithelial lesion and focal changes consistent with CIN-2. 07/2020: ECC shows benign endocervix, no dysplasia. 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  Cervical biopsy at 1:00 showed squamous mucosa with koilocytic atypia, no dysplasia identified.  ECC shows  rare detached fragments of slightly dysplastic squamous mucosa with benign endocervical mucosa.  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  Significant improvement in her pain.  Notes 4 days of vaginal pain.  Denies any discharge or bleeding.  Continues to struggle with constipation, using Senokot.  Denies any urinary symptoms.  Past Medical/Surgical History: Past Medical History:  Diagnosis Date   ADD (attention deficit disorder)    Alcohol abuse, in remission 2012   per pt in remission since 2012   Anticoagulated on Coumadin    coumadin--- managed by cardiology/ coumadin clinic   Arthritis    In hips   Asthma    allergy induced, rare inhaler use   Auditory processing disorder    per pt very HOH, first language ASL   Bipolar disorder ([place])    Chronic left hip pain    Chronic vertigo    Complication of anesthesia    per pt due to auditory processing disorder pt is unable to hear when waking up and also gets very anxious   GAD (generalized anxiety disorder)    GERD (gastroesophageal reflux disease)    History of cervical dysplasia    History of condyloma acuminatum 2017   s/p laser ablation vulva   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   biopsy and CO2 laser ablation   HOH (hard of hearing)    per pt very HOH due to auditroy processing disorder   Hyperlipidemia    Hypertension    Muscle spasms of both lower extremities    both hips   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   23 mm Sorin [employer] [place] bileaflet mechanical valve via right  anterior mini thoracotomy   Vulvar dysplasia     Past Surgical History:  Procedure Laterality Date   ANTERIOR CRUCIATE LIGAMENT REPAIR  1993   AORTIC VALVE REPLACEMENT N/A 11/03/2017   Procedure: MINIMALLY INVASIVE AORTIC VALVE REPLACEMENT( MINI THORACOTOMY);  Surgeon: Rexene Alberts, MD;  Location:  [place];  Service: Open Heart Surgery;  Laterality: N/A;   CERVICAL BIOPSY  W/ LOOP ELECTRODE EXCISION  2010   CIN III w/extension to glands   CLOSED REDUCTION HIP DISLOCATION Left 1224   CO2 LASER APPLICATION N/A 07/24/36   Procedure: CO2 LASER APPLICATION TO VULVA;  Surgeon: Lafonda Mosses, MD;  Location: [facility];  Service: Gynecology;  Laterality: N/A;   COLPOSCOPY  10/30/2008   CIN I & II   COLPOSCOPY  07/31/2000   Neg. ECC   COLPOSCOPY  06/30/2001   CIN I   COLPOSCOPY  08/30/2004   ECC--atypia   COLPOSCOPY N/A 01/21/2016   Procedure: COLPOSCOPY with vaginal biopsy with CO 2 Laser of Vaginal and vulvar condyloma;  Surgeon: [name], MD;  Location: [facility];  Service: Gynecology;  Laterality: N/A;  [name] will be here 2/21 for 1115 case confirmed 01/16/15 - TS   HARDWARE REMOVAL Left 1992   Left hip   INGUINAL HERNIA REPAIR Bilateral    1981;  10982   LEFT AND RIGHT HEART CATHETERIZATION WITH CORONARY ANGIOGRAM N/A 05/18/2014   Procedure: LEFT AND RIGHT HEART CATHETERIZATION WITH CORONARY ANGIOGRAM;  Surgeon: Jettie Booze, MD;  Location[facility];  Service: Cardiovascular;  Laterality: N/A;   LESION REMOVAL Right 05/12/2022   Procedure: Maudie Mercury OF RIGHT CHEEK CYST;  Surgeon: Cindra Presume, MD;  Location: [place];  Service: Plastics;  Laterality: Right;   LYMPH NODE BIOPSY  1995   NASAL SEPTUM SURGERY  2002   ORIF HIP FRACTURE Left 1990   per pt and  took out growth plate in Right knee   ROBOTIC ASSISTED TOTAL HYSTERECTOMY  02/26/2009   '@[facility]'$    TEE WITHOUT CARDIOVERSION N/A 11/03/2017   Procedure: TRANSESOPHAGEAL ECHOCARDIOGRAM (TEE);  Surgeon: Rexene Alberts, MD;  Location: [place];  Service: Open Heart Surgery;  Laterality: N/A;   TONSILLECTOMY AND ADENOIDECTOMY  1990   TYMPANOSTOMY TUBE PLACEMENT Bilateral    x3  per pt last one [place] BIOPSY N/A 05/12/2022   Procedure: POSSIBLE VULVAR BIOPSY;  POSSIBLE VAGINAL BIOPSY;  Surgeon: Lafonda Mosses, MD;  Location: [facility];  Service: Gynecology;  Laterality: N/A;   VULVECTOMY N/A 05/12/2022   Procedure: WIDE EXCISION VULVECTOMY;  Surgeon: Lafonda Mosses, MD;  Location: [facility];  Service: Gynecology;  Laterality: N/A;    Family History  Problem Relation Age of Onset   Hyperlipidemia Mother    Hypertension Mother    Thyroid disease Mother    Hyperlipidemia Father    Hypertension Father    Migraines Father    Hypertension Brother    Hyperlipidemia Brother    Thyroid disease Maternal Grandmother    Thyroid disease Maternal Grandfather    Prostate cancer Maternal Grandfather    Colon cancer Paternal Great-grandmother     Social History   Socioeconomic History   Marital status: Single    Spouse name: Not on file   Number of children: Not on file   Years of education: Not on file   Highest education level: Not on file  Occupational History   Not on  file  Tobacco Use   Smoking status: Every Day    Years: 20.00    Types: Cigarettes   Smokeless tobacco: Never   Tobacco comments:    05-05-2022 Per pt smokes 10 cig per day  Vaping Use   Vaping Use: Never used  Substance and Sexual Activity   Alcohol use: No    Comment: in remission since 2012   Drug use: Yes    Types: Marijuana    Comment: 05-05-2022  per pt smokes on average 1/4oz per week   Sexual activity: Yes    Partners: Male    Birth control/protection: Surgical    Comment: Hysterectomy  Other Topics Concern   Not on file  Social History Narrative   Not on file   Social Determinants of Health   Financial Resource Strain: Low Risk  (12/10/2018)   Overall Financial Resource Strain (CARDIA)    Difficulty of Paying Living Expenses: Not hard at all  Food Insecurity: Unknown (12/10/2018)   Hunger Vital Sign    Worried About [place] in the Last Year: Patient refused    [place] in the Last Year:  Patient refused  Transportation Needs: Unknown (12/10/2018)   [place] - Transportation    Lack of Transportation (Medical): Patient refused    Lack of Transportation (Non-Medical): Patient refused  Physical Activity: Unknown (12/10/2018)   Exercise Vital Sign    Days of Exercise per Week: Patient refused    Minutes of Exercise per Session: Patient refused  Stress: Not on file  Social Connections: Unknown (12/10/2018)   Social Connection and Isolation Panel [NHANES]    Frequency of Communication with Friends and Family: Patient refused    Frequency of Social Gatherings with Friends and Family: Patient refused    Attends Religious Services: Patient refused    Marine scientist or Organizations: Patient refused    Attends Archivist Meetings: Patient refused    Marital Status: Patient refused    Current Medications:  Current Outpatient Medications:    albuterol (VENTOLIN HFA) 108 (90 Base) MCG/ACT inhaler, Inhale 2 puffs into the lungs every 6 (six) hours as needed for wheezing or shortness of breath., Disp: 1 each, Rfl: 3   aspirin EC 81 MG tablet, Take 81 mg by mouth daily. Swallow whole., Disp: , Rfl:    DULoxetine (CYMBALTA) 30 MG capsule, TAKE ONE CAPSULE BY MOUTH DAILY (Patient taking differently: 30 mg daily.), Disp: 30 capsule, Rfl: 3   fluconazole (DIFLUCAN) 150 MG tablet, Take 1 tablet (150 mg total) by mouth daily for 1 dose., Disp: 1 tablet, Rfl: 0   gabapentin (NEURONTIN) 300 MG capsule, TAKE TWO CAPSULES BY MOUTH THREE TIMES A DAY (Patient taking differently: Take 600 mg by mouth 3 (three) times daily. TAKE TWO CAPSULES BY MOUTH THREE TIMES A DAY), Disp: 540 capsule, Rfl: 0   lamoTRIgine (LAMICTAL) 200 MG tablet, TAKE TWO TABLETS BY MOUTH DAILY (Patient taking differently: Take 400 mg by mouth daily.), Disp: 180 tablet, Rfl: 0   lansoprazole (PREVACID) 15 MG capsule, Take 15 mg by mouth daily as needed (for heartburn or acid reflux)., Disp: , Rfl:    lidocaine  (XYLOCAINE) 5 % ointment, Apply 1 Application topically as needed for moderate pain (to the vulva)., Disp: 35.44 g, Rfl: 1   meclizine (ANTIVERT) 12.5 MG tablet, TAKE ONE TABLET BY MOUTH TWICE A DAY AS NEEDED FOR DIZZINESS (Patient taking differently: 2 (two) times daily as needed.), Disp: 20 tablet, Rfl:  0   metoprolol tartrate (LOPRESSOR) 25 MG tablet, Take 0.5 tablets (12.5 mg total) by mouth daily., Disp: 45 tablet, Rfl: 3   oxyCODONE (OXY IR/ROXICODONE) 5 MG immediate release tablet, Take 1 tablet (5 mg total) by mouth every 4 (four) hours as needed for severe pain. For AFTER surgery only, do not take and drive, do not take with tramadol, Disp: 15 tablet, Rfl: 0   pravastatin (PRAVACHOL) 20 MG tablet, Take 1 tablet (20 mg total) by mouth daily., Disp: 90 tablet, Rfl: 1   senna-docusate (SENOKOT-S) 8.6-50 MG tablet, Take 2 tablets by mouth at bedtime. For AFTER surgery, do not take if having diarrhea, Disp: 30 tablet, Rfl: 0   SSD 1 % cream, APPLY 1 APPLICAITION TOPICALLY DAILY, Disp: 50 g, Rfl: 0   tiZANidine (ZANAFLEX) 4 MG tablet, TAKE ONE TABLET BY MOUTH AT BEDTIME (Patient taking differently: Take 4 mg by mouth at bedtime. And per pt takes as needed once during the day), Disp: 90 tablet, Rfl: 0   traMADol (ULTRAM) 50 MG tablet, Take 2 tablets (100 mg total) by mouth 2 (two) times daily., Disp: 120 tablet, Rfl: 0   warfarin (COUMADIN) 7.5 MG tablet, TAKE ONE TO ONE AND ONE-HALF (1-1.5) TABLETS BY MOUTH ONCE DAILY AS DIRECTED BY COUMADIN CLINIC *30 DAYS SUPPLY*, Disp: 40 tablet, Rfl: 0  Review of Systems: + Constipation, pelvic pain, hot flashes, muscle pain/cramps, anxiety Denies appetite changes, fevers, chills, fatigue, unexplained weight changes. Denies hearing loss, neck lumps or masses, mouth sores, ringing in ears or voice changes. Denies cough or wheezing.  Denies shortness of breath. Denies chest pain or palpitations. Denies leg swelling. Denies abdominal distention, pain, blood in  stools, diarrhea, nausea, vomiting, or early satiety. Denies pain with intercourse, dysuria, frequency, hematuria or incontinence. Denies vaginal bleeding or vaginal discharge.   Denies joint pain, back pain. Denies itching, rash, or wounds. Denies dizziness, headaches, numbness or seizures. Denies swollen lymph nodes or glands, denies easy bruising or bleeding. Denies depression, confusion, or decreased concentration.  Physical Exam: BP 137/84 (BP Location: Left Arm, Patient Position: Sitting)   Pulse 79   Temp (!) 97.4 F (36.3 C) (Tympanic)   Resp 16   Ht '4\' 11"'$  (1.499 m)   Wt 118 lb 12.8 oz (53.9 kg)   SpO2 98%   BMI 23.99 kg/m  General: Alert, oriented, no acute distress. HEENT: Normocephalic, atraumatic, sclera anicteric. Chest: Unlabored breathing on room air. Abdomen: soft, nontender.  Normoactive bowel sounds.  No masses or hepatosplenomegaly appreciated.  Area along the right aspect of the mons or laser performed is mildly erythematous, scab covering part of it. Extremities: Grossly normal range of motion.  Warm, well perfused.  No edema bilaterally. GU: Multiple areas where laser performed overall healing well, mildly erythematous.  Excision site along left labia has healed almost completely, no visible leukoplakia or changes to skin color in this area.  On speculum exam, thick white discharge noted.  Laboratory & Radiologic Studies: A.   CYST, RIGHT CHEEK, EXCISION:  -    Epidermal inclusion cyst.  -    Solar elastosis.   B.   VULVA, LEFT ANTERIOR, VULVECTOMY:  -    High-grade squamous intraepithelial lesion (HSIL/VIN 3).  -    Margins involved by VIN-3; 9-3 o'clock quadrants.   C.   VAGINAL CUFF, BIOPSY:  -    Focal hyperkeratosis.  -    No dysplasia or malignancy identified.   D.   FOURCHETTE, POSTERIOR, BIOPSY:  -  Focal high-grade squamous intraepithelial lesion (HSIL/VIN 3) in a  condyloma   acuminatum.   E.   CONDYLOMA, PERINEAL, EXCISION:  -     High-grade squamous intraepithelial lesion (HSIL/VIN 3), with  condylomatous features and   involvement of resection margin.   F.   MONS LESION, RIGHT, BIOPSY:  -    High-grade squamous intraepithelial lesion (HSILVIN-3) in a  condyloma acuminatum.   Assessment & Plan: TINIA ORAVEC is a 41 y.o. 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  Patient is overall healing well.  Discussed continued expectations and restrictions.  Vaginal symptoms as well as discharge, I suspect that she has a yeast infection.  1 dose of Diflucan sent to her pharmacy.  In the setting of her extensive laser ablation and appearance of lesions on exam today, I would like her to see her again in 4-6 weeks for repeat exam and possible biopsies.  Specifically, I am somewhat concerned about the area lateral to the mons on the right.  This is an unusual area for there to be HPV related disease and it was quite an extensive area.  Patient was given a copy of her pathology report.  We discussed biopsy findings which show high-grade dysplasia.  Excision from the left labia with high-grade dysplasia, margin positive from 9-3 o'clock.  Discussed options for this would be repeat excision versus topical treatment versus close follow-up.  Given visibly negative margin, I favor close follow-up.  We will examine this area closely with vulvoscopy at her follow-up.  Patient requested something for anxiety before biopsies.  I have asked her to remind my clinic staff when they call her for meaningful use before her clinic visit so that I can send something in at that time.  22 minutes of total time was spent for this patient encounter, including preparation, face-to-face counseling with the patient and coordination of care, and documentation of the encounter.  Jeral Pinch, MD  Division of Gynecologic Oncology  Department of Obstetrics and Gynecology  [facility]

## 2022-06-17 ENCOUNTER — Other Ambulatory Visit: Payer: Self-pay | Admitting: Family Medicine

## 2022-06-18 ENCOUNTER — Ambulatory Visit (INDEPENDENT_AMBULATORY_CARE_PROVIDER_SITE_OTHER): Payer: Managed Care, Other (non HMO) | Admitting: *Deleted

## 2022-06-18 DIAGNOSIS — Q230 Congenital stenosis of aortic valve: Secondary | ICD-10-CM

## 2022-06-18 DIAGNOSIS — Q231 Congenital insufficiency of aortic valve: Secondary | ICD-10-CM | POA: Diagnosis not present

## 2022-06-18 DIAGNOSIS — I350 Nonrheumatic aortic (valve) stenosis: Secondary | ICD-10-CM

## 2022-06-18 DIAGNOSIS — Z954 Presence of other heart-valve replacement: Secondary | ICD-10-CM | POA: Diagnosis not present

## 2022-06-18 DIAGNOSIS — Z7901 Long term (current) use of anticoagulants: Secondary | ICD-10-CM | POA: Diagnosis not present

## 2022-06-18 LAB — POCT INR: INR: 3.2 — AB (ref 2.0–3.0)

## 2022-06-18 NOTE — Patient Instructions (Addendum)
Description   *PLEASE PUT ON APPT NO ONSITE INTERPRETER NEEDED*  Since you have already taken today's dose then hold tomorrow's dose then continue 1 tablet daily except for 1.5 tablets on Sundays and Thursdays. Recheck INR in 3 weeks. Coumadin Clinic [phone]

## 2022-06-23 ENCOUNTER — Telehealth: Payer: Self-pay | Admitting: *Deleted

## 2022-06-23 ENCOUNTER — Encounter: Payer: Self-pay | Admitting: *Deleted

## 2022-06-23 NOTE — Telephone Encounter (Signed)
Patient called requesting a return to work note. Patient also stated "I am having bleeding when I wipe. Then blood is red and it comes and goes. It is sore/tender and throbs. It is not red, swollen, or warm to the touch. I am still using the peri bottle."   Per patient request work note fax to her employer

## 2022-06-23 NOTE — Telephone Encounter (Signed)
Following up with patient. Per provider advised patient to continue using her peri bottle and make sure you are patting dry and not rubbing as this can be very irritating. Continue to monitor symptoms and notify the office for increasing pain, fever, redness,warmth or drainage. Patient verbalized understanding. She reports having a low grade temp briefly a few days ago of 100.8. It has not returned. Provider notified, advised to monitor.  Patient reports she is unsure if she needs to stop her warfarin before Dr. Berline Lopes performs biopsies on 07/23/22. Advised patient that RN will look into this and someone from the office will contact her tomorrow. Patient verbalized understanding.

## 2022-06-24 ENCOUNTER — Telehealth: Payer: Self-pay

## 2022-06-24 NOTE — Telephone Encounter (Signed)
Spoke with Danielle Obrien this morning regarding her warfarin. Per Dr. Berline Lopes patient does not need to stop warfarin prior to her appointment on 07/23/22. Patient verbalized understanding, no questions voiced.

## 2022-06-24 NOTE — Telephone Encounter (Signed)
Received call from [place]. She reports she is unable to get in her mychart and her work did not receive the return to work letter that was faxed yesterday. Cher is requesting the return to work note be re-faxed to [phone].  Return to work note successfully faxed to number above.   Instructed Deshunda to call with any needs.

## 2022-07-05 ENCOUNTER — Encounter: Payer: Self-pay | Admitting: Internal Medicine

## 2022-07-05 NOTE — Progress Notes (Signed)
Subjective:    Patient ID: Danielle Obrien, female    DOB: 06-Sep-1981, 41 y.o.   MRN: [id number]      HPI Danielle Obrien is here for  Chief Complaint  Patient presents with   Headache    Patient reports headache x 5 days but not sure if she has a migraine or if it's sinus pressure    Language interpreter is used.  Headache - x 3 weeks, this past week had to call out because the headache was so significant.  She has never had to call out for anything before.  The headache has worsened and so is the pressure.  She feels pressure and squeezing in her head, but all over her body.    She was not sure if the headache was a migraine or from sinus issues.   She states nasal congestion, sinus pressure, sometimes photophobia, cough which she thinks is related to allergies and head pain.  Her whole body feels pressure and hurts.    She has tried - tylenol sinus plus HA, tylenol - helping a little.  She does not take any NSAIDs because she is on warfarin.  She did not do well with steroids.  She has tried gabapentin and tizanidine for some of her body pain that started after her hip surgery  Some of her body pain that started after her hip surgery.      Medications and allergies reviewed with patient and updated if appropriate.  Current Outpatient Medications on File Prior to Visit  Medication Sig Dispense Refill   albuterol (VENTOLIN HFA) 108 (90 Base) MCG/ACT inhaler Inhale 2 puffs into the lungs every 6 (six) hours as needed for wheezing or shortness of breath. 1 each 3   aspirin EC 81 MG tablet Take 81 mg by mouth daily. Swallow whole.     DULoxetine (CYMBALTA) 30 MG capsule TAKE ONE CAPSULE BY MOUTH DAILY (Patient taking differently: 30 mg daily.) 30 capsule 3   gabapentin (NEURONTIN) 300 MG capsule TAKE TWO CAPSULES BY MOUTH THREE TIMES A DAY (Patient taking differently: Take 600 mg by mouth 3 (three) times daily. TAKE TWO CAPSULES BY MOUTH THREE TIMES A DAY) 540 capsule 0    lamoTRIgine (LAMICTAL) 200 MG tablet TAKE TWO TABLETS BY MOUTH DAILY (Patient taking differently: Take 400 mg by mouth daily.) 180 tablet 0   lansoprazole (PREVACID) 15 MG capsule Take 15 mg by mouth daily as needed (for heartburn or acid reflux).     lidocaine (XYLOCAINE) 5 % ointment Apply 1 Application topically as needed for moderate pain (to the vulva). 35.44 g 1   meclizine (ANTIVERT) 12.5 MG tablet TAKE ONE TABLET BY MOUTH TWICE A DAY AS NEEDED FOR DIZZINESS (Patient taking differently: 2 (two) times daily as needed.) 20 tablet 0   metoprolol tartrate (LOPRESSOR) 25 MG tablet Take 0.5 tablets (12.5 mg total) by mouth daily. 45 tablet 3   pravastatin (PRAVACHOL) 20 MG tablet Take 1 tablet (20 mg total) by mouth daily. 90 tablet 1   senna-docusate (SENOKOT-S) 8.6-50 MG tablet Take 2 tablets by mouth at bedtime. For AFTER surgery, do not take if having diarrhea 30 tablet 0   SSD 1 % cream APPLY 1 APPLICAITION TOPICALLY DAILY 50 g 0   tiZANidine (ZANAFLEX) 4 MG tablet TAKE ONE TABLET BY MOUTH AT BEDTIME (Patient taking differently: Take 4 mg by mouth at bedtime. And per pt takes as needed once during the day) 90 tablet 0   traMADol (ULTRAM) 50  MG tablet TAKE 2 TABLETS BY MOUTH TWICE A DAY 120 tablet 0   warfarin (COUMADIN) 7.5 MG tablet TAKE ONE TO ONE AND ONE-HALF (1-1.5) TABLETS BY MOUTH ONCE DAILY AS DIRECTED BY COUMADIN CLINIC *30 DAYS SUPPLY* 40 tablet 0   No current facility-administered medications on file prior to visit.    Review of Systems  Constitutional:  Negative for fever (low grade).  HENT:  Positive for congestion and sinus pressure. Negative for ear pain and sore throat.   Eyes:  Positive for photophobia (sometimes).  Respiratory:  Positive for cough (wtih allergies). Negative for shortness of breath and wheezing.   Gastrointestinal:  Positive for nausea.  Neurological:  Positive for headaches. Negative for dizziness, weakness, light-headedness and numbness.        Objective:   Vitals:   07/06/22 1455  BP: 136/72  Pulse: 74  Temp: 97.8 F (36.6 C)  SpO2: 98%   BP Readings from Last 3 Encounters:  07/06/22 136/72  06/11/22 137/84  06/11/22 122/78   Wt Readings from Last 3 Encounters:  07/06/22 123 lb (55.8 kg)  06/11/22 118 lb 12.8 oz (53.9 kg)  06/11/22 124 lb (56.2 kg)   Body mass index is 24.84 kg/m.    Physical Exam Constitutional:      General: She is not in acute distress.    Appearance: Normal appearance. She is not ill-appearing.  HENT:     Head: Normocephalic and atraumatic.     Comments: Maxillary sinuses tender    Right Ear: Tympanic membrane, ear canal and external ear normal.     Left Ear: Tympanic membrane, ear canal and external ear normal.     Mouth/Throat:     Mouth: Mucous membranes are moist.     Pharynx: No oropharyngeal exudate or posterior oropharyngeal erythema.  Eyes:     General: No scleral icterus.    Conjunctiva/sclera: Conjunctivae normal.  Cardiovascular:     Rate and Rhythm: Normal rate and regular rhythm.  Pulmonary:     Effort: Pulmonary effort is normal. No respiratory distress.     Breath sounds: Normal breath sounds. No wheezing or rales.  Musculoskeletal:     Cervical back: Neck supple. No rigidity or tenderness.  Lymphadenopathy:     Cervical: No cervical adenopathy.  Skin:    General: Skin is warm and dry.  Neurological:     Mental Status: She is alert.     Cranial Nerves: No cranial nerve deficit.     Sensory: No sensory deficit.     Motor: No weakness.            Assessment & Plan:    Sinus infection: Subacute Symptoms consistent with probable sinus infection +/- migraine Start Keflex 500 mg 3 times daily x10 days-she is on warfarin and sees the Coumadin nurse this week Continue Tylenol, Tylenol sinus Supportive measures Call if no improvement  Migraine, status migrainous: Subacute Probable sinus infection with possible migraine Nurtec 75 mg p.o. x1-1 sample  given for her to take today to see if that helps Continue Tylenol, Tylenol sinus which often helps her headaches and hopefully treating her sinus infection will help her overall headache/migraine Call if no improvement  Hypertension: Chronic Well controlled-not contributing to her symptoms Continue metoprolol 12.5 mg daily   See Problem List for Assessment and Plan of chronic medical problems.

## 2022-07-06 ENCOUNTER — Ambulatory Visit: Payer: Managed Care, Other (non HMO) | Admitting: Internal Medicine

## 2022-07-06 VITALS — BP 136/72 | HR 74 | Temp 97.8°F | Ht 59.0 in | Wt 123.0 lb

## 2022-07-06 DIAGNOSIS — J0100 Acute maxillary sinusitis, unspecified: Secondary | ICD-10-CM

## 2022-07-06 DIAGNOSIS — I10 Essential (primary) hypertension: Secondary | ICD-10-CM | POA: Diagnosis not present

## 2022-07-06 DIAGNOSIS — G43001 Migraine without aura, not intractable, with status migrainosus: Secondary | ICD-10-CM

## 2022-07-06 MED ORDER — CEPHALEXIN 500 MG PO CAPS: 500.0000 mg | ORAL_CAPSULE | Freq: Three times a day (TID) | ORAL | 0 refills | Status: AC

## 2022-07-06 NOTE — Patient Instructions (Addendum)
      Medications changes include :   keflex for possible sinus infection   Take the migraine medication as directed.   Your prescription(s) have been sent to your pharmacy.     Return if symptoms worsen or fail to improve.

## 2022-07-09 ENCOUNTER — Other Ambulatory Visit: Payer: Self-pay | Admitting: Cardiology

## 2022-07-09 ENCOUNTER — Other Ambulatory Visit: Payer: Self-pay | Admitting: Family Medicine

## 2022-07-09 ENCOUNTER — Ambulatory Visit (INDEPENDENT_AMBULATORY_CARE_PROVIDER_SITE_OTHER): Payer: Managed Care, Other (non HMO)

## 2022-07-09 ENCOUNTER — Other Ambulatory Visit: Payer: Self-pay | Admitting: Internal Medicine

## 2022-07-09 DIAGNOSIS — F3177 Bipolar disorder, in partial remission, most recent episode mixed: Secondary | ICD-10-CM

## 2022-07-09 DIAGNOSIS — Z954 Presence of other heart-valve replacement: Secondary | ICD-10-CM | POA: Diagnosis not present

## 2022-07-09 DIAGNOSIS — Q231 Congenital insufficiency of aortic valve: Secondary | ICD-10-CM | POA: Diagnosis not present

## 2022-07-09 DIAGNOSIS — F411 Generalized anxiety disorder: Secondary | ICD-10-CM

## 2022-07-09 DIAGNOSIS — I350 Nonrheumatic aortic (valve) stenosis: Secondary | ICD-10-CM

## 2022-07-09 DIAGNOSIS — Z7901 Long term (current) use of anticoagulants: Secondary | ICD-10-CM | POA: Diagnosis not present

## 2022-07-09 DIAGNOSIS — Z5181 Encounter for therapeutic drug level monitoring: Secondary | ICD-10-CM | POA: Diagnosis not present

## 2022-07-09 DIAGNOSIS — Q230 Congenital stenosis of aortic valve: Secondary | ICD-10-CM

## 2022-07-09 LAB — POCT INR: INR: 1.7 — AB (ref 2.0–3.0)

## 2022-07-09 NOTE — Patient Instructions (Signed)
*  PLEASE PUT ON APPT NO ONSITE INTERPRETER NEEDED*   continue 1 tablet daily except for 1.5 tablets on Sundays and Thursdays. Recheck INR in 3 weeks. [facility] [phone]; Antibiotic started 8/9-8/19

## 2022-07-10 ENCOUNTER — Telehealth: Payer: Self-pay

## 2022-07-10 NOTE — Telephone Encounter (Signed)
Pt states she is needing a rx refill for her lamoTRIgine (LAMICTAL) 200 MG tablet  LOV: 04/17/2021 with PCP LOV with other providers:  02/19/2022, 07/06/2022.

## 2022-07-13 NOTE — Telephone Encounter (Addendum)
Lvm for pt per providers request of " lamoTRIgine (LAMICTAL) 200 MG tablet" she would need to be seen for her refill request. I was able to get pt sched for 08/18/2022 @ 1:40pm.

## 2022-07-15 ENCOUNTER — Other Ambulatory Visit: Payer: Self-pay | Admitting: Internal Medicine

## 2022-07-15 DIAGNOSIS — F3177 Bipolar disorder, in partial remission, most recent episode mixed: Secondary | ICD-10-CM

## 2022-07-15 DIAGNOSIS — F411 Generalized anxiety disorder: Secondary | ICD-10-CM

## 2022-07-16 ENCOUNTER — Ambulatory Visit: Payer: Managed Care, Other (non HMO) | Admitting: Internal Medicine

## 2022-07-16 ENCOUNTER — Encounter: Payer: Self-pay | Admitting: Internal Medicine

## 2022-07-16 VITALS — BP 128/78 | HR 69 | Temp 97.9°F | Ht 59.0 in | Wt 121.0 lb

## 2022-07-16 DIAGNOSIS — I10 Essential (primary) hypertension: Secondary | ICD-10-CM | POA: Diagnosis not present

## 2022-07-16 DIAGNOSIS — E785 Hyperlipidemia, unspecified: Secondary | ICD-10-CM | POA: Diagnosis not present

## 2022-07-16 DIAGNOSIS — D696 Thrombocytopenia, unspecified: Secondary | ICD-10-CM | POA: Diagnosis not present

## 2022-07-16 DIAGNOSIS — F3177 Bipolar disorder, in partial remission, most recent episode mixed: Secondary | ICD-10-CM | POA: Diagnosis not present

## 2022-07-16 DIAGNOSIS — Z0001 Encounter for general adult medical examination with abnormal findings: Secondary | ICD-10-CM | POA: Diagnosis not present

## 2022-07-16 NOTE — Progress Notes (Unsigned)
Subjective:  Patient ID: Danielle Obrien, female    DOB: 1981-05-28  Age: 41 y.o. MRN: [id number]  CC: Annual Exam and Hyperlipidemia  History is obtained using a sign language interpreter.  HPI Danielle Obrien presents for a CPX and f/up -   She has had a normal mood recently.  She is tolerating lamotrigine well.  She denies anxiety, depression, or manic symptoms.  Outpatient Medications Prior to Visit  Medication Sig Dispense Refill   albuterol (VENTOLIN HFA) 108 (90 Base) MCG/ACT inhaler Inhale 2 puffs into the lungs every 6 (six) hours as needed for wheezing or shortness of breath. 1 each 3   aspirin EC 81 MG tablet Take 81 mg by mouth daily. Swallow whole.     cephALEXin (KEFLEX) 500 MG capsule Take 1 capsule (500 mg total) by mouth 3 (three) times daily for 10 days. 30 capsule 0   DULoxetine (CYMBALTA) 30 MG capsule TAKE ONE CAPSULE BY MOUTH DAILY (Patient taking differently: 30 mg daily.) 30 capsule 3   gabapentin (NEURONTIN) 300 MG capsule TAKE TWO CAPSULES BY MOUTH THREE TIMES A DAY 540 capsule 0   lamoTRIgine (LAMICTAL) 200 MG tablet TAKE TWO TABLETS BY MOUTH DAILY 60 tablet 0   lansoprazole (PREVACID) 15 MG capsule Take 15 mg by mouth daily as needed (for heartburn or acid reflux).     lidocaine (XYLOCAINE) 5 % ointment Apply 1 Application topically as needed for moderate pain (to the vulva). 35.44 g 1   meclizine (ANTIVERT) 12.5 MG tablet TAKE ONE TABLET BY MOUTH TWICE A DAY AS NEEDED FOR DIZZINESS (Patient taking differently: 2 (two) times daily as needed.) 20 tablet 0   metoprolol tartrate (LOPRESSOR) 25 MG tablet Take 0.5 tablets (12.5 mg total) by mouth daily. 45 tablet 3   senna-docusate (SENOKOT-S) 8.6-50 MG tablet Take 2 tablets by mouth at bedtime. For AFTER surgery, do not take if having diarrhea 30 tablet 0   SSD 1 % cream APPLY 1 APPLICAITION TOPICALLY DAILY 50 g 0   tiZANidine (ZANAFLEX) 4 MG tablet TAKE ONE TABLET BY MOUTH AT BEDTIME (Patient taking differently:  Take 4 mg by mouth at bedtime. And per pt takes as needed once during the day) 90 tablet 0   traMADol (ULTRAM) 50 MG tablet TAKE 2 TABLETS BY MOUTH TWICE A DAY 120 tablet 0   warfarin (COUMADIN) 7.5 MG tablet TAKE 1 TO 1.5 TABLETS BY MOUTH ONCE DAILY AS DIRECTED BY COUMADIN CLLINIC 40 tablet 0   pravastatin (PRAVACHOL) 20 MG tablet Take 1 tablet (20 mg total) by mouth daily. 90 tablet 1   No facility-administered medications prior to visit.    ROS Review of Systems  Constitutional:  Negative for chills, diaphoresis, fatigue and fever.  HENT: Negative.    Eyes: Negative.   Respiratory:  Negative for cough, chest tightness, shortness of breath and wheezing.   Cardiovascular:  Negative for chest pain, palpitations and leg swelling.  Gastrointestinal:  Negative for abdominal pain, diarrhea and nausea.  Endocrine: Negative.   Genitourinary: Negative.  Negative for difficulty urinating.  Musculoskeletal:  Positive for arthralgias and back pain. Negative for myalgias.  Skin: Negative.   Allergic/Immunologic: Negative.   Neurological: Negative.  Negative for dizziness, weakness, light-headedness and headaches.  Hematological:  Negative for adenopathy. Does not bruise/bleed easily.  Psychiatric/Behavioral: Negative.  Negative for behavioral problems, confusion, dysphoric mood and sleep disturbance. The patient is not nervous/anxious.     Objective:  BP 128/78 (BP Location: Left Arm, Patient Position:  Sitting, Cuff Size: Normal)   Pulse 69   Temp 97.9 F (36.6 C) (Oral)   Ht '4\' 11"'$  (1.499 m)   Wt 121 lb (54.9 kg)   SpO2 97%   BMI 24.44 kg/m   BP Readings from Last 3 Encounters:  07/16/22 128/78  07/06/22 136/72  06/11/22 137/84    Wt Readings from Last 3 Encounters:  07/16/22 121 lb (54.9 kg)  07/06/22 123 lb (55.8 kg)  06/11/22 118 lb 12.8 oz (53.9 kg)    Physical Exam Vitals reviewed.  HENT:     Nose: Nose normal.     Mouth/Throat:     Mouth: Mucous membranes are moist.   Eyes:     General: No scleral icterus.    Conjunctiva/sclera: Conjunctivae normal.  Cardiovascular:     Rate and Rhythm: Normal rate and regular rhythm.     Heart sounds: Murmur heard.     Systolic murmur is present with a grade of 1/6.  Pulmonary:     Effort: Pulmonary effort is normal.     Breath sounds: No stridor. No wheezing, rhonchi or rales.  Abdominal:     General: Abdomen is flat.     Palpations: There is no mass.     Tenderness: There is no abdominal tenderness. There is no guarding.     Hernia: No hernia is present.  Musculoskeletal:        General: Normal range of motion.     Cervical back: Neck supple.     Right lower leg: No edema.     Left lower leg: No edema.  Lymphadenopathy:     Cervical: No cervical adenopathy.  Skin:    General: Skin is warm and dry.  Neurological:     General: No focal deficit present.     Mental Status: She is alert.  Psychiatric:        Mood and Affect: Mood normal.        Behavior: Behavior normal.     Lab Results  Component Value Date   WBC 8.0 07/16/2022   HGB 15.3 (H) 07/16/2022   HCT 45.7 07/16/2022   PLT 178.0 07/16/2022   GLUCOSE 106 (H) 05/12/2022   CHOL 279 (H) 07/16/2022   TRIG 73.0 07/16/2022   HDL 82.00 07/16/2022   LDLDIRECT 171.0 01/27/2013   LDLCALC 182 (H) 07/16/2022   ALT 10 01/29/2022   AST 19 01/29/2022   NA 139 05/12/2022   K 4.2 05/12/2022   CL 103 05/12/2022   CREATININE 0.70 05/12/2022   BUN 7 05/12/2022   CO2 26 01/29/2022   TSH 0.50 07/16/2022   INR 1.7 (A) 07/09/2022   HGBA1C 5.3 12/22/2018    No results found.  Assessment & Plan:   Danielle Obrien was seen today for annual exam and hyperlipidemia.  Diagnoses and all orders for this visit:  Essential hypertension, benign- Her blood pressure is well controlled. -     CBC with Differential/Platelet; Future -     TSH; Future -     TSH -     CBC with Differential/Platelet  Hyperlipidemia with target LDL less than 130- She has not achieved  her LDL goal.  Will upgrade to a more potent statin. -     Lipid panel; Future -     TSH; Future -     TSH -     Lipid panel -     rosuvastatin (CRESTOR) 20 MG tablet; Take 1 tablet (20 mg total) by mouth daily.  Thrombocytopenia (  Danielle Obrien)- Her platelet count is normal now. -     Vitamin B12; Future -     CBC with Differential/Platelet; Future -     Folate; Future -     Folate -     CBC with Differential/Platelet -     Vitamin B12  Bipolar disorder, in partial remission, most recent episode mixed (Danielle Obrien)- Her mood is stable on the current dose of lamotrigine.  Her level is in the therapeutic range. -     Lamotrigine level; Future -     Lamotrigine level  Encounter for general adult medical examination with abnormal findings- Exam completed, labs reviewed, vaccines are up-to-date, cancer screenings are up-to-date, patient education was given.   I have discontinued Danielle Obrien's pravastatin. I am also having her start on rosuvastatin. Additionally, I am having her maintain her lansoprazole, meclizine, aspirin EC, metoprolol tartrate, albuterol, DULoxetine, tiZANidine, senna-docusate, lidocaine, SSD, traMADol, warfarin, gabapentin, and lamoTRIgine.  Meds ordered this encounter  Medications   rosuvastatin (CRESTOR) 20 MG tablet    Sig: Take 1 tablet (20 mg total) by mouth daily.    Dispense:  90 tablet    Refill:  1     Follow-up: Return in about 6 months (around 01/16/2023).  Danielle Calico, MD

## 2022-07-16 NOTE — Patient Instructions (Signed)
Thrombocytopenia Thrombocytopenia is a condition in which there are a low number of platelets in the blood. Platelets are also called thrombocytes. Platelets are parts of blood that stick together and form a clot to help the body stop bleeding after an injury. If you have too few platelets, your blood may have trouble clotting. This may cause you to bleed and bruise very easily. Some cases of thrombocytopenia are mild while others are more severe. What are the causes? This condition is caused by a low number of platelets in your blood. There are three main reasons for this: Your body not making enough platelets. This may be caused by: Bone marrow diseases. This include aplastic anemia, leukemia, and myelodysplastic anemia. Congenital thrombocytopenia. This is a condition that is passed from parent to child (inherited). Certain cancer treatments, including chemotherapy and radiation therapy. Infections from bacteria or viruses. Alcohol use disorder and alcoholism. Platelets not being released in the blood. This is called platelet sequestration and it can happen due to: An overactive spleen (hypersplenism). The spleen gathers up platelets from circulation, meaning that the platelets are not available to help with clotting your blood. The spleen can be enlarged because of scarring or other conditions. Gaucher disease. Your body destroying platelets too quickly. This may be caused by: An autoimmune disease that causes immune thrombocytopenia (ITP). ITP is sometimes associated with other autoimmune conditions such as lupus. Certain medicines, such as blood thinners. Certain blood clotting or bleeding disorders. Exposure to toxic chemicals, such as pesticides, lead, benzene, and arsenic. Pregnancy. What are the signs or symptoms? Symptoms of this condition are the result of poor blood clotting. They will vary depending on how low the platelet counts are. Symptoms may include: Bruising  easily. Bleeding from the mouth or nose. Heavy menstrual periods. Blood in the urine, stool (feces), or vomit. Purplish-red discolorations on the skin (purpura). A rash that looks like pinpoint, purplish-red spots (petechiae) on the lower legs. How is this diagnosed?  This condition may be diagnosed with blood tests and a physical exam. You may also have other tests, including: A sample of bone marrow (biopsy) may be removed to look for the original cells that make platelets. An ultrasound or CT scan of the abdomen to check for an enlarged spleen, enlarged lymph nodes, or liver problems. How is this treated? Treatment for this condition depends on the cause. Treatment may include: Treatment of another condition that is causing the low platelet count. Medicines to help protect your platelets from being destroyed. A replacement (transfusion) of platelets to stop or prevent bleeding. Surgery to remove the spleen. Follow these instructions at home: Medicines Take over-the-counter and prescription medicines only as told by your health care provider. Do not take any medicines that contain aspirin or NSAIDs, such as ibuprofen. These medicines increase your risk for dangerous bleeding. Activity Avoid activities that could cause injury or bruising, and follow instructions about how to prevent falls. Do not play contact sports. Ask your health care provider what activities are safe for you. Take extra care to protect yourself from burns when ironing or cooking. Take extra care not to cut yourself when you shave or when you use scissors, needles, knives, and other tools. General instructions  Check your skin and the inside of your mouth for bruising or bleeding as told by your health care provider. Wear a medical alert bracelet that says that you have a bleeding disorder. This can help you get the treatment you need in case of emergency. Check   your urine and stool for blood as told by your health  care provider. Do not drink alcohol. If you do drink alcohol, limit the amount that you drink. Minimize contact with toxic chemicals. Tell all your health care providers, including dental care providers and eye doctors, about your condition. Make sure to tell dental care providers before you have any procedure done, including dental cleanings. Keep all follow-up visits. This is important. Contact a health care provider if: You have unexplained bruising. You have new symptoms. You have symptoms that get worse. You have a fever. Get help right away if: You have severe bleeding from anywhere on your body. You have blood in your vomit, urine, or stool. You have an injury to your head. You have a sudden, severe headache. Summary Thrombocytopenia is a condition in which you have a low number of platelets in the blood. Platelets are parts of blood that stick together to form a clot. Symptoms of this condition are the result of poor blood clotting and may include bruising easily, bleeding from the nose or mouth, petechiae, and purpura. This condition may be diagnosed with blood tests and a physical exam. Treatment for this condition depends on the cause. This information is not intended to replace advice given to you by your health care provider. Make sure you discuss any questions you have with your health care provider. Document Revised: 05/01/2021 Document Reviewed: 05/01/2021 [employer] Patient Education  2023 [employer]  

## 2022-07-17 ENCOUNTER — Other Ambulatory Visit: Payer: Self-pay | Admitting: Family Medicine

## 2022-07-17 LAB — LIPID PANEL
Cholesterol: 279 mg/dL — ABNORMAL HIGH (ref 0–200)
HDL: 82 mg/dL (ref >39.00)
LDL Cholesterol: 182 mg/dL — ABNORMAL HIGH (ref 0–99)
NonHDL: 197.03
Total CHOL/HDL Ratio: 3
Triglycerides: 73 mg/dL (ref 0.0–149.0)
VLDL: 14.6 mg/dL (ref 0.0–40.0)

## 2022-07-17 LAB — VITAMIN B12: Vitamin B-12: 351 pg/mL (ref 211–911)

## 2022-07-17 LAB — CBC WITH DIFFERENTIAL/PLATELET
Basophils Absolute: 0.1 10*3/uL (ref 0.0–0.1)
Basophils Relative: 1.3 % (ref 0.0–3.0)
Eosinophils Absolute: 0.2 10*3/uL (ref 0.0–0.7)
Eosinophils Relative: 2.6 % (ref 0.0–5.0)
HCT: 45.7 % (ref 36.0–46.0)
Hemoglobin: 15.3 g/dL — ABNORMAL HIGH (ref 12.0–15.0)
Lymphocytes Relative: 28.4 % (ref 12.0–46.0)
Lymphs Abs: 2.3 10*3/uL (ref 0.7–4.0)
MCHC: 33.4 g/dL (ref 30.0–36.0)
MCV: 93.6 fl (ref 78.0–100.0)
Monocytes Absolute: 0.6 10*3/uL (ref 0.1–1.0)
Monocytes Relative: 7.8 % (ref 3.0–12.0)
Neutro Abs: 4.8 10*3/uL (ref 1.4–7.7)
Neutrophils Relative %: 59.9 % (ref 43.0–77.0)
Platelets: 178 10*3/uL (ref 150.0–400.0)
RBC: 4.88 Mil/uL (ref 3.87–5.11)
RDW: 14.5 % (ref 11.5–15.5)
WBC: 8 10*3/uL (ref 4.0–10.5)

## 2022-07-17 LAB — TSH: TSH: 0.5 u[IU]/mL (ref 0.35–5.50)

## 2022-07-17 LAB — FOLATE: Folate: 6.1 ng/mL (ref >5.9)

## 2022-07-17 MED ORDER — ROSUVASTATIN CALCIUM 20 MG PO TABS: 20.0000 mg | ORAL_TABLET | Freq: Every day | ORAL | 1 refills | Status: DC

## 2022-07-18 LAB — LAMOTRIGINE LEVEL: Lamotrigine Lvl: 9.6 ug/mL (ref 2.5–15.0)

## 2022-07-21 ENCOUNTER — Telehealth: Payer: Self-pay

## 2022-07-21 ENCOUNTER — Encounter: Payer: Self-pay | Admitting: Gynecologic Oncology

## 2022-07-21 ENCOUNTER — Other Ambulatory Visit: Payer: Self-pay | Admitting: Gynecologic Oncology

## 2022-07-21 DIAGNOSIS — F418 Other specified anxiety disorders: Secondary | ICD-10-CM

## 2022-07-21 MED ORDER — LORAZEPAM 0.5 MG PO TABS: 0.5000 mg | ORAL_TABLET | Freq: Once | ORAL | 0 refills | Status: AC

## 2022-07-21 NOTE — Progress Notes (Signed)
See CMA note. Ativan ordered to take prior to upcoming appt.

## 2022-07-21 NOTE — Telephone Encounter (Signed)
I called patient to update meaningful use information for upcoming appointment on Thursday 8/24. She states she is supposed to get a biopsy done and is wanting the "calm me down" med sent to pharmacy on file. Pt aware I will notify [name] NP.

## 2022-07-21 NOTE — Telephone Encounter (Signed)
Pt is aware of Ativan being sent to her pharmacy. Instructions also given to take 30 minutes before appointment and she will need someone to drive her to and from appointment. She voiced an understanding

## 2022-07-23 ENCOUNTER — Inpatient Hospital Stay: Payer: Managed Care, Other (non HMO) | Attending: Gynecologic Oncology | Admitting: Gynecologic Oncology

## 2022-07-23 ENCOUNTER — Other Ambulatory Visit: Payer: Self-pay

## 2022-07-23 ENCOUNTER — Encounter: Payer: Self-pay | Admitting: Gynecologic Oncology

## 2022-07-23 VITALS — BP 138/78 | HR 68 | Temp 97.9°F | Resp 16 | Ht 59.0 in | Wt 123.8 lb

## 2022-07-23 DIAGNOSIS — D071 Carcinoma in situ of vulva: Secondary | ICD-10-CM | POA: Diagnosis not present

## 2022-07-23 DIAGNOSIS — Z9889 Other specified postprocedural states: Secondary | ICD-10-CM | POA: Diagnosis not present

## 2022-07-23 DIAGNOSIS — F17210 Nicotine dependence, cigarettes, uncomplicated: Secondary | ICD-10-CM | POA: Diagnosis not present

## 2022-07-23 DIAGNOSIS — L292 Pruritus vulvae: Secondary | ICD-10-CM | POA: Diagnosis not present

## 2022-07-23 NOTE — H&P (View-Only) (Signed)
Gynecologic Oncology Return Clinic Visit  07/23/2022  Reason for Visit: Follow-up  Treatment History: Dysplasia history:  03/19/22: Vaginal cuff biopsy - VAIN1; left buttocks biopsy - VIN3, Perineal biopsy - VIN 2-3, mons biopsy - VIN 2-3. 01/2022 Vaginal pap - negative, HR HPV positive 12/2018: Pap - negative, HR HPV positive (negative for 16, 18 and 45) 05/2017: vaginal biopsies - one with benign squamous mucosa, other with VAIN I 04/2017: Vaginal pap - negative, HR HPV not detected. 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 Vaginal biopsies, one with VAIN 2, other VAIN 1. 10/2015: vaginal cuff biopsy: VAIN II.  Vulvar biopsies performed including mons and right labia, both showing condyloma. 08/2015: Vaginal pap - LSIL, HR HPV positive. 02/2014: Vaginal cuff biopsy showed condylomata acuminata, p16 negative for diffuse positive staining.  No malignancy or high-grade dysplasia.  Right labial biopsy shows condyloma. 01/2014: Vaginal pap - LSIL.  Reactive squamous cells present. HR HPV positive. 01/2009: Pathology from hysterectomy shows chronic mild inflammation of the cervix and endocervix, mesonephric hyperplasia.  No evidence of malignancy.  Secretory type endometrium. 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  Endo and ectocervical margins negative for mucosa but partially denuded.  Stromal invasion noted. 10/2008: Cervical biopsy at 6:00 shows HPV effect consistent with low-grade dysplasia.  ECC with 1 fragment of ectocervical/squamous metaplasia with low-grade intraepithelial lesion and focal changes consistent with CIN-2. 07/2020: ECC shows benign endocervix, no dysplasia. 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  Cervical biopsy at 1:00 showed squamous mucosa with koilocytic atypia, no dysplasia identified.  ECC shows rare detached fragments of slightly  dysplastic squamous mucosa with benign endocervical mucosa.   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  Has had some intermittent pruritus along the upper aspect of her left vulvar incision.  Denies any bleeding, discharge, or pain.  Past Medical/Surgical History: Past Medical History:  Diagnosis Date   ADD (attention deficit disorder)    Alcohol abuse, in remission 2012   per pt in remission since 2012   Anticoagulated on Coumadin    coumadin--- managed by cardiology/ coumadin clinic   Arthritis    In hips   Asthma    allergy induced, rare inhaler use   Auditory processing disorder    per pt very HOH, first language ASL   Bipolar disorder ([place])    Chronic left hip pain    Chronic vertigo    Complication of anesthesia    per pt due to auditory processing disorder pt is unable to hear when waking up and also gets very anxious   GAD (generalized anxiety disorder)    GERD (gastroesophageal reflux disease)    History of cervical dysplasia    History of condyloma acuminatum 2017   s/p laser ablation vulva   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   biopsy and CO2 laser ablation   HOH (hard of hearing)    per pt very HOH due to auditroy processing disorder   Hyperlipidemia    Hypertension    Muscle spasms of both lower extremities    both hips   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   23 mm Sorin [employer] [place] bileaflet mechanical valve via right anterior mini thoracotomy   Vulvar dysplasia     Past Surgical  History:  Procedure Laterality Date   ANTERIOR CRUCIATE LIGAMENT REPAIR  1993   AORTIC VALVE REPLACEMENT N/A 11/03/2017   Procedure: MINIMALLY INVASIVE AORTIC VALVE REPLACEMENT( MINI THORACOTOMY);  Surgeon: Rexene Alberts, MD;  Location: [facility];  Service: Open Heart Surgery;  Laterality: N/A;   CERVICAL BIOPSY  W/ LOOP ELECTRODE  EXCISION  2010   CIN III w/extension to glands   CLOSED REDUCTION HIP DISLOCATION Left 4580   CO2 LASER APPLICATION N/A 9/98/3382   Procedure: CO2 LASER APPLICATION TO VULVA;  Surgeon: Lafonda Mosses, MD;  Location: [facility];  Service: Gynecology;  Laterality: N/A;   COLPOSCOPY  10/30/2008   CIN I & II   COLPOSCOPY  07/31/2000   Neg. ECC   COLPOSCOPY  06/30/2001   CIN I   COLPOSCOPY  08/30/2004   ECC--atypia   COLPOSCOPY N/A 01/21/2016   Procedure: COLPOSCOPY with vaginal biopsy with CO 2 Laser of Vaginal and vulvar condyloma;  Surgeon: [name], MD;  Location: [facility];  Service: Gynecology;  Laterality: N/A;  [name] will be here 2/21 for 1115 case confirmed 01/16/15 - TS   HARDWARE REMOVAL Left 1992   Left hip   INGUINAL HERNIA REPAIR Bilateral    1981;  10982   LEFT AND RIGHT HEART CATHETERIZATION WITH CORONARY ANGIOGRAM N/A 05/18/2014   Procedure: LEFT AND RIGHT HEART CATHETERIZATION WITH CORONARY ANGIOGRAM;  Surgeon: Jettie Booze, MD;  Location[facility] C[facility];  Service: Cardiovascular;  Laterality: N/A;   LESION REMOVAL Right 05/12/2022   Procedure: Maudie Mercury OF RIGHT CHEEK CYST;  Surgeon: Cindra Presume, MD;  Location: [place] [facility];  Service: Plastics;  Laterality: Right;   LYMPH NODE BIOPSY  1995   NASAL SEPTUM SURGERY  2002   ORIF HIP FRACTURE Left 1990   per pt and  took out growth plate in Right knee   ROBOTIC ASSISTED TOTAL HYSTERECTOMY  02/26/2009   '@[facility]'$    TEE WITHOUT CARDIOVERSION N/A 11/03/2017   Procedure: TRANSESOPHAGEAL ECHOCARDIOGRAM (TEE);  Surgeon: Rexene Alberts, MD;  Location: [facility];  Service: Open Heart Surgery;  Laterality: N/A;   TONSILLECTOMY AND ADENOIDECTOMY  1990   TYMPANOSTOMY TUBE PLACEMENT Bilateral    x3  per pt last one [place] BIOPSY N/A 05/12/2022   Procedure: POSSIBLE VULVAR BIOPSY; POSSIBLE VAGINAL BIOPSY;  Surgeon: Lafonda Mosses, MD;  Location: [facility];   Service: Gynecology;  Laterality: N/A;   VULVECTOMY N/A 05/12/2022   Procedure: WIDE EXCISION VULVECTOMY;  Surgeon: Lafonda Mosses, MD;  Location: [facility];  Service: Gynecology;  Laterality: N/A;    Family History  Problem Relation Age of Onset   Hyperlipidemia Mother    Hypertension Mother    Thyroid disease Mother    Hyperlipidemia Father    Hypertension Father    Migraines Father    Hypertension Brother    Hyperlipidemia Brother    Thyroid disease Maternal Grandmother    Thyroid disease Maternal Grandfather    Prostate cancer Maternal Grandfather    Colon cancer Paternal Great-grandmother     Social History   Socioeconomic History   Marital status: Single    Spouse name: Not on file   Number of children: Not on file   Years of education: Not on file   Highest education level: Not on file  Occupational History   Not on file  Tobacco Use   Smoking status: Every Day  Years: 20.00    Types: Cigarettes   Smokeless tobacco: Never   Tobacco comments:    05-05-2022 Per pt smokes 10 cig per day  Vaping Use   Vaping Use: Never used  Substance and Sexual Activity   Alcohol use: No    Comment: in remission since 2012   Drug use: Yes    Types: Marijuana    Comment: 05-05-2022  per pt smokes on average 1/4oz per week   Sexual activity: Yes    Partners: Male    Birth control/protection: Surgical    Comment: Hysterectomy  Other Topics Concern   Not on file  Social History Narrative   Not on file   Social Determinants of Health   Financial Resource Strain: Low Risk  (12/10/2018)   Overall Financial Resource Strain (CARDIA)    Difficulty of Paying Living Expenses: Not hard at all  Food Insecurity: Unknown (12/10/2018)   Hunger Vital Sign    Worried About [place] in the Last Year: Patient refused    [place] in the Last Year: Patient refused  Transportation Needs: Unknown (12/10/2018)   [place] - Transportation    Lack of  Transportation (Medical): Patient refused    Lack of Transportation (Non-Medical): Patient refused  Physical Activity: Unknown (12/10/2018)   Exercise Vital Sign    Days of Exercise per Week: Patient refused    Minutes of Exercise per Session: Patient refused  Stress: Not on file  Social Connections: Unknown (12/10/2018)   Social Connection and Isolation Panel [NHANES]    Frequency of Communication with Friends and Family: Patient refused    Frequency of Social Gatherings with Friends and Family: Patient refused    Attends Religious Services: Patient refused    Marine scientist or Organizations: Patient refused    Attends Archivist Meetings: Patient refused    Marital Status: Patient refused    Current Medications:  Current Outpatient Medications:    albuterol (VENTOLIN HFA) 108 (90 Base) MCG/ACT inhaler, Inhale 2 puffs into the lungs every 6 (six) hours as needed for wheezing or shortness of breath., Disp: 1 each, Rfl: 3   aspirin EC 81 MG tablet, Take 81 mg by mouth daily. Swallow whole., Disp: , Rfl:    DULoxetine (CYMBALTA) 30 MG capsule, TAKE ONE CAPSULE BY MOUTH DAILY (Patient taking differently: 30 mg daily.), Disp: 30 capsule, Rfl: 3   gabapentin (NEURONTIN) 300 MG capsule, TAKE TWO CAPSULES BY MOUTH THREE TIMES A DAY, Disp: 540 capsule, Rfl: 0   lamoTRIgine (LAMICTAL) 200 MG tablet, TAKE TWO TABLETS BY MOUTH DAILY, Disp: 60 tablet, Rfl: 0   lansoprazole (PREVACID) 15 MG capsule, Take 15 mg by mouth daily as needed (for heartburn or acid reflux)., Disp: , Rfl:    lidocaine (XYLOCAINE) 5 % ointment, Apply 1 Application topically as needed for moderate pain (to the vulva)., Disp: 35.44 g, Rfl: 1   meclizine (ANTIVERT) 12.5 MG tablet, TAKE ONE TABLET BY MOUTH TWICE A DAY AS NEEDED FOR DIZZINESS (Patient taking differently: 2 (two) times daily as needed.), Disp: 20 tablet, Rfl: 0   metoprolol tartrate (LOPRESSOR) 25 MG tablet, Take 0.5 tablets (12.5 mg total) by mouth  daily., Disp: 45 tablet, Rfl: 3   senna-docusate (SENOKOT-S) 8.6-50 MG tablet, Take 2 tablets by mouth at bedtime. For AFTER surgery, do not take if having diarrhea, Disp: 30 tablet, Rfl: 0   SSD 1 % cream, APPLY 1 APPLICAITION TOPICALLY DAILY, Disp: 50 g, Rfl: 0  tiZANidine (ZANAFLEX) 4 MG tablet, TAKE ONE TABLET BY MOUTH AT BEDTIME (Patient taking differently: Take 4 mg by mouth at bedtime. And per pt takes as needed once during the day), Disp: 90 tablet, Rfl: 0   warfarin (COUMADIN) 7.5 MG tablet, TAKE 1 TO 1.5 TABLETS BY MOUTH ONCE DAILY AS DIRECTED BY COUMADIN CLLINIC, Disp: 40 tablet, Rfl: 0   LORazepam (ATIVAN) 0.5 MG tablet, Take 1 tablet (0.5 mg total) by mouth once for 1 dose. Take 30 minutes prior to appointment, do not take and drive, Disp: 1 tablet, Rfl: 0   traMADol (ULTRAM) 50 MG tablet, TAKE 2 TABLETS BY MOUTH TWICE A DAY, Disp: 120 tablet, Rfl: 0  Review of Systems: + itch, anxiety Denies appetite changes, fevers, chills, fatigue, unexplained weight changes. Denies hearing loss, neck lumps or masses, mouth sores, ringing in ears or voice changes. Denies cough or wheezing.  Denies shortness of breath. Denies chest pain or palpitations. Denies leg swelling. Denies abdominal distention, pain, blood in stools, constipation, diarrhea, nausea, vomiting, or early satiety. Denies pain with intercourse, dysuria, frequency, hematuria or incontinence. Denies hot flashes, pelvic pain, vaginal bleeding or vaginal discharge.   Denies joint pain, back pain or muscle pain/cramps. Denies rash, or wounds. Denies dizziness, headaches, numbness or seizures. Denies swollen lymph nodes or glands, denies easy bruising or bleeding. Denies depression, confusion, or decreased concentration.  Physical Exam: BP 138/78 (BP Location: Left Arm, Patient Position: Sitting)   Pulse 68   Temp 97.9 F (36.6 C) (Oral)   Resp 16   Ht '4\' 11"'$  (1.499 m)   Wt 123 lb 12.8 oz (56.2 kg)   SpO2 99%   BMI 25.00  kg/m  General: Alert, oriented, no acute distress. HEENT: Normocephalic, atraumatic, sclera anicteric. Chest: Unlabored breathing on room air Abdomen: soft, nontender.  Mild hypopigmentation throughout the right of the mons at site of prior laser.  No scabbed or crusted areas. Extremities: Grossly normal range of motion.  Warm, well perfused.  No edema bilaterally.  GU: Normal appearing external genitalia without erythema, excoriation, or lesions.  Sites of prior laser treatment with mild pigmentation.  Incision well-healed.  Some mild areas of hypopigmentation along the incision.  Biopsy vulva Preoperative diagnosis: Hypopigmentation, vulvar pruritus Postoperative diagnosis: Same as above Procedure: Vulvar biopsy Specimen: Vulvar biopsy at 1:00 Estimated blood loss: 5 cc Seizure: After discussing the procedure with the patient including risk and benefits, she signed consent.  She was placed in dorsolithotomy position.  Although very minimal skin changes noted along the incision line, given symptoms, area where patient has had pruritus was identified.  This was cleansed with Betadine x3.  2 cc of 2% lidocaine was injected for local anesthesia.  After adequate time had been given, a 3 mm punch biopsy was taken along the left aspect of the superior portion of the incision along the anterior labia.  This was placed in formalin.  Combination of pressure and silver nitrate were used to achieve hemostasis.  Laboratory & Radiologic Studies: None new  Assessment & Plan: Danielle Obrien is a 41 y.o. 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   Patient is overall healing well.  Area on the mons looks significantly better.  Patient has had some localized intermittent symptoms along the left labial incision.  Given the symptoms and positive margin, biopsy performed today.  If this is positive, we will discuss treatment options including repeat excision, laser therapy, or topical treatment.   I will call  her with biopsy results.   Follow-up was scheduled for 3 months.  If biopsy does not show evidence of dysplasia, discussed with patient signs and symptoms that should prompt a phone call prior to her next scheduled visit.  22 minutes of total time was spent for this patient encounter, including preparation, face-to-face counseling with the patient and coordination of care, and documentation of the encounter.  Jeral Pinch, MD  Division of Gynecologic Oncology  Department of Obstetrics and Gynecology  [facility]

## 2022-07-23 NOTE — Patient Instructions (Signed)
It was good to see you today.  I will call you when I get your biopsy results back.  We will tentatively plan on a follow-up visit in 3 months.  Please call me with any new symptoms.

## 2022-07-23 NOTE — Progress Notes (Signed)
Gynecologic Oncology Return Clinic Visit  07/23/2022  Reason for Visit: Follow-up  Treatment History: Dysplasia history:  03/19/22: Vaginal cuff biopsy - VAIN1; left buttocks biopsy - VIN3, Perineal biopsy - VIN 2-3, mons biopsy - VIN 2-3. 01/2022 Vaginal pap - negative, HR HPV positive 12/2018: Pap - negative, HR HPV positive (negative for 16, 18 and 45) 05/2017: vaginal biopsies - one with benign squamous mucosa, other with VAIN I 04/2017: Vaginal pap - negative, HR HPV not detected. 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 Vaginal biopsies, one with VAIN 2, other VAIN 1. 10/2015: vaginal cuff biopsy: VAIN II.  Vulvar biopsies performed including mons and right labia, both showing condyloma. 08/2015: Vaginal pap - LSIL, HR HPV positive. 02/2014: Vaginal cuff biopsy showed condylomata acuminata, p16 negative for diffuse positive staining.  No malignancy or high-grade dysplasia.  Right labial biopsy shows condyloma. 01/2014: Vaginal pap - LSIL.  Reactive squamous cells present. HR HPV positive. 01/2009: Pathology from hysterectomy shows chronic mild inflammation of the cervix and endocervix, mesonephric hyperplasia.  No evidence of malignancy.  Secretory type endometrium. 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  Endo and ectocervical margins negative for mucosa but partially denuded.  Stromal invasion noted. 10/2008: Cervical biopsy at 6:00 shows HPV effect consistent with low-grade dysplasia.  ECC with 1 fragment of ectocervical/squamous metaplasia with low-grade intraepithelial lesion and focal changes consistent with CIN-2. 07/2020: ECC shows benign endocervix, no dysplasia. 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  Cervical biopsy at 1:00 showed squamous mucosa with koilocytic atypia, no dysplasia identified.  ECC shows rare detached fragments of slightly  dysplastic squamous mucosa with benign endocervical mucosa.   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  Has had some intermittent pruritus along the upper aspect of her left vulvar incision.  Denies any bleeding, discharge, or pain.  Past Medical/Surgical History: Past Medical History:  Diagnosis Date   ADD (attention deficit disorder)    Alcohol abuse, in remission 2012   per pt in remission since 2012   Anticoagulated on Coumadin    coumadin--- managed by cardiology/ coumadin clinic   Arthritis    In hips   Asthma    allergy induced, rare inhaler use   Auditory processing disorder    per pt very HOH, first language ASL   Bipolar disorder ([place])    Chronic left hip pain    Chronic vertigo    Complication of anesthesia    per pt due to auditory processing disorder pt is unable to hear when waking up and also gets very anxious   GAD (generalized anxiety disorder)    GERD (gastroesophageal reflux disease)    History of cervical dysplasia    History of condyloma acuminatum 2017   s/p laser ablation vulva   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   biopsy and CO2 laser ablation   HOH (hard of hearing)    per pt very HOH due to auditroy processing disorder   Hyperlipidemia    Hypertension    Muscle spasms of both lower extremities    both hips   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   23 mm Sorin [employer] [place] bileaflet mechanical valve via right anterior mini thoracotomy   Vulvar dysplasia     Past Surgical  History:  Procedure Laterality Date   ANTERIOR CRUCIATE LIGAMENT REPAIR  1993   AORTIC VALVE REPLACEMENT N/A 11/03/2017   Procedure: MINIMALLY INVASIVE AORTIC VALVE REPLACEMENT( MINI THORACOTOMY);  Surgeon: Rexene Alberts, MD;  Location: [place];  Service: Open Heart Surgery;  Laterality: N/A;   CERVICAL BIOPSY  W/ LOOP ELECTRODE  EXCISION  2010   CIN III w/extension to glands   CLOSED REDUCTION HIP DISLOCATION Left 2993   CO2 LASER APPLICATION N/A 06/14/9677   Procedure: CO2 LASER APPLICATION TO VULVA;  Surgeon: Lafonda Mosses, MD;  Location: [facility];  Service: Gynecology;  Laterality: N/A;   COLPOSCOPY  10/30/2008   CIN I & II   COLPOSCOPY  07/31/2000   Neg. ECC   COLPOSCOPY  06/30/2001   CIN I   COLPOSCOPY  08/30/2004   ECC--atypia   COLPOSCOPY N/A 01/21/2016   Procedure: COLPOSCOPY with vaginal biopsy with CO 2 Laser of Vaginal and vulvar condyloma;  Surgeon: [name], MD;  Location: [facility];  Service: Gynecology;  Laterality: N/A;  [name] will be here 2/21 for 1115 case confirmed 01/16/15 - TS   HARDWARE REMOVAL Left 1992   Left hip   INGUINAL HERNIA REPAIR Bilateral    1981;  10982   LEFT AND RIGHT HEART CATHETERIZATION WITH CORONARY ANGIOGRAM N/A 05/18/2014   Procedure: LEFT AND RIGHT HEART CATHETERIZATION WITH CORONARY ANGIOGRAM;  Surgeon: Jettie Booze, MD;  Location[facility];  Service: Cardiovascular;  Laterality: N/A;   LESION REMOVAL Right 05/12/2022   Procedure: Maudie Mercury OF RIGHT CHEEK CYST;  Surgeon: Cindra Presume, MD;  Location: [place];  Service: Plastics;  Laterality: Right;   LYMPH NODE BIOPSY  1995   NASAL SEPTUM SURGERY  2002   ORIF HIP FRACTURE Left 1990   per pt and  took out growth plate in Right knee   ROBOTIC ASSISTED TOTAL HYSTERECTOMY  02/26/2009   '@[facility]'$    TEE WITHOUT CARDIOVERSION N/A 11/03/2017   Procedure: TRANSESOPHAGEAL ECHOCARDIOGRAM (TEE);  Surgeon: Rexene Alberts, MD;  Location: [place];  Service: Open Heart Surgery;  Laterality: N/A;   TONSILLECTOMY AND ADENOIDECTOMY  1990   TYMPANOSTOMY TUBE PLACEMENT Bilateral    x3  per pt last one [place] BIOPSY N/A 05/12/2022   Procedure: POSSIBLE VULVAR BIOPSY; POSSIBLE VAGINAL BIOPSY;  Surgeon: Lafonda Mosses, MD;  Location: [facility];   Service: Gynecology;  Laterality: N/A;   VULVECTOMY N/A 05/12/2022   Procedure: WIDE EXCISION VULVECTOMY;  Surgeon: Lafonda Mosses, MD;  Location: [facility];  Service: Gynecology;  Laterality: N/A;    Family History  Problem Relation Age of Onset   Hyperlipidemia Mother    Hypertension Mother    Thyroid disease Mother    Hyperlipidemia Father    Hypertension Father    Migraines Father    Hypertension Brother    Hyperlipidemia Brother    Thyroid disease Maternal Grandmother    Thyroid disease Maternal Grandfather    Prostate cancer Maternal Grandfather    Colon cancer Paternal Great-grandmother     Social History   Socioeconomic History   Marital status: Single    Spouse name: Not on file   Number of children: Not on file   Years of education: Not on file   Highest education level: Not on file  Occupational History   Not on file  Tobacco Use   Smoking status: Every Day  Years: 20.00    Types: Cigarettes   Smokeless tobacco: Never   Tobacco comments:    05-05-2022 Per pt smokes 10 cig per day  Vaping Use   Vaping Use: Never used  Substance and Sexual Activity   Alcohol use: No    Comment: in remission since 2012   Drug use: Yes    Types: Marijuana    Comment: 05-05-2022  per pt smokes on average 1/4oz per week   Sexual activity: Yes    Partners: Male    Birth control/protection: Surgical    Comment: Hysterectomy  Other Topics Concern   Not on file  Social History Narrative   Not on file   Social Determinants of Health   Financial Resource Strain: Low Risk  (12/10/2018)   Overall Financial Resource Strain (CARDIA)    Difficulty of Paying Living Expenses: Not hard at all  Food Insecurity: Unknown (12/10/2018)   Hunger Vital Sign    Worried About [place] in the Last Year: Patient refused    [place] in the Last Year: Patient refused  Transportation Needs: Unknown (12/10/2018)   [place] - Transportation    Lack of  Transportation (Medical): Patient refused    Lack of Transportation (Non-Medical): Patient refused  Physical Activity: Unknown (12/10/2018)   Exercise Vital Sign    Days of Exercise per Week: Patient refused    Minutes of Exercise per Session: Patient refused  Stress: Not on file  Social Connections: Unknown (12/10/2018)   Social Connection and Isolation Panel [NHANES]    Frequency of Communication with Friends and Family: Patient refused    Frequency of Social Gatherings with Friends and Family: Patient refused    Attends Religious Services: Patient refused    Marine scientist or Organizations: Patient refused    Attends Archivist Meetings: Patient refused    Marital Status: Patient refused    Current Medications:  Current Outpatient Medications:    albuterol (VENTOLIN HFA) 108 (90 Base) MCG/ACT inhaler, Inhale 2 puffs into the lungs every 6 (six) hours as needed for wheezing or shortness of breath., Disp: 1 each, Rfl: 3   aspirin EC 81 MG tablet, Take 81 mg by mouth daily. Swallow whole., Disp: , Rfl:    DULoxetine (CYMBALTA) 30 MG capsule, TAKE ONE CAPSULE BY MOUTH DAILY (Patient taking differently: 30 mg daily.), Disp: 30 capsule, Rfl: 3   gabapentin (NEURONTIN) 300 MG capsule, TAKE TWO CAPSULES BY MOUTH THREE TIMES A DAY, Disp: 540 capsule, Rfl: 0   lamoTRIgine (LAMICTAL) 200 MG tablet, TAKE TWO TABLETS BY MOUTH DAILY, Disp: 60 tablet, Rfl: 0   lansoprazole (PREVACID) 15 MG capsule, Take 15 mg by mouth daily as needed (for heartburn or acid reflux)., Disp: , Rfl:    lidocaine (XYLOCAINE) 5 % ointment, Apply 1 Application topically as needed for moderate pain (to the vulva)., Disp: 35.44 g, Rfl: 1   meclizine (ANTIVERT) 12.5 MG tablet, TAKE ONE TABLET BY MOUTH TWICE A DAY AS NEEDED FOR DIZZINESS (Patient taking differently: 2 (two) times daily as needed.), Disp: 20 tablet, Rfl: 0   metoprolol tartrate (LOPRESSOR) 25 MG tablet, Take 0.5 tablets (12.5 mg total) by mouth  daily., Disp: 45 tablet, Rfl: 3   senna-docusate (SENOKOT-S) 8.6-50 MG tablet, Take 2 tablets by mouth at bedtime. For AFTER surgery, do not take if having diarrhea, Disp: 30 tablet, Rfl: 0   SSD 1 % cream, APPLY 1 APPLICAITION TOPICALLY DAILY, Disp: 50 g, Rfl: 0  tiZANidine (ZANAFLEX) 4 MG tablet, TAKE ONE TABLET BY MOUTH AT BEDTIME (Patient taking differently: Take 4 mg by mouth at bedtime. And per pt takes as needed once during the day), Disp: 90 tablet, Rfl: 0   warfarin (COUMADIN) 7.5 MG tablet, TAKE 1 TO 1.5 TABLETS BY MOUTH ONCE DAILY AS DIRECTED BY COUMADIN CLLINIC, Disp: 40 tablet, Rfl: 0   LORazepam (ATIVAN) 0.5 MG tablet, Take 1 tablet (0.5 mg total) by mouth once for 1 dose. Take 30 minutes prior to appointment, do not take and drive, Disp: 1 tablet, Rfl: 0   traMADol (ULTRAM) 50 MG tablet, TAKE 2 TABLETS BY MOUTH TWICE A DAY, Disp: 120 tablet, Rfl: 0  Review of Systems: + itch, anxiety Denies appetite changes, fevers, chills, fatigue, unexplained weight changes. Denies hearing loss, neck lumps or masses, mouth sores, ringing in ears or voice changes. Denies cough or wheezing.  Denies shortness of breath. Denies chest pain or palpitations. Denies leg swelling. Denies abdominal distention, pain, blood in stools, constipation, diarrhea, nausea, vomiting, or early satiety. Denies pain with intercourse, dysuria, frequency, hematuria or incontinence. Denies hot flashes, pelvic pain, vaginal bleeding or vaginal discharge.   Denies joint pain, back pain or muscle pain/cramps. Denies rash, or wounds. Denies dizziness, headaches, numbness or seizures. Denies swollen lymph nodes or glands, denies easy bruising or bleeding. Denies depression, confusion, or decreased concentration.  Physical Exam: BP 138/78 (BP Location: Left Arm, Patient Position: Sitting)   Pulse 68   Temp 97.9 F (36.6 C) (Oral)   Resp 16   Ht '4\' 11"'$  (1.499 m)   Wt 123 lb 12.8 oz (56.2 kg)   SpO2 99%   BMI 25.00  kg/m  General: Alert, oriented, no acute distress. HEENT: Normocephalic, atraumatic, sclera anicteric. Chest: Unlabored breathing on room air Abdomen: soft, nontender.  Mild hypopigmentation throughout the right of the mons at site of prior laser.  No scabbed or crusted areas. Extremities: Grossly normal range of motion.  Warm, well perfused.  No edema bilaterally.  GU: Normal appearing external genitalia without erythema, excoriation, or lesions.  Sites of prior laser treatment with mild pigmentation.  Incision well-healed.  Some mild areas of hypopigmentation along the incision.  Biopsy vulva Preoperative diagnosis: Hypopigmentation, vulvar pruritus Postoperative diagnosis: Same as above Procedure: Vulvar biopsy Specimen: Vulvar biopsy at 1:00 Estimated blood loss: 5 cc Seizure: After discussing the procedure with the patient including risk and benefits, she signed consent.  She was placed in dorsolithotomy position.  Although very minimal skin changes noted along the incision line, given symptoms, area where patient has had pruritus was identified.  This was cleansed with Betadine x3.  2 cc of 2% lidocaine was injected for local anesthesia.  After adequate time had been given, a 3 mm punch biopsy was taken along the left aspect of the superior portion of the incision along the anterior labia.  This was placed in formalin.  Combination of pressure and silver nitrate were used to achieve hemostasis.  Laboratory & Radiologic Studies: None new  Assessment & Plan: Danielle Obrien is a 41 y.o. 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   Patient is overall healing well.  Area on the mons looks significantly better.  Patient has had some localized intermittent symptoms along the left labial incision.  Given the symptoms and positive margin, biopsy performed today.  If this is positive, we will discuss treatment options including repeat excision, laser therapy, or topical treatment.   I will call  her with biopsy results.   Follow-up was scheduled for 3 months.  If biopsy does not show evidence of dysplasia, discussed with patient signs and symptoms that should prompt a phone call prior to her next scheduled visit.  22 minutes of total time was spent for this patient encounter, including preparation, face-to-face counseling with the patient and coordination of care, and documentation of the encounter.  Jeral Pinch, MD  Division of Gynecologic Oncology  Department of Obstetrics and Gynecology  [facility]

## 2022-07-27 LAB — SURGICAL PATHOLOGY

## 2022-07-28 ENCOUNTER — Telehealth: Payer: Self-pay | Admitting: Gynecologic Oncology

## 2022-07-28 NOTE — Telephone Encounter (Signed)
Called patient to discuss biopsy results from last week.  Show high-grade dysplasia.  Recommended treatment either with laser or excision as an outpatient surgery.  Patient amenable.  I will ask Melissa to call her to get this scheduled in the upcoming 4-6 weeks.  Jeral Pinch MD Gynecologic Oncology

## 2022-07-29 ENCOUNTER — Telehealth: Payer: Self-pay

## 2022-07-29 ENCOUNTER — Other Ambulatory Visit: Payer: Self-pay | Admitting: Family Medicine

## 2022-07-29 NOTE — Telephone Encounter (Signed)
I spoke to patient today with a potential date for an outpatient procedure with Dr.Tucker.  Per [name] NP, September 13 would be next available date. Pt agrees to date.  [name] NP notified

## 2022-07-30 ENCOUNTER — Other Ambulatory Visit: Payer: Self-pay | Admitting: Gynecologic Oncology

## 2022-07-30 ENCOUNTER — Encounter (HOSPITAL_BASED_OUTPATIENT_CLINIC_OR_DEPARTMENT_OTHER): Payer: Self-pay | Admitting: Gynecologic Oncology

## 2022-07-30 ENCOUNTER — Ambulatory Visit: Payer: Managed Care, Other (non HMO)

## 2022-07-30 ENCOUNTER — Encounter: Payer: Self-pay | Admitting: Gynecologic Oncology

## 2022-07-30 ENCOUNTER — Other Ambulatory Visit: Payer: Self-pay

## 2022-07-30 DIAGNOSIS — D071 Carcinoma in situ of vulva: Secondary | ICD-10-CM

## 2022-07-30 NOTE — Telephone Encounter (Signed)
Spoke with Ms Zeiders and told her that [name], NP sent her her pre-op instructions via a [employer]. Pt stated that she is having difficulty accessing MyChart. Deactivated her MyChart and sent a text link so she can set up her account again. Told Ms Wilinski to call the office tomorrow if she still canot get into [facility] Chart so her instructions can be printed and given to her.  Told Ms Michna  that she needs to stop her coumadin 5 days prir to her surgery on 08-12-22 per [name], NP. Her last dose should be on 08-06-22. She will be told when to resume the coumadin after her surgery.  She will need to make an appointment for a INR check soon after surgery. Pt verbalized understanding.

## 2022-07-30 NOTE — Progress Notes (Signed)
Spoke w/ via phone for pre-op interview---pt Lab needs dos----I stat, pt               Lab results------ekg 01-15-2022 chart/epic COVID test -----patient states asymptomatic no test needed Arrive at -------900 am 08-12-2022 NPO after MN NO Solid Food.  Clear liquids from MN until---800 am Med rec completed Medications to take morning of surgery -----gabapentin, lamictal, cymbalta, tramadol, rosuvastatin, metoprolol tartrate, prevacid Diabetic medication -----n/a Patient instructed no nail polish to be worn day of surgery Patient instructed to bring photo id and insurance card day of surgery Patient aware to have Driver (ride )  driver parents mother teresa to stay/ caregiver   best friend jamie hendricks for 24 hours after surgery  Patient Special Instructions -----bring rescue inhaler dos Pre-Op special Istructions -----pt vocalized understanding stop coumdin 5 days before surgery per [facility] cross np last dose to be 08-06-2022.  Patient states she is very hoh due to auditory processing disorder and her first language is ASL, requested in person interpreter for dos via [place] email. Patient mother is also deaf.  Pt requests to be positioned in or prior to induction due to chronic left hip pain  Patient verbalized understanding of instructions that were given at this phone interview. Patient denies shortness of breath, chest pain, fever, cough at this phone interview.   Anesthesia Review:Pt stated due to auditory processing disorder she is unable to hear when waking from anesthesia.HTN, s/p AVR 10-2017, on coumadin , asthma   PCP:dr t jones Cardiologist :dr t turner (lov 01-15-2022 epic) Chest x-ray :06-26-2021 epic EKG :01-15-2022 epic Echo :02-19-2022 epic Stress test:none Cardiac Cath : 04-30-2014 epic Activity level: pt denies sob with activity Sleep Study/ CPAP :none ASA / Instructions/ Last Dose :  81 mg aspirin

## 2022-08-06 ENCOUNTER — Ambulatory Visit: Payer: Managed Care, Other (non HMO) | Attending: Cardiology

## 2022-08-06 DIAGNOSIS — Z7901 Long term (current) use of anticoagulants: Secondary | ICD-10-CM | POA: Diagnosis not present

## 2022-08-06 DIAGNOSIS — Z954 Presence of other heart-valve replacement: Secondary | ICD-10-CM | POA: Diagnosis not present

## 2022-08-06 DIAGNOSIS — Q231 Congenital insufficiency of aortic valve: Secondary | ICD-10-CM

## 2022-08-06 DIAGNOSIS — Q230 Congenital stenosis of aortic valve: Secondary | ICD-10-CM | POA: Diagnosis not present

## 2022-08-06 DIAGNOSIS — Z5181 Encounter for therapeutic drug level monitoring: Secondary | ICD-10-CM

## 2022-08-06 LAB — POCT INR: INR: 3.5 — AB (ref 2.0–3.0)

## 2022-08-06 NOTE — Patient Instructions (Signed)
*  PLEASE PUT ON APPT NO ONSITE INTERPRETER NEEDED*   continue 1 tablet daily except for 1.5 tablets on Sundays and Thursdays. Recheck INR in 2 weeks. Coumadin Clinic [phone]; Surgery 9/13 HOLDING 5 DAYS PRIOR;

## 2022-08-11 ENCOUNTER — Telehealth: Payer: Self-pay

## 2022-08-11 NOTE — Telephone Encounter (Signed)
Telephone call to check on pre-operative status.  Patient compliant with pre-operative instructions.  Reinforced nothing to eat after midnight. Clear liquids until 8:00am. Patient to arrive at 9:00am.  No questions or concerns voiced.  Instructed to call for any needs.   Pt states she stopped Coumadin on 08/06/22 as instructed

## 2022-08-12 ENCOUNTER — Encounter (HOSPITAL_BASED_OUTPATIENT_CLINIC_OR_DEPARTMENT_OTHER)
Admission: RE | Disposition: A | Discharge status: Home / Self Care | Payer: Self-pay | Source: Home / Self Care | Attending: Gynecologic Oncology

## 2022-08-12 ENCOUNTER — Ambulatory Visit (HOSPITAL_BASED_OUTPATIENT_CLINIC_OR_DEPARTMENT_OTHER): Payer: Managed Care, Other (non HMO) | Admitting: Anesthesiology

## 2022-08-12 ENCOUNTER — Other Ambulatory Visit: Payer: Self-pay

## 2022-08-12 ENCOUNTER — Encounter (HOSPITAL_BASED_OUTPATIENT_CLINIC_OR_DEPARTMENT_OTHER): Payer: Self-pay | Admitting: Gynecologic Oncology

## 2022-08-12 ENCOUNTER — Telehealth: Payer: Self-pay | Admitting: *Deleted

## 2022-08-12 ENCOUNTER — Ambulatory Visit (HOSPITAL_BASED_OUTPATIENT_CLINIC_OR_DEPARTMENT_OTHER)
Admission: RE | Admit: 2022-08-12 | Discharge: 2022-08-12 | Disposition: A | Discharge status: Home / Self Care | Payer: Managed Care, Other (non HMO) | Attending: Gynecologic Oncology | Admitting: Gynecologic Oncology

## 2022-08-12 DIAGNOSIS — D071 Carcinoma in situ of vulva: Secondary | ICD-10-CM

## 2022-08-12 DIAGNOSIS — J45909 Unspecified asthma, uncomplicated: Secondary | ICD-10-CM

## 2022-08-12 DIAGNOSIS — F419 Anxiety disorder, unspecified: Secondary | ICD-10-CM | POA: Insufficient documentation

## 2022-08-12 DIAGNOSIS — F17210 Nicotine dependence, cigarettes, uncomplicated: Secondary | ICD-10-CM | POA: Insufficient documentation

## 2022-08-12 DIAGNOSIS — L905 Scar conditions and fibrosis of skin: Secondary | ICD-10-CM | POA: Diagnosis not present

## 2022-08-12 DIAGNOSIS — I10 Essential (primary) hypertension: Secondary | ICD-10-CM | POA: Insufficient documentation

## 2022-08-12 DIAGNOSIS — K219 Gastro-esophageal reflux disease without esophagitis: Secondary | ICD-10-CM | POA: Diagnosis not present

## 2022-08-12 DIAGNOSIS — Z01818 Encounter for other preprocedural examination: Secondary | ICD-10-CM

## 2022-08-12 DIAGNOSIS — N904 Leukoplakia of vulva: Secondary | ICD-10-CM | POA: Insufficient documentation

## 2022-08-12 DIAGNOSIS — Z952 Presence of prosthetic heart valve: Secondary | ICD-10-CM | POA: Insufficient documentation

## 2022-08-12 HISTORY — PX: VULVECTOMY: SHX1086

## 2022-08-12 LAB — POCT I-STAT, CHEM 8
BUN: 5 mg/dL — ABNORMAL LOW (ref 6–20)
Calcium, Ion: 1.1 mmol/L — ABNORMAL LOW (ref 1.15–1.40)
Chloride: 109 mmol/L (ref 98–111)
Creatinine, Ser: 0.6 mg/dL (ref 0.44–1.00)
Glucose, Bld: 109 mg/dL — ABNORMAL HIGH (ref 70–99)
HCT: 45 % (ref 36.0–46.0)
Hemoglobin: 15.3 g/dL — ABNORMAL HIGH (ref 12.0–15.0)
Potassium: 3.6 mmol/L (ref 3.5–5.1)
Sodium: 141 mmol/L (ref 135–145)
TCO2: 21 mmol/L — ABNORMAL LOW (ref 22–32)

## 2022-08-12 LAB — PROTIME-INR
INR: 1 (ref 0.8–1.2)
Prothrombin Time: 12.8 seconds (ref 11.4–15.2)

## 2022-08-12 SURGERY — WIDE EXCISION VULVECTOMY
Anesthesia: General | Site: Vagina

## 2022-08-12 MED ORDER — KETAMINE HCL 10 MG/ML IJ SOLN
INTRAMUSCULAR | Status: DC | PRN
  Administered 2022-08-12: 25 mg via INTRAVENOUS

## 2022-08-12 MED ORDER — FENTANYL CITRATE (PF) 100 MCG/2ML IJ SOLN
INTRAMUSCULAR | Status: DC | PRN
  Administered 2022-08-12 (×2): 50 ug via INTRAVENOUS

## 2022-08-12 MED ORDER — LIDOCAINE HCL 1 % IJ SOLN
INTRAMUSCULAR | Status: DC | PRN
  Administered 2022-08-12: 10 mL

## 2022-08-12 MED ORDER — SENNOSIDES-DOCUSATE SODIUM 8.6-50 MG PO TABS: 2.0000 | ORAL_TABLET | Freq: Every day | ORAL | 1 refills | Status: DC

## 2022-08-12 MED ORDER — SCOPOLAMINE 1 MG/3DAYS TD PT72
MEDICATED_PATCH | TRANSDERMAL | Status: AC
  Filled 2022-08-12: qty 1

## 2022-08-12 MED ORDER — MIDAZOLAM HCL 2 MG/2ML IJ SOLN
2.0000 mg | Freq: Once | INTRAMUSCULAR | Status: AC
  Administered 2022-08-12: 2 mg via INTRAVENOUS

## 2022-08-12 MED ORDER — PHENYLEPHRINE 80 MCG/ML (10ML) SYRINGE FOR IV PUSH (FOR BLOOD PRESSURE SUPPORT)
PREFILLED_SYRINGE | INTRAVENOUS | Status: AC
  Filled 2022-08-12: qty 10

## 2022-08-12 MED ORDER — ACETAMINOPHEN 500 MG PO TABS
ORAL_TABLET | ORAL | Status: AC
  Filled 2022-08-12: qty 2

## 2022-08-12 MED ORDER — FERRIC SUBSULFATE 259 MG/GM EX SOLN
CUTANEOUS | Status: AC
  Filled 2022-08-12: qty 8

## 2022-08-12 MED ORDER — LIDOCAINE HCL 1 % IJ SOLN
INTRAMUSCULAR | Status: AC
  Filled 2022-08-12: qty 20

## 2022-08-12 MED ORDER — ACETAMINOPHEN 500 MG PO TABS
1000.0000 mg | ORAL_TABLET | ORAL | Status: AC
  Administered 2022-08-12: 1000 mg via ORAL

## 2022-08-12 MED ORDER — MIDAZOLAM HCL 2 MG/2ML IJ SOLN
INTRAMUSCULAR | Status: AC
  Filled 2022-08-12: qty 2

## 2022-08-12 MED ORDER — FENTANYL CITRATE (PF) 100 MCG/2ML IJ SOLN
25.0000 ug | INTRAMUSCULAR | Status: DC | PRN
  Administered 2022-08-12 (×3): 25 ug via INTRAVENOUS

## 2022-08-12 MED ORDER — OXYCODONE HCL 5 MG PO TABS: 5.0000 mg | ORAL_TABLET | ORAL | 0 refills | Status: DC | PRN

## 2022-08-12 MED ORDER — EPHEDRINE 5 MG/ML INJ
INTRAVENOUS | Status: AC
  Filled 2022-08-12: qty 5

## 2022-08-12 MED ORDER — OXYCODONE HCL 5 MG/5ML PO SOLN: 5.0000 mg | Freq: Once | ORAL | Status: DC | PRN

## 2022-08-12 MED ORDER — OXYCODONE HCL 5 MG PO TABS: 5.0000 mg | ORAL_TABLET | Freq: Once | ORAL | Status: DC | PRN

## 2022-08-12 MED ORDER — FENTANYL CITRATE (PF) 100 MCG/2ML IJ SOLN
INTRAMUSCULAR | Status: AC
  Filled 2022-08-12: qty 2

## 2022-08-12 MED ORDER — ACETIC ACID 5 % SOLN
Status: DC | PRN
  Administered 2022-08-12: 1 via TOPICAL

## 2022-08-12 MED ORDER — MIDAZOLAM HCL 2 MG/2ML IJ SOLN
INTRAMUSCULAR | Status: DC | PRN
  Administered 2022-08-12: 1 mg via INTRAVENOUS

## 2022-08-12 MED ORDER — DEXAMETHASONE SODIUM PHOSPHATE 10 MG/ML IJ SOLN
4.0000 mg | INTRAMUSCULAR | Status: AC
  Administered 2022-08-12: 4 mg via INTRAVENOUS

## 2022-08-12 MED ORDER — 0.9 % SODIUM CHLORIDE (POUR BTL) OPTIME
TOPICAL | Status: DC | PRN
  Administered 2022-08-12: 1000 mL

## 2022-08-12 MED ORDER — KETAMINE HCL 50 MG/5ML IJ SOSY
PREFILLED_SYRINGE | INTRAMUSCULAR | Status: AC
  Filled 2022-08-12: qty 5

## 2022-08-12 MED ORDER — ONDANSETRON HCL 4 MG/2ML IJ SOLN: 4.0000 mg | Freq: Four times a day (QID) | INTRAMUSCULAR | Status: DC | PRN

## 2022-08-12 MED ORDER — SILVER SULFADIAZINE 1 % EX CREA
TOPICAL_CREAM | CUTANEOUS | Status: AC
  Filled 2022-08-12: qty 50

## 2022-08-12 MED ORDER — LIDOCAINE 2% (20 MG/ML) 5 ML SYRINGE
INTRAMUSCULAR | Status: DC | PRN
  Administered 2022-08-12: 40 mg via INTRAVENOUS

## 2022-08-12 MED ORDER — SCOPOLAMINE 1 MG/3DAYS TD PT72
1.0000 | MEDICATED_PATCH | TRANSDERMAL | Status: DC
  Administered 2022-08-12: 1.5 mg via TRANSDERMAL

## 2022-08-12 MED ORDER — LACTATED RINGERS IV SOLN: INTRAVENOUS | Status: DC

## 2022-08-12 MED ORDER — EPHEDRINE SULFATE-NACL 50-0.9 MG/10ML-% IV SOSY
PREFILLED_SYRINGE | INTRAVENOUS | Status: DC | PRN
  Administered 2022-08-12: 5 mg via INTRAVENOUS

## 2022-08-12 MED ORDER — LIDOCAINE HCL (PF) 2 % IJ SOLN
INTRAMUSCULAR | Status: AC
  Filled 2022-08-12: qty 5

## 2022-08-12 MED ORDER — PHENYLEPHRINE 80 MCG/ML (10ML) SYRINGE FOR IV PUSH (FOR BLOOD PRESSURE SUPPORT)
PREFILLED_SYRINGE | INTRAVENOUS | Status: DC | PRN
  Administered 2022-08-12: 80 ug via INTRAVENOUS

## 2022-08-12 MED ORDER — IODINE STRONG (LUGOLS) 5 % PO SOLN
ORAL | Status: AC
  Filled 2022-08-12: qty 1

## 2022-08-12 MED ORDER — PROPOFOL 10 MG/ML IV BOLUS
INTRAVENOUS | Status: DC | PRN
  Administered 2022-08-12: 20 mg via INTRAVENOUS
  Administered 2022-08-12: 200 mg via INTRAVENOUS

## 2022-08-12 MED ORDER — ONDANSETRON HCL 4 MG/2ML IJ SOLN
INTRAMUSCULAR | Status: DC | PRN
  Administered 2022-08-12: 4 mg via INTRAVENOUS

## 2022-08-12 MED ORDER — PROPOFOL 10 MG/ML IV BOLUS
INTRAVENOUS | Status: AC
  Filled 2022-08-12: qty 20

## 2022-08-12 MED ORDER — ACETIC ACID 5 % SOLN
Status: AC
  Filled 2022-08-12: qty 50

## 2022-08-12 SURGICAL SUPPLY — 37 items
BLADE CLIPPER SENSICLIP SURGIC (BLADE) IMPLANT
BLADE SURG 15 STRL LF DISP TIS (BLADE) ×2 IMPLANT
BLADE SURG 15 STRL SS (BLADE) ×2
CATH ROBINSON RED A/P 16FR (CATHETERS) ×2 IMPLANT
DEPRESSOR TONGUE 6 IN STERILE (GAUZE/BANDAGES/DRESSINGS) ×2 IMPLANT
DRSG TELFA 3X8 NADH STRL (GAUZE/BANDAGES/DRESSINGS) IMPLANT
ELECT BALL LEEP 3MM BLK (ELECTRODE) IMPLANT
GAUZE 4X4 16PLY ~~LOC~~+RFID DBL (SPONGE) ×2 IMPLANT
GLOVE BIO SURGEON STRL SZ 6 (GLOVE) ×4 IMPLANT
GOWN STRL REUS W/TWL LRG LVL3 (GOWN DISPOSABLE) ×2 IMPLANT
KIT TURNOVER CYSTO (KITS) ×2 IMPLANT
NDL HYPO 25X1 1.5 SAFETY (NEEDLE) ×1 IMPLANT
NEEDLE HYPO 25X1 1.5 SAFETY (NEEDLE) ×2 IMPLANT
NS IRRIG 500ML POUR BTL (IV SOLUTION) ×2 IMPLANT
PACK PERINEAL COLD (PAD) ×2 IMPLANT
PACK VAGINAL WOMENS (CUSTOM PROCEDURE TRAY) ×2 IMPLANT
PAD PREP 24X48 CUFFED NSTRL (MISCELLANEOUS) ×2 IMPLANT
PUNCH BIOPSY DERMAL 3 (INSTRUMENTS) IMPLANT
PUNCH BIOPSY DERMAL 3MM (INSTRUMENTS)
PUNCH BIOPSY DERMAL 4MM (INSTRUMENTS) IMPLANT
SUT VIC AB 0 CT1 36 (SUTURE) IMPLANT
SUT VIC AB 0 SH 27 (SUTURE) ×2 IMPLANT
SUT VIC AB 2-0 CT2 27 (SUTURE) IMPLANT
SUT VIC AB 2-0 SH 27 (SUTURE)
SUT VIC AB 2-0 SH 27XBRD (SUTURE) IMPLANT
SUT VIC AB 3-0 PS2 18 (SUTURE)
SUT VIC AB 3-0 PS2 18XBRD (SUTURE) IMPLANT
SUT VIC AB 3-0 SH 27 (SUTURE) ×2
SUT VIC AB 3-0 SH 27X BRD (SUTURE) ×2 IMPLANT
SUT VIC AB 4-0 PS2 18 (SUTURE) ×2 IMPLANT
SUT VICRYL 0 UR6 27IN ABS (SUTURE) IMPLANT
SWAB OB GYN 8IN STERILE 2PK (MISCELLANEOUS) ×4 IMPLANT
SYR BULB IRRIG 60ML STRL (SYRINGE) ×2 IMPLANT
TOWEL OR 17X26 10 PK STRL BLUE (TOWEL DISPOSABLE) ×2 IMPLANT
TUBE CONNECTING 12X1/4 (SUCTIONS) IMPLANT
VACUUM HOSE 7/8X10 W/ WAND (MISCELLANEOUS) IMPLANT
WATER STERILE IRR 500ML POUR (IV SOLUTION) ×2 IMPLANT

## 2022-08-12 NOTE — Discharge Instructions (Addendum)
AFTER SURGERY INSTRUCTIONS   Return to work:  4-6 weeks if applicable, variable  Ok to resume Warfarin on 9/14.  We recommend purchasing several bags of frozen green peas and dividing them into ziploc bags. You will want to keep these in the freezer and have them ready to use as ice packs to the vulvar incision. Once the ice pack is no longer cold, you can get another from the freezer. The frozen peas mold to your body better than a regular ice pack.    Activity: 1. Be up and out of the bed during the day.  Take a nap if needed.  You may walk up steps but be careful and use the hand rail.  Stair climbing will tire you more than you think, you may need to stop part way and rest.    2. No lifting or straining for 4 weeks over 10 pounds. No pushing, pulling, straining for 4 weeks.   3. No driving for minimum 24 hours after surgery with this being usually longer since you need to be off pain meds and be able to brake safely.  Do not drive if you are taking narcotic pain medicine and make sure that your reaction time has returned.    4. You can shower as soon as the next day after surgery. Shower daily. No tub baths or submerging your body in water until cleared by your surgeon. If you have the soap that was given to you by pre-surgical testing that was used before surgery, you do not need to use it afterwards because this can irritate your incisions.    5. No sexual activity and nothing in the vagina for 4 weeks minimum.   6. You may experience vulvar/ vaginal spotting and discharge after surgery.  The spotting is normal but if you experience heavy bleeding, call our office.   7. Take Tylenol or ibuprofen first for pain if you are able to take these medications and only use narcotic pain medication for severe pain not relieved by the Tylenol or Ibuprofen.  Monitor your Tylenol intake to a max of 4,000 mg in a 24 hour period. You can alternate these medications after surgery.   Diet: 1. Low sodium  Heart Healthy Diet is recommended but you are cleared to resume your normal (before surgery) diet after your procedure.   2. It is safe to use a laxative, such as Miralax or Colace, if you have difficulty moving your bowels. You will be prescribed Sennakot at bedtime every evening to keep bowel movements regular and to prevent constipation.     Wound Care: 1. Keep clean and dry.  Shower daily.   Reasons to call the Doctor: Fever - Oral temperature greater than 100.4 degrees Fahrenheit Foul-smelling vaginal discharge Difficulty urinating Nausea and vomiting Increased pain at the site of the incision that is unrelieved with pain medicine. Difficulty breathing with or without chest pain New calf pain especially if only on one side Sudden, continuing increased vaginal bleeding with or without clots.   Contacts: For questions or concerns you should contact:   Dr. Jeral Pinch at [phone]   [name], NP at [phone]   After Hours: call [phone] and have the GYN Oncologist paged/contacted (after 5 pm or on the weekends).   Messages sent via mychart are for non-urgent matters and are not responded to after hours so for urgent needs, please call the after hours number.       Post Anesthesia Home Care Instructions  Activity: Get plenty of rest for the remainder of the day. A responsible individual must stay with you for 24 hours following the procedure.  For the next 24 hours, DO NOT: -Drive a car -Paediatric nurse -Drink alcoholic beverages -Take any medication unless instructed by your physician -Make any legal decisions or sign important papers.  Meals: Start with liquid foods such as gelatin or soup. Progress to regular foods as tolerated. Avoid greasy, spicy, heavy foods. If nausea and/or vomiting occur, drink only clear liquids until the nausea and/or vomiting subsides. Call your physician if vomiting continues.  Special Instructions/Symptoms: Your  throat may feel dry or sore from the anesthesia or the breathing tube placed in your throat during surgery. If this causes discomfort, gargle with warm salt water. The discomfort should disappear within 24 hours.  If you had a scopolamine patch placed behind your ear for the management of post- operative nausea and/or vomiting:  1. The medication in the patch is effective for 72 hours, after which it should be removed.  Wrap patch in a tissue and discard in the trash. Wash hands thoroughly with soap and water. 2. You may remove the patch earlier than 72 hours if you experience unpleasant side effects which may include dry mouth, dizziness or visual disturbances. 3. Avoid touching the patch. Wash your hands with soap and water after contact with the patch.   Do not take any Tylenol until after 4:30 pm today if needed.

## 2022-08-12 NOTE — Transfer of Care (Signed)
Immediate Anesthesia Transfer of Care Note  Patient: Danielle Obrien  Procedure(s) Performed: Procedure(s) (LRB): WIDE LOCAL EXCISION VULVECTOMY (N/A)  Patient Location: PACU  Anesthesia Type: General  Level of Consciousness: awake, alert  and oriented  Airway & Oxygen Therapy: Patient Spontanous Breathing and Patient connected to nasal cannula oxygen  Post-op Assessment: Report given to PACU RN and Post -op Vital signs reviewed and stable  Post vital signs: Reviewed and stable  Complications: No apparent anesthesia complications  Last Vitals:  Vitals Value Taken Time  BP 130/79 08/12/22 1301  Temp 36.8 C 08/12/22 1301  Pulse 74 08/12/22 1311  Resp 24 08/12/22 1311  SpO2 97 % 08/12/22 1311  Vitals shown include unvalidated device data.  Last Pain:  Vitals:   08/12/22 1022  TempSrc: Oral  PainSc: 3          Complications: No notable events documented.

## 2022-08-12 NOTE — Anesthesia Procedure Notes (Addendum)
Procedure Name: LMA Insertion Date/Time: 08/12/2022 12:16 PM  Performed by: Mechele Claude, CRNAPre-anesthesia Checklist: Patient identified, Emergency Drugs available, Suction available and Patient being monitored Patient Re-evaluated:Patient Re-evaluated prior to induction Oxygen Delivery Method: Circle system utilized Preoxygenation: Pre-oxygenation with 100% oxygen Induction Type: IV induction Ventilation: Mask ventilation without difficulty LMA: LMA inserted LMA Size: 4.0 Number of attempts: 1 Airway Equipment and Method: Bite block Placement Confirmation: positive ETCO2 Tube secured with: Tape Dental Injury: Teeth and Oropharynx as per pre-operative assessment

## 2022-08-12 NOTE — Op Note (Signed)
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 2 mm of minimal leukoplakia along posterior fourchette (biopsied). Previously treated right mons biopsied.  Procedure: The patient was identified in the preoperative holding area. Informed consent was signed on the chart. Patient was seen history was reviewed and exam was performed.   The patient was then taken to the operating room and placed in the supine position with SCD hose on. General anesthesia was then induced without difficulty. She was then placed in the dorsolithotomy position. The perineum was prepped with Betadine. The vagina was prepped with Betadine. The patient was then draped after the prep was dried.   Timeout was performed the patient, procedure, antibiotic, allergy, and length of procedure. 5% acetic acid solution was applied to the perineum. The vulvar tissues were inspected for areas of acetowhite changes or leukoplakia. The lesion was identified and the marking pen was used to circumscribe the area with appropriate surgical margins. The subcuticular tissues were infiltrated with 1% lidocaine. Several biopsies taken as noted above. The 15 blade scalpel was used to make an incision through the skin circumferentially as marked. The skin elipse was grasped and was separated from the underlying deep  dermal tissues with the bovie device. After the specimen had been completely resected, it was oriented and marked at 12 o'clock with a 0-vicryl suture. The bovie was used to obtain hemostasis at the surgical bed. The subcutaneous tissues were irrigated and made hemostatic.   The deep dermal layer was approximated with 3-0vicryl mattress sutures to bring the skin edges into approximation and off tension. The wound was closed following langher's lines. The cutaneous layer was closed with interrupted 4-0 vicryl stitches and mattress sutures to ensure a tension free and hemostatic closure. The perineum was again irrigated. The foley was removed.  All instrument, suture, laparotomy, Ray-Tec, and needle counts were correct x2. The patient tolerated the procedure well and was taken recovery room in stable condition.   Jeral Pinch MD Gynecologic Oncology

## 2022-08-12 NOTE — Interval H&P Note (Signed)
History and Physical Interval Note:  08/12/2022 11:49 AM  Danielle Obrien  has presented today for surgery, with the diagnosis of VIN 3.  The various methods of treatment have been discussed with the patient and family. After consideration of risks, benefits and other options for treatment, the patient has consented to  Procedure(s): WIDE LOCAL EXCISION VULVECTOMY (N/A) POSSIBLE CO2 LASER APPLICATION OF THE VULVA (N/A) as a surgical intervention.  The patient's history has been reviewed, patient examined, no change in status, stable for surgery.  I have reviewed the patient's chart and labs.  Questions were answered to the patient's satisfaction.     Lafonda Mosses

## 2022-08-12 NOTE — Telephone Encounter (Signed)
Fax FMLA to [employer]

## 2022-08-12 NOTE — Anesthesia Preprocedure Evaluation (Signed)
Anesthesia Evaluation  Patient identified by MRN, date of birth, ID band Patient awake    Reviewed: Allergy & Precautions, H&P , NPO status , Patient's Chart, lab work & pertinent test results  Airway Mallampati: II   Neck ROM: full    Dental   Pulmonary asthma , Current Smoker,    breath sounds clear to auscultation       Cardiovascular hypertension, + Valvular Problems/Murmurs  Rhythm:regular Rate:Normal  Mechanical aortic valve placed for h/o bicuspid aortic valve.  TTE (01/2022): EF normal.  Mechanical aortic valve functioning normally.   Neuro/Psych  Headaches, PSYCHIATRIC DISORDERS Anxiety Bipolar Disorder    GI/Hepatic GERD  ,  Endo/Other    Renal/GU      Musculoskeletal  (+) Arthritis ,   Abdominal   Peds  Hematology   Anesthesia Other Findings   Reproductive/Obstetrics                             Anesthesia Physical Anesthesia Plan  ASA: 3  Anesthesia Plan: General   Post-op Pain Management:    Induction: Intravenous  PONV Risk Score and Plan: 2 and Ondansetron, Dexamethasone, Midazolam and Treatment may vary due to age or medical condition  Airway Management Planned: LMA  Additional Equipment:   Intra-op Plan:   Post-operative Plan: Extubation in OR  Informed Consent: I have reviewed the patients History and Physical, chart, labs and discussed the procedure including the risks, benefits and alternatives for the proposed anesthesia with the patient or authorized representative who has indicated his/her understanding and acceptance.     Dental advisory given  Plan Discussed with: CRNA, Anesthesiologist and Surgeon  Anesthesia Plan Comments:         Anesthesia Quick Evaluation

## 2022-08-13 ENCOUNTER — Telehealth: Payer: Self-pay

## 2022-08-13 ENCOUNTER — Encounter (HOSPITAL_BASED_OUTPATIENT_CLINIC_OR_DEPARTMENT_OTHER): Payer: Self-pay | Admitting: Gynecologic Oncology

## 2022-08-13 NOTE — Telephone Encounter (Signed)
Spoke with Danielle Obrien this morning. She states she is eating, drinking and urinating well. She has not had a BM yet but is passing gas. She is taking senokot as prescribed and encouraged her to drink plenty of water. She denies fever or chills. Incisions are dry and intact. She rates her pain 5/10. Her pain is controlled with 1 Tylenol and 1 tramadol 4 hours later she will take an Oxycodone she states she had at home.    Instructed to call office with any fever, chills, purulent drainage, uncontrolled pain or any other questions or concerns. Patient verbalizes understanding.   Pt aware of post op appointments as well as the office number [phone] and after hours number [phone] to call if she has any questions or concerns

## 2022-08-13 NOTE — Anesthesia Postprocedure Evaluation (Signed)
Anesthesia Post Note  Patient: Danielle Obrien  Procedure(s) Performed: WIDE LOCAL EXCISION VULVECTOMY (Vagina )     Patient location during evaluation: PACU Anesthesia Type: General Level of consciousness: awake and alert Pain management: pain level controlled Vital Signs Assessment: post-procedure vital signs reviewed and stable Respiratory status: spontaneous breathing, nonlabored ventilation, respiratory function stable and patient connected to nasal cannula oxygen Cardiovascular status: blood pressure returned to baseline and stable Postop Assessment: no apparent nausea or vomiting Anesthetic complications: no   No notable events documented.  Last Vitals:  Vitals:   08/12/22 1339 08/12/22 1415  BP: (!) 148/91 (!) 134/105  Pulse: 70 65  Resp: 15 15  Temp:  36.5 C  SpO2: 98% 97%    Last Pain:  Vitals:   08/12/22 1415  TempSrc:   PainSc: [place]

## 2022-08-14 ENCOUNTER — Other Ambulatory Visit: Payer: Self-pay | Admitting: Internal Medicine

## 2022-08-14 ENCOUNTER — Telehealth: Payer: Self-pay | Admitting: Gynecologic Oncology

## 2022-08-14 ENCOUNTER — Telehealth: Payer: Self-pay

## 2022-08-14 DIAGNOSIS — F3177 Bipolar disorder, in partial remission, most recent episode mixed: Secondary | ICD-10-CM

## 2022-08-14 DIAGNOSIS — F411 Generalized anxiety disorder: Secondary | ICD-10-CM

## 2022-08-14 LAB — SURGICAL PATHOLOGY

## 2022-08-14 MED ORDER — LAMOTRIGINE 200 MG PO TABS: 400.0000 mg | ORAL_TABLET | Freq: Every day | ORAL | 1 refills | Status: DC

## 2022-08-14 NOTE — Telephone Encounter (Signed)
Called patient.  She is recovering from surgery well.  Discussed pathology results.  She is very happy with this news.    Jeral Pinch MD Gynecologic Oncology

## 2022-08-14 NOTE — Telephone Encounter (Signed)
Pt is requesting a refill on: lamoTRIgine (LAMICTAL) 200 MG tablet  Pharmacy: [facility] [id number] - [place], [address]  LOV 07/16/22 ROV 01/14/23

## 2022-08-17 ENCOUNTER — Other Ambulatory Visit: Payer: Self-pay | Admitting: Family Medicine

## 2022-08-18 ENCOUNTER — Ambulatory Visit: Payer: Managed Care, Other (non HMO) | Admitting: Internal Medicine

## 2022-08-19 ENCOUNTER — Other Ambulatory Visit: Payer: Self-pay | Admitting: Cardiology

## 2022-08-19 ENCOUNTER — Ambulatory Visit: Payer: Managed Care, Other (non HMO) | Attending: Cardiovascular Disease

## 2022-08-19 DIAGNOSIS — Z7901 Long term (current) use of anticoagulants: Secondary | ICD-10-CM

## 2022-08-19 DIAGNOSIS — Z954 Presence of other heart-valve replacement: Secondary | ICD-10-CM

## 2022-08-19 DIAGNOSIS — Q230 Congenital stenosis of aortic valve: Secondary | ICD-10-CM

## 2022-08-19 LAB — POCT INR: INR: 2 (ref 2.0–3.0)

## 2022-08-19 NOTE — Patient Instructions (Signed)
Description   *PLEASE PUT ON APPT NO ONSITE INTERPRETER NEEDED*  Take 1.5 tablets today and then continue 1 tablet daily except for 1.5 tablets on Sundays and Thursdays.  Recheck INR in 2 weeks. [facility] [phone]

## 2022-08-25 ENCOUNTER — Other Ambulatory Visit: Payer: Self-pay | Admitting: Family Medicine

## 2022-09-02 ENCOUNTER — Ambulatory Visit: Payer: Managed Care, Other (non HMO) | Attending: Cardiology | Admitting: *Deleted

## 2022-09-02 DIAGNOSIS — Z7901 Long term (current) use of anticoagulants: Secondary | ICD-10-CM | POA: Diagnosis not present

## 2022-09-02 DIAGNOSIS — Q230 Congenital stenosis of aortic valve: Secondary | ICD-10-CM

## 2022-09-02 DIAGNOSIS — Q231 Congenital insufficiency of aortic valve: Secondary | ICD-10-CM

## 2022-09-02 DIAGNOSIS — Z954 Presence of other heart-valve replacement: Secondary | ICD-10-CM

## 2022-09-02 LAB — POCT INR: INR: 2.4 (ref 2.0–3.0)

## 2022-09-02 NOTE — Patient Instructions (Addendum)
Description   *PLEASE PUT ON APPT NO ONSITE INTERPRETER NEEDED*  Continue taking 1 tablet daily except for 1.5 tablets on Sundays and Thursdays. Start eating at least one leafy green veggie a week. Recheck INR in 3 weeks. Coumadin Clinic [phone]

## 2022-09-08 ENCOUNTER — Inpatient Hospital Stay: Payer: Managed Care, Other (non HMO) | Attending: Gynecologic Oncology | Admitting: Gynecologic Oncology

## 2022-09-08 VITALS — BP 177/103 | HR 76 | Temp 97.7°F | Resp 22 | Wt 121.0 lb

## 2022-09-08 DIAGNOSIS — Z7189 Other specified counseling: Secondary | ICD-10-CM

## 2022-09-08 DIAGNOSIS — D071 Carcinoma in situ of vulva: Secondary | ICD-10-CM

## 2022-09-08 DIAGNOSIS — B379 Candidiasis, unspecified: Secondary | ICD-10-CM

## 2022-09-08 MED ORDER — FLUCONAZOLE 150 MG PO TABS: 150.0000 mg | ORAL_TABLET | Freq: Every day | ORAL | 0 refills | Status: DC

## 2022-09-08 NOTE — Progress Notes (Signed)
Gynecologic Oncology Return Clinic Visit  09/08/22  Reason for Visit: follow-up in the setting of vulvar dysplasia  Treatment History: Dysplasia history:  03/19/22: Vaginal cuff biopsy - VAIN1; left buttocks biopsy - VIN3, Perineal biopsy - VIN 2-3, mons biopsy - VIN 2-3. 01/2022 Vaginal pap - negative, HR HPV positive 12/2018: Pap - negative, HR HPV positive (negative for 16, 18 and 45) 05/2017: vaginal biopsies - one with benign squamous mucosa, other with VAIN I 04/2017: Vaginal pap - negative, HR HPV not detected. 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 Vaginal biopsies, one with VAIN 2, other VAIN 1. 10/2015: vaginal cuff biopsy: VAIN II.  Vulvar biopsies performed including mons and right labia, both showing condyloma. 08/2015: Vaginal pap - LSIL, HR HPV positive. 02/2014: Vaginal cuff biopsy showed condylomata acuminata, p16 negative for diffuse positive staining.  No malignancy or high-grade dysplasia.  Right labial biopsy shows condyloma. 01/2014: Vaginal pap - LSIL.  Reactive squamous cells present. HR HPV positive. 01/2009: Pathology from hysterectomy shows chronic mild inflammation of the cervix and endocervix, mesonephric hyperplasia.  No evidence of malignancy.  Secretory type endometrium. 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  Endo and ectocervical margins negative for mucosa but partially denuded.  Stromal invasion noted. 10/2008: Cervical biopsy at 6:00 shows HPV effect consistent with low-grade dysplasia.  ECC with 1 fragment of ectocervical/squamous metaplasia with low-grade intraepithelial lesion and focal changes consistent with CIN-2. 07/2020: ECC shows benign endocervix, no dysplasia. 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  Cervical biopsy at 1:00 showed squamous mucosa with koilocytic atypia, no dysplasia identified.  ECC shows  rare detached fragments of slightly dysplastic squamous mucosa with benign endocervical mucosa.   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  07/23/22: Vulvar biopsy given symptoms at prior healing incision shows VIN3.   08/12/22: Partial simple anterior left vulvectomy.  Interval History: Overall doing so-so.  Final stitch fell out of her incision last week.  She has significant pruritus related to the incision, denies any associated pain.  Has noted some white vaginal discharge and thinks she may have a yeast infection.  Was having some bleeding, which she thinks was from the incision until 5 days ago.  Denies any bleeding since.  Struggled with constipation, is taking Senokot.  Denies any urinary symptoms.  Past Medical/Surgical History: Past Medical History:  Diagnosis Date   ADD (attention deficit disorder)    Alcohol abuse, in remission 2012   per pt in remission since 2012   Anticoagulated on Coumadin    coumadin--- managed by cardiology/ coumadin clinic   Arthritis    In hips   Asthma    allergy induced, rare inhaler use   Auditory processing disorder    per pt very HOH, first language ASL   Bipolar disorder ([place])    Chronic left hip pain    Chronic vertigo    Complication of anesthesia    per pt due to auditory processing disorder pt is unable to hear when waking up and also gets very anxious   GAD (generalized anxiety disorder)    GERD (gastroesophageal reflux disease)    History of cervical dysplasia    History of condyloma acuminatum 2017   s/p laser ablation vulva   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   biopsy and CO2 laser ablation   HOH (  hard of hearing)    per pt very HOH due to auditroy processing disorder   Hyperlipidemia    Hypertension    Muscle spasms of both lower extremities    both hips   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   23 mm Sorin  [employer] [place] bileaflet mechanical valve via right anterior mini thoracotomy   Vulvar dysplasia     Past Surgical History:  Procedure Laterality Date   ANTERIOR CRUCIATE LIGAMENT REPAIR  1993   AORTIC VALVE REPLACEMENT N/A 11/03/2017   Procedure: MINIMALLY INVASIVE AORTIC VALVE REPLACEMENT( MINI THORACOTOMY);  Surgeon: Rexene Alberts, MD;  Location: [facility];  Service: Open Heart Surgery;  Laterality: N/A;   CERVICAL BIOPSY  W/ LOOP ELECTRODE EXCISION  2010   CIN III w/extension to glands   CLOSED REDUCTION HIP DISLOCATION Left 0737   CO2 LASER APPLICATION N/A 12/05/2692   Procedure: CO2 LASER APPLICATION TO VULVA;  Surgeon: Lafonda Mosses, MD;  Location: [facility];  Service: Gynecology;  Laterality: N/A;   COLPOSCOPY  10/30/2008   CIN I & II   COLPOSCOPY  07/31/2000   Neg. ECC   COLPOSCOPY  06/30/2001   CIN I   COLPOSCOPY  08/30/2004   ECC--atypia   COLPOSCOPY N/A 01/21/2016   Procedure: COLPOSCOPY with vaginal biopsy with CO 2 Laser of Vaginal and vulvar condyloma;  Surgeon: [name], MD;  Location: [facility];  Service: Gynecology;  Laterality: N/A;  [name] will be here 2/21 for 1115 case confirmed 01/16/15 - TS   HARDWARE REMOVAL Left 1992   Left hip   INGUINAL HERNIA REPAIR Bilateral    1981;  10982   LEFT AND RIGHT HEART CATHETERIZATION WITH CORONARY ANGIOGRAM N/A 05/18/2014   Procedure: LEFT AND RIGHT HEART CATHETERIZATION WITH CORONARY ANGIOGRAM;  Surgeon: Jettie Booze, MD;  Location: [facility] CATH LAB;  Service: Cardiovascular;  Laterality: N/A;   LESION REMOVAL Right 05/12/2022   Procedure: Maudie Mercury OF RIGHT CHEEK CYST;  Surgeon: Cindra Presume, MD;  Location: [facility];  Service: Plastics;  Laterality: Right;   LYMPH NODE BIOPSY  1995   NASAL SEPTUM SURGERY  2002   ORIF HIP FRACTURE Left 1990   per pt and  took out growth plate in Right knee   ROBOTIC ASSISTED TOTAL HYSTERECTOMY  02/26/2009   '@[facility]'$    TEE WITHOUT  CARDIOVERSION N/A 11/03/2017   Procedure: TRANSESOPHAGEAL ECHOCARDIOGRAM (TEE);  Surgeon: Rexene Alberts, MD;  Location: [place];  Service: Open Heart Surgery;  Laterality: N/A;   TONSILLECTOMY AND ADENOIDECTOMY  1990   TYMPANOSTOMY TUBE PLACEMENT Bilateral    x3  per pt last one [place] BIOPSY N/A 05/12/2022   Procedure: POSSIBLE VULVAR BIOPSY; POSSIBLE VAGINAL BIOPSY;  Surgeon: Lafonda Mosses, MD;  Location: [facility];  Service: Gynecology;  Laterality: N/A;   VULVECTOMY N/A 05/12/2022   Procedure: WIDE EXCISION VULVECTOMY;  Surgeon: Lafonda Mosses, MD;  Location: [facility];  Service: Gynecology;  Laterality: N/A;   VULVECTOMY N/A 08/12/2022   Procedure: WIDE LOCAL EXCISION VULVECTOMY;  Surgeon: Lafonda Mosses, MD;  Location: [facility];  Service: Gynecology;  Laterality: N/A;    Family History  Problem Relation Age of Onset   Hyperlipidemia Mother    Hypertension Mother    Thyroid disease Mother    Hyperlipidemia Father    Hypertension Father    Migraines  Father    Hypertension Brother    Hyperlipidemia Brother    Thyroid disease Maternal Grandmother    Thyroid disease Maternal Grandfather    Prostate cancer Maternal Grandfather    Colon cancer Paternal Great-grandmother     Social History   Socioeconomic History   Marital status: Single    Spouse name: Not on file   Number of children: Not on file   Years of education: Not on file   Highest education level: Not on file  Occupational History   Not on file  Tobacco Use   Smoking status: Every Day    Packs/day: 0.25    Years: 20.00    Total pack years: 5.00    Types: Cigarettes   Smokeless tobacco: Never  Vaping Use   Vaping Use: Never used  Substance and Sexual Activity   Alcohol use: No    Comment: in remission since 2012   Drug use: Yes    Types: Marijuana    Comment: 08-11-22 last, per pt smokes on average 1/4oz per week   Sexual  activity: Yes    Partners: Male    Birth control/protection: Surgical    Comment: Hysterectomy  Other Topics Concern   Not on file  Social History Narrative   Not on file   Social Determinants of Health   Financial Resource Strain: Low Risk  (12/10/2018)   Overall Financial Resource Strain (CARDIA)    Difficulty of Paying Living Expenses: Not hard at all  Food Insecurity: Unknown (12/10/2018)   Hunger Vital Sign    Worried About [place] in the Last Year: Patient refused    [place] in the Last Year: Patient refused  Transportation Needs: Unknown (12/10/2018)   Between - Transportation    Lack of Transportation (Medical): Patient refused    Lack of Transportation (Non-Medical): Patient refused  Physical Activity: Unknown (12/10/2018)   Exercise Vital Sign    Days of Exercise per Week: Patient refused    Minutes of Exercise per Session: Patient refused  Stress: Not on file  Social Connections: Unknown (12/10/2018)   Social Connection and Isolation Panel [NHANES]    Frequency of Communication with Friends and Family: Patient refused    Frequency of Social Gatherings with Friends and Family: Patient refused    Attends Religious Services: Patient refused    Marine scientist or Organizations: Patient refused    Attends Archivist Meetings: Patient refused    Marital Status: Patient refused    Current Medications:  Current Outpatient Medications:    albuterol (VENTOLIN HFA) 108 (90 Base) MCG/ACT inhaler, Inhale 2 puffs into the lungs every 6 (six) hours as needed for wheezing or shortness of breath., Disp: 1 each, Rfl: 3   aspirin EC 81 MG tablet, Take 81 mg by mouth daily. Swallow whole., Disp: , Rfl:    DULoxetine (CYMBALTA) 30 MG capsule, TAKE 1 CAPSULE BY MOUTH DAILY, Disp: 30 capsule, Rfl: 3   fluconazole (DIFLUCAN) 150 MG tablet, Take 1 tablet (150 mg total) by mouth daily., Disp: 1 tablet, Rfl: 0   gabapentin (NEURONTIN) 300 MG capsule, TAKE  TWO CAPSULES BY MOUTH THREE TIMES A DAY, Disp: 540 capsule, Rfl: 0   lamoTRIgine (LAMICTAL) 200 MG tablet, Take 2 tablets (400 mg total) by mouth daily., Disp: 180 tablet, Rfl: 1   lansoprazole (PREVACID) 15 MG capsule, Take 15 mg by mouth daily as needed (for heartburn or acid reflux)., Disp: , Rfl:  lidocaine (XYLOCAINE) 5 % ointment, Apply 1 Application topically as needed for moderate pain (to the vulva)., Disp: 35.44 g, Rfl: 1   meclizine (ANTIVERT) 12.5 MG tablet, TAKE ONE TABLET BY MOUTH TWICE A DAY AS NEEDED FOR DIZZINESS (Patient taking differently: 2 (two) times daily as needed.), Disp: 20 tablet, Rfl: 0   metoprolol tartrate (LOPRESSOR) 25 MG tablet, Take 0.5 tablets (12.5 mg total) by mouth daily., Disp: 45 tablet, Rfl: 3   rosuvastatin (CRESTOR) 20 MG tablet, Take 20 mg by mouth daily., Disp: , Rfl:    senna-docusate (SENOKOT-S) 8.6-50 MG tablet, Take 2 tablets by mouth at bedtime. For AFTER surgery, do not take if having diarrhea, Disp: 30 tablet, Rfl: 1   SSD 1 % cream, APPLY 1 APPLICAITION TOPICALLY DAILY, Disp: 50 g, Rfl: 0   tiZANidine (ZANAFLEX) 4 MG tablet, TAKE 1 TABLET BY MOUTH AT BEDTIME, Disp: 90 tablet, Rfl: 0   traMADol (ULTRAM) 50 MG tablet, TAKE 2 TABLETS BY MOUTH TWICE A DAY, Disp: 120 tablet, Rfl: 0   warfarin (COUMADIN) 7.5 MG tablet, TAKE 1 TO 1 AND 1/2 TABLET BY MOUTH DAILY USE AS DIRECTED BY COUMADIN CLINIC, Disp: 40 tablet, Rfl: 0  Review of Systems: + decreased appetite, fatigue, abdominal pain, constipation, anxiety, depression, vaginal discharge. Denies fevers, chills, unexplained weight changes. Denies hearing loss, neck lumps or masses, mouth sores, ringing in ears or voice changes. Denies cough or wheezing.  Denies shortness of breath. Denies chest pain or palpitations. Denies leg swelling. Denies abdominal distention, blood in stools, diarrhea, nausea, vomiting, or early satiety. Denies pain with intercourse, dysuria, frequency, hematuria or  incontinence. Denies hot flashes, pelvic pain, vaginal bleeding.   Denies joint pain, back pain or muscle pain/cramps. Denies itching, rash, or wounds. Denies dizziness, headaches, numbness or seizures. Denies swollen lymph nodes or glands, denies easy bruising or bleeding. Denies confusion, or decreased concentration.  Physical Exam: BP (!) 177/103 Comment: MD notified, took meds  Pulse 76   Temp 97.7 F (36.5 C) (Oral)   Resp (!) 22   Wt 121 lb (54.9 kg)   BMI 24.44 kg/m  General: Alert, oriented, no acute distress. HEENT: Normocephalic, atraumatic, sclera anicteric. Chest: Unlabored breathing on room air. Extremities: Grossly normal range of motion.  Warm, well perfused.  No edema bilaterally. Skin: No rashes or lesions noted. GU: Normal appearing external genitalia without erythema, excoriation, or lesions.  Incision along the midline and left upper vulva is healing well.  Small skin separation without erythema, induration, or exudate.  There is some thicker white discharge.  Laboratory & Radiologic Studies: A.   FOURCHETTE, POSTERIOR, 6 OCLOCK, BIOPSY:  -    Hyperkeratotic squamous tissue with severe cautery artifact,  precluding further histologic evaluation.   B.   VULVA, LEFT ANTERIOR, EXCISION:  -    High-grade squamous intraepithelial lesion (HSIL/VIN 3), all  margins uninvolved by VIN 3.   C.   MONS, RIGHT, BIOPSY:  -    Dermal features of scar.  -    No squamous dysplasia or malignancy identified.   Assessment & Plan: Danielle Obrien is a 41 y.o. 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  Developed symptoms at the site of her wide local excision in the setting of margin positive for high-grade dysplasia.  Now status post repeat excision with negative margins.  Biopsies from other areas of CO2 laser ablation showed hyperkeratotic squamous tissue, no dysplasia or malignancy.  Overall healing well.  I suspect that  her pruritic symptoms are related to  Candida infection and wound healing.  Prescription sent for 1 dose of Diflucan to her pharmacy.  She also still has lidocaine which she has not been using.  I encouraged her to use this over the next week to help decrease how much she is itching her incision.  We reviewed pathology together.  Margins from her vulvar vision were negative for dysplasia.  Biopsies from previously lasered sites were negative for any dysplasia.  In terms of follow-up, I would like to plan for follow-up initially in 3 months and then will ultimately space visits out to every 6 months and these can be with her OB/GYN.  Discussed again with the patient and her mother the role that HPV has played in her dysplasia history.  Even with adequate treatment, there is a risk of recurrence.  I stressed that this is why we will continue with close surveillance.  We will have a phone visit in 2-3 weeks to assure she has continued improvement from a postoperative standpoint.  Blood pressure was quite elevated today. Patient is asymptomatic. Discussed precautions.  We will keep a log of her blood pressures for [place] to review in a couple of weeks during our phone visit.  The patient has constipation with resulting abdominal pain.  Discussed some strategies today to increase her bowel regimen to improve constipation.  24 minutes of total time was spent for this patient encounter, including preparation, face-to-face counseling with the patient and coordination of care, and documentation of the encounter.  Jeral Pinch, MD  Division of Gynecologic Oncology  Department of Obstetrics and Gynecology  [facility]

## 2022-09-08 NOTE — Progress Notes (Signed)
Patient in room crying, tearful and reporting anxiety and pain. Reports lower abdomen discomfort of 7/10 which she relates to constipation. Has not had bowel movement in 4 days. Reviewed use of Senakot and Miralax.

## 2022-09-08 NOTE — Patient Instructions (Signed)
It was good to see you today.  You are healing well from surgery.  I sent a prescription for Diflucan to your pharmacy.  Please let me know if this does not help with your symptoms.  Please also use the lidocaine over the next week to help decrease how much you are itching the incision.  It overall looks like it is healing very well.  You and I will have a phone visit in 2-3 weeks to check in and make sure that you are fully recovered.  I will then plan to see you in 3 months for follow-up.

## 2022-09-24 ENCOUNTER — Ambulatory Visit: Payer: Managed Care, Other (non HMO) | Attending: Cardiology

## 2022-09-24 ENCOUNTER — Other Ambulatory Visit: Payer: Self-pay | Admitting: Cardiology

## 2022-09-24 ENCOUNTER — Encounter: Payer: Self-pay | Admitting: Gynecologic Oncology

## 2022-09-24 ENCOUNTER — Inpatient Hospital Stay: Payer: Managed Care, Other (non HMO) | Admitting: Gynecologic Oncology

## 2022-09-24 ENCOUNTER — Other Ambulatory Visit: Payer: Self-pay | Admitting: Family Medicine

## 2022-09-24 DIAGNOSIS — Z954 Presence of other heart-valve replacement: Secondary | ICD-10-CM

## 2022-09-24 DIAGNOSIS — Z9079 Acquired absence of other genital organ(s): Secondary | ICD-10-CM

## 2022-09-24 DIAGNOSIS — D071 Carcinoma in situ of vulva: Secondary | ICD-10-CM

## 2022-09-24 DIAGNOSIS — L292 Pruritus vulvae: Secondary | ICD-10-CM

## 2022-09-24 DIAGNOSIS — Q230 Congenital stenosis of aortic valve: Secondary | ICD-10-CM

## 2022-09-24 DIAGNOSIS — Z7901 Long term (current) use of anticoagulants: Secondary | ICD-10-CM

## 2022-09-24 DIAGNOSIS — B379 Candidiasis, unspecified: Secondary | ICD-10-CM

## 2022-09-24 LAB — POCT INR: INR: 1.8 — AB (ref 2.0–3.0)

## 2022-09-24 NOTE — Telephone Encounter (Signed)
Prescription refill request received for warfarin Lov: Monge, 05/08/2022 Next INR check: 11/16 Warfarin tablet strength: 7.5 mg

## 2022-09-24 NOTE — Progress Notes (Signed)
Gynecologic Oncology Telehealth Note: Gyn-Onc  I connected with Danielle Obrien on 09/24/22 at  4:00 PM EDT by telephone and verified that I am speaking with the correct person using two identifiers.  I discussed the limitations, risks, security and privacy concerns of performing an evaluation and management service by telemedicine and the availability of in-person appointments. I also discussed with the patient that there may be a patient responsible charge related to this service. The patient expressed understanding and agreed to proceed.  Other persons participating in the visit and their role in the encounter: none.  Patient's location: home Provider's location: [facility]  Reason for Visit: follow-up  Treatment History: Dysplasia history:  03/19/22: Vaginal cuff biopsy - VAIN1; left buttocks biopsy - VIN3, Perineal biopsy - VIN 2-3, mons biopsy - VIN 2-3. 01/2022 Vaginal pap - negative, HR HPV positive 12/2018: Pap - negative, HR HPV positive (negative for 16, 18 and 45) 05/2017: vaginal biopsies - one with benign squamous mucosa, other with VAIN I 04/2017: Vaginal pap - negative, HR HPV not detected. 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 Vaginal biopsies, one with VAIN 2, other VAIN 1. 10/2015: vaginal cuff biopsy: VAIN II.  Vulvar biopsies performed including mons and right labia, both showing condyloma. 08/2015: Vaginal pap - LSIL, HR HPV positive. 02/2014: Vaginal cuff biopsy showed condylomata acuminata, p16 negative for diffuse positive staining.  No malignancy or high-grade dysplasia.  Right labial biopsy shows condyloma. 01/2014: Vaginal pap - LSIL.  Reactive squamous cells present. HR HPV positive. 01/2009: Pathology from hysterectomy shows chronic mild inflammation of the cervix and endocervix, mesonephric hyperplasia.  No evidence of malignancy.  Secretory type endometrium. 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  Endo and ectocervical margins negative for mucosa but partially denuded.  Stromal invasion noted. 10/2008: Cervical biopsy at 6:00 shows HPV effect consistent with low-grade dysplasia.  ECC with 1 fragment of ectocervical/squamous metaplasia with low-grade intraepithelial lesion and focal changes consistent with CIN-2. 07/2020: ECC shows benign endocervix, no dysplasia. 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  Cervical biopsy at 1:00 showed squamous mucosa with koilocytic atypia, no dysplasia identified.  ECC shows rare detached fragments of slightly dysplastic squamous mucosa with benign endocervical mucosa.   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   07/23/22: Vulvar biopsy given symptoms at prior healing incision shows VIN3.    08/12/22: Partial simple anterior left vulvectomy.  Interval History: Reports doing much better.  Pruritus significantly improved after treatment for Candida.  Having just very mild intermittent pruritus.  Using lidocaine.  Area near her anus continues to be " catching" she walks.  Past Medical/Surgical History: Past Medical History:  Diagnosis Date   ADD (attention deficit disorder)    Alcohol abuse, in remission 2012   per pt in remission since 2012   Anticoagulated on Coumadin    coumadin--- managed by cardiology/ coumadin clinic   Arthritis    In hips   Asthma    allergy induced, rare inhaler use   Auditory processing disorder    per pt very HOH, first language ASL   Bipolar disorder ([place])    Chronic left hip pain    Chronic vertigo    Complication of anesthesia    per pt due to auditory processing disorder pt is unable to hear when waking up and  also gets very anxious   GAD (generalized anxiety disorder)    GERD (gastroesophageal reflux disease)    History of cervical dysplasia    History of condyloma acuminatum 2017   s/p  laser ablation vulva   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   biopsy and CO2 laser ablation   HOH (hard of hearing)    per pt very HOH due to auditroy processing disorder   Hyperlipidemia    Hypertension    Muscle spasms of both lower extremities    both hips   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   23 mm Sorin [employer] [place] bileaflet mechanical valve via right anterior mini thoracotomy   Vulvar dysplasia     Past Surgical History:  Procedure Laterality Date   ANTERIOR CRUCIATE LIGAMENT REPAIR  1993   AORTIC VALVE REPLACEMENT N/A 11/03/2017   Procedure: MINIMALLY INVASIVE AORTIC VALVE REPLACEMENT( MINI THORACOTOMY);  Surgeon: Rexene Alberts, MD;  Location: [facility];  Service: Open Heart Surgery;  Laterality: N/A;   CERVICAL BIOPSY  W/ LOOP ELECTRODE EXCISION  2010   CIN III w/extension to glands   CLOSED REDUCTION HIP DISLOCATION Left 1157   CO2 LASER APPLICATION N/A 2/62/0355   Procedure: CO2 LASER APPLICATION TO VULVA;  Surgeon: Lafonda Mosses, MD;  Location: [facility];  Service: Gynecology;  Laterality: N/A;   COLPOSCOPY  10/30/2008   CIN I & II   COLPOSCOPY  07/31/2000   Neg. ECC   COLPOSCOPY  06/30/2001   CIN I   COLPOSCOPY  08/30/2004   ECC--atypia   COLPOSCOPY N/A 01/21/2016   Procedure: COLPOSCOPY with vaginal biopsy with CO 2 Laser of Vaginal and vulvar condyloma;  Surgeon: [name], MD;  Location: [facility];  Service: Gynecology;  Laterality: N/A;  [name] will be here 2/21 for 1115 case confirmed 01/16/15 - TS   HARDWARE REMOVAL Left 1992   Left hip   INGUINAL HERNIA REPAIR Bilateral    1981;  10982   LEFT AND RIGHT HEART CATHETERIZATION WITH CORONARY ANGIOGRAM N/A 05/18/2014   Procedure: LEFT AND RIGHT HEART CATHETERIZATION WITH CORONARY ANGIOGRAM;  Surgeon: Jettie Booze, MD;  Location: [facility] CATH LAB;  Service: Cardiovascular;  Laterality: N/A;   LESION REMOVAL  Right 05/12/2022   Procedure: Maudie Mercury OF RIGHT CHEEK CYST;  Surgeon: Cindra Presume, MD;  Location: [place];  Service: Plastics;  Laterality: Right;   LYMPH NODE BIOPSY  1995   NASAL SEPTUM SURGERY  2002   ORIF HIP FRACTURE Left 1990   per pt and  took out growth plate in Right knee   ROBOTIC ASSISTED TOTAL HYSTERECTOMY  02/26/2009   '@[facility]'$    TEE WITHOUT CARDIOVERSION N/A 11/03/2017   Procedure: TRANSESOPHAGEAL ECHOCARDIOGRAM (TEE);  Surgeon: Rexene Alberts, MD;  Location: [place];  Service: Open Heart Surgery;  Laterality: N/A;   TONSILLECTOMY AND ADENOIDECTOMY  1990   TYMPANOSTOMY TUBE PLACEMENT Bilateral    x3  per pt last one [place] BIOPSY N/A 05/12/2022   Procedure: POSSIBLE VULVAR BIOPSY; POSSIBLE VAGINAL BIOPSY;  Surgeon: Lafonda Mosses, MD;  Location: [facility];  Service: Gynecology;  Laterality: N/A;   VULVECTOMY N/A 05/12/2022   Procedure: WIDE EXCISION VULVECTOMY;  Surgeon: Lafonda Mosses, MD;  Location: [facility];  Service: Gynecology;  Laterality: N/A;   VULVECTOMY N/A 08/12/2022   Procedure: WIDE LOCAL EXCISION VULVECTOMY;  Surgeon: Berline Lopes,  Corinna Lines, MD;  Location: [facility];  Service: Gynecology;  Laterality: N/A;    Family History  Problem Relation Age of Onset   Hyperlipidemia Mother    Hypertension Mother    Thyroid disease Mother    Hyperlipidemia Father    Hypertension Father    Migraines Father    Hypertension Brother    Hyperlipidemia Brother    Thyroid disease Maternal Grandmother    Thyroid disease Maternal Grandfather    Prostate cancer Maternal Grandfather    Colon cancer Paternal Great-grandmother     Social History   Socioeconomic History   Marital status: Single    Spouse name: Not on file   Number of children: Not on file   Years of education: Not on file   Highest education level: Not on file  Occupational History   Not on file  Tobacco Use    Smoking status: Every Day    Packs/day: 0.25    Years: 20.00    Total pack years: 5.00    Types: Cigarettes   Smokeless tobacco: Never  Vaping Use   Vaping Use: Never used  Substance and Sexual Activity   Alcohol use: No    Comment: in remission since 2012   Drug use: Yes    Types: Marijuana    Comment: 08-11-22 last, per pt smokes on average 1/4oz per week   Sexual activity: Yes    Partners: Male    Birth control/protection: Surgical    Comment: Hysterectomy  Other Topics Concern   Not on file  Social History Narrative   Not on file   Social Determinants of Health   Financial Resource Strain: Low Risk  (12/10/2018)   Overall Financial Resource Strain (CARDIA)    Difficulty of Paying Living Expenses: Not hard at all  Food Insecurity: Unknown (12/10/2018)   Hunger Vital Sign    Worried About Running Out of Food in the Last Year: Patient refused    [place] in the Last Year: Patient refused  Transportation Needs: Unknown (12/10/2018)   Hand - Transportation    Lack of Transportation (Medical): Patient refused    Lack of Transportation (Non-Medical): Patient refused  Physical Activity: Unknown (12/10/2018)   Exercise Vital Sign    Days of Exercise per Week: Patient refused    Minutes of Exercise per Session: Patient refused  Stress: Not on file  Social Connections: Unknown (12/10/2018)   Social Connection and Isolation Panel [NHANES]    Frequency of Communication with Friends and Family: Patient refused    Frequency of Social Gatherings with Friends and Family: Patient refused    Attends Religious Services: Patient refused    Marine scientist or Organizations: Patient refused    Attends Archivist Meetings: Patient refused    Marital Status: Patient refused    Current Medications:  Current Outpatient Medications:    albuterol (VENTOLIN HFA) 108 (90 Base) MCG/ACT inhaler, Inhale 2 puffs into the lungs every 6 (six) hours as needed for wheezing or  shortness of breath., Disp: 1 each, Rfl: 3   aspirin EC 81 MG tablet, Take 81 mg by mouth daily. Swallow whole., Disp: , Rfl:    DULoxetine (CYMBALTA) 30 MG capsule, TAKE 1 CAPSULE BY MOUTH DAILY, Disp: 30 capsule, Rfl: 3   fluconazole (DIFLUCAN) 150 MG tablet, Take 1 tablet (150 mg total) by mouth daily., Disp: 1 tablet, Rfl: 0   gabapentin (NEURONTIN) 300 MG capsule, TAKE TWO CAPSULES BY MOUTH THREE  TIMES A DAY, Disp: 540 capsule, Rfl: 0   lamoTRIgine (LAMICTAL) 200 MG tablet, Take 2 tablets (400 mg total) by mouth daily., Disp: 180 tablet, Rfl: 1   lansoprazole (PREVACID) 15 MG capsule, Take 15 mg by mouth daily as needed (for heartburn or acid reflux)., Disp: , Rfl:    lidocaine (XYLOCAINE) 5 % ointment, Apply 1 Application topically as needed for moderate pain (to the vulva)., Disp: 35.44 g, Rfl: 1   meclizine (ANTIVERT) 12.5 MG tablet, TAKE ONE TABLET BY MOUTH TWICE A DAY AS NEEDED FOR DIZZINESS (Patient taking differently: 2 (two) times daily as needed.), Disp: 20 tablet, Rfl: 0   metoprolol tartrate (LOPRESSOR) 25 MG tablet, Take 0.5 tablets (12.5 mg total) by mouth daily., Disp: 45 tablet, Rfl: 3   rosuvastatin (CRESTOR) 20 MG tablet, Take 20 mg by mouth daily., Disp: , Rfl:    senna-docusate (SENOKOT-S) 8.6-50 MG tablet, Take 2 tablets by mouth at bedtime. For AFTER surgery, do not take if having diarrhea, Disp: 30 tablet, Rfl: 1   SSD 1 % cream, APPLY 1 APPLICAITION TOPICALLY DAILY, Disp: 50 g, Rfl: 0   tiZANidine (ZANAFLEX) 4 MG tablet, TAKE 1 TABLET BY MOUTH AT BEDTIME, Disp: 90 tablet, Rfl: 0   traMADol (ULTRAM) 50 MG tablet, TAKE 2 TABLETS BY MOUTH TWICE A DAY, Disp: 120 tablet, Rfl: 0   warfarin (COUMADIN) 7.5 MG tablet, TAKE ONE TO ONE AND ONE-HALF (1-1.5) TABLETS BY MOUTH DAILY AS DIRECTED BY COUMADIN CLINIC, Disp: 40 tablet, Rfl: 0  Review of Symptoms: Pertinent positives as per HPI.  Physical Exam: Deferred given limitations of phone visit.  Laboratory & Radiologic  Studies: None new  Assessment & Plan: Danielle Obrien is a 41 y.o. 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  Developed symptoms at the site of her wide local excision in the setting of margin positive for high-grade dysplasia.  Now status post repeat excision with negative margins.  Biopsies from other areas of CO2 laser ablation showed hyperkeratotic squamous tissue, no dysplasia or malignancy.  Doing much better after treatment for Candida.  Discussed using Desitin or Vaseline as a barrier cream for the perineal area.  Of asked her to call me before her return to work date, which is November 8, to give me an update.  I discussed the assessment and treatment plan with the patient. The patient was provided with an opportunity to ask questions and all were answered. The patient agreed with the plan and demonstrated an understanding of the instructions.   The patient was advised to call back or see an in-person evaluation if the symptoms worsen or if the condition fails to improve as anticipated.   8 minutes of total time was spent for this patient encounter, including preparation, phone counseling with the patient and coordination of care, and documentation of the encounter.   Jeral Pinch, MD  Division of Gynecologic Oncology  Department of Obstetrics and Gynecology  [facility]

## 2022-09-24 NOTE — Patient Instructions (Signed)
Description   *PLEASE PUT ON APPT NO ONSITE INTERPRETER NEEDED*   Take 2 tablets today and then START taking 1 tablet daily except for 1.5 tablets on Sundays, Tuesdays, Thursdays.  Stay consistent with greens each week (2-3 per week)  Recheck INR in 3 weeks. Coumadin Clinic [phone]

## 2022-10-02 ENCOUNTER — Ambulatory Visit: Payer: Managed Care, Other (non HMO) | Admitting: Gynecologic Oncology

## 2022-10-08 ENCOUNTER — Encounter: Payer: Self-pay | Admitting: Gynecologic Oncology

## 2022-10-08 ENCOUNTER — Telehealth: Payer: Self-pay

## 2022-10-08 NOTE — Telephone Encounter (Signed)
Pt called to give an update. She states she is much better. The spot at her buttocks is still tender and she still uses the Lidocaine. Ms. Mccullum was not sure if Dr.Tucker wanted her to come in to be seen again before returning to work? She is ready to go back to work. They have her scheduled for 10 hour shifts this weekend. She does need a return to work note faxed to [phone]

## 2022-10-08 NOTE — Telephone Encounter (Signed)
Return to work letter faxed and confirmed.   Pt aware of Dr.Tucker not needing to see her for a follow up.

## 2022-10-08 NOTE — Telephone Encounter (Signed)
Thanks Kim. I don't need to see her before she goes back to work

## 2022-10-12 NOTE — Progress Notes (Unsigned)
   I, Peterson Lombard, LAT, ATC acting as a scribe for Lynne Leader, MD.  Danielle Obrien is a 41 y.o. female who presents to [place] at [facility] today for R foot pain. Pt was previously seen by Dr. Tamala Julian on 11/17/21 for L knee and neck pain. Today, pt presents w/ R foot pain x /. Pt locates pain to   R foot swelling: Aggravates: Treatments tried:  Pertinent review of systems: ***  Relevant historical information: ***   Exam:  There were no vitals taken for this visit. General: Well Developed, well nourished, and in no acute distress.   MSK: ***    Lab and Radiology Results No results found for this or any previous visit (from the past 72 hour(s)). No results found.     Assessment and Plan: 41 y.o. female with ***   PDMP not reviewed this encounter. No orders of the defined types were placed in this encounter.  No orders of the defined types were placed in this encounter.    Discussed warning signs or symptoms. Please see discharge instructions. Patient expresses understanding.   ***

## 2022-10-13 ENCOUNTER — Ambulatory Visit (INDEPENDENT_AMBULATORY_CARE_PROVIDER_SITE_OTHER): Payer: Managed Care, Other (non HMO) | Admitting: Family Medicine

## 2022-10-13 ENCOUNTER — Ambulatory Visit (INDEPENDENT_AMBULATORY_CARE_PROVIDER_SITE_OTHER): Payer: Managed Care, Other (non HMO) | Admitting: *Deleted

## 2022-10-13 ENCOUNTER — Ambulatory Visit (INDEPENDENT_AMBULATORY_CARE_PROVIDER_SITE_OTHER): Payer: Managed Care, Other (non HMO)

## 2022-10-13 VITALS — BP 158/94 | HR 89 | Ht 59.0 in | Wt 118.0 lb

## 2022-10-13 DIAGNOSIS — Q231 Congenital insufficiency of aortic valve: Secondary | ICD-10-CM

## 2022-10-13 DIAGNOSIS — Z7901 Long term (current) use of anticoagulants: Secondary | ICD-10-CM

## 2022-10-13 DIAGNOSIS — T148XXA Other injury of unspecified body region, initial encounter: Secondary | ICD-10-CM | POA: Diagnosis not present

## 2022-10-13 DIAGNOSIS — Q230 Congenital stenosis of aortic valve: Secondary | ICD-10-CM

## 2022-10-13 DIAGNOSIS — Z954 Presence of other heart-valve replacement: Secondary | ICD-10-CM | POA: Diagnosis not present

## 2022-10-13 DIAGNOSIS — Z5181 Encounter for therapeutic drug level monitoring: Secondary | ICD-10-CM | POA: Diagnosis not present

## 2022-10-13 DIAGNOSIS — M79671 Pain in right foot: Secondary | ICD-10-CM | POA: Diagnosis not present

## 2022-10-13 LAB — PROTIME-INR
INR: 3.7 ratio — ABNORMAL HIGH (ref 0.8–1.0)
Prothrombin Time: 37.5 s — ABNORMAL HIGH (ref 9.6–13.1)

## 2022-10-13 LAB — CBC
HCT: 44.8 % (ref 36.0–46.0)
Hemoglobin: 15.2 g/dL — ABNORMAL HIGH (ref 12.0–15.0)
MCHC: 34.1 g/dL (ref 30.0–36.0)
MCV: 91.8 fl (ref 78.0–100.0)
Platelets: 98 10*3/uL — ABNORMAL LOW (ref 150.0–400.0)
RBC: 4.88 Mil/uL (ref 3.87–5.11)
RDW: 13 % (ref 11.5–15.5)
WBC: 4.2 10*3/uL (ref 4.0–10.5)

## 2022-10-13 NOTE — Patient Instructions (Signed)
Description   *PLEASE PUT ON APPT NO ONSITE INTERPRETER NEEDED*   Spoke with pt and advised to hold tomorrow's dose (already taken today's dose) then start taking 1 tablet daily except for 1.5 tablets on Sundays and Thursdays. Stay consistent with greens each week (2-3 per week) Recheck INR in 2 weeks. Coumadin Clinic [phone]

## 2022-10-13 NOTE — Progress Notes (Signed)
INR is a little elevated at 3.7.  Recommend decreasing from 1.5 tablets by mouth daily to just 1 tablet daily until you see your Coumadin clinic on Thursday.

## 2022-10-13 NOTE — Patient Instructions (Addendum)
Thank you for coming in today.   Use an ankle brace.   If you need to use a cam walker boot.   Ice and elevation would help.   Ankle Sprain, Phase I Rehab An ankle sprain is an injury to the ligaments of your ankle. Ankle sprains cause stiffness, loss of motion, and loss of strength. Ask your health care provider which exercises are safe for you. Do exercises exactly as told by your health care provider and adjust them as directed. It is normal to feel mild stretching, pulling, tightness, or discomfort as you do these exercises. Stop right away if you feel sudden pain or your pain gets worse. Do not begin these exercises until told by your health care provider. Stretching and range-of-motion exercises These exercises warm up your muscles and joints and improve the movement and flexibility of your lower leg and ankle. These exercises also help to relieve pain and stiffness. Gastroc and soleus stretch This exercise is also called a calf stretch. It stretches the muscles in the back of the lower leg. These muscles are the gastrocnemius, or gastroc, and the soleus. Sit on the floor with your left / right leg extended. Loop a belt or towel around the ball of your left / right foot. The ball of your foot is on the walking surface, right under your toes. Keep your left / right ankle and foot relaxed and keep your knee straight while you use the belt or towel to pull your foot toward you. You should feel a gentle stretch behind your calf or knee in your gastroc muscle. Hold this position for __________ seconds, then release to the starting position. Repeat the exercise with your knee bent. You can put a pillow or a rolled bath towel under your knee to support it. You should feel a stretch deep in your calf in the soleus muscle or at your Achilles tendon. Repeat __________ times. Complete this exercise __________ times a day. Ankle alphabet  Sit with your left / right leg supported at the lower leg. Do  not rest your foot on anything. Make sure your foot has room to move freely. Think of your left / right foot as a paintbrush. Move your foot to trace each letter of the alphabet in the air. Keep your hip and knee still while you trace. Make the letters as large as you can without feeling discomfort. Trace every letter from A to Z. Repeat __________ times. Complete this exercise __________ times a day. Strengthening exercises These exercises build strength and endurance in your ankle and lower leg. Endurance is the ability to use your muscles for a long time, even after they get tired. Ankle dorsiflexion  Secure a rubber exercise band or tube to an object, such as a table leg, that will stay still when the band is pulled. Secure the other end around your left / right foot. Sit on the floor facing the object, with your left / right leg extended. The band or tube should be slightly tense when your foot is relaxed. Slowly bring your foot toward you, bringing the top of your foot toward your shin (dorsiflexion), and pulling the band tighter. Hold this position for __________ seconds. Slowly return your foot to the starting position. Repeat __________ times. Complete this exercise __________ times a day. Ankle plantar flexion  Sit on the floor with your left / right leg extended. Loop a rubber exercise tube or band around the ball of your left / right foot.  The ball of your foot is on the walking surface, right under your toes. Hold the ends of the band or tube in your hands. The band or tube should be slightly tense when your foot is relaxed. Slowly point your foot and toes downward to tilt the top of your foot away from your shin (plantar flexion). Hold this position for __________ seconds. Slowly return your foot to the starting position. Repeat __________ times. Complete this exercise __________ times a day. Ankle eversion Sit on the floor with your legs straight out in front of you. Loop  a rubber exercise band or tube around the ball of your left / right foot. The ball of your foot is on the walking surface, right under your toes. Hold the ends of the band in your hands, or secure the band to a stable object. The band or tube should be slightly tense when your foot is relaxed. Slowly push your foot outward, away from your other leg (eversion). Hold this position for __________ seconds. Slowly return your foot to the starting position. Repeat __________ times. Complete this exercise __________ times a day. This information is not intended to replace advice given to you by your health care provider. Make sure you discuss any questions you have with your health care provider. Document Revised: 01/07/2021 Document Reviewed: 01/09/2021 Elsevier Patient Education  [place].

## 2022-10-14 NOTE — Progress Notes (Signed)
Right foot x-ray shows a little bit of arthritis at the big toe.

## 2022-10-14 NOTE — Progress Notes (Signed)
Right ankle x-ray shows no fracture.  Looks okay to radiology.

## 2022-10-15 ENCOUNTER — Ambulatory Visit: Payer: Managed Care, Other (non HMO) | Admitting: *Deleted

## 2022-10-15 ENCOUNTER — Telehealth: Payer: Self-pay

## 2022-10-15 ENCOUNTER — Other Ambulatory Visit: Payer: Self-pay | Admitting: Family Medicine

## 2022-10-15 ENCOUNTER — Ambulatory Visit: Payer: Managed Care, Other (non HMO) | Admitting: Family Medicine

## 2022-10-15 DIAGNOSIS — Z23 Encounter for immunization: Secondary | ICD-10-CM

## 2022-10-15 NOTE — Telephone Encounter (Signed)
Short term disability forms faxed/confirmed [place] [employer]

## 2022-10-23 ENCOUNTER — Ambulatory Visit: Payer: Managed Care, Other (non HMO) | Admitting: Gynecologic Oncology

## 2022-10-27 ENCOUNTER — Other Ambulatory Visit: Payer: Self-pay | Admitting: Family Medicine

## 2022-10-29 ENCOUNTER — Ambulatory Visit: Payer: Managed Care, Other (non HMO) | Attending: Cardiology | Admitting: *Deleted

## 2022-10-29 DIAGNOSIS — Q230 Congenital stenosis of aortic valve: Secondary | ICD-10-CM | POA: Diagnosis not present

## 2022-10-29 DIAGNOSIS — Z954 Presence of other heart-valve replacement: Secondary | ICD-10-CM

## 2022-10-29 DIAGNOSIS — Z7901 Long term (current) use of anticoagulants: Secondary | ICD-10-CM

## 2022-10-29 DIAGNOSIS — Q231 Congenital insufficiency of aortic valve: Secondary | ICD-10-CM | POA: Diagnosis not present

## 2022-10-29 LAB — POCT INR: INR: 3 (ref 2.0–3.0)

## 2022-10-29 NOTE — Patient Instructions (Signed)
Description   *PLEASE PUT ON APPT NO ONSITE INTERPRETER NEEDED*   Tomorrow take 1/2 tablet of warfarin then continue taking 1 tablet daily except for 1.5 tablets on Sundays and Thursdays. Stay consistent with greens each week (2-3 per week) Recheck INR in 3 weeks. Coumadin Clinic [phone]

## 2022-11-03 ENCOUNTER — Other Ambulatory Visit: Payer: Self-pay | Admitting: Cardiology

## 2022-11-03 DIAGNOSIS — Z7901 Long term (current) use of anticoagulants: Secondary | ICD-10-CM

## 2022-11-03 DIAGNOSIS — Q230 Congenital stenosis of aortic valve: Secondary | ICD-10-CM

## 2022-11-03 DIAGNOSIS — Z954 Presence of other heart-valve replacement: Secondary | ICD-10-CM

## 2022-11-04 ENCOUNTER — Telehealth: Payer: Self-pay

## 2022-11-04 NOTE — Telephone Encounter (Addendum)
Patient called to report that this weekend she nicked her vaginal area with her fingernail and it is now swollen and is painful to wipe. Reports pain at 9/10. No bleeding.  History: 08/12/2022 - Wide local Excision Vulvectomy  Per [name] NP, called patient to advise that she can use her lidocaine cream and neosporin ointment to vaginal area. She voiced understanding

## 2022-11-10 ENCOUNTER — Other Ambulatory Visit: Payer: Self-pay | Admitting: Family Medicine

## 2022-11-10 ENCOUNTER — Other Ambulatory Visit: Payer: Self-pay

## 2022-11-10 MED ORDER — TIZANIDINE HCL 4 MG PO TABS: 4.0000 mg | ORAL_TABLET | Freq: Every day | ORAL | 0 refills | Status: DC

## 2022-11-19 ENCOUNTER — Ambulatory Visit: Payer: Managed Care, Other (non HMO) | Attending: Internal Medicine

## 2022-11-19 DIAGNOSIS — Z7901 Long term (current) use of anticoagulants: Secondary | ICD-10-CM

## 2022-11-19 DIAGNOSIS — Z954 Presence of other heart-valve replacement: Secondary | ICD-10-CM | POA: Diagnosis not present

## 2022-11-19 LAB — POCT INR: INR: 1.7 — AB (ref 2.0–3.0)

## 2022-11-19 NOTE — Patient Instructions (Addendum)
Description   *PLEASE PUT ON APPT NO ONSITE INTERPRETER NEEDED*   Take 2 tablets today and then continue taking 1 tablet daily except for 1.5 tablets on Sundays and Thursdays.  Stay consistent with greens each week (2-3 per week)  Recheck INR in 4 weeks.  Coumadin Clinic [phone]

## 2022-12-07 ENCOUNTER — Other Ambulatory Visit: Payer: Self-pay | Admitting: Family Medicine

## 2022-12-07 ENCOUNTER — Other Ambulatory Visit: Payer: Self-pay | Admitting: Cardiology

## 2022-12-07 DIAGNOSIS — Z954 Presence of other heart-valve replacement: Secondary | ICD-10-CM

## 2022-12-07 DIAGNOSIS — Q230 Congenital stenosis of aortic valve: Secondary | ICD-10-CM

## 2022-12-07 DIAGNOSIS — Z7901 Long term (current) use of anticoagulants: Secondary | ICD-10-CM

## 2022-12-11 ENCOUNTER — Encounter: Payer: Self-pay | Admitting: Gynecologic Oncology

## 2022-12-11 ENCOUNTER — Telehealth: Payer: Self-pay | Admitting: Surgery

## 2022-12-11 ENCOUNTER — Other Ambulatory Visit: Payer: Self-pay

## 2022-12-11 ENCOUNTER — Inpatient Hospital Stay: Payer: Managed Care, Other (non HMO) | Attending: Gynecologic Oncology | Admitting: Gynecologic Oncology

## 2022-12-11 VITALS — BP 151/91 | HR 64 | Temp 98.3°F | Resp 16 | Wt 116.2 lb

## 2022-12-11 DIAGNOSIS — B977 Papillomavirus as the cause of diseases classified elsewhere: Secondary | ICD-10-CM | POA: Insufficient documentation

## 2022-12-11 DIAGNOSIS — D071 Carcinoma in situ of vulva: Secondary | ICD-10-CM | POA: Diagnosis present

## 2022-12-11 DIAGNOSIS — Z9079 Acquired absence of other genital organ(s): Secondary | ICD-10-CM | POA: Diagnosis not present

## 2022-12-11 DIAGNOSIS — N9089 Other specified noninflammatory disorders of vulva and perineum: Secondary | ICD-10-CM | POA: Insufficient documentation

## 2022-12-11 DIAGNOSIS — F418 Other specified anxiety disorders: Secondary | ICD-10-CM

## 2022-12-11 MED ORDER — LORAZEPAM 1 MG PO TABS: 1.0000 mg | ORAL_TABLET | Freq: Once | ORAL | 0 refills | Status: AC

## 2022-12-11 MED ORDER — CLOBETASOL PROPIONATE 0.05 % EX OINT: 1.0000 | TOPICAL_OINTMENT | Freq: Two times a day (BID) | CUTANEOUS | 0 refills | Status: DC

## 2022-12-11 NOTE — Progress Notes (Signed)
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 01/2022 Vaginal pap - negative, HR HPV positive 12/2018: Pap - negative, HR HPV positive (negative for 16, 18 and 45) 05/2017: vaginal biopsies - one with benign squamous mucosa, other with VAIN I 04/2017: Vaginal pap - negative, HR HPV not detected. 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 Vaginal biopsies, one with VAIN 2, other VAIN 1. 10/2015: vaginal cuff biopsy: VAIN II.  Vulvar biopsies performed including mons and right labia, both showing condyloma. 08/2015: Vaginal pap - LSIL, HR HPV positive. 02/2014: Vaginal cuff biopsy showed condylomata acuminata, p16 negative for diffuse positive staining.  No malignancy or high-grade dysplasia.  Right labial biopsy shows condyloma. 01/2014: Vaginal pap - LSIL.  Reactive squamous cells present. HR HPV positive. 01/2009: Pathology from hysterectomy shows chronic mild inflammation of the cervix and endocervix, mesonephric hyperplasia.  No evidence of malignancy.  Secretory type endometrium. 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  Endo and ectocervical margins negative for mucosa but partially denuded.  Stromal invasion noted. 10/2008: Cervical biopsy at 6:00 shows HPV effect consistent with low-grade dysplasia.  ECC with 1 fragment of ectocervical/squamous metaplasia with low-grade intraepithelial lesion and focal changes consistent with CIN-2. 07/2020: ECC shows benign endocervix, no dysplasia. 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  Cervical biopsy at 1:00 showed squamous mucosa with koilocytic atypia, no dysplasia identified.  ECC shows  rare detached fragments of slightly dysplastic squamous mucosa with benign endocervical mucosa.   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   07/23/22: Vulvar biopsy given symptoms at prior healing incision shows VIN3.    08/12/22: Partial simple anterior left vulvectomy.  Interval History: Patient reports overall doing well.  She has 1 area near her anus on the perineum that has intermittent spotting.  It gets irritated with wiping and she has to use special wipes instead of toilet paper.  Has minimal pruritus today, otherwise denies vulvar pruritus.  Past Medical/Surgical History: Past Medical History:  Diagnosis Date   ADD (attention deficit disorder)    Alcohol abuse, in remission 2012   per pt in remission since 2012   Anticoagulated on Coumadin    coumadin--- managed by cardiology/ coumadin clinic   Arthritis    In hips   Asthma    allergy induced, rare inhaler use   Auditory processing disorder    per pt very HOH, first language ASL   Bipolar disorder ([place])    Chronic left hip pain    Chronic vertigo    Complication of anesthesia    per pt due to auditory processing disorder pt is unable to hear when waking up and also gets very anxious   GAD (generalized anxiety disorder)    GERD (gastroesophageal reflux disease)    History of cervical dysplasia    History of condyloma acuminatum 2017   s/p laser ablation vulva   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   biopsy and CO2 laser ablation   HOH (hard of hearing)    per pt very HOH due to auditroy processing disorder   Hyperlipidemia    Hypertension    Muscle  spasms of both lower extremities    both hips   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   23 mm Sorin [employer] [place] bileaflet mechanical valve via right anterior mini thoracotomy   Vulvar dysplasia     Past Surgical History:  Procedure Laterality Date    ANTERIOR CRUCIATE LIGAMENT REPAIR  1993   AORTIC VALVE REPLACEMENT N/A 11/03/2017   Procedure: MINIMALLY INVASIVE AORTIC VALVE REPLACEMENT( MINI THORACOTOMY);  Surgeon: Rexene Alberts, MD;  Location: [place];  Service: Open Heart Surgery;  Laterality: N/A;   CERVICAL BIOPSY  W/ LOOP ELECTRODE EXCISION  2010   CIN III w/extension to glands   CLOSED REDUCTION HIP DISLOCATION Left 0093   CO2 LASER APPLICATION N/A 07/17/2992   Procedure: CO2 LASER APPLICATION TO VULVA;  Surgeon: Lafonda Mosses, MD;  Location: [facility];  Service: Gynecology;  Laterality: N/A;   COLPOSCOPY  10/30/2008   CIN I & II   COLPOSCOPY  07/31/2000   Neg. ECC   COLPOSCOPY  06/30/2001   CIN I   COLPOSCOPY  08/30/2004   ECC--atypia   COLPOSCOPY N/A 01/21/2016   Procedure: COLPOSCOPY with vaginal biopsy with CO 2 Laser of Vaginal and vulvar condyloma;  Surgeon: [name], MD;  Location: [facility];  Service: Gynecology;  Laterality: N/A;  [name] will be here 2/21 for 1115 case confirmed 01/16/15 - TS   HARDWARE REMOVAL Left 1992   Left hip   INGUINAL HERNIA REPAIR Bilateral    1981;  [id number]   LEFT AND RIGHT HEART CATHETERIZATION WITH CORONARY ANGIOGRAM N/A 05/18/2014   Procedure: LEFT AND RIGHT HEART CATHETERIZATION WITH CORONARY ANGIOGRAM;  Surgeon: Jettie Booze, MD;  Location: [facility] CATH LAB;  Service: Cardiovascular;  Laterality: N/A;   LESION REMOVAL Right 05/12/2022   Procedure: Maudie Mercury OF RIGHT CHEEK CYST;  Surgeon: Cindra Presume, MD;  Location: [place];  Service: Plastics;  Laterality: Right;   LYMPH NODE BIOPSY  1995   NASAL SEPTUM SURGERY  2002   ORIF HIP FRACTURE Left 1990   per pt and  took out growth plate in Right knee   ROBOTIC ASSISTED TOTAL HYSTERECTOMY  02/26/2009   '@[facility]'$    TEE WITHOUT CARDIOVERSION N/A 11/03/2017   Procedure: TRANSESOPHAGEAL ECHOCARDIOGRAM (TEE);  Surgeon: Rexene Alberts, MD;  Location: [place];  Service: Open Heart Surgery;   Laterality: N/A;   TONSILLECTOMY AND ADENOIDECTOMY  1990   TYMPANOSTOMY TUBE PLACEMENT Bilateral    x3  per pt last one [place] BIOPSY N/A 05/12/2022   Procedure: POSSIBLE VULVAR BIOPSY; POSSIBLE VAGINAL BIOPSY;  Surgeon: Lafonda Mosses, MD;  Location: [facility];  Service: Gynecology;  Laterality: N/A;   VULVECTOMY N/A 05/12/2022   Procedure: WIDE EXCISION VULVECTOMY;  Surgeon: Lafonda Mosses, MD;  Location: [facility];  Service: Gynecology;  Laterality: N/A;   VULVECTOMY N/A 08/12/2022   Procedure: WIDE LOCAL EXCISION VULVECTOMY;  Surgeon: Lafonda Mosses, MD;  Location: [facility];  Service: Gynecology;  Laterality: N/A;    Family History  Problem Relation Age of Onset   Hyperlipidemia Mother    Hypertension Mother    Thyroid disease Mother    Hyperlipidemia Father    Hypertension Father    Migraines Father    Hypertension Brother    Hyperlipidemia Brother    Thyroid disease Maternal Grandmother    Thyroid disease Maternal Grandfather  Prostate cancer Maternal Grandfather    Colon cancer Paternal Great-grandmother     Social History   Socioeconomic History   Marital status: Single    Spouse name: Not on file   Number of children: Not on file   Years of education: Not on file   Highest education level: Not on file  Occupational History   Not on file  Tobacco Use   Smoking status: Every Day    Packs/day: 0.25    Years: 20.00    Total pack years: 5.00    Types: Cigarettes   Smokeless tobacco: Never  Vaping Use   Vaping Use: Never used  Substance and Sexual Activity   Alcohol use: No    Comment: in remission since 2012   Drug use: Yes    Types: Marijuana    Comment: 08-11-22 last, per pt smokes on average 1/4oz per week   Sexual activity: Yes    Partners: Male    Birth control/protection: Surgical    Comment: Hysterectomy  Other Topics Concern   Not on file  Social History Narrative    Not on file   Social Determinants of Health   Financial Resource Strain: Low Risk  (12/10/2018)   Overall Financial Resource Strain (CARDIA)    Difficulty of Paying Living Expenses: Not hard at all  Food Insecurity: Unknown (12/10/2018)   Hunger Vital Sign    Worried About [place] in the Last Year: Patient refused    [place] in the Last Year: Patient refused  Transportation Needs: Unknown (12/10/2018)   [place] - Transportation    Lack of Transportation (Medical): Patient refused    Lack of Transportation (Non-Medical): Patient refused  Physical Activity: Unknown (12/10/2018)   Exercise Vital Sign    Days of Exercise per Week: Patient refused    Minutes of Exercise per Session: Patient refused  Stress: Not on file  Social Connections: Unknown (12/10/2018)   Social Connection and Isolation Panel [NHANES]    Frequency of Communication with Friends and Family: Patient refused    Frequency of Social Gatherings with Friends and Family: Patient refused    Attends Religious Services: Patient refused    Marine scientist or Organizations: Patient refused    Attends Archivist Meetings: Patient refused    Marital Status: Patient refused    Current Medications:  Current Outpatient Medications:    albuterol (VENTOLIN HFA) 108 (90 Base) MCG/ACT inhaler, Inhale 2 puffs into the lungs every 6 (six) hours as needed for wheezing or shortness of breath., Disp: 1 each, Rfl: 3   aspirin EC 81 MG tablet, Take 81 mg by mouth daily. Swallow whole., Disp: , Rfl:    clobetasol ointment (TEMOVATE) 2.45 %, Apply 1 Application topically 2 (two) times daily., Disp: 30 g, Rfl: 0   DULoxetine (CYMBALTA) 30 MG capsule, TAKE 1 CAPSULE BY MOUTH DAILY, Disp: 30 capsule, Rfl: 3   fluconazole (DIFLUCAN) 150 MG tablet, Take 1 tablet (150 mg total) by mouth daily., Disp: 1 tablet, Rfl: 0   gabapentin (NEURONTIN) 300 MG capsule, TAKE TWO CAPSULES BY MOUTH THREE TIMES A DAY, Disp: 540  capsule, Rfl: 0   lamoTRIgine (LAMICTAL) 200 MG tablet, Take 2 tablets (400 mg total) by mouth daily., Disp: 180 tablet, Rfl: 1   lansoprazole (PREVACID) 15 MG capsule, Take 15 mg by mouth daily as needed (for heartburn or acid reflux)., Disp: , Rfl:    lidocaine (XYLOCAINE) 5 % ointment, Apply 1 Application  topically as needed for moderate pain (to the vulva)., Disp: 35.44 g, Rfl: 1   meclizine (ANTIVERT) 12.5 MG tablet, TAKE ONE TABLET BY MOUTH TWICE A DAY AS NEEDED FOR DIZZINESS (Patient taking differently: 2 (two) times daily as needed.), Disp: 20 tablet, Rfl: 0   metoprolol tartrate (LOPRESSOR) 25 MG tablet, Take 0.5 tablets (12.5 mg total) by mouth daily., Disp: 45 tablet, Rfl: 3   rosuvastatin (CRESTOR) 20 MG tablet, Take 20 mg by mouth daily., Disp: , Rfl:    SSD 1 % cream, APPLY 1 APPLICAITION TOPICALLY DAILY, Disp: 50 g, Rfl: 0   tiZANidine (ZANAFLEX) 4 MG tablet, Take 1 tablet (4 mg total) by mouth at bedtime., Disp: 30 tablet, Rfl: 0   traMADol (ULTRAM) 50 MG tablet, TAKE 2 TABLETS BY MOUTH TWICE A DAY, Disp: 120 tablet, Rfl: 0   warfarin (COUMADIN) 7.5 MG tablet, TAKE ONE TO ONE AND ONE-HALF (1-1.5) TABLETS BY MOUTH DAILY AS DIRECTED BY COUMADIN CLINIC, Disp: 40 tablet, Rfl: 0  Review of Systems: Denies appetite changes, fevers, chills, fatigue, unexplained weight changes. Denies hearing loss, neck lumps or masses, mouth sores, ringing in ears or voice changes. Denies cough or wheezing.  Denies shortness of breath. Denies chest pain or palpitations. Denies leg swelling. Denies abdominal distention, pain, blood in stools, constipation, diarrhea, nausea, vomiting, or early satiety. Denies pain with intercourse, dysuria, frequency, hematuria or incontinence. Denies hot flashes, pelvic pain, vaginal bleeding or vaginal discharge.   Denies joint pain, back pain or muscle pain/cramps. Denies itching, rash, or wounds. Denies dizziness, headaches, numbness or seizures. Denies swollen  lymph nodes or glands, denies easy bruising or bleeding. Denies anxiety, depression, confusion, or decreased concentration.  Physical Exam: BP (!) 151/91 (BP Location: Left Arm, Patient Position: Sitting)   Pulse 64   Temp 98.3 F (36.8 C) (Oral)   Resp 16   Wt 116 lb 3.2 oz (52.7 kg)   SpO2 100%   BMI 23.47 kg/m  General: Alert, oriented, no acute distress.  GU: Well-healed area to the right lateral aspect of the mons.  1 x 2 cm area of mild leukoplakia and 2 areas on the inferior and superior aspect of this lesion with minimal desquamation.  This lesion borders the superior edge of a hemorrhoid.  No other vulvar areas concerning for dysplasia.  Several hypopigmented lesions consistent with prior laser therapy.  Laboratory & Radiologic Studies: None new  Assessment & Plan: Danielle Obrien is a 42 y.o. 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  Developed symptoms at the site of her wide local excision in the setting of margin positive for high-grade dysplasia.  Now status post repeat excision with negative margins.  Biopsies from other areas of CO2 laser ablation showed hyperkeratotic squamous tissue, no dysplasia or malignancy. Today, has new perianal lesion along the posterior perineum.   Suspect that the patient has vulvar/perianal dysplasia at the lesion noted today.  Offered to do a biopsy today but she would like to return with some premedication.  Will plan to have her try clobetasol cream until biopsy to see if this helps with her symptoms.  She will continue to use lidocaine as needed.  20 minutes of total time was spent for this patient encounter, including preparation, face-to-face counseling with the patient and coordination of care, and documentation of the encounter.  Jeral Pinch, MD  Division of Gynecologic Oncology  Department of Obstetrics and Gynecology  [facility]

## 2022-12-11 NOTE — Patient Instructions (Signed)
It was good to see you today.  We will see you back soon for a biopsy.  In the meantime, please keep using the lidocaine cream.  I am sending a mild steroid cream to your pharmacy for you to try once or twice a week in the evening.

## 2022-12-11 NOTE — Telephone Encounter (Signed)
Called patient to let her know that one time dose of '1mg'$  Ativan has been called in for use prior to biopsy. Also let patient know of rescheduled date and time for biopsy (01/07/23 at 3pm). Patient verbalized understanding and had no concerns at this time.

## 2022-12-13 ENCOUNTER — Other Ambulatory Visit: Payer: Self-pay | Admitting: Family Medicine

## 2022-12-17 ENCOUNTER — Ambulatory Visit: Payer: Managed Care, Other (non HMO) | Attending: Cardiology

## 2022-12-17 DIAGNOSIS — Z7901 Long term (current) use of anticoagulants: Secondary | ICD-10-CM

## 2022-12-17 DIAGNOSIS — Z954 Presence of other heart-valve replacement: Secondary | ICD-10-CM | POA: Diagnosis not present

## 2022-12-17 LAB — POCT INR: INR: 1.9 — AB (ref 2.0–3.0)

## 2022-12-17 NOTE — Patient Instructions (Signed)
Description   *PLEASE PUT ON APPT NO ONSITE INTERPRETER NEEDED*   Take 2 tablets today and then START taking 1 tablet daily except for 1.5 tablets on Sundays, Tuesdays, and Thursdays.  Stay consistent with greens each week (2-3 per week)  Recheck INR in 2 weeks  Coumadin Clinic [phone]

## 2022-12-18 ENCOUNTER — Ambulatory Visit: Payer: Managed Care, Other (non HMO) | Admitting: Gynecologic Oncology

## 2022-12-30 NOTE — Progress Notes (Unsigned)
[place] Phone: [phone] Subjective:   Fontaine No, am serving as a scribe for Dr. Hulan Saas.  I'm seeing this patient by the request  of:  Janith Lima, MD  CC: neck and left knee pain   HCW:[id number]  GRACEYN FODOR is a 42 y.o. female coming in with complaint of neck and L knee pain. Last seen December 2022.  Patient does have mild aortic stenosis due to a bicuspid valve and continues to follow-up with cardiology.  Patient continues to follow-up and continues to have INR check relatively frequently.  Reviewing patient's chart in March of last year did have another echocardiogram done showing that patient continues to have a mild dilated left atrium with mild mitral valve regurg but stable aortic valve replacement.  Patient states that she continues to have L knee pain with squatting and standing.   Pain in entire spine last week. She had to lay in bed all week. Using zanaflex, tramadol, and gabapentin: '2100mg'$  at once. Pain has improved. Has been walking to reduce pain.   Using Cymbalta but unsure if it is helping.   Medications we give her include the duloxetine as well as the tramadol.  Patient has been getting them refilled appropriately when checking the database.  Patient over the course of the year did have oxycodone prescribed by another provider on 3 different occasions secondary to procedures patient was having.  Otherwise tramadol has only been coming from myself.    Past Medical History:  Diagnosis Date   ADD (attention deficit disorder)    Alcohol abuse, in remission 2012   per pt in remission since 2012   Anticoagulated on Coumadin    coumadin--- managed by cardiology/ coumadin clinic   Arthritis    In hips   Asthma    allergy induced, rare inhaler use   Auditory processing disorder    per pt very HOH, first language ASL   Bipolar disorder ([place])    Chronic left hip pain     Chronic vertigo    Complication of anesthesia    per pt due to auditory processing disorder pt is unable to hear when waking up and also gets very anxious   GAD (generalized anxiety disorder)    GERD (gastroesophageal reflux disease)    History of cervical dysplasia    History of condyloma acuminatum 2017   s/p laser ablation vulva   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   biopsy and CO2 laser ablation   HOH (hard of hearing)    per pt very HOH due to auditroy processing disorder   Hyperlipidemia    Hypertension    Muscle spasms of both lower extremities    both hips   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   23 mm Sorin [employer] [place] bileaflet mechanical valve via right anterior mini thoracotomy   Vulvar dysplasia    Past Surgical History:  Procedure Laterality Date   ANTERIOR CRUCIATE LIGAMENT REPAIR  1993   AORTIC VALVE REPLACEMENT N/A 11/03/2017   Procedure: MINIMALLY INVASIVE AORTIC VALVE REPLACEMENT( MINI THORACOTOMY);  Surgeon: Rexene Alberts, MD;  Location: [facility];  Service: Open Heart Surgery;  Laterality: N/A;   CERVICAL BIOPSY  W/ LOOP ELECTRODE EXCISION  2010   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 ECC   COLPOSCOPY  06/30/2001   CIN I   COLPOSCOPY  08/30/2004   ECC--atypia   COLPOSCOPY N/A 01/21/2016   Procedure: COLPOSCOPY with vaginal biopsy with CO 2 Laser of Vaginal and vulvar condyloma;  Surgeon: [name], MD;  Location: [facility];  Service: Gynecology;  Laterality: N/A;  [name] will be here 2/21 for 1115 case confirmed 01/16/15 - TS   HARDWARE REMOVAL Left 1992   Left hip   INGUINAL  HERNIA REPAIR Bilateral    1981;  10982   LEFT AND RIGHT HEART CATHETERIZATION WITH CORONARY ANGIOGRAM N/A 05/18/2014   Procedure: LEFT AND RIGHT HEART CATHETERIZATION WITH CORONARY ANGIOGRAM;  Surgeon: Jettie Booze, MD;  Location: [facility] CATH LAB;  Service: Cardiovascular;  Laterality: N/A;   LESION REMOVAL Right 05/12/2022   Procedure: Maudie Mercury OF RIGHT CHEEK CYST;  Surgeon: Cindra Presume, MD;  Location: [facility];  Service: Plastics;  Laterality: Right;   LYMPH NODE BIOPSY  1995   NASAL SEPTUM SURGERY  2002   ORIF HIP FRACTURE Left 1990   per pt and  took out growth plate in Right knee   ROBOTIC ASSISTED TOTAL HYSTERECTOMY  02/26/2009   '@[facility]'$    TEE WITHOUT CARDIOVERSION N/A 11/03/2017   Procedure: TRANSESOPHAGEAL ECHOCARDIOGRAM (TEE);  Surgeon: Rexene Alberts, MD;  Location: [facility];  Service: Open Heart Surgery;  Laterality: N/A;   TONSILLECTOMY AND ADENOIDECTOMY  1990   TYMPANOSTOMY TUBE PLACEMENT Bilateral    x3  per pt last one [place] BIOPSY N/A 05/12/2022   Procedure: POSSIBLE VULVAR BIOPSY; POSSIBLE VAGINAL BIOPSY;  Surgeon: Lafonda Mosses, MD;  Location: [facility];  Service: Gynecology;  Laterality: N/A;   VULVECTOMY N/A 05/12/2022   Procedure: WIDE EXCISION VULVECTOMY;  Surgeon: Lafonda Mosses, MD;  Location: [facility];  Service: Gynecology;  Laterality: N/A;   VULVECTOMY N/A 08/12/2022   Procedure: WIDE LOCAL EXCISION VULVECTOMY;  Surgeon: Lafonda Mosses, MD;  Location: [facility];  Service: Gynecology;  Laterality: N/A;   Social History   Socioeconomic History   Marital status: Single    Spouse name: Not on file   Number of children: Not on file   Years of education: Not on file   Highest education level: Not on file  Occupational History   Not on file  Tobacco Use   Smoking status: Every Day    Packs/day: 0.25    Years: 20.00    Total pack years: 5.00    Types:  Cigarettes   Smokeless tobacco: Never  Vaping Use   Vaping Use: Never used  Substance and Sexual Activity   Alcohol use: No    Comment: in remission since 2012   Drug use: Yes    Types: Marijuana    Comment: 08-11-22 last, per pt smokes on average 1/4oz per week   Sexual activity: Yes    Partners: Male    Birth control/protection: Surgical    Comment: Hysterectomy  Other Topics Concern   Not on file  Social History Narrative   Not on file   Social Determinants of Health   Financial Resource Strain: Low Risk  (12/10/2018)   Overall Financial Resource Strain (CARDIA)  Difficulty of Paying Living Expenses: Not hard at all  Food Insecurity: Unknown (12/10/2018)   Hunger Vital Sign    Worried About Running Out of Food in the Last Year: Patient refused    [place] in the Last Year: Patient refused  Transportation Needs: Unknown (12/10/2018)   PRAPARE - Hydrologist (Medical): Patient refused    Lack of Transportation (Non-Medical): Patient refused  Physical Activity: Unknown (12/10/2018)   Exercise Vital Sign    Days of Exercise per Week: Patient refused    Minutes of Exercise per Session: Patient refused  Stress: Not on file  Social Connections: Unknown (12/10/2018)   Social Connection and Isolation Panel [NHANES]    Frequency of Communication with Friends and Family: Patient refused    Frequency of Social Gatherings with Friends and Family: Patient refused    Attends Religious Services: Patient refused    Marine scientist or Organizations: Patient refused    Attends Music therapist: Patient refused    Marital Status: Patient refused   Allergies  Allergen Reactions   Demerol Nausea And Vomiting   Ibuprofen Other (See Comments)    Gastritis    Family History  Problem Relation Age of Onset   Hyperlipidemia Mother    Hypertension Mother    Thyroid disease Mother    Hyperlipidemia Father    Hypertension Father     Migraines Father    Hypertension Brother    Hyperlipidemia Brother    Thyroid disease Maternal Grandmother    Thyroid disease Maternal Grandfather    Prostate cancer Maternal Grandfather    Colon cancer Paternal Great-grandmother      Current Outpatient Medications (Cardiovascular):    metoprolol tartrate (LOPRESSOR) 25 MG tablet, Take 0.5 tablets (12.5 mg total) by mouth daily.   rosuvastatin (CRESTOR) 20 MG tablet, Take 20 mg by mouth daily.  Current Outpatient Medications (Respiratory):    albuterol (VENTOLIN HFA) 108 (90 Base) MCG/ACT inhaler, Inhale 2 puffs into the lungs every 6 (six) hours as needed for wheezing or shortness of breath.  Current Outpatient Medications (Analgesics):    aspirin EC 81 MG tablet, Take 81 mg by mouth daily. Swallow whole.   traMADol (ULTRAM) 50 MG tablet, TAKE 2 TABLETS BY MOUTH TWICE A DAY  Current Outpatient Medications (Hematological):    warfarin (COUMADIN) 7.5 MG tablet, TAKE ONE TO ONE AND ONE-HALF (1-1.5) TABLETS BY MOUTH DAILY AS DIRECTED BY COUMADIN CLINIC  Current Outpatient Medications (Other):    clobetasol ointment (TEMOVATE) 9.52 %, Apply 1 Application topically 2 (two) times daily.   DULoxetine (CYMBALTA) 20 MG capsule, Take 2 capsules (40 mg total) by mouth daily.   fluconazole (DIFLUCAN) 150 MG tablet, Take 1 tablet (150 mg total) by mouth daily.   gabapentin (NEURONTIN) 300 MG capsule, TAKE TWO CAPSULES BY MOUTH THREE TIMES A DAY   lamoTRIgine (LAMICTAL) 200 MG tablet, Take 2 tablets (400 mg total) by mouth daily.   lansoprazole (PREVACID) 15 MG capsule, Take 15 mg by mouth daily as needed (for heartburn or acid reflux).   lidocaine (XYLOCAINE) 5 % ointment, Apply 1 Application topically as needed for moderate pain (to the vulva).   meclizine (ANTIVERT) 12.5 MG tablet, TAKE ONE TABLET BY MOUTH TWICE A DAY AS NEEDED FOR DIZZINESS (Patient taking differently: 2 (two) times daily as needed.)   SSD 1 % cream, APPLY 1 APPLICAITION  TOPICALLY DAILY   tiZANidine (ZANAFLEX) 4 MG tablet, Take 1 tablet (4  mg total) by mouth at bedtime.   Reviewed prior external information including notes and imaging from  primary care provider As well as notes that were available from care everywhere and other healthcare systems.  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  No pertanent information unless stated regarding to the chief complaint.   Review of Systems:  No headache, visual changes, nausea, vomiting, diarrhea, constipation, dizziness, abdominal pain, skin rash, fevers, chills, night sweats, weight loss, swollen lymph nodes, body aches, joint swelling, chest pain, shortness of breath, mood changes. POSITIVE muscle aches  Objective  Blood pressure 124/84, pulse 72, height '4\' 11"'$  (1.499 m), weight 119 lb (54 kg), SpO2 99 %.   General: No apparent distress alert and oriented x3 mood and affect normal, dressed appropriately.  HEENT: Pupils equal, extraocular movements intact  Respiratory: Patient's speak in full sentences and does not appear short of breath  Cardiovascular: No lower extremity edema, non tender, no erythema  Patient is sleeping, still has a leg length discrepancy.  Tender to palpation in the paraspinal musculature of the lumbar spine.  Negative straight leg test but does have some discomfort in multiple different ways.    Impression and Recommendations:     The above documentation has been reviewed and is accurate and complete Lyndal Pulley, DO

## 2022-12-31 ENCOUNTER — Ambulatory Visit (INDEPENDENT_AMBULATORY_CARE_PROVIDER_SITE_OTHER): Payer: Managed Care, Other (non HMO) | Admitting: Family Medicine

## 2022-12-31 ENCOUNTER — Ambulatory Visit: Payer: Managed Care, Other (non HMO) | Attending: Internal Medicine | Admitting: *Deleted

## 2022-12-31 VITALS — BP 124/84 | HR 72 | Ht 59.0 in | Wt 119.0 lb

## 2022-12-31 DIAGNOSIS — Z954 Presence of other heart-valve replacement: Secondary | ICD-10-CM

## 2022-12-31 DIAGNOSIS — Q231 Congenital insufficiency of aortic valve: Secondary | ICD-10-CM | POA: Diagnosis not present

## 2022-12-31 DIAGNOSIS — M2550 Pain in unspecified joint: Secondary | ICD-10-CM | POA: Diagnosis not present

## 2022-12-31 DIAGNOSIS — Q230 Congenital stenosis of aortic valve: Secondary | ICD-10-CM | POA: Diagnosis not present

## 2022-12-31 DIAGNOSIS — Z7901 Long term (current) use of anticoagulants: Secondary | ICD-10-CM | POA: Diagnosis not present

## 2022-12-31 DIAGNOSIS — F1190 Opioid use, unspecified, uncomplicated: Secondary | ICD-10-CM

## 2022-12-31 LAB — POCT INR: INR: 1.7 — AB (ref 2.0–3.0)

## 2022-12-31 MED ORDER — DULOXETINE HCL 20 MG PO CPEP: 40.0000 mg | ORAL_CAPSULE | Freq: Every day | ORAL | 0 refills | Status: DC

## 2022-12-31 NOTE — Patient Instructions (Signed)
UDS today Cymbalta '40mg'$   You have a lot of appts  Send message in a month Remember after surgery will talk about pelvic floor PT

## 2022-12-31 NOTE — Patient Instructions (Signed)
Description   *PLEASE PUT ON APPT NO ONSITE INTERPRETER NEEDED*   Take 2 tablets today and then START taking 1.5 tablets daily except for 1 tablet on Monday, Wednesdays, and Fridays.  Stay consistent with greens each week (2-3 per week)  Recheck INR in 3 weeks  Coumadin Clinic [phone]

## 2022-12-31 NOTE — Assessment & Plan Note (Signed)
Chronic continue use of the tramadol.  100 mg twice daily is helping somewhat.  Will increase the Cymbalta to 40 mg.  Patient is unable to take any anti-inflammatories secondary to patient having a valve replacement.  Being worked up now as well for the vaginal dysplasia and has already had a hysterectomy.  Concern for the possibility of needing more advanced surgery.  We did discuss with her as with the interpreter that patient would be a good candidate for pelvic floor physical therapy if needed later date.  Urine drug screen ordered today.  We did check the [place] for appropriate use follow-up with me again at a later date but will Keep patient from seeing to any providers.

## 2023-01-02 LAB — DRUG MONITORING, PANEL 8 WITH CONFIRMATION, URINE
6 Acetylmorphine: NEGATIVE ng/mL (ref <10)
Alcohol Metabolites: NEGATIVE ng/mL (ref <500)
Amphetamines: NEGATIVE ng/mL (ref <500)
Benzodiazepines: NEGATIVE ng/mL (ref <100)
Buprenorphine, Urine: NEGATIVE ng/mL (ref <5)
Cocaine Metabolite: NEGATIVE ng/mL (ref <150)
Creatinine: 48.5 mg/dL (ref >=20.0)
MDMA: NEGATIVE ng/mL (ref <500)
Marijuana Metabolite: 490 ng/mL — ABNORMAL HIGH (ref <5)
Marijuana Metabolite: POSITIVE ng/mL — AB (ref <20)
Opiates: NEGATIVE ng/mL (ref <100)
Oxidant: NEGATIVE ug/mL (ref <200)
Oxycodone: NEGATIVE ng/mL (ref <100)
pH: 7.4 (ref 4.5–9.0)

## 2023-01-02 LAB — DM TEMPLATE

## 2023-01-06 ENCOUNTER — Encounter: Payer: Self-pay | Admitting: Internal Medicine

## 2023-01-07 ENCOUNTER — Encounter: Payer: Self-pay | Admitting: Gynecologic Oncology

## 2023-01-07 ENCOUNTER — Inpatient Hospital Stay: Payer: Managed Care, Other (non HMO) | Attending: Gynecologic Oncology | Admitting: Gynecologic Oncology

## 2023-01-07 ENCOUNTER — Other Ambulatory Visit: Payer: Self-pay

## 2023-01-07 VITALS — BP 144/79 | HR 78 | Temp 98.0°F | Resp 14 | Wt 115.6 lb

## 2023-01-07 DIAGNOSIS — N9089 Other specified noninflammatory disorders of vulva and perineum: Secondary | ICD-10-CM | POA: Insufficient documentation

## 2023-01-07 DIAGNOSIS — Z9079 Acquired absence of other genital organ(s): Secondary | ICD-10-CM | POA: Diagnosis not present

## 2023-01-07 DIAGNOSIS — D071 Carcinoma in situ of vulva: Secondary | ICD-10-CM

## 2023-01-07 DIAGNOSIS — L292 Pruritus vulvae: Secondary | ICD-10-CM | POA: Diagnosis not present

## 2023-01-07 NOTE — Patient Instructions (Signed)
We will contact you with the results of your biopsy from today. When toileting, rinse the area and pat dry.

## 2023-01-07 NOTE — Progress Notes (Signed)
Gynecologic Oncology Return Clinic Visit  01/07/23  Reason for Visit: follow-up  Treatment History: Dysplasia history:  03/19/22: Vaginal cuff biopsy - VAIN1; left buttocks biopsy - VIN3, Perineal biopsy - VIN 2-3, mons biopsy - VIN 2-3. 01/2022 Vaginal pap - negative, HR HPV positive 12/2018: Pap - negative, HR HPV positive (negative for 16, 18 and 45) 05/2017: vaginal biopsies - one with benign squamous mucosa, other with VAIN I 04/2017: Vaginal pap - negative, HR HPV not detected. 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 Vaginal biopsies, one with VAIN 2, other VAIN 1. 10/2015: vaginal cuff biopsy: VAIN II.  Vulvar biopsies performed including mons and right labia, both showing condyloma. 08/2015: Vaginal pap - LSIL, HR HPV positive. 02/2014: Vaginal cuff biopsy showed condylomata acuminata, p16 negative for diffuse positive staining.  No malignancy or high-grade dysplasia.  Right labial biopsy shows condyloma. 01/2014: Vaginal pap - LSIL.  Reactive squamous cells present. HR HPV positive. 01/2009: Pathology from hysterectomy shows chronic mild inflammation of the cervix and endocervix, mesonephric hyperplasia.  No evidence of malignancy.  Secretory type endometrium. 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  Endo and ectocervical margins negative for mucosa but partially denuded.  Stromal invasion noted. 10/2008: Cervical biopsy at 6:00 shows HPV effect consistent with low-grade dysplasia.  ECC with 1 fragment of ectocervical/squamous metaplasia with low-grade intraepithelial lesion and focal changes consistent with CIN-2. 07/2020: ECC shows benign endocervix, no dysplasia. 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  Cervical biopsy at 1:00 showed squamous mucosa with koilocytic atypia, no dysplasia identified.  ECC shows rare detached fragments of slightly  dysplastic squamous mucosa with benign endocervical mucosa.   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   07/23/22: Vulvar biopsy given symptoms at prior healing incision shows VIN3.    08/12/22: Partial simple anterior left vulvectomy.  Interval History: Doing well.  Continues to have symptoms related to the perianal lesion.  Continues to have intermittent pruritus.  Did not notice any change in symptoms with clobetasol use.  Brother recently gave her a bidet so that she does not have to wipe this area.  Past Medical/Surgical History: Past Medical History:  Diagnosis Date   ADD (attention deficit disorder)    Alcohol abuse, in remission 2012   per pt in remission since 2012   Anticoagulated on Coumadin    coumadin--- managed by cardiology/ coumadin clinic   Arthritis    In hips   Asthma    allergy induced, rare inhaler use   Auditory processing disorder    per pt very HOH, first language ASL   Bipolar disorder ([place])    Chronic left hip pain    Chronic vertigo    Complication of anesthesia    per pt due to auditory processing disorder pt is unable to hear when waking up and also gets very anxious   GAD (generalized anxiety disorder)    GERD (gastroesophageal reflux disease)    History of cervical dysplasia    History of condyloma acuminatum 2017   s/p laser ablation vulva   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   biopsy and CO2 laser ablation   HOH (hard of hearing)    per pt very HOH due to auditroy processing disorder   Hyperlipidemia    Hypertension    Muscle spasms of both lower extremities  both hips   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   23 mm Sorin [employer] [place] bileaflet mechanical valve via right anterior mini thoracotomy   Vulvar dysplasia     Past Surgical History:  Procedure Laterality Date   ANTERIOR CRUCIATE LIGAMENT REPAIR  1993   AORTIC  VALVE REPLACEMENT N/A 11/03/2017   Procedure: MINIMALLY INVASIVE AORTIC VALVE REPLACEMENT( MINI THORACOTOMY);  Surgeon: Rexene Alberts, MD;  Location: [place];  Service: Open Heart Surgery;  Laterality: N/A;   CERVICAL BIOPSY  W/ LOOP ELECTRODE EXCISION  2010   CIN III w/extension to glands   CLOSED REDUCTION HIP DISLOCATION Left 2831   CO2 LASER APPLICATION N/A 04/16/6159   Procedure: CO2 LASER APPLICATION TO VULVA;  Surgeon: Lafonda Mosses, MD;  Location: [facility];  Service: Gynecology;  Laterality: N/A;   COLPOSCOPY  10/30/2008   CIN I & II   COLPOSCOPY  07/31/2000   Neg. ECC   COLPOSCOPY  06/30/2001   CIN I   COLPOSCOPY  08/30/2004   ECC--atypia   COLPOSCOPY N/A 01/21/2016   Procedure: COLPOSCOPY with vaginal biopsy with CO 2 Laser of Vaginal and vulvar condyloma;  Surgeon: [name], MD;  Location: [facility];  Service: Gynecology;  Laterality: N/A;  [name] will be here 2/21 for 1115 case confirmed 01/16/15 - TS   HARDWARE REMOVAL Left 1992   Left hip   INGUINAL HERNIA REPAIR Bilateral    1981;  10982   LEFT AND RIGHT HEART CATHETERIZATION WITH CORONARY ANGIOGRAM N/A 05/18/2014   Procedure: LEFT AND RIGHT HEART CATHETERIZATION WITH CORONARY ANGIOGRAM;  Surgeon: Jettie Booze, MD;  Location: [facility] C[facility];  Service: Cardiovascular;  Laterality: N/A;   LESION REMOVAL Right 05/12/2022   Procedure: Maudie Mercury OF RIGHT CHEEK CYST;  Surgeon: Cindra Presume, MD;  Location: [place];  Service: Plastics;  Laterality: Right;   LYMPH NODE BIOPSY  1995   NASAL SEPTUM SURGERY  2002   ORIF HIP FRACTURE Left 1990   per pt and  took out growth plate in Right knee   ROBOTIC ASSISTED TOTAL HYSTERECTOMY  02/26/2009   '@[facility]'$    TEE WITHOUT CARDIOVERSION N/A 11/03/2017   Procedure: TRANSESOPHAGEAL ECHOCARDIOGRAM (TEE);  Surgeon: Rexene Alberts, MD;  Location: [place];  Service: Open Heart Surgery;  Laterality: N/A;   TONSILLECTOMY AND ADENOIDECTOMY  1990    TYMPANOSTOMY TUBE PLACEMENT Bilateral    x3  per pt last one [place] BIOPSY N/A 05/12/2022   Procedure: POSSIBLE VULVAR BIOPSY; POSSIBLE VAGINAL BIOPSY;  Surgeon: Lafonda Mosses, MD;  Location: [facility];  Service: Gynecology;  Laterality: N/A;   VULVECTOMY N/A 05/12/2022   Procedure: WIDE EXCISION VULVECTOMY;  Surgeon: Lafonda Mosses, MD;  Location: [facility];  Service: Gynecology;  Laterality: N/A;   VULVECTOMY N/A 08/12/2022   Procedure: WIDE LOCAL EXCISION VULVECTOMY;  Surgeon: Lafonda Mosses, MD;  Location: [facility];  Service: Gynecology;  Laterality: N/A;    Family History  Problem Relation Age of Onset   Hyperlipidemia Mother    Hypertension Mother    Thyroid disease Mother    Hyperlipidemia Father    Hypertension Father    Migraines Father    Hypertension Brother    Hyperlipidemia Brother    Thyroid disease Maternal Grandmother    Thyroid disease Maternal Grandfather    Prostate cancer Maternal Grandfather  Colon cancer Paternal Great-grandmother     Social History   Socioeconomic History   Marital status: Single    Spouse name: Not on file   Number of children: Not on file   Years of education: Not on file   Highest education level: Not on file  Occupational History   Not on file  Tobacco Use   Smoking status: Every Day    Packs/day: 0.25    Years: 20.00    Total pack years: 5.00    Types: Cigarettes   Smokeless tobacco: Never  Vaping Use   Vaping Use: Never used  Substance and Sexual Activity   Alcohol use: No    Comment: in remission since 2012   Drug use: Yes    Types: Marijuana    Comment: 08-11-22 last, per pt smokes on average 1/4oz per week   Sexual activity: Yes    Partners: Male    Birth control/protection: Surgical    Comment: Hysterectomy  Other Topics Concern   Not on file  Social History Narrative   Not on file   Social Determinants of Health    Financial Resource Strain: Low Risk  (12/10/2018)   Overall Financial Resource Strain (CARDIA)    Difficulty of Paying Living Expenses: Not hard at all  Food Insecurity: Unknown (12/10/2018)   Hunger Vital Sign    Worried About [place] in the Last Year: Patient refused    [place] in the Last Year: Patient refused  Transportation Needs: Unknown (12/10/2018)   [place] - Transportation    Lack of Transportation (Medical): Patient refused    Lack of Transportation (Non-Medical): Patient refused  Physical Activity: Unknown (12/10/2018)   Exercise Vital Sign    Days of Exercise per Week: Patient refused    Minutes of Exercise per Session: Patient refused  Stress: Not on file  Social Connections: Unknown (12/10/2018)   Social Connection and Isolation Panel [NHANES]    Frequency of Communication with Friends and Family: Patient refused    Frequency of Social Gatherings with Friends and Family: Patient refused    Attends Religious Services: Patient refused    Marine scientist or Organizations: Patient refused    Attends Archivist Meetings: Patient refused    Marital Status: Patient refused    Current Medications:  Current Outpatient Medications:    albuterol (VENTOLIN HFA) 108 (90 Base) MCG/ACT inhaler, Inhale 2 puffs into the lungs every 6 (six) hours as needed for wheezing or shortness of breath., Disp: 1 each, Rfl: 3   aspirin EC 81 MG tablet, Take 81 mg by mouth daily. Swallow whole., Disp: , Rfl:    clobetasol ointment (TEMOVATE) 7.35 %, Apply 1 Application topically 2 (two) times daily., Disp: 30 g, Rfl: 0   DULoxetine (CYMBALTA) 20 MG capsule, Take 2 capsules (40 mg total) by mouth daily., Disp: 60 capsule, Rfl: 0   fluconazole (DIFLUCAN) 150 MG tablet, Take 1 tablet (150 mg total) by mouth daily., Disp: 1 tablet, Rfl: 0   gabapentin (NEURONTIN) 300 MG capsule, TAKE TWO CAPSULES BY MOUTH THREE TIMES A DAY, Disp: 540 capsule, Rfl: 0   lamoTRIgine  (LAMICTAL) 200 MG tablet, Take 2 tablets (400 mg total) by mouth daily., Disp: 180 tablet, Rfl: 1   lansoprazole (PREVACID) 15 MG capsule, Take 15 mg by mouth daily as needed (for heartburn or acid reflux)., Disp: , Rfl:    lidocaine (XYLOCAINE) 5 % ointment, Apply 1 Application topically as needed for  moderate pain (to the vulva)., Disp: 35.44 g, Rfl: 1   meclizine (ANTIVERT) 12.5 MG tablet, TAKE ONE TABLET BY MOUTH TWICE A DAY AS NEEDED FOR DIZZINESS (Patient taking differently: 2 (two) times daily as needed.), Disp: 20 tablet, Rfl: 0   metoprolol tartrate (LOPRESSOR) 25 MG tablet, Take 0.5 tablets (12.5 mg total) by mouth daily., Disp: 45 tablet, Rfl: 3   rosuvastatin (CRESTOR) 20 MG tablet, Take 20 mg by mouth daily., Disp: , Rfl:    SSD 1 % cream, APPLY 1 APPLICAITION TOPICALLY DAILY, Disp: 50 g, Rfl: 0   tiZANidine (ZANAFLEX) 4 MG tablet, Take 1 tablet (4 mg total) by mouth at bedtime., Disp: 30 tablet, Rfl: 0   traMADol (ULTRAM) 50 MG tablet, TAKE 2 TABLETS BY MOUTH TWICE A DAY, Disp: 120 tablet, Rfl: 0   warfarin (COUMADIN) 7.5 MG tablet, TAKE ONE TO ONE AND ONE-HALF (1-1.5) TABLETS BY MOUTH DAILY AS DIRECTED BY COUMADIN CLINIC, Disp: 40 tablet, Rfl: 0  Review of Systems: + anxiety, depression Denies appetite changes, fevers, chills, fatigue, unexplained weight changes. Denies hearing loss, neck lumps or masses, mouth sores, ringing in ears or voice changes. Denies cough or wheezing.  Denies shortness of breath. Denies chest pain or palpitations. Denies leg swelling. Denies abdominal distention, pain, blood in stools, constipation, diarrhea, nausea, vomiting, or early satiety. Denies pain with intercourse, dysuria, frequency, hematuria or incontinence. Denies hot flashes, pelvic pain, vaginal bleeding or vaginal discharge.   Denies joint pain, back pain or muscle pain/cramps. Denies rash, or wounds. Denies dizziness, headaches, numbness or seizures. Denies swollen lymph nodes or  glands, denies easy bruising or bleeding. Denies confusion, or decreased concentration.  Physical Exam: BP (!) 144/79 (BP Location: Left Arm, Patient Position: Sitting)   Pulse 78   Temp 98 F (36.7 C) (Oral)   Resp 14   Wt 115 lb 9.6 oz (52.4 kg)   SpO2 100%   BMI 23.35 kg/m  General: Alert, oriented, no acute distress.   GU: Well-healed area to the right lateral aspect of the mons.  1 x 2 cm area of mild leukoplakia and 1 area on the superior aspect of this lesion with minimal desquamation.  This lesion borders the superior edge of a hemorrhoid.  No other vulvar areas concerning for dysplasia.  Several hypopigmented lesions consistent with prior laser therapy.  Vulvar/peri-anal biopsy procedure Preoperative diagnosis: history of VIN3, vulvar symptoms Postoperative diagnosis: Same as above Physician: Berline Lopes MD Estimated blood loss: Minimal Specimens: Pari-anal/perineal biopsy Procedure: After the procedure was discussed with the patient including risks and benefits, she gave verbal consent.  She was then placed in dorsolithotomy position the vulvar area to be biopsy was examined with findings as above.  The area was cleansed with Betadine x 3 with plan for biopsy of the desquamative area at the superior aspect of the leukoplakia.  2 cc of 1% lidocaine was then injected.  After adequate time for anesthesia was given, a 3 mm punch biopsy was taken.  This was placed in formalin.  Silver nitrate and pressure were used to achieve hemostasis.  Overall the patient tolerated the procedure well.  Laboratory & Radiologic Studies: None new  Assessment & Plan: Danielle Obrien is a 42 y.o. 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  Developed symptoms at the site of her wide local excision in the setting of margin positive for high-grade dysplasia.  Now status post repeat excision with negative margins.  Biopsies from other areas of  CO2 laser ablation showed hyperkeratotic squamous  tissue, no dysplasia or malignancy. Recently developed new perianal lesion along the posterior perineum.  No improvement with clobetasol.  Presents today for biopsy.   Biopsy performed today with premedication.  Patient tolerated biopsy well.  Suspect that this area may be new dysplasia.  I will call her with biopsy results.  If high-grade dysplasia, discussed would likely benefit from either excision or laser in the OR given proximity to hemorrhoid.  20 minutes of total time was spent for this patient encounter, including preparation, face-to-face counseling with the patient and coordination of care, and documentation of the encounter.  Jeral Pinch, MD  Division of Gynecologic Oncology  Department of Obstetrics and Gynecology  [facility]

## 2023-01-08 ENCOUNTER — Other Ambulatory Visit: Payer: Self-pay | Admitting: Cardiology

## 2023-01-09 ENCOUNTER — Other Ambulatory Visit: Payer: Self-pay | Admitting: Internal Medicine

## 2023-01-10 ENCOUNTER — Other Ambulatory Visit: Payer: Self-pay | Admitting: Family Medicine

## 2023-01-10 ENCOUNTER — Other Ambulatory Visit: Payer: Self-pay | Admitting: Cardiology

## 2023-01-10 DIAGNOSIS — Z7901 Long term (current) use of anticoagulants: Secondary | ICD-10-CM

## 2023-01-10 DIAGNOSIS — Z954 Presence of other heart-valve replacement: Secondary | ICD-10-CM

## 2023-01-10 DIAGNOSIS — Q231 Congenital insufficiency of aortic valve: Secondary | ICD-10-CM

## 2023-01-11 ENCOUNTER — Other Ambulatory Visit: Payer: Self-pay | Admitting: Family Medicine

## 2023-01-11 LAB — SURGICAL PATHOLOGY

## 2023-01-11 MED ORDER — GABAPENTIN 300 MG PO CAPS: 600.0000 mg | ORAL_CAPSULE | Freq: Three times a day (TID) | ORAL | 1 refills | Status: DC

## 2023-01-11 MED ORDER — TRAMADOL HCL 50 MG PO TABS: 100.0000 mg | ORAL_TABLET | Freq: Two times a day (BID) | ORAL | 0 refills | Status: DC

## 2023-01-12 ENCOUNTER — Encounter: Payer: Self-pay | Admitting: Gynecologic Oncology

## 2023-01-12 ENCOUNTER — Telehealth: Payer: Self-pay

## 2023-01-12 NOTE — Telephone Encounter (Signed)
Follow up appointment made with Dr. Berline Lopes for 2 months. Scheduled on 4/11

## 2023-01-12 NOTE — Progress Notes (Signed)
Spoke with the patient about recent biopsy results. She is happy to hear no dysplasia or malignancy scene. Plan to continue clobetasol, follow up in clinic in two months. I've asked her to call if symptoms worsen during that time.  Valarie Cones MD

## 2023-01-12 NOTE — Telephone Encounter (Signed)
Ms. Novosel calling for biopsy results.  She is aware Dr.Tucker is out of the office but I will notify her of call.  Dr. Berline Lopes and [name] NP , notified

## 2023-01-14 ENCOUNTER — Ambulatory Visit: Payer: Managed Care, Other (non HMO) | Admitting: Internal Medicine

## 2023-01-21 ENCOUNTER — Ambulatory Visit: Payer: Managed Care, Other (non HMO) | Attending: Cardiology | Admitting: *Deleted

## 2023-01-21 DIAGNOSIS — Q231 Congenital insufficiency of aortic valve: Secondary | ICD-10-CM

## 2023-01-21 DIAGNOSIS — Q230 Congenital stenosis of aortic valve: Secondary | ICD-10-CM

## 2023-01-21 DIAGNOSIS — Z7901 Long term (current) use of anticoagulants: Secondary | ICD-10-CM

## 2023-01-21 DIAGNOSIS — Z954 Presence of other heart-valve replacement: Secondary | ICD-10-CM

## 2023-01-21 LAB — POCT INR: INR: 2.5 (ref 2.0–3.0)

## 2023-01-21 NOTE — Patient Instructions (Signed)
Description   *PLEASE PUT ON APPT NO ONSITE INTERPRETER NEEDED*   Continue taking warfarin 1.5 tablets daily except for 1 tablet on Monday, Wednesdays, and Fridays. Stay consistent with greens each week (2-3 per week)  Recheck INR in 4 weeks  Coumadin Clinic [phone]

## 2023-01-31 ENCOUNTER — Other Ambulatory Visit: Payer: Self-pay | Admitting: Family Medicine

## 2023-02-06 ENCOUNTER — Other Ambulatory Visit: Payer: Self-pay | Admitting: Internal Medicine

## 2023-02-06 DIAGNOSIS — F3177 Bipolar disorder, in partial remission, most recent episode mixed: Secondary | ICD-10-CM

## 2023-02-06 DIAGNOSIS — F411 Generalized anxiety disorder: Secondary | ICD-10-CM

## 2023-02-11 ENCOUNTER — Encounter: Payer: Self-pay | Admitting: Internal Medicine

## 2023-02-11 ENCOUNTER — Ambulatory Visit: Payer: Managed Care, Other (non HMO) | Admitting: Internal Medicine

## 2023-02-11 ENCOUNTER — Ambulatory Visit: Payer: Managed Care, Other (non HMO)

## 2023-02-11 VITALS — BP 148/80 | HR 82 | Temp 98.5°F | Resp 16 | Ht 59.0 in | Wt 114.0 lb

## 2023-02-11 DIAGNOSIS — Z7901 Long term (current) use of anticoagulants: Secondary | ICD-10-CM

## 2023-02-11 DIAGNOSIS — J22 Unspecified acute lower respiratory infection: Secondary | ICD-10-CM

## 2023-02-11 DIAGNOSIS — E785 Hyperlipidemia, unspecified: Secondary | ICD-10-CM | POA: Diagnosis not present

## 2023-02-11 DIAGNOSIS — F3177 Bipolar disorder, in partial remission, most recent episode mixed: Secondary | ICD-10-CM

## 2023-02-11 DIAGNOSIS — R052 Subacute cough: Secondary | ICD-10-CM | POA: Insufficient documentation

## 2023-02-11 DIAGNOSIS — Q230 Congenital stenosis of aortic valve: Secondary | ICD-10-CM

## 2023-02-11 DIAGNOSIS — I10 Essential (primary) hypertension: Secondary | ICD-10-CM | POA: Diagnosis not present

## 2023-02-11 DIAGNOSIS — Z720 Tobacco use: Secondary | ICD-10-CM

## 2023-02-11 DIAGNOSIS — D696 Thrombocytopenia, unspecified: Secondary | ICD-10-CM

## 2023-02-11 DIAGNOSIS — Z954 Presence of other heart-valve replacement: Secondary | ICD-10-CM

## 2023-02-11 LAB — BASIC METABOLIC PANEL
BUN: 11 mg/dL (ref 6–23)
CO2: 31 mEq/L (ref 19–32)
Calcium: 9.8 mg/dL (ref 8.4–10.5)
Chloride: 101 mEq/L (ref 96–112)
Creatinine, Ser: 0.93 mg/dL (ref 0.40–1.20)
GFR: 75.68 mL/min (ref >60.00)
Glucose, Bld: 104 mg/dL — ABNORMAL HIGH (ref 70–99)
Potassium: 4.5 mEq/L (ref 3.5–5.1)
Sodium: 138 mEq/L (ref 135–145)

## 2023-02-11 LAB — CBC WITH DIFFERENTIAL/PLATELET
Basophils Absolute: 0.1 10*3/uL (ref 0.0–0.1)
Basophils Relative: 1.3 % (ref 0.0–3.0)
Eosinophils Absolute: 0.1 10*3/uL (ref 0.0–0.7)
Eosinophils Relative: 2 % (ref 0.0–5.0)
HCT: 46 % (ref 36.0–46.0)
Hemoglobin: 15.8 g/dL — ABNORMAL HIGH (ref 12.0–15.0)
Lymphocytes Relative: 44.5 % (ref 12.0–46.0)
Lymphs Abs: 2.8 10*3/uL (ref 0.7–4.0)
MCHC: 34.3 g/dL (ref 30.0–36.0)
MCV: 91.4 fl (ref 78.0–100.0)
Monocytes Absolute: 0.4 10*3/uL (ref 0.1–1.0)
Monocytes Relative: 6.8 % (ref 3.0–12.0)
Neutro Abs: 2.9 10*3/uL (ref 1.4–7.7)
Neutrophils Relative %: 45.4 % (ref 43.0–77.0)
Platelets: 120 10*3/uL — ABNORMAL LOW (ref 150.0–400.0)
RBC: 5.04 Mil/uL (ref 3.87–5.11)
RDW: 13.4 % (ref 11.5–15.5)
WBC: 6.3 10*3/uL (ref 4.0–10.5)

## 2023-02-11 LAB — HEPATIC FUNCTION PANEL
ALT: 18 U/L (ref 0–35)
AST: 27 U/L (ref 0–37)
Albumin: 4.5 g/dL (ref 3.5–5.2)
Alkaline Phosphatase: 70 U/L (ref 39–117)
Bilirubin, Direct: 0.1 mg/dL (ref 0.0–0.3)
Total Bilirubin: 0.5 mg/dL (ref 0.2–1.2)
Total Protein: 7.4 g/dL (ref 6.0–8.3)

## 2023-02-11 LAB — LIPID PANEL
Cholesterol: 195 mg/dL (ref 0–200)
HDL: 77.9 mg/dL (ref >39.00)
LDL Cholesterol: 104 mg/dL — ABNORMAL HIGH (ref 0–99)
NonHDL: 117.46
Total CHOL/HDL Ratio: 3
Triglycerides: 67 mg/dL (ref 0.0–149.0)
VLDL: 13.4 mg/dL (ref 0.0–40.0)

## 2023-02-11 LAB — FOLATE: Folate: 8.2 ng/mL (ref >5.9)

## 2023-02-11 LAB — PROTIME-INR
INR: 3.7 ratio — ABNORMAL HIGH (ref 0.8–1.0)
Prothrombin Time: 37.8 s — ABNORMAL HIGH (ref 9.6–13.1)

## 2023-02-11 LAB — VITAMIN B12: Vitamin B-12: 305 pg/mL (ref 211–911)

## 2023-02-11 MED ORDER — LAMOTRIGINE 200 MG PO TABS: 400.0000 mg | ORAL_TABLET | Freq: Every day | ORAL | 0 refills | Status: DC

## 2023-02-11 MED ORDER — AMOXICILLIN-POT CLAVULANATE 875-125 MG PO TABS: 1.0000 | ORAL_TABLET | Freq: Two times a day (BID) | ORAL | 0 refills | Status: AC

## 2023-02-11 NOTE — Patient Instructions (Signed)
Cough, Adult Coughing is a reflex that clears your throat and airways (respiratory system). It helps heal and protect your lungs. It is normal to cough from time to time. A cough that happens with other symptoms or that lasts a long time may be a sign of a condition that needs treatment. A short-term (acute) cough may only last 2-3 weeks. A long-term (chronic) cough may last 8 or more weeks. Coughing is often caused by: Diseases, such as: An infection of the respiratory system. Asthma or other heart or lung diseases. Gastroesophageal reflux. This is when acid comes back up from the stomach. Breathing in things that irritate your lungs. Allergies. Postnasal drip. This is when mucus runs down the back of your throat. Smoking. Some medicines. Follow these instructions at home: Medicines Take over-the-counter and prescription medicines only as told by your health care provider. Talk with your provider before you take cough medicine (cough suppressants). Eating and drinking Do not drink alcohol. Avoid caffeine. Drink enough fluid to keep your pee (urine) pale yellow. Lifestyle Avoid cigarette smoke. Do not use any products that contain nicotine or tobacco. These products include cigarettes, chewing tobacco, and vaping devices, such as e-cigarettes. If you need help quitting, ask your provider. Avoid things that make you cough. These may include perfumes, candles, cleaning products, or campfire smoke. General instructions  Watch for any changes to your cough. Tell your provider about them. Always cover your mouth when you cough. If the air is dry in your bedroom or home, use a cool mist vaporizer or humidifier. If your cough is worse at night, try to sleep in a semi-upright position. Rest as needed. Contact a health care provider if: You have new symptoms, or your symptoms get worse. You cough up pus. You have a fever that does not go away or a cough that does not get better after 2-3  weeks. You cannot control your cough with medicine, and you are losing sleep. You have pain that gets worse or is not helped with medicine. You lose weight for no clear reason. You have night sweats. Get help right away if: You cough up blood. You have trouble breathing. Your heart is beating very fast. These symptoms may be an emergency. Get help right away. Call 911. Do not wait to see if the symptoms will go away. Do not drive yourself to the hospital. This information is not intended to replace advice given to you by your health care provider. Make sure you discuss any questions you have with your health care provider. Document Revised: 07/17/2022 Document Reviewed: 07/17/2022 Elsevier Patient Education  2023 [employer]  

## 2023-02-11 NOTE — Progress Notes (Signed)
Subjective:  Patient ID: Danielle Obrien, female    DOB: May 08, 1981  Age: 42 y.o. MRN: [id number]  CC: Hypertension, Hyperlipidemia, and Cough   HPI Danielle Obrien presents for f/up ---  She complains about 3-week history of cough productive of thick yellow/green phlegm with night sweats.  She denies chest pain, hemoptysis, fever, chills, wheezing, or shortness of breath.  Outpatient Medications Prior to Visit  Medication Sig Dispense Refill   albuterol (VENTOLIN HFA) 108 (90 Base) MCG/ACT inhaler Inhale 2 puffs into the lungs every 6 (six) hours as needed for wheezing or shortness of breath. 1 each 3   aspirin EC 81 MG tablet Take 81 mg by mouth daily. Swallow whole.     clobetasol ointment (TEMOVATE) [id number] % Apply 1 Application topically 2 (two) times daily. 30 g 0   DULoxetine (CYMBALTA) 20 MG capsule TAKE 2 CAPSULES BY MOUTH DAILY 60 capsule 0   fluconazole (DIFLUCAN) 150 MG tablet Take 1 tablet (150 mg total) by mouth daily. 1 tablet 0   gabapentin (NEURONTIN) 300 MG capsule Take 2 capsules (600 mg total) by mouth 3 (three) times daily. 540 capsule 1   lansoprazole (PREVACID) 15 MG capsule Take 15 mg by mouth daily as needed (for heartburn or acid reflux).     lidocaine (XYLOCAINE) 5 % ointment Apply 1 Application topically as needed for moderate pain (to the vulva). 35.44 g 1   meclizine (ANTIVERT) 12.5 MG tablet TAKE ONE TABLET BY MOUTH TWICE A DAY AS NEEDED FOR DIZZINESS (Patient taking differently: 2 (two) times daily as needed.) 20 tablet 0   metoprolol tartrate (LOPRESSOR) 25 MG tablet TAKE 1/2 TABLET BY MOUTH DAILY 45 tablet 1   rosuvastatin (CRESTOR) 20 MG tablet TAKE 1 TABLET BY MOUTH DAILY 90 tablet 0   SSD 1 % cream APPLY 1 APPLICAITION TOPICALLY DAILY 50 g 0   tiZANidine (ZANAFLEX) 4 MG tablet Take 1 tablet (4 mg total) by mouth at bedtime. 30 tablet 0   traMADol (ULTRAM) 50 MG tablet Take 2 tablets (100 mg total) by mouth 2 (two) times daily. 120 tablet 0   warfarin  (COUMADIN) 7.5 MG tablet TAKE 1 TO 1 AND 1/2 TABLET BY MOUTH DAILY AS DIRECTED BY COUMADIN CLINIC 40 tablet 2   lamoTRIgine (LAMICTAL) 200 MG tablet Take 2 tablets (400 mg total) by mouth daily. 180 tablet 1   No facility-administered medications prior to visit.    ROS Review of Systems  Constitutional:  Negative for chills, diaphoresis, fatigue and fever.  HENT: Negative.  Negative for sore throat.   Eyes: Negative.   Respiratory:  Positive for cough. Negative for chest tightness, shortness of breath and wheezing.   Cardiovascular:  Negative for chest pain, palpitations and leg swelling.  Gastrointestinal: Negative.  Negative for abdominal pain, diarrhea, nausea and vomiting.  Endocrine: Negative.   Genitourinary:  Negative for difficulty urinating.  Musculoskeletal: Negative.  Negative for myalgias.  Skin: Negative.   Neurological: Negative.  Negative for dizziness, weakness and numbness.  Hematological:  Negative for adenopathy. Bruises/bleeds easily.  Psychiatric/Behavioral: Negative.      Objective:  BP (!) 148/80 (BP Location: Right Arm, Patient Position: Sitting, Cuff Size: Normal)   Pulse 82   Temp 98.5 F (36.9 C) (Oral)   Resp 16   Ht 4\' 11"  (1.499 m)   Wt 114 lb (51.7 kg)   SpO2 97%   BMI 23.03 kg/m   BP Readings from Last 3 Encounters:  02/11/23 (!) 148/80  01/07/23 (!) 144/79  12/31/22 124/84    Wt Readings from Last 3 Encounters:  02/11/23 114 lb (51.7 kg)  01/07/23 115 lb 9.6 oz (52.4 kg)  12/31/22 119 lb (54 kg)    Physical Exam Vitals reviewed.  Constitutional:      General: She is not in acute distress.    Appearance: She is not ill-appearing, toxic-appearing or diaphoretic.  HENT:     Right Ear: Tympanic membrane normal.     Left Ear: Tympanic membrane normal.     Nose: Nose normal.     Mouth/Throat:     Mouth: Mucous membranes are moist.     Pharynx: No oropharyngeal exudate or posterior oropharyngeal erythema.  Eyes:     General: No  scleral icterus.    Conjunctiva/sclera: Conjunctivae normal.  Cardiovascular:     Rate and Rhythm: Normal rate and regular rhythm.     Heart sounds: Murmur heard.     Systolic murmur is present with a grade of 1/6.     No diastolic murmur is present.     No friction rub. No gallop.  Pulmonary:     Effort: No respiratory distress.     Breath sounds: No stridor. No wheezing, rhonchi or rales.  Chest:     Chest wall: No tenderness.  Abdominal:     General: Abdomen is flat. There is no distension.     Tenderness: There is no abdominal tenderness. There is no guarding or rebound.     Hernia: No hernia is present.  Musculoskeletal:     Cervical back: Neck supple.     Right lower leg: No edema.     Left lower leg: No edema.  Lymphadenopathy:     Cervical: No cervical adenopathy.  Skin:    General: Skin is warm and dry.     Findings: Bruising present. No petechiae.  Neurological:     General: No focal deficit present.     Mental Status: She is alert. Mental status is at baseline.  Psychiatric:        Mood and Affect: Mood normal.        Behavior: Behavior normal.     Lab Results  Component Value Date   WBC 6.3 02/11/2023   HGB 15.8 (H) 02/11/2023   HCT 46.0 02/11/2023   PLT 120.0 (L) 02/11/2023   GLUCOSE 104 (H) 02/11/2023   CHOL 195 02/11/2023   TRIG 67.0 02/11/2023   HDL 77.90 02/11/2023   LDLDIRECT 171.0 01/27/2013   LDLCALC 104 (H) 02/11/2023   ALT 18 02/11/2023   AST 27 02/11/2023   NA 138 02/11/2023   K 4.5 02/11/2023   CL 101 02/11/2023   CREATININE 0.93 02/11/2023   BUN 11 02/11/2023   CO2 31 02/11/2023   TSH 0.50 07/16/2022   INR 3.7 (H) 02/11/2023   HGBA1C 5.3 12/22/2018   DG Chest 2 View  Result Date: 02/11/2023 CLINICAL DATA:  Productive cough, night sweats for 3 weeks EXAM: CHEST - 2 VIEW COMPARISON:  06/26/2021 FINDINGS: Frontal and lateral views of the chest demonstrate postsurgical changes from median sternotomy and aortic valve replacement.  Cardiac silhouette is unremarkable. No airspace disease, effusion, or pneumothorax. No acute bony abnormalities. IMPRESSION: 1. Stable chest, no acute process. Electronically Signed   By: Randa Ngo M.D.   On: 02/11/2023 16:26     Assessment & Plan:   Hyperlipidemia with target LDL less than 130 - LDL goal achieved. Doing well on the statin  -  Lipid panel; Future -     Hepatic function panel; Future  Thrombocytopenia ([place])- Platelets are improved.  Her INR is supratherapeutic so I have recommended a decrease in her Coumadin dose. -     CBC with Differential/Platelet; Future -     Vitamin B12; Future -     Folate; Future -     Protime-INR; Future  Essential hypertension, benign- Her blood pressure is not adequately well-controlled.  She will continue working on her lifestyle modifications. -     Basic metabolic panel; Future  Subacute cough- Chest x-ray is negative for mass or infiltrate.  Will treat for lower respiratory tract infection. -     DG Chest 2 View; Future  Tobacco abuse  Long term (current) use of anticoagulants [Z79.01] -     Protime-INR; Future  Bipolar disorder, in partial remission, most recent episode mixed (HCC) -     lamoTRIgine; Take 2 tablets (400 mg total) by mouth daily.  Dispense: 180 tablet; Refill: 0  LRTI (lower respiratory tract infection) -     Amoxicillin-Pot Clavulanate; Take 1 tablet by mouth 2 (two) times daily for 7 days.  Dispense: 14 tablet; Refill: 0  S/P aortic valve replacement with metallic valve  Aortic stenosis due to bicuspid aortic valve     Follow-up: Return in about 3 months (around 05/14/2023).  [name], MD

## 2023-02-13 ENCOUNTER — Encounter: Payer: Self-pay | Admitting: Internal Medicine

## 2023-02-18 ENCOUNTER — Ambulatory Visit: Payer: Managed Care, Other (non HMO) | Attending: Cardiology

## 2023-02-18 DIAGNOSIS — Z954 Presence of other heart-valve replacement: Secondary | ICD-10-CM | POA: Diagnosis not present

## 2023-02-18 DIAGNOSIS — Z7901 Long term (current) use of anticoagulants: Secondary | ICD-10-CM

## 2023-02-18 LAB — POCT INR: INR: 2.3 (ref 2.0–3.0)

## 2023-02-18 NOTE — Patient Instructions (Signed)
Description   *PLEASE PUT ON APPT NO ONSITE INTERPRETER NEEDED*   Continue taking warfarin 1.5 tablets daily except for 1 tablet on Monday, Wednesdays, and Fridays. Stay consistent with greens each week (2-3 per week)  Recheck INR in 4 weeks  [facility] [phone]      

## 2023-02-23 ENCOUNTER — Other Ambulatory Visit: Payer: Self-pay | Admitting: Family Medicine

## 2023-03-08 ENCOUNTER — Other Ambulatory Visit: Payer: Self-pay | Admitting: Family Medicine

## 2023-03-10 NOTE — Progress Notes (Unsigned)
Gynecologic Oncology Return Clinic Visit  03/11/23  Reason for Visit: follow-up  Treatment History: Dysplasia history:  03/19/22: Vaginal cuff biopsy - VAIN1; left buttocks biopsy - VIN3, Perineal biopsy - VIN 2-3, mons biopsy - VIN 2-3. 01/2022 Vaginal pap - negative, HR HPV positive 12/2018: Pap - negative, HR HPV positive (negative for 16, 18 and 45) 05/2017: vaginal biopsies - one with benign squamous mucosa, other with VAIN I 04/2017: Vaginal pap - negative, HR HPV not detected. 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 Vaginal biopsies, one with VAIN 2, other VAIN 1. 10/2015: vaginal cuff biopsy: VAIN II.  Vulvar biopsies performed including mons and right labia, both showing condyloma. 08/2015: Vaginal pap - LSIL, HR HPV positive. 02/2014: Vaginal cuff biopsy showed condylomata acuminata, p16 negative for diffuse positive staining.  No malignancy or high-grade dysplasia.  Right labial biopsy shows condyloma. 01/2014: Vaginal pap - LSIL.  Reactive squamous cells present. HR HPV positive. 01/2009: Pathology from hysterectomy shows chronic mild inflammation of the cervix and endocervix, mesonephric hyperplasia.  No evidence of malignancy.  Secretory type endometrium. 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  Endo and ectocervical margins negative for mucosa but partially denuded.  Stromal invasion noted. 10/2008: Cervical biopsy at 6:00 shows HPV effect consistent with low-grade dysplasia.  ECC with 1 fragment of ectocervical/squamous metaplasia with low-grade intraepithelial lesion and focal changes consistent with CIN-2. 07/2020: ECC shows benign endocervix, no dysplasia. 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  Cervical biopsy at 1:00 showed squamous mucosa with koilocytic atypia, no dysplasia identified.  ECC shows rare detached fragments of slightly  dysplastic squamous mucosa with benign endocervical mucosa.   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   07/23/22: Vulvar biopsy given symptoms at prior healing incision shows VIN3.    08/12/22: Partial simple anterior left vulvectomy.  Interval History: The patient reports doing well.  She has some mild irritation if she wipes along her perineum, has been doing much better using the bidet.  Denies any bleeding or discharge.  Denies vulvar pain or irritation otherwise.  Has not used clobetasol in 7 or 8 weeks.  Past Medical/Surgical History: Past Medical History:  Diagnosis Date   ADD (attention deficit disorder)    Alcohol abuse, in remission 2012   per pt in remission since 2012   Anticoagulated on Coumadin    coumadin--- managed by cardiology/ coumadin clinic   Arthritis    In hips   Asthma    allergy induced, rare inhaler use   Auditory processing disorder    per pt very HOH, first language ASL   Bipolar disorder    Chronic left hip pain    Chronic vertigo    Complication of anesthesia    per pt due to auditory processing disorder pt is unable to hear when waking up and also gets very anxious   GAD (generalized anxiety disorder)    GERD (gastroesophageal reflux disease)    History of cervical dysplasia    History of condyloma acuminatum 2017   s/p laser ablation vulva   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   biopsy and CO2 laser ablation   HOH (hard of hearing)    per pt very HOH due to auditroy processing disorder   Hyperlipidemia    Hypertension    Muscle spasms of both lower extremities  both hips   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   23 mm Sorin [employer] [place] bileaflet mechanical valve via right anterior mini thoracotomy   Vulvar dysplasia     Past Surgical History:  Procedure Laterality Date   ANTERIOR CRUCIATE LIGAMENT REPAIR  1993   AORTIC  VALVE REPLACEMENT N/A 11/03/2017   Procedure: MINIMALLY INVASIVE AORTIC VALVE REPLACEMENT( MINI THORACOTOMY);  Surgeon: Rexene Alberts, MD;  Location: [place];  Service: Open Heart Surgery;  Laterality: N/A;   CERVICAL BIOPSY  W/ LOOP ELECTRODE EXCISION  2010   CIN III w/extension to glands   CLOSED REDUCTION HIP DISLOCATION Left 2831   CO2 LASER APPLICATION N/A 04/16/6159   Procedure: CO2 LASER APPLICATION TO VULVA;  Surgeon: Lafonda Mosses, MD;  Location: [facility];  Service: Gynecology;  Laterality: N/A;   COLPOSCOPY  10/30/2008   CIN I & II   COLPOSCOPY  07/31/2000   Neg. ECC   COLPOSCOPY  06/30/2001   CIN I   COLPOSCOPY  08/30/2004   ECC--atypia   COLPOSCOPY N/A 01/21/2016   Procedure: COLPOSCOPY with vaginal biopsy with CO 2 Laser of Vaginal and vulvar condyloma;  Surgeon: [name], MD;  Location: [facility];  Service: Gynecology;  Laterality: N/A;  [name] will be here 2/21 for 1115 case confirmed 01/16/15 - TS   HARDWARE REMOVAL Left 1992   Left hip   INGUINAL HERNIA REPAIR Bilateral    1981;  10982   LEFT AND RIGHT HEART CATHETERIZATION WITH CORONARY ANGIOGRAM N/A 05/18/2014   Procedure: LEFT AND RIGHT HEART CATHETERIZATION WITH CORONARY ANGIOGRAM;  Surgeon: Jettie Booze, MD;  Location: [facility];  Service: Cardiovascular;  Laterality: N/A;   LESION REMOVAL Right 05/12/2022   Procedure: Maudie Mercury OF RIGHT CHEEK CYST;  Surgeon: Cindra Presume, MD;  Location: [place];  Service: Plastics;  Laterality: Right;   LYMPH NODE BIOPSY  1995   NASAL SEPTUM SURGERY  2002   ORIF HIP FRACTURE Left 1990   per pt and  took out growth plate in Right knee   ROBOTIC ASSISTED TOTAL HYSTERECTOMY  02/26/2009   '@[facility]'$    TEE WITHOUT CARDIOVERSION N/A 11/03/2017   Procedure: TRANSESOPHAGEAL ECHOCARDIOGRAM (TEE);  Surgeon: Rexene Alberts, MD;  Location: [place];  Service: Open Heart Surgery;  Laterality: N/A;   TONSILLECTOMY AND ADENOIDECTOMY  1990    TYMPANOSTOMY TUBE PLACEMENT Bilateral    x3  per pt last one [place] BIOPSY N/A 05/12/2022   Procedure: POSSIBLE VULVAR BIOPSY; POSSIBLE VAGINAL BIOPSY;  Surgeon: Lafonda Mosses, MD;  Location: [facility];  Service: Gynecology;  Laterality: N/A;   VULVECTOMY N/A 05/12/2022   Procedure: WIDE EXCISION VULVECTOMY;  Surgeon: Lafonda Mosses, MD;  Location: [facility];  Service: Gynecology;  Laterality: N/A;   VULVECTOMY N/A 08/12/2022   Procedure: WIDE LOCAL EXCISION VULVECTOMY;  Surgeon: Lafonda Mosses, MD;  Location: [facility];  Service: Gynecology;  Laterality: N/A;    Family History  Problem Relation Age of Onset   Hyperlipidemia Mother    Hypertension Mother    Thyroid disease Mother    Hyperlipidemia Father    Hypertension Father    Migraines Father    Hypertension Brother    Hyperlipidemia Brother    Thyroid disease Maternal Grandmother    Thyroid disease Maternal Grandfather    Prostate cancer Maternal Grandfather  Colon cancer Paternal Great-grandmother     Social History   Socioeconomic History   Marital status: Single    Spouse name: Not on file   Number of children: Not on file   Years of education: Not on file   Highest education level: Not on file  Occupational History   Not on file  Tobacco Use   Smoking status: Every Day    Packs/day: 0.25    Years: 20.00    Additional pack years: 0.00    Total pack years: 5.00    Types: Cigarettes   Smokeless tobacco: Never  Vaping Use   Vaping Use: Never used  Substance and Sexual Activity   Alcohol use: No    Comment: in remission since 2012   Drug use: Yes    Types: Marijuana    Comment: 08-11-22 last, per pt smokes on average 1/4oz per week   Sexual activity: Yes    Partners: Male    Birth control/protection: Surgical    Comment: Hysterectomy  Other Topics Concern   Not on file  Social History Narrative   Not on file   Social  Determinants of Health   Financial Resource Strain: Low Risk  (12/10/2018)   Overall Financial Resource Strain (CARDIA)    Difficulty of Paying Living Expenses: Not hard at all  Food Insecurity: Unknown (12/10/2018)   Hunger Vital Sign    Worried About Running Out of Food in the Last Year: Patient declined    Ran Out of Food in the Last Year: Patient declined  Transportation Needs: Unknown (12/10/2018)   PRAPARE - Administrator, Civil Service (Medical): Patient declined    Lack of Transportation (Non-Medical): Patient declined  Physical Activity: Unknown (12/10/2018)   Exercise Vital Sign    Days of Exercise per Week: Patient declined    Minutes of Exercise per Session: Patient declined  Stress: Not on file  Social Connections: Unknown (12/10/2018)   Social Connection and Isolation Panel [NHANES]    Frequency of Communication with Friends and Family: Patient declined    Frequency of Social Gatherings with Friends and Family: Patient declined    Attends Religious Services: Patient declined    Database administrator or Organizations: Patient declined    Attends Banker Meetings: Patient declined    Marital Status: Patient declined    Current Medications:  Current Outpatient Medications:    albuterol (VENTOLIN HFA) 108 (90 Base) MCG/ACT inhaler, Inhale 2 puffs into the lungs every 6 (six) hours as needed for wheezing or shortness of breath., Disp: 1 each, Rfl: 3   aspirin EC 81 MG tablet, Take 81 mg by mouth daily. Swallow whole., Disp: , Rfl:    clobetasol ointment (TEMOVATE) 0.05 %, Apply 1 Application topically 2 (two) times daily., Disp: 30 g, Rfl: 0   DULoxetine (CYMBALTA) 20 MG capsule, TAKE 2 CAPSULES BY MOUTH DAILY, Disp: 60 capsule, Rfl: 0   fluconazole (DIFLUCAN) 150 MG tablet, Take 1 tablet (150 mg total) by mouth daily., Disp: 1 tablet, Rfl: 0   gabapentin (NEURONTIN) 300 MG capsule, Take 2 capsules (600 mg total) by mouth 3 (three) times daily., Disp:  540 capsule, Rfl: 1   lamoTRIgine (LAMICTAL) 200 MG tablet, Take 2 tablets (400 mg total) by mouth daily., Disp: 180 tablet, Rfl: 0   lansoprazole (PREVACID) 15 MG capsule, Take 15 mg by mouth daily as needed (for heartburn or acid reflux)., Disp: , Rfl:    lidocaine (XYLOCAINE) 5 % ointment,  Apply 1 Application topically as needed for moderate pain (to the vulva)., Disp: 35.44 g, Rfl: 1   meclizine (ANTIVERT) 12.5 MG tablet, TAKE ONE TABLET BY MOUTH TWICE A DAY AS NEEDED FOR DIZZINESS (Patient taking differently: 2 (two) times daily as needed.), Disp: 20 tablet, Rfl: 0   metoprolol tartrate (LOPRESSOR) 25 MG tablet, TAKE 1/2 TABLET BY MOUTH DAILY, Disp: 45 tablet, Rfl: 1   rosuvastatin (CRESTOR) 20 MG tablet, TAKE 1 TABLET BY MOUTH DAILY, Disp: 90 tablet, Rfl: 0   SSD 1 % cream, APPLY 1 APPLICAITION TOPICALLY DAILY, Disp: 50 g, Rfl: 0   tiZANidine (ZANAFLEX) 4 MG tablet, Take 1 tablet (4 mg total) by mouth at bedtime., Disp: 30 tablet, Rfl: 0   traMADol (ULTRAM) 50 MG tablet, TAKE 2 TABLETS BY MOUTH TWICE A DAY, Disp: 120 tablet, Rfl: 0   warfarin (COUMADIN) 7.5 MG tablet, TAKE 1 TO 1 AND 1/2 TABLET BY MOUTH DAILY AS DIRECTED BY COUMADIN CLINIC, Disp: 40 tablet, Rfl: 2  Review of Systems: + anxiety Denies appetite changes, fevers, chills, fatigue, unexplained weight changes. Denies hearing loss, neck lumps or masses, mouth sores, ringing in ears or voice changes. Denies cough or wheezing.  Denies shortness of breath. Denies chest pain or palpitations. Denies leg swelling. Denies abdominal distention, pain, blood in stools, constipation, diarrhea, nausea, vomiting, or early satiety. Denies pain with intercourse, dysuria, frequency, hematuria or incontinence. Denies hot flashes, pelvic pain, vaginal bleeding or vaginal discharge.   Denies joint pain, back pain or muscle pain/cramps. Denies itching, rash, or wounds. Denies dizziness, headaches, numbness or seizures. Denies swollen lymph nodes  or glands, denies easy bruising or bleeding. Denies depression, confusion, or decreased concentration.  Physical Exam: BP (!) 146/87 (BP Location: Left Arm, Patient Position: Sitting)   Pulse 71   Temp 98 F (36.7 C) (Oral)   Wt 114 lb 8 oz (51.9 kg)   SpO2 97%   BMI 23.13 kg/m  General: Alert, oriented, no acute distress.   GU: No areas of leukoplakia or desquamation posteriorly.  The skin along the perineum is somewhat thinned but otherwise normal in appearance.  This area of thin skin borders the superior edge of a hemorrhoid.  No other vulvar areas concerning for dysplasia.  Several hypopigmented lesions consistent with prior laser therapy.  Laboratory & Radiologic Studies: None new  Assessment & Plan: Danielle Obrien is a 43 y.o. 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  Developed symptoms at the site of her wide local excision in the setting of margin positive for high-grade dysplasia.  Now status post repeat excision with negative margins.  Biopsies from other areas of CO2 laser ablation showed hyperkeratotic squamous tissue, no dysplasia or malignancy. Recently developed new perianal lesion along the posterior perineum.  No improvement with clobetasol.  Biopsy revealed reactive squamous mucosa with hyper parakeratosis and focal erosion.  No dysplasia.  She is doing very well.  Has not used any clobetasol in the last 8 weeks and is basically asymptomatic.  Vulva without concerning lesions.  Plan for surveillance visit in 4 months.  Reviewed signs and symptoms that should prompt a phone call sooner than that.  22 minutes of total time was spent for this patient encounter, including preparation, face-to-face counseling with the patient and coordination of care, and documentation of the encounter.  Eugene Garnet, MD  Division of Gynecologic Oncology  Department of Obstetrics and Gynecology  [facility]

## 2023-03-11 ENCOUNTER — Inpatient Hospital Stay: Payer: Managed Care, Other (non HMO) | Attending: Gynecologic Oncology | Admitting: Gynecologic Oncology

## 2023-03-11 ENCOUNTER — Other Ambulatory Visit: Payer: Self-pay

## 2023-03-11 ENCOUNTER — Encounter: Payer: Self-pay | Admitting: Gynecologic Oncology

## 2023-03-11 VITALS — BP 146/87 | HR 71 | Temp 98.0°F | Wt 114.5 lb

## 2023-03-11 DIAGNOSIS — Z86002 Personal history of in-situ neoplasm of other and unspecified genital organs: Secondary | ICD-10-CM | POA: Insufficient documentation

## 2023-03-11 DIAGNOSIS — N9089 Other specified noninflammatory disorders of vulva and perineum: Secondary | ICD-10-CM

## 2023-03-11 DIAGNOSIS — Z9079 Acquired absence of other genital organ(s): Secondary | ICD-10-CM | POA: Insufficient documentation

## 2023-03-11 DIAGNOSIS — D071 Carcinoma in situ of vulva: Secondary | ICD-10-CM

## 2023-03-11 NOTE — Patient Instructions (Signed)
It was good to see you today.    I will see you for follow-up in 4 months.  As always, if you develop any new and concerning symptoms before your next visit, please call to see me sooner.

## 2023-03-18 ENCOUNTER — Ambulatory Visit: Payer: Managed Care, Other (non HMO) | Attending: Cardiology | Admitting: *Deleted

## 2023-03-18 DIAGNOSIS — Z7901 Long term (current) use of anticoagulants: Secondary | ICD-10-CM

## 2023-03-18 DIAGNOSIS — Z954 Presence of other heart-valve replacement: Secondary | ICD-10-CM | POA: Diagnosis not present

## 2023-03-18 LAB — POCT INR: POC INR: 3.7

## 2023-03-18 NOTE — Patient Instructions (Signed)
Description   *PLEASE PUT ON APPT NO ONSITE INTERPRETER NEEDED*   Hold warfarin tomorrow and then continue taking warfarin 1.5 tablets daily except for 1 tablet on Monday, Wednesdays, and Fridays. Stay consistent with greens each week (2-3 per week)  Recheck INR in 3 weeks  Coumadin Clinic [phone]

## 2023-03-30 ENCOUNTER — Other Ambulatory Visit: Payer: Self-pay | Admitting: Family Medicine

## 2023-04-01 ENCOUNTER — Inpatient Hospital Stay: Payer: Managed Care, Other (non HMO)

## 2023-04-01 ENCOUNTER — Inpatient Hospital Stay: Payer: Managed Care, Other (non HMO) | Attending: Gynecologic Oncology | Admitting: Genetic Counselor

## 2023-04-01 ENCOUNTER — Other Ambulatory Visit: Payer: Self-pay | Admitting: Genetic Counselor

## 2023-04-01 DIAGNOSIS — D071 Carcinoma in situ of vulva: Secondary | ICD-10-CM

## 2023-04-01 DIAGNOSIS — Z8049 Family history of malignant neoplasm of other genital organs: Secondary | ICD-10-CM

## 2023-04-01 LAB — GENETIC SCREENING ORDER

## 2023-04-05 ENCOUNTER — Encounter: Payer: Self-pay | Admitting: Genetic Counselor

## 2023-04-05 ENCOUNTER — Other Ambulatory Visit: Payer: Self-pay | Admitting: Family Medicine

## 2023-04-05 ENCOUNTER — Other Ambulatory Visit: Payer: Self-pay | Admitting: Cardiology

## 2023-04-05 DIAGNOSIS — Q230 Congenital stenosis of aortic valve: Secondary | ICD-10-CM

## 2023-04-05 DIAGNOSIS — Z7901 Long term (current) use of anticoagulants: Secondary | ICD-10-CM

## 2023-04-05 DIAGNOSIS — Z954 Presence of other heart-valve replacement: Secondary | ICD-10-CM

## 2023-04-05 DIAGNOSIS — Z8049 Family history of malignant neoplasm of other genital organs: Secondary | ICD-10-CM | POA: Insufficient documentation

## 2023-04-05 NOTE — Telephone Encounter (Signed)
Prescription refill request received for warfarin [place]: 05/08/22 ([place])  Next INR check: 04/08/23 Warfarin tablet strength: 7.5mg   Appropriate dose. Refill sent.

## 2023-04-05 NOTE — Progress Notes (Unsigned)
REFERRING PROVIDER: Etta Grandchild, MD [address] [place],  [place] [address]  PRIMARY PROVIDER:  Etta Grandchild, MD  PRIMARY REASON FOR VISIT:  1. Family history of uterine cancer   2. VIN III (vulvar intraepithelial neoplasia III)      HISTORY OF PRESENT ILLNESS:   Ms. Ruthenberg, a 42 y.o. female, was seen for a [facility] cancer genetics consultation at the request of Dr. Yetta Barre due to a family history of SDHA pathogenic variant identified in her mother.  Ms. Garand presents to clinic today to discuss the possibility of a hereditary predisposition to cancer, genetic testing, and to further clarify her future cancer risks, as well as potential cancer risks for family members.   In 2023, at the age of 25, Ms. Duprey was diagnosed with cancer of the vulvar. The treatment plan surgery.      CANCER HISTORY:  Oncology History   No history exists.     RISK FACTORS:  Menarche was at age 45.  First live birth at age N/A.  OCP use for approximately  <1  years.  Ovaries intact: one ovary.  Hysterectomy: yes.  Menopausal status: premenopausal.  HRT use: 0 years. Colonoscopy: no; not examined. Mammogram within the last year: yes. Number of breast biopsies: 0. Up to date with pelvic exams: yes. Any excessive radiation exposure in the past: no  Past Medical History:  Diagnosis Date   ADD (attention deficit disorder)    Alcohol abuse, in remission 2012   per pt in remission since 2012   Anticoagulated on Coumadin    coumadin--- managed by cardiology/ coumadin clinic   Arthritis    In hips   Asthma    allergy induced, rare inhaler use   Auditory processing disorder    per pt very HOH, first language ASL   Bipolar disorder (HCC)    Chronic left hip pain    Chronic vertigo    Complication of anesthesia    per pt due to auditory processing disorder pt is unable to hear when waking up and also gets very anxious   Family history of uterine cancer    GAD (generalized anxiety  disorder)    GERD (gastroesophageal reflux disease)    History of cervical dysplasia    History of condyloma acuminatum 2017   s/p laser ablation vulva   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   biopsy and CO2 laser ablation   HOH (hard of hearing)    per pt very HOH due to auditroy processing disorder   Hyperlipidemia    Hypertension    Muscle spasms of both lower extremities    both hips   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   23 mm Sorin [employer] [place] bileaflet mechanical valve via right anterior mini thoracotomy   Vulvar dysplasia     Past Surgical History:  Procedure Laterality Date   ANTERIOR CRUCIATE LIGAMENT REPAIR  1993   AORTIC VALVE REPLACEMENT N/A 11/03/2017   Procedure: MINIMALLY INVASIVE AORTIC VALVE REPLACEMENT( MINI THORACOTOMY);  Surgeon: Purcell Nails, MD;  [facility];  Service: Open Heart Surgery;  Laterality: N/A;   CERVICAL BIOPSY  W/ LOOP ELECTRODE EXCISION  2010   CIN III w/extension to glands   CLOSED REDUCTION HIP DISLOCATION Left 1981   CO2 LASER APPLICATION N/A 05/12/2022   Procedure: CO2 LASER APPLICATION TO VULVA;  Surgeon: Carver Fila, MD;  Location: [facility];  Service: Gynecology;  Laterality: N/A;   COLPOSCOPY  10/30/2008   CIN I & II   COLPOSCOPY  07/31/2000   Neg. ECC   COLPOSCOPY  06/30/2001   CIN I   COLPOSCOPY  08/30/2004   ECC--atypia   COLPOSCOPY N/A 01/21/2016   Procedure: COLPOSCOPY with vaginal biopsy with CO 2 Laser of Vaginal and vulvar condyloma;  Surgeon: Patton Salles, MD;  Location: [facility] ORS;  Service: Gynecology;  Laterality: N/A;  [name] will be here 2/21 for 1115 case confirmed 01/16/15 - TS   HARDWARE REMOVAL Left 1992   Left hip   INGUINAL HERNIA REPAIR Bilateral    1981;  [id number]   LEFT AND RIGHT HEART CATHETERIZATION WITH CORONARY ANGIOGRAM N/A 05/18/2014   Procedure: LEFT AND RIGHT HEART CATHETERIZATION WITH CORONARY  ANGIOGRAM;  Surgeon: [name] Crafts, MD;  Location: [facility] CATH LAB;  Service: Cardiovascular;  Laterality: N/A;   LESION REMOVAL Right 05/12/2022   Procedure: Sharen Hint OF RIGHT CHEEK CYST;  Surgeon: Allena Napoleon, MD;  Location: [facility];  Service: Plastics;  Laterality: Right;   LYMPH NODE BIOPSY  1995   NASAL SEPTUM SURGERY  2002   ORIF HIP FRACTURE Left 1990   per pt and  took out growth plate in Right knee   ROBOTIC ASSISTED TOTAL HYSTERECTOMY  02/26/2009   @[facility]    TEE WITHOUT CARDIOVERSION N/A 11/03/2017   Procedure: TRANSESOPHAGEAL ECHOCARDIOGRAM (TEE);  Surgeon: Purcell Nails, MD;  Location: [facility] OR;  Service: Open Heart Surgery;  Laterality: N/A;   TONSILLECTOMY AND ADENOIDECTOMY  1990   TYMPANOSTOMY TUBE PLACEMENT Bilateral    x3  per pt last one 1982   VULVA /PERINEUM BIOPSY N/A 05/12/2022   Procedure: POSSIBLE VULVAR BIOPSY; POSSIBLE VAGINAL BIOPSY;  Surgeon: Carver Fila, MD;  Location: [facility];  Service: Gynecology;  Laterality: N/A;   VULVECTOMY N/A 05/12/2022   Procedure: WIDE EXCISION VULVECTOMY;  Surgeon: Carver Fila, MD;  Location: [facility];  Service: Gynecology;  Laterality: N/A;   VULVECTOMY N/A 08/12/2022   Procedure: WIDE LOCAL EXCISION VULVECTOMY;  Surgeon: Carver Fila, MD;  Location: [facility];  Service: Gynecology;  Laterality: N/A;    Social History   Socioeconomic History   Marital status: Single    Spouse name: Not on file   Number of children: Not on file   Years of education: Not on file   Highest education level: Not on file  Occupational History   Not on file  Tobacco Use   Smoking status: Every Day    Packs/day: 0.25    Years: 20.00    Additional pack years: 0.00    Total pack years: 5.00    Types: Cigarettes   Smokeless tobacco: Never  Vaping Use   Vaping Use: Never used  Substance and Sexual Activity   Alcohol use: No    Comment: in remission since  2012   Drug use: Yes    Types: Marijuana    Comment: 08-11-22 last, per pt smokes on average 1/4oz per week   Sexual activity: Yes    Partners: Male    Birth control/protection: Surgical    Comment: Hysterectomy  Other Topics Concern   Not on file  Social History Narrative   Not on file   Social Determinants of Health   Financial Resource Strain: Low Risk  (12/10/2018)   Overall Financial Resource Strain (CARDIA)    Difficulty of Paying Living Expenses: Not hard at all  Food Insecurity: Unknown (12/10/2018)   Hunger Vital Sign    Worried About Running Out of Food in the Last Year: Patient declined    Ran Out of Food in the Last Year: Patient declined  Transportation Needs: Unknown (12/10/2018)   PRAPARE - Administrator, Civil Service (Medical): Patient declined    Lack of Transportation (Non-Medical): Patient declined  Physical Activity: Unknown (12/10/2018)   Exercise Vital Sign    Days of Exercise per Week: Patient declined    Minutes of Exercise per Session: Patient declined  Stress: Not on file  Social Connections: Unknown (12/10/2018)   Social Connection and Isolation Panel [NHANES]    Frequency of Communication with Friends and Family: Patient declined    Frequency of Social Gatherings with Friends and Family: Patient declined    Attends Religious Services: Patient declined    Database administrator or Organizations: Patient declined    Attends Banker Meetings: Patient declined    Marital Status: Patient declined     FAMILY HISTORY:  We obtained a detailed, 4-generation family history.  Significant diagnoses are listed below: Family History  Problem Relation Age of Onset   Hyperlipidemia Mother    Hypertension Mother    Thyroid disease Mother    Other Mother        Pituitary tumor   Hyperlipidemia Father    Hypertension Father    Migraines Father    Skin cancer Father    Hypertension Brother    Hyperlipidemia Brother    Heart attack  Paternal Uncle    Thyroid disease Maternal Grandmother    Uterine cancer Maternal Grandmother 65   Dementia Maternal Grandmother    Thyroid disease Maternal Grandfather    Prostate cancer Maternal Grandfather 57   Dementia Paternal Grandmother    Heart attack Paternal Grandfather    Colon cancer Paternal Great-grandmother     Ms. Slates is {aware/unaware} of previous family history of genetic testing for hereditary cancer risks. Patient's maternal ancestors are of *** descent, and paternal ancestors are of *** descent. There {IS NO:12509} reported Ashkenazi Jewish ancestry. There {IS NO:12509} known consanguinity.  GENETIC COUNSELING ASSESSMENT: Ms. Sivertsen is a 41 y.o. female with a family history of a known pathogenic variant in SDHA and predisposition to neuroendocrine tumors given this known pathogenic variant. We, therefore, discussed and recommended the following at today's visit.   DISCUSSION: We discussed that, in general, most cancer is not inherited in families, but instead is sporadic or familial. Sporadic cancers occur by chance and typically happen at older ages (>50 years) as this type of cancer is caused by genetic changes acquired during an individual's lifetime. Some families have more cancers than would be expected by chance; however, the ages or types of cancer are not consistent with a known genetic mutation or known genetic mutations have been ruled out. This type of familial cancer is thought to be due to a combination of multiple genetic, environmental, hormonal, and lifestyle factors. While this combination of factors likely increases the risk of cancer, the exact source of this risk is not currently identifiable or testable.  The patient's mother underwent genetic testing due to her family history of cancer.  She was found to have a pathogenic variant in SDHA.  Pathogenic variants in SDHA increase the risk for tumors within the paraganglia tissue called paragangliomas and  pheochromocytomas.  They can also increase the risk for GIST tumors. Tumors in the pituitary gland, and RCC.  The  vast majority of SDHA tumors are benign.  Ms. Hwa has a 50% chance of having inherited the SDHA variant from her mother.  If she inherited this variant, then we recommend annual blood exams or 24 hour urine collections screening, as well as imagine every 2-3 years.  We would refer her to a local endocrinologist for follow up.  Due to the SDHA variant being the only condition found on the maternal testing, and no family history of cancers on the paternal side, Ms. Oregel decided to pursue genetic testing for the [facility] single pathogenic variant identified in her mother.  This testing should be complementary due to the testing laboratory's policy.   PLAN: After considering the risks, benefits, and limitations, Ms. Hochhalter provided informed consent to pursue genetic testing and the blood sample was sent to [facility] for analysis of the SDHA pathogenic variant. Results should be available within approximately 2-3 weeks' time, at which point they will be disclosed by telephone to Ms. Dorame, as will any additional recommendations warranted by these results. Ms. Huso will receive a summary of her genetic counseling visit and a copy of her results once available. This information will also be available in Epic.   Lastly, we encouraged Ms. Golden to remain in contact with cancer genetics annually so that we can continuously update the family history and inform her of any changes in cancer genetics and testing that may be of benefit for this family.   Ms. Ralls's questions were answered to her satisfaction today. Our contact information was provided should additional questions or concerns arise. Thank you for the referral and allowing [place] to share in the care of your patient.   Amarius Toto P. Lowell Guitar, MS, [facility] Licensed, Patent attorney [email or web address] phone:  [phone]  The patient was seen for a total of 35 minutes in face-to-face genetic counseling.  The patient was seen alone along with a virtual sign language interpreter.  Drs. Meliton Rattan, and/or [place] were available for questions, if needed..    _______________________________________________________________________ For Office Staff:  Number of people involved in session: 1 Was an Intern/ student involved with case: no

## 2023-04-07 ENCOUNTER — Other Ambulatory Visit: Payer: Self-pay | Admitting: Internal Medicine

## 2023-04-08 ENCOUNTER — Ambulatory Visit: Payer: Managed Care, Other (non HMO)

## 2023-04-23 ENCOUNTER — Telehealth: Payer: Self-pay | Admitting: Genetic Counselor

## 2023-04-23 NOTE — Telephone Encounter (Signed)
Spoke with patient but she is at work and cannot talk.  She asked that I call back on Tuesday AM, as she does not work then.

## 2023-04-27 ENCOUNTER — Telehealth: Payer: Self-pay | Admitting: Genetic Counselor

## 2023-04-27 NOTE — Telephone Encounter (Signed)
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  Discussed that typically SDHA vairants do not increase the risk for cancer, but instead are associated with benign HNPGL.  Offered to bring her back in to clinic to discuss results again, vs proceeding with referring to endocrinology.  She would like to have both she and her mother come back to clinic and discuss this further.  She will call back to set up an appointment.

## 2023-04-30 ENCOUNTER — Other Ambulatory Visit: Payer: Self-pay | Admitting: Family Medicine

## 2023-05-03 ENCOUNTER — Telehealth: Payer: Self-pay | Admitting: Genetic Counselor

## 2023-05-03 NOTE — Telephone Encounter (Signed)
LM on VM regarding scheduling a return appointment to discuss genetic test results.

## 2023-05-07 ENCOUNTER — Other Ambulatory Visit: Payer: Self-pay | Admitting: Family Medicine

## 2023-05-09 ENCOUNTER — Other Ambulatory Visit: Payer: Self-pay | Admitting: Internal Medicine

## 2023-05-09 DIAGNOSIS — F3177 Bipolar disorder, in partial remission, most recent episode mixed: Secondary | ICD-10-CM

## 2023-05-13 NOTE — Telephone Encounter (Signed)
Spoke with patient.  She will talk with her mother this weekend and will call next week.

## 2023-06-01 ENCOUNTER — Other Ambulatory Visit: Payer: Self-pay | Admitting: Family Medicine

## 2023-06-06 ENCOUNTER — Other Ambulatory Visit: Payer: Self-pay | Admitting: Family Medicine

## 2023-06-08 ENCOUNTER — Telehealth: Payer: Self-pay | Admitting: Cardiology

## 2023-06-08 NOTE — Telephone Encounter (Signed)
Spoke to [place] at Dr. Yetta Barre office (PCP) and she was able to schedule patient for 06/14/23 at 2:40 pm w/ Dr. Alvy Bimler. Called patient who agreed to appointment time.

## 2023-06-08 NOTE — Telephone Encounter (Signed)
Patient calling in stating she is having some bruising on right front torso just below her rib cage.  She states she has a purple bruise the size of a quarter below her right ribs. She states she does not remember injuring the area but works in [employer] at [employer] and may have bumped into a box or another tool. She states she had a painful muscle spasm earlier today at work on her right side and is concerned that this is related to her bruised area or to her history of valve repair.   Patient last seen by Dr. Mayford Knife in February 2023, seen by Juliane Lack for preop clearance on 05/08/22. Last seen in anticoag clinic 03/18/23, was told not to take her coumadin today due to elevated INR of 3.7. Patient denies SOB, chest pain but states that when she experienced muscle cramp earlier today she became lightheaded and left work. Forwarded to Dr. Mayford Knife and to anti-coag clinic.

## 2023-06-08 NOTE — Telephone Encounter (Signed)
Pt called in stating she has a bruise underneath her rib cage. She states "its pulling on the plate in my chest and the muscles died." She also stated she left lightheaded. She asked what she should do, she does not want to go to ED. Please advise.

## 2023-06-08 NOTE — Telephone Encounter (Signed)
Patient w/ anticoag clinic appt on 06/11/23. Spoke to DOD Dr. Shari Prows who advises that patient needs to go to ED or urgent care if she is concerned about INR levels or muscle pain/bruising or if she feels her condition needs attention today. Relayed these recommendations  to patient who verbalizes understanding and agrees to plan.

## 2023-06-11 ENCOUNTER — Ambulatory Visit: Payer: Managed Care, Other (non HMO) | Attending: Cardiology

## 2023-06-11 DIAGNOSIS — Z954 Presence of other heart-valve replacement: Secondary | ICD-10-CM

## 2023-06-11 DIAGNOSIS — Z7901 Long term (current) use of anticoagulants: Secondary | ICD-10-CM | POA: Diagnosis not present

## 2023-06-11 LAB — POCT INR: INR: 3.3 — AB (ref 2.0–3.0)

## 2023-06-11 NOTE — Patient Instructions (Signed)
Description   *PLEASE PUT ON APPT NO ONSITE INTERPRETER NEEDED*   Only take 1 tablet tomorrow and then START taking warfarin 1.5 tablets daily except for 1 tablet on Sunday, Monday, Wednesdays, and Fridays.  Stay consistent with greens each week (2 per week)  Recheck INR in 2 weeks  Coumadin Clinic [phone]

## 2023-06-14 ENCOUNTER — Ambulatory Visit: Payer: Managed Care, Other (non HMO) | Admitting: Emergency Medicine

## 2023-06-14 ENCOUNTER — Ambulatory Visit (INDEPENDENT_AMBULATORY_CARE_PROVIDER_SITE_OTHER): Payer: Managed Care, Other (non HMO)

## 2023-06-14 ENCOUNTER — Encounter: Payer: Self-pay | Admitting: Emergency Medicine

## 2023-06-14 VITALS — BP 136/78 | HR 76 | Temp 98.3°F | Ht 59.0 in | Wt 115.5 lb

## 2023-06-14 DIAGNOSIS — R079 Chest pain, unspecified: Secondary | ICD-10-CM

## 2023-06-14 DIAGNOSIS — R1010 Upper abdominal pain, unspecified: Secondary | ICD-10-CM

## 2023-06-14 LAB — CBC WITH DIFFERENTIAL/PLATELET
Basophils Absolute: 0 10*3/uL (ref 0.0–0.1)
Basophils Relative: 0.6 % (ref 0.0–3.0)
Eosinophils Absolute: 0.1 10*3/uL (ref 0.0–0.7)
Eosinophils Relative: 2.3 % (ref 0.0–5.0)
HCT: 45.1 % (ref 36.0–46.0)
Hemoglobin: 14.9 g/dL (ref 12.0–15.0)
Lymphocytes Relative: 45.3 % (ref 12.0–46.0)
Lymphs Abs: 2.9 10*3/uL (ref 0.7–4.0)
MCHC: 33 g/dL (ref 30.0–36.0)
MCV: 94.4 fl (ref 78.0–100.0)
Monocytes Absolute: 0.5 10*3/uL (ref 0.1–1.0)
Monocytes Relative: 7.3 % (ref 3.0–12.0)
Neutro Abs: 2.8 10*3/uL (ref 1.4–7.7)
Neutrophils Relative %: 44.5 % (ref 43.0–77.0)
Platelets: 121 10*3/uL — ABNORMAL LOW (ref 150.0–400.0)
RBC: 4.78 Mil/uL (ref 3.87–5.11)
RDW: 13.6 % (ref 11.5–15.5)
WBC: 6.4 10*3/uL (ref 4.0–10.5)

## 2023-06-14 NOTE — Assessment & Plan Note (Signed)
Clinically stable.  No red flag signs or symptoms. Normal chest x-ray.  Symptoms not of cardiac etiology. Recommend blood work today and abdominal ultrasound

## 2023-06-14 NOTE — Patient Instructions (Signed)
Abdominal Pain, Adult  Many things can cause belly (abdominal) pain. In most cases, belly pain is not a serious problem and can be watched and treated at home. But in some cases, it can be serious. Your doctor will try to find the cause of your belly pain. Follow these instructions at home: Medicines Take over-the-counter and prescription medicines only as told by your doctor. Do not take medicines that help you poop (laxatives) unless told by your doctor. General instructions Watch your belly pain for any changes. Tell your doctor if the pain gets worse. Drink enough fluid to keep your pee (urine) pale yellow. Contact a doctor if: Your belly pain changes or gets worse. You have very bad cramping or bloating in your belly. You vomit. Your pain gets worse with meals, after eating, or with certain foods. You have trouble pooping or have watery poop for more than 2-3 days. You are not hungry, or you lose weight without trying. You have signs of not getting enough fluid or water (dehydration). These may include: Dark pee, very little pee, or no pee. Cracked lips or dry mouth. Feeling sleepy or weak. You have pain when you pee or poop. Your belly pain wakes you up at night. You have blood in your pee. You have a fever. Get help right away if: You cannot stop vomiting. Your pain is only in one part of your belly, like on the right side. You have bloody or black poop, or poop that looks like tar. You have trouble breathing. You have chest pain. These symptoms may be an emergency. Get help right away. Call 911. Do not wait to see if the symptoms will go away. Do not drive yourself to the hospital. This information is not intended to replace advice given to you by your health care provider. Make sure you discuss any questions you have with your health care provider. Document Revised: 09/02/2022 Document Reviewed: 09/02/2022 Elsevier Patient Education  2024 Elsevier Inc.  

## 2023-06-14 NOTE — Assessment & Plan Note (Signed)
Clinically stable.  No red flag signs or symptoms. Differential diagnosis discussed.  Needs workup. Normal chest x-ray done today. Recommend blood work Recommend abdominal ultrasound Needs to also follow-up with PCP

## 2023-06-14 NOTE — Progress Notes (Signed)
Adalberto Ill 43 y.o.   Chief Complaint  Patient presents with   Rib Injury    Patient states she having some spasms in her right rib, patient stated it started from a bruise.    Referral    Patient states she needs a referral to cardiac and  thoracic surgery due to previous surgery     HISTORY OF PRESENT ILLNESS: Acute problem visit today.  Patient of Dr. Sanda Linger. This is a 42 y.o. female complaining of pain to right side of chest and abdomen, mostly upper abdomen, this started 6 days ago.  Pain waxes and wanes.  Smoker.  No EtOH use.  Able to eat and drink.  Denies nausea or vomiting.  Moving bowels.  Denied rectal bleeding or melena. History of aortic valve replacement in the past.  Multiple other surgeries. Complicated medical history.  HPI   Prior to Admission medications   Medication Sig Start Date End Date Taking? Authorizing Provider  albuterol (VENTOLIN HFA) 108 (90 Base) MCG/ACT inhaler Inhale 2 puffs into the lungs every 6 (six) hours as needed for wheezing or shortness of breath. 01/27/22  Yes Etta Grandchild, MD  aspirin EC 81 MG tablet Take 81 mg by mouth daily. Swallow whole.   Yes [provider]  clobetasol ointment (TEMOVATE) 0.05 % Apply 1 Application topically 2 (two) times daily. 12/11/22  Yes Carver Fila, MD  DULoxetine (CYMBALTA) 20 MG capsule TAKE 2 CAPSULES BY MOUTH DAILY 06/07/23  Yes Antoine Primas M, DO  fluconazole (DIFLUCAN) 150 MG tablet Take 1 tablet (150 mg total) by mouth daily. 09/08/22  Yes Carver Fila, MD  gabapentin (NEURONTIN) 300 MG capsule Take 2 capsules (600 mg total) by mouth 3 (three) times daily. 01/11/23  Yes Antoine Primas M, DO  lamoTRIgine (LAMICTAL) 200 MG tablet TAKE 2 TABLETS BY MOUTH DAILY 05/09/23  Yes Etta Grandchild, MD  lansoprazole (PREVACID) 15 MG capsule Take 15 mg by mouth daily as needed (for heartburn or acid reflux).   Yes [provider]  lidocaine (XYLOCAINE) 5 % ointment Apply 1  Application topically as needed for moderate pain (to the vulva). 05/19/22  Yes Cross, Melissa D, NP  meclizine (ANTIVERT) 12.5 MG tablet TAKE ONE TABLET BY MOUTH TWICE A DAY AS NEEDED FOR DIZZINESS Patient taking differently: 2 (two) times daily as needed. 09/19/20  Yes Judi Saa, DO  metoprolol tartrate (LOPRESSOR) 25 MG tablet TAKE 1/2 TABLET BY MOUTH DAILY 01/08/23  Yes Quintella Reichert, MD  rosuvastatin (CRESTOR) 20 MG tablet TAKE 1 TABLET BY MOUTH DAILY 04/07/23  Yes Etta Grandchild, MD  SSD 1 % cream APPLY 1 APPLICAITION TOPICALLY DAILY 05/25/22  Yes Carver Fila, MD  tiZANidine (ZANAFLEX) 4 MG tablet TAKE 1 TABLET BY MOUTH AT BEDTIME 04/30/23  Yes Antoine Primas M, DO  traMADol (ULTRAM) 50 MG tablet TAKE 2 TABLETS BY MOUTH TWICE A DAY 06/01/23  Yes Antoine Primas M, DO  warfarin (COUMADIN) 7.5 MG tablet TAKE 1 TO TAKE 1 AND 1/2 (1-1.5) TABLET BY MOUTH DAILY AS DIRECTED BY COUMADIN CLINIC 04/05/23  Yes Quintella Reichert, MD    Allergies  Allergen Reactions   Demerol Nausea And Vomiting   Ibuprofen Other (See Comments)    Gastritis     Patient Active Problem List   Diagnosis Date Noted   Family history of uterine cancer 04/05/2023   Subacute cough 02/11/2023   Thrombocytopenia (HCC) 07/16/2022   History of vaginal dysplasia  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   Deviated septum 01/02/2021   Degenerative disc disease, cervical 05/21/2020   Cervical cancer screening 12/22/2018   LRTI (lower respiratory tract infection) 02/01/2018   Long term (current) use of anticoagulants [Z79.01] 11/11/2017   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   Chronic, continuous use of opioids 09/06/2017   Tobacco abuse 09/27/2015   SI (sacroiliac) joint dysfunction 02/20/2015   Leg length discrepancy 02/20/2015   Allergic rhinitis, cause unspecified 05/04/2014   Aortic stenosis due to bicuspid aortic valve 02/20/2014    Bipolar disorder (HCC) 02/05/2014   Migraine with status migrainosus 01/27/2013   Hyperlipidemia with target LDL less than 130 01/27/2013   Routine general medical examination at a health care facility 10/20/2011   Mild persistent asthma 04/21/2011   Essential hypertension, benign 04/21/2011    Past Medical History:  Diagnosis Date   ADD (attention deficit disorder)    Alcohol abuse, in remission 2012   per pt in remission since 2012   Anticoagulated on Coumadin    coumadin--- managed by cardiology/ coumadin clinic   Arthritis    In hips   Asthma    allergy induced, rare inhaler use   Auditory processing disorder    per pt very HOH, first language ASL   Bipolar disorder (HCC)    Chronic left hip pain    Chronic vertigo    Complication of anesthesia    per pt due to auditory processing disorder pt is unable to hear when waking up and also gets very anxious   Family history of uterine cancer    GAD (generalized anxiety disorder)    GERD (gastroesophageal reflux disease)    History of cervical dysplasia    History of condyloma acuminatum 2017   s/p laser ablation vulva   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   biopsy and CO2 laser ablation   HOH (hard of hearing)    per pt very HOH due to auditroy processing disorder   Hyperlipidemia    Hypertension    Muscle spasms of both lower extremities    both hips   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   23 mm Sorin [employer] [place] bileaflet mechanical valve via right anterior mini thoracotomy   Vulvar dysplasia     Past Surgical History:  Procedure Laterality Date   ANTERIOR CRUCIATE LIGAMENT REPAIR  1993   AORTIC VALVE REPLACEMENT N/A 11/03/2017   Procedure: MINIMALLY INVASIVE AORTIC VALVE REPLACEMENT( MINI THORACOTOMY);  Surgeon: Purcell Nails, MD;  Location: [facility];  Service: Open Heart Surgery;  Laterality: N/A;   CERVICAL BIOPSY  W/ LOOP ELECTRODE EXCISION   2010   CIN III w/extension to glands   CLOSED REDUCTION HIP DISLOCATION Left 1981   CO2 LASER APPLICATION N/A 05/12/2022   Procedure: CO2 LASER APPLICATION TO VULVA;  Surgeon: Carver Fila, MD;  Location: [facility];  Service: Gynecology;  Laterality: N/A;   COLPOSCOPY  10/30/2008   CIN I & II   COLPOSCOPY  07/31/2000   Neg. ECC   COLPOSCOPY  06/30/2001   CIN I   COLPOSCOPY  08/30/2004   ECC--atypia   COLPOSCOPY N/A 01/21/2016   Procedure: COLPOSCOPY with vaginal biopsy with CO 2 Laser of Vaginal and vulvar condyloma;  Surgeon: Patton Salles, MD;  Location: [facility];  Service: Gynecology;  Laterality: N/A;  [name] will be here 2/21 for 1115  case confirmed 01/16/15 - TS   HARDWARE REMOVAL Left 1992   Left hip   INGUINAL HERNIA REPAIR Bilateral    1981;  [id number]   LEFT AND RIGHT HEART CATHETERIZATION WITH CORONARY ANGIOGRAM N/A 05/18/2014   Procedure: LEFT AND RIGHT HEART CATHETERIZATION WITH CORONARY ANGIOGRAM;  Surgeon: [name] Crafts, MD;  Location: [facility] CATH LAB;  Service: Cardiovascular;  Laterality: N/A;   LESION REMOVAL Right 05/12/2022   Procedure: Sharen Hint OF RIGHT CHEEK CYST;  Surgeon: Allena Napoleon, MD;  Location: [facility];  Service: Plastics;  Laterality: Right;   LYMPH NODE BIOPSY  1995   NASAL SEPTUM SURGERY  2002   ORIF HIP FRACTURE Left 1990   per pt and  took out growth plate in Right knee   ROBOTIC ASSISTED TOTAL HYSTERECTOMY  02/26/2009   @[facility]    TEE WITHOUT CARDIOVERSION N/A 11/03/2017   Procedure: TRANSESOPHAGEAL ECHOCARDIOGRAM (TEE);  Surgeon: Purcell Nails, MD;  Location: [facility] OR;  Service: Open Heart Surgery;  Laterality: N/A;   TONSILLECTOMY AND ADENOIDECTOMY  1990   TYMPANOSTOMY TUBE PLACEMENT Bilateral    x3  per pt last one 1982   VULVA /PERINEUM BIOPSY N/A 05/12/2022   Procedure: POSSIBLE VULVAR BIOPSY; POSSIBLE VAGINAL BIOPSY;  Surgeon: Carver Fila, MD;  Location: [facility];  Service:  Gynecology;  Laterality: N/A;   VULVECTOMY N/A 05/12/2022   Procedure: WIDE EXCISION VULVECTOMY;  Surgeon: Carver Fila, MD;  Location: [facility];  Service: Gynecology;  Laterality: N/A;   VULVECTOMY N/A 08/12/2022   Procedure: WIDE LOCAL EXCISION VULVECTOMY;  Surgeon: Carver Fila, MD;  Location: [facility];  Service: Gynecology;  Laterality: N/A;    Social History   Socioeconomic History   Marital status: Single    Spouse name: Not on file   Number of children: Not on file   Years of education: Not on file   Highest education level: Not on file  Occupational History   Not on file  Tobacco Use   Smoking status: Every Day    Current packs/day: 0.25    Average packs/day: 0.3 packs/day for 20.0 years (5.0 ttl pk-yrs)    Types: Cigarettes   Smokeless tobacco: Never  Vaping Use   Vaping status: Never Used  Substance and Sexual Activity   Alcohol use: No    Comment: in remission since 2012   Drug use: Yes    Types: Marijuana    Comment: 08-11-22 last, per pt smokes on average 1/4oz per week   Sexual activity: Yes    Partners: Male    Birth control/protection: Surgical    Comment: Hysterectomy  Other Topics Concern   Not on file  Social History Narrative   Not on file   Social Determinants of Health   Financial Resource Strain: Low Risk  (12/10/2018)   Overall Financial Resource Strain (CARDIA)    Difficulty of Paying Living Expenses: Not hard at all  Food Insecurity: Unknown (12/10/2018)   Hunger Vital Sign    Worried About Running Out of Food in the Last Year: Patient declined    Ran Out of Food in the Last Year: Patient declined  Transportation Needs: Unknown (12/10/2018)   PRAPARE - Transportation    Lack of Transportation (Medical): Patient declined    Lack of Transportation (Non-Medical): Patient declined  Physical Activity: Unknown (12/10/2018)   Exercise Vital Sign    Days of Exercise per Week: Patient declined     Minutes of  Exercise per Session: Patient declined  Stress: Not on file  Social Connections: Unknown (12/10/2018)   Social Connection and Isolation Panel [NHANES]    Frequency of Communication with Friends and Family: Patient declined    Frequency of Social Gatherings with Friends and Family: Patient declined    Attends Religious Services: Patient declined    Database administrator or Organizations: Patient declined    Attends Banker Meetings: Patient declined    Marital Status: Patient declined  Intimate Partner Violence: Unknown (12/10/2018)   Humiliation, Afraid, Rape, and Kick questionnaire    Fear of Current or Ex-Partner: Patient declined    Emotionally Abused: Patient declined    Physically Abused: Patient declined    Sexually Abused: Patient declined    Family History  Problem Relation Age of Onset   Hyperlipidemia Mother    Hypertension Mother    Thyroid disease Mother    Other Mother        Pituitary tumor   Hyperlipidemia Father    Hypertension Father    Migraines Father    Skin cancer Father    Hypertension Brother    Hyperlipidemia Brother    Heart attack Paternal Uncle    Thyroid disease Maternal Grandmother    Uterine cancer Maternal Grandmother 47   Dementia Maternal Grandmother    Thyroid disease Maternal Grandfather    Prostate cancer Maternal Grandfather 51   Dementia Paternal Grandmother    Heart attack Paternal Grandfather    Colon cancer Paternal Great-grandmother      Review of Systems  Constitutional: Negative.  Negative for chills and fever.  HENT: Negative.  Negative for congestion and sore throat.   Respiratory: Negative.  Negative for cough and shortness of breath.   Cardiovascular: Negative.  Negative for chest pain.  Gastrointestinal:  Positive for abdominal pain. Negative for diarrhea, nausea and vomiting.  Genitourinary: Negative.  Negative for dysuria and hematuria.  Skin: Negative.   Neurological: Negative.  Negative for  dizziness and headaches.  All other systems reviewed and are negative.   Vitals:   06/14/23 1506  BP: 136/78  Pulse: 76  Temp: 98.3 F (36.8 C)  SpO2: 97%    Physical Exam Vitals reviewed.  Constitutional:      Appearance: Normal appearance.  HENT:     Head: Normocephalic.  Eyes:     Extraocular Movements: Extraocular movements intact.     Pupils: Pupils are equal, round, and reactive to light.  Cardiovascular:     Rate and Rhythm: Normal rate and regular rhythm.     Pulses: Normal pulses.     Heart sounds: Normal heart sounds.  Pulmonary:     Effort: Pulmonary effort is normal.     Breath sounds: Normal breath sounds.  Abdominal:     Palpations: Abdomen is soft.     Tenderness: There is abdominal tenderness (Right upper quadrant and epigastric areas). There is no right CVA tenderness, left CVA tenderness, guarding or rebound.  Musculoskeletal:     Cervical back: No tenderness.  Lymphadenopathy:     Cervical: No cervical adenopathy.  Skin:    General: Skin is warm and dry.     Capillary Refill: Capillary refill takes less than 2 seconds.  Neurological:     General: No focal deficit present.     Mental Status: She is alert and oriented to person, place, and time.  Psychiatric:        Mood and Affect: Mood normal.  Behavior: Behavior normal.    DG Chest 2 View  Result Date: 06/14/2023 CLINICAL DATA:  Chest pain/burning. History of asthma and aortic valve replacement. EXAM: CHEST - 2 VIEW COMPARISON:  Chest radiographs 02/11/2023 FINDINGS: The cardiomediastinal silhouette is unchanged renal heart size. Sequelae of aortic valve replacement are again identified. The lungs are well inflated and clear. No pleural effusion or pneumothorax is identified. No acute osseous abnormality is seen. IMPRESSION: No active cardiopulmonary disease. Electronically Signed   By: Sebastian Ache M.D.   On: 06/14/2023 15:49     ASSESSMENT & PLAN: A total of 42 minutes was spent with  the patient and counseling/coordination of care regarding preparing for this visit, review of most recent office visit notes, review of chronic medical conditions under management, review of all medications, differential diagnosis of upper abdominal pain and need for diagnostic workup, pain management, education on nutrition, prognosis, ED precautions, documentation and need for follow-up.  Problem List Items Addressed This Visit       Other   Upper abdominal pain - Primary    Clinically stable.  No red flag signs or symptoms. Differential diagnosis discussed.  Needs workup. Normal chest x-ray done today. Recommend blood work Recommend abdominal ultrasound Needs to also follow-up with PCP      Relevant Orders   CBC with Differential/Platelet   Comprehensive metabolic panel   DG Chest 2 View (Completed)   US Abdomen Complete   Lipase   Urinalysis   Right-sided chest pain    Clinically stable.  No red flag signs or symptoms. Normal chest x-ray.  Symptoms not of cardiac etiology. Recommend blood work today and abdominal ultrasound      Relevant Orders   CBC with Differential/Platelet   Comprehensive metabolic panel   DG Chest 2 View (Completed)   Patient Instructions  Abdominal Pain, Adult  Many things can cause belly (abdominal) pain. In most cases, belly pain is not a serious problem and can be watched and treated at home. But in some cases, it can be serious. Your doctor will try to find the cause of your belly pain. Follow these instructions at home: Medicines Take over-the-counter and prescription medicines only as told by your doctor. Do not take medicines that help you poop (laxatives) unless told by your doctor. General instructions Watch your belly pain for any changes. Tell your doctor if the pain gets worse. Drink enough fluid to keep your pee (urine) pale yellow. Contact a doctor if: Your belly pain changes or gets worse. You have very bad cramping or bloating  in your belly. You vomit. Your pain gets worse with meals, after eating, or with certain foods. You have trouble pooping or have watery poop for more than 2-3 days. You are not hungry, or you lose weight without trying. You have signs of not getting enough fluid or water (dehydration). These may include: Dark pee, very little pee, or no pee. Cracked lips or dry mouth. Feeling sleepy or weak. You have pain when you pee or poop. Your belly pain wakes you up at night. You have blood in your pee. You have a fever. Get help right away if: You cannot stop vomiting. Your pain is only in one part of your belly, like on the right side. You have bloody or black poop, or poop that looks like tar. You have trouble breathing. You have chest pain. These symptoms may be an emergency. Get help right away. Call 911. Do not wait to see if the  symptoms will go away. Do not drive yourself to the hospital. This information is not intended to replace advice given to you by your health care provider. Make sure you discuss any questions you have with your health care provider. Document Revised: 09/02/2022 Document Reviewed: 09/02/2022 Elsevier Patient Education  2024 Elsevier Inc.    Edwina Barth, MD [facility]

## 2023-06-15 LAB — COMPREHENSIVE METABOLIC PANEL
ALT: 20 U/L (ref 0–35)
AST: 25 U/L (ref 0–37)
Albumin: 4.7 g/dL (ref 3.5–5.2)
Alkaline Phosphatase: 104 U/L (ref 39–117)
BUN: 9 mg/dL (ref 6–23)
CO2: 29 mEq/L (ref 19–32)
Calcium: 10.1 mg/dL (ref 8.4–10.5)
Chloride: 107 mEq/L (ref 96–112)
Creatinine, Ser: 0.86 mg/dL (ref 0.40–1.20)
GFR: 82.94 mL/min (ref >60.00)
Glucose, Bld: 96 mg/dL (ref 70–99)
Potassium: 5.5 mEq/L — ABNORMAL HIGH (ref 3.5–5.1)
Sodium: 142 mEq/L (ref 135–145)
Total Bilirubin: 0.4 mg/dL (ref 0.2–1.2)
Total Protein: 7.5 g/dL (ref 6.0–8.3)

## 2023-06-15 LAB — URINALYSIS
Bilirubin Urine: NEGATIVE
Hgb urine dipstick: NEGATIVE
Ketones, ur: NEGATIVE
Leukocytes,Ua: NEGATIVE
Nitrite: NEGATIVE
Specific Gravity, Urine: <=1.005 — AB (ref 1.000–1.030)
Total Protein, Urine: NEGATIVE
Urine Glucose: NEGATIVE
Urobilinogen, UA: 0.2 (ref 0.0–1.0)
pH: 6 (ref 5.0–8.0)

## 2023-06-15 LAB — LIPASE: Lipase: 30 U/L (ref 11.0–59.0)

## 2023-06-21 ENCOUNTER — Telehealth: Payer: Self-pay | Admitting: Internal Medicine

## 2023-06-21 ENCOUNTER — Ambulatory Visit
Admission: RE | Admit: 2023-06-21 | Discharge: 2023-06-21 | Disposition: A | Discharge status: Home / Self Care | Payer: Managed Care, Other (non HMO) | Source: Ambulatory Visit | Attending: Emergency Medicine

## 2023-06-21 ENCOUNTER — Other Ambulatory Visit: Payer: Self-pay | Admitting: Emergency Medicine

## 2023-06-21 DIAGNOSIS — R1010 Upper abdominal pain, unspecified: Secondary | ICD-10-CM

## 2023-06-21 DIAGNOSIS — K8020 Calculus of gallbladder without cholecystitis without obstruction: Secondary | ICD-10-CM

## 2023-06-21 NOTE — Telephone Encounter (Signed)
Form placed on PCP desk

## 2023-06-21 NOTE — Telephone Encounter (Signed)
Pt dropped off paperwork that needs to be filed out, info placed in [employer] up front.

## 2023-06-22 ENCOUNTER — Telehealth: Payer: Self-pay | Admitting: Internal Medicine

## 2023-06-22 ENCOUNTER — Telehealth: Payer: Self-pay | Admitting: *Deleted

## 2023-06-22 ENCOUNTER — Encounter: Payer: Self-pay | Admitting: *Deleted

## 2023-06-22 NOTE — Telephone Encounter (Signed)
Pt called back returning call for [facility].

## 2023-06-22 NOTE — Telephone Encounter (Signed)
Work note completed, placed in provider box awaiting signature

## 2023-06-22 NOTE — Telephone Encounter (Signed)
Okay with me 

## 2023-06-22 NOTE — Telephone Encounter (Signed)
Patient states she needs an extension of her work note. She was unable to go to work yesterday due to another episode. She states is she can be out of work until she sees Personal assistant.   Please advise

## 2023-06-25 ENCOUNTER — Ambulatory Visit: Payer: Managed Care, Other (non HMO) | Attending: Cardiology

## 2023-06-25 DIAGNOSIS — Z7901 Long term (current) use of anticoagulants: Secondary | ICD-10-CM

## 2023-06-25 DIAGNOSIS — Q231 Congenital insufficiency of aortic valve: Secondary | ICD-10-CM | POA: Diagnosis not present

## 2023-06-25 DIAGNOSIS — Z954 Presence of other heart-valve replacement: Secondary | ICD-10-CM

## 2023-06-25 DIAGNOSIS — Q230 Congenital stenosis of aortic valve: Secondary | ICD-10-CM

## 2023-06-25 LAB — POCT INR: INR: 3.2 — AB (ref 2.0–3.0)

## 2023-06-25 NOTE — Patient Instructions (Signed)
*  PLEASE PUT ON APPT NO ONSITE INTERPRETER NEEDED*   Only take 0.5 TABLET TODAY THEN CONTINUE taking warfarin 1.5 tablets daily except for 1 tablet on Sunday, Monday, Wednesdays, and Fridays.  Stay consistent with greens each week (2 per week)  Recheck INR in 3 weeks  Coumadin Clinic [phone]

## 2023-06-29 ENCOUNTER — Other Ambulatory Visit: Payer: Self-pay | Admitting: Family Medicine

## 2023-07-06 ENCOUNTER — Other Ambulatory Visit: Payer: Self-pay | Admitting: Internal Medicine

## 2023-07-06 ENCOUNTER — Other Ambulatory Visit: Payer: Self-pay | Admitting: Family Medicine

## 2023-07-06 ENCOUNTER — Other Ambulatory Visit: Payer: Self-pay | Admitting: Cardiology

## 2023-07-07 ENCOUNTER — Other Ambulatory Visit: Payer: Self-pay

## 2023-07-07 ENCOUNTER — Inpatient Hospital Stay (HOSPITAL_COMMUNITY)
Admission: EM | Admit: 2023-07-07 | Discharge: 2023-07-15 | DRG: 419 | Disposition: A | Discharge status: Home / Self Care | Payer: Managed Care, Other (non HMO) | Attending: Family Medicine | Admitting: Family Medicine

## 2023-07-07 ENCOUNTER — Other Ambulatory Visit: Payer: Self-pay | Admitting: Family Medicine

## 2023-07-07 ENCOUNTER — Encounter (HOSPITAL_COMMUNITY): Payer: Self-pay

## 2023-07-07 ENCOUNTER — Emergency Department (HOSPITAL_COMMUNITY): Payer: Managed Care, Other (non HMO)

## 2023-07-07 DIAGNOSIS — F411 Generalized anxiety disorder: Secondary | ICD-10-CM | POA: Diagnosis present

## 2023-07-07 DIAGNOSIS — Z885 Allergy status to narcotic agent status: Secondary | ICD-10-CM

## 2023-07-07 DIAGNOSIS — F319 Bipolar disorder, unspecified: Secondary | ICD-10-CM | POA: Diagnosis present

## 2023-07-07 DIAGNOSIS — F988 Other specified behavioral and emotional disorders with onset usually occurring in childhood and adolescence: Secondary | ICD-10-CM | POA: Diagnosis present

## 2023-07-07 DIAGNOSIS — D696 Thrombocytopenia, unspecified: Secondary | ICD-10-CM | POA: Diagnosis not present

## 2023-07-07 DIAGNOSIS — K8050 Calculus of bile duct without cholangitis or cholecystitis without obstruction: Secondary | ICD-10-CM | POA: Diagnosis not present

## 2023-07-07 DIAGNOSIS — Z8249 Family history of ischemic heart disease and other diseases of the circulatory system: Secondary | ICD-10-CM

## 2023-07-07 DIAGNOSIS — Z800 Family history of malignant neoplasm of digestive organs: Secondary | ICD-10-CM

## 2023-07-07 DIAGNOSIS — Z7901 Long term (current) use of anticoagulants: Secondary | ICD-10-CM | POA: Diagnosis not present

## 2023-07-07 DIAGNOSIS — R791 Abnormal coagulation profile: Secondary | ICD-10-CM | POA: Diagnosis present

## 2023-07-07 DIAGNOSIS — Z952 Presence of prosthetic heart valve: Secondary | ICD-10-CM

## 2023-07-07 DIAGNOSIS — Z79899 Other long term (current) drug therapy: Secondary | ICD-10-CM

## 2023-07-07 DIAGNOSIS — I10 Essential (primary) hypertension: Secondary | ICD-10-CM | POA: Diagnosis present

## 2023-07-07 DIAGNOSIS — K8000 Calculus of gallbladder with acute cholecystitis without obstruction: Secondary | ICD-10-CM | POA: Diagnosis present

## 2023-07-07 DIAGNOSIS — Z808 Family history of malignant neoplasm of other organs or systems: Secondary | ICD-10-CM

## 2023-07-07 DIAGNOSIS — T45515A Adverse effect of anticoagulants, initial encounter: Secondary | ICD-10-CM | POA: Diagnosis present

## 2023-07-07 DIAGNOSIS — K660 Peritoneal adhesions (postprocedural) (postinfection): Secondary | ICD-10-CM | POA: Diagnosis present

## 2023-07-07 DIAGNOSIS — K8012 Calculus of gallbladder with acute and chronic cholecystitis without obstruction: Secondary | ICD-10-CM | POA: Diagnosis present

## 2023-07-07 DIAGNOSIS — R109 Unspecified abdominal pain: Principal | ICD-10-CM

## 2023-07-07 DIAGNOSIS — Z7982 Long term (current) use of aspirin: Secondary | ICD-10-CM

## 2023-07-07 DIAGNOSIS — F17210 Nicotine dependence, cigarettes, uncomplicated: Secondary | ICD-10-CM | POA: Diagnosis present

## 2023-07-07 DIAGNOSIS — Z8049 Family history of malignant neoplasm of other genital organs: Secondary | ICD-10-CM | POA: Diagnosis not present

## 2023-07-07 DIAGNOSIS — Z86001 Personal history of in-situ neoplasm of cervix uteri: Secondary | ICD-10-CM | POA: Diagnosis not present

## 2023-07-07 DIAGNOSIS — K219 Gastro-esophageal reflux disease without esophagitis: Secondary | ICD-10-CM | POA: Diagnosis present

## 2023-07-07 DIAGNOSIS — J4530 Mild persistent asthma, uncomplicated: Secondary | ICD-10-CM | POA: Diagnosis present

## 2023-07-07 DIAGNOSIS — E785 Hyperlipidemia, unspecified: Secondary | ICD-10-CM | POA: Diagnosis present

## 2023-07-07 DIAGNOSIS — K811 Chronic cholecystitis: Secondary | ICD-10-CM | POA: Diagnosis not present

## 2023-07-07 DIAGNOSIS — Z8349 Family history of other endocrine, nutritional and metabolic diseases: Secondary | ICD-10-CM | POA: Diagnosis not present

## 2023-07-07 DIAGNOSIS — Z720 Tobacco use: Secondary | ICD-10-CM | POA: Diagnosis present

## 2023-07-07 DIAGNOSIS — H9325 Central auditory processing disorder: Secondary | ICD-10-CM | POA: Diagnosis present

## 2023-07-07 DIAGNOSIS — Z83438 Family history of other disorder of lipoprotein metabolism and other lipidemia: Secondary | ICD-10-CM | POA: Diagnosis not present

## 2023-07-07 LAB — COMPREHENSIVE METABOLIC PANEL
ALT: 14 U/L (ref 0–44)
AST: 24 U/L (ref 15–41)
Albumin: 4.1 g/dL (ref 3.5–5.0)
Alkaline Phosphatase: 65 U/L (ref 38–126)
Anion gap: 9 (ref 5–15)
BUN: <5 mg/dL — ABNORMAL LOW (ref 6–20)
CO2: 21 mmol/L — ABNORMAL LOW (ref 22–32)
Calcium: 8.7 mg/dL — ABNORMAL LOW (ref 8.9–10.3)
Chloride: 106 mmol/L (ref 98–111)
Creatinine, Ser: 0.99 mg/dL (ref 0.44–1.00)
GFR, Estimated: >60 mL/min (ref >60)
Glucose, Bld: 110 mg/dL — ABNORMAL HIGH (ref 70–99)
Potassium: 3.7 mmol/L (ref 3.5–5.1)
Sodium: 136 mmol/L (ref 135–145)
Total Bilirubin: 0.6 mg/dL (ref 0.3–1.2)
Total Protein: 6.9 g/dL (ref 6.5–8.1)

## 2023-07-07 LAB — URINALYSIS, ROUTINE W REFLEX MICROSCOPIC
Bilirubin Urine: NEGATIVE
Glucose, UA: NEGATIVE mg/dL
Ketones, ur: NEGATIVE mg/dL
Nitrite: NEGATIVE
Protein, ur: NEGATIVE mg/dL
Specific Gravity, Urine: 1.002 — ABNORMAL LOW (ref 1.005–1.030)
pH: 5 (ref 5.0–8.0)

## 2023-07-07 LAB — CBC
HCT: 43.2 % (ref 36.0–46.0)
Hemoglobin: 14.6 g/dL (ref 12.0–15.0)
MCH: 30.7 pg (ref 26.0–34.0)
MCHC: 33.8 g/dL (ref 30.0–36.0)
MCV: 90.9 fL (ref 80.0–100.0)
Platelets: 103 10*3/uL — ABNORMAL LOW (ref 150–400)
RBC: 4.75 MIL/uL (ref 3.87–5.11)
RDW: 12.7 % (ref 11.5–15.5)
WBC: 5 10*3/uL (ref 4.0–10.5)
nRBC: 0 % (ref 0.0–0.2)

## 2023-07-07 LAB — HIV ANTIBODY (ROUTINE TESTING W REFLEX): HIV Screen 4th Generation wRfx: NONREACTIVE

## 2023-07-07 LAB — LIPASE, BLOOD: Lipase: 28 U/L (ref 11–51)

## 2023-07-07 LAB — PROTIME-INR
INR: 3.9 — ABNORMAL HIGH (ref 0.8–1.2)
Prothrombin Time: 38.1 seconds — ABNORMAL HIGH (ref 11.4–15.2)

## 2023-07-07 MED ORDER — PANTOPRAZOLE SODIUM 40 MG PO TBEC
40.0000 mg | DELAYED_RELEASE_TABLET | Freq: Every day | ORAL | Status: DC
  Administered 2023-07-07 – 2023-07-15 (×9): 40 mg via ORAL
  Filled 2023-07-07 (×9): qty 1

## 2023-07-07 MED ORDER — MECLIZINE HCL 12.5 MG PO TABS: 12.5000 mg | ORAL_TABLET | Freq: Two times a day (BID) | ORAL | Status: DC | PRN

## 2023-07-07 MED ORDER — METOPROLOL TARTRATE 12.5 MG HALF TABLET
12.5000 mg | ORAL_TABLET | Freq: Every day | ORAL | Status: DC
  Administered 2023-07-08 – 2023-07-15 (×8): 12.5 mg via ORAL
  Filled 2023-07-07 (×9): qty 1

## 2023-07-07 MED ORDER — LAMOTRIGINE 100 MG PO TABS
400.0000 mg | ORAL_TABLET | Freq: Every day | ORAL | Status: DC
  Administered 2023-07-08 – 2023-07-15 (×8): 400 mg via ORAL
  Filled 2023-07-07 (×8): qty 4
  Filled 2023-07-07: qty 1

## 2023-07-07 MED ORDER — ONDANSETRON HCL 4 MG PO TABS: 4.0000 mg | ORAL_TABLET | Freq: Four times a day (QID) | ORAL | Status: DC | PRN

## 2023-07-07 MED ORDER — GABAPENTIN 300 MG PO CAPS
600.0000 mg | ORAL_CAPSULE | Freq: Three times a day (TID) | ORAL | Status: DC
  Administered 2023-07-07 – 2023-07-15 (×23): 600 mg via ORAL
  Filled 2023-07-07 (×23): qty 2

## 2023-07-07 MED ORDER — ALBUTEROL SULFATE HFA 108 (90 BASE) MCG/ACT IN AERS: 2.0000 | INHALATION_SPRAY | Freq: Four times a day (QID) | RESPIRATORY_TRACT | Status: DC | PRN

## 2023-07-07 MED ORDER — ONDANSETRON HCL 4 MG/2ML IJ SOLN: 4.0000 mg | Freq: Four times a day (QID) | INTRAMUSCULAR | Status: DC | PRN

## 2023-07-07 MED ORDER — MORPHINE SULFATE (PF) 4 MG/ML IV SOLN
4.0000 mg | Freq: Once | INTRAVENOUS | Status: AC
  Administered 2023-07-07: 4 mg via INTRAVENOUS
  Filled 2023-07-07: qty 1

## 2023-07-07 MED ORDER — DULOXETINE HCL 20 MG PO CPEP
40.0000 mg | ORAL_CAPSULE | Freq: Every day | ORAL | Status: DC
  Administered 2023-07-08 – 2023-07-15 (×7): 40 mg via ORAL
  Filled 2023-07-07 (×9): qty 2

## 2023-07-07 MED ORDER — MORPHINE SULFATE (PF) 2 MG/ML IV SOLN
2.0000 mg | INTRAVENOUS | Status: DC | PRN
  Administered 2023-07-07 – 2023-07-11 (×24): 2 mg via INTRAVENOUS
  Filled 2023-07-07 (×24): qty 1

## 2023-07-07 MED ORDER — LACTATED RINGERS IV SOLN: INTRAVENOUS | Status: DC

## 2023-07-07 MED ORDER — TIZANIDINE HCL 2 MG PO TABS
4.0000 mg | ORAL_TABLET | Freq: Every day | ORAL | Status: DC
  Administered 2023-07-07 – 2023-07-11 (×5): 4 mg via ORAL
  Filled 2023-07-07 (×5): qty 2

## 2023-07-07 MED ORDER — ROSUVASTATIN CALCIUM 20 MG PO TABS
20.0000 mg | ORAL_TABLET | Freq: Every day | ORAL | Status: DC
  Administered 2023-07-08 – 2023-07-15 (×8): 20 mg via ORAL
  Filled 2023-07-07 (×10): qty 1

## 2023-07-07 NOTE — ED Notes (Signed)
Ultrasound at bedside

## 2023-07-07 NOTE — H&P (Addendum)
History and Physical    Patient: Danielle Obrien:[id number] DOB: 1981/06/12 DOA: 07/07/2023 DOS: the patient was seen and examined on 07/07/2023 PCP: Etta Grandchild, MD  Patient coming from: Home  Chief Complaint:  Chief Complaint  Patient presents with   Abdominal Pain   HPI: Danielle Obrien is a 42 y.o. female with medical history significant of essential hypertension, GERD, hyperlipidemia, ADD, asthma, history of valvular disease with chronic anticoagulation, was diagnosed with cholelithiasis on 06/25/2023.  She has been having right upper quadrant abdominal pain with evidence of the cholelithiasis.  Patient was supposed to be seen this Friday in the outpatient by general surgery.  Patient's pain has persisted so she came to the ER today after bouts of vomiting.  General surgery has seen the patient in the ER and recommends admission for possible laparoscopic cholecystectomy.  Patient however is on warfarin.  Surgery therefore wants patient admitted and manage her warfarin so she can have surgery.  Patient's INR today is 3.2.  Other lab work appears to be stable.  Patient will be admitted to on warfarin held.  Heparin will be started when INR is subtherapeutic.  This will be continued until surgery.  Review of Systems: As mentioned in the history of present illness. All other systems reviewed and are negative. Past Medical History:  Diagnosis Date   ADD (attention deficit disorder)    Alcohol abuse, in remission 2012   per pt in remission since 2012   Anticoagulated on Coumadin    coumadin--- managed by cardiology/ coumadin clinic   Arthritis    In hips   Asthma    allergy induced, rare inhaler use   Auditory processing disorder    per pt very HOH, first language ASL   Bipolar disorder (HCC)    Chronic left hip pain    Chronic vertigo    Complication of anesthesia    per pt due to auditory processing disorder pt is unable to hear when waking up and also gets very anxious    Family history of uterine cancer    GAD (generalized anxiety disorder)    GERD (gastroesophageal reflux disease)    History of cervical dysplasia    History of condyloma acuminatum 2017   s/p laser ablation vulva   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   biopsy and CO2 laser ablation   HOH (hard of hearing)    per pt very HOH due to auditroy processing disorder   Hyperlipidemia    Hypertension    Muscle spasms of both lower extremities    both hips   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   23 mm Sorin [employer] [place] bileaflet mechanical valve via right anterior mini thoracotomy   Vulvar dysplasia    Past Surgical History:  Procedure Laterality Date   ANTERIOR CRUCIATE LIGAMENT REPAIR  1993   AORTIC VALVE REPLACEMENT N/A 11/03/2017   Procedure: MINIMALLY INVASIVE AORTIC VALVE REPLACEMENT( MINI THORACOTOMY);  Surgeon: Purcell Nails, MD;  Location: [facility] OR;  Service: Open Heart Surgery;  Laterality: N/A;   CERVICAL BIOPSY  W/ LOOP ELECTRODE EXCISION  2010   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 ECC   COLPOSCOPY  06/30/2001   CIN I   COLPOSCOPY  08/30/2004   ECC--atypia   COLPOSCOPY N/A 01/21/2016   Procedure: COLPOSCOPY with vaginal biopsy with CO 2 Laser of Vaginal and vulvar condyloma;  Surgeon: Patton Salles, MD;  Location: [facility];  Service: Gynecology;  Laterality: N/A;  [name] will be here 2/21 for 1115 case confirmed 01/16/15 - TS   HARDWARE REMOVAL Left 1992   Left hip   INGUINAL HERNIA REPAIR Bilateral    1981;  [id number]   LEFT AND RIGHT HEART CATHETERIZATION WITH CORONARY ANGIOGRAM N/A 05/18/2014    Procedure: LEFT AND RIGHT HEART CATHETERIZATION WITH CORONARY ANGIOGRAM;  Surgeon: [name] Crafts, MD;  Location: [facility] CATH LAB;  Service: Cardiovascular;  Laterality: N/A;   LESION REMOVAL Right 05/12/2022   Procedure: Sharen Hint OF RIGHT CHEEK CYST;  Surgeon: Allena Napoleon, MD;  Location: [facility];  Service: Plastics;  Laterality: Right;   LYMPH NODE BIOPSY  1995   NASAL SEPTUM SURGERY  2002   ORIF HIP FRACTURE Left 1990   per pt and  took out growth plate in Right knee   ROBOTIC ASSISTED TOTAL HYSTERECTOMY  02/26/2009   @[facility]    TEE WITHOUT CARDIOVERSION N/A 11/03/2017   Procedure: TRANSESOPHAGEAL ECHOCARDIOGRAM (TEE);  Surgeon: Purcell Nails, MD;  Location: [facility] OR;  Service: Open Heart Surgery;  Laterality: N/A;   TONSILLECTOMY AND ADENOIDECTOMY  1990   TYMPANOSTOMY TUBE PLACEMENT Bilateral    x3  per pt last one 1982   VULVA /PERINEUM BIOPSY N/A 05/12/2022   Procedure: POSSIBLE VULVAR BIOPSY; POSSIBLE VAGINAL BIOPSY;  Surgeon: Carver Fila, MD;  Location: [facility];  Service: Gynecology;  Laterality: N/A;   VULVECTOMY N/A 05/12/2022   Procedure: WIDE EXCISION VULVECTOMY;  Surgeon: Carver Fila, MD;  Location: [facility];  Service: Gynecology;  Laterality: N/A;   VULVECTOMY N/A 08/12/2022   Procedure: WIDE LOCAL EXCISION VULVECTOMY;  Surgeon: Carver Fila, MD;  Location: [facility];  Service: Gynecology;  Laterality: N/A;   Social History:  reports that she has been smoking cigarettes. She has a 5 pack-year smoking history. She has never used smokeless tobacco. She reports current drug use. Drug: Marijuana. She reports that she does not drink alcohol.  Allergies  Allergen Reactions   Demerol Nausea And Vomiting   Ibuprofen Other (See Comments)    Gastritis     Family History  Problem Relation Age of Onset   Hyperlipidemia Mother    Hypertension Mother    Thyroid disease Mother    Other Mother         Pituitary tumor   Hyperlipidemia Father    Hypertension Father    Migraines Father    Skin cancer Father    Hypertension Brother    Hyperlipidemia Brother    Heart attack Paternal Uncle    Thyroid disease Maternal Grandmother    Uterine cancer Maternal Grandmother 80   Dementia Maternal Grandmother    Thyroid disease Maternal Grandfather    Prostate cancer Maternal Grandfather 54   Dementia Paternal Grandmother    Heart attack Paternal Grandfather    Colon cancer Paternal Great-grandmother     Prior to Admission medications   Medication Sig Start Date End Date Taking? Authorizing Provider  albuterol (VENTOLIN HFA) 108 (90 Base) MCG/ACT inhaler Inhale 2 puffs into the lungs every 6 (six) hours as needed for wheezing or  shortness of breath. 01/27/22   Etta Grandchild, MD  aspirin EC 81 MG tablet Take 81 mg by mouth daily. Swallow whole.    [provider]  clobetasol ointment (TEMOVATE) 0.05 % Apply 1 Application topically 2 (two) times daily. 12/11/22   Carver Fila, MD  DULoxetine (CYMBALTA) 20 MG capsule TAKE 2 CAPSULES BY MOUTH DAILY 07/07/23   Judi Saa, DO  fluconazole (DIFLUCAN) 150 MG tablet Take 1 tablet (150 mg total) by mouth daily. 09/08/22   Carver Fila, MD  gabapentin (NEURONTIN) 300 MG capsule Take 2 capsules (600 mg total) by mouth 3 (three) times daily. 01/11/23   Judi Saa, DO  lamoTRIgine (LAMICTAL) 200 MG tablet TAKE 2 TABLETS BY MOUTH DAILY 05/09/23   Etta Grandchild, MD  lansoprazole (PREVACID) 15 MG capsule Take 15 mg by mouth daily as needed (for heartburn or acid reflux).    [provider]  lidocaine (XYLOCAINE) 5 % ointment Apply 1 Application topically as needed for moderate pain (to the vulva). 05/19/22   Cross, Efraim Kaufmann D, NP  meclizine (ANTIVERT) 12.5 MG tablet TAKE ONE TABLET BY MOUTH TWICE A DAY AS NEEDED FOR DIZZINESS Patient taking differently: 2 (two) times daily as needed. 09/19/20   Judi Saa, DO   metoprolol tartrate (LOPRESSOR) 25 MG tablet Take 0.5 tablets (12.5 mg total) by mouth daily. Please keep scheduled appointment for future refills. Thank you. 07/06/23   Quintella Reichert, MD  rosuvastatin (CRESTOR) 20 MG tablet TAKE 1 TABLET BY MOUTH DAILY 07/06/23   Etta Grandchild, MD  SSD 1 % cream APPLY 1 APPLICAITION TOPICALLY DAILY 05/25/22   Carver Fila, MD  tiZANidine (ZANAFLEX) 4 MG tablet TAKE 1 TABLET BY MOUTH AT BEDTIME 04/30/23   Judi Saa, DO  traMADol (ULTRAM) 50 MG tablet TAKE 2 TABLETS BY MOUTH TWICE A DAY 06/30/23   Judi Saa, DO  warfarin (COUMADIN) 7.5 MG tablet TAKE 1 TO TAKE 1 AND 1/2 (1-1.5) TABLET BY MOUTH DAILY AS DIRECTED BY COUMADIN CLINIC 04/05/23   Quintella Reichert, MD    Physical Exam: Vitals:   07/07/23 1548 07/07/23 1845  BP: (!) 166/88 (!) 176/99  Pulse: 72 62  Resp: 16 15  Temp: 98.5 F (36.9 C)   TempSrc: Oral   SpO2: 97% 100%   Constitutional: NAD, calm, comfortable Eyes: PERRL, lids and conjunctivae normal ENMT: Mucous membranes are moist. Posterior pharynx clear of any exudate or lesions.Normal dentition.  Neck: normal, supple, no masses, no thyromegaly Respiratory: clear to auscultation bilaterally, no wheezing, no crackles. Normal respiratory effort. No accessory muscle use.  Cardiovascular: Regular rate and rhythm, no murmurs / rubs / gallops. No extremity edema. 2+ pedal pulses. No carotid bruits.  Abdomen: no tenderness, no masses palpated. No hepatosplenomegaly. Bowel sounds positive.  Musculoskeletal: Good range of motion, no joint swelling or tenderness, Skin: no rashes, lesions, ulcers. No induration Neurologic: CN 2-12 grossly intact. Sensation intact, DTR normal. Strength 5/5 in all 4.  Psychiatric: Normal judgment and insight. Alert and oriented x 3. Normal mood  Data Reviewed:  INR 3.2, glucose 110, platelets 103, calcium 8.7, CO2 21 and creatinine 0.99.  Assessment and Plan:  #1 acute cholelithiasis: Surgery  following patient.  Plan to have cholecystectomy when INR is in the right range.  Patient was scheduled to see the surgeon on Friday but due to the worsening pain in the right upper quadrant she came to the ER today.  Having pain  and emesis.  We will hold the warfarin.  INR is 3.2.  When it drops down to 2 or less than 2.5 we will start heparin drip.  Surgery will be done when INR is less than 1.5.  Heparin can be held 6 hours before surgery.  Warfarin will then be resumed afterwards.  #2 chronic anticoagulation: Holding warfarin.  Will start heparin drip when INR is less than 2.5.  #3 essential hypertension: Will continue with. home regimen.  Patient is on metoprolol.  #4 hyperlipidemia: Continue statin  #5 mild persistent asthma: Continue home breathing treatments.  #6 history of aortic stenosis: Status post minimally invasive aortic valve replacement with bileaflet mechanical valve.  Patient will need to be on anticoagulation following stopping warfarin we will restart heparin until she surgery is done.  #7 tobacco abuse: Counseling provided.  Nicotine patch will be offered.  #8 bipolar disorder: Continue Cymbalta    Advance Care Planning:   Code Status: Full Code   Consults: Dr. Sophronia Simas, general surgery  Family Communication: No family at bedside  Severity of Illness: The appropriate patient status for this patient is INPATIENT. Inpatient status is judged to be reasonable and necessary in order to provide the required intensity of service to ensure the patient's safety. The patient's presenting symptoms, physical exam findings, and initial radiographic and laboratory data in the context of their chronic comorbidities is felt to place them at high risk for further clinical deterioration. Furthermore, it is not anticipated that the patient will be medically stable for discharge from the hospital within 2 midnights of admission.   * I certify that at the point of admission it is my  clinical judgment that the patient will require inpatient hospital care spanning beyond 2 midnights from the point of admission due to high intensity of service, high risk for further deterioration and high frequency of surveillance required.*  AuthorLonia Blood, MD 07/07/2023 7:52 PM  For on call review [email or web address].

## 2023-07-07 NOTE — ED Provider Notes (Addendum)
[place] EMERGENCY DEPARTMENT AT [facility] Provider Note   CSN: [id number] Arrival date & time: 07/07/23  1543     History  Chief Complaint  Patient presents with   Abdominal Pain    Danielle Obrien is a 42 y.o. female.  42 year old female presents with rapid quadrant pain times several weeks.  Diagnosed with cholelithiasis a few weeks ago.  Was scheduled to see the surgeon this Friday.  Came today because of worsening pain in right upper quadrant.  No fever or chills.  Had emesis yesterday.  Pain is unresponsive to home medications.       Home Medications Prior to Admission medications   Medication Sig Start Date End Date Taking? Authorizing Provider  albuterol (VENTOLIN HFA) 108 (90 Base) MCG/ACT inhaler Inhale 2 puffs into the lungs every 6 (six) hours as needed for wheezing or shortness of breath. 01/27/22   Etta Grandchild, MD  aspirin EC 81 MG tablet Take 81 mg by mouth daily. Swallow whole.    [provider]  clobetasol ointment (TEMOVATE) 0.05 % Apply 1 Application topically 2 (two) times daily. 12/11/22   Carver Fila, MD  DULoxetine (CYMBALTA) 20 MG capsule TAKE 2 CAPSULES BY MOUTH DAILY 07/07/23   Judi Saa, DO  fluconazole (DIFLUCAN) 150 MG tablet Take 1 tablet (150 mg total) by mouth daily. 09/08/22   Carver Fila, MD  gabapentin (NEURONTIN) 300 MG capsule Take 2 capsules (600 mg total) by mouth 3 (three) times daily. 01/11/23   Judi Saa, DO  lamoTRIgine (LAMICTAL) 200 MG tablet TAKE 2 TABLETS BY MOUTH DAILY 05/09/23   Etta Grandchild, MD  lansoprazole (PREVACID) 15 MG capsule Take 15 mg by mouth daily as needed (for heartburn or acid reflux).    [provider]  lidocaine (XYLOCAINE) 5 % ointment Apply 1 Application topically as needed for moderate pain (to the vulva). 05/19/22   Cross, Efraim Kaufmann D, NP  meclizine (ANTIVERT) 12.5 MG tablet TAKE ONE TABLET BY MOUTH TWICE A DAY AS NEEDED FOR DIZZINESS Patient taking  differently: 2 (two) times daily as needed. 09/19/20   Judi Saa, DO  metoprolol tartrate (LOPRESSOR) 25 MG tablet Take 0.5 tablets (12.5 mg total) by mouth daily. Please keep scheduled appointment for future refills. Thank you. 07/06/23   Quintella Reichert, MD  rosuvastatin (CRESTOR) 20 MG tablet TAKE 1 TABLET BY MOUTH DAILY 07/06/23   Etta Grandchild, MD  SSD 1 % cream APPLY 1 APPLICAITION TOPICALLY DAILY 05/25/22   Carver Fila, MD  tiZANidine (ZANAFLEX) 4 MG tablet TAKE 1 TABLET BY MOUTH AT BEDTIME 04/30/23   Judi Saa, DO  traMADol (ULTRAM) 50 MG tablet TAKE 2 TABLETS BY MOUTH TWICE A DAY 06/30/23   Judi Saa, DO  warfarin (COUMADIN) 7.5 MG tablet TAKE 1 TO TAKE 1 AND 1/2 (1-1.5) TABLET BY MOUTH DAILY AS DIRECTED BY COUMADIN CLINIC 04/05/23   Quintella Reichert, MD      Allergies    Demerol and Ibuprofen    Review of Systems   Review of Systems  All other systems reviewed and are negative.   Physical Exam Updated Vital Signs BP (!) 166/88 (BP Location: Left Arm)   Pulse 72   Temp 98.5 F (36.9 C) (Oral)   Resp 16   SpO2 97%  Physical Exam Vitals and nursing note reviewed.  Constitutional:      General: She is not in acute distress.  Appearance: Normal appearance. She is well-developed. She is not toxic-appearing.  HENT:     Head: Normocephalic and atraumatic.  Eyes:     General: Lids are normal.     Conjunctiva/sclera: Conjunctivae normal.     Pupils: Pupils are equal, round, and reactive to light.  Neck:     Thyroid: No thyroid mass.     Trachea: No tracheal deviation.  Cardiovascular:     Rate and Rhythm: Normal rate and regular rhythm.     Heart sounds: Normal heart sounds. No murmur heard.    No gallop.  Pulmonary:     Effort: Pulmonary effort is normal. No respiratory distress.     Breath sounds: Normal breath sounds. No stridor. No decreased breath sounds, wheezing, rhonchi or rales.  Abdominal:     General: There is no distension.      Palpations: Abdomen is soft.     Tenderness: There is abdominal tenderness in the right upper quadrant. There is guarding. There is no rebound.    Musculoskeletal:        General: No tenderness. Normal range of motion.     Cervical back: Normal range of motion and neck supple.  Skin:    General: Skin is warm and dry.     Findings: No abrasion or rash.  Neurological:     Mental Status: She is alert and oriented to person, place, and time. Mental status is at baseline.     GCS: GCS eye subscore is 4. GCS verbal subscore is 5. GCS motor subscore is 6.     Cranial Nerves: Cranial nerves are intact. No cranial nerve deficit.     Sensory: No sensory deficit.     Motor: Motor function is intact.  Psychiatric:        Attention and Perception: Attention normal.        Speech: Speech normal.        Behavior: Behavior normal.     ED Results / Procedures / Treatments   Labs (all labs ordered are listed, but only abnormal results are displayed) Labs Reviewed  COMPREHENSIVE METABOLIC PANEL - Abnormal; Notable for the following components:      Result Value   CO2 21 (*)    Glucose, Bld 110 (*)    BUN <5 (*)    Calcium 8.7 (*)    All other components within normal limits  CBC - Abnormal; Notable for the following components:   Platelets 103 (*)    All other components within normal limits  LIPASE, BLOOD  URINALYSIS, ROUTINE W REFLEX MICROSCOPIC    EKG None  Radiology No results found.  Procedures Procedures    Medications Ordered in ED Medications  lactated ringers infusion (has no administration in time range)  morphine (PF) 4 MG/ML injection 4 mg (has no administration in time range)    ED Course/ Medical Decision Making/ A&P                                 Medical Decision Making Amount and/or Complexity of Data Reviewed Labs: ordered. Radiology: ordered. ECG/medicine tests: ordered.  Risk Prescription drug management.   Patient's labs noted here.  Will  consult general surgery due to ongoing pain.  Medicate with IV fluids as well as morphine.  Discussed with Dr. Sophronia Simas who will see the patient  7:33 PM Patient seen by general surgery who agrees that patient will require admission.  Request medicine to admit       Final Clinical Impression(s) / ED Diagnoses Final diagnoses:  None    Rx / DC Orders ED Discharge Orders     None         Lorre Nick, MD 07/07/23 Lynelle Smoke    Lorre Nick, MD 07/07/23 [phone]

## 2023-07-07 NOTE — Consult Note (Signed)
Danielle Obrien 1981-08-31  [id number].    Requesting MD: Dr. Lorre Obrien Chief Complaint/Reason for Consult: cholelithiasis  HPI:  Danielle Obrien is a 42 yo female with known cholelithiasis who presented to the ED this evening with worsening abdominal pain. She has been having pain in the RUQ for the last several weeks that has been slowly worsening. It gets worse with eating, and now occurs every time she eats. She has been unable to eat much recently due to the pain. She had an ultrasound on 7/22 that showed cholelithiasis without cholecystitis, and was referred to surgery, and had an appointment in our office this Friday. However she says the pain has now become unbearable for her. Labs today are unremarkable and WBC is normal.  She has a mechanical aortic valve placed in 2018 for severe aortic stenosis and is on Coumadin, which she last took this morning. Her last echo in March 2023 showed normal function. INR is pending. Prior abdominal surgeries include a hysterectomy and inguinal hernia repairs.   ROS: Review of Systems  Constitutional:  Negative for chills and fever.  Cardiovascular:  Negative for chest pain.  Gastrointestinal:  Positive for abdominal pain.  Musculoskeletal:  Positive for back pain.    Family History  Problem Relation Age of Onset   Hyperlipidemia Mother    Hypertension Mother    Thyroid disease Mother    Other Mother        Pituitary tumor   Hyperlipidemia Father    Hypertension Father    Migraines Father    Skin cancer Father    Hypertension Brother    Hyperlipidemia Brother    Heart attack Paternal Uncle    Thyroid disease Maternal Grandmother    Uterine cancer Maternal Grandmother 44   Dementia Maternal Grandmother    Thyroid disease Maternal Grandfather    Prostate cancer Maternal Grandfather 37   Dementia Paternal Grandmother    Heart attack Paternal Grandfather    Colon cancer Paternal Great-grandmother     Past Medical History:  Diagnosis  Date   ADD (attention deficit disorder)    Alcohol abuse, in remission 2012   per pt in remission since 2012   Anticoagulated on Coumadin    coumadin--- managed by cardiology/ coumadin clinic   Arthritis    In hips   Asthma    allergy induced, rare inhaler use   Auditory processing disorder    per pt very HOH, first language ASL   Bipolar disorder (HCC)    Chronic left hip pain    Chronic vertigo    Complication of anesthesia    per pt due to auditory processing disorder pt is unable to hear when waking up and also gets very anxious   Family history of uterine cancer    GAD (generalized anxiety disorder)    GERD (gastroesophageal reflux disease)    History of cervical dysplasia    History of condyloma acuminatum 2017   s/p laser ablation vulva   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   biopsy and CO2 laser ablation   HOH (hard of hearing)    per pt very HOH due to auditroy processing disorder   Hyperlipidemia    Hypertension    Muscle spasms of both lower extremities    both hips   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   23 mm [employer]in [employer] [place] bileaflet mechanical valve via right anterior mini thoracotomy   Vulvar dysplasia  Past Surgical History:  Procedure Laterality Date   ANTERIOR CRUCIATE LIGAMENT REPAIR  1993   AORTIC VALVE REPLACEMENT N/A 11/03/2017   Procedure: MINIMALLY INVASIVE AORTIC VALVE REPLACEMENT( MINI THORACOTOMY);  Surgeon: Danielle Nails, MD;  Location: [facility] OR;  Service: Open Heart Surgery;  Laterality: N/A;   CERVICAL BIOPSY  W/ LOOP ELECTRODE EXCISION  2010   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 ECC   COLPOSCOPY  06/30/2001   CIN I   COLPOSCOPY  08/30/2004   ECC--atypia   COLPOSCOPY N/A 01/21/2016   Procedure: COLPOSCOPY with vaginal biopsy with CO 2 Laser of Vaginal and vulvar condyloma;  Surgeon: Danielle Salles, MD;  Location: [facility] ORS;  Service: Gynecology;  Laterality: N/A;  Danielle Obrien 2/21 for 1115 case confirmed 01/16/15 - TS   HARDWARE REMOVAL Left 1992   Left hip   INGUINAL HERNIA REPAIR Bilateral    1981;  [id number]   LEFT AND RIGHT HEART CATHETERIZATION WITH CORONARY ANGIOGRAM N/A 05/18/2014   Procedure: LEFT AND RIGHT HEART CATHETERIZATION WITH CORONARY ANGIOGRAM;  Surgeon: Danielle Crafts, MD;  Location: [facility] CATH LAB;  Service: Cardiovascular;  Laterality: N/A;   LESION REMOVAL Right 05/12/2022   Procedure: Danielle Obrien OF RIGHT CHEEK CYST;  Surgeon: Danielle Napoleon, MD;  Location: [facility];  Service: Plastics;  Laterality: Right;   LYMPH NODE BIOPSY  1995   NASAL SEPTUM SURGERY  2002   ORIF HIP FRACTURE Left 1990   per pt and  took out growth plate in Right knee   ROBOTIC ASSISTED TOTAL HYSTERECTOMY  02/26/2009   @[facility]    TEE WITHOUT CARDIOVERSION N/A 11/03/2017   Procedure: TRANSESOPHAGEAL ECHOCARDIOGRAM (TEE);  Surgeon: Danielle Nails, MD;  Location: [facility] OR;  Service: Open Heart Surgery;  Laterality: N/A;   TONSILLECTOMY AND ADENOIDECTOMY  1990   TYMPANOSTOMY TUBE PLACEMENT Bilateral    x3  per pt last one 1982   VULVA /PERINEUM BIOPSY N/A 05/12/2022   Procedure: POSSIBLE VULVAR BIOPSY; POSSIBLE VAGINAL BIOPSY;  Surgeon: Danielle Fila, MD;  [facility];  Service: Gynecology;  Laterality: N/A;   VULVECTOMY N/A 05/12/2022   Procedure: WIDE EXCISION VULVECTOMY;  Surgeon: Danielle Fila, MD;  Location[facility];  Service: Gynecology;  Laterality: N/A;   VULVECTOMY N/A 08/12/2022   Procedure: WIDE LOCAL EXCISION VULVECTOMY;  Surgeon: Danielle Fila, MD;  Location: [facility];  Service: Gynecology;  Laterality: N/A;    Social History:  reports that she has been smoking cigarettes. She has a 5 pack-year smoking history. She has never used smokeless tobacco. She reports current drug use. Drug: Marijuana. She reports that she does not drink alcohol.  Allergies:  Allergies  Allergen Reactions   Demerol Nausea And Vomiting   Ibuprofen Other (See Comments)    Gastritis     (Not in a hospital admission)    Physical Exam: Blood pressure (!) 176/99, pulse 62, temperature 98.5 F (36.9 C), temperature source Oral, resp. rate 15, SpO2 100%. General: resting comfortably, appears stated age, no apparent distress Neurological: alert and oriented, no focal deficits, cranial  nerves grossly in tact HEENT: normocephalic, atraumatic, no scleral icterus CV: regular rate and rhythm Respiratory: normal work of breathing on room air, symmetric chest wall expansion Abdomen: soft, nondistended, focally tender to palpation in the RUQ. Well-healed port site surgical scars. Extremities: warm and well-perfused, no deformities, moving all extremities spontaneously Psychiatric: normal mood and affect Skin: warm and dry, no jaundice, no rashes or lesions   Results for orders placed or performed during the hospital encounter of 07/07/23 (from the past 48 hour(s))  Lipase, blood     Status: None   Collection Time: 07/07/23  4:13 PM  Result Value Ref Range   Lipase 28 11 - 51 U/L    Comment: Performed at [facility], [address] [place], [place] [address]  Comprehensive metabolic panel     Status: Abnormal   Collection Time: 07/07/23  4:13 PM  Result Value Ref Range   Sodium 136 135 - 145 mmol/L   Potassium 3.7 3.5 - 5.1 mmol/L   Chloride 106 98 - 111 mmol/L   CO2 21 (L) 22 - 32 mmol/L   Glucose, Bld 110 (H) 70 - 99 mg/dL    Comment: Glucose reference range applies only to samples taken after fasting for at least 8 hours.   BUN <5 (L) 6 - 20  mg/dL   Creatinine, Ser 3.32 0.44 - 1.00 mg/dL   Calcium 8.7 (L) 8.9 - 10.3 mg/dL   Total Protein 6.9 6.5 - 8.1 g/dL   Albumin 4.1 3.5 - 5.0 g/dL   AST 24 15 - 41 U/L   ALT 14 0 - 44 U/L   Alkaline Phosphatase 65 38 - 126 U/L   Total Bilirubin 0.6 0.3 - 1.2 mg/dL   GFR, Estimated >95 >18 mL/min    Comment: (NOTE) Calculated using the CKD-EPI Creatinine Equation (2021)    Anion gap 9 5 - 15    Comment: Performed at [facility], [address], [place], [place] [address]  CBC     Status: Abnormal   Collection Time: 07/07/23  4:13 PM  Result Value Ref Range   WBC 5.0 4.0 - 10.5 K/uL   RBC 4.75 3.87 - 5.11 MIL/uL   Hemoglobin 14.6 12.0 - 15.0 g/dL   HCT 06.3 01.6 - 01.0 %   MCV 90.9 80.0 - 100.0 fL   MCH 30.7 26.0 - 34.0 pg   MCHC 33.8 30.0 - 36.0 g/dL   RDW 93.2 35.5 - 73.2 %   Platelets 103 (L) 150 - 400 K/uL    Comment: REPEATED TO VERIFY   nRBC 0.0 0.0 - 0.2 %    Comment: Performed at [facility], [address], [place], [place] [address]   *Note: Due to a large number of results and/or encounters for the requested time period, some results have not been displayed. A complete set of results can be found in Results Review.   US Abdomen Limited  Result Date: 07/07/2023 CLINICAL DATA:  Right upper quadrant abdominal pain EXAM: ULTRASOUND ABDOMEN LIMITED RIGHT UPPER QUADRANT COMPARISON:  None Available. FINDINGS: Gallbladder: Multiple layering gallstones are seen within the gallbladder. The gallbladder is mildly distended. The sonographic Eulah Pont sign is reportedly POSITIVE. However, the gallbladder wall is not thickened and no pericholecystic fluid is seen. Common bile duct: Diameter: 3 mm in proximal diameter Liver: No focal lesion identified. Within normal limits in parenchymal echogenicity. Portal vein is patent on color Doppler imaging with normal direction of blood flow towards  the liver. Other: None. IMPRESSION: 1. Cholelithiasis with mild gallbladder distention and  a reportedly POSITIVE sonographic Murphy sign. Together, the findings may reflect early changes of acute cholecystitis. Correlation with liver enzymes and possible hepatobiliary scintigraphy may be helpful for further evaluation. Electronically Signed   By: Helyn Numbers M.D.   On: 07/07/2023 20:11      Assessment/Plan 42 yo female with severe symptomatic cholelithiasis. She has had progressive pain for the last few weeks that now limits oral intake. Repeat US today shows large stones but no pericholecystic fluid or wall thickening to suggest acute cholecystitis, and her WBC is normal. However given the severity of her symptoms I discussed admission for cholecystectomy. As she is not able to eat she prefers to proceed with surgery rather than waiting for outpatient elective cholecystectomy. - Clear liquid diet - No need for antibiotics unless patient develops fevers or leukocytosis - Ok for heparin gtt, please hold Coumadin. Obrien need to wait for INR to normalize prior to proceeding with surgery. I discussed with the patient that this Obrien likely take several days. - Patient is to be admitted to hospital for management of medical comorbidities. Surgery Obrien follow.   Sophronia Simas, MD [facility] Surgery General, Hepatobiliary and Pancreatic Surgery 07/07/23 8:26 PM

## 2023-07-07 NOTE — ED Triage Notes (Signed)
Pt is coming in for gallstones that was Dx 3 weeks ago, is set up to see a surgeon but cannot take the pain, the appt was supposed to be this Friday. Pt also expresses feeling nauseated, no bowel movements due to lack of appetite because of the pain attributed to eating.

## 2023-07-08 DIAGNOSIS — K8000 Calculus of gallbladder with acute cholecystitis without obstruction: Secondary | ICD-10-CM | POA: Diagnosis not present

## 2023-07-08 MED ORDER — HYDROXYZINE HCL 25 MG PO TABS
25.0000 mg | ORAL_TABLET | Freq: Four times a day (QID) | ORAL | Status: AC | PRN
  Administered 2023-07-08 – 2023-07-09 (×2): 25 mg via ORAL
  Filled 2023-07-08 (×2): qty 1

## 2023-07-08 MED ORDER — HYDROMORPHONE HCL 1 MG/ML IJ SOLN
0.5000 mg | Freq: Once | INTRAMUSCULAR | Status: AC
  Administered 2023-07-08: 0.5 mg via INTRAVENOUS
  Filled 2023-07-08: qty 0.5

## 2023-07-08 MED ORDER — NICOTINE 14 MG/24HR TD PT24: 14.0000 mg | MEDICATED_PATCH | Freq: Every day | TRANSDERMAL | Status: DC

## 2023-07-08 MED ORDER — NICOTINE 14 MG/24HR TD PT24
14.0000 mg | MEDICATED_PATCH | Freq: Every day | TRANSDERMAL | Status: DC
  Administered 2023-07-08 – 2023-07-15 (×7): 14 mg via TRANSDERMAL
  Filled 2023-07-08 (×8): qty 1

## 2023-07-08 MED ORDER — HYDROXYZINE HCL 25 MG PO TABS
25.0000 mg | ORAL_TABLET | Freq: Four times a day (QID) | ORAL | Status: AC | PRN
  Administered 2023-07-08 (×2): 25 mg via ORAL
  Filled 2023-07-08 (×2): qty 1

## 2023-07-08 NOTE — Progress Notes (Signed)
PROGRESS NOTE    Danielle Obrien  [name]:[id number] DOB: Feb 19, 1981 DOA: 07/07/2023 PCP: Etta Grandchild, MD   Brief Narrative:   42 y.o. female with medical history significant of essential hypertension, GERD, hyperlipidemia, ADD, asthma, history of aortic stenosis status post minimally invasive aortic valve replacement with bileaflet mechanical valve currently on chronic anticoagulation with warfarin, recent diagnosis of cholelithiasis on 06/25/2023 with outpatient general surgery evaluations and presented with worsening abdominal pain with vomiting.  General surgery was consulted and recommended admission for possible lap cholecystectomy.  On presentation, INR was 3.2: Patient was admitted under [facility] service.  Assessment & Plan:   Biliary colic in a patient with recent diagnosis of cholelithiasis. -General Surgery following and planning for surgical intervention once INR improves. -Still complains of intermittent severe pain.  Continue current pain management and antiemetics as needed.  History of aortic stenosis status post minimally invasive aortic valve replacement with bileaflet mechanical valve currently on chronic anticoagulation with warfarin Supratherapeutic INR -INR still 4.0 today.  Hold Coumadin.  Start heparin drip once INR is around 2. -Outpatient follow-up with cardiology  Essential hypertension -Blood pressure intermittently elevated.  Monitor.  Continue metoprolol  Hyperlipidemia Will continue statin  Bipolar disorder -Continue duloxetine  Mild persistent asthma -Currently stable.  Continue as needed albuterol  Tobacco abuse -Counseled by admitting hospitalist regarding cessation.  Continue nicotine patch.   DVT prophylaxis: Coumadin on hold because of supratherapeutic INR Code Status: Full Family Communication: None at bedside Disposition Plan: Status is: Inpatient Remains inpatient appropriate because: Of severity of illness    Consultants: General  surgery  Procedures: None  Antimicrobials: None   Subjective: Patient seen and examined at bedside.  Complains of intermittent abdominal pain with nausea.  No fever, chest pain reported  Objective: Vitals:   07/07/23 2100 07/07/23 2345 07/08/23 0501 07/08/23 0837  BP: (!) 150/104 (!) 156/97 118/66 (!) 126/93  Pulse: 60 (!) 57 (!) 57 64  Resp:    18  Temp: 98.3 F (36.8 C) 97.8 F (36.6 C) 97.8 F (36.6 C) 98 F (36.7 C)  TempSrc: Oral Oral Oral Oral  SpO2: 100% 99% 97% 98%    Intake/Output Summary (Last 24 hours) at 07/08/2023 1033 Last data filed at 07/08/2023 0500 Gross per 24 hour  Intake 1536.08 ml  Output --  Net 1536.08 ml   There were no vitals filed for this visit.  Examination:  General exam: Appears calm and comfortable.  On room air. Respiratory system: Bilateral decreased breath sounds at bases Cardiovascular system: S1 & S2 heard, mild intermittent bradycardia present gastrointestinal system: Abdomen is nondistended, soft and tender in the right upper quadrant; normal bowel sounds heard. Extremities: No cyanosis, clubbing, edema  Central nervous system: Alert and oriented. No focal neurological deficits. Moving extremities Skin: No rashes, lesions or ulcers Psychiatry: Slightly anxious.  Not agitated patient   Data Reviewed: I have personally reviewed following labs and imaging studies  CBC: Recent Labs  Lab 07/07/23 1613 07/08/23 0011  WBC 5.0 4.6  HGB 14.6 14.7  HCT 43.2 43.9  MCV 90.9 93.6  PLT 103* 98*   Basic Metabolic Panel: Recent Labs  Lab 07/07/23 1613 07/08/23 0011  NA 136 139  K 3.7 3.8  CL 106 104  CO2 21* 28  GLUCOSE 110* 105*  BUN <5* <5*  CREATININE 0.99 0.89  CALCIUM 8.7* 8.7*   GFR: CrCl cannot be calculated (Unknown ideal weight.). Liver Function Tests: Recent Labs  Lab 07/07/23 1613 07/08/23 0011  AST 24 21  ALT 14 14  ALKPHOS 65 63  BILITOT 0.6 0.5  PROT 6.9 6.6  ALBUMIN 4.1 3.9   Recent Labs  Lab  07/07/23 1613  LIPASE 28   No results for input(s): "AMMONIA" in the last 168 hours. Coagulation Profile: Recent Labs  Lab 07/07/23 2023 07/08/23 0011  INR 3.9* 4.2*   Cardiac Enzymes: No results for input(s): "CKTOTAL", "CKMB", "CKMBINDEX", "TROPONINI" in the last 168 hours. BNP (last 3 results) No results for input(s): "PROBNP" in the last 8760 hours. HbA1C: No results for input(s): "HGBA1C" in the last 72 hours. CBG: No results for input(s): "GLUCAP" in the last 168 hours. Lipid Profile: No results for input(s): "CHOL", "HDL", "LDLCALC", "TRIG", "CHOLHDL", "LDLDIRECT" in the last 72 hours. Thyroid Function Tests: No results for input(s): "TSH", "T4TOTAL", "FREET4", "T3FREE", "THYROIDAB" in the last 72 hours. Anemia Panel: No results for input(s): "VITAMINB12", "FOLATE", "FERRITIN", "TIBC", "IRON", "RETICCTPCT" in the last 72 hours. Sepsis Labs: No results for input(s): "PROCALCITON", "LATICACIDVEN" in the last 168 hours.  No results found for this or any previous visit (from the past 240 hour(s)).       Radiology Studies: US Abdomen Limited  Result Date: 07/07/2023 CLINICAL DATA:  Right upper quadrant abdominal pain EXAM: ULTRASOUND ABDOMEN LIMITED RIGHT UPPER QUADRANT COMPARISON:  None Available. FINDINGS: Gallbladder: Multiple layering gallstones are seen within the gallbladder. The gallbladder is mildly distended. The sonographic Eulah Pont sign is reportedly POSITIVE. However, the gallbladder wall is not thickened and no pericholecystic fluid is seen. Common bile duct: Diameter: 3 mm in proximal diameter Liver: No focal lesion identified. Within normal limits in parenchymal echogenicity. Portal vein is patent on color Doppler imaging with normal direction of blood flow towards the liver. Other: None. IMPRESSION: 1. Cholelithiasis with mild gallbladder distention and a reportedly POSITIVE sonographic Murphy sign. Together, the findings may reflect early changes of acute  cholecystitis. Correlation with liver enzymes and possible hepatobiliary scintigraphy may be helpful for further evaluation. Electronically Signed   By: Helyn Numbers M.D.   On: 07/07/2023 20:11        Scheduled Meds:  DULoxetine  40 mg Oral Daily   gabapentin  600 mg Oral TID   lamoTRIgine  400 mg Oral Daily   metoprolol tartrate  12.5 mg Oral Daily   nicotine  14 mg Transdermal Daily   pantoprazole  40 mg Oral Daily   rosuvastatin  20 mg Oral Daily   tiZANidine  4 mg Oral QHS   Continuous Infusions:  lactated ringers Stopped (07/07/23 2037)   lactated ringers 75 mL/hr at 07/07/23 2119          Glade Lloyd, MD [facility] 07/08/2023, 10:33 AM

## 2023-07-08 NOTE — Plan of Care (Signed)
  Problem: Education: Goal: Knowledge of General Education information will improve Description: Including pain rating scale, medication(s)/side effects and non-pharmacologic comfort measures Outcome: Progressing   Problem: Clinical Measurements: Goal: Ability to maintain clinical measurements within normal limits will improve Outcome: Progressing Goal: Cardiovascular complication will be avoided Outcome: Progressing   Problem: Activity: Goal: Risk for activity intolerance will decrease Outcome: Progressing   Problem: Nutrition: Goal: Adequate nutrition will be maintained Outcome: Progressing   Problem: Pain Managment: Goal: General experience of comfort will improve Outcome: Progressing   Problem: Safety: Goal: Ability to remain free from injury will improve Outcome: Progressing   Problem: Skin Integrity: Goal: Risk for impaired skin integrity will decrease Outcome: Progressing

## 2023-07-08 NOTE — Progress Notes (Signed)
Pt reporting and appears anxious. Pt reports they don't take anything for anxiety at home but their anxiety is much greater now than it usually ever is. Pt expresses sentiments of feeling overwhelmed with frequent surgical procedures, stating "this will be my 4th surgery in 5 months," inability to eat without discomfort and uncontrolled pain. Provider notified and PRNs ordered/ given. Nicotine patch provided as well.

## 2023-07-08 NOTE — Progress Notes (Signed)
Pt admitted to the unit A&Ox4, BP elevated, reporting 10/10 pain in abdomen radiating to right flank. Denies SOB/dizziness. Ambulates independently. Pt reports last BM was PTA appox a week ago due to decreased oral intake related to pain in abdomen when eating. Pt expresses some mild anxiety regarding being in the hospital related to having over 20 surgeries thus far and minimal pain relief despite efforts. Pt is SB on telemetry and continuous IV fluids were initiated.

## 2023-07-08 NOTE — Progress Notes (Signed)
Subjective: Says dilaudid helps her pain, but it is starting to come back.  No nausea right now.    ROS: See above, otherwise other systems negative  Objective: Vital signs in last 24 hours: Temp:  [97.8 F (36.6 C)-98.5 F (36.9 C)] 98 F (36.7 C) (08/08 0837) Pulse Rate:  [57-72] 64 (08/08 0837) Resp:  [14-18] 18 (08/08 0837) BP: (118-176)/(66-104) 126/93 (08/08 0837) SpO2:  [96 %-100 %] 98 % (08/08 0837) Last BM Date :  (PTA pt states appox a week because they haven't eaten much due to pain)  Intake/Output from previous day: 08/07 0701 - 08/08 0700 In: 1536.1 [P.O.:714; I.V.:822.1] Out: -  Intake/Output this shift: No intake/output data recorded.  PE: Gen: NAD, a bit anxious Respiratory effort - unlabored on RA Abd: soft, tender in RUQ and swipes my hand away, ND, +BS  Lab Results:  Recent Labs    07/07/23 1613 07/08/23 0011  WBC 5.0 4.6  HGB 14.6 14.7  HCT 43.2 43.9  PLT 103* 98*   BMET Recent Labs    07/07/23 1613 07/08/23 0011  NA 136 139  K 3.7 3.8  CL 106 104  CO2 21* 28  GLUCOSE 110* 105*  BUN <5* <5*  CREATININE 0.99 0.89  CALCIUM 8.7* 8.7*   PT/INR Recent Labs    07/07/23 2023 07/08/23 0011  LABPROT 38.1* 40.9*  INR 3.9* 4.2*   CMP     Component Value Date/Time   NA 139 07/08/2023 0011   NA 144 11/24/2017 1218   K 3.8 07/08/2023 0011   CL 104 07/08/2023 0011   CO2 28 07/08/2023 0011   GLUCOSE 105 (H) 07/08/2023 0011   BUN <5 (L) 07/08/2023 0011   BUN 15 11/24/2017 1218   CREATININE 0.89 07/08/2023 0011   CALCIUM 8.7 (L) 07/08/2023 0011   PROT 6.6 07/08/2023 0011   ALBUMIN 3.9 07/08/2023 0011   AST 21 07/08/2023 0011   ALT 14 07/08/2023 0011   ALKPHOS 63 07/08/2023 0011   BILITOT 0.5 07/08/2023 0011   GFRNONAA >60 07/08/2023 0011   GFRAA >60 12/11/2018 0242   Lipase     Component Value Date/Time   LIPASE 28 07/07/2023 1613       Studies/Results: US Abdomen Limited  Result Date: 07/07/2023 CLINICAL DATA:   Right upper quadrant abdominal pain EXAM: ULTRASOUND ABDOMEN LIMITED RIGHT UPPER QUADRANT COMPARISON:  None Available. FINDINGS: Gallbladder: Multiple layering gallstones are seen within the gallbladder. The gallbladder is mildly distended. The sonographic Eulah Pont sign is reportedly POSITIVE. However, the gallbladder wall is not thickened and no pericholecystic fluid is seen. Common bile duct: Diameter: 3 mm in proximal diameter Liver: No focal lesion identified. Within normal limits in parenchymal echogenicity. Portal vein is patent on color Doppler imaging with normal direction of blood flow towards the liver. Other: None. IMPRESSION: 1. Cholelithiasis with mild gallbladder distention and a reportedly POSITIVE sonographic Murphy sign. Together, the findings may reflect early changes of acute cholecystitis. Correlation with liver enzymes and possible hepatobiliary scintigraphy may be helpful for further evaluation. Electronically Signed   By: Helyn Numbers M.D.   On: 07/07/2023 20:11    Anti-infectives: Anti-infectives (From admission, onward)    None        Assessment/Plan Biliary colic -cont to await INR to downtrend.  May start heparin gtt when INR at 2 or whenever heparin is needed. -cont diet as able. -pain seems a bit out of proportion to objective findings. -no abx needed  at this time -will follow for readiness for lap chole.   FEN - HH diet VTE - coumadin on hold.  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   LOS: 1 day    Letha Cape , [facility] Surgery 07/08/2023, 8:52 AM Please see Amion for pager number during day hours 7:00am-4:30pm or 7:00am  -11:30am on weekends

## 2023-07-08 NOTE — Plan of Care (Signed)

## 2023-07-09 DIAGNOSIS — K8050 Calculus of bile duct without cholangitis or cholecystitis without obstruction: Secondary | ICD-10-CM | POA: Insufficient documentation

## 2023-07-09 MED ORDER — HYDROXYZINE HCL 25 MG PO TABS
25.0000 mg | ORAL_TABLET | Freq: Four times a day (QID) | ORAL | Status: AC | PRN
  Administered 2023-07-09 – 2023-07-10 (×3): 25 mg via ORAL
  Filled 2023-07-09 (×3): qty 1

## 2023-07-09 MED ORDER — OXYCODONE HCL 5 MG PO TABS
5.0000 mg | ORAL_TABLET | ORAL | Status: DC | PRN
  Administered 2023-07-09 – 2023-07-15 (×26): 10 mg via ORAL
  Filled 2023-07-09 (×26): qty 2

## 2023-07-09 NOTE — Progress Notes (Signed)
Subjective: Still with pain.  Tolerating HH diet.    ROS: See above, otherwise other systems negative  Objective: Vital signs in last 24 hours: Temp:  [97.8 F (36.6 C)-98.1 F (36.7 C)] 98.1 F (36.7 C) (08/09 0831) Pulse Rate:  [56-62] 56 (08/09 0831) Resp:  [16-18] 16 (08/09 0831) BP: (115-169)/(78-91) 169/84 (08/09 0831) SpO2:  [98 %-99 %] 98 % (08/09 0831) Last BM Date : 07/08/23  Intake/Output from previous day: No intake/output data recorded. Intake/Output this shift: Total I/O In: 2066.7 [I.V.:2066.7] Out: -   PE: Gen: NAD Respiratory effort - unlabored on RA Abd: soft, tender in RUQ, ND, +BS  Lab Results:  Recent Labs    07/08/23 0011 07/09/23 0042  WBC 4.6 4.1  HGB 14.7 13.8  HCT 43.9 41.2  PLT 98* 93*   BMET Recent Labs    07/08/23 0011 07/09/23 0042  NA 139 141  K 3.8 4.6  CL 104 107  CO2 28 27  GLUCOSE 105* 88  BUN <5* 5*  CREATININE 0.89 0.85  CALCIUM 8.7* 8.7*   PT/INR Recent Labs    07/08/23 0011 07/09/23 0042  LABPROT 40.9* 32.8*  INR 4.2* 3.2*   CMP     Component Value Date/Time   NA 141 07/09/2023 0042   NA 144 11/24/2017 1218   K 4.6 07/09/2023 0042   CL 107 07/09/2023 0042   CO2 27 07/09/2023 0042   GLUCOSE 88 07/09/2023 0042   BUN 5 (L) 07/09/2023 0042   BUN 15 11/24/2017 1218   CREATININE 0.85 07/09/2023 0042   CALCIUM 8.7 (L) 07/09/2023 0042   PROT 5.9 (L) 07/09/2023 0042   ALBUMIN 3.5 07/09/2023 0042   AST 19 07/09/2023 0042   ALT 13 07/09/2023 0042   ALKPHOS 60 07/09/2023 0042   BILITOT 0.4 07/09/2023 0042   GFRNONAA >60 07/09/2023 0042   GFRAA >60 12/11/2018 0242   Lipase     Component Value Date/Time   LIPASE 28 07/07/2023 1613       Studies/Results: US Abdomen Limited  Result Date: 07/07/2023 CLINICAL DATA:  Right upper quadrant abdominal pain EXAM: ULTRASOUND ABDOMEN LIMITED RIGHT UPPER QUADRANT COMPARISON:  None Available. FINDINGS: Gallbladder: Multiple layering gallstones are seen  within the gallbladder. The gallbladder is mildly distended. The sonographic Eulah Pont sign is reportedly POSITIVE. However, the gallbladder wall is not thickened and no pericholecystic fluid is seen. Common bile duct: Diameter: 3 mm in proximal diameter Liver: No focal lesion identified. Within normal limits in parenchymal echogenicity. Portal vein is patent on color Doppler imaging with normal direction of blood flow towards the liver. Other: None. IMPRESSION: 1. Cholelithiasis with mild gallbladder distention and a reportedly POSITIVE sonographic Murphy sign. Together, the findings may reflect early changes of acute cholecystitis. Correlation with liver enzymes and possible hepatobiliary scintigraphy may be helpful for further evaluation. Electronically Signed   By: Helyn Numbers M.D.   On: 07/07/2023 20:11    Anti-infectives: Anti-infectives (From admission, onward)    None        Assessment/Plan Biliary colic -cont to await INR to downtrend.  May start heparin gtt when INR at 2 or whenever heparin is needed. -cont diet as able, but NPO p MN incase INR has normalized by tomorrow am -no abx needed at this time -will follow for readiness for lap chole.   FEN - HH diet, NPO p MN VTE - coumadin on hold.  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   LOS: 2 days    Letha Cape , [facility] 07/09/2023, 10:50 AM Please see Amion for pager number during day hours 7:00am-4:30pm or 7:00am -11:30am on weekends

## 2023-07-09 NOTE — Progress Notes (Signed)
PROGRESS NOTE    Danielle Obrien  RUE:[id number] DOB: [birth year] DOA: 07/07/2023 PCP: Etta Grandchild, MD   Brief Narrative:   42 y.o. female with medical history significant of essential hypertension, GERD, hyperlipidemia, ADD, asthma, history of aortic stenosis status post minimally invasive aortic valve replacement with bileaflet mechanical valve currently on chronic anticoagulation with warfarin, recent diagnosis of cholelithiasis on 06/25/2023 with outpatient general surgery evaluations and presented with worsening abdominal pain with vomiting.  General surgery was consulted and recommended admission for possible lap cholecystectomy.  On presentation, INR was 3.2: Patient was admitted under [facility] service.  Assessment & Plan:   Biliary colic in a patient with recent diagnosis of cholelithiasis. -General Surgery following and planning for surgical intervention once INR improves. -Continue current pain management and antiemetics as needed.  History of aortic stenosis status post minimally invasive aortic valve replacement with bileaflet mechanical valve currently on chronic anticoagulation with warfarin Supratherapeutic INR -INR 3.2 today.  Coumadin on hold.  Start heparin drip once INR is around 2. -Outpatient follow-up with cardiology  Thrombocytopenia -Questionable cause.  Monitor.  No signs bleeding.  Essential hypertension -Blood pressure intermittently elevated.  Monitor.  Continue metoprolol  Hyperlipidemia -continue statin  Bipolar disorder -Continue duloxetine  Mild persistent asthma -Currently stable.  Continue as needed albuterol  Tobacco abuse -Counseled by admitting hospitalist regarding cessation.  Continue nicotine patch.   DVT prophylaxis: Coumadin on hold because of supratherapeutic INR Code Status: Full Family Communication: None at bedside Disposition Plan: Status is: Inpatient Remains inpatient appropriate because: Of severity of  illness    Consultants: General surgery  Procedures: None  Antimicrobials: None   Subjective: Patient seen and examined at bedside.  No shortness of breath, fever, chest pain reported.  Still complains of intermittent abdominal pain. Objective: Vitals:   07/08/23 0837 07/08/23 1651 07/08/23 2127 07/09/23 0414  BP: (!) 126/93 115/78 118/86 (!) 149/91  Pulse: 64 62 60 60  Resp: 18 17 18 17   Temp: 98 F (36.7 C)  97.8 F (36.6 C) 97.8 F (36.6 C)  TempSrc: Oral  Oral Oral  SpO2: 98% 99% 98% 99%   No intake or output data in the 24 hours ending 07/09/23 0743  There were no vitals filed for this visit.  Examination:  General: Currently on room air.  No distress ENT/neck: No thyromegaly.  JVD is not elevated  respiratory: Decreased breath sounds at bases bilaterally with some crackles; no wheezing  CVS: S1-S2 heard, rate controlled currently Abdominal: Soft, right upper quadrant tenderness present; slightly distended; no organomegaly, bowel sounds are heard Extremities: No edema; no cyanosis  CNS: Awake and alert.  No focal neurologic deficit.  Moves extremities Lymph: No obvious lymphadenopathy Skin: No obvious ecchymosis/lesions  psych: Not agitated currently.  Looks slightly anxious  musculoskeletal: No obvious joint swelling/deformity    Data Reviewed: I have personally reviewed following labs and imaging studies  CBC: Recent Labs  Lab 07/07/23 1613 07/08/23 0011 07/09/23 0042  WBC 5.0 4.6 4.1  HGB 14.6 14.7 13.8  HCT 43.2 43.9 41.2  MCV 90.9 93.6 93.6  PLT 103* 98* 93*   Basic Metabolic Panel: Recent Labs  Lab 07/07/23 1613 07/08/23 0011 07/09/23 0042  NA 136 139 141  K 3.7 3.8 4.6  CL 106 104 107  CO2 21* 28 27  GLUCOSE 110* 105* 88  BUN <5* <5* 5*  CREATININE 0.99 0.89 0.85  CALCIUM 8.7* 8.7* 8.7*  MG  --   --  2.1  GFR: CrCl cannot be calculated (Unknown ideal weight.). Liver Function Tests: Recent Labs  Lab 07/07/23 1613  07/08/23 0011 07/09/23 0042  AST 24 21 19   ALT 14 14 13   ALKPHOS 65 63 60  BILITOT 0.6 0.5 0.4  PROT 6.9 6.6 5.9*  ALBUMIN 4.1 3.9 3.5   Recent Labs  Lab 07/07/23 1613  LIPASE 28   No results for input(s): "AMMONIA" in the last 168 hours. Coagulation Profile: Recent Labs  Lab 07/07/23 2023 07/08/23 0011 07/09/23 0042  INR 3.9* 4.2* 3.2*   Cardiac Enzymes: No results for input(s): "CKTOTAL", "CKMB", "CKMBINDEX", "TROPONINI" in the last 168 hours. BNP (last 3 results) No results for input(s): "PROBNP" in the last 8760 hours. HbA1C: No results for input(s): "HGBA1C" in the last 72 hours. CBG: No results for input(s): "GLUCAP" in the last 168 hours. Lipid Profile: No results for input(s): "CHOL", "HDL", "LDLCALC", "TRIG", "CHOLHDL", "LDLDIRECT" in the last 72 hours. Thyroid Function Tests: No results for input(s): "TSH", "T4TOTAL", "FREET4", "T3FREE", "THYROIDAB" in the last 72 hours. Anemia Panel: No results for input(s): "VITAMINB12", "FOLATE", "FERRITIN", "TIBC", "IRON", "RETICCTPCT" in the last 72 hours. Sepsis Labs: No results for input(s): "PROCALCITON", "LATICACIDVEN" in the last 168 hours.  No results found for this or any previous visit (from the past 240 hour(s)).       Radiology Studies: US Abdomen Limited  Result Date: 07/07/2023 CLINICAL DATA:  Right upper quadrant abdominal pain EXAM: ULTRASOUND ABDOMEN LIMITED RIGHT UPPER QUADRANT COMPARISON:  None Available. FINDINGS: Gallbladder: Multiple layering gallstones are seen within the gallbladder. The gallbladder is mildly distended. The sonographic Eulah Pont sign is reportedly POSITIVE. However, the gallbladder wall is not thickened and no pericholecystic fluid is seen. Common bile duct: Diameter: 3 mm in proximal diameter Liver: No focal lesion identified. Within normal limits in parenchymal echogenicity. Portal vein is patent on color Doppler imaging with normal direction of blood flow towards the liver. Other:  None. IMPRESSION: 1. Cholelithiasis with mild gallbladder distention and a reportedly POSITIVE sonographic Murphy sign. Together, the findings may reflect early changes of acute cholecystitis. Correlation with liver enzymes and possible hepatobiliary scintigraphy may be helpful for further evaluation. Electronically Signed   By: Helyn Numbers M.D.   On: 07/07/2023 20:11        Scheduled Meds:  DULoxetine  40 mg Oral Daily   gabapentin  600 mg Oral TID   lamoTRIgine  400 mg Oral Daily   metoprolol tartrate  12.5 mg Oral Daily   nicotine  14 mg Transdermal Daily   pantoprazole  40 mg Oral Daily   rosuvastatin  20 mg Oral Daily   tiZANidine  4 mg Oral QHS   Continuous Infusions:  lactated ringers 75 mL/hr at 07/09/23 0032          Glade Lloyd, MD [facility] 07/09/2023, 7:43 AM

## 2023-07-09 NOTE — Plan of Care (Signed)

## 2023-07-09 NOTE — Plan of Care (Signed)
  Problem: Pain Managment: Goal: General experience of comfort will improve Outcome: Progressing   Problem: Nutrition: Goal: Adequate nutrition will be maintained Outcome: Progressing   Problem: Education: Goal: Knowledge of General Education information will improve Description: Including pain rating scale, medication(s)/side effects and non-pharmacologic comfort measures Outcome: Progressing

## 2023-07-10 DIAGNOSIS — K8000 Calculus of gallbladder with acute cholecystitis without obstruction: Secondary | ICD-10-CM | POA: Diagnosis not present

## 2023-07-10 LAB — HEPARIN LEVEL (UNFRACTIONATED)
Heparin Unfractionated: 0.16 IU/mL — ABNORMAL LOW (ref 0.30–0.70)
Heparin Unfractionated: 0.29 IU/mL — ABNORMAL LOW (ref 0.30–0.70)

## 2023-07-10 MED ORDER — CEFAZOLIN SODIUM-DEXTROSE 2-4 GM/100ML-% IV SOLN
2.0000 g | Freq: Once | INTRAVENOUS | Status: AC
  Administered 2023-07-11: 2 g via INTRAVENOUS
  Filled 2023-07-10: qty 100

## 2023-07-10 MED ORDER — HEPARIN (PORCINE) 25000 UT/250ML-% IV SOLN
1000.0000 [IU]/h | INTRAVENOUS | Status: AC
  Administered 2023-07-10: 750 [IU]/h via INTRAVENOUS
  Filled 2023-07-10: qty 250

## 2023-07-10 NOTE — Progress Notes (Signed)
Pharmacy Consult for heparin gtt  Indication:  mAVR  Allergies  Allergen Reactions   Demerol Nausea And Vomiting   Ibuprofen Other (See Comments)    Gastritis     Patient Measurements:   Heparin Dosing Weight:  52.4 kg (per wt 06/14/23)   Vital Signs: Temp: 97.6 F (36.4 C) (08/10 0736) Temp Source: Oral (08/10 0736) BP: 138/87 (08/10 0736) Pulse Rate: 65 (08/10 0736)  Labs: Recent Labs    07/07/23 1613 07/07/23 2023 07/08/23 0011 07/09/23 0042 07/10/23 0141 07/10/23 1431  HGB 14.6  --  14.7 13.8  --   --   HCT 43.2  --  43.9 41.2  --   --   PLT 103*  --  98* 93*  --   --   LABPROT  --    < > 40.9* 32.8* 17.1*  --   INR  --    < > 4.2* 3.2* 1.4*  --   HEPARINUNFRC  --   --   --   --   --  0.16*  CREATININE 0.99  --  0.89 0.85  --   --    < > = values in this interval not displayed.    CrCl cannot be calculated (Unknown ideal weight.).  Assessment: Danielle Obrien a 42 y.o. female presented with biliary colic, on warfarin PTA- currently on hold for anticipated procedure. Pharmacy has been consulted for heparin dosing.   8/10 AM: INR subtherapeutic at 1.4. Last dose of warfarin reportedly 8/7 AM. Hgb has been WNL, PLTs in 90s.   Anticoagulation PTA: warfarin 7.5 mg Sun, MWF, 11.25 mg TTS  8/10 PM: heparin level is subtherapeutic at 0.16 on 750 units/hr although drawn ~4h after drip started. No bleeding or infusion issues per RN.  Goal of Therapy:  INR 2-3 Heparin level 0.3-0.7 units/ml Monitor platelets by anticoagulation protocol: Yes   Plan:  Increase heparin infusion to 900 units/hour Check heparin level in 6 hours Per [facility], plan to hold heparin infusion 8/11 0300 prior to procedure    Thank you for involving pharmacy in this patient's care.  Loura Back, PharmD, BCPS Clinical Pharmacist Clinical phone for 07/10/2023 is [phone] 07/10/2023 3:46 PM

## 2023-07-10 NOTE — Progress Notes (Signed)
PROGRESS NOTE    Danielle Obrien  ZOX:[id number] DOB: March 08, 1981 DOA: 07/07/2023 PCP: Etta Grandchild, MD   Brief Narrative:   42 y.o. female with medical history significant of essential hypertension, GERD, hyperlipidemia, ADD, asthma, history of aortic stenosis status post minimally invasive aortic valve replacement with bileaflet mechanical valve currently on chronic anticoagulation with warfarin, recent diagnosis of cholelithiasis on 06/25/2023 with outpatient general surgery evaluations and presented with worsening abdominal pain with vomiting.  General surgery was consulted and recommended admission for possible lap cholecystectomy.  On presentation, INR was 3.2: Patient was admitted under [facility] service.  Assessment & Plan:   Biliary colic in a patient with recent diagnosis of cholelithiasis. -General Surgery following and planning for surgical intervention, possibly tomorrow -Continue current pain management and antiemetics as needed.  History of aortic stenosis status post minimally invasive aortic valve replacement with bileaflet mechanical valve currently on chronic anticoagulation with warfarin Supratherapeutic INR -INR 1.4 today.  Coumadin on hold.  Start heparin drip today -Outpatient follow-up with cardiology  Thrombocytopenia -Questionable cause.  No labs today.  No signs bleeding.  Essential hypertension -Blood pressure intermittently elevated.  Monitor.  Continue metoprolol  Hyperlipidemia -continue statin  Bipolar disorder -Continue duloxetine  Mild persistent asthma -Currently stable.  Continue as needed albuterol  Tobacco abuse -Counseled by admitting hospitalist regarding cessation.  Continue nicotine patch.   DVT prophylaxis: Coumadin on hold because of supratherapeutic INR Code Status: Full Family Communication: None at bedside Disposition Plan: Status is: Inpatient Remains inpatient appropriate because: Of severity of illness    Consultants:  General surgery  Procedures: None  Antimicrobials: None   Subjective: Patient seen and examined at bedside.  Still has intermittent abdominal pain.  No fever, chest pain or shortness of breath reported.   Objective: Vitals:   07/09/23 1941 07/09/23 2113 07/10/23 0450 07/10/23 0736  BP: (!) 179/95 (!) 161/97 (!) 143/100 138/87  Pulse: 64 63 67 65  Resp: 20     Temp: (!) 97.5 F (36.4 C)  98.3 F (36.8 C) 97.6 F (36.4 C)  TempSrc: Oral   Oral  SpO2: 100%  96% 98%    Intake/Output Summary (Last 24 hours) at 07/10/2023 0813 Last data filed at 07/09/2023 8119 Gross per 24 hour  Intake 2066.68 ml  Output --  Net 2066.68 ml    There were no vitals filed for this visit.  Examination:  General: No acute distress.  Still on room air.   ENT/neck: No palpable neck masses or elevated JVD noted  respiratory: Bilateral decreased breath sounds at bases with scattered crackles  CVS: Rate controlled; S1 and S2 are heard  abdominal: Soft, mildly tender in the right upper quadrant; distended mildly; no organomegaly, bowel sounds are heard normally Extremities: No clubbing or edema noted  CNS: Alert and oriented.  No focal neurologic deficit.  Able to move extremities Lymph: No cervical lymphadenopathy  skin: No obvious rashes/petechiae psych: Normal mood, affect and judgment musculoskeletal: No obvious joint tenderness/erythema   Data Reviewed: I have personally reviewed following labs and imaging studies  CBC: Recent Labs  Lab 07/07/23 1613 07/08/23 0011 07/09/23 0042  WBC 5.0 4.6 4.1  HGB 14.6 14.7 13.8  HCT 43.2 43.9 41.2  MCV 90.9 93.6 93.6  PLT 103* 98* 93*   Basic Metabolic Panel: Recent Labs  Lab 07/07/23 1613 07/08/23 0011 07/09/23 0042  NA 136 139 141  K 3.7 3.8 4.6  CL 106 104 107  CO2 21* 28 27  GLUCOSE  110* 105* 88  BUN <5* <5* 5*  CREATININE 0.99 0.89 0.85  CALCIUM 8.7* 8.7* 8.7*  MG  --   --  2.1   GFR: CrCl cannot be calculated (Unknown ideal  weight.). Liver Function Tests: Recent Labs  Lab 07/07/23 1613 07/08/23 0011 07/09/23 0042  AST 24 21 19   ALT 14 14 13   ALKPHOS 65 63 60  BILITOT 0.6 0.5 0.4  PROT 6.9 6.6 5.9*  ALBUMIN 4.1 3.9 3.5   Recent Labs  Lab 07/07/23 1613  LIPASE 28   No results for input(s): "AMMONIA" in the last 168 hours. Coagulation Profile: Recent Labs  Lab 07/07/23 2023 07/08/23 0011 07/09/23 0042 07/10/23 0141  INR 3.9* 4.2* 3.2* 1.4*   Cardiac Enzymes: No results for input(s): "CKTOTAL", "CKMB", "CKMBINDEX", "TROPONINI" in the last 168 hours. BNP (last 3 results) No results for input(s): "PROBNP" in the last 8760 hours. HbA1C: No results for input(s): "HGBA1C" in the last 72 hours. CBG: No results for input(s): "GLUCAP" in the last 168 hours. Lipid Profile: No results for input(s): "CHOL", "HDL", "LDLCALC", "TRIG", "CHOLHDL", "LDLDIRECT" in the last 72 hours. Thyroid Function Tests: No results for input(s): "TSH", "T4TOTAL", "FREET4", "T3FREE", "THYROIDAB" in the last 72 hours. Anemia Panel: No results for input(s): "VITAMINB12", "FOLATE", "FERRITIN", "TIBC", "IRON", "RETICCTPCT" in the last 72 hours. Sepsis Labs: No results for input(s): "PROCALCITON", "LATICACIDVEN" in the last 168 hours.  No results found for this or any previous visit (from the past 240 hour(s)).       Radiology Studies: No results found.      Scheduled Meds:  DULoxetine  40 mg Oral Daily   gabapentin  600 mg Oral TID   lamoTRIgine  400 mg Oral Daily   metoprolol tartrate  12.5 mg Oral Daily   nicotine  14 mg Transdermal Daily   pantoprazole  40 mg Oral Daily   rosuvastatin  20 mg Oral Daily   tiZANidine  4 mg Oral QHS   Continuous Infusions:  lactated ringers 50 mL/hr at 07/09/23 1554           , MD [facility] 07/10/2023, 8:13 AM

## 2023-07-10 NOTE — Progress Notes (Signed)
Progress Note     Subjective: Pt reports some abdominal soreness and is thirsty this AM. Discussed plans for lap chole tomorrow AM and reviewed risks/benefits of procedure. Discussed with [facility] and pharmacy as well.   Objective: Vital signs in last 24 hours: Temp:  [97.5 F (36.4 C)-98.3 F (36.8 C)] 97.6 F (36.4 C) (08/10 0736) Pulse Rate:  [63-67] 65 (08/10 0736) Resp:  [16-20] 20 (08/09 1941) BP: (138-179)/(81-100) 138/87 (08/10 0736) SpO2:  [96 %-100 %] 98 % (08/10 0736) Last BM Date : 07/08/23  Intake/Output from previous day: 08/09 0701 - 08/10 0700 In: 2066.7 [I.V.:2066.7] Out: -  Intake/Output this shift: No intake/output data recorded.  PE: General: pleasant, WD, WN female who is laying in bed in NAD HEENT: head is normocephalic, atraumatic.  Sclera are anicteric Heart: regular, rate, and rhythm.  Lungs: Respiratory effort nonlabored Abd: soft, NT, ND Psych: A&Ox3 with an appropriate affect.    Lab Results:  Recent Labs    07/08/23 0011 07/09/23 0042  WBC 4.6 4.1  HGB 14.7 13.8  HCT 43.9 41.2  PLT 98* 93*   BMET Recent Labs    07/08/23 0011 07/09/23 0042  NA 139 141  K 3.8 4.6  CL 104 107  CO2 28 27  GLUCOSE 105* 88  BUN <5* 5*  CREATININE 0.89 0.85  CALCIUM 8.7* 8.7*   PT/INR Recent Labs    07/09/23 0042 07/10/23 0141  LABPROT 32.8* 17.1*  INR 3.2* 1.4*   CMP     Component Value Date/Time   NA 141 07/09/2023 0042   NA 144 11/24/2017 1218   K 4.6 07/09/2023 0042   CL 107 07/09/2023 0042   CO2 27 07/09/2023 0042   GLUCOSE 88 07/09/2023 0042   BUN 5 (L) 07/09/2023 0042   BUN 15 11/24/2017 1218   CREATININE 0.85 07/09/2023 0042   CALCIUM 8.7 (L) 07/09/2023 0042   PROT 5.9 (L) 07/09/2023 0042   ALBUMIN 3.5 07/09/2023 0042   AST 19 07/09/2023 0042   ALT 13 07/09/2023 0042   ALKPHOS 60 07/09/2023 0042   BILITOT 0.4 07/09/2023 0042   GFRNONAA >60 07/09/2023 0042   GFRAA >60 12/11/2018 0242   Lipase     Component Value  Date/Time   LIPASE 28 07/07/2023 1613       Studies/Results: No results found.  Anti-infectives: Anti-infectives (From admission, onward)    None        Assessment/Plan  Biliary colic -plan for lap chole tomorrow AM, repeal labs in AM -I have explained the procedure, risks, and aftercare of Laparoscopic cholecystectomy.  Risks include but are not limited to anesthesia (MI, CVA, death, prolonged intubation and aspiration), bleeding, infection, wound problems, hernia, bile leak, injury to common bile duct/liver/intestine, possible need for subtotal cholecystectomy or open cholecystectomy, increased risk of DVT/PE and diarrhea post op.  She seems to understand and agrees to proceed.  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   Juliet Rude, [facility], [facility], [facility] 07/10/2023, 10:39 AM Please see Amion  for pager number during day hours 7:00am-4:30pm

## 2023-07-10 NOTE — Progress Notes (Signed)
ANTICOAGULATION CONSULT NOTE  Pharmacy Consult for Heparin Indication: mechanical AVR  Allergies  Allergen Reactions   Demerol Nausea And Vomiting   Ibuprofen Other (See Comments)    Gastritis     Patient Measurements:    Vital Signs: Temp: 98.3 F (36.8 C) (08/10 2108) Temp Source: Oral (08/10 2108) BP: 117/81 (08/10 2108) Pulse Rate: 71 (08/10 2108)  Labs: Recent Labs    07/08/23 0011 07/09/23 0042 07/10/23 0141 07/10/23 1431 07/10/23 2221  HGB 14.7 13.8  --   --   --   HCT 43.9 41.2  --   --   --   PLT 98* 93*  --   --   --   LABPROT 40.9* 32.8* 17.1*  --   --   INR 4.2* 3.2* 1.4*  --   --   HEPARINUNFRC  --   --   --  0.16* 0.29*  CREATININE 0.89 0.85  --   --   --     CrCl cannot be calculated (Unknown ideal weight.).   Assessment: 42 y.o. female with mechanical AVR, Coumadin on hold and INR subtherapeutic, for heparin  Goal of Therapy:  Heparin level 0.3-0.7 units/ml Monitor platelets by anticoagulation protocol: Yes   Plan:  Increase Heparin 1000 units/hr F/U after OR tomorrow  Eddie Candle 07/10/2023,11:50 PM

## 2023-07-10 NOTE — H&P (View-Only) (Signed)
Progress Note     Subjective: Pt reports some abdominal soreness and is thirsty this AM. Discussed plans for lap chole tomorrow AM and reviewed risks/benefits of procedure. Discussed with TRH and pharmacy as well.   Objective: Vital signs in last 24 hours: Temp:  [97.5 F (36.4 C)-98.3 F (36.8 C)] 97.6 F (36.4 C) (08/10 0736) Pulse Rate:  [63-67] 65 (08/10 0736) Resp:  [16-20] 20 (08/09 1941) BP: (138-179)/(81-100) 138/87 (08/10 0736) SpO2:  [96 %-100 %] 98 % (08/10 0736) Last BM Date : 07/08/23  Intake/Output from previous day: 08/09 0701 - 08/10 0700 In: 2066.7 [I.V.:2066.7] Out: -  Intake/Output this shift: No intake/output data recorded.  PE: General: pleasant, WD, WN female who is laying in bed in NAD HEENT: head is normocephalic, atraumatic.  Sclera are anicteric Heart: regular, rate, and rhythm.  Lungs: Respiratory effort nonlabored Abd: soft, NT, ND Psych: A&Ox3 with an appropriate affect.    Lab Results:  Recent Labs    07/08/23 0011 07/09/23 0042  WBC 4.6 4.1  HGB 14.7 13.8  HCT 43.9 41.2  PLT 98* 93*   BMET Recent Labs    07/08/23 0011 07/09/23 0042  NA 139 141  K 3.8 4.6  CL 104 107  CO2 28 27  GLUCOSE 105* 88  BUN <5* 5*  CREATININE 0.89 0.85  CALCIUM 8.7* 8.7*   PT/INR Recent Labs    07/09/23 0042 07/10/23 0141  LABPROT 32.8* 17.1*  INR 3.2* 1.4*   CMP     Component Value Date/Time   NA 141 07/09/2023 0042   NA 144 11/24/2017 1218   K 4.6 07/09/2023 0042   CL 107 07/09/2023 0042   CO2 27 07/09/2023 0042   GLUCOSE 88 07/09/2023 0042   BUN 5 (L) 07/09/2023 0042   BUN 15 11/24/2017 1218   CREATININE 0.85 07/09/2023 0042   CALCIUM 8.7 (L) 07/09/2023 0042   PROT 5.9 (L) 07/09/2023 0042   ALBUMIN 3.5 07/09/2023 0042   AST 19 07/09/2023 0042   ALT 13 07/09/2023 0042   ALKPHOS 60 07/09/2023 0042   BILITOT 0.4 07/09/2023 0042   GFRNONAA >60 07/09/2023 0042   GFRAA >60 12/11/2018 0242   Lipase     Component Value  Date/Time   LIPASE 28 07/07/2023 1613       Studies/Results: No results found.  Anti-infectives: Anti-infectives (From admission, onward)    None        Assessment/Plan  Biliary colic -plan for lap chole tomorrow AM, repeal labs in AM -I have explained the procedure, risks, and aftercare of Laparoscopic cholecystectomy.  Risks include but are not limited to anesthesia (MI, CVA, death, prolonged intubation and aspiration), bleeding, infection, wound problems, hernia, bile leak, injury to common bile duct/liver/intestine, possible need for subtotal cholecystectomy or open cholecystectomy, increased risk of DVT/PE and diarrhea post op.  She seems to understand and agrees to proceed.  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   Juliet Rude, [facility], [facility], [facility] 07/10/2023, 10:39 AM Please see Amion  for pager number during day hours 7:00am-4:30pm

## 2023-07-10 NOTE — Plan of Care (Signed)
  Problem: Nutrition: Goal: Adequate nutrition will be maintained Outcome: Progressing   Problem: Pain Managment: Goal: General experience of comfort will improve Outcome: Progressing   Problem: Coping: Goal: Level of anxiety will decrease Outcome: Progressing   Problem: Education: Goal: Knowledge of General Education information will improve Description: Including pain rating scale, medication(s)/side effects and non-pharmacologic comfort measures Outcome: Progressing

## 2023-07-10 NOTE — Progress Notes (Signed)
Pharmacy Consult for heparin gtt  Indication:  mAVR  Allergies  Allergen Reactions   Demerol Nausea And Vomiting   Ibuprofen Other (See Comments)    Gastritis     Patient Measurements:   Heparin Dosing Weight:  52.4 kg (per wt 06/14/23)   Vital Signs: Temp: 97.6 F (36.4 C) (08/10 0736) Temp Source: Oral (08/10 0736) BP: 138/87 (08/10 0736) Pulse Rate: 65 (08/10 0736)  Labs: Recent Labs    07/07/23 1613 07/07/23 2023 07/08/23 0011 07/09/23 0042 07/10/23 0141  HGB 14.6  --  14.7 13.8  --   HCT 43.2  --  43.9 41.2  --   PLT 103*  --  98* 93*  --   LABPROT  --    < > 40.9* 32.8* 17.1*  INR  --    < > 4.2* 3.2* 1.4*  CREATININE 0.99  --  0.89 0.85  --    < > = values in this interval not displayed.    CrCl cannot be calculated (Unknown ideal weight.).  Assessment: ARIANI MCDARIS a 42 y.o. female presented with biliary colic, on warfarin PTA- currently on hold for anticipated procedure. Pharmacy has been consulted for heparin dosing.   8/10 AM: INR subtherapeutic at 1.4. Last dose of warfarin reportedly 8/7 AM. Hgb has been WNL, PLTs in 90s.   Anticoagulation PTA: warfarin 7.5 mg Sun, MWF, 11.25 mg TTS  Goal of Therapy:  INR 2-3 Heparin level 0.3-0.7 units/ml Monitor platelets by anticoagulation protocol: Yes   Plan:  Start heparin infusion at 750 units/hour Check heparin level in 6 hours Per CCS, plan to hold heparin infusion 8/11 0300 prior to procedure    Jani Gravel, PharmD Clinical Pharmacist  07/10/2023 8:27 AM

## 2023-07-11 ENCOUNTER — Encounter (HOSPITAL_COMMUNITY)
Admission: EM | Disposition: A | Discharge status: Home / Self Care | Payer: Self-pay | Source: Home / Self Care | Attending: Internal Medicine

## 2023-07-11 ENCOUNTER — Other Ambulatory Visit: Payer: Self-pay

## 2023-07-11 ENCOUNTER — Encounter (HOSPITAL_COMMUNITY): Payer: Self-pay | Admitting: Internal Medicine

## 2023-07-11 ENCOUNTER — Inpatient Hospital Stay (HOSPITAL_COMMUNITY): Payer: Managed Care, Other (non HMO) | Admitting: Anesthesiology

## 2023-07-11 ENCOUNTER — Inpatient Hospital Stay (HOSPITAL_COMMUNITY): Payer: Managed Care, Other (non HMO)

## 2023-07-11 DIAGNOSIS — I10 Essential (primary) hypertension: Secondary | ICD-10-CM

## 2023-07-11 DIAGNOSIS — K811 Chronic cholecystitis: Secondary | ICD-10-CM | POA: Diagnosis not present

## 2023-07-11 DIAGNOSIS — K8050 Calculus of bile duct without cholangitis or cholecystitis without obstruction: Secondary | ICD-10-CM | POA: Diagnosis not present

## 2023-07-11 DIAGNOSIS — J4530 Mild persistent asthma, uncomplicated: Secondary | ICD-10-CM

## 2023-07-11 DIAGNOSIS — F17210 Nicotine dependence, cigarettes, uncomplicated: Secondary | ICD-10-CM

## 2023-07-11 HISTORY — PX: CHOLECYSTECTOMY: SHX55

## 2023-07-11 LAB — CBC
HCT: 43.2 % (ref 36.0–46.0)
Hemoglobin: 14.2 g/dL (ref 12.0–15.0)
MCH: 31.5 pg (ref 26.0–34.0)
MCHC: 32.9 g/dL (ref 30.0–36.0)
MCV: 95.8 fL (ref 80.0–100.0)
Platelets: 92 10*3/uL — ABNORMAL LOW (ref 150–400)
RBC: 4.51 MIL/uL (ref 3.87–5.11)
RDW: 12.6 % (ref 11.5–15.5)
WBC: 4.7 10*3/uL (ref 4.0–10.5)
nRBC: 0 % (ref 0.0–0.2)

## 2023-07-11 LAB — PROTIME-INR
INR: 1.1 (ref 0.8–1.2)
Prothrombin Time: 14.3 seconds (ref 11.4–15.2)

## 2023-07-11 LAB — COMPREHENSIVE METABOLIC PANEL
ALT: 50 U/L — ABNORMAL HIGH (ref 0–44)
AST: 61 U/L — ABNORMAL HIGH (ref 15–41)
Albumin: 3.5 g/dL (ref 3.5–5.0)
Alkaline Phosphatase: 65 U/L (ref 38–126)
Anion gap: 9 (ref 5–15)
BUN: 5 mg/dL — ABNORMAL LOW (ref 6–20)
CO2: 27 mmol/L (ref 22–32)
Calcium: 8.6 mg/dL — ABNORMAL LOW (ref 8.9–10.3)
Chloride: 103 mmol/L (ref 98–111)
Creatinine, Ser: 0.9 mg/dL (ref 0.44–1.00)
GFR, Estimated: >60 mL/min (ref >60)
Glucose, Bld: 125 mg/dL — ABNORMAL HIGH (ref 70–99)
Potassium: 4 mmol/L (ref 3.5–5.1)
Sodium: 139 mmol/L (ref 135–145)
Total Bilirubin: 0.6 mg/dL (ref 0.3–1.2)
Total Protein: 5.9 g/dL — ABNORMAL LOW (ref 6.5–8.1)

## 2023-07-11 LAB — MRSA NEXT GEN BY PCR, NASAL: MRSA by PCR Next Gen: NOT DETECTED

## 2023-07-11 LAB — MAGNESIUM: Magnesium: 1.8 mg/dL (ref 1.7–2.4)

## 2023-07-11 SURGERY — LAPAROSCOPIC CHOLECYSTECTOMY WITH INTRAOPERATIVE CHOLANGIOGRAM
Anesthesia: General

## 2023-07-11 MED ORDER — CEFAZOLIN SODIUM 1 G IJ SOLR
INTRAMUSCULAR | Status: AC
  Filled 2023-07-11: qty 10

## 2023-07-11 MED ORDER — BISACODYL 10 MG RE SUPP: 10.0000 mg | Freq: Every day | RECTAL | Status: DC | PRN

## 2023-07-11 MED ORDER — LACTATED RINGERS IV SOLN: INTRAVENOUS | Status: DC

## 2023-07-11 MED ORDER — ROCURONIUM BROMIDE 10 MG/ML (PF) SYRINGE
PREFILLED_SYRINGE | INTRAVENOUS | Status: DC | PRN
  Administered 2023-07-11: 50 mg via INTRAVENOUS

## 2023-07-11 MED ORDER — BUPIVACAINE-EPINEPHRINE 0.25% -1:200000 IJ SOLN
INTRAMUSCULAR | Status: DC | PRN
  Administered 2023-07-11: 30 mL

## 2023-07-11 MED ORDER — HEPARIN (PORCINE) 25000 UT/250ML-% IV SOLN
1000.0000 [IU]/h | INTRAVENOUS | Status: AC
  Administered 2023-07-11 – 2023-07-12 (×2): 1000 [IU]/h via INTRAVENOUS
  Filled 2023-07-11 (×2): qty 250

## 2023-07-11 MED ORDER — OXYCODONE HCL 5 MG PO TABS
ORAL_TABLET | ORAL | Status: AC
  Filled 2023-07-11: qty 1

## 2023-07-11 MED ORDER — ARTIFICIAL TEARS OPHTHALMIC OINT
TOPICAL_OINTMENT | OPHTHALMIC | Status: AC
  Filled 2023-07-11: qty 3.5

## 2023-07-11 MED ORDER — OXYCODONE HCL 5 MG PO TABS
5.0000 mg | ORAL_TABLET | Freq: Once | ORAL | Status: AC | PRN
  Administered 2023-07-11: 5 mg via ORAL

## 2023-07-11 MED ORDER — SODIUM CHLORIDE 0.9 % IR SOLN
Status: DC | PRN
  Administered 2023-07-11: 1000 mL

## 2023-07-11 MED ORDER — PROPOFOL 10 MG/ML IV BOLUS
INTRAVENOUS | Status: AC
  Filled 2023-07-11: qty 20

## 2023-07-11 MED ORDER — DEXAMETHASONE SODIUM PHOSPHATE 10 MG/ML IJ SOLN
INTRAMUSCULAR | Status: AC
  Filled 2023-07-11: qty 1

## 2023-07-11 MED ORDER — MIDAZOLAM HCL 2 MG/2ML IJ SOLN
INTRAMUSCULAR | Status: DC | PRN
  Administered 2023-07-11: 2 mg via INTRAVENOUS

## 2023-07-11 MED ORDER — ACETAMINOPHEN 10 MG/ML IV SOLN
INTRAVENOUS | Status: AC
  Filled 2023-07-11: qty 100

## 2023-07-11 MED ORDER — ONDANSETRON HCL 4 MG/2ML IJ SOLN
INTRAMUSCULAR | Status: AC
  Filled 2023-07-11: qty 2

## 2023-07-11 MED ORDER — PROPOFOL 10 MG/ML IV BOLUS
INTRAVENOUS | Status: DC | PRN
Start: 2023-07-11 — End: 2023-07-11
  Administered 2023-07-11: 40 mg via INTRAVENOUS
  Administered 2023-07-11: 90 mg via INTRAVENOUS

## 2023-07-11 MED ORDER — POLYETHYLENE GLYCOL 3350 17 G PO PACK
17.0000 g | PACK | Freq: Two times a day (BID) | ORAL | Status: DC
  Administered 2023-07-11 – 2023-07-15 (×5): 17 g via ORAL
  Filled 2023-07-11 (×8): qty 1

## 2023-07-11 MED ORDER — SUGAMMADEX SODIUM 200 MG/2ML IV SOLN
INTRAVENOUS | Status: DC | PRN
  Administered 2023-07-11: 100 mg via INTRAVENOUS

## 2023-07-11 MED ORDER — ONDANSETRON HCL 4 MG/2ML IJ SOLN
INTRAMUSCULAR | Status: DC | PRN
  Administered 2023-07-11: 4 mg via INTRAVENOUS

## 2023-07-11 MED ORDER — ACETAMINOPHEN 325 MG PO TABS
650.0000 mg | ORAL_TABLET | Freq: Four times a day (QID) | ORAL | Status: DC
  Administered 2023-07-11 – 2023-07-12 (×4): 650 mg via ORAL
  Filled 2023-07-11 (×5): qty 2

## 2023-07-11 MED ORDER — FENTANYL CITRATE (PF) 250 MCG/5ML IJ SOLN
INTRAMUSCULAR | Status: AC
  Filled 2023-07-11: qty 5

## 2023-07-11 MED ORDER — CHLORHEXIDINE GLUCONATE 0.12 % MT SOLN
OROMUCOSAL | Status: AC
  Administered 2023-07-11: 15 mL via OROMUCOSAL
  Filled 2023-07-11: qty 15

## 2023-07-11 MED ORDER — MIDAZOLAM HCL 2 MG/2ML IJ SOLN
INTRAMUSCULAR | Status: AC
  Filled 2023-07-11: qty 2

## 2023-07-11 MED ORDER — LIDOCAINE 2% (20 MG/ML) 5 ML SYRINGE
INTRAMUSCULAR | Status: DC | PRN
  Administered 2023-07-11: 40 mg via INTRAVENOUS

## 2023-07-11 MED ORDER — OXIDIZED CELLULOSE EX PADS
MEDICATED_PAD | CUTANEOUS | Status: DC | PRN
  Administered 2023-07-11: 1 via TOPICAL

## 2023-07-11 MED ORDER — FENTANYL CITRATE (PF) 100 MCG/2ML IJ SOLN
25.0000 ug | INTRAMUSCULAR | Status: DC | PRN
  Administered 2023-07-11 (×4): 25 ug via INTRAVENOUS

## 2023-07-11 MED ORDER — ACETAMINOPHEN 10 MG/ML IV SOLN
1000.0000 mg | Freq: Once | INTRAVENOUS | Status: DC | PRN
  Administered 2023-07-11: 1000 mg via INTRAVENOUS

## 2023-07-11 MED ORDER — 0.9 % SODIUM CHLORIDE (POUR BTL) OPTIME
TOPICAL | Status: DC | PRN
  Administered 2023-07-11: 1000 mL

## 2023-07-11 MED ORDER — BUPIVACAINE-EPINEPHRINE (PF) 0.25% -1:200000 IJ SOLN
INTRAMUSCULAR | Status: AC
  Filled 2023-07-11: qty 30

## 2023-07-11 MED ORDER — ORAL CARE MOUTH RINSE: 15.0000 mL | Freq: Once | OROMUCOSAL | Status: AC

## 2023-07-11 MED ORDER — ACETAMINOPHEN 500 MG PO TABS: 1000.0000 mg | ORAL_TABLET | Freq: Once | ORAL | Status: DC | PRN

## 2023-07-11 MED ORDER — CEFAZOLIN SODIUM-DEXTROSE 1-4 GM/50ML-% IV SOLN
INTRAVENOUS | Status: DC | PRN
  Administered 2023-07-11: 1 g via INTRAVENOUS

## 2023-07-11 MED ORDER — OXYCODONE HCL 5 MG/5ML PO SOLN: 5.0000 mg | Freq: Once | ORAL | Status: AC | PRN

## 2023-07-11 MED ORDER — LIDOCAINE 2% (20 MG/ML) 5 ML SYRINGE
INTRAMUSCULAR | Status: AC
  Filled 2023-07-11: qty 5

## 2023-07-11 MED ORDER — CHLORHEXIDINE GLUCONATE 0.12 % MT SOLN: 15.0000 mL | Freq: Once | OROMUCOSAL | Status: AC

## 2023-07-11 MED ORDER — ACETAMINOPHEN 160 MG/5ML PO SOLN: 1000.0000 mg | Freq: Once | ORAL | Status: DC | PRN

## 2023-07-11 MED ORDER — DOCUSATE SODIUM 100 MG PO CAPS
100.0000 mg | ORAL_CAPSULE | Freq: Two times a day (BID) | ORAL | Status: DC
  Administered 2023-07-11 – 2023-07-15 (×8): 100 mg via ORAL
  Filled 2023-07-11 (×9): qty 1

## 2023-07-11 MED ORDER — FENTANYL CITRATE (PF) 100 MCG/2ML IJ SOLN
INTRAMUSCULAR | Status: AC
  Filled 2023-07-11: qty 2

## 2023-07-11 MED ORDER — FENTANYL CITRATE (PF) 250 MCG/5ML IJ SOLN
INTRAMUSCULAR | Status: DC | PRN
  Administered 2023-07-11: 50 ug via INTRAVENOUS
  Administered 2023-07-11: 100 ug via INTRAVENOUS
  Administered 2023-07-11 (×2): 50 ug via INTRAVENOUS

## 2023-07-11 MED ORDER — DEXAMETHASONE SODIUM PHOSPHATE 10 MG/ML IJ SOLN
INTRAMUSCULAR | Status: DC | PRN
  Administered 2023-07-11: 5 mg via INTRAVENOUS

## 2023-07-11 SURGICAL SUPPLY — 47 items
ADH SKN CLS APL DERMABOND .7 (GAUZE/BANDAGES/DRESSINGS) ×1
APL PRP STRL LF DISP 70% ISPRP (MISCELLANEOUS) ×1
APPLIER CLIP 5 13 M/L LIGAMAX5 (MISCELLANEOUS) ×1
APR CLP MED LRG 5 ANG JAW (MISCELLANEOUS) ×1
BAG COUNTER SPONGE SURGICOUNT (BAG) ×1 IMPLANT
BAG SPNG CNTER NS LX DISP (BAG) ×1
CANISTER SUCT 3000ML PPV (MISCELLANEOUS) ×1 IMPLANT
CHLORAPREP W/TINT 26 (MISCELLANEOUS) ×1 IMPLANT
CLIP APPLIE 5 13 M/L LIGAMAX5 (MISCELLANEOUS) ×1 IMPLANT
CNTNR URN SCR LID CUP LEK RST (MISCELLANEOUS) ×1 IMPLANT
CONT SPEC 4OZ STRL OR WHT (MISCELLANEOUS) ×1
COVER MAYO STAND STRL (DRAPES) IMPLANT
COVER SURGICAL LIGHT HANDLE (MISCELLANEOUS) ×1 IMPLANT
DERMABOND ADVANCED .7 DNX12 (GAUZE/BANDAGES/DRESSINGS) ×1 IMPLANT
DRAPE C-ARM 42X120 X-RAY (DRAPES) IMPLANT
ELECT REM PT RETURN 9FT ADLT (ELECTROSURGICAL) ×1
ELECTRODE REM PT RTRN 9FT ADLT (ELECTROSURGICAL) ×1 IMPLANT
GAUZE 4X4 16PLY ~~LOC~~+RFID DBL (SPONGE) ×1 IMPLANT
GLOVE BIO SURGEON STRL SZ 6 (GLOVE) ×1 IMPLANT
GLOVE INDICATOR 6.5 STRL GRN (GLOVE) ×1 IMPLANT
GOWN STRL REUS W/ TWL LRG LVL3 (GOWN DISPOSABLE) ×1 IMPLANT
GOWN STRL REUS W/TWL LRG LVL3 (GOWN DISPOSABLE) ×1
GRASPER SUT TROCAR 14GX15 (MISCELLANEOUS) ×1 IMPLANT
IRRIG SUCT STRYKERFLOW 2 WTIP (MISCELLANEOUS) ×1
IRRIGATION SUCT STRKRFLW 2 WTP (MISCELLANEOUS) ×1 IMPLANT
KIT BASIN OR (CUSTOM PROCEDURE TRAY) ×1 IMPLANT
KIT IMAGING PINPOINTPAQ (MISCELLANEOUS) IMPLANT
KIT TURNOVER KIT B (KITS) ×1 IMPLANT
NDL INSUFFLATION 14GA 120MM (NEEDLE) ×1 IMPLANT
NEEDLE INSUFFLATION 14GA 120MM (NEEDLE) ×1 IMPLANT
NS IRRIG 1000ML POUR BTL (IV SOLUTION) ×1 IMPLANT
PAD ARMBOARD 7.5X6 YLW CONV (MISCELLANEOUS) ×1 IMPLANT
SCISSORS LAP 5X35 DISP (ENDOMECHANICALS) ×1 IMPLANT
SET CHOLANGIOGRAPH 5 50 .035 (SET/KITS/TRAYS/PACK) IMPLANT
SET TUBE SMOKE EVAC HIGH FLOW (TUBING) ×1 IMPLANT
SLEEVE Z-THREAD 5X100MM (TROCAR) ×2 IMPLANT
SPONGE T-LAP 18X18 ~~LOC~~+RFID (SPONGE) ×1 IMPLANT
SUT MNCRL AB 4-0 PS2 18 (SUTURE) ×1 IMPLANT
SYS BAG RETRIEVAL 10MM (BASKET) ×1
SYSTEM BAG RETRIEVAL 10MM (BASKET) ×1 IMPLANT
TOWEL GREEN STERILE (TOWEL DISPOSABLE) ×1 IMPLANT
TOWEL GREEN STERILE FF (TOWEL DISPOSABLE) ×1 IMPLANT
TRAY LAPAROSCOPIC MC (CUSTOM PROCEDURE TRAY) ×1 IMPLANT
TROCAR 11X100 Z THREAD (TROCAR) ×1 IMPLANT
TROCAR Z-THREAD OPTICAL 5X100M (TROCAR) ×1 IMPLANT
WARMER LAPAROSCOPE (MISCELLANEOUS) ×1 IMPLANT
WATER STERILE IRR 1000ML POUR (IV SOLUTION) ×1 IMPLANT

## 2023-07-11 NOTE — Plan of Care (Signed)
  Problem: Nutrition: Goal: Adequate nutrition will be maintained Outcome: Progressing   Problem: Pain Managment: Goal: General experience of comfort will improve Outcome: Progressing   Problem: Safety: Goal: Ability to remain free from injury will improve Outcome: Progressing   Problem: Coping: Goal: Level of anxiety will decrease Outcome: Progressing   Problem: Education: Goal: Knowledge of General Education information will improve Description: Including pain rating scale, medication(s)/side effects and non-pharmacologic comfort measures Outcome: Progressing

## 2023-07-11 NOTE — Progress Notes (Signed)
PROGRESS NOTE    ILLONA Obrien  [name]:[id number] DOB: 12-Nov-1981 DOA: 07/07/2023 PCP: Etta Grandchild, MD   Brief Narrative:   42 y.o. female with medical history significant of essential hypertension, GERD, hyperlipidemia, ADD, asthma, history of aortic stenosis status post minimally invasive aortic valve replacement with bileaflet mechanical valve currently on chronic anticoagulation with warfarin, recent diagnosis of cholelithiasis on 06/25/2023 with outpatient general surgery evaluations and presented with worsening abdominal pain with vomiting.  General surgery was consulted and recommended admission for possible lap cholecystectomy.  On presentation, INR was 3.2: Patient was admitted under [facility] service.  Assessment & Plan:   Biliary colic in a patient with recent diagnosis of cholelithiasis. -Status post laparoscopically cholecystectomy today by general surgery. -Continue current pain management and antiemetics as needed.  History of aortic stenosis status post minimally invasive aortic valve replacement with bileaflet mechanical valve currently on chronic anticoagulation with warfarin Supratherapeutic INR -INR pending today.  Coumadin on hold.  Heparin drip on hold for surgery this morning.  Resume once cleared by general surgery. -Outpatient follow-up with cardiology  Thrombocytopenia -Questionable cause.  No labs today.  No signs bleeding.  Essential hypertension -Blood pressure intermittently elevated.  Monitor.  Continue metoprolol  Hyperlipidemia -continue statin  Bipolar disorder -Continue duloxetine  Mild persistent asthma -Currently stable.  Continue as needed albuterol  Tobacco abuse -Counseled by admitting hospitalist regarding cessation.  Continue nicotine patch.   DVT prophylaxis: Heparin drip on hold this morning for surgery  code Status: Full Family Communication: None at bedside Disposition Plan: Status is: Inpatient Remains inpatient appropriate  because: Of severity of illness    Consultants: General surgery  Procedures: None  Antimicrobials: None   Subjective: Patient seen and examined at bedside in PACU.  Communicated via ASL interpreter/call via.  Complains of abdominal pain.  No fever, chest pain or vomiting reported.   Objective: Vitals:   07/10/23 1600 07/10/23 2108 07/11/23 0522 07/11/23 0701  BP: (!) 173/93 117/81 115/80 (!) 148/82  Pulse: 63 71 61 63  Resp: 17 18 18 14   Temp: 98.2 F (36.8 C) 98.3 F (36.8 C) 98.2 F (36.8 C) 98.1 F (36.7 C)  TempSrc: Oral Oral Oral Oral  SpO2: 97% 97% 97% 97%  Weight:    51.3 kg  Height:    4' 11.5" (1.511 m)    Intake/Output Summary (Last 24 hours) at 07/11/2023 0825 Last data filed at 07/10/2023 0954 Gross per 24 hour  Intake 360 ml  Output --  Net 360 ml    Filed Weights   07/11/23 0701  Weight: 51.3 kg    Examination:  General: On room air.  No distress.  Slow to respond. respiratory: Decreased breath sounds at bases bilaterally with some crackles CVS: Currently rate controlled; S1-S2 heard  abdominal: Soft, mildly tender in the right upper quadrant; slightly distended, no organomegaly; normal bowel sounds are heard  extremities: Trace lower extremity edema; no clubbing.      Data Reviewed: I have personally reviewed following labs and imaging studies  CBC: Recent Labs  Lab 07/07/23 1613 07/08/23 0011 07/09/23 0042  WBC 5.0 4.6 4.1  HGB 14.6 14.7 13.8  HCT 43.2 43.9 41.2  MCV 90.9 93.6 93.6  PLT 103* 98* 93*   Basic Metabolic Panel: Recent Labs  Lab 07/07/23 1613 07/08/23 0011 07/09/23 0042  NA 136 139 141  K 3.7 3.8 4.6  CL 106 104 107  CO2 21* 28 27  GLUCOSE 110* 105* 88  BUN <5* <  5* 5*  CREATININE 0.99 0.89 0.85  CALCIUM 8.7* 8.7* 8.7*  MG  --   --  2.1   GFR: Estimated Creatinine Clearance: 59.8 mL/min (by C-G formula based on SCr of 0.85 mg/dL). Liver Function Tests: Recent Labs  Lab 07/07/23 1613 07/08/23 0011  07/09/23 0042  AST 24 21 19   ALT 14 14 13   ALKPHOS 65 63 60  BILITOT 0.6 0.5 0.4  PROT 6.9 6.6 5.9*  ALBUMIN 4.1 3.9 3.5   Recent Labs  Lab 07/07/23 1613  LIPASE 28   No results for input(s): "AMMONIA" in the last 168 hours. Coagulation Profile: Recent Labs  Lab 07/07/23 2023 07/08/23 0011 07/09/23 0042 07/10/23 0141  INR 3.9* 4.2* 3.2* 1.4*   Cardiac Enzymes: No results for input(s): "CKTOTAL", "CKMB", "CKMBINDEX", "TROPONINI" in the last 168 hours. BNP (last 3 results) No results for input(s): "PROBNP" in the last 8760 hours. HbA1C: No results for input(s): "HGBA1C" in the last 72 hours. CBG: No results for input(s): "GLUCAP" in the last 168 hours. Lipid Profile: No results for input(s): "CHOL", "HDL", "LDLCALC", "TRIG", "CHOLHDL", "LDLDIRECT" in the last 72 hours. Thyroid Function Tests: No results for input(s): "TSH", "T4TOTAL", "FREET4", "T3FREE", "THYROIDAB" in the last 72 hours. Anemia Panel: No results for input(s): "VITAMINB12", "FOLATE", "FERRITIN", "TIBC", "IRON", "RETICCTPCT" in the last 72 hours. Sepsis Labs: No results for input(s): "PROCALCITON", "LATICACIDVEN" in the last 168 hours.  Recent Results (from the past 240 hour(s))  MRSA Next Gen by PCR, Nasal     Status: None   Collection Time: 07/10/23 10:55 PM   Specimen: Nasal Mucosa; Nasal Swab  Result Value Ref Range Status   MRSA by PCR Next Gen NOT DETECTED NOT DETECTED Final    Comment: (NOTE) The GeneXpert MRSA Assay (FDA approved for NASAL specimens only), is one component of a comprehensive MRSA colonization surveillance program. It is not intended to diagnose MRSA infection nor to guide or monitor treatment for MRSA infections. Test performance is not FDA approved in patients less than 33 years old. Performed at [facility], [address], [place], [place] [address]          Radiology Studies: No results found.      Scheduled Meds:  [MAR Hold] DULoxetine  40 mg Oral  Daily   [MAR Hold] gabapentin  600 mg Oral TID   [MAR Hold] lamoTRIgine  400 mg Oral Daily   [MAR Hold] metoprolol tartrate  12.5 mg Oral Daily   [MAR Hold] nicotine  14 mg Transdermal Daily   [MAR Hold] pantoprazole  40 mg Oral Daily   [MAR Hold] rosuvastatin  20 mg Oral Daily   [MAR Hold] tiZANidine  4 mg Oral QHS   Continuous Infusions:  acetaminophen     lactated ringers 50 mL/hr at 07/11/23 0516   lactated ringers 10 mL/hr at 07/11/23 2778          Glade Lloyd, MD [facility] 07/11/2023, 8:25 AM

## 2023-07-11 NOTE — Op Note (Signed)
Operative Note  Danielle Obrien 42 y.o. female [id number]  07/11/2023  Surgeon: Berna Bue MD FACS  Procedure performed: Laparoscopic Cholecystectomy  Procedure classification: urgent  Preop diagnosis: Symptomatic cholelithiasis Post-op diagnosis/intraop findings: Chronic cholecystitis  Specimens: gallbladder  Retained items: none  EBL: minimal  Complications: none  Description of procedure: After obtaining informed consent the patient was brought to the operating room. Antibiotics were administered. SCD's were applied. General endotracheal anesthesia was initiated and a formal time-out was performed. The abdomen was prepped and draped in the usual sterile fashion and the abdomen was entered using  a left subcostal Veress needle  after instilling the site with local. Insufflation to was obtained, infraumbilical 5mm trocar and camera inserted, and gross inspection revealed no evidence of injury from our entry or other intraabdominal abnormalities. Two 5mm trocars were introduced in the right midclavicular and right anterior axillary lines under direct visualization and following infiltration with local. An 11mm trocar was placed in the epigastrium.  The gallbladder is distended and encased in thin omental adhesions, in addition to which it is extremely intrahepatic and there is actually a band of liver capsule going across the midportion of the gallbladder body and compressing it in this area.  The infundibulum is distended and adherent to the underlying duodenum.  The gallbladder fundus was retracted cephalad and omental adhesions were carefully taken down with cautery and blunt dissection, carefully protecting the underlying duodenum.  The band of liver capsule going over the midportion of the gallbladder body was divided with cautery.  We carefully bluntly dissected the duodenum away from the infundibulum and then the infundibulum was retracted laterally. A combination of hook  electrocautery and blunt dissection was utilized to clear the peritoneum from the neck and cystic duct, circumferentially isolating the cystic artery and cystic duct and lifting the gallbladder from the cystic plate. The critical view of safety was achieved with the cystic artery, cystic duct, and liver bed visualized between them with no other structures.  The porta was visible medially and was well away from the area of dissection.  The cystic artery was clipped with a single clip proximally and distally and divided as was the cystic duct with four clips on the proximal end. The gallbladder was dissected from the liver plate using electrocautery.  This was somewhat tedious due to the fairly intrahepatic nature as described above.  Once freed the gallbladder was placed in an endocatch bag and removed intact through the epigastric trocar site. A minimal amount of bleeding on the liver bed was controlled with cautery.  The right upper quadrant was irrigated with warm sterile saline and the effluent was clear. Hemostasis was once again confirmed, and reinspection of the abdomen revealed no injuries. The clips were well opposed without any bile leak from the duct or the liver bed.  Surgicel snow was placed in the liver bed.  The 11mm trocar site in the epigastrium was closed with 2 simple interrupted 0 vicryls in the fascia under direct visualization using a PMI device. The abdomen was desufflated and all trocars removed. The skin incisions were closed with subcuticular 4-0 monocryl and Dermabond. The patient was awakened, extubated and transported to the recovery room in stable condition.    All counts were correct at the completion of the case.

## 2023-07-11 NOTE — Interval H&P Note (Signed)
History and Physical Interval Note:  07/11/2023 6:58 AM  Danielle Obrien  has presented today for surgery, with the diagnosis of Cholecystitis.  The various methods of treatment have been discussed with the patient and family. After consideration of risks, benefits and other options for treatment, the patient has consented to  Procedure(s): LAPAROSCOPIC CHOLECYSTECTOMY WITH POSSIBLE INTRAOPERATIVE CHOLANGIOGRAM (N/A) as a surgical intervention.  The patient's history has been reviewed, patient examined, no change in status, stable for surgery.  I have reviewed the patient's chart and labs.  Questions were answered to the patient's satisfaction.      Lollie Sails

## 2023-07-11 NOTE — Progress Notes (Signed)
Pt was observed by this reporting nurse walking briskly down the hallway in apparent dissatisfaction about something. This nurse followed up with the pt to find out what had upset her. The pt expressed concern that when she called the front desk seeking assistance , a man who answered the call bell had sounded very impatient and frustrated at her and muttered something she couldn't understand in a rather unhappy voice and tone and hung up on her without finding out the reason for her call. Pt is hard of hearing. When asked what time that occurred, she mentioned that it was during the time the nurse secretary was on break. This nurse communicated that he had covered for the secretary while she was on break. The patient responded that reporting nurse was not the one who had hung up on her as she clearly could tell the difference in voices between the person who answered her call and the reporter's voice. She added that she was very good at differentiating voices. Reporting nurse apologized for this occurrence and the pt seemed satisfied with the apology.

## 2023-07-11 NOTE — Anesthesia Postprocedure Evaluation (Signed)
Anesthesia Post Note  Patient: Danielle Obrien  Procedure(s) Performed: LAPAROSCOPIC CHOLECYSTECTOMY WITH POSSIBLE INTRAOPERATIVE CHOLANGIOGRAM     Patient location during evaluation: PACU Anesthesia Type: General Level of consciousness: awake and alert Pain management: pain level controlled Vital Signs Assessment: post-procedure vital signs reviewed and stable Respiratory status: spontaneous breathing, nonlabored ventilation and respiratory function stable Cardiovascular status: blood pressure returned to baseline and stable Postop Assessment: no apparent nausea or vomiting Anesthetic complications: no   No notable events documented.  Last Vitals:  Vitals:   07/11/23 1000 07/11/23 1034  BP: (!) 182/101 (!) 152/85  Pulse: 65 68  Resp: 15 15  Temp: 36.6 C (!) 36.4 C  SpO2: 99% 96%    Last Pain:  Vitals:   07/11/23 1034  TempSrc: Oral  PainSc:                   

## 2023-07-11 NOTE — Anesthesia Procedure Notes (Signed)
Procedure Name: Intubation Date/Time: 07/11/2023 7:46 AM  Performed by: Mayer Camel, CRNAPre-anesthesia Checklist: Patient identified, Emergency Drugs available, Suction available and Patient being monitored Patient Re-evaluated:Patient Re-evaluated prior to induction Oxygen Delivery Method: Circle System Utilized Preoxygenation: Pre-oxygenation with 100% oxygen Induction Type: IV induction Ventilation: Mask ventilation without difficulty Laryngoscope Size: Miller and 2 Grade View: Grade I Tube type: Oral Tube size: 6.5 mm Number of attempts: 1 Airway Equipment and Method: Stylet and Oral airway Placement Confirmation: ETT inserted through vocal cords under direct vision, positive ETCO2 and breath sounds checked- equal and bilateral Secured at: 20 cm Tube secured with: Tape Dental Injury: Teeth and Oropharynx as per pre-operative assessment

## 2023-07-11 NOTE — Plan of Care (Signed)
  Problem: Education: Goal: Knowledge of General Education information will improve Description: Including pain rating scale, medication(s)/side effects and non-pharmacologic comfort measures Outcome: Progressing   Problem: Elimination: Goal: Will not experience complications related to urinary retention Outcome: Progressing   Problem: Pain Managment: Goal: General experience of comfort will improve Outcome: Progressing   Problem: Skin Integrity: Goal: Risk for impaired skin integrity will decrease Outcome: Progressing   

## 2023-07-11 NOTE — OR Nursing (Signed)
Dr Maple Hudson at bedside, patient with hypertension.  Orders received for patient to take home dose metoprolol on arrival back to inpatient room. No orders to treat in PACU.

## 2023-07-11 NOTE — Progress Notes (Signed)
Hep drip on hold per Dr. And Pharmacy order.

## 2023-07-11 NOTE — Transfer of Care (Signed)
Immediate Anesthesia Transfer of Care Note  Patient: Danielle Obrien  Procedure(s) Performed: LAPAROSCOPIC CHOLECYSTECTOMY WITH POSSIBLE INTRAOPERATIVE CHOLANGIOGRAM  Patient Location: PACU  Anesthesia Type:General  Level of Consciousness: awake, alert , and oriented  Airway & Oxygen Therapy: Patient Spontanous Breathing and Patient connected to face mask oxygen  Post-op Assessment: Report given to RN and Post -op Vital signs reviewed and stable  Post vital signs: Reviewed and stable  Last Vitals:  Vitals Value Taken Time  BP 185/92 07/11/23 0930  Temp    Pulse 71 07/11/23 0932  Resp 19 07/11/23 0932  SpO2 91 % 07/11/23 0932  Vitals shown include unfiled device data.  Last Pain:  Vitals:   07/11/23 0701  TempSrc: Oral  PainSc: 6       Patients Stated Pain Goal: 0 (07/09/23 1846)  Complications: No notable events documented.

## 2023-07-11 NOTE — Progress Notes (Signed)
Pharmacy Consult for heparin gtt  Indication:  mAVR  Allergies  Allergen Reactions   Demerol Nausea And Vomiting   Ibuprofen Other (See Comments)    Gastritis     Patient Measurements: Height: 4' 11.5" (151.1 cm) Weight: 51.3 kg (113 lb) IBW/kg (Calculated) : 44.35 Heparin Dosing Weight: 51 kg  Vital Signs: Temp: 97.9 F (36.6 C) (08/11 1502) Temp Source: Oral (08/11 1502) BP: 135/87 (08/11 1502) Pulse Rate: 67 (08/11 1502)  Labs: Recent Labs    07/09/23 0042 07/10/23 0141 07/10/23 1431 07/10/23 2221 07/11/23 0128 07/11/23 1224  HGB 13.8  --   --   --  14.2  --   HCT 41.2  --   --   --  43.2  --   PLT 93*  --   --   --  92*  --   LABPROT 32.8* 17.1*  --   --   --  14.3  INR 3.2* 1.4*  --   --   --  1.1  HEPARINUNFRC  --   --  0.16* 0.29*  --   --   CREATININE 0.85  --   --   --   --  0.90    Estimated Creatinine Clearance: 56.5 mL/min (by C-G formula based on SCr of 0.9 mg/dL).  Assessment: Danielle Obrien a 42 y.o. female presented with biliary colic, on warfarin PTA- currently on hold for anticipated procedure. Pharmacy has been consulted for heparin dosing.   Anticoagulation PTA: warfarin 7.5 mg Sun, MWF, 11.25 mg TTS  8/11 PM: s/p lap-chole today. Pharmacy consulted to resume IV heparin at 19:00 with no bolus. No bleeding noted, Hgb WNL, platelets are low stable.  Goal of Therapy:  INR 2-3 Heparin level 0.3-0.7 units/ml Monitor platelets by anticoagulation protocol: Yes   Plan:  Resume heparin infusion at 1000 units/hr with no bolus 6h heparin level Daily heparin level, CBC Monitor for s/sx of bleeding  Thank you for involving pharmacy in this patient's care.  Danielle Obrien, PharmD, BCPS Clinical Pharmacist Clinical phone for 07/11/2023 is [phone] 07/11/2023 4:28 PM

## 2023-07-11 NOTE — Anesthesia Preprocedure Evaluation (Signed)
Anesthesia Evaluation  Patient identified by MRN, date of birth, ID band Patient awake    Reviewed: Allergy & Precautions, NPO status , Patient's Chart, lab work & pertinent test results  History of Anesthesia Complications Negative for: history of anesthetic complications  Airway Mallampati: III  TM Distance: >3 FB Neck ROM: Full    Dental  (+) Dental Advisory Given,    Pulmonary asthma , Current Smoker   breath sounds clear to auscultation       Cardiovascular hypertension, Pt. on medications (-) angina (-) Past MI and (-) CHF + Valvular Problems/Murmurs  Rhythm:Regular + Systolic Click 1. Left ventricular ejection fraction, by estimation, is 60 to 65%. Left  ventricular ejection fraction by 3D volume is 64 %. The left ventricle has  normal function. The left ventricle has no regional wall motion  abnormalities. Left ventricular diastolic   parameters were normal. The average left ventricular global longitudinal  strain is -22.2 %. The global longitudinal strain is normal.   2. Right ventricular systolic function is normal. The right ventricular  size is normal.   3. Left atrial size was mildly dilated.   4. The mitral valve is normal in structure. Mild mitral valve  regurgitation. No evidence of mitral stenosis.   5. The aortic valve has been repaired/replaced. Aortic valve  regurgitation is trivial. No aortic stenosis is present. There is a 23 mm  [employer] mechanical valve present in the aortic position.  Procedure Date: 11/03/17. Echo findings are  consistent with normal structure and function of the aortic valve  prosthesis. Aortic valve mean gradient measures 7.5 mmHg. Aortic valve  Vmax measures 1.87 m/s.   6. There is borderline dilatation of the aortic root, measuring 39 mm.   7. The inferior vena cava is normal in size with greater than 50%  respiratory variability, suggesting right atrial pressure of 3 mmHg.      Neuro/Psych  Headaches PSYCHIATRIC DISORDERS Anxiety  Bipolar Disorder      GI/Hepatic ,GERD  ,,Lab Results      Component                Value               Date                      ALT                      13                  07/09/2023                AST                      19                  07/09/2023                ALKPHOS                  60                  07/09/2023                BILITOT                  0.4  07/09/2023            Cholecystitis   Endo/Other  negative endocrine ROS    Renal/GU negative Renal ROS     Musculoskeletal  (+) Arthritis ,    Abdominal   Peds  Hematology Lab Results      Component                Value               Date                      WBC                      4.1                 07/09/2023                HGB                      13.8                07/09/2023                HCT                      41.2                07/09/2023                MCV                      93.6                07/09/2023                PLT                      93 (L)              07/09/2023            Coumadin held, heparin gtt for mechanical valve   Anesthesia Other Findings   Reproductive/Obstetrics                              Anesthesia Physical Anesthesia Plan  ASA: 3  Anesthesia Plan: General   Post-op Pain Management: Tylenol PO (pre-op)* and Ofirmev IV (intra-op)*   Induction: Intravenous  PONV Risk Score and Plan: 2 and Ondansetron and Dexamethasone  Airway Management Planned: Oral ETT  Additional Equipment: None  Intra-op Plan:   Post-operative Plan: Extubation in OR  Informed Consent: I have reviewed the patients History and Physical, chart, labs and discussed the procedure including the risks, benefits and alternatives for the proposed anesthesia with the patient or authorized representative who has indicated his/her understanding and acceptance.     Dental advisory  given  Plan Discussed with: CRNA  Anesthesia Plan Comments:          Anesthesia Quick Evaluation

## 2023-07-12 ENCOUNTER — Encounter (HOSPITAL_COMMUNITY): Payer: Self-pay | Admitting: Surgery

## 2023-07-12 ENCOUNTER — Ambulatory Visit: Payer: Managed Care, Other (non HMO) | Admitting: Internal Medicine

## 2023-07-12 DIAGNOSIS — K8000 Calculus of gallbladder with acute cholecystitis without obstruction: Secondary | ICD-10-CM | POA: Diagnosis not present

## 2023-07-12 MED ORDER — METHOCARBAMOL 750 MG PO TABS: 750.0000 mg | ORAL_TABLET | Freq: Three times a day (TID) | ORAL | Status: DC | PRN

## 2023-07-12 MED ORDER — MAGNESIUM CITRATE PO SOLN
0.5000 | Freq: Once | ORAL | Status: AC
  Administered 2023-07-12: 0.5 via ORAL
  Filled 2023-07-12 (×2): qty 296

## 2023-07-12 MED ORDER — MORPHINE SULFATE (PF) 2 MG/ML IV SOLN: 2.0000 mg | INTRAVENOUS | Status: DC | PRN

## 2023-07-12 MED ORDER — METHOCARBAMOL 750 MG PO TABS
750.0000 mg | ORAL_TABLET | Freq: Three times a day (TID) | ORAL | Status: DC
  Administered 2023-07-12 – 2023-07-15 (×10): 750 mg via ORAL
  Filled 2023-07-12 (×10): qty 1

## 2023-07-12 MED ORDER — WARFARIN - PHARMACIST DOSING INPATIENT: Freq: Every day | Status: DC

## 2023-07-12 MED ORDER — WARFARIN SODIUM 6 MG PO TABS
12.0000 mg | ORAL_TABLET | Freq: Once | ORAL | Status: AC
  Administered 2023-07-12: 12 mg via ORAL
  Filled 2023-07-12: qty 2

## 2023-07-12 MED ORDER — ACETAMINOPHEN 500 MG PO TABS
1000.0000 mg | ORAL_TABLET | Freq: Four times a day (QID) | ORAL | Status: DC
  Administered 2023-07-12 – 2023-07-15 (×10): 1000 mg via ORAL
  Filled 2023-07-12 (×12): qty 2

## 2023-07-12 MED ORDER — TRAMADOL HCL 50 MG PO TABS
100.0000 mg | ORAL_TABLET | Freq: Two times a day (BID) | ORAL | Status: DC
  Administered 2023-07-12 – 2023-07-15 (×7): 100 mg via ORAL
  Filled 2023-07-12 (×7): qty 2

## 2023-07-12 NOTE — Progress Notes (Addendum)
ANTICOAGULATION CONSULT NOTE  Pharmacy Consult for Heparin Indication: mechanical AVR  Allergies  Allergen Reactions   Demerol Nausea And Vomiting   Ibuprofen Other (See Comments)    Gastritis     Patient Measurements: Height: 4' 11.5" (151.1 cm) Weight: 51.3 kg (113 lb) IBW/kg (Calculated) : 44.35  Vital Signs: Temp: 98.2 F (36.8 C) (08/12 0716) Temp Source: Oral (08/12 0716) BP: 132/79 (08/12 0716) Pulse Rate: 67 (08/12 0716)  Labs: Recent Labs    07/10/23 0141 07/10/23 1431 07/10/23 2221 07/11/23 0128 07/11/23 1224 07/12/23 0324  HGB  --   --   --  14.2  --  12.2  HCT  --   --   --  43.2  --  37.7  PLT  --   --   --  92*  --  76*  LABPROT 17.1*  --   --   --  14.3 13.9  INR 1.4*  --   --   --  1.1 1.1  HEPARINUNFRC  --  0.16* 0.29*  --   --  0.37  CREATININE  --   --   --   --  0.90  --     Estimated Creatinine Clearance: 56.5 mL/min (by C-G formula based on SCr of 0.9 mg/dL).   Assessment: Danielle Obrien a 42 y.o. female presented with biliary colic, on warfarin PTA - currently on hold for anticipated procedure. Pharmacy has been consulted for heparin dosing.    Home warfarin dose:  7.5 mg Sun, MWF, 11.25 mg TTS  Heparin level therapeutic.  H/H WNL, platelet count trending down but no bleeding reported.  Goal of Therapy:  Heparin level 0.3-0.7 units/ml Monitor platelets by anticoagulation protocol: Yes   Plan:  Continue heparin gtt at 1000 units/hr Daily heparin level and CBC F/u with transitioning back to Coumadin   D. Laney Potash, PharmD, BCPS, BCCCP 07/12/2023, 9:13 AM  ==========================  Addendum: Resume Coumadin INR down to 1.1 (noted INR was as high as 4.2 on home Coumadin regimen) Coumadin 12mg  PO today Daily PT / INR Coumadin consider Lovenox bridge    D. Laney Potash, PharmD, BCPS, BCCCP 07/12/2023, 11:15 AM

## 2023-07-12 NOTE — Plan of Care (Signed)
  Problem: Nutrition: Goal: Adequate nutrition will be maintained Outcome: Progressing   Problem: Pain Managment: Goal: General experience of comfort will improve Outcome: Progressing   Problem: Coping: Goal: Level of anxiety will decrease Outcome: Progressing   Problem: Activity: Goal: Risk for activity intolerance will decrease Outcome: Progressing   Problem: Education: Goal: Knowledge of General Education information will improve Description: Including pain rating scale, medication(s)/side effects and non-pharmacologic comfort measures Outcome: Progressing

## 2023-07-12 NOTE — Progress Notes (Signed)
1 Day Post-Op  Subjective: CC: Patient reports pain around her incisions that is worse with movement.  Does not feel her pain is currently well-controlled.  Reports she takes 100 mg of Ultram twice daily at baseline.  No other areas of abdominal pain.  Tolerating regular diet without nausea or vomiting.  No worsening abdominal pain with p.o. intake.  Mobilizing in the room.  Voiding.  Passing flatus.  Reports she has been having issues with constipation for the last several weeks.  Does not want a suppository but is willing to try p.o. bowel regimen  Objective: Vital signs in last 24 hours: Temp:  [97.9 F (36.6 C)-98.2 F (36.8 C)] 98.2 F (36.8 C) (08/12 0716) Pulse Rate:  [66-76] 67 (08/12 0716) Resp:  [16] 16 (08/12 0716) BP: (116-137)/(73-87) 132/79 (08/12 0716) SpO2:  [91 %-97 %] 96 % (08/12 0716) Last BM Date :  (has not had bowel movement, on bowel regimen)  Intake/Output from previous day: 08/11 0701 - 08/12 0700 In: 3692.6 [I.V.:3692.6] Out: -  Intake/Output this shift: No intake/output data recorded.  PE: Gen:  Alert, NAD, pleasant Card:  Reg rate Pulm:  Rate and effort normal Abd: Soft, no distension, appropriately tender around laparoscopic incisions, no rigidity or guarding and otherwise NT, +BS. Incisions with glue intact appears well and are without drainage, bleeding, or signs of infection   Lab Results:  Recent Labs    07/11/23 0128 07/12/23 0324  WBC 4.7 5.6  HGB 14.2 12.2  HCT 43.2 37.7  PLT 92* 76*   BMET Recent Labs    07/11/23 1224  NA 139  K 4.0  CL 103  CO2 27  GLUCOSE 125*  BUN 5*  CREATININE 0.90  CALCIUM 8.6*   PT/INR Recent Labs    07/11/23 1224 07/12/23 0324  LABPROT 14.3 13.9  INR 1.1 1.1   CMP     Component Value Date/Time   NA 139 07/11/2023 1224   NA 144 11/24/2017 1218   K 4.0 07/11/2023 1224   CL 103 07/11/2023 1224   CO2 27 07/11/2023 1224   GLUCOSE 125 (H) 07/11/2023 1224   BUN 5 (L) 07/11/2023 1224    BUN 15 11/24/2017 1218   CREATININE 0.90 07/11/2023 1224   CALCIUM 8.6 (L) 07/11/2023 1224   PROT 5.9 (L) 07/11/2023 1224   ALBUMIN 3.5 07/11/2023 1224   AST 61 (H) 07/11/2023 1224   ALT 50 (H) 07/11/2023 1224   ALKPHOS 65 07/11/2023 1224   BILITOT 0.6 07/11/2023 1224   GFRNONAA >60 07/11/2023 1224   GFRAA >60 12/11/2018 0242   Lipase     Component Value Date/Time   LIPASE 28 07/07/2023 1613    Studies/Results: No results found.  Anti-infectives: Anti-infectives (From admission, onward)    Start     Dose/Rate Route Frequency Ordered Stop   07/11/23 0600  ceFAZolin (ANCEF) IVPB 2g/100 mL premix        2 g 200 mL/hr over 30 Minutes Intravenous  Once 07/10/23 1043 07/11/23 0548        Assessment/Plan POD 1 s/p Laparoscopic Cholecystectomy for Chronic cholecystitis by Dr. Fredricka Bonine on 07/11/23 - Cont Reg diet - Expect elevation of AST/ALT is from cauterization of the liver bed. Otherwise LFTs are reassuring - WBC wnl. Afebrile.  - Will work on pain control.  Restarted home Ultram. Scheduled Tylenol and Robaxin. Continue Oxy as needed. Wean IV pain medications - Mobilize. Pulm toilet - She may be able to be be discharged  as early as this afternoon from our standpoint if her pain is well controlled.   FEN - Reg diet. IVF per primary. Bowel regimen. VTE - SCDs, on heparin gtt. Okay to restart home Coumadin ID - Ancef peri-op. None currently.    LOS: 5 days    Jacinto Halim , [facility] 07/12/2023, 10:42 AM Please see Amion for pager number during day hours 7:00am-4:30pm

## 2023-07-12 NOTE — Progress Notes (Signed)
PROGRESS NOTE    Danielle Obrien  NUU:[id number] DOB: Mar 14, 1981 DOA: 07/07/2023 PCP: Etta Grandchild, MD   Brief Narrative:   42 y.o. female with medical history significant of essential hypertension, GERD, hyperlipidemia, ADD, asthma, history of aortic stenosis status post minimally invasive aortic valve replacement with bileaflet mechanical valve currently on chronic anticoagulation with warfarin, recent diagnosis of cholelithiasis on 06/25/2023 with outpatient general surgery evaluations and presented with worsening abdominal pain with vomiting.  General surgery was consulted and recommended admission for possible lap cholecystectomy.  On presentation, INR was 3.2: Patient was admitted under [facility] service.  She underwent laparoscopic cholecystectomy on 07/11/2023.  Assessment & Plan:   Biliary colic in a patient with recent diagnosis of cholelithiasis. -Status post laparoscopically cholecystectomy on 07/11/2023 by general surgery.  Wound care/pain management/diet advancement as per general surgery.   Mildly elevated LFTs -Possibly from above.  History of aortic stenosis status post minimally invasive aortic valve replacement with bileaflet mechanical valve currently on chronic anticoagulation with warfarin Supratherapeutic INR -INR 1.1 today.  Coumadin on hold.  Currently on heparin drip.  Switch to Coumadin once cleared by general surgery. -Outpatient follow-up with cardiology  Thrombocytopenia -Questionable cause.  Monitor.  No signs bleeding.  Essential hypertension -Blood pressure currently stable.  Monitor.  Continue metoprolol  Hyperlipidemia -continue statin  Bipolar disorder -Continue duloxetine  Mild persistent asthma -Currently stable.  Continue as needed albuterol  Tobacco abuse -Counseled by admitting hospitalist regarding cessation.  Continue nicotine patch.   DVT prophylaxis: Heparin drip  code Status: Full Family Communication: None at bedside Disposition  Plan: Status is: Inpatient Remains inpatient appropriate because: Of severity of illness    Consultants: General surgery  Procedures: As above  Antimicrobials: None   Subjective: Patient seen and examined at bedside.  Has intermittent abdominal pain.  Complains of constipation.  No chest pain, fever or shortness of breath reported.   Objective: Vitals:   07/11/23 2128 07/12/23 0011 07/12/23 0430 07/12/23 0716  BP: 124/79 137/81 116/73 132/79  Pulse: 76 69 66 67  Resp:    16  Temp: 98.2 F (36.8 C) 98.2 F (36.8 C) 98.2 F (36.8 C) 98.2 F (36.8 C)  TempSrc: Oral Oral Oral Oral  SpO2: 97% 96% 97% 96%  Weight:      Height:        Intake/Output Summary (Last 24 hours) at 07/12/2023 0831 Last data filed at 07/12/2023 0656 Gross per 24 hour  Intake 3692.61 ml  Output --  Net 3692.61 ml    Filed Weights   07/11/23 0701  Weight: 51.3 kg    Examination:  General: No acute distress.  Still on room air.   Respiratory: Bilateral decreased breath sounds at bases with scattered crackles CVS: S1 and S2 are heard; rate controlled currently abdominal: Soft, mild right upper quadrant tenderness present; distant mildly; no organomegaly; bowel sounds heard. extremities: No cyanosis; mild lower extremity edema present    Data Reviewed: I have personally reviewed following labs and imaging studies  CBC: Recent Labs  Lab 07/07/23 1613 07/08/23 0011 07/09/23 0042 07/11/23 0128 07/12/23 0324  WBC 5.0 4.6 4.1 4.7 5.6  HGB 14.6 14.7 13.8 14.2 12.2  HCT 43.2 43.9 41.2 43.2 37.7  MCV 90.9 93.6 93.6 95.8 93.5  PLT 103* 98* 93* 92* 76*   Basic Metabolic Panel: Recent Labs  Lab 07/07/23 1613 07/08/23 0011 07/09/23 0042 07/11/23 1224  NA 136 139 141 139  K 3.7 3.8 4.6 4.0  CL 106  104 107 103  CO2 21* 28 27 27   GLUCOSE 110* 105* 88 125*  BUN <5* <5* 5* 5*  CREATININE 0.99 0.89 0.85 0.90  CALCIUM 8.7* 8.7* 8.7* 8.6*  MG  --   --  2.1 1.8   GFR: Estimated  Creatinine Clearance: 56.5 mL/min (by C-G formula based on SCr of 0.9 mg/dL). Liver Function Tests: Recent Labs  Lab 07/07/23 1613 07/08/23 0011 07/09/23 0042 07/11/23 1224  AST 24 21 19  61*  ALT 14 14 13  50*  ALKPHOS 65 63 60 65  BILITOT 0.6 0.5 0.4 0.6  PROT 6.9 6.6 5.9* 5.9*  ALBUMIN 4.1 3.9 3.5 3.5   Recent Labs  Lab 07/07/23 1613  LIPASE 28   No results for input(s): "AMMONIA" in the last 168 hours. Coagulation Profile: Recent Labs  Lab 07/08/23 0011 07/09/23 0042 07/10/23 0141 07/11/23 1224 07/12/23 0324  INR 4.2* 3.2* 1.4* 1.1 1.1   Cardiac Enzymes: No results for input(s): "CKTOTAL", "CKMB", "CKMBINDEX", "TROPONINI" in the last 168 hours. BNP (last 3 results) No results for input(s): "PROBNP" in the last 8760 hours. HbA1C: No results for input(s): "HGBA1C" in the last 72 hours. CBG: No results for input(s): "GLUCAP" in the last 168 hours. Lipid Profile: No results for input(s): "CHOL", "HDL", "LDLCALC", "TRIG", "CHOLHDL", "LDLDIRECT" in the last 72 hours. Thyroid Function Tests: No results for input(s): "TSH", "T4TOTAL", "FREET4", "T3FREE", "THYROIDAB" in the last 72 hours. Anemia Panel: No results for input(s): "VITAMINB12", "FOLATE", "FERRITIN", "TIBC", "IRON", "RETICCTPCT" in the last 72 hours. Sepsis Labs: No results for input(s): "PROCALCITON", "LATICACIDVEN" in the last 168 hours.  Recent Results (from the past 240 hour(s))  MRSA Next Gen by PCR, Nasal     Status: None   Collection Time: 07/10/23 10:55 PM   Specimen: Nasal Mucosa; Nasal Swab  Result Value Ref Range Status   MRSA by PCR Next Gen NOT DETECTED NOT DETECTED Final    Comment: (NOTE) The GeneXpert MRSA Assay (FDA approved for NASAL specimens only), is one component of a comprehensive MRSA colonization surveillance program. It is not intended to diagnose MRSA infection nor to guide or monitor treatment for MRSA infections. Test performance is not FDA approved in patients less than 46  years old. Performed at [facility], [address], [place], [place] [address]          Radiology Studies: No results found.      Scheduled Meds:  acetaminophen  650 mg Oral Q6H   docusate sodium  100 mg Oral BID   DULoxetine  40 mg Oral Daily   gabapentin  600 mg Oral TID   lamoTRIgine  400 mg Oral Daily   metoprolol tartrate  12.5 mg Oral Daily   nicotine  14 mg Transdermal Daily   pantoprazole  40 mg Oral Daily   polyethylene glycol  17 g Oral BID   rosuvastatin  20 mg Oral Daily   tiZANidine  4 mg Oral QHS   Continuous Infusions:  heparin 1,000 Units/hr (07/12/23 0656)   lactated ringers Stopped (07/11/23 2440)          Glade Lloyd, MD [facility] 07/12/2023, 8:31 AM

## 2023-07-12 NOTE — Plan of Care (Signed)

## 2023-07-13 DIAGNOSIS — K8000 Calculus of gallbladder with acute cholecystitis without obstruction: Secondary | ICD-10-CM | POA: Diagnosis not present

## 2023-07-13 MED ORDER — OXYCODONE HCL 5 MG PO TABS: 5.0000 mg | ORAL_TABLET | Freq: Four times a day (QID) | ORAL | 0 refills | Status: DC | PRN

## 2023-07-13 MED ORDER — ENOXAPARIN SODIUM 60 MG/0.6ML IJ SOSY
50.0000 mg | PREFILLED_SYRINGE | Freq: Two times a day (BID) | INTRAMUSCULAR | Status: DC
  Administered 2023-07-13 – 2023-07-15 (×4): 50 mg via SUBCUTANEOUS
  Filled 2023-07-13 (×5): qty 0.6

## 2023-07-13 MED ORDER — WARFARIN SODIUM 7.5 MG PO TABS
15.0000 mg | ORAL_TABLET | Freq: Once | ORAL | Status: AC
  Administered 2023-07-13: 15 mg via ORAL
  Filled 2023-07-13: qty 2

## 2023-07-13 NOTE — Plan of Care (Signed)
  Problem: Pain Managment: Goal: General experience of comfort will improve Outcome: Progressing   Problem: Nutrition: Goal: Adequate nutrition will be maintained Outcome: Progressing   Problem: Coping: Goal: Level of anxiety will decrease Outcome: Progressing   Problem: Education: Goal: Knowledge of General Education information will improve Description: Including pain rating scale, medication(s)/side effects and non-pharmacologic comfort measures Outcome: Progressing

## 2023-07-13 NOTE — Progress Notes (Signed)
PROGRESS NOTE    Danielle Obrien  NWG:[id number] DOB: [birth year] DOA: 07/07/2023 PCP: Etta Grandchild, MD   Brief Narrative:   42 y.o. female with medical history significant of essential hypertension, GERD, hyperlipidemia, ADD, asthma, history of aortic stenosis status post minimally invasive aortic valve replacement with bileaflet mechanical valve currently on chronic anticoagulation with warfarin, recent diagnosis of cholelithiasis on 06/25/2023 with outpatient general surgery evaluations and presented with worsening abdominal pain with vomiting.  General surgery was consulted and recommended admission for possible lap cholecystectomy.  On presentation, INR was 3.2: Patient was admitted under [facility] service.  She underwent laparoscopic cholecystectomy on 07/11/2023.  Assessment & Plan:   Biliary colic in a patient with recent diagnosis of cholelithiasis. -Status post laparoscopically cholecystectomy on 07/11/2023 by general surgery.  Wound care/pain management/diet advancement as per general surgery.   Mildly elevated LFTs -Possibly from above.  History of aortic stenosis status post minimally invasive aortic valve replacement with bileaflet mechanical valve currently on chronic anticoagulation with warfarin Supratherapeutic INR -INR 1.1 today as well.  Coumadin resumed on 07/12/2023 after clearance by general surgery.  Currently on heparin drip.   -Outpatient follow-up with cardiology  Thrombocytopenia -Questionable cause.  Monitor.  No signs bleeding.  Essential hypertension -Blood pressure currently stable.  Monitor.  Continue metoprolol  Hyperlipidemia -continue statin  Bipolar disorder -Continue duloxetine  Mild persistent asthma -Currently stable.  Continue as needed albuterol  Tobacco abuse -Counseled by admitting hospitalist regarding cessation.  Continue nicotine patch.   DVT prophylaxis: Heparin drip.  Coumadin code Status: Full Family Communication: None at  bedside Disposition Plan: Status is: Inpatient Remains inpatient appropriate because: Of severity of illness.  INR still subtherapeutic    Consultants: General surgery  Procedures: As above  Antimicrobials: None   Subjective: Patient seen and examined at bedside.  Complains of intermittent abdominal pain.  No fever, chest pain, worsening shortness of breath reported.   Objective: Vitals:   07/12/23 1526 07/12/23 2105 07/13/23 0521 07/13/23 0823  BP: 133/83 (!) 153/92 129/81 136/82  Pulse: 70 65 64 64  Resp: 16   17  Temp: 98.1 F (36.7 C) 97.8 F (36.6 C) 97.9 F (36.6 C) 98 F (36.7 C)  TempSrc: Oral Oral Oral Oral  SpO2: 95% 99% 97% 98%  Weight:      Height:        Intake/Output Summary (Last 24 hours) at 07/13/2023 1022 Last data filed at 07/12/2023 1457 Gross per 24 hour  Intake 80.18 ml  Output --  Net 80.18 ml    Filed Weights   07/11/23 0701  Weight: 51.3 kg    Examination:  General: On room air.  No distress respiratory: Decreased breath sounds at bases bilaterally with some crackles  CVS: Rate mostly controlled; S1 and S2 heard  abdominal: Soft, mildly tender in the right upper quadrant; slightly distended; no organomegaly; normal bowel sounds heard extremities: No clubbing or cyanosis   Data Reviewed: I have personally reviewed following labs and imaging studies  CBC: Recent Labs  Lab 07/08/23 0011 07/09/23 0042 07/11/23 0128 07/12/23 0324 07/13/23 0322  WBC 4.6 4.1 4.7 5.6 4.3  HGB 14.7 13.8 14.2 12.2 13.2  HCT 43.9 41.2 43.2 37.7 38.4  MCV 93.6 93.6 95.8 93.5 93.0  PLT 98* 93* 92* 76* 70*   Basic Metabolic Panel: Recent Labs  Lab 07/07/23 1613 07/08/23 0011 07/09/23 0042 07/11/23 1224 07/13/23 0322  NA 136 139 141 139 138  K 3.7 3.8 4.6 4.0 3.7  CL 106 104 107 103 102  CO2 21* 28 27 27 29   GLUCOSE 110* 105* 88 125* 115*  BUN <5* <5* 5* 5* 5*  CREATININE 0.99 0.89 0.85 0.90 0.88  CALCIUM 8.7* 8.7* 8.7* 8.6* 8.6*  MG  --    --  2.1 1.8  --    GFR: Estimated Creatinine Clearance: 57.8 mL/min (by C-G formula based on SCr of 0.88 mg/dL). Liver Function Tests: Recent Labs  Lab 07/07/23 1613 07/08/23 0011 07/09/23 0042 07/11/23 1224 07/13/23 0322  AST 24 21 19  61* 51*  ALT 14 14 13  50* 46*  ALKPHOS 65 63 60 65 69  BILITOT 0.6 0.5 0.4 0.6 0.7  PROT 6.9 6.6 5.9* 5.9* 5.8*  ALBUMIN 4.1 3.9 3.5 3.5 3.4*   Recent Labs  Lab 07/07/23 1613  LIPASE 28   No results for input(s): "AMMONIA" in the last 168 hours. Coagulation Profile: Recent Labs  Lab 07/09/23 0042 07/10/23 0141 07/11/23 1224 07/12/23 0324 07/13/23 0322  INR 3.2* 1.4* 1.1 1.1 1.1   Cardiac Enzymes: No results for input(s): "CKTOTAL", "CKMB", "CKMBINDEX", "TROPONINI" in the last 168 hours. BNP (last 3 results) No results for input(s): "PROBNP" in the last 8760 hours. HbA1C: No results for input(s): "HGBA1C" in the last 72 hours. CBG: No results for input(s): "GLUCAP" in the last 168 hours. Lipid Profile: No results for input(s): "CHOL", "HDL", "LDLCALC", "TRIG", "CHOLHDL", "LDLDIRECT" in the last 72 hours. Thyroid Function Tests: No results for input(s): "TSH", "T4TOTAL", "FREET4", "T3FREE", "THYROIDAB" in the last 72 hours. Anemia Panel: No results for input(s): "VITAMINB12", "FOLATE", "FERRITIN", "TIBC", "IRON", "RETICCTPCT" in the last 72 hours. Sepsis Labs: No results for input(s): "PROCALCITON", "LATICACIDVEN" in the last 168 hours.  Recent Results (from the past 240 hour(s))  MRSA Next Gen by PCR, Nasal     Status: None   Collection Time: 07/10/23 10:55 PM   Specimen: Nasal Mucosa; Nasal Swab  Result Value Ref Range Status   MRSA by PCR Next Gen NOT DETECTED NOT DETECTED Final    Comment: (NOTE) The GeneXpert MRSA Assay (FDA approved for NASAL specimens only), is one component of a comprehensive MRSA colonization surveillance program. It is not intended to diagnose MRSA infection nor to guide or monitor treatment for  MRSA infections. Test performance is not FDA approved in patients less than 46 years old. Performed at [facility], [address] [place], [place] [address]          Radiology Studies: No results found.      Scheduled Meds:  acetaminophen  1,000 mg Oral Q6H   docusate sodium  100 mg Oral BID   DULoxetine  40 mg Oral Daily   enoxaparin (LOVENOX) injection  50 mg Subcutaneous BID   gabapentin  600 mg Oral TID   lamoTRIgine  400 mg Oral Daily   methocarbamol  750 mg Oral TID   metoprolol tartrate  12.5 mg Oral Daily   nicotine  14 mg Transdermal Daily   pantoprazole  40 mg Oral Daily   polyethylene glycol  17 g Oral BID   rosuvastatin  20 mg Oral Daily   traMADol  100 mg Oral BID   warfarin  15 mg Oral ONCE-1600   Warfarin - Pharmacist Dosing Inpatient   Does not apply q1600   Continuous Infusions:  heparin 1,000 Units/hr (07/12/23 2055)          Glade Lloyd, MD [facility] 07/13/2023, 10:22 AM

## 2023-07-13 NOTE — Progress Notes (Signed)
ANTICOAGULATION CONSULT NOTE  Pharmacy Consult for Heparin / Coumadin Indication: mechanical AVR  Allergies  Allergen Reactions   Demerol Nausea And Vomiting   Ibuprofen Other (See Comments)    Gastritis     Patient Measurements: Height: 4' 11.5" (151.1 cm) Weight: 51.3 kg (113 lb) IBW/kg (Calculated) : 44.35  Vital Signs: Temp: 98 F (36.7 C) (08/13 0823) Temp Source: Oral (08/13 0823) BP: 136/82 (08/13 0823) Pulse Rate: 64 (08/13 0823)  Labs: Recent Labs    07/10/23 2221 07/11/23 0128 07/11/23 0128 07/11/23 1224 07/12/23 0324 07/13/23 0315 07/13/23 0322  HGB  --  14.2   < >  --  12.2  --  13.2  HCT  --  43.2  --   --  37.7  --  38.4  PLT  --  92*  --   --  76*  --  70*  LABPROT  --   --   --  14.3 13.9  --  14.1  INR  --   --   --  1.1 1.1  --  1.1  HEPARINUNFRC 0.29*  --   --   --  0.37 0.45  --   CREATININE  --   --   --  0.90  --   --  0.88   < > = values in this interval not displayed.    Estimated Creatinine Clearance: 57.8 mL/min (by C-G formula based on SCr of 0.88 mg/dL).   Assessment: Danielle Obrien a 42 y.o. female presented with biliary colic, on warfarin PTA - currently on hold for anticipated procedure. Pharmacy has been consulted for heparin dosing.  Coumadin resumed on 8/12.   Home warfarin dose:  7.5 mg Sun, MWF, 11.25 mg TTS  Heparin level therapeutic and INR remain low as expected.  H/H WNL, platelet count trending down but no bleeding reported.  Goal of Therapy:  Heparin level 0.3-0.7 units/ml Monitor platelets by anticoagulation protocol: Yes   Plan:  Continue heparin gtt at 1000 units/hr Coumadin 15mg  PO today Daily heparin level, PT / INR and CBC Could consider Lovenox bridge    D. Laney Potash, PharmD, BCPS, BCCCP 07/13/2023, 9:33 AM

## 2023-07-13 NOTE — Discharge Instructions (Signed)
[facility]  Please arrive at least 30 min before your appointment to complete your check in paperwork.  If you are unable to arrive 30 min prior to your appointment time we may have to cancel or reschedule you. LAPAROSCOPIC SURGERY: POST OP INSTRUCTIONS Always review your discharge instruction sheet given to you by the facility where your surgery was performed. IF YOU HAVE DISABILITY OR FAMILY LEAVE FORMS, YOU MUST BRING THEM TO THE OFFICE FOR PROCESSING.   DO NOT GIVE THEM TO YOUR DOCTOR.  PAIN CONTROL  First take acetaminophen (Tylenol) AND/or ibuprofen (Advil) to control your pain after surgery.  Follow directions on package.  Taking acetaminophen (Tylenol) and/or ibuprofen (Advil) regularly after surgery will help to control your pain and lower the amount of prescription pain medication you may need.  You should not take more than 4,000 mg (4 grams) of acetaminophen (Tylenol) in 24 hours.  You should not take ibuprofen (Advil), aleve, motrin, naprosyn or other NSAIDS if you have a history of stomach ulcers or chronic kidney disease.  A prescription for pain medication may be given to you upon discharge.  Take your pain medication as prescribed, if you still have uncontrolled pain after taking acetaminophen (Tylenol) or ibuprofen (Advil). Use ice packs to help control pain. If you need a refill on your pain medication, please contact your pharmacy.  They will contact our office to request authorization. Prescriptions will not be filled after 5pm or on week-ends.  HOME MEDICATIONS Take your usually prescribed medications unless otherwise directed.  DIET You should follow a light diet the first few days after arrival home.  Be sure to include lots of fluids daily. Avoid fatty, fried foods.   CONSTIPATION It is common to experience some constipation after surgery and if you are taking pain medication.  Increasing fluid intake and taking a stool softener (such as Colace)  will usually help or prevent this problem from occurring.  A mild laxative (Milk of Magnesia or Miralax) should be taken according to package instructions if there are no bowel movements after 48 hours.  WOUND/INCISION CARE Most patients will experience some swelling and bruising in the area of the incisions.  Ice packs will help.  Swelling and bruising can take several days to resolve.  Unless discharge instructions indicate otherwise, follow guidelines below  STERI-STRIPS - you may remove your outer bandages 48 hours after surgery, and you may shower at that time.  You have steri-strips (small skin tapes) in place directly over the incision.  These strips should be left on the skin for 7-10 days.   DERMABOND/SKIN GLUE - you may shower in 24 hours.  The glue will flake off over the next 2-3 weeks. Any sutures or staples will be removed at the office during your follow-up visit.  ACTIVITIES You may resume regular (light) daily activities beginning the next day--such as daily self-care, walking, climbing stairs--gradually increasing activities as tolerated.  You may have sexual intercourse when it is comfortable.  Refrain from any heavy lifting or straining until approved by your doctor. You may drive when you are no longer taking prescription pain medication, you can comfortably wear a seatbelt, and you can safely maneuver your car and apply brakes.  FOLLOW-UP You should see your doctor in the office for a follow-up appointment approximately 2-3 weeks after your surgery.  You should have been given your post-op/follow-up appointment when your surgery was scheduled.  If you did not receive a post-op/follow-up appointment, make sure   that you call for this appointment within a day or two after you arrive home to insure a convenient appointment time.   WHEN TO CALL YOUR DOCTOR: Fever over 101.0 Inability to urinate Continued bleeding from incision. Increased pain, redness, or drainage from the  incision. Increasing abdominal pain  The clinic staff is available to answer your questions during regular business hours.  Please don't hesitate to call and ask to speak to one of the nurses for clinical concerns.  If you have a medical emergency, go to the nearest emergency room or call 911.  A surgeon from [facility] is always on call at the hospital. [address], [address], [place], [place]  [address] ? [address] [address], [place], [place]   [address] [phone] ? [phone] ? FAX [phone]  

## 2023-07-13 NOTE — Plan of Care (Signed)
Patient was provided teaching and demonstrated understanding by self-administering \\her  Lovenox injection.    Problem: Education: Goal: Knowledge of General Education information will improve Description: Including pain rating scale, medication(s)/side effects and non-pharmacologic comfort measures Outcome: Progressing   Problem: Health Behavior/Discharge Planning: Goal: Ability to manage health-related needs will improve Outcome: Progressing   Problem: Clinical Measurements: Goal: Ability to maintain clinical measurements within normal limits will improve Outcome: Progressing Goal: Will remain free from infection Outcome: Progressing Goal: Diagnostic test results will improve Outcome: Progressing Goal: Respiratory complications will improve Outcome: Progressing Goal: Cardiovascular complication will be avoided Outcome: Progressing   Problem: Activity: Goal: Risk for activity intolerance will decrease Outcome: Progressing   Problem: Nutrition: Goal: Adequate nutrition will be maintained Outcome: Progressing   Problem: Coping: Goal: Level of anxiety will decrease Outcome: Progressing   Problem: Elimination: Goal: Will not experience complications related to bowel motility Outcome: Progressing Goal: Will not experience complications related to urinary retention Outcome: Progressing   Problem: Pain Managment: Goal: General experience of comfort will improve Outcome: Progressing   Problem: Safety: Goal: Ability to remain free from injury will improve Outcome: Progressing   Problem: Skin Integrity: Goal: Risk for impaired skin integrity will decrease Outcome: Progressing     Tamera Stands, RN

## 2023-07-13 NOTE — Progress Notes (Addendum)
2 Days Post-Op  Subjective: CC: Continued pain around her incisions but feels this is better controlled today with the changes that were made yesterday. No longer requiring IV pain medication. Tolerating regular diet without n/v. BM yesterday. Voiding. Mobilizing.   Objective: Vital signs in last 24 hours: Temp:  [97.8 F (36.6 C)-98.1 F (36.7 C)] 98 F (36.7 C) (08/13 0823) Pulse Rate:  [64-70] 64 (08/13 0823) Resp:  [16-17] 17 (08/13 0823) BP: (129-153)/(81-92) 136/82 (08/13 0823) SpO2:  [95 %-99 %] 98 % (08/13 0823) Last BM Date :  (has not had bowel movement, on bowel regimen)  Intake/Output from previous day: 08/12 0701 - 08/13 0700 In: 80.2 [I.V.:80.2] Out: -  Intake/Output this shift: No intake/output data recorded.  PE: Gen:  Alert, NAD, pleasant Card:  Reg rate Pulm:  Rate and effort normal Abd: Soft, no distension, appropriately tender around laparoscopic incisions, no rigidity or guarding and otherwise NT, +BS. Incisions with glue intact appears well and are without drainage, bleeding, or signs of infection   Lab Results:  Recent Labs    07/12/23 0324 07/13/23 0322  WBC 5.6 4.3  HGB 12.2 13.2  HCT 37.7 38.4  PLT 76* 70*   BMET Recent Labs    07/11/23 1224 07/13/23 0322  NA 139 138  K 4.0 3.7  CL 103 102  CO2 27 29  GLUCOSE 125* 115*  BUN 5* 5*  CREATININE 0.90 0.88  CALCIUM 8.6* 8.6*   PT/INR Recent Labs    07/12/23 0324 07/13/23 0322  LABPROT 13.9 14.1  INR 1.1 1.1   CMP     Component Value Date/Time   NA 138 07/13/2023 0322   NA 144 11/24/2017 1218   K 3.7 07/13/2023 0322   CL 102 07/13/2023 0322   CO2 29 07/13/2023 0322   GLUCOSE 115 (H) 07/13/2023 0322   BUN 5 (L) 07/13/2023 0322   BUN 15 11/24/2017 1218   CREATININE 0.88 07/13/2023 0322   CALCIUM 8.6 (L) 07/13/2023 0322   PROT 5.8 (L) 07/13/2023 0322   ALBUMIN 3.4 (L) 07/13/2023 0322   AST 51 (H) 07/13/2023 0322   ALT 46 (H) 07/13/2023 0322   ALKPHOS 69 07/13/2023  0322   BILITOT 0.7 07/13/2023 0322   GFRNONAA >60 07/13/2023 0322   GFRAA >60 12/11/2018 0242   Lipase     Component Value Date/Time   LIPASE 28 07/07/2023 1613    Studies/Results: No results found.  Anti-infectives: Anti-infectives (From admission, onward)    Start     Dose/Rate Route Frequency Ordered Stop   07/11/23 0600  ceFAZolin (ANCEF) IVPB 2g/100 mL premix        2 g 200 mL/hr over 30 Minutes Intravenous  Once 07/10/23 1043 07/11/23 0548        Assessment/Plan POD 2 s/p Laparoscopic Cholecystectomy for Chronic cholecystitis by Dr. Fredricka Bonine on 07/11/23 - Patient is tolerating diet, ambulating, pain controlled with po medications, vital signs stable (no fever, tachycardia or hypotension), WBC wnl, LFT's downtrending, voiding, incisions c/d/i and felt stable for discharge home from our standpoint. Will arrange f/u. Discussed discharge instructions, restrictions and return/call back precautions. Patient is on chronic pain medication and reports she has a pain contract with Dr. 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 I did reach out and speak to Dr. Antoine Primas directly who confirmed it was okay to prescribed her a short supply of Oxycodone at d/c. Will send a message to [facility] to let them know that patient is okay  for d/c from our standpoint. If patient remains inpatient, we will plan to follow peripherally moving forward. Please call back with any questions or concerns.   FEN - Reg diet. IVF per primary. Cont bowel regimen. VTE - SCDs, Coumadin  ID - Ancef peri-op. None currently. Afebrile. WBC wnl.    LOS: 6 days    Jacinto Halim , [facility] 07/13/2023, 10:14 AM Please see Amion for pager number during day hours 7:00am-4:30pm

## 2023-07-14 DIAGNOSIS — K8000 Calculus of gallbladder with acute cholecystitis without obstruction: Secondary | ICD-10-CM | POA: Diagnosis not present

## 2023-07-14 LAB — CBC
HCT: 40.9 % (ref 36.0–46.0)
Hemoglobin: 14.4 g/dL (ref 12.0–15.0)
MCH: 32.1 pg (ref 26.0–34.0)
MCHC: 35.2 g/dL (ref 30.0–36.0)
MCV: 91.3 fL (ref 80.0–100.0)
Platelets: 86 10*3/uL — ABNORMAL LOW (ref 150–400)
RBC: 4.48 MIL/uL (ref 3.87–5.11)
RDW: 12.2 % (ref 11.5–15.5)
WBC: 4.8 10*3/uL (ref 4.0–10.5)
nRBC: 0 % (ref 0.0–0.2)

## 2023-07-14 LAB — GLUCOSE, CAPILLARY: Glucose-Capillary: 113 mg/dL — ABNORMAL HIGH (ref 70–99)

## 2023-07-14 MED ORDER — WARFARIN SODIUM 7.5 MG PO TABS
7.5000 mg | ORAL_TABLET | Freq: Once | ORAL | Status: AC
  Administered 2023-07-14: 7.5 mg via ORAL
  Filled 2023-07-14: qty 1

## 2023-07-14 NOTE — Progress Notes (Signed)
ANTICOAGULATION CONSULT NOTE  Pharmacy Consult:  Coumadin Indication: mechanical AVR  Allergies  Allergen Reactions   Demerol Nausea And Vomiting   Ibuprofen Other (See Comments)    Gastritis     Patient Measurements: Height: 4' 11.5" (151.1 cm) Weight: 51.3 kg (113 lb) IBW/kg (Calculated) : 44.35  Vital Signs: Temp: 98.2 F (36.8 C) (08/14 0413) Temp Source: Oral (08/14 0413) BP: 150/82 (08/14 0413) Pulse Rate: 60 (08/14 0413)  Labs: Recent Labs    07/11/23 1224 07/12/23 0324 07/13/23 0315 07/13/23 0322 07/14/23 0318  HGB  --  12.2  --  13.2  --   HCT  --  37.7  --  38.4  --   PLT  --  76*  --  70*  --   LABPROT 14.3 13.9  --  14.1 18.0*  INR 1.1 1.1  --  1.1 1.5*  HEPARINUNFRC  --  0.37 0.45  --   --   CREATININE 0.90  --   --  0.88 0.79    Estimated Creatinine Clearance: 63.6 mL/min (by C-G formula based on SCr of 0.79 mg/dL).   Assessment: Danielle Obrien a 42 y.o. female presented with biliary colic, on warfarin PTA - currently on hold for anticipated procedure. Pharmacy has been consulted for heparin dosing, which has been transitioned to Lovenox on 8/13.  Coumadin resumed on 8/12.   Home warfarin dose:  7.5 mg Sun, MWF, 11.25 mg TTS Noted INR was supratherapeutic on admission on home regimen  INR is starting to trend up; no bleeding reported.  Did not eat well yesterday per charting.  Goal of Therapy:  Heparin level 0.3-0.7 units/ml Monitor platelets by anticoagulation protocol: Yes   Plan:  Lovenox 50mg  SQ BID per MD - recommend stopping when INR >/= 2  Try Coumadin 7.5mg  PO today CBC Q72H, daily PT / INR   D. Laney Potash, PharmD, BCPS, BCCCP 07/14/2023, 7:10 AM

## 2023-07-14 NOTE — Plan of Care (Signed)

## 2023-07-14 NOTE — Progress Notes (Signed)
[facility] PROGRESS NOTE  TRUDITH FORK (DOB: December 29, 1980) UJW:[id number] PCP: Etta Grandchild, MD  Brief Narrative: Danielle Obrien is a 42 y.o. female with a history of AS s/p mechanical AVR on coumadin, HTN, HLD, thrombocytopenia, bipolar disorder, mild asthma, tobacco use, GERD and recent diagnosis of cholelithiasis on 06/25/2023 with plans for outpatient general surgery evaluation who presented to the ED 8/7 with worsening abdominal pain with vomiting. She was admitted and underwent laparoscopic cholecystectomy 8/11. INR at presentation was 4.2. Coumadin held and has been restarted with heparin, and now lovenox, bridging. INR 1.5 on 8/14. Platelet count was 98 on presentation, down to 70 on 8/12 and remains < 100k.  Subjective: No bleeding. Abd pain controlled, tolerating liquids.   Objective: BP (!) 146/92 (BP Location: Right Arm)   Pulse 65   Temp 98.6 F (37 C) (Oral)   Resp 18   Ht 4' 11.5" (1.511 m)   Wt 51.3 kg   SpO2 98%   BMI 22.44 kg/m   Gen: No distress Pulm: Clear, nonlabored  CV: RRR, no MRG or edema GI: Soft, appropriately tender with laparoscopic incision sites c/d/I with dermabond. No distention. +BS.  Neuro: Alert and oriented. No new focal deficits. Ext: Warm, no deformities. Skin: No other rashes, lesions or ulcers on visualized skin   Assessment & Plan: Principal Problem:   Cholelithiasis and acute cholecystitis without obstruction Active Problems:   Mild persistent asthma   Essential hypertension, benign   Hyperlipidemia with target LDL less than 130   Bipolar disorder (HCC)   Tobacco abuse   Long term (current) use of anticoagulants [Z79.01]   Biliary colic  Acute cholecystitis, symptomatic cholelithiasis: s/p lap chole 8/11.  - General surgery follow up recommended - Recheck LFTs at follow up.   AS s/p mechanical AVR:  - Continue lovenox bridge and dose coumadin tonight, recheck INR in AM.  - Anticipate DC with slightly less total  weekly coumadin dose as she presented with supratherapeutic INR in setting of suboptimal nutritional intake. - Follow up with coumadin clinic is already secured 8/16.   Thrombocytopenia: Has had lower platelets in the past with unclear etiology. - Recheck today shows platelets may have reached nadir. Since the patient requires bridging anticoagulation in the postoperative period, we will confirm platelet count continues rising, ideally >100k prior to discharge. 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 - Recheck at follow up recommended, possibly with hematology evaluation if persistent.  HTN:  - Continue metoprolol  Bipolar disorder: quiescent.  - Continue duloxetine  Asthma: Quiescent.  - Continue prn albuterol  Tobacco use:  - Cessation counseling  Tyrone Nine, MD [facility] [email or web address] 07/14/2023, 4:29 PM

## 2023-07-14 NOTE — Progress Notes (Signed)
   07/14/23 1245  TOC Brief Assessment  Insurance and Status Reviewed  Patient has primary care physician Yes  Prior level of function: independent  Prior/Current Home Services No current home services  Social Determinants of Health Reivew SDOH reviewed no interventions necessary  Readmission risk has been reviewed Yes  Transition of care needs no transition of care needs at this time

## 2023-07-15 ENCOUNTER — Telehealth: Payer: Self-pay

## 2023-07-15 ENCOUNTER — Other Ambulatory Visit (HOSPITAL_COMMUNITY): Payer: Self-pay

## 2023-07-15 DIAGNOSIS — K8000 Calculus of gallbladder with acute cholecystitis without obstruction: Secondary | ICD-10-CM | POA: Diagnosis not present

## 2023-07-15 LAB — CBC
HCT: 44.5 % (ref 36.0–46.0)
Hemoglobin: 14.8 g/dL (ref 12.0–15.0)
MCH: 30.6 pg (ref 26.0–34.0)
MCHC: 33.3 g/dL (ref 30.0–36.0)
MCV: 92.1 fL (ref 80.0–100.0)
Platelets: 84 10*3/uL — ABNORMAL LOW (ref 150–400)
RBC: 4.83 MIL/uL (ref 3.87–5.11)
RDW: 12.3 % (ref 11.5–15.5)
WBC: 4.6 10*3/uL (ref 4.0–10.5)
nRBC: 0 % (ref 0.0–0.2)

## 2023-07-15 LAB — PROTIME-INR
INR: 3.2 — ABNORMAL HIGH (ref 0.8–1.2)
Prothrombin Time: 33.2 s — ABNORMAL HIGH (ref 11.4–15.2)

## 2023-07-15 LAB — IMMATURE PLATELET FRACTION: Immature Platelet Fraction: 13.6 % — ABNORMAL HIGH (ref 1.2–8.6)

## 2023-07-15 MED ORDER — ENOXAPARIN SODIUM 60 MG/0.6ML IJ SOSY
50.0000 mg | PREFILLED_SYRINGE | Freq: Two times a day (BID) | INTRAMUSCULAR | 0 refills | Status: DC
Start: 2023-07-15 — End: 2023-08-03
  Filled 2023-07-15: qty 2.4, 2d supply, fill #0

## 2023-07-15 NOTE — Plan of Care (Signed)
  Problem: Education: Goal: Knowledge of General Education information will improve Description: Including pain rating scale, medication(s)/side effects and non-pharmacologic comfort measures 07/15/2023 1039 by Sheppard Evens, RN Outcome: Adequate for Discharge 07/15/2023 1038 by Sheppard Evens, RN Outcome: Adequate for Discharge   Problem: Health Behavior/Discharge Planning: Goal: Ability to manage health-related needs will improve 07/15/2023 1039 by Sheppard Evens, RN Outcome: Adequate for Discharge 07/15/2023 1038 by Sheppard Evens, RN Outcome: Adequate for Discharge   Problem: Clinical Measurements: Goal: Ability to maintain clinical measurements within normal limits will improve 07/15/2023 1039 by Sheppard Evens, RN Outcome: Adequate for Discharge 07/15/2023 1038 by Sheppard Evens, RN Outcome: Adequate for Discharge Goal: Will remain free from infection 07/15/2023 1039 by Sheppard Evens, RN Outcome: Adequate for Discharge 07/15/2023 1038 by Sheppard Evens, RN Outcome: Progressing Goal: Diagnostic test results will improve 07/15/2023 1039 by Sheppard Evens, RN Outcome: Adequate for Discharge 07/15/2023 1038 by Sheppard Evens, RN Outcome: Adequate for Discharge Goal: Respiratory complications will improve 07/15/2023 1039 by Sheppard Evens, RN Outcome: Adequate for Discharge 07/15/2023 1038 by Sheppard Evens, RN Outcome: Adequate for Discharge Goal: Cardiovascular complication will be avoided 07/15/2023 1039 by Sheppard Evens, RN Outcome: Adequate for Discharge 07/15/2023 1038 by Sheppard Evens, RN Outcome: Adequate for Discharge   Problem: Activity: Goal: Risk for activity intolerance will decrease 07/15/2023 1039 by Sheppard Evens, RN Outcome: Adequate for Discharge 07/15/2023 1038 by Sheppard Evens, RN Outcome: Adequate for Discharge   Problem: Nutrition: Goal: Adequate nutrition will be maintained 07/15/2023 1039 by Sheppard Evens,  RN Outcome: Adequate for Discharge 07/15/2023 1038 by Sheppard Evens, RN Outcome: Adequate for Discharge   Problem: Coping: Goal: Level of anxiety will decrease 07/15/2023 1039 by Sheppard Evens, RN Outcome: Adequate for Discharge 07/15/2023 1038 by Sheppard Evens, RN Outcome: Adequate for Discharge   Problem: Elimination: Goal: Will not experience complications related to bowel motility 07/15/2023 1039 by Sheppard Evens, RN Outcome: Adequate for Discharge 07/15/2023 1038 by Sheppard Evens, RN Outcome: Adequate for Discharge Goal: Will not experience complications related to urinary retention 07/15/2023 1039 by Sheppard Evens, RN Outcome: Adequate for Discharge 07/15/2023 1038 by Sheppard Evens, RN Outcome: Adequate for Discharge   Problem: Pain Managment: Goal: General experience of comfort will improve 07/15/2023 1039 by Sheppard Evens, RN Outcome: Adequate for Discharge 07/15/2023 1038 by Sheppard Evens, RN Outcome: Not Progressing   Problem: Safety: Goal: Ability to remain free from injury will improve 07/15/2023 1039 by Sheppard Evens, RN Outcome: Adequate for Discharge 07/15/2023 1038 by Sheppard Evens, RN Outcome: Not Progressing   Problem: Skin Integrity: Goal: Risk for impaired skin integrity will decrease 07/15/2023 1039 by Sheppard Evens, RN Outcome: Adequate for Discharge 07/15/2023 1038 by Sheppard Evens, RN Outcome: Not Progressing

## 2023-07-15 NOTE — Telephone Encounter (Signed)
Pt called office stating she is currently in the hospital, has been for 9 days. She called to reschedule her appointment with Dr.Tucker on 8/16. Next available appointment is 9/26 @ 8:30  Pt agreed to date and time.

## 2023-07-15 NOTE — Plan of Care (Signed)
  Problem: Clinical Measurements: Goal: Will remain free from infection Outcome: Progressing   Problem: Pain Managment: Goal: General experience of comfort will improve Outcome: Not Progressing   Problem: Safety: Goal: Ability to remain free from injury will improve Outcome: Not Progressing   Problem: Skin Integrity: Goal: Risk for impaired skin integrity will decrease Outcome: Not Progressing   Problem: Education: Goal: Knowledge of General Education information will improve Description: Including pain rating scale, medication(s)/side effects and non-pharmacologic comfort measures Outcome: Adequate for Discharge   Problem: Health Behavior/Discharge Planning: Goal: Ability to manage health-related needs will improve Outcome: Adequate for Discharge   Problem: Clinical Measurements: Goal: Ability to maintain clinical measurements within normal limits will improve Outcome: Adequate for Discharge Goal: Diagnostic test results will improve Outcome: Adequate for Discharge Goal: Respiratory complications will improve Outcome: Adequate for Discharge Goal: Cardiovascular complication will be avoided Outcome: Adequate for Discharge   Problem: Activity: Goal: Risk for activity intolerance will decrease Outcome: Adequate for Discharge   Problem: Nutrition: Goal: Adequate nutrition will be maintained Outcome: Adequate for Discharge   Problem: Coping: Goal: Level of anxiety will decrease Outcome: Adequate for Discharge   Problem: Elimination: Goal: Will not experience complications related to bowel motility Outcome: Adequate for Discharge Goal: Will not experience complications related to urinary retention Outcome: Adequate for Discharge

## 2023-07-15 NOTE — Discharge Summary (Signed)
Physician Discharge Summary   Patient: Danielle Obrien MRN: [id number] DOB: December 08, 1980  Admit date:     07/07/2023  Discharge date: 07/15/23  Discharge Physician: Tyrone Nine   PCP: Etta Grandchild, MD   Recommendations at discharge:  Follow up with general surgery as scheduled for postop (lap chole) management.Consider recheck CMP here or per PCP. Follow up with coumadin clinic in 24 hours (8/16 at 10:00am). Discharged on lovenox bridge having restarted coumadin, DC INR is 3.2.  Follow up with Dr. Pricilla Holm as scheduled 8/16 Follow up with Dr. Yetta Barre in 1-2 weeks with plans to repeat CBC. If thrombocytopenia is persistent, would consider deescalating possible contributing medications (e.g. lamictal, duloxetine, gabapentin).   Discharge Diagnoses: Principal Problem:   Cholelithiasis and acute cholecystitis without obstruction Active Problems:   Mild persistent asthma   Essential hypertension, benign   Hyperlipidemia with target LDL less than 130   Bipolar disorder (HCC)   Tobacco abuse   Long term (current) use of anticoagulants [Z79.01]   Biliary colic  Hospital Course: Danielle Obrien is a 42 y.o. female with a history of AS s/p mechanical AVR on coumadin, HTN, HLD, thrombocytopenia, bipolar disorder, mild asthma, tobacco use, GERD and recent diagnosis of cholelithiasis on 06/25/2023 with plans for outpatient general surgery evaluation who presented to the ED 8/7 with worsening abdominal pain with vomiting. She was admitted and underwent laparoscopic cholecystectomy 8/11. INR at presentation was 4.2. Coumadin held and has been restarted with heparin, and now lovenox, bridging. INR 1.5 on 8/14. Platelet count was 98 on presentation, down to 70 on 8/12 and has shown slow rebound since that time. She is stable for discharge on 8/15.  Assessment and Plan: Acute cholecystitis, symptomatic cholelithiasis: s/p lap chole 8/11.  - General surgery follow up recommended - Recheck LFTs at follow  up.    AS s/p mechanical AVR:  - Continue lovenox bridge and coumadin (hold tonight's dose and until after coumadin clinic visit tmrw AM). INR 3.2 8/15.    Thrombocytopenia: Has had lower platelets in the past, discussed with hematology, Dr. Candise Che who suspects medication-related, particularly high dose lamictal, also gabapentin and duloxetine possible offenders. The risk of mild thrombocytopenia from these chronic medications is outweighed by their benefit at this time. Recommend PCP and/or psychiatry reevaluation as indicated. No routine hematology follow up currently scheduled.    HTN:  - Continue metoprolol   Bipolar disorder: quiescent.  - Continue duloxetine   Asthma: Quiescent.  - Continue prn albuterol   Tobacco use:  - Cessation counseling  Consultants: General surgery, curbside hematology consult with Dr. Candise Che on 8/15. Procedures performed: Laparoscopic cholecystectomy 8/11 Disposition: Home Diet recommendation: Regular DISCHARGE MEDICATION: Allergies as of 07/15/2023       Reactions   Demerol Nausea And Vomiting   Ibuprofen Other (See Comments)   Gastritis        Medication List     STOP taking these medications    aspirin EC 81 MG tablet       TAKE these medications    albuterol 108 (90 Base) MCG/ACT inhaler Commonly known as: VENTOLIN HFA Inhale 2 puffs into the lungs every 6 (six) hours as needed for wheezing or shortness of breath.   DULoxetine 20 MG capsule Commonly known as: CYMBALTA TAKE 2 CAPSULES BY MOUTH DAILY   enoxaparin 60 MG/0.6ML injection Commonly known as: LOVENOX Inject 0.5 mLs (50 mg total) into the skin 2 (two) times daily for 2 days.   gabapentin 300 MG  capsule Commonly known as: NEURONTIN Take 2 capsules (600 mg total) by mouth 3 (three) times daily.   lamoTRIgine 200 MG tablet Commonly known as: LAMICTAL TAKE 2 TABLETS BY MOUTH DAILY   meclizine 12.5 MG tablet Commonly known as: ANTIVERT TAKE ONE TABLET BY MOUTH TWICE  A DAY AS NEEDED FOR DIZZINESS What changed: See the new instructions.   metoprolol tartrate 25 MG tablet Commonly known as: LOPRESSOR Take 0.5 tablets (12.5 mg total) by mouth daily. Please keep scheduled appointment for future refills. Thank you.   oxyCODONE 5 MG immediate release tablet Commonly known as: Oxy IR/ROXICODONE Take 1-2 tablets (5-10 mg total) by mouth every 6 (six) hours as needed for breakthrough pain.   rosuvastatin 20 MG tablet Commonly known as: CRESTOR TAKE 1 TABLET BY MOUTH DAILY   tiZANidine 4 MG tablet Commonly known as: ZANAFLEX TAKE 1 TABLET BY MOUTH AT BEDTIME   traMADol 50 MG tablet Commonly known as: ULTRAM TAKE 2 TABLETS BY MOUTH TWICE A DAY   warfarin 7.5 MG tablet Commonly known as: COUMADIN Take as directed. If you are unsure how to take this medication, talk to your nurse or doctor. Original instructions: TAKE 1 TO TAKE 1 AND 1/2 (1-1.5) TABLET BY MOUTH DAILY AS DIRECTED BY COUMADIN CLINIC        Follow-up Information     Maczis, Hedda Slade, PA-C Follow up on 08/03/2023.   Specialty: General Surgery Why: 08/03/23 at 10 am. Please bring a copy of your photo ID, insurance card and arrive 30 minutes prior to your appointment for paperwork. Contact information: [address] [place] [address] [phone]         Etta Grandchild, MD Follow up.   Specialty: Internal Medicine Contact information: [address] [place] [address] [phone]         Carver Fila, MD Follow up.   Specialty: Gynecologic Oncology Contact information: [address] [place] [address] [phone]                Discharge Exam: Ceasar Mons Weights   07/11/23 0701  Weight: 51.3 kg  BP (!) 160/98 (BP Location: Left Arm)   Pulse 66   Temp 98 F (36.7 C) (Oral)   Resp 14   Ht 4' 11.5" (1.511 m)   Wt 51.3 kg   SpO2 99%   BMI 22.44 kg/m   Gen: No distress Pulm: Clear, nonlabored  CV: RRR, no MRG or  edema GI: Soft, appropriately tender with laparoscopic incision sites c/d/I with dermabond. No distention. +BS.  Neuro: Alert and oriented. No new focal deficits. Ext: Warm, no deformities. Skin: No other rashes, lesions or ulcers on visualized skin   Condition at discharge: stable  The results of significant diagnostics from this hospitalization (including imaging, microbiology, ancillary and laboratory) are listed below for reference.   Imaging Studies: US Abdomen Limited  Result Date: 07/07/2023 CLINICAL DATA:  Right upper quadrant abdominal pain EXAM: ULTRASOUND ABDOMEN LIMITED RIGHT UPPER QUADRANT COMPARISON:  None Available. FINDINGS: Gallbladder: Multiple layering gallstones are seen within the gallbladder. The gallbladder is mildly distended. The sonographic Eulah Pont sign is reportedly POSITIVE. However, the gallbladder wall is not thickened and no pericholecystic fluid is seen. Common bile duct: Diameter: 3 mm in proximal diameter Liver: No focal lesion identified. Within normal limits in parenchymal echogenicity. Portal vein is patent on color Doppler imaging with normal direction of blood flow towards the liver. Other: None. IMPRESSION: 1. Cholelithiasis with mild  gallbladder distention and a reportedly POSITIVE sonographic Murphy sign. Together, the findings may reflect early changes of acute cholecystitis. Correlation with liver enzymes and possible hepatobiliary scintigraphy may be helpful for further evaluation. Electronically Signed   By: Helyn Numbers M.D.   On: 07/07/2023 20:11   US Abdomen Complete  Result Date: 06/21/2023 CLINICAL DATA:  Upper abdominal pain EXAM: ABDOMEN ULTRASOUND COMPLETE COMPARISON:  None Available. FINDINGS: Gallbladder: Multiple stones in the gallbladder lumen largest measures 18 mm. No gallbladder wall thickening or pericholecystic fluid. Negative sonographic Murphy sign. Common bile duct: Diameter: 5.5 mm Liver: No focal lesion identified. Within normal  limits in parenchymal echogenicity. Portal vein is patent on color Doppler imaging with normal direction of blood flow towards the liver. IVC: No abnormality visualized. Pancreas: Visualized portion unremarkable. Spleen: Size and appearance within normal limits. Right Kidney: Length: 10.6 cm. Echogenicity within normal limits. No mass or hydronephrosis visualized. Left Kidney: Length: 10.1 cm. Echogenicity within normal limits. No mass or hydronephrosis visualized. Abdominal aorta: No aneurysm visualized. Other findings: None. IMPRESSION: Cholelithiasis without secondary signs of acute cholecystitis. Electronically Signed   By: Annia Belt M.D.   On: 06/21/2023 11:17    Microbiology: Results for orders placed or performed during the hospital encounter of 07/07/23  MRSA Next Gen by PCR, Nasal     Status: None   Collection Time: 07/10/23 10:55 PM   Specimen: Nasal Mucosa; Nasal Swab  Result Value Ref Range Status   MRSA by PCR Next Gen NOT DETECTED NOT DETECTED Final    Comment: (NOTE) The GeneXpert MRSA Assay (FDA approved for NASAL specimens only), is one component of a comprehensive MRSA colonization surveillance program. It is not intended to diagnose MRSA infection nor to guide or monitor treatment for MRSA infections. Test performance is not FDA approved in patients less than 54 years old. Performed at [facility], [address], [place], [place] [address]    *Note: Due to a large number of results and/or encounters for the requested time period, some results have not been displayed. A complete set of results can be found in Results Review.    Labs: CBC: Recent Labs  Lab 07/11/23 0128 07/12/23 0324 07/13/23 0322 07/14/23 1021 07/15/23 0019  WBC 4.7 5.6 4.3 4.8 4.6  HGB 14.2 12.2 13.2 14.4 14.8  HCT 43.2 37.7 38.4 40.9 44.5  MCV 95.8 93.5 93.0 91.3 92.1  PLT 92* 76* 70* 86* 84*   Basic Metabolic Panel: Recent Labs  Lab 07/09/23 0042 07/11/23 1224 07/13/23 0322  07/14/23 0318  NA 141 139 138 139  K 4.6 4.0 3.7 4.2  CL 107 103 102 100  CO2 27 27 29 28   GLUCOSE 88 125* 115* 102*  BUN 5* 5* 5* <5*  CREATININE 0.85 0.90 0.88 0.79  CALCIUM 8.7* 8.6* 8.6* 9.0  MG 2.1 1.8  --  2.3   Liver Function Tests: Recent Labs  Lab 07/09/23 0042 07/11/23 1224 07/13/23 0322 07/14/23 0318  AST 19 61* 51* 49*  ALT 13 50* 46* 52*  ALKPHOS 60 65 69 80  BILITOT 0.4 0.6 0.7 0.5  PROT 5.9* 5.9* 5.8* 6.3*  ALBUMIN 3.5 3.5 3.4* 3.7   CBG: Recent Labs  Lab 07/14/23 1751  GLUCAP 113*    Discharge time spent: greater than 30 minutes.  Signed: Tyrone Nine, MD [facility] 07/15/2023

## 2023-07-15 NOTE — Progress Notes (Signed)
ANTICOAGULATION CONSULT NOTE  Pharmacy Consult:  Coumadin Indication: mechanical AVR  Allergies  Allergen Reactions   Demerol Nausea And Vomiting   Ibuprofen Other (See Comments)    Gastritis     Patient Measurements: Height: 4' 11.5" (151.1 cm) Weight: 51.3 kg (113 lb) IBW/kg (Calculated) : 44.35  Vital Signs: Temp: 98 F (36.7 C) (08/15 0841) Temp Source: Oral (08/15 0841) BP: 160/98 (08/15 0841) Pulse Rate: 66 (08/15 0841)  Labs: Recent Labs    07/13/23 0315 07/13/23 0322 07/13/23 0322 07/14/23 0318 07/14/23 1021 07/15/23 0019  HGB  --  13.2   < >  --  14.4 14.8  HCT  --  38.4  --   --  40.9 44.5  PLT  --  70*  --   --  86* 84*  LABPROT  --  14.1  --  18.0*  --  33.2*  INR  --  1.1  --  1.5*  --  3.2*  HEPARINUNFRC 0.45  --   --   --   --   --   CREATININE  --  0.88  --  0.79  --   --    < > = values in this interval not displayed.    Estimated Creatinine Clearance: 63.6 mL/min (by C-G formula based on SCr of 0.79 mg/dL).   Assessment: Danielle Obrien a 42 y.o. female presented with biliary colic, on warfarin PTA - currently on hold for anticipated procedure. Pharmacy has been consulted for heparin dosing, which has been transitioned to Lovenox on 8/13.  Coumadin resumed on 8/12.   Home warfarin dose:  7.5 mg Sun, MWF, 11.25 mg TTS Noted INR was supratherapeutic on admission on home regimen  INR with rapid trend up, 1.5 > 3.2; no bleeding reported, CBC stable.  Did not eat well yesterday per charting (0-10%).   Goal of Therapy:  INR 2-3 Monitor platelets by anticoagulation protocol: Yes   Plan:  Lovenox 50mg  SQ BID per MD - recommend stopping when INR >/= 2 x48h Hold Coumadin today CBC Q72H, daily PT / INR Consider reducing home warfarin regimen to 7.5 mg daily except 10 mg on T/Th (~10% weekly reduction)  Thank you for involving pharmacy in this patient's care.  Loura Back, PharmD, BCPS Clinical Pharmacist Clinical phone for 07/15/2023 is  [phone] 07/15/2023 9:20 AM

## 2023-07-16 ENCOUNTER — Ambulatory Visit: Payer: Managed Care, Other (non HMO) | Attending: Cardiology

## 2023-07-16 ENCOUNTER — Inpatient Hospital Stay: Payer: Managed Care, Other (non HMO) | Admitting: Gynecologic Oncology

## 2023-07-16 DIAGNOSIS — Q231 Congenital insufficiency of aortic valve: Secondary | ICD-10-CM

## 2023-07-16 DIAGNOSIS — D071 Carcinoma in situ of vulva: Secondary | ICD-10-CM

## 2023-07-16 DIAGNOSIS — Z7901 Long term (current) use of anticoagulants: Secondary | ICD-10-CM

## 2023-07-16 DIAGNOSIS — Z954 Presence of other heart-valve replacement: Secondary | ICD-10-CM | POA: Diagnosis not present

## 2023-07-16 DIAGNOSIS — Q230 Congenital stenosis of aortic valve: Secondary | ICD-10-CM | POA: Diagnosis not present

## 2023-07-16 LAB — POCT INR: INR: 3.4 — AB (ref 2.0–3.0)

## 2023-07-16 NOTE — Patient Instructions (Signed)
*  PLEASE PUT ON APPT NO ONSITE INTERPRETER NEEDED*   Only take 0.5 TABLET TODAY THEN CONTINUE taking warfarin 1.5 tablets daily except for 1 tablet on Sunday, Monday, Wednesdays, and Fridays.  Stay consistent with greens each week (2 per week)  Recheck INR in 2 weeks  Coumadin Clinic [phone]

## 2023-07-19 ENCOUNTER — Telehealth: Payer: Self-pay

## 2023-07-19 NOTE — Transitions of Care (Post Inpatient/ED Visit) (Unsigned)
   07/19/2023  Name: Danielle Obrien MRN: [id number] DOB: 1981-10-24  Today's TOC FU Call Status: Today's TOC FU Call Status:: Unsuccessful Call (1st Attempt) Unsuccessful Call (1st Attempt) Date: 07/19/23  Attempted to reach the patient regarding the most recent Inpatient/ED visit.  Follow Up Plan: Additional outreach attempts will be made to reach the patient to complete the Transitions of Care (Post Inpatient/ED visit) call.   Signature   Woodfin Ganja LPN [facility] Nurse Health Advisor Direct Dial [phone]

## 2023-07-20 NOTE — Transitions of Care (Post Inpatient/ED Visit) (Signed)
   07/20/2023  Name: Danielle Obrien MRN: [id number] DOB: [birth year]  Today's TOC FU Call Status: Today's TOC FU Call Status:: Successful TOC FU Call Completed Unsuccessful Call (1st Attempt) Date: 07/19/23 [facility] FU Call Complete Date: 07/20/23  Transition Care Management Follow-up Telephone Call Date of Discharge: 07/15/23 Discharge Facility: [facility]) Type of Discharge: Inpatient Admission Primary Inpatient Discharge Diagnosis:: Cholelithiasis and acute cholecystitis without obstruction How have you been since you were released from the hospital?: Better Any questions or concerns?: Yes Patient Questions/Concerns:: (S) pt is confused about care and who she needs to follow up with- and alot of questions in general Patient Questions/Concerns Addressed: (S) Notified Provider of Patient Questions/Concerns  Items Reviewed: Did you receive and understand the discharge instructions provided?: Yes Medications obtained,verified, and reconciled?: Yes (Medications Reviewed) Any new allergies since your discharge?: No Dietary orders reviewed?: Yes Do you have support at home?: Yes  Medications Reviewed Today: Medications Reviewed Today   Medications were not reviewed in this encounter     Home Care and Equipment/Supplies: Were Home Health Services Ordered?: No Any new equipment or medical supplies ordered?: No  Functional Questionnaire: Do you need assistance with bathing/showering or dressing?: No Do you need assistance with meal preparation?: No Do you need assistance with eating?: No Do you have difficulty maintaining continence: No Do you need assistance with getting out of bed/getting out of a chair/moving?: No Do you have difficulty managing or taking your medications?: No  Follow up appointments reviewed: PCP Follow-up appointment confirmed?: (S) No (pt needs appt ASAP- openings 07-21-23 but not able to schedule them- will ask front desk to call pt to schedule- pt does not  have copay but can make payments due to being out of work- needs labs rechecked and talk to Dr Jenita Seashore) MD Provider Line Number:[phone] Given: Yes Follow-up Provider: Dr Yetta Barre [facility] Follow-up appointment confirmed?: Yes Date of Specialist follow-up appointment?: 08/03/23 Follow-Up Specialty Provider:: surgeon Do you need transportation to your follow-up appointment?: No Do you understand care options if your condition(s) worsen?: Yes-patient verbalized understanding    SIGNATURE  Woodfin Ganja LPN [facility] Nurse Health Advisor Direct Dial [phone]

## 2023-07-29 ENCOUNTER — Telehealth: Payer: Self-pay | Admitting: *Deleted

## 2023-07-29 ENCOUNTER — Ambulatory Visit: Payer: Managed Care, Other (non HMO)

## 2023-07-29 NOTE — Telephone Encounter (Signed)
Called pt since she missed her appt today in the Anticoagulation Clinic. There was no answer so left her a message to call back to reschedule.

## 2023-07-30 ENCOUNTER — Other Ambulatory Visit: Payer: Self-pay | Admitting: Cardiology

## 2023-07-30 DIAGNOSIS — Z954 Presence of other heart-valve replacement: Secondary | ICD-10-CM

## 2023-07-30 DIAGNOSIS — Z7901 Long term (current) use of anticoagulants: Secondary | ICD-10-CM

## 2023-07-30 DIAGNOSIS — Q230 Congenital stenosis of aortic valve: Secondary | ICD-10-CM

## 2023-07-30 NOTE — Telephone Encounter (Addendum)
Warfarin 7.5mg   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  Last INR 07/16/23 & pending appt 08/12/23 Last OV 05/08/22 & pending appt with Dr. Mayford Knife on 08/12/23

## 2023-08-02 ENCOUNTER — Other Ambulatory Visit: Payer: Self-pay | Admitting: Cardiology

## 2023-08-02 ENCOUNTER — Other Ambulatory Visit: Payer: Self-pay | Admitting: Internal Medicine

## 2023-08-02 DIAGNOSIS — F3177 Bipolar disorder, in partial remission, most recent episode mixed: Secondary | ICD-10-CM

## 2023-08-03 ENCOUNTER — Other Ambulatory Visit: Payer: Self-pay | Admitting: Internal Medicine

## 2023-08-03 ENCOUNTER — Other Ambulatory Visit: Payer: Self-pay | Admitting: Family Medicine

## 2023-08-03 DIAGNOSIS — F3177 Bipolar disorder, in partial remission, most recent episode mixed: Secondary | ICD-10-CM

## 2023-08-03 MED ORDER — LAMOTRIGINE 200 MG PO TABS
400.0000 mg | ORAL_TABLET | Freq: Every day | ORAL | 0 refills | Status: AC
Start: 2023-08-03 — End: ?

## 2023-08-05 ENCOUNTER — Ambulatory Visit: Payer: Managed Care, Other (non HMO) | Attending: Cardiology | Admitting: Cardiology

## 2023-08-05 ENCOUNTER — Encounter: Payer: Self-pay | Admitting: Cardiology

## 2023-08-05 ENCOUNTER — Other Ambulatory Visit: Payer: Self-pay | Admitting: Internal Medicine

## 2023-08-05 VITALS — BP 130/90 | HR 73 | Ht 60.0 in | Wt 113.4 lb

## 2023-08-05 DIAGNOSIS — Q231 Congenital insufficiency of aortic valve: Secondary | ICD-10-CM

## 2023-08-05 DIAGNOSIS — I10 Essential (primary) hypertension: Secondary | ICD-10-CM | POA: Diagnosis not present

## 2023-08-05 DIAGNOSIS — Z954 Presence of other heart-valve replacement: Secondary | ICD-10-CM

## 2023-08-05 DIAGNOSIS — Q230 Congenital stenosis of aortic valve: Secondary | ICD-10-CM

## 2023-08-05 DIAGNOSIS — F3177 Bipolar disorder, in partial remission, most recent episode mixed: Secondary | ICD-10-CM

## 2023-08-05 MED ORDER — METOPROLOL TARTRATE 25 MG PO TABS: 12.5000 mg | ORAL_TABLET | Freq: Every day | ORAL | 3 refills | Status: DC

## 2023-08-05 NOTE — Progress Notes (Addendum)
Cardiology Office Note:    Date:  08/05/2023   ID:  Danielle Obrien, DOB 29-Aug-1981, MRN [id number]  PCP:  Etta Grandchild, MD  Cardiologist:  Armanda Magic, MD    Referring MD: Etta Grandchild, MD   Chief Complaint  Patient presents with   Aortic Stenosis   Hypertension    History of Present Illness:    Danielle Obrien is a 42 y.o. female with a hx of bicuspid AV with severe AS, bipolar disorder, asthma and arthritis.  She underwent minimally invasive AVR with a 23mm Sorin Carbomed[employer] Top Hat bileaflet mechanical valve via right anterior mini thoracotomy on 11/03/2017 by Dr. Cornelius Moras after coronary CT showed no CAD.    She is here today for followup and is doing well.  Since I saw her last she had a laparoscopic cholecystectomy.  She still has some mild epigastric pain from the surgery.  She denies any SOB, DOE, PND, orthopnea, LE edema, palpitations or syncope. She is compliant with her meds and is tolerating meds with no SE.    Past Medical History:  Diagnosis Date   ADD (attention deficit disorder)    Alcohol abuse, in remission 2012   per pt in remission since 2012   Anticoagulated on Coumadin    coumadin--- managed by cardiology/ coumadin clinic   Arthritis    In hips   Asthma    allergy induced, rare inhaler use   Auditory processing disorder    per pt very HOH, first language ASL   Bipolar disorder (HCC)    Chronic left hip pain    Chronic vertigo    Complication of anesthesia    per pt due to auditory processing disorder pt is unable to hear when waking up and also gets very anxious   Family history of uterine cancer    GAD (generalized anxiety disorder)    GERD (gastroesophageal reflux disease)    History of cervical dysplasia    History of condyloma acuminatum 2017   s/p laser ablation vulva   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   biopsy and CO2 laser ablation   HOH (hard of hearing)    per pt very HOH due to auditroy processing disorder   Hyperlipidemia     Hypertension    Muscle spasms of both lower extremities    both hips   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   23 mm Sorin Carbomedics Top Hat bileaflet mechanical valve via right anterior mini thoracotomy   Vulvar dysplasia     Past Surgical History:  Procedure Laterality Date   ANTERIOR CRUCIATE LIGAMENT REPAIR  1993   AORTIC VALVE REPLACEMENT N/A 11/03/2017   Procedure: MINIMALLY INVASIVE AORTIC VALVE REPLACEMENT( MINI THORACOTOMY);  Surgeon: Purcell Nails, MD;  Location[facility] OR;  Service: Open Heart Surgery;  Laterality: N/A;   CERVICAL BIOPSY  W/ LOOP ELECTRODE EXCISION  2010   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 ECC  COLPOSCOPY  06/30/2001   CIN I   COLPOSCOPY  08/30/2004   ECC--atypia   COLPOSCOPY N/A 01/21/2016   Procedure: COLPOSCOPY with vaginal biopsy with CO 2 Laser of Vaginal and vulvar condyloma;  Surgeon: Patton Salles, MD;  Location: [facility] ORS;  Service: Gynecology;  Laterality: N/A;  [name] will be here 2/21 for 1115 case confirmed 01/16/15 - TS   HARDWARE REMOVAL Left 1992   Left hip   INGUINAL HERNIA REPAIR Bilateral    1981;  [id number]   LEFT AND RIGHT HEART CATHETERIZATION WITH CORONARY ANGIOGRAM N/A 05/18/2014   Procedure: LEFT AND RIGHT HEART CATHETERIZATION WITH CORONARY ANGIOGRAM;  Surgeon: [name] Crafts, MD;  Location: [facility] CATH LAB;  Service: Cardiovascular;  Laterality: N/A;   LESION  REMOVAL Right 05/12/2022   Procedure: Sharen Hint OF RIGHT CHEEK CYST;  Surgeon: Allena Napoleon, MD;  Location: [facility];  Service: Plastics;  Laterality: Right;   LYMPH NODE BIOPSY  1995   NASAL SEPTUM SURGERY  2002   ORIF HIP FRACTURE Left 1990   per pt and  took out growth plate in Right knee   ROBOTIC ASSISTED TOTAL HYSTERECTOMY  02/26/2009   @[facility]    TEE WITHOUT CARDIOVERSION N/A 11/03/2017   Procedure: TRANSESOPHAGEAL ECHOCARDIOGRAM (TEE);  Surgeon: Purcell Nails, MD;  Location: [facility] OR;  Service: Open Heart Surgery;  Laterality: N/A;   TONSILLECTOMY AND ADENOIDECTOMY  1990   TYMPANOSTOMY TUBE PLACEMENT Bilateral    x3  per pt last one 1982   VULVA /PERINEUM BIOPSY N/A 05/12/2022   Procedure: POSSIBLE VULVAR BIOPSY; POSSIBLE VAGINAL BIOPSY;  Surgeon: Carver Fila, MD;  Location: [facility];  Service: Gynecology;  Laterality: N/A;   VULVECTOMY N/A 05/12/2022   Procedure: WIDE EXCISION VULVECTOMY;  Surgeon: Carver Fila, MD;  Location: [facility];  Service: Gynecology;  Laterality: N/A;   VULVECTOMY N/A 08/12/2022   Procedure: WIDE LOCAL EXCISION VULVECTOMY;  Surgeon: Carver Fila, MD;  Location: [facility];  Service: Gynecology;  Laterality: N/A;    Current Medications: Current Meds  Medication Sig   albuterol (VENTOLIN HFA) 108 (90 Base) MCG/ACT inhaler Inhale 2 puffs into the lungs every 6 (six) hours as needed for wheezing or shortness of breath.   aspirin EC 81 MG tablet Take 81 mg by mouth daily. Swallow whole.   DULoxetine (CYMBALTA) 20 MG capsule TAKE 2 CAPSULES BY MOUTH DAILY   gabapentin (NEURONTIN) 300 MG capsule Take 2 capsules (600 mg total) by mouth 3 (three) times daily.   lamoTRIgine (LAMICTAL) 200 MG tablet Take 2 tablets (400 mg total) by mouth daily.   meclizine (ANTIVERT) 12.5 MG tablet TAKE ONE TABLET BY MOUTH TWICE A DAY AS NEEDED FOR DIZZINESS (Patient taking differently: 2 (two) times  daily as needed.)   metoprolol tartrate (LOPRESSOR) 25 MG tablet Take 0.5 tablets (12.5 mg total) by mouth daily. Please keep scheduled appointment for future refills. Thank you.   oxyCODONE (OXY IR/ROXICODONE) 5 MG immediate release tablet Take 1-2 tablets (5-10 mg total) by mouth every 6 (six) hours as needed for breakthrough pain.   rosuvastatin (CRESTOR) 20 MG tablet TAKE 1 TABLET BY MOUTH DAILY   tiZANidine (ZANAFLEX) 4 MG tablet TAKE 1 TABLET BY MOUTH AT BEDTIME   traMADol (ULTRAM) 50 MG tablet TAKE 2 TABLETS BY MOUTH TWICE A DAY   warfarin (COUMADIN) 7.5 MG tablet Take one to one and one-half (1-1.5) tablets by mouth daily as directed by  Anticoagulation Clinic. Please Keep Upcoming Appt For Refills.     Allergies:   Demerol and Ibuprofen   Social History   Socioeconomic History   Marital status: Single    Spouse name: Not on file   Number of children: Not on file   Years of education: Not on file   Highest education level: Not on file  Occupational History   Not on file  Tobacco Use   Smoking status: Every Day    Current packs/day: 0.25    Average packs/day: 0.3 packs/day for 20.0 years (5.0 ttl pk-yrs)    Types: Cigarettes   Smokeless tobacco: Never  Vaping Use   Vaping status: Never Used  Substance and Sexual Activity   Alcohol use: No    Comment: in remission since 2012   Drug use: Yes    Types: Marijuana    Comment: 08-11-22 last, per pt smokes on average 1/4oz per week   Sexual activity: Yes    Partners: Male    Birth control/protection: Surgical    Comment: Hysterectomy  Other Topics Concern   Not on file  Social History Narrative   Not on file   Social Determinants of Health   Financial Resource Strain: Low Risk  (12/10/2018)   Overall Financial Resource Strain (CARDIA)    Difficulty of Paying Living Expenses: Not hard at all  Food Insecurity: No Food Insecurity (07/07/2023)   Hunger Vital Sign    Worried About Running Out of Food in the Last Year: Never  true    Ran Out of Food in the Last Year: Never true  Transportation Needs: No Transportation Needs (07/07/2023)   PRAPARE - Administrator, Civil Service (Medical): No    Lack of Transportation (Non-Medical): No  Physical Activity: Unknown (12/10/2018)   Exercise Vital Sign    Days of Exercise per Week: Patient declined    Minutes of Exercise per Session: Patient declined  Stress: Not on file  Social Connections: Unknown (12/10/2018)   Social Connection and Isolation Panel [NHANES]    Frequency of Communication with Friends and Family: Patient declined    Frequency of Social Gatherings with Friends and Family: Patient declined    Attends Religious Services: Patient declined    Database administrator or Organizations: Patient declined    Attends Engineer, structural: Patient declined    Marital Status: Patient declined     Family History: The patient's family history includes Colon cancer in her paternal great-grandmother; Dementia in her maternal grandmother and paternal grandmother; Heart attack in her paternal grandfather and paternal uncle; Hyperlipidemia in her brother, father, and mother; Hypertension in her brother, father, and mother; Migraines in her father; Other in her mother; Prostate cancer (age of onset: 48) in her maternal grandfather; Skin cancer in her father; Thyroid disease in her maternal grandfather, maternal grandmother, and mother; Uterine cancer (age of onset: 65) in her maternal grandmother.  ROS:   Please see the history of present illness.    ROS  All other systems reviewed and negative.   EKGs/Labs/Other Studies Reviewed:    The following studies were reviewed today:   Recent Labs: 07/14/2023: ALT 52; BUN <5; Creatinine, Ser 0.79; Magnesium 2.3; Potassium 4.2; Sodium 139 07/15/2023: Hemoglobin 14.8; Platelets 84   Recent Lipid Panel    Component Value Date/Time   CHOL 195 02/11/2023 1613   TRIG 67.0 02/11/2023 1613   HDL 77.90  02/11/2023 1613   CHOLHDL 3 02/11/2023 1613   VLDL  13.4 02/11/2023 1613   LDLCALC 104 (H) 02/11/2023 1613   LDLDIRECT 171.0 01/27/2013 1425    Physical Exam:    VS:  BP (!) 130/90   Pulse 73   Ht 5' (1.524 m)   Wt 113 lb 6.4 oz (51.4 kg)   SpO2 96%   BMI 22.15 kg/m     Wt Readings from Last 3 Encounters:  08/05/23 113 lb 6.4 oz (51.4 kg)  07/11/23 113 lb (51.3 kg)  06/14/23 115 lb 8 oz (52.4 kg)    GEN: Well nourished, well developed in no acute distress HEENT: Normal NECK: No JVD; No carotid bruits LYMPHATICS: No lymphadenopathy CARDIAC:RRR, no murmurs, rubs, gallops RESPIRATORY:  Clear to auscultation without rales, wheezing or rhonchi  ABDOMEN: Soft, non-tender, non-distended MUSCULOSKELETAL:  No edema; No deformity  SKIN: Warm and dry NEUROLOGIC:  Alert and oriented x 3 PSYCHIATRIC:  Normal affect  ASSESSMENT:    1. Aortic stenosis due to bicuspid aortic valve   2. 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   3. Essential hypertension, benign     PLAN:    In order of problems listed above:  1.  Severe AS -s/p minimally invasive AVR with a 23mm Sorin Carbomed[employer] Top Hat bileaflet mechanical valve via right anterior mini thoracotomy on 11/03/2017 -She denies any bleeding problems on warfarin and is completely asymptomatic from a cardiac standpoint -Continue prescription drug therapy with warfarin and ASA 81mg  daily per pharmacy -discussed SBE prophylaxis for dental procedures  2.  HTN -BP controlled on exam today -Continue prescription drug management with Lopressor 12.5 mg but increase from once daily to twice daily with as needed refills  Medication Adjustments/Labs and Tests Ordered: Current medicines are reviewed at length with the patient today.  Concerns regarding medicines are outlined above.  No orders of the defined types were placed in this encounter.  No orders of the defined types were placed in this  encounter.   Signed, Armanda Magic, MD  08/05/2023 3:18 PM    [facility]

## 2023-08-05 NOTE — Patient Instructions (Signed)
Medication Instructions:  Please take your lopressor 12.5 mg twice a day.   *If you need a refill on your cardiac medications before your next appointment, please call your pharmacy*   Lab Work: None.  If you have labs (blood work) drawn today and your tests are completely normal, you will receive your results only by: MyChart Message (if you have MyChart) OR A paper copy in the mail If you have any lab test that is abnormal or we need to change your treatment, we will call you to review the results.   Testing/Procedures: None.   Follow-Up: At [facility], you and your health needs are our priority.  As part of our continuing mission to provide you with exceptional heart care, we have created designated Provider Care Teams.  These Care Teams include your primary Cardiologist (physician) and Advanced Practice Providers (APPs -  Physician Assistants and Nurse Practitioners) who all work together to provide you with the care you need, when you need it.  We recommend signing up for the patient portal called "MyChart".  Sign up information is provided on this After Visit Summary.  MyChart is used to connect with patients for Virtual Visits (Telemedicine).  Patients are able to view lab/test results, encounter notes, upcoming appointments, etc.  Non-urgent messages can be sent to your provider as well.   To learn more about what you can do with MyChart, go to [email or web address].    Your next appointment:   1 year(s)  Provider:   Armanda Magic, MD

## 2023-08-05 NOTE — Addendum Note (Signed)
Addended by: Luellen Pucker on: 08/05/2023 03:31 PM   Modules accepted: Orders

## 2023-08-11 ENCOUNTER — Other Ambulatory Visit: Payer: Self-pay | Admitting: Family Medicine

## 2023-08-12 ENCOUNTER — Ambulatory Visit: Payer: Managed Care, Other (non HMO) | Attending: Internal Medicine | Admitting: *Deleted

## 2023-08-12 DIAGNOSIS — Q2381 Bicuspid aortic valve: Secondary | ICD-10-CM

## 2023-08-12 DIAGNOSIS — Q231 Congenital insufficiency of aortic valve: Secondary | ICD-10-CM | POA: Diagnosis not present

## 2023-08-12 DIAGNOSIS — Q230 Congenital stenosis of aortic valve: Secondary | ICD-10-CM

## 2023-08-12 DIAGNOSIS — Z954 Presence of other heart-valve replacement: Secondary | ICD-10-CM

## 2023-08-12 DIAGNOSIS — Z7901 Long term (current) use of anticoagulants: Secondary | ICD-10-CM | POA: Diagnosis not present

## 2023-08-12 LAB — POCT INR: INR: 2.4 (ref 2.0–3.0)

## 2023-08-12 NOTE — Patient Instructions (Addendum)
Description   *PLEASE PUT ON APPT NO ONSITE INTERPRETER NEEDED*   CONTINUE taking the dose you have been taking, which is, warfarin 1.5 tablets daily except for 1 tablet on Monday, Wednesdays, and Fridays.  Stay consistent with greens each week (2 per week)  Recheck INR in 4 weeks  Coumadin Clinic [phone]

## 2023-08-20 ENCOUNTER — Telehealth: Payer: Self-pay | Admitting: *Deleted

## 2023-08-20 ENCOUNTER — Other Ambulatory Visit: Payer: Self-pay | Admitting: Gynecologic Oncology

## 2023-08-20 DIAGNOSIS — F418 Other specified anxiety disorders: Secondary | ICD-10-CM

## 2023-08-20 MED ORDER — LORAZEPAM 0.5 MG PO TABS
0.5000 mg | ORAL_TABLET | Freq: Once | ORAL | 0 refills | Status: AC
Start: 2023-08-20 — End: 2023-08-20

## 2023-08-20 NOTE — Telephone Encounter (Signed)
Called and spoke with Danielle Obrien for meaningful use call and reminded patient of her appointment with Dr. Pricilla Holm next Thursday, September 26 th at 0830. Pt mentioned that if Dr.Tucker has to do a biopsy (pt has a couple areas of concern) she would like a Rx sent in for a dose of an anti-anxiety med that she can take prior to her arrival. Pt understands that someone will need to drive her to the appointment. Advised patient that her message would be forwarded to providers. Pt thanked the office for calling and had no further concerns or questions at this time.

## 2023-08-20 NOTE — Telephone Encounter (Signed)
Spoke with patient and relayed message from Dr. Pricilla Holm that she would send something in to take just in case biopsy is necessary. Pt verbalized understanding and thanked the office for calling.

## 2023-08-20 NOTE — Telephone Encounter (Signed)
Hard to know if I will need to take a biopsy until I see her. I will send something for her to take in case.

## 2023-08-25 NOTE — Progress Notes (Unsigned)
Gynecologic Oncology Return Clinic Visit  08/26/23  Reason for Visit: follow-up   Treatment History: Dysplasia history:  03/19/22: Vaginal cuff biopsy - VAIN1; left buttocks biopsy - VIN3, Perineal biopsy - VIN 2-3, mons biopsy - VIN 2-3. 01/2022 Vaginal pap - negative, HR HPV positive 12/2018: Pap - negative, HR HPV positive (negative for 16, 18 and 45) 05/2017: vaginal biopsies - one with benign squamous mucosa, other with VAIN I 04/2017: Vaginal pap - negative, HR HPV not detected. 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 Vaginal biopsies, one with VAIN 2, other VAIN 1. 10/2015: vaginal cuff biopsy: VAIN II.  Vulvar biopsies performed including mons and right labia, both showing condyloma. 08/2015: Vaginal pap - LSIL, HR HPV positive. 02/2014: Vaginal cuff biopsy showed condylomata acuminata, p16 negative for diffuse positive staining.  No malignancy or high-grade dysplasia.  Right labial biopsy shows condyloma. 01/2014: Vaginal pap - LSIL.  Reactive squamous cells present. HR HPV positive. 01/2009: Pathology from hysterectomy shows chronic mild inflammation of the cervix and endocervix, mesonephric hyperplasia.  No evidence of malignancy.  Secretory type endometrium. 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  Endo and ectocervical margins negative for mucosa but partially denuded.  Stromal invasion noted. 10/2008: Cervical biopsy at 6:00 shows HPV effect consistent with low-grade dysplasia.  ECC with 1 fragment of ectocervical/squamous metaplasia with low-grade intraepithelial lesion and focal changes consistent with CIN-2. 07/2020: ECC shows benign endocervix, no dysplasia. 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  Cervical biopsy at 1:00 showed squamous mucosa with koilocytic atypia, no dysplasia identified.  ECC shows rare detached fragments of slightly  dysplastic squamous mucosa with benign endocervical mucosa.   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   07/23/22: Vulvar biopsy given symptoms at prior healing incision shows VIN3.    08/12/22: Partial simple anterior left vulvectomy. Pathology: VIN3, margins negative. Right mons biopsy and posterior fourchette biopsy negative for dysplasia or malignancy/  Interval History: Doing well.  Noted some bumps on her outer vulva recently although denies any symptoms including pruritus and pain.  Had some palpable areas near hemorrhoids, thinks these have resolved.  Denies any bleeding.  Reports baseline discharge.  Bowel function is normalizing after recent gallbladder surgery.  Past Medical/Surgical History: Past Medical History:  Diagnosis Date   ADD (attention deficit disorder)    Alcohol abuse, in remission 2012   per pt in remission since 2012   Anticoagulated on Coumadin    coumadin--- managed by cardiology/ coumadin clinic   Arthritis    In hips   Asthma    allergy induced, rare inhaler use   Auditory processing disorder    per pt very HOH, first language ASL   Bipolar disorder (HCC)    Chronic left hip pain    Chronic vertigo    Complication of anesthesia    per pt due to auditory processing disorder pt is unable to hear when waking up and also gets very anxious   Family history of uterine cancer    GAD (generalized anxiety disorder)    GERD (gastroesophageal reflux disease)    History of cervical dysplasia    History of condyloma acuminatum 2017   s/p laser ablation vulva   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   biopsy and CO2 laser ablation   HOH (hard of hearing)  per pt very HOH due to auditroy processing disorder   Hyperlipidemia    Hypertension    Muscle spasms of both lower extremities    both hips   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   23 mm Sorin  [employer] [place] bileaflet mechanical valve via right anterior mini thoracotomy   Vulvar dysplasia     Past Surgical History:  Procedure Laterality Date   ANTERIOR CRUCIATE LIGAMENT REPAIR  1993   AORTIC VALVE REPLACEMENT N/A 11/03/2017   Procedure: MINIMALLY INVASIVE AORTIC VALVE REPLACEMENT( MINI THORACOTOMY);  Surgeon: Danielle Nails, Obrien;  Location: [facility] OR;  Service: Open Heart Surgery;  Laterality: N/A;   CERVICAL BIOPSY  W/ LOOP ELECTRODE EXCISION  2010   CIN III w/extension to glands   CHOLECYSTECTOMY N/A 07/11/2023   Procedure: LAPAROSCOPIC CHOLECYSTECTOMY WITH POSSIBLE INTRAOPERATIVE CHOLANGIOGRAM;  Surgeon: Danielle Bue, Obrien;  Location: [facility] OR;  Service: General;  Laterality: N/A;   CLOSED REDUCTION HIP DISLOCATION Left 1981   CO2 LASER APPLICATION N/A 05/12/2022   Procedure: CO2 LASER APPLICATION TO VULVA;  Surgeon: Danielle Obrien;  Location: [facility];  Service: Gynecology;  Laterality: N/A;   COLPOSCOPY  10/30/2008   CIN I & II   COLPOSCOPY  07/31/2000   Neg. ECC   COLPOSCOPY  06/30/2001   CIN I   COLPOSCOPY  08/30/2004   ECC--atypia   COLPOSCOPY N/A 01/21/2016   Procedure: COLPOSCOPY with vaginal biopsy with CO 2 Laser of Vaginal and vulvar condyloma;  Surgeon: Danielle Salles, Obrien;  Location: [facility] ORS;  Service: Gynecology;  Laterality: N/A;  Danielle Obrien for 1115 case confirmed 01/16/15 - TS   HARDWARE REMOVAL Left 1992   Left hip   INGUINAL HERNIA REPAIR Bilateral    1981;  [id number]   LEFT AND RIGHT HEART CATHETERIZATION WITH CORONARY ANGIOGRAM N/A 05/18/2014   Procedure: LEFT AND RIGHT HEART CATHETERIZATION WITH CORONARY ANGIOGRAM;  Surgeon: Danielle Crafts, Obrien;  Location: [facility] CATH LAB;  Service: Cardiovascular;  Laterality: N/A;   LESION REMOVAL Right 05/12/2022   Procedure: Danielle Obrien;  Surgeon: Danielle Napoleon, Obrien;  Location: [facility] [place];  Service: Plastics;  Laterality: Right;   LYMPH  NODE BIOPSY  1995   NASAL SEPTUM SURGERY  2002   ORIF HIP FRACTURE Left 1990   per pt and  took out growth plate in Right knee   ROBOTIC ASSISTED TOTAL HYSTERECTOMY  02/26/2009   @[facility]    TEE WITHOUT CARDIOVERSION N/A 11/03/2017   Procedure: TRANSESOPHAGEAL ECHOCARDIOGRAM (TEE);  Surgeon: Danielle Nails, Obrien;  Location: [facility] OR;  Service: Open Heart Surgery;  Laterality: N/A;   TONSILLECTOMY AND ADENOIDECTOMY  1990   TYMPANOSTOMY TUBE PLACEMENT Bilateral    x3  per pt last one 1982   VULVA /PERINEUM BIOPSY N/A 05/12/2022   Procedure: POSSIBLE VULVAR BIOPSY; POSSIBLE VAGINAL BIOPSY;  Surgeon: Danielle Obrien;  Location: [facility];  Service: Gynecology;  Laterality: N/A;   VULVECTOMY N/A 05/12/2022   Procedure: WIDE EXCISION VULVECTOMY;  Surgeon: Danielle Obrien;  Location: [facility];  Service: Gynecology;  Laterality: N/A;   VULVECTOMY N/A 08/12/2022   Procedure: WIDE LOCAL EXCISION VULVECTOMY;  Surgeon: Danielle Obrien;  Location: [facility];  Service: Gynecology;  Laterality: N/A;    Family History  Problem Relation Age of Onset   Hyperlipidemia Mother  Hypertension Mother    Thyroid disease Mother    Other Mother        Pituitary tumor   Hyperlipidemia Father    Hypertension Father    Migraines Father    Skin cancer Father    Hypertension Brother    Hyperlipidemia Brother    Heart attack Paternal Uncle    Thyroid disease Maternal Grandmother    Uterine cancer Maternal Grandmother 2   Dementia Maternal Grandmother    Thyroid disease Maternal Grandfather    Prostate cancer Maternal Grandfather 40   Dementia Paternal Grandmother    Heart attack Paternal Grandfather    Colon cancer Paternal Great-grandmother     Social History   Socioeconomic History   Marital status: Single    Spouse name: Not on file   Number of children: Not on file   Years of education: Not on file   Highest education level: Not  on file  Occupational History   Not on file  Tobacco Use   Smoking status: Every Day    Current packs/day: 0.25    Average packs/day: 0.3 packs/day for 20.0 years (5.0 ttl pk-yrs)    Types: Cigarettes   Smokeless tobacco: Never  Vaping Use   Vaping status: Never Used  Substance and Sexual Activity   Alcohol use: No    Comment: in remission since 2012   Drug use: Yes    Types: Marijuana    Comment: 08-11-22 last, per pt smokes on average 1/4oz per week   Sexual activity: Yes    Partners: Male    Birth control/protection: Surgical    Comment: Hysterectomy  Other Topics Concern   Not on file  Social History Narrative   Not on file   Social Determinants of Health   Financial Resource Strain: Low Risk  (12/10/2018)   Overall Financial Resource Strain (CARDIA)    Difficulty of Paying Living Expenses: Not hard at all  Food Insecurity: No Food Insecurity (07/07/2023)   Hunger Vital Sign    Worried About Running Out of Food in the Last Year: Never true    Ran Out of Food in the Last Year: Never true  Transportation Needs: No Transportation Needs (07/07/2023)   PRAPARE - Administrator, Civil Service (Medical): No    Lack of Transportation (Non-Medical): No  Physical Activity: Unknown (12/10/2018)   Exercise Vital Sign    Days of Exercise per Week: Patient declined    Minutes of Exercise per Session: Patient declined  Stress: Not on file  Social Connections: Unknown (12/10/2018)   Social Connection and Isolation Panel [NHANES]    Frequency of Communication with Friends and Family: Patient declined    Frequency of Social Gatherings with Friends and Family: Patient declined    Attends Religious Services: Patient declined    Database administrator or Organizations: Patient declined    Attends Engineer, structural: Patient declined    Marital Status: Patient declined    Current Medications:  Current Outpatient Medications:    albuterol (VENTOLIN HFA) 108 (90  Base) MCG/ACT inhaler, Inhale 2 puffs into the lungs every 6 (six) hours as needed for wheezing or shortness of breath., Disp: 1 each, Rfl: 3   aspirin EC 81 MG tablet, Take 81 mg by mouth daily. Swallow whole., Disp: , Rfl:    DULoxetine (CYMBALTA) 20 MG capsule, TAKE 2 CAPSULES BY MOUTH DAILY, Disp: 60 capsule, Rfl: 0   gabapentin (NEURONTIN) 300 MG capsule, Take 2 capsules (600  mg total) by mouth 3 (three) times daily., Disp: 540 capsule, Rfl: 1   lamoTRIgine (LAMICTAL) 200 MG tablet, Take 2 tablets (400 mg total) by mouth daily., Disp: 60 tablet, Rfl: 0   meclizine (ANTIVERT) 12.5 MG tablet, TAKE ONE TABLET BY MOUTH TWICE A DAY AS NEEDED FOR DIZZINESS (Patient taking differently: 2 (two) times daily as needed.), Disp: 20 tablet, Rfl: 0   metoprolol tartrate (LOPRESSOR) 25 MG tablet, Take 0.5 tablets (12.5 mg total) by mouth daily. Please keep scheduled appointment for future refills. Thank you., Disp: 90 tablet, Rfl: 3   rosuvastatin (CRESTOR) 20 MG tablet, TAKE 1 TABLET BY MOUTH DAILY, Disp: 90 tablet, Rfl: 0   tiZANidine (ZANAFLEX) 4 MG tablet, TAKE 1 TABLET BY MOUTH AT BEDTIME, Disp: 30 tablet, Rfl: 0   traMADol (ULTRAM) 50 MG tablet, TAKE 2 TABLETS BY MOUTH TWICE A DAY, Disp: 120 tablet, Rfl: 0   warfarin (COUMADIN) 7.5 MG tablet, Take one to one and one-half (1-1.5) tablets by mouth daily as directed by Anticoagulation Clinic. Please Keep Upcoming Appt For Refills., Disp: 40 tablet, Rfl: 0  Review of Systems: Denies appetite changes, fevers, chills, fatigue, unexplained weight changes. Denies hearing loss, neck lumps or masses, mouth sores, ringing in ears or voice changes. Denies cough or wheezing.  Denies shortness of breath. Denies chest pain or palpitations. Denies leg swelling. Denies abdominal distention, pain, blood in stools, constipation, diarrhea, nausea, vomiting, or early satiety. Denies pain with intercourse, dysuria, frequency, hematuria or incontinence. Denies hot flashes,  pelvic pain, vaginal bleeding or vaginal discharge.   Denies joint pain, back pain or muscle pain/cramps. Denies itching, rash, or wounds. Denies dizziness, headaches, numbness or seizures. Denies swollen lymph nodes or glands, denies easy bruising or bleeding. Denies anxiety, depression, confusion, or decreased concentration.  Physical Exam: BP (!) 150/83 (BP Location: Right Arm, Patient Position: Sitting) Comment: 2nd bp check  Pulse 65   Temp 98.3 F (36.8 C) (Oral)   Resp 18   Ht 5' (1.524 m)   Wt 115 lb 3.2 oz (52.3 kg)   SpO2 100%   BMI 22.50 kg/m  General: Alert, oriented, no acute distress. HEENT: Normocephalic, atraumatic, sclera anicteric. Chest: Unlabored breathing on room air. Abdomen: soft, nontender.  Normoactive bowel sounds.  No masses or hepatosplenomegaly appreciated.  Well-healed incisions. GU: Pigmentation of a 4 x 5 [place] area along the right mons, unchanged.  No areas of leukoplakia or desquamation posteriorly.  The skin along the perineum is somewhat thinned but otherwise normal in appearance.  Hemorrhoids noted.  No other vulvar areas concerning for dysplasia.  Several hypopigmented lesions consistent with prior laser therapy.  No areas of acetowhite after application of acetic acid.  Laboratory & Radiologic Studies: None new  Assessment & Plan: Danielle Obrien is a 42 y.o. 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  Developed symptoms at the site of her wide local excision with biopsy proven VIN3 resected in 07/2022 with negative margins.  Patient is doing very well.  No visible lesions concerning for dysplasia today.  Although patient has palpated intermittently some vulvar lesions, none of these are appreciated by the patient or myself today on exam.  Plan for surveillance visit in 4 months.  Reviewed signs and symptoms that should prompt a phone call sooner than that.   20 minutes of total time was spent for  this patient encounter, including preparation, face-to-face counseling with the patient and coordination of care,  and documentation of the encounter.  Eugene Garnet, Obrien  Division of Gynecologic Oncology  Department of Obstetrics and Gynecology  [facility]

## 2023-08-26 ENCOUNTER — Other Ambulatory Visit (HOSPITAL_COMMUNITY)
Admission: RE | Admit: 2023-08-26 | Discharge: 2023-08-26 | Disposition: A | Discharge status: Home / Self Care | Payer: Managed Care, Other (non HMO) | Source: Ambulatory Visit | Attending: Gynecologic Oncology | Admitting: Gynecologic Oncology

## 2023-08-26 ENCOUNTER — Inpatient Hospital Stay: Payer: Managed Care, Other (non HMO) | Attending: Gynecologic Oncology | Admitting: Gynecologic Oncology

## 2023-08-26 ENCOUNTER — Encounter: Payer: Self-pay | Admitting: Gynecologic Oncology

## 2023-08-26 VITALS — BP 150/90 | HR 65 | Temp 98.3°F | Resp 18 | Ht 60.0 in | Wt 115.2 lb

## 2023-08-26 DIAGNOSIS — D071 Carcinoma in situ of vulva: Secondary | ICD-10-CM | POA: Insufficient documentation

## 2023-08-26 DIAGNOSIS — Z86002 Personal history of in-situ neoplasm of other and unspecified genital organs: Secondary | ICD-10-CM | POA: Diagnosis present

## 2023-08-26 DIAGNOSIS — Z87410 Personal history of cervical dysplasia: Secondary | ICD-10-CM | POA: Insufficient documentation

## 2023-08-26 DIAGNOSIS — R87810 Cervical high risk human papillomavirus (HPV) DNA test positive: Secondary | ICD-10-CM | POA: Insufficient documentation

## 2023-08-26 NOTE — Addendum Note (Signed)
Addended by: Warner Mccreedy D on: 08/26/2023 02:28 PM   Modules accepted: Orders

## 2023-08-26 NOTE — Patient Instructions (Signed)
It was good to see you today.  I do not see any new spots concerning for precancer on your vulva.  Please call if you develop any new symptoms before your next visit.  Otherwise I will see you in 4 months.

## 2023-08-30 ENCOUNTER — Other Ambulatory Visit: Payer: Self-pay | Admitting: Internal Medicine

## 2023-08-30 DIAGNOSIS — F3177 Bipolar disorder, in partial remission, most recent episode mixed: Secondary | ICD-10-CM

## 2023-08-31 LAB — CYTOLOGY - PAP
Comment: NEGATIVE
Comment: NEGATIVE
Comment: NEGATIVE
Diagnosis: UNDETERMINED — AB
HPV 16: NEGATIVE
HPV 18 / 45: NEGATIVE
High risk HPV: POSITIVE — AB

## 2023-09-01 ENCOUNTER — Other Ambulatory Visit: Payer: Self-pay | Admitting: Cardiology

## 2023-09-01 ENCOUNTER — Telehealth: Payer: Self-pay | Admitting: Gynecologic Oncology

## 2023-09-01 ENCOUNTER — Other Ambulatory Visit: Payer: Self-pay | Admitting: Gynecologic Oncology

## 2023-09-01 DIAGNOSIS — Z954 Presence of other heart-valve replacement: Secondary | ICD-10-CM

## 2023-09-01 DIAGNOSIS — Z7901 Long term (current) use of anticoagulants: Secondary | ICD-10-CM

## 2023-09-01 DIAGNOSIS — F418 Other specified anxiety disorders: Secondary | ICD-10-CM

## 2023-09-01 DIAGNOSIS — Q2381 Bicuspid aortic valve: Secondary | ICD-10-CM

## 2023-09-01 MED ORDER — LORAZEPAM 0.5 MG PO TABS: 0.5000 mg | ORAL_TABLET | Freq: Once | ORAL | 0 refills | Status: AC

## 2023-09-01 MED ORDER — LORAZEPAM 0.5 MG PO TABS
0.5000 mg | ORAL_TABLET | Freq: Three times a day (TID) | ORAL | 0 refills | Status: DC
Start: 2023-09-01 — End: 2023-09-01

## 2023-09-01 NOTE — Progress Notes (Signed)
HI all - patient will need colposcopy. I'm going to send her a message but could someone reach out today or tomorrow about scheduling this? Thank you

## 2023-09-01 NOTE — Progress Notes (Signed)
Resent ativan for one time dose

## 2023-09-01 NOTE — Telephone Encounter (Signed)
Called patient and discussed recent ASCUS pap, HR HPV +. Recommend colposcopy. She's amenable. Let her know office will call soon to get her to get this scheduled. Dose of ativan sent for her to take before procedure given anxiety.  Eugene Garnet MD Gynecologic Oncology

## 2023-09-03 ENCOUNTER — Telehealth: Payer: Self-pay | Admitting: *Deleted

## 2023-09-03 NOTE — Telephone Encounter (Signed)
Spoke with Ms. Riehl and relayed message from Dr. Pricilla Holm that we need to schedule patient for a colposcopy. Pt was given an appointment for Thursday, November 14 th at 0800. Pt agreed to date and time and had no concerns or questions at this time.

## 2023-09-03 NOTE — Telephone Encounter (Signed)
-----   Message from Carver Fila sent at 09/01/2023  1:40 PM EDT ----- HI all - patient will need colposcopy. I'm going to send her a message but could someone reach out today or tomorrow about scheduling this? Thank you

## 2023-09-07 ENCOUNTER — Other Ambulatory Visit: Payer: Self-pay | Admitting: Family Medicine

## 2023-09-09 ENCOUNTER — Ambulatory Visit: Payer: Managed Care, Other (non HMO) | Attending: Cardiology

## 2023-09-09 ENCOUNTER — Ambulatory Visit (INDEPENDENT_AMBULATORY_CARE_PROVIDER_SITE_OTHER): Payer: Managed Care, Other (non HMO) | Admitting: Internal Medicine

## 2023-09-09 ENCOUNTER — Encounter: Payer: Self-pay | Admitting: Internal Medicine

## 2023-09-09 VITALS — BP 160/88 | HR 74 | Temp 98.2°F | Ht 60.0 in | Wt 111.0 lb

## 2023-09-09 DIAGNOSIS — Z0000 Encounter for general adult medical examination without abnormal findings: Secondary | ICD-10-CM

## 2023-09-09 DIAGNOSIS — F3177 Bipolar disorder, in partial remission, most recent episode mixed: Secondary | ICD-10-CM

## 2023-09-09 DIAGNOSIS — Z5181 Encounter for therapeutic drug level monitoring: Secondary | ICD-10-CM

## 2023-09-09 DIAGNOSIS — D696 Thrombocytopenia, unspecified: Secondary | ICD-10-CM

## 2023-09-09 DIAGNOSIS — I10 Essential (primary) hypertension: Secondary | ICD-10-CM

## 2023-09-09 DIAGNOSIS — D751 Secondary polycythemia: Secondary | ICD-10-CM

## 2023-09-09 DIAGNOSIS — Z0001 Encounter for general adult medical examination with abnormal findings: Secondary | ICD-10-CM

## 2023-09-09 DIAGNOSIS — Z954 Presence of other heart-valve replacement: Secondary | ICD-10-CM

## 2023-09-09 DIAGNOSIS — R7989 Other specified abnormal findings of blood chemistry: Secondary | ICD-10-CM

## 2023-09-09 DIAGNOSIS — Z7901 Long term (current) use of anticoagulants: Secondary | ICD-10-CM | POA: Diagnosis not present

## 2023-09-09 LAB — HEPATIC FUNCTION PANEL
ALT: 16 U/L (ref 0–35)
AST: 23 U/L (ref 0–37)
Albumin: 4.8 g/dL (ref 3.5–5.2)
Alkaline Phosphatase: 110 U/L (ref 39–117)
Bilirubin, Direct: 0.1 mg/dL (ref 0.0–0.3)
Total Bilirubin: 0.5 mg/dL (ref 0.2–1.2)
Total Protein: 7.3 g/dL (ref 6.0–8.3)

## 2023-09-09 LAB — CBC WITH DIFFERENTIAL/PLATELET
Basophils Absolute: 0.1 10*3/uL (ref 0.0–0.1)
Basophils Relative: 1.4 % (ref 0.0–3.0)
Eosinophils Absolute: 0.1 10*3/uL (ref 0.0–0.7)
Eosinophils Relative: 2 % (ref 0.0–5.0)
HCT: 49.9 % — ABNORMAL HIGH (ref 36.0–46.0)
Hemoglobin: 16.5 g/dL — ABNORMAL HIGH (ref 12.0–15.0)
Lymphocytes Relative: 37.1 % (ref 12.0–46.0)
Lymphs Abs: 2.1 10*3/uL (ref 0.7–4.0)
MCHC: 33.1 g/dL (ref 30.0–36.0)
MCV: 94.3 fL (ref 78.0–100.0)
Monocytes Absolute: 0.4 10*3/uL (ref 0.1–1.0)
Monocytes Relative: 7.3 % (ref 3.0–12.0)
Neutro Abs: 2.9 10*3/uL (ref 1.4–7.7)
Neutrophils Relative %: 52.2 % (ref 43.0–77.0)
Platelets: 137 10*3/uL — ABNORMAL LOW (ref 150.0–400.0)
RBC: 5.29 Mil/uL — ABNORMAL HIGH (ref 3.87–5.11)
RDW: 14 % (ref 11.5–15.5)
WBC: 5.6 10*3/uL (ref 4.0–10.5)

## 2023-09-09 LAB — VITAMIN B12: Vitamin B-12: 275 pg/mL (ref 211–911)

## 2023-09-09 LAB — POCT INR: INR: 2.1 (ref 2.0–3.0)

## 2023-09-09 LAB — TSH: TSH: 0.68 u[IU]/mL (ref 0.35–5.50)

## 2023-09-09 LAB — FOLATE: Folate: 9.7 ng/mL (ref >5.9)

## 2023-09-09 MED ORDER — INDAPAMIDE 1.25 MG PO TABS
1.2500 mg | ORAL_TABLET | Freq: Every day | ORAL | 0 refills | Status: DC
Start: 2023-09-09 — End: 2023-12-07

## 2023-09-09 MED ORDER — OLMESARTAN MEDOXOMIL 20 MG PO TABS
20.0000 mg | ORAL_TABLET | Freq: Every day | ORAL | 0 refills | Status: DC
Start: 2023-09-09 — End: 2023-12-07

## 2023-09-09 NOTE — Patient Instructions (Signed)
Description   *PLEASE PUT ON APPT NO ONSITE INTERPRETER NEEDED*   Continue taking the dose you have been taking, which is, warfarin 1.5 tablets daily except for 1 tablet on Monday, Wednesdays, and Fridays.  Stay consistent with greens each week (2 per week)  Recheck INR in 4 weeks  Coumadin Clinic [phone]

## 2023-09-09 NOTE — Patient Instructions (Signed)
Hypertension, Adult High blood pressure (hypertension) is when the force of blood pumping through the arteries is too strong. The arteries are the blood vessels that carry blood from the heart throughout the body. Hypertension forces the heart to work harder to pump blood and may cause arteries to become narrow or stiff. Untreated or uncontrolled hypertension can lead to a heart attack, heart failure, a stroke, kidney disease, and other problems. A blood pressure reading consists of a higher number over a lower number. Ideally, your blood pressure should be below 120/80. The first ("top") number is called the systolic pressure. It is a measure of the pressure in your arteries as your heart beats. The second ("bottom") number is called the diastolic pressure. It is a measure of the pressure in your arteries as the heart relaxes. What are the causes? The exact cause of this condition is not known. There are some conditions that result in high blood pressure. What increases the risk? Certain factors may make you more likely to develop high blood pressure. Some of these risk factors are under your control, including: Smoking. Not getting enough exercise or physical activity. Being overweight. Having too much fat, sugar, calories, or salt (sodium) in your diet. Drinking too much alcohol. Other risk factors include: Having a personal history of heart disease, diabetes, high cholesterol, or kidney disease. Stress. Having a family history of high blood pressure and high cholesterol. Having obstructive sleep apnea. Age. The risk increases with age. What are the signs or symptoms? High blood pressure may not cause symptoms. Very high blood pressure (hypertensive crisis) may cause: Headache. Fast or irregular heartbeats (palpitations). Shortness of breath. Nosebleed. Nausea and vomiting. Vision changes. Severe chest pain, dizziness, and seizures. How is this diagnosed? This condition is diagnosed by  measuring your blood pressure while you are seated, with your arm resting on a flat surface, your legs uncrossed, and your feet flat on the floor. The cuff of the blood pressure monitor will be placed directly against the skin of your upper arm at the level of your heart. Blood pressure should be measured at least twice using the same arm. Certain conditions can cause a difference in blood pressure between your right and left arms. If you have a high blood pressure reading during one visit or you have normal blood pressure with other risk factors, you may be asked to: Return on a different day to have your blood pressure checked again. Monitor your blood pressure at home for 1 week or longer. If you are diagnosed with hypertension, you may have other blood or imaging tests to help your health care provider understand your overall risk for other conditions. How is this treated? This condition is treated by making healthy lifestyle changes, such as eating healthy foods, exercising more, and reducing your alcohol intake. You may be referred for counseling on a healthy diet and physical activity. Your health care provider may prescribe medicine if lifestyle changes are not enough to get your blood pressure under control and if: Your systolic blood pressure is above 130. Your diastolic blood pressure is above 80. Your personal target blood pressure may vary depending on your medical conditions, your age, and other factors. Follow these instructions at home: Eating and drinking  Eat a diet that is high in fiber and potassium, and low in sodium, added sugar, and fat. An example of this eating plan is called the DASH diet. DASH stands for Dietary Approaches to Stop Hypertension. To eat this way: Eat   plenty of fresh fruits and vegetables. Try to fill one half of your plate at each meal with fruits and vegetables. Eat whole grains, such as whole-wheat pasta, brown rice, or whole-grain bread. Fill about one  fourth of your plate with whole grains. Eat or drink low-fat dairy products, such as skim milk or low-fat yogurt. Avoid fatty cuts of meat, processed or cured meats, and poultry with skin. Fill about one fourth of your plate with lean proteins, such as fish, chicken without skin, beans, eggs, or tofu. Avoid pre-made and processed foods. These tend to be higher in sodium, added sugar, and fat. Reduce your daily sodium intake. Many people with hypertension should eat less than 1,500 mg of sodium a day. Do not drink alcohol if: Your health care provider tells you not to drink. You are pregnant, may be pregnant, or are planning to become pregnant. If you drink alcohol: Limit how much you have to: 0-1 drink a day for women. 0-2 drinks a day for men. Know how much alcohol is in your drink. In the [place] one drink equals one 12 oz bottle of beer (355 mL), one 5 oz glass of wine (148 mL), or one 1 oz glass of hard liquor (44 mL). Lifestyle  Work with your health care provider to maintain a healthy body weight or to lose weight. Ask what an ideal weight is for you. Get at least 30 minutes of exercise that causes your heart to beat faster (aerobic exercise) most days of the week. Activities may include walking, swimming, or biking. Include exercise to strengthen your muscles (resistance exercise), such as Pilates or lifting weights, as part of your weekly exercise routine. Try to do these types of exercises for 30 minutes at least 3 days a week. Do not use any products that contain nicotine or tobacco. These products include cigarettes, chewing tobacco, and vaping devices, such as e-cigarettes. If you need help quitting, ask your health care provider. Monitor your blood pressure at home as told by your health care provider. Keep all follow-up visits. This is important. Medicines Take over-the-counter and prescription medicines only as told by your health care provider. Follow directions carefully. Blood  pressure medicines must be taken as prescribed. Do not skip doses of blood pressure medicine. Doing this puts you at risk for problems and can make the medicine less effective. Ask your health care provider about side effects or reactions to medicines that you should watch for. Contact a health care provider if you: Think you are having a reaction to a medicine you are taking. Have headaches that keep coming back (recurring). Feel dizzy. Have swelling in your ankles. Have trouble with your vision. Get help right away if you: Develop a severe headache or confusion. Have unusual weakness or numbness. Feel faint. Have severe pain in your chest or abdomen. Vomit repeatedly. Have trouble breathing. These symptoms may be an emergency. Get help right away. Call 911. Do not wait to see if the symptoms will go away. Do not drive yourself to the hospital. Summary Hypertension is when the force of blood pumping through your arteries is too strong. If this condition is not controlled, it may put you at risk for serious complications. Your personal target blood pressure may vary depending on your medical conditions, your age, and other factors. For most people, a normal blood pressure is less than 120/80. Hypertension is treated with lifestyle changes, medicines, or a combination of both. Lifestyle changes include losing weight, eating a healthy,   low-sodium diet, exercising more, and limiting alcohol. This information is not intended to replace advice given to you by your health care provider. Make sure you discuss any questions you have with your health care provider. Document Revised: 09/23/2021 Document Reviewed: 09/23/2021 Elsevier Patient Education  2024 [employer]  

## 2023-09-09 NOTE — Progress Notes (Signed)
Subjective:  Patient ID: LEZETTE PACIFIC, female    DOB: Nov 28, 1981  Age: 42 y.o. MRN: [id number]  CC: Annual Exam and Hypertension   HPI Danielle Obrien presents for a CPX and f/up  -  Discussed the use of AI scribe software for clinical note transcription with the patient, who gave verbal consent to proceed.  History of Present Illness   The patient, with a history of gallbladder removal and heart disease, presents with severe stress-induced headaches that have been ongoing for the past four months. The headaches have been exacerbated by financial stress due to being out of work and difficulties with paperwork for disability benefits. The patient denies experiencing chest pain, shortness of breath, or bleeding.  The patient's gallbladder symptoms have resolved post-surgery, and she is currently adjusting to a new diet. However, she was hospitalized for an extended period due to low platelet counts and gallstones, which were reportedly caused by her medication regimen, including gabapentin and Lamictal. The patient denies any bleeding or bruising associated with the low platelet count.  The patient is currently working with another physician to reduce her gabapentin dosage. She expresses frustration with the administrative aspects of her care, particularly regarding the completion of necessary paperwork for disability benefits. The patient is eager to return to work but is awaiting the completion of this paperwork to avoid financial complications.       Outpatient Medications Prior to Visit  Medication Sig Dispense Refill   albuterol (VENTOLIN HFA) 108 (90 Base) MCG/ACT inhaler Inhale 2 puffs into the lungs every 6 (six) hours as needed for wheezing or shortness of breath. 1 each 3   aspirin EC 81 MG tablet Take 81 mg by mouth daily. Swallow whole.     DULoxetine (CYMBALTA) 20 MG capsule TAKE 2 CAPSULES BY MOUTH DAILY 60 capsule 0   gabapentin (NEURONTIN) 300 MG capsule Take 2 capsules  (600 mg total) by mouth 3 (three) times daily. 540 capsule 1   lamoTRIgine (LAMICTAL) 200 MG tablet TAKE 2 TABLETS BY MOUTH DAILY 60 tablet 0   meclizine (ANTIVERT) 12.5 MG tablet TAKE ONE TABLET BY MOUTH TWICE A DAY AS NEEDED FOR DIZZINESS (Patient taking differently: 2 (two) times daily as needed.) 20 tablet 0   metoprolol tartrate (LOPRESSOR) 25 MG tablet Take 0.5 tablets (12.5 mg total) by mouth daily. Please keep scheduled appointment for future refills. Thank you. 90 tablet 3   rosuvastatin (CRESTOR) 20 MG tablet TAKE 1 TABLET BY MOUTH DAILY 90 tablet 0   tiZANidine (ZANAFLEX) 4 MG tablet TAKE 1 TABLET BY MOUTH AT BEDTIME 30 tablet 0   traMADol (ULTRAM) 50 MG tablet TAKE 2 TABLETS BY MOUTH TWICE A DAY 120 tablet 0   warfarin (COUMADIN) 7.5 MG tablet TAKE ONE TO ONE AND A HALF (1 TO 1.5) TABLETS BY MOUTH DAILY AS DIRECTED BY ANTICOAGULATION CLINIC *KEEP UPCOMING APPOINTMENT FOR REFILLS* 40 tablet 0   No facility-administered medications prior to visit.    ROS Review of Systems  Constitutional:  Negative for appetite change, chills, diaphoresis, fatigue and fever.  HENT:  Positive for hearing loss.   Eyes:  Negative for visual disturbance.  Respiratory: Negative.  Negative for cough, chest tightness, shortness of breath and wheezing.   Cardiovascular:  Negative for chest pain, palpitations and leg swelling.  Gastrointestinal: Negative.  Negative for abdominal pain, constipation, diarrhea, nausea and vomiting.  Genitourinary: Negative.  Negative for difficulty urinating.  Musculoskeletal: Negative.  Negative for arthralgias and myalgias.  Skin:  Negative.   Neurological:  Positive for headaches. Negative for dizziness, weakness and light-headedness.  Hematological:  Negative for adenopathy. Does not bruise/bleed easily.  Psychiatric/Behavioral:  Positive for confusion and dysphoric mood. Negative for sleep disturbance. The patient is not nervous/anxious.     Objective:  BP (!) 160/88  (BP Location: Left Arm, Patient Position: Sitting, Cuff Size: Normal)   Pulse 74   Temp 98.2 F (36.8 C) (Oral)   Ht 5' (1.524 m)   Wt 111 lb (50.3 kg)   SpO2 97%   BMI 21.68 kg/m   BP Readings from Last 3 Encounters:  09/09/23 (!) 160/88  08/26/23 (!) 150/90  08/05/23 (!) 130/90    Wt Readings from Last 3 Encounters:  09/09/23 111 lb (50.3 kg)  08/26/23 115 lb 3.2 oz (52.3 kg)  08/05/23 113 lb 6.4 oz (51.4 kg)    Physical Exam Vitals reviewed.  Constitutional:      Appearance: Normal appearance.  HENT:     Mouth/Throat:     Mouth: Mucous membranes are moist.  Eyes:     General: No scleral icterus.    Conjunctiva/sclera: Conjunctivae normal.  Cardiovascular:     Rate and Rhythm: Normal rate and regular rhythm.     Heart sounds: No murmur heard.    No friction rub. No gallop.     Comments: EKG- NSR, 69 bpm LAD,LAE Anterior infarct pattern No LVH Unchanged Pulmonary:     Breath sounds: No stridor. No wheezing, rhonchi or rales.  Abdominal:     General: Abdomen is flat.     Palpations: There is no mass.     Tenderness: There is no abdominal tenderness. There is no guarding.     Hernia: No hernia is present.  Musculoskeletal:     Cervical back: Neck supple.     Right lower leg: No edema.     Left lower leg: No edema.  Skin:    General: Skin is warm and dry.     Findings: No bruising or rash.  Neurological:     General: No focal deficit present.     Mental Status: She is alert and oriented to person, place, and time. Mental status is at baseline.  Psychiatric:        Attention and Perception: Attention normal.        Mood and Affect: Mood normal. Mood is not anxious or depressed. Affect is angry and tearful. Affect is not flat.        Speech: Speech normal.        Behavior: Behavior normal. Behavior is cooperative.        Thought Content: Thought content normal.        Cognition and Memory: Cognition normal.        Judgment: Judgment normal.     Lab  Results  Component Value Date   WBC 5.6 09/09/2023   HGB 16.5 (H) 09/09/2023   HCT 49.9 (H) 09/09/2023   PLT 137.0 (L) 09/09/2023   GLUCOSE 102 (H) 07/14/2023   CHOL 195 02/11/2023   TRIG 67.0 02/11/2023   HDL 77.90 02/11/2023   LDLDIRECT 171.0 01/27/2013   LDLCALC 104 (H) 02/11/2023   ALT 16 09/09/2023   AST 23 09/09/2023   NA 139 07/14/2023   K 4.2 07/14/2023   CL 100 07/14/2023   CREATININE 0.79 07/14/2023   BUN <5 (L) 07/14/2023   CO2 28 07/14/2023   TSH 0.68 09/09/2023   INR 2.1 09/09/2023   HGBA1C 5.3 12/22/2018  US Abdomen Limited  Result Date: 07/07/2023 CLINICAL DATA:  Right upper quadrant abdominal pain EXAM: ULTRASOUND ABDOMEN LIMITED RIGHT UPPER QUADRANT COMPARISON:  None Available. FINDINGS: Gallbladder: Multiple layering gallstones are seen within the gallbladder. The gallbladder is mildly distended. The sonographic Eulah Pont sign is reportedly POSITIVE. However, the gallbladder wall is not thickened and no pericholecystic fluid is seen. Common bile duct: Diameter: 3 mm in proximal diameter Liver: No focal lesion identified. Within normal limits in parenchymal echogenicity. Portal vein is patent on color Doppler imaging with normal direction of blood flow towards the liver. Other: None. IMPRESSION: 1. Cholelithiasis with mild gallbladder distention and a reportedly POSITIVE sonographic Murphy sign. Together, the findings may reflect early changes of acute cholecystitis. Correlation with liver enzymes and possible hepatobiliary scintigraphy may be helpful for further evaluation. Electronically Signed   By: Helyn Numbers M.D.   On: 07/07/2023 20:11    Assessment & Plan:   Essential hypertension, benign- She has not achieved her blood pressure goal and she is symptomatic.  Labs are negative for secondary causes or endorgan damage.  Will restart the ARB and will add a thiazide diuretic. -     Aldosterone + renin activity w/ ratio; Future -     EKG 12-Lead -     TSH;  Future -     Olmesartan Medoxomil; Take 1 tablet (20 mg total) by mouth daily. (Patient not taking: Reported on 09/16/2023)  Dispense: 90 tablet; Refill: 0 -     Indapamide; Take 1 tablet (1.25 mg total) by mouth daily. (Patient not taking: Reported on 09/16/2023)  Dispense: 90 tablet; Refill: 0 -     AMB Referral to Pharmacy Medication Management  Thrombocytopenia (HCC) -     Lamotrigine level; Future -     Vitamin B12; Future -     CBC with Differential/Platelet; Future -     Folate; Future -     Ambulatory referral to Hematology / Oncology  Encounter for general adult medical examination with abnormal findings - Exam completed, labs reviewed, vaccines reviewed, cancer screenings are UTD, pt ed material was given.   Elevated LFTs- Her LFTs have improved. -     Hepatic function panel; Future  Bipolar disorder, in partial remission, most recent episode mixed (HCC)  Encounter for therapeutic drug level monitoring- Her lamotrigine is in the therapeutic range. -     Lamotrigine level; Future  Erythrocytosis -     Ambulatory referral to Hematology / Oncology     Follow-up: Return in about 3 months (around 12/10/2023).  Sanda Linger, MD

## 2023-09-10 ENCOUNTER — Telehealth: Payer: Self-pay

## 2023-09-10 NOTE — Progress Notes (Signed)
Care Guide Note  09/10/2023 Name: Danielle Obrien MRN: [id number] DOB: May 20, 1981  Referred by: Etta Grandchild, MD Reason for referral : Care Coordination (Outreach to schedule with Pharm d )   Danielle Obrien is a 42 y.o. year old female who is a primary care patient of Etta Grandchild, MD. Danielle Obrien was referred to the pharmacist for assistance related to HTN.    Successful contact was made with the patient to discuss pharmacy services including being ready for the pharmacist to call at least 5 minutes before the scheduled appointment time, to have medication bottles and any blood sugar or blood pressure readings ready for review. The patient agreed to meet with the pharmacist via with the pharmacist via telephone visit on (date/time).  09/16/2023  Danielle Obrien, RMA Care Guide [facility]  [place], [place] [address] Direct Dial: [phone] [email or web address]

## 2023-09-16 ENCOUNTER — Other Ambulatory Visit: Payer: Managed Care, Other (non HMO) | Admitting: Pharmacist

## 2023-09-16 DIAGNOSIS — I10 Essential (primary) hypertension: Secondary | ICD-10-CM

## 2023-09-16 NOTE — Patient Instructions (Signed)
It was a pleasure speaking with you today!  Check blood pressure daily for 2 weeks. I will call on 10/31 to go over home blood pressure readings and discuss is treatment is recommended.  Feel free to call with any questions or concerns!  Arbutus Leas, PharmD, BCPS [facility] Clinical Pharmacist [facility] [phone]

## 2023-09-16 NOTE — Progress Notes (Signed)
09/16/2023 Name: Danielle Obrien MRN: [id number] DOB: 1981-08-29  Chief Complaint  Patient presents with   Hypertension   Medication Management    Danielle Obrien is a 42 y.o. year old female who presented for a telephone visit.   They were referred to the pharmacist by their PCP for assistance in managing hypertension.   Subjective:  Care Team: Primary Care Provider: Etta Grandchild, MD ; Next Scheduled Visit: 12/15/2022  Medication Access/Adherence  Current Pharmacy:  [id number] - [place], [place] - [address] [place] [address] Phone: [phone] Fax: [phone]  [id number] - [place], [place] - [address] AT [facility] [address] [place] [address] Phone: [phone] Fax: [phone]  [facility] [address] [place] [address] Phone: [phone] Fax: [phone]   Patient reports affordability concerns with their medications: No  Patient reports access/transportation concerns to their pharmacy: No  Patient reports adherence concerns with their medications:  No     Hypertension:  Current medications: metoprolol tartrate 25 mg 1/2 tablet daily (migraine prevention?) **Pt did not start indapamide or olmesartan prescribed 10/10. She notes that she was very upset/crying right when they checked her BP and she has been under a lot of stress lately (has been out of work ~4 months). She is weary to take extra medication based on the office BP when she was upset.  Patient has a validated, automated, upper arm home BP cuff Current home blood pressure readings: 131/79 a couple of days ago   Objective:  BP Readings from Last 3 Encounters:  09/09/23 (!) 160/88  08/26/23 (!) 150/90  08/05/23 (!) 130/90     Lab Results  Component Value Date   HGBA1C 5.3 12/22/2018    Lab Results  Component Value Date   CREATININE 0.79  07/14/2023   BUN <5 (L) 07/14/2023   NA 139 07/14/2023   K 4.2 07/14/2023   CL 100 07/14/2023   CO2 28 07/14/2023    Lab Results  Component Value Date   CHOL 195 02/11/2023   HDL 77.90 02/11/2023   LDLCALC 104 (H) 02/11/2023   LDLDIRECT 171.0 01/27/2013   TRIG 67.0 02/11/2023   CHOLHDL 3 02/11/2023    Medications Reviewed Today     Reviewed by [name], RPH (Pharmacist) on 09/16/23 at 1047  Med List Status: <None>   Medication Order Taking? Sig Documenting Provider Last Dose Status Informant  albuterol (VENTOLIN HFA) 108 (90 Base) MCG/ACT inhaler [id number] Yes Inhale 2 puffs into the lungs every 6 (six) hours as needed for wheezing or shortness of breath. Etta Grandchild, MD Taking Active Self, Pharmacy Records  aspirin EC 81 MG tablet [id number] Yes Take 81 mg by mouth daily. Swallow whole. [provider] Taking Active   DULoxetine (CYMBALTA) 20 MG capsule [id number] Yes TAKE 2 CAPSULES BY MOUTH DAILY Judi Saa, DO Taking Active   gabapentin (NEURONTIN) 300 MG capsule [id number] Yes Take 2 capsules (600 mg total) by mouth 3 (three) times daily. Judi Saa, DO Taking Active Self, Pharmacy Records  indapamide (LOZOL) 1.25 MG tablet [id number] No Take 1 tablet (1.25 mg total) by mouth daily.  Patient not taking: Reported on 09/16/2023   Etta Grandchild, MD Not Taking Active   lamoTRIgine (LAMICTAL) 200 MG tablet [id number] Yes TAKE 2 TABLETS BY MOUTH DAILY Etta Grandchild, MD Taking Active   meclizine (ANTIVERT) 12.5 MG  tablet [id number] Yes TAKE ONE TABLET BY MOUTH TWICE A DAY AS NEEDED FOR DIZZINESS  Patient taking differently: 2 (two) times daily as needed.   Judi Saa, DO Taking Active Self, Pharmacy Records  metoprolol tartrate (LOPRESSOR) 25 MG tablet [id number] Yes Take 0.5 tablets (12.5 mg total) by mouth daily. Please keep scheduled appointment for future refills. Thank you. Quintella Reichert, MD Taking Active   olmesartan (BENICAR) 20 MG  tablet [id number] No Take 1 tablet (20 mg total) by mouth daily.  Patient not taking: Reported on 09/16/2023   Etta Grandchild, MD Not Taking Active   rosuvastatin (CRESTOR) 20 MG tablet [id number] Yes TAKE 1 TABLET BY MOUTH DAILY Etta Grandchild, MD Taking Active Self, Pharmacy Records  tiZANidine (ZANAFLEX) 4 MG tablet [id number] Yes TAKE 1 TABLET BY MOUTH AT BEDTIME Judi Saa, DO Taking Active Self, Pharmacy Records  traMADol (ULTRAM) 50 MG tablet [id number] Yes TAKE 2 TABLETS BY MOUTH TWICE A DAY Antoine Primas M, DO Taking Active   warfarin (COUMADIN) 7.5 MG tablet [id number] Yes TAKE ONE TO ONE AND A HALF (1 TO 1.5) TABLETS BY MOUTH DAILY AS DIRECTED BY ANTICOAGULATION CLINIC *KEEP UPCOMING APPOINTMENT FOR REFILLS* Quintella Reichert, MD Taking Active               Assessment/Plan:   Hypertension: - Currently uncontrolled, BP goal <130/80 - Recommended to check home blood pressure and heart rate daily for 2 weeks. - Will follow up by phone in 2 weeks to get BP readings and discuss if treatment is recommended based on home readings   Follow Up Plan: 09/30/2023  Arbutus Leas, PharmD, BCPS [facility] [phone]

## 2023-09-16 NOTE — Telephone Encounter (Signed)
Unable to get Sign language interpreter; Called both contacts listed on account, no answer. Left message to contact office to schedule.

## 2023-09-17 LAB — LAMOTRIGINE LEVEL: Lamotrigine Lvl: 13 ug/mL (ref 2.5–15.0)

## 2023-09-17 LAB — ALDOSTERONE + RENIN ACTIVITY W/ RATIO
ALDO / PRA Ratio: 3.3 {ratio} (ref 0.9–28.9)
Aldosterone: 5 ng/dL
Renin Activity: 1.5 ng/mL/h (ref 0.25–5.82)

## 2023-09-28 ENCOUNTER — Other Ambulatory Visit: Payer: Self-pay | Admitting: Internal Medicine

## 2023-09-28 DIAGNOSIS — F3177 Bipolar disorder, in partial remission, most recent episode mixed: Secondary | ICD-10-CM

## 2023-09-30 ENCOUNTER — Other Ambulatory Visit: Payer: Managed Care, Other (non HMO) | Admitting: Pharmacist

## 2023-09-30 NOTE — Progress Notes (Signed)
Stress with new job    09/30/2023 Name: Danielle Obrien MRN: [id number] DOB: 16-Jan-1981  Chief Complaint  Patient presents with   Hypertension   Medication Management    DAMISHA HEIDEN is a 42 y.o. year old female who presented for a telephone visit.   They were referred to the pharmacist by their PCP for assistance in managing hypertension.   Subjective:  Care Team: Primary Care Provider: Etta Grandchild, MD ; Next Scheduled Visit: 12/15/2022  Medication Access/Adherence  Current Pharmacy:  [id number] - [place], [place] - [address] [place] [address] Phone: [phone] Fax: [phone]  [id number] - [place], [place] - [address] AT [facility] [address] [place] [address] Phone: [phone] Fax: [phone]  [facility] [address] [place] [address] Phone: [phone] Fax: [phone]   Patient reports affordability concerns with their medications: No  Patient reports access/transportation concerns to their pharmacy: No  Patient reports adherence concerns with their medications:  No     Hypertension:  Current medications: metoprolol tartrate 25 mg 1/2 tablet daily (migraine prevention?) **Pt did not start indapamide or olmesartan prescribed 10/10. She notes that she was very upset/crying right when they checked her BP and she has been under a lot of stress lately (has been out of work ~4 months). She is weary to take extra medication based on the office BP when she was upset.  Patient has a validated, automated, upper arm home BP cuff Current home blood pressure readings: Avg 127-147/76-87 over the last 2 weeks. She attributes the higher readings to stress from work. She recently started working again [employer] job) and notes it has been stressful getting back into working. She feels this will get better over time and  after the holiday rush.   Objective:  BP Readings from Last 3 Encounters:  09/09/23 (!) 160/88  08/26/23 (!) 150/90  08/05/23 (!) 130/90     Lab Results  Component Value Date   HGBA1C 5.3 12/22/2018    Lab Results  Component Value Date   CREATININE 0.79 07/14/2023   BUN <5 (L) 07/14/2023   NA 139 07/14/2023   K 4.2 07/14/2023   CL 100 07/14/2023   CO2 28 07/14/2023    Lab Results  Component Value Date   CHOL 195 02/11/2023   HDL 77.90 02/11/2023   LDLCALC 104 (H) 02/11/2023   LDLDIRECT 171.0 01/27/2013   TRIG 67.0 02/11/2023   CHOLHDL 3 02/11/2023    Medications Reviewed Today   Medications were not reviewed in this encounter      Assessment/Plan:   Hypertension: - Currently uncontrolled, BP goal <130/80 - Recommended to check home blood pressure and heart rate daily - BP readings are borderline. Pt wishes to continue monitoring for now. We will f/u in January. - Educated on risks of keeping elevated BP uncontrolled. Recommended to contact me if she is seeing BP readings in the 150s or higher, even if stress related, as we should not delay treatment if having frequent elevated readings this high.   Follow Up Plan: 12/09/2023  Arbutus Leas, PharmD, BCPS [facility] [phone]

## 2023-10-01 ENCOUNTER — Other Ambulatory Visit: Payer: Self-pay | Admitting: Cardiology

## 2023-10-01 DIAGNOSIS — Z7901 Long term (current) use of anticoagulants: Secondary | ICD-10-CM

## 2023-10-01 DIAGNOSIS — Q2381 Bicuspid aortic valve: Secondary | ICD-10-CM

## 2023-10-01 DIAGNOSIS — Z954 Presence of other heart-valve replacement: Secondary | ICD-10-CM

## 2023-10-01 NOTE — Telephone Encounter (Signed)
Prescription refill request received for warfarin Lov: 08/05/23 Mayford Knife)  Next INR check: 10/07/23 Warfarin tablet strength: 7.5mg   Appropriate dose. Refill sent.

## 2023-10-05 ENCOUNTER — Other Ambulatory Visit: Payer: Self-pay | Admitting: Family Medicine

## 2023-10-07 ENCOUNTER — Other Ambulatory Visit: Payer: Self-pay

## 2023-10-07 ENCOUNTER — Inpatient Hospital Stay: Payer: Managed Care, Other (non HMO) | Attending: Gynecologic Oncology | Admitting: Nurse Practitioner

## 2023-10-07 ENCOUNTER — Inpatient Hospital Stay: Payer: Managed Care, Other (non HMO)

## 2023-10-07 ENCOUNTER — Ambulatory Visit: Payer: Managed Care, Other (non HMO) | Attending: Cardiology | Admitting: *Deleted

## 2023-10-07 VITALS — BP 124/78 | HR 85 | Temp 98.4°F | Resp 17 | Wt 109.8 lb

## 2023-10-07 DIAGNOSIS — Q230 Congenital stenosis of aortic valve: Secondary | ICD-10-CM | POA: Diagnosis not present

## 2023-10-07 DIAGNOSIS — Z7901 Long term (current) use of anticoagulants: Secondary | ICD-10-CM

## 2023-10-07 DIAGNOSIS — Z954 Presence of other heart-valve replacement: Secondary | ICD-10-CM | POA: Diagnosis not present

## 2023-10-07 DIAGNOSIS — R87820 Cervical low risk human papillomavirus (HPV) DNA test positive: Secondary | ICD-10-CM | POA: Diagnosis not present

## 2023-10-07 DIAGNOSIS — D649 Anemia, unspecified: Secondary | ICD-10-CM

## 2023-10-07 DIAGNOSIS — D696 Thrombocytopenia, unspecified: Secondary | ICD-10-CM | POA: Insufficient documentation

## 2023-10-07 DIAGNOSIS — D071 Carcinoma in situ of vulva: Secondary | ICD-10-CM

## 2023-10-07 DIAGNOSIS — C9000 Multiple myeloma not having achieved remission: Secondary | ICD-10-CM

## 2023-10-07 DIAGNOSIS — Q2381 Bicuspid aortic valve: Secondary | ICD-10-CM | POA: Diagnosis not present

## 2023-10-07 DIAGNOSIS — R87611 Atypical squamous cells cannot exclude high grade squamous intraepithelial lesion on cytologic smear of cervix (ASC-H): Secondary | ICD-10-CM | POA: Diagnosis not present

## 2023-10-07 LAB — POCT INR: INR: 1.3 — AB (ref 2.0–3.0)

## 2023-10-07 LAB — CBC WITH DIFFERENTIAL (CANCER CENTER ONLY)
Abs Immature Granulocytes: 0.01 10*3/uL (ref 0.00–0.07)
Basophils Absolute: 0 10*3/uL (ref 0.0–0.1)
Basophils Relative: 1 %
Eosinophils Absolute: 0.1 10*3/uL (ref 0.0–0.5)
Eosinophils Relative: 2 %
HCT: 40.8 % (ref 36.0–46.0)
Hemoglobin: 14.3 g/dL (ref 12.0–15.0)
Immature Granulocytes: 0 %
Lymphocytes Relative: 44 %
Lymphs Abs: 2.2 10*3/uL (ref 0.7–4.0)
MCH: 32.1 pg (ref 26.0–34.0)
MCHC: 35 g/dL (ref 30.0–36.0)
MCV: 91.5 fL (ref 80.0–100.0)
Monocytes Absolute: 0.3 10*3/uL (ref 0.1–1.0)
Monocytes Relative: 6 %
Neutro Abs: 2.3 10*3/uL (ref 1.7–7.7)
Neutrophils Relative %: 47 %
Platelet Count: 126 10*3/uL — ABNORMAL LOW (ref 150–400)
RBC: 4.46 MIL/uL (ref 3.87–5.11)
RDW: 13.1 % (ref 11.5–15.5)
WBC Count: 4.9 10*3/uL (ref 4.0–10.5)
nRBC: 0 % (ref 0.0–0.2)

## 2023-10-07 LAB — IMMATURE PLATELET FRACTION: Immature Platelet Fraction: 7.1 % (ref 1.2–8.6)

## 2023-10-07 LAB — FOLATE: Folate: 6.5 ng/mL (ref >5.9)

## 2023-10-07 LAB — VITAMIN B12: Vitamin B-12: 315 pg/mL (ref 180–914)

## 2023-10-07 NOTE — Progress Notes (Addendum)
[facility]   [phone] Fax:([phone]) [phone]   Clinic New consult Note   Patient Care Team: Etta Grandchild, MD as PCP - General (Internal Medicine) Quintella Reichert, MD as PCP - Cardiology (Cardiology) Corwin Levins, MD as Consulting Physician (Internal Medicine) Patton Salles, MD as Consulting Physician (Obstetrics and Gynecology) [name], [facility] (Pharmacist) 10/07/2023  CHIEF COMPLAINTS/PURPOSE OF CONSULTATION:  Erythrocytosis and thrombocytopenia   ASSESSMENT & PLAN:  1. Thrombocytopenia (HCC) Assessment & Plan: We discussed common etiology of erythrocytosis (PV and secondary polycythemia), and thrombocytopenia, such as ITP, chronic hepatitis, liver disease, splenomegaly, medication induced, B12 deficiency, folate deficiency, and primary pulmonary disease.  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   -lab check today includes immature platelet count, B12, folate, erythropoietin level, and MPN panel.  -prior labs were negative for HIV and hepatitis C.  Will call her with lab results.   -She is on Coumadin, so risk of thrombosis from erythrocytosis is low.  We discussed the risks of bleeding from thrombocytopenia since she is on Coumadin.She is followed by cardiology for INR monitoring and management of coumadin.  -if her platelet count is persistently less than 50K, she may require treatment such as platelet transfusion in the future.       HISTORY OF PRESENTING ILLNESS:  Danielle Obrien 42 y.o. female is here because of thrombocytopenia and erythrocytosis. Danielle Obrien is a 42 y.o. female with a history of aortic valve stenosis with aortic valve replacement, attention deficit, hypertension, arthritis, asthma, and cervical dysplasia. She is anti-coagulated with warfarin and sees cardiology routinely. She was found to have low platelet count during hospitalization after gallbladder  removal in 07/2023. She states that she was unable to be discharged from the hospital until they did more investigation into reason for the thrombocytopenia. Ultimately, medications were deemed to be the cause. Specifically, gabapentin, lamotrigine, and warfarin are contributing to the low platelet count. She is unable to come off warfarin due to aortic valve replacement. She states that she has been on gabapentin and lamotrigine for more than 20 years and has not had any problems until now. She denies unusual bleeding or bruising. She denies hematuria  She was found to have abnormal CBC from 07/2023 during hospitalization. Platelet count has been low since 09/2022.  She denies recent chest pain on exertion, shortness of breath on minimal exertion, pre-syncopal episodes, or palpitations. She had not noticed any recent bleeding such as epistaxis, hematuria or hematochezia. She does get some bruising as she takes warfarin due to aortic valve replacement.  The patient denies over the counter NSAID ingestion. She is warfarin. Her last colonoscopy was n/a due to age She had no prior history or diagnosis of cancer. Her age appropriate screening programs are up-to-date. She denies any pica and eats a variety of diet. She never donated blood or received blood transfusion    REVIEW OF SYSTEMS:   Constitutional: Denies fevers, chills or abnormal night sweats Eyes: Denies blurriness of vision, double vision or watery eyes Ears, nose, mouth, throat, and face: Denies mucositis or sore throat Respiratory: Denies cough, dyspnea or wheezes Cardiovascular: Denies palpitation, chest discomfort or lower extremity swelling Gastrointestinal:  Denies nausea, heartburn or change in bowel habits Skin: Denies abnormal skin rashes Lymphatics: Denies new lymphadenopathy or easy bruising Neurological:Denies numbness, tingling or new weaknesses Behavioral/Psych: Mood is stable, no new changes   All other systems were  reviewed with  the patient and are negative.   MEDICAL HISTORY:  Past Medical History:  Diagnosis Date   ADD (attention deficit disorder)    Alcohol abuse, in remission 2012   per pt in remission since 2012   Anticoagulated on Coumadin    coumadin--- managed by cardiology/ coumadin clinic   Arthritis    In hips   Asthma    allergy induced, rare inhaler use   Auditory processing disorder    per pt very HOH, first language ASL   Bipolar disorder (HCC)    Chronic left hip pain    Chronic vertigo    Complication of anesthesia    per pt due to auditory processing disorder pt is unable to hear when waking up and also gets very anxious   Family history of uterine cancer    GAD (generalized anxiety disorder)    GERD (gastroesophageal reflux disease)    History of cervical dysplasia    History of condyloma acuminatum 2017   s/p laser ablation vulva   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   biopsy and CO2 laser ablation   HOH (hard of hearing)    per pt very HOH due to auditroy processing disorder   Hyperlipidemia    Hypertension    Muscle spasms of both lower extremities    both hips   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   23 mm Sorin [employer] [place] bileaflet mechanical valve via right anterior mini thoracotomy   Vulvar dysplasia     SURGICAL HISTORY: Past Surgical History:  Procedure Laterality Date   ANTERIOR CRUCIATE LIGAMENT REPAIR  1993   AORTIC VALVE REPLACEMENT N/A 11/03/2017   Procedure: MINIMALLY INVASIVE AORTIC VALVE REPLACEMENT( MINI THORACOTOMY);  Surgeon: Purcell Nails, MD;  Location: [facility] OR;  Service: Open Heart Surgery;  Laterality: N/A;   CERVICAL BIOPSY  W/ LOOP ELECTRODE EXCISION  2010   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 ECC   COLPOSCOPY  06/30/2001   CIN I   COLPOSCOPY  08/30/2004   ECC--atypia   COLPOSCOPY N/A 01/21/2016   Procedure: COLPOSCOPY with vaginal biopsy with CO 2 Laser of Vaginal and vulvar condyloma;  Surgeon: Patton Salles, MD;  Location: [facility] ORS;  Service: Gynecology;  Laterality: N/A;  [name] will be here 2/21 for 1115 case confirmed 01/16/15 - TS   HARDWARE REMOVAL Left 1992   Left hip   INGUINAL HERNIA REPAIR Bilateral    1981;  [id number]   LEFT AND RIGHT HEART CATHETERIZATION WITH CORONARY ANGIOGRAM N/A 05/18/2014   Procedure: LEFT AND RIGHT HEART CATHETERIZATION WITH CORONARY ANGIOGRAM;  Surgeon: [name] Crafts, MD;  Location: [facility] CATH LAB;  Service: Cardiovascular;  Laterality: N/A;   LESION REMOVAL Right 05/12/2022   Procedure: Sharen Hint OF RIGHT CHEEK CYST;  Surgeon: Allena Napoleon, MD;  Location: [facility];  Service: Plastics;  Laterality: Right;   LYMPH NODE BIOPSY  1995   NASAL  SEPTUM SURGERY  2002   ORIF HIP FRACTURE Left 1990   per pt and  took out growth plate in Right knee   ROBOTIC ASSISTED TOTAL HYSTERECTOMY  02/26/2009   @[facility]    TEE WITHOUT CARDIOVERSION N/A 11/03/2017   Procedure: TRANSESOPHAGEAL ECHOCARDIOGRAM (TEE);  Surgeon: Purcell Nails, MD;  Location: [facility] OR;  Service: Open Heart Surgery;  Laterality: N/A;   TONSILLECTOMY AND ADENOIDECTOMY  1990   TYMPANOSTOMY TUBE PLACEMENT Bilateral    x3  per pt last one 1982   VULVA /PERINEUM BIOPSY N/A 05/12/2022   Procedure: POSSIBLE VULVAR BIOPSY; POSSIBLE VAGINAL BIOPSY;  Surgeon: Carver Fila, MD;  Location: [facility];  Service: Gynecology;   Laterality: N/A;   VULVECTOMY N/A 05/12/2022   Procedure: WIDE EXCISION VULVECTOMY;  Surgeon: Carver Fila, MD;  Location: [facility];  Service: Gynecology;  Laterality: N/A;   VULVECTOMY N/A 08/12/2022   Procedure: WIDE LOCAL EXCISION VULVECTOMY;  Surgeon: Carver Fila, MD;  Location: [facility];  Service: Gynecology;  Laterality: N/A;    SOCIAL HISTORY: Social History   Socioeconomic History   Marital status: Single    Spouse name: Not on file   Number of children: Not on file   Years of education: Not on file   Highest education level: Not on file  Occupational History   Not on file  Tobacco Use   Smoking status: Every Day    Current packs/day: 0.25    Average packs/day: 0.3 packs/day for 20.0 years (5.0 ttl pk-yrs)    Types: Cigarettes   Smokeless tobacco: Never  Vaping Use   Vaping status: Never Used  Substance and Sexual Activity   Alcohol use: No    Comment: in remission since 2012   Drug use: Yes    Types: Marijuana    Comment: 08-11-22 last, per pt smokes on average 1/4oz per week   Sexual activity: Yes    Partners: Male    Birth control/protection: Surgical    Comment: Hysterectomy  Other Topics Concern   Not on file  Social History Narrative   Not on file   Social Determinants of Health   Financial Resource Strain: Low Risk  (12/10/2018)   Overall Financial Resource Strain (CARDIA)    Difficulty of Paying Living Expenses: Not hard at all  Food Insecurity: No Food Insecurity (07/07/2023)   Hunger Vital Sign    Worried About Running Out of Food in the Last Year: Never true    Ran Out of Food in the Last Year: Never true  Transportation Needs: No Transportation Needs (07/07/2023)   PRAPARE - Administrator, Civil Service (Medical): No    Lack of Transportation (Non-Medical): No  Physical Activity: Unknown (12/10/2018)   Exercise Vital Sign    Days of Exercise per Week: Patient declined    Minutes of  Exercise per Session: Patient declined  Stress: Not on file  Social Connections: Unknown (12/10/2018)   Social Connection and Isolation Panel [NHANES]    Frequency of Communication with Friends and Family: Patient declined    Frequency of Social Gatherings with Friends and Family: Patient declined    Attends Religious Services: Patient declined    Database administrator or Organizations: Patient declined    Attends Banker Meetings: Patient declined    Marital Status: Patient declined  Intimate Partner Violence: Not At Risk (07/07/2023)   Humiliation, Afraid, Rape, and Kick questionnaire    Fear of  Current or Ex-Partner: No    Emotionally Abused: No    Physically Abused: No    Sexually Abused: No    FAMILY HISTORY: Family History  Problem Relation Age of Onset   Hyperlipidemia Mother    Hypertension Mother    Thyroid disease Mother    Other Mother        Pituitary tumor   Hyperlipidemia Father    Hypertension Father    Migraines Father    Skin cancer Father    Hypertension Brother    Hyperlipidemia Brother    Heart attack Paternal Uncle    Thyroid disease Maternal Grandmother    Uterine cancer Maternal Grandmother 47   Dementia Maternal Grandmother    Thyroid disease Maternal Grandfather    Prostate cancer Maternal Grandfather 1   Dementia Paternal Grandmother    Heart attack Paternal Grandfather    Colon cancer Paternal Great-grandmother     ALLERGIES:  is allergic to demerol and ibuprofen.  MEDICATIONS:  Current Outpatient Medications  Medication Sig Dispense Refill   albuterol (VENTOLIN HFA) 108 (90 Base) MCG/ACT inhaler Inhale 2 puffs into the lungs every 6 (six) hours as needed for wheezing or shortness of breath. 1 each 3   aspirin EC 81 MG tablet Take 81 mg by mouth daily. Swallow whole.     DULoxetine (CYMBALTA) 20 MG capsule TAKE 2 CAPSULES BY MOUTH DAILY 60 capsule 0   gabapentin (NEURONTIN) 300 MG capsule Take 2 capsules (600 mg total) by mouth  3 (three) times daily. 540 capsule 1   indapamide (LOZOL) 1.25 MG tablet Take 1 tablet (1.25 mg total) by mouth daily. (Patient not taking: Reported on 09/16/2023) 90 tablet 0   lamoTRIgine (LAMICTAL) 200 MG tablet TAKE 2 TABLETS BY MOUTH DAILY 180 tablet 0   meclizine (ANTIVERT) 12.5 MG tablet TAKE ONE TABLET BY MOUTH TWICE A DAY AS NEEDED FOR DIZZINESS (Patient not taking: Reported on 10/07/2023) 20 tablet 0   metoprolol tartrate (LOPRESSOR) 25 MG tablet Take 0.5 tablets (12.5 mg total) by mouth daily. Please keep scheduled appointment for future refills. Thank you. (Patient not taking: Reported on 10/07/2023) 90 tablet 3   olmesartan (BENICAR) 20 MG tablet Take 1 tablet (20 mg total) by mouth daily. (Patient not taking: Reported on 09/16/2023) 90 tablet 0   rosuvastatin (CRESTOR) 20 MG tablet TAKE 1 TABLET BY MOUTH DAILY 90 tablet 0   tiZANidine (ZANAFLEX) 4 MG tablet TAKE 1 TABLET BY MOUTH AT BEDTIME 30 tablet 0   traMADol (ULTRAM) 50 MG tablet TAKE 2 TABLETS BY MOUTH TWICE A DAY 120 tablet 0   warfarin (COUMADIN) 7.5 MG tablet TAKE 1 TO 1 AND 1/2 TABLETS BY MOUTH DAILY AS DIRECTED BY ANTICOAGULATION CLINIC **KEEP UPCOMING APPOINTMENT FOR REFILLS** 40 tablet 0   No current facility-administered medications for this visit.    PHYSICAL EXAMINATION: ECOG PERFORMANCE STATUS: 0 - Asymptomatic  Vitals:   10/07/23 1414 10/07/23 1427  BP: (!) 160/93 124/78  Pulse: 85   Resp: 17   Temp: 98.4 F (36.9 C)   SpO2: 99%    Filed Weights   10/07/23 1414  Weight: 109 lb 12.8 oz (49.8 kg)    GENERAL:alert, no distress and comfortable SKIN: skin color, texture, turgor are normal, no rashes or significant lesions EYES: normal, conjunctiva are pink and non-injected, sclera clear OROPHARYNX:no exudate, no erythema and lips, buccal mucosa, and tongue normal  NECK: supple, thyroid normal size, non-tender, without nodularity LYMPH:  no palpable lymphadenopathy in the cervical,  axillary or  inguinal LUNGS: clear to auscultation and percussion with normal breathing effort HEART: regular rate & rhythm and no murmurs and no lower extremity edema ABDOMEN:abdomen soft, non-tender and normal bowel sounds Musculoskeletal:no cyanosis of digits and no clubbing  PSYCH: alert & oriented x 3 with fluent speech NEURO: no focal motor/sensory deficits  LABORATORY DATA:  I have reviewed the data as listed    Latest Ref Rng & Units 10/07/2023    4:00 PM 09/09/2023   11:26 AM 07/15/2023   12:19 AM  CBC  WBC 4.0 - 10.5 K/uL 4.9  5.6  4.6   Hemoglobin 12.0 - 15.0 g/dL 16.1  09.6  04.5   Hematocrit 36.0 - 46.0 % 40.8  49.9  44.5   Platelets 150 - 400 K/uL 126  137.0  84        Latest Ref Rng & Units 09/09/2023   11:26 AM 07/14/2023    3:18 AM 07/13/2023    3:22 AM  CMP  Glucose 70 - 99 mg/dL  409  811   BUN 6 - 20 mg/dL  <5  5   Creatinine 9.14 - 1.00 mg/dL  7.82  9.56   Sodium 213 - 145 mmol/L  139  138   Potassium 3.5 - 5.1 mmol/L  4.2  3.7   Chloride 98 - 111 mmol/L  100  102   CO2 22 - 32 mmol/L  28  29   Calcium 8.9 - 10.3 mg/dL  9.0  8.6   Total Protein 6.0 - 8.3 g/dL 7.3  6.3  5.8   Total Bilirubin 0.2 - 1.2 mg/dL 0.5  0.5  0.7   Alkaline Phos 39 - 117 U/L 110  80  69   AST 0 - 37 U/L 23  49  51   ALT 0 - 35 U/L 16  52  46      All questions were answered. The patient knows to call the clinic with any problems, questions or concerns. The total time spent in the appointment was 40 minutes.     Carlean Jews, NP 10/07/2023   Addendum I have seen the patient, examined her. I agree with the assessment and and plan and have edited the notes.   42 yo female with PMH of of aortic valve stenosis with aortic valve replacement on coumadin, attention deficit, hypertension, arthritis, asthma, and cervical dysplasia, was referred for thrombocytopenia and erythrocytosis.  She is not a smoker, no history of pulmonary disease.  We discussed common etiology of erythrocytosis (PV  and secondary polycythemia), and thrombocytopenia, such as ITP, chronic hepatitis, liver disease, splenomegaly, medication induced, B12 or folate deficiency, and primary pulmonary disease such as MDS.  Given her chronic nature of her thrombocytopenia, this is likely ITP.  We discussed the workup, will check immature platelet count, B12, folate, erythropoietin level, and MPN panel.  She was previously tested negative for HIV and hepatitis C.  Will call her with lab results.  She is on Coumadin, risk of thrombosis from erythrocytosis is low.  We discussed the risks of bleeding from thrombocytopenia since that she is on Coumadin, and recommend close monitoring.  She may require treatment if she has platelet persistently less than 50K.   All questions were answered.  I spent a total of 30 minutes for her visit today, more than 50% time on face-to-face counseling.   Malachy Mood,  MD 10/07/2023

## 2023-10-07 NOTE — Patient Instructions (Addendum)
Description   *PLEASE PUT ON APPT NO ONSITE INTERPRETER NEEDED*   Since you took 2 tablets on yesterday then take another 2 tablets today then continue taking the dose you have been taking, which is, warfarin 1.5 tablets daily except for 1 tablet on Monday, Wednesdays, and Fridays.  Stay consistent with greens each week (2 per week)  Recheck INR in 1 week.   Coumadin Clinic [phone]

## 2023-10-09 ENCOUNTER — Other Ambulatory Visit: Payer: Self-pay | Admitting: Cardiology

## 2023-10-09 DIAGNOSIS — Q2381 Bicuspid aortic valve: Secondary | ICD-10-CM

## 2023-10-09 DIAGNOSIS — Z7901 Long term (current) use of anticoagulants: Secondary | ICD-10-CM

## 2023-10-09 DIAGNOSIS — Z954 Presence of other heart-valve replacement: Secondary | ICD-10-CM

## 2023-10-09 LAB — ERYTHROPOIETIN: Erythropoietin: 13.1 m[IU]/mL (ref 2.6–18.5)

## 2023-10-10 NOTE — Assessment & Plan Note (Signed)
We discussed common etiology of erythrocytosis (PV and secondary polycythemia), and thrombocytopenia, such as ITP, chronic hepatitis, liver disease, splenomegaly, medication induced, B12 or folate deficiency, and primary pulmonary disease such as MDS.   Given her chronic nature of her thrombocytopenia, this is likely ITP.   We will check immature platelet count, B12, folate, erythropoietin level, and MPN panel.  She was previously tested negative for HIV and hepatitis C.  Will call her with lab results.   She is on Coumadin, risk of thrombosis from erythrocytosis is low.  We discussed the risks of bleeding from thrombocytopenia since that she is on Coumadin, and recommend close monitoring.  She is followed by cardiology for INR monitoring and management of coumadin.   She may require treatment if she has platelet persistently less than 50K.

## 2023-10-13 ENCOUNTER — Other Ambulatory Visit: Payer: Self-pay | Admitting: Family Medicine

## 2023-10-13 LAB — JAK2 (INCLUDING V617F AND EXON 12), MPL,& CALR W/RFL MPN PANEL (NGS)

## 2023-10-14 ENCOUNTER — Inpatient Hospital Stay (HOSPITAL_BASED_OUTPATIENT_CLINIC_OR_DEPARTMENT_OTHER): Payer: Managed Care, Other (non HMO) | Admitting: Gynecologic Oncology

## 2023-10-14 ENCOUNTER — Encounter: Payer: Self-pay | Admitting: Gynecologic Oncology

## 2023-10-14 VITALS — BP 141/89 | HR 66 | Temp 97.8°F | Resp 20 | Wt 109.6 lb

## 2023-10-14 DIAGNOSIS — R87811 Vaginal high risk human papillomavirus (HPV) DNA test positive: Secondary | ICD-10-CM

## 2023-10-14 DIAGNOSIS — R87620 Atypical squamous cells of undetermined significance on cytologic smear of vagina (ASC-US): Secondary | ICD-10-CM

## 2023-10-14 DIAGNOSIS — D071 Carcinoma in situ of vulva: Secondary | ICD-10-CM | POA: Diagnosis not present

## 2023-10-14 NOTE — Progress Notes (Signed)
Gynecologic Oncology Return Clinic Visit  10/14/23  Reason for Visit: colposcopy  Treatment History: Dysplasia history:  03/19/22: Vaginal cuff biopsy - VAIN1; left buttocks biopsy - VIN3, Perineal biopsy - VIN 2-3, mons biopsy - VIN 2-3. 01/2022 Vaginal pap - negative, HR HPV positive 12/2018: Pap - negative, HR HPV positive (negative for 16, 18 and 45) 05/2017: vaginal biopsies - one with benign squamous mucosa, other with VAIN I 04/2017: Vaginal pap - negative, HR HPV not detected. 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 Vaginal biopsies, one with VAIN 2, other VAIN 1. 10/2015: vaginal cuff biopsy: VAIN II.  Vulvar biopsies performed including mons and right labia, both showing condyloma. 08/2015: Vaginal pap - LSIL, HR HPV positive. 02/2014: Vaginal cuff biopsy showed condylomata acuminata, p16 negative for diffuse positive staining.  No malignancy or high-grade dysplasia.  Right labial biopsy shows condyloma. 01/2014: Vaginal pap - LSIL.  Reactive squamous cells present. HR HPV positive. 01/2009: Pathology from hysterectomy shows chronic mild inflammation of the cervix and endocervix, mesonephric hyperplasia.  No evidence of malignancy.  Secretory type endometrium. 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  Endo and ectocervical margins negative for mucosa but partially denuded.  Stromal invasion noted. 10/2008: Cervical biopsy at 6:00 shows HPV effect consistent with low-grade dysplasia.  ECC with 1 fragment of ectocervical/squamous metaplasia with low-grade intraepithelial lesion and focal changes consistent with CIN-2. 07/2020: ECC shows benign endocervix, no dysplasia. 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  Cervical biopsy at 1:00 showed squamous mucosa with koilocytic atypia, no dysplasia identified.  ECC shows rare detached fragments of slightly  dysplastic squamous mucosa with benign endocervical mucosa.   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   07/23/22: Vulvar biopsy given symptoms at prior healing incision shows VIN3.    08/12/22: Partial simple anterior left vulvectomy. Pathology: VIN3, margins negative. Right mons biopsy and posterior fourchette biopsy negative for dysplasia or malignancy.  01/07/23: Left peri-anal biopsy shows reactive squamous mucosa, no dysplasia.  08/26/23: Pap - ASCUS, HR HPV + (16/18/45 negative)  Interval History: Doing well, no significant changes from her recent visit.  Past Medical/Surgical History: Past Medical History:  Diagnosis Date   ADD (attention deficit disorder)    Alcohol abuse, in remission 2012   per pt in remission since 2012   Anticoagulated on Coumadin    coumadin--- managed by cardiology/ coumadin clinic   Arthritis    In hips   Asthma    allergy induced, rare inhaler use   Auditory processing disorder    per pt very HOH, first language ASL   Bipolar disorder (HCC)    Chronic left hip pain    Chronic vertigo    Complication of anesthesia    per pt due to auditory processing disorder pt is unable to hear when waking up and also gets very anxious   Family history of uterine cancer    GAD (generalized anxiety disorder)    GERD (gastroesophageal reflux disease)    History of cervical dysplasia    History of condyloma acuminatum 2017   s/p laser ablation vulva   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   biopsy and CO2 laser ablation   HOH (hard of hearing)    per pt very HOH due to auditroy processing disorder   Hyperlipidemia    Hypertension  Muscle spasms of both lower extremities    both hips   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   23 mm Sorin [employer] [place] bileaflet mechanical valve via right anterior mini thoracotomy   Vulvar dysplasia     Past  Surgical History:  Procedure Laterality Date   ANTERIOR CRUCIATE LIGAMENT REPAIR  1993   AORTIC VALVE REPLACEMENT N/A 11/03/2017   Procedure: MINIMALLY INVASIVE AORTIC VALVE REPLACEMENT( MINI THORACOTOMY);  Surgeon: Purcell Nails, MD;  Location: [facility] OR;  Service: Open Heart Surgery;  Laterality: N/A;   CERVICAL BIOPSY  W/ LOOP ELECTRODE EXCISION  2010   CIN III w/extension to glands   CHOLECYSTECTOMY N/A 07/11/2023   Procedure: LAPAROSCOPIC CHOLECYSTECTOMY WITH POSSIBLE INTRAOPERATIVE CHOLANGIOGRAM;  Surgeon: Berna Bue, MD;  Location: [facility] OR;  Service: General;  Laterality: N/A;   CLOSED REDUCTION HIP DISLOCATION Left 1981   CO2 LASER APPLICATION N/A 05/12/2022   Procedure: CO2 LASER APPLICATION TO VULVA;  Surgeon: Carver Fila, MD;  Location: [facility];  Service: Gynecology;  Laterality: N/A;   COLPOSCOPY  10/30/2008   CIN I & II   COLPOSCOPY  07/31/2000   Neg. ECC   COLPOSCOPY  06/30/2001   CIN I   COLPOSCOPY  08/30/2004   ECC--atypia   COLPOSCOPY N/A 01/21/2016   Procedure: COLPOSCOPY with vaginal biopsy with CO 2 Laser of Vaginal and vulvar condyloma;  Surgeon: Patton Salles, MD;  Location: [facility] ORS;  Service: Gynecology;  Laterality: N/A;  [name] will be here 2/21 for 1115 case confirmed 01/16/15 - TS   HARDWARE REMOVAL Left 1992   Left hip   INGUINAL HERNIA REPAIR Bilateral    1981;  [id number]   LEFT AND RIGHT HEART CATHETERIZATION WITH CORONARY ANGIOGRAM N/A 05/18/2014   Procedure: LEFT AND RIGHT HEART CATHETERIZATION WITH CORONARY ANGIOGRAM;  Surgeon: [name] Crafts, MD;  Location: [facility] CATH LAB;  Service: Cardiovascular;  Laterality: N/A;   LESION REMOVAL Right 05/12/2022   Procedure: Sharen Hint OF RIGHT CHEEK CYST;  Surgeon: Allena Napoleon, MD;  Location: [facility];  Service: Plastics;  Laterality: Right;   LYMPH NODE BIOPSY  1995   NASAL SEPTUM SURGERY  2002   ORIF HIP FRACTURE Left 1990   per pt and  took out growth plate  in Right knee   ROBOTIC ASSISTED TOTAL HYSTERECTOMY  02/26/2009   @[facility]    TEE WITHOUT CARDIOVERSION N/A 11/03/2017   Procedure: TRANSESOPHAGEAL ECHOCARDIOGRAM (TEE);  Surgeon: Purcell Nails, MD;  Location: [facility] OR;  Service: Open Heart Surgery;  Laterality: N/A;   TONSILLECTOMY AND ADENOIDECTOMY  1990   TYMPANOSTOMY TUBE PLACEMENT Bilateral    x3  per pt last one 1982   VULVA /PERINEUM BIOPSY N/A 05/12/2022   Procedure: POSSIBLE VULVAR BIOPSY; POSSIBLE VAGINAL BIOPSY;  Surgeon: Carver Fila, MD;  Location: [facility];  Service: Gynecology;  Laterality: N/A;   VULVECTOMY N/A 05/12/2022   Procedure: WIDE EXCISION VULVECTOMY;  Surgeon: Carver Fila, MD;  Location: [facility];  Service: Gynecology;  Laterality: N/A;   VULVECTOMY N/A 08/12/2022   Procedure: WIDE LOCAL EXCISION VULVECTOMY;  Surgeon: Carver Fila, MD;  Location: [facility];  Service: Gynecology;  Laterality: N/A;    Family History  Problem Relation Age of Onset   Hyperlipidemia Mother    Hypertension Mother    Thyroid disease Mother    Other Mother  Pituitary tumor   Hyperlipidemia Father    Hypertension Father    Migraines Father    Skin cancer Father    Hypertension Brother    Hyperlipidemia Brother    Heart attack Paternal Uncle    Thyroid disease Maternal Grandmother    Uterine cancer Maternal Grandmother 72   Dementia Maternal Grandmother    Thyroid disease Maternal Grandfather    Prostate cancer Maternal Grandfather 43   Dementia Paternal Grandmother    Heart attack Paternal Grandfather    Colon cancer Paternal Great-grandmother     Social History   Socioeconomic History   Marital status: Single    Spouse name: Not on file   Number of children: Not on file   Years of education: Not on file   Highest education level: Not on file  Occupational History   Not on file  Tobacco Use   Smoking status: Every Day    Current packs/day: 0.25     Average packs/day: 0.3 packs/day for 20.0 years (5.0 ttl pk-yrs)    Types: Cigarettes   Smokeless tobacco: Never  Vaping Use   Vaping status: Never Used  Substance and Sexual Activity   Alcohol use: No    Comment: in remission since 2012   Drug use: Yes    Types: Marijuana    Comment: 08-11-22 last, per pt smokes on average 1/4oz per week   Sexual activity: Yes    Partners: Male    Birth control/protection: Surgical    Comment: Hysterectomy  Other Topics Concern   Not on file  Social History Narrative   Not on file   Social Determinants of Health   Financial Resource Strain: Low Risk  (12/10/2018)   Overall Financial Resource Strain (CARDIA)    Difficulty of Paying Living Expenses: Not hard at all  Food Insecurity: No Food Insecurity (07/07/2023)   Hunger Vital Sign    Worried About Running Out of Food in the Last Year: Never true    Ran Out of Food in the Last Year: Never true  Transportation Needs: No Transportation Needs (07/07/2023)   PRAPARE - Administrator, Civil Service (Medical): No    Lack of Transportation (Non-Medical): No  Physical Activity: Unknown (12/10/2018)   Exercise Vital Sign    Days of Exercise per Week: Patient declined    Minutes of Exercise per Session: Patient declined  Stress: Not on file  Social Connections: Unknown (12/10/2018)   Social Connection and Isolation Panel [NHANES]    Frequency of Communication with Friends and Family: Patient declined    Frequency of Social Gatherings with Friends and Family: Patient declined    Attends Religious Services: Patient declined    Database administrator or Organizations: Patient declined    Attends Engineer, structural: Patient declined    Marital Status: Patient declined    Current Medications:  Current Outpatient Medications:    albuterol (VENTOLIN HFA) 108 (90 Base) MCG/ACT inhaler, Inhale 2 puffs into the lungs every 6 (six) hours as needed for wheezing or shortness of breath.,  Disp: 1 each, Rfl: 3   aspirin EC 81 MG tablet, Take 81 mg by mouth daily. Swallow whole., Disp: , Rfl:    DULoxetine (CYMBALTA) 20 MG capsule, TAKE 2 CAPSULES BY MOUTH DAILY, Disp: 60 capsule, Rfl: 0   gabapentin (NEURONTIN) 300 MG capsule, Take 2 capsules (600 mg total) by mouth 3 (three) times daily., Disp: 540 capsule, Rfl: 1   indapamide (LOZOL) 1.25 MG tablet,  Take 1 tablet (1.25 mg total) by mouth daily. (Patient not taking: Reported on 09/16/2023), Disp: 90 tablet, Rfl: 0   lamoTRIgine (LAMICTAL) 200 MG tablet, TAKE 2 TABLETS BY MOUTH DAILY, Disp: 180 tablet, Rfl: 0   meclizine (ANTIVERT) 12.5 MG tablet, TAKE ONE TABLET BY MOUTH TWICE A DAY AS NEEDED FOR DIZZINESS (Patient not taking: Reported on 10/07/2023), Disp: 20 tablet, Rfl: 0   metoprolol tartrate (LOPRESSOR) 25 MG tablet, Take 0.5 tablets (12.5 mg total) by mouth daily. Please keep scheduled appointment for future refills. Thank you. (Patient not taking: Reported on 10/07/2023), Disp: 90 tablet, Rfl: 3   olmesartan (BENICAR) 20 MG tablet, Take 1 tablet (20 mg total) by mouth daily. (Patient not taking: Reported on 09/16/2023), Disp: 90 tablet, Rfl: 0   rosuvastatin (CRESTOR) 20 MG tablet, TAKE 1 TABLET BY MOUTH DAILY, Disp: 90 tablet, Rfl: 0   tiZANidine (ZANAFLEX) 4 MG tablet, TAKE 1 TABLET BY MOUTH AT BEDTIME, Disp: 30 tablet, Rfl: 0   traMADol (ULTRAM) 50 MG tablet, TAKE 2 TABLETS BY MOUTH TWICE A DAY, Disp: 120 tablet, Rfl: 0   warfarin (COUMADIN) 7.5 MG tablet, TAKE 1 TO 1 AND 1/2 TABLETS BY MOUTH DAILY AS DIRECTED BY ANTICOAGULATION CLINIC **KEEP UPCOMING APPOINTMENT FOR REFILLS**, Disp: 40 tablet, Rfl: 0  Review of Systems: Denies appetite changes, fevers, chills, fatigue, unexplained weight changes. Denies hearing loss, neck lumps or masses, mouth sores, ringing in ears or voice changes. Denies cough or wheezing.  Denies shortness of breath. Denies chest pain or palpitations. Denies leg swelling. Denies abdominal distention,  pain, blood in stools, constipation, diarrhea, nausea, vomiting, or early satiety. Denies pain with intercourse, dysuria, frequency, hematuria or incontinence. Denies hot flashes, pelvic pain, vaginal bleeding or vaginal discharge.   Denies joint pain, back pain or muscle pain/cramps. Denies itching, rash, or wounds. Denies dizziness, headaches, numbness or seizures. Denies swollen lymph nodes or glands, denies easy bruising or bleeding. Denies anxiety, depression, confusion, or decreased concentration.  Physical Exam: BP (!) 141/89 (Patient Position: Sitting)   Pulse 66   Temp 97.8 F (36.6 C) (Oral)   Resp 20   Wt 109 lb 9.6 oz (49.7 kg)   SpO2 100%   BMI 21.40 kg/m  General: Alert, oriented, no acute distress. HEENT: Normocephalic, atraumatic, sclera anicteric. Chest: Unlabored breathing on room air. GU: Pigmentation of a 4 x 5 cm area along the right mons, unchanged.  No areas of leukoplakia or desquamation posteriorly.  The skin along the perineum is somewhat thinned but otherwise normal in appearance.  Hemorrhoids noted.  No other vulvar areas concerning for dysplasia.  Several hypopigmented lesions consistent with prior laser therapy.  On speculum exam, vaginal mucosa is well-rugated, no visible lesions.  Colposcopy with vaginal biopsy Preoperative diagnosis: ASCUS Pap, high risk HPV positive Postoperative diagnosis: Same as above Provider: Pricilla Holm MD Procedure: Colposcopy with vaginal biopsy Specimen: Vaginal cuff biopsy, just to the right of midline Estimated blood loss: Minimal Seizure: After the procedure was discussed with the patient, including risks and benefits as well as alternatives, she signed informed consent.  She was placed in dorsolithotomy position and speculum was placed in the vagina.  5% acetic acid was applied to the vaginal mucosa and colposcopy performed.  No areas of acetowhite noted.  Lugol's was then applied and there was an approximately 5 mm area of  decreased uptake along the midportion of the cuff just to the right of midline.  This area was cleansed with Betadine x 3.  Tischler forceps were used to biopsy this site.  Biopsy was placed in formalin.  Silver nitrate was used to achieve hemostasis.  All instruments were removed from the vagina.  Overall the patient tolerated the procedure well.  Laboratory & Radiologic Studies: None new  Assessment & Plan: Danielle Obrien is a 42 y.o. 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  Developed symptoms at the site of her wide local excision with biopsy proven VIN3 resected in 07/2022 with negative margins.  Recent Pap ASCUS, high risk HPV positive.  Colposcopy performed today with findings most consistent with low-grade dysplasia.  Vaginal biopsy performed.  I will contact patient with biopsy results.  If a biopsy reveals low-grade dysplasia or less, plan for continued close follow-up.  We we will tentatively plan on follow-up visit in 6 months.  The patient will call with any change to symptoms or new vulvar lesions.  22 minutes of total time was spent for this patient encounter, including preparation, face-to-face counseling with the patient and coordination of care, and documentation of the encounter.  Eugene Garnet, MD  Division of Gynecologic Oncology  Department of Obstetrics and Gynecology  [facility]

## 2023-10-14 NOTE — Patient Instructions (Signed)
It was good to see you today.  I will call you with your biopsy results when back next week.  I will tentatively plan to see you for follow-up in 6 months.

## 2023-10-15 ENCOUNTER — Ambulatory Visit: Payer: Managed Care, Other (non HMO) | Attending: Internal Medicine

## 2023-10-15 DIAGNOSIS — Q2381 Bicuspid aortic valve: Secondary | ICD-10-CM

## 2023-10-15 DIAGNOSIS — Q230 Congenital stenosis of aortic valve: Secondary | ICD-10-CM | POA: Diagnosis not present

## 2023-10-15 DIAGNOSIS — Z954 Presence of other heart-valve replacement: Secondary | ICD-10-CM

## 2023-10-15 DIAGNOSIS — I350 Nonrheumatic aortic (valve) stenosis: Secondary | ICD-10-CM

## 2023-10-15 DIAGNOSIS — Z7901 Long term (current) use of anticoagulants: Secondary | ICD-10-CM

## 2023-10-15 LAB — POCT INR: INR: 3.3 — AB (ref 2.0–3.0)

## 2023-10-15 NOTE — Patient Instructions (Signed)
*  PLEASE PUT ON APPT NO ONSITE INTERPRETER NEEDED*  Continue 1.5 tablets daily except for 1 tablet on Monday, Wednesdays, and Fridays.  Stay consistent with greens each week (2 per week)  Recheck INR in 4 weeks.   Coumadin Clinic [phone]

## 2023-10-15 NOTE — Progress Notes (Signed)
Appointment on 10/26/2023 to discuss lab results. All look good. Will monitor.

## 2023-10-18 LAB — SURGICAL PATHOLOGY

## 2023-10-19 ENCOUNTER — Telehealth: Payer: Self-pay | Admitting: *Deleted

## 2023-10-19 NOTE — Telephone Encounter (Signed)
-----   Message from Carver Fila sent at 10/19/2023 11:18 AM EST ----- Jacinto Reap, Could you please let her know biopsy shows low grade precancer? This is something we will just keep a close eye on. Thank you, Georgiann Hahn

## 2023-10-19 NOTE — Telephone Encounter (Signed)
Spoke with Ms. Morrison and relayed message from Dr. Pricilla Holm that her biopsy shows low grade precancer. This is something we will just keep a close eye on. Pt verbalized understanding and thanked the office for calling.

## 2023-10-26 ENCOUNTER — Inpatient Hospital Stay: Payer: Managed Care, Other (non HMO) | Admitting: Nurse Practitioner

## 2023-10-26 ENCOUNTER — Inpatient Hospital Stay: Payer: Managed Care, Other (non HMO)

## 2023-10-26 ENCOUNTER — Other Ambulatory Visit: Payer: Self-pay | Admitting: Nurse Practitioner

## 2023-10-26 DIAGNOSIS — D696 Thrombocytopenia, unspecified: Secondary | ICD-10-CM

## 2023-10-26 DIAGNOSIS — D649 Anemia, unspecified: Secondary | ICD-10-CM

## 2023-10-26 NOTE — Progress Notes (Deleted)
Patient Care Team: Etta Grandchild, MD as PCP - General (Internal Medicine) Quintella Reichert, MD as PCP - Cardiology (Cardiology) Corwin Levins, MD as Consulting Physician (Internal Medicine) Patton Salles, MD as Consulting Physician (Obstetrics and Gynecology) [name], [facility] (Pharmacist)  Clinic Day:  10/26/2023  Referring physician: Etta Grandchild, MD  ASSESSMENT & PLAN:   Assessment & Plan: Thrombocytopenia (HCC) We discussed common etiology of erythrocytosis (PV and secondary polycythemia), and thrombocytopenia, such as ITP, chronic hepatitis, liver disease, splenomegaly, medication induced, B12 deficiency, folate deficiency, and primary pulmonary disease.  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   -lab check today includes immature platelet count, B12, folate, erythropoietin level, and MPN panel.  -prior labs were negative for HIV and hepatitis C.  Will call her with lab results.   -She is on Coumadin, so risk of thrombosis from erythrocytosis is low.  We discussed the risks of bleeding from thrombocytopenia since she is on Coumadin.She is followed by cardiology for INR monitoring and management of coumadin.  -if her platelet count is persistently less than 50K, she may require treatment such as platelet transfusion in the future.   Testing done at initial visit showed normal CBC with WBC 4.9; Hgb 14.3; Hct 40.8; MCV 91.8; PLT 126; and 2.3. the immature platelet was normal at 7.7. 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 Testing for other genetic abnormalities was negative. B12 level was low/normal at 315, erythropoietin was 13.1, and folate was normal at 6.5.     The patient understands the plans discussed today and is in agreement with them.  She knows to contact our office if she develops concerns prior to her next appointment.  I provided *** minutes of  face-to-face time during this encounter and > 50% was spent counseling as documented under my assessment and plan.    Carlean Jews, NP  [facility] - A DEPT OF [facility] [address] [place] [address] Dept: [phone] Dept Fax: [phone]   No orders of the defined types were placed in this encounter.     CHIEF COMPLAINT:  CC: thrombocytopenia  Current Treatment:  surveillance  INTERVAL HISTORY:  Danielle Obrien is here today for repeat clinical assessment. She was last seen by myself and Dr. Mosetta Putt on 10/07/2023. Testing done at initial visit showed normal CBC with WBC 4.9; Hgb 14.3; Hct 40.8; MCV 91.8; PLT 126; and 2.3. the immature platelet was normal at 7.7. 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 Testing for other genetic abnormalities was negative. B12 level was low/normal at 315, erythropoietin was 13.1, and folate was normal at 6.5. denies fevers or chills. She denies pain. Her appetite is good. Her weight {Weight change:10426}.  I have reviewed the past medical history, past surgical history, social history and family history with the patient and they are unchanged from previous note.  ALLERGIES:  is allergic to demerol and ibuprofen.  MEDICATIONS:  Current Outpatient Medications  Medication Sig Dispense Refill   albuterol (VENTOLIN HFA) 108 (90 Base) MCG/ACT inhaler Inhale 2 puffs into the lungs every 6 (six) hours as needed for wheezing or shortness of breath. 1 each 3   aspirin EC 81 MG tablet Take 81 mg by mouth daily. Swallow whole.     DULoxetine (CYMBALTA) 20 MG capsule TAKE 2  CAPSULES BY MOUTH DAILY 60 capsule 0   gabapentin (NEURONTIN) 300 MG capsule Take 2 capsules (600 mg total) by mouth 3 (three) times daily. 540 capsule 1   indapamide (LOZOL) 1.25 MG tablet Take 1 tablet (1.25 mg total) by mouth daily. (Patient not taking: Reported on  09/16/2023) 90 tablet 0   lamoTRIgine (LAMICTAL) 200 MG tablet TAKE 2 TABLETS BY MOUTH DAILY 180 tablet 0   meclizine (ANTIVERT) 12.5 MG tablet TAKE ONE TABLET BY MOUTH TWICE A DAY AS NEEDED FOR DIZZINESS (Patient not taking: Reported on 10/07/2023) 20 tablet 0   metoprolol tartrate (LOPRESSOR) 25 MG tablet Take 0.5 tablets (12.5 mg total) by mouth daily. Please keep scheduled appointment for future refills. Thank you. (Patient not taking: Reported on 10/07/2023) 90 tablet 3   olmesartan (BENICAR) 20 MG tablet Take 1 tablet (20 mg total) by mouth daily. (Patient not taking: Reported on 09/16/2023) 90 tablet 0   rosuvastatin (CRESTOR) 20 MG tablet TAKE 1 TABLET BY MOUTH DAILY 90 tablet 0   tiZANidine (ZANAFLEX) 4 MG tablet TAKE 1 TABLET BY MOUTH AT BEDTIME 30 tablet 0   traMADol (ULTRAM) 50 MG tablet TAKE 2 TABLETS BY MOUTH TWICE A DAY 120 tablet 0   warfarin (COUMADIN) 7.5 MG tablet TAKE 1 TO 1 AND 1/2 TABLETS BY MOUTH DAILY AS DIRECTED BY ANTICOAGULATION CLINIC **KEEP UPCOMING APPOINTMENT FOR REFILLS** 40 tablet 0   No current facility-administered medications for this visit.    HISTORY OF PRESENT ILLNESS:   Oncology History   No history exists.      REVIEW OF SYSTEMS:   Constitutional: Denies fevers, chills or abnormal weight loss Eyes: Denies blurriness of vision Ears, nose, mouth, throat, and face: Denies mucositis or sore throat Respiratory: Denies cough, dyspnea or wheezes Cardiovascular: Denies palpitation, chest discomfort or lower extremity swelling Gastrointestinal:  Denies nausea, heartburn or change in bowel habits Skin: Denies abnormal skin rashes Lymphatics: Denies new lymphadenopathy or easy bruising Neurological:Denies numbness, tingling or new weaknesses Behavioral/Psych: Mood is stable, no new changes  All other systems were reviewed with the patient and are negative.   VITALS:  There were no vitals taken for this visit.  Wt Readings from Last 3 Encounters:   10/14/23 109 lb 9.6 oz (49.7 kg)  10/07/23 109 lb 12.8 oz (49.8 kg)  09/09/23 111 lb (50.3 kg)    There is no height or weight on file to calculate BMI.  Performance status (ECOG): {CHL ONC [id number]  PHYSICAL EXAM:   GENERAL:alert, no distress and comfortable SKIN: skin color, texture, turgor are normal, no rashes or significant lesions EYES: normal, Conjunctiva are pink and non-injected, sclera clear OROPHARYNX:no exudate, no erythema and lips, buccal mucosa, and tongue normal  NECK: supple, thyroid normal size, non-tender, without nodularity LYMPH:  no palpable lymphadenopathy in the cervical, axillary or inguinal LUNGS: clear to auscultation and percussion with normal breathing effort HEART: regular rate & rhythm and no murmurs and no lower extremity edema ABDOMEN:abdomen soft, non-tender and normal bowel sounds Musculoskeletal:no cyanosis of digits and no clubbing  NEURO: alert & oriented x 3 with fluent speech, no focal motor/sensory deficits  LABORATORY DATA:  I have reviewed the data as listed    Component Value Date/Time   NA 139 07/14/2023 0318   NA 144 11/24/2017 1218   K 4.2 07/14/2023 0318   CL 100 07/14/2023 0318   CO2 28 07/14/2023 0318   GLUCOSE 102 (H) 07/14/2023 0318   BUN <5 (L) 07/14/2023 1610  BUN 15 11/24/2017 1218   CREATININE 0.79 07/14/2023 0318   CALCIUM 9.0 07/14/2023 0318   PROT 7.3 09/09/2023 1126   ALBUMIN 4.8 09/09/2023 1126   AST 23 09/09/2023 1126   ALT 16 09/09/2023 1126   ALKPHOS 110 09/09/2023 1126   BILITOT 0.5 09/09/2023 1126   GFRNONAA >60 07/14/2023 0318   GFRAA >60 12/11/2018 0242    No results found for: "SPEP", "UPEP"  Lab Results  Component Value Date   WBC 4.9 10/07/2023   NEUTROABS 2.3 10/07/2023   HGB 14.3 10/07/2023   HCT 40.8 10/07/2023   MCV 91.5 10/07/2023   PLT 126 (L) 10/07/2023      Chemistry      Component Value Date/Time   NA 139 07/14/2023 0318   NA 144 11/24/2017 1218   K 4.2 07/14/2023  0318   CL 100 07/14/2023 0318   CO2 28 07/14/2023 0318   BUN <5 (L) 07/14/2023 0318   BUN 15 11/24/2017 1218   CREATININE 0.79 07/14/2023 0318      Component Value Date/Time   CALCIUM 9.0 07/14/2023 0318   ALKPHOS 110 09/09/2023 1126   AST 23 09/09/2023 1126   ALT 16 09/09/2023 1126   BILITOT 0.5 09/09/2023 1126       RADIOGRAPHIC STUDIES: I have personally reviewed the radiological images as listed and agreed with the findings in the report. No results found.

## 2023-10-26 NOTE — Assessment & Plan Note (Deleted)
We discussed common etiology of erythrocytosis (PV and secondary polycythemia), and thrombocytopenia, such as ITP, chronic hepatitis, liver disease, splenomegaly, medication induced, B12 deficiency, folate deficiency, and primary pulmonary disease.  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   -lab check today includes immature platelet count, B12, folate, erythropoietin level, and MPN panel.  -prior labs were negative for HIV and hepatitis C.  Will call her with lab results.   -She is on Coumadin, so risk of thrombosis from erythrocytosis is low.  We discussed the risks of bleeding from thrombocytopenia since she is on Coumadin.She is followed by cardiology for INR monitoring and management of coumadin.  -if her platelet count is persistently less than 50K, she may require treatment such as platelet transfusion in the future.   Testing done at initial visit showed normal CBC with WBC 4.9; Hgb 14.3; Hct 40.8; MCV 91.8; PLT 126; and 2.3. the immature platelet was normal at 7.7. 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 Testing for other genetic abnormalities was negative. B12 level was low/normal at 315, erythropoietin was 13.1, and folate was normal at 6.5.

## 2023-11-01 ENCOUNTER — Other Ambulatory Visit: Payer: Self-pay | Admitting: Family Medicine

## 2023-11-01 ENCOUNTER — Telehealth: Payer: Self-pay | Admitting: Hematology

## 2023-11-03 ENCOUNTER — Other Ambulatory Visit: Payer: Self-pay

## 2023-11-03 DIAGNOSIS — D696 Thrombocytopenia, unspecified: Secondary | ICD-10-CM

## 2023-11-03 DIAGNOSIS — D649 Anemia, unspecified: Secondary | ICD-10-CM

## 2023-11-04 ENCOUNTER — Inpatient Hospital Stay (HOSPITAL_BASED_OUTPATIENT_CLINIC_OR_DEPARTMENT_OTHER): Payer: Managed Care, Other (non HMO) | Admitting: Hematology

## 2023-11-04 ENCOUNTER — Inpatient Hospital Stay: Payer: Managed Care, Other (non HMO) | Attending: Gynecologic Oncology

## 2023-11-04 VITALS — BP 134/90 | HR 70 | Temp 97.9°F | Resp 18 | Wt 111.8 lb

## 2023-11-04 DIAGNOSIS — E785 Hyperlipidemia, unspecified: Secondary | ICD-10-CM | POA: Diagnosis not present

## 2023-11-04 DIAGNOSIS — Z90710 Acquired absence of both cervix and uterus: Secondary | ICD-10-CM | POA: Insufficient documentation

## 2023-11-04 DIAGNOSIS — J45909 Unspecified asthma, uncomplicated: Secondary | ICD-10-CM | POA: Insufficient documentation

## 2023-11-04 DIAGNOSIS — Z79899 Other long term (current) drug therapy: Secondary | ICD-10-CM | POA: Insufficient documentation

## 2023-11-04 DIAGNOSIS — F319 Bipolar disorder, unspecified: Secondary | ICD-10-CM | POA: Insufficient documentation

## 2023-11-04 DIAGNOSIS — D693 Immune thrombocytopenic purpura: Secondary | ICD-10-CM | POA: Insufficient documentation

## 2023-11-04 DIAGNOSIS — Z885 Allergy status to narcotic agent status: Secondary | ICD-10-CM | POA: Insufficient documentation

## 2023-11-04 DIAGNOSIS — D649 Anemia, unspecified: Secondary | ICD-10-CM

## 2023-11-04 DIAGNOSIS — Z7901 Long term (current) use of anticoagulants: Secondary | ICD-10-CM | POA: Insufficient documentation

## 2023-11-04 DIAGNOSIS — D696 Thrombocytopenia, unspecified: Secondary | ICD-10-CM

## 2023-11-04 DIAGNOSIS — I10 Essential (primary) hypertension: Secondary | ICD-10-CM | POA: Diagnosis not present

## 2023-11-04 DIAGNOSIS — D751 Secondary polycythemia: Secondary | ICD-10-CM | POA: Diagnosis not present

## 2023-11-04 DIAGNOSIS — Z9049 Acquired absence of other specified parts of digestive tract: Secondary | ICD-10-CM | POA: Insufficient documentation

## 2023-11-04 DIAGNOSIS — Z886 Allergy status to analgesic agent status: Secondary | ICD-10-CM | POA: Insufficient documentation

## 2023-11-04 DIAGNOSIS — D071 Carcinoma in situ of vulva: Secondary | ICD-10-CM | POA: Diagnosis present

## 2023-11-04 LAB — IRON AND IRON BINDING CAPACITY (CC-WL,HP ONLY)
Iron: 115 ug/dL (ref 28–170)
Saturation Ratios: 34 % — ABNORMAL HIGH (ref 10.4–31.8)
TIBC: 340 ug/dL (ref 250–450)
UIBC: 225 ug/dL (ref 148–442)

## 2023-11-04 LAB — CBC (CANCER CENTER ONLY)
HCT: 42.8 % (ref 36.0–46.0)
Hemoglobin: 14.9 g/dL (ref 12.0–15.0)
MCH: 32.2 pg (ref 26.0–34.0)
MCHC: 34.8 g/dL (ref 30.0–36.0)
MCV: 92.4 fL (ref 80.0–100.0)
Platelet Count: 149 10*3/uL — ABNORMAL LOW (ref 150–400)
RBC: 4.63 MIL/uL (ref 3.87–5.11)
RDW: 13.5 % (ref 11.5–15.5)
WBC Count: 5.9 10*3/uL (ref 4.0–10.5)
nRBC: 0 % (ref 0.0–0.2)

## 2023-11-05 LAB — FERRITIN: Ferritin: 28 ng/mL (ref 11–307)

## 2023-11-05 NOTE — Progress Notes (Signed)
[facility]   Telephone:([phone]) [phone] Fax:(336) [phone]   Clinic Follow up Note   Patient Care Team: Etta Grandchild, MD as PCP - General (Internal Medicine) Quintella Reichert, MD as PCP - Cardiology (Cardiology) Corwin Levins, MD as Consulting Physician (Internal Medicine) Ardell Isaacs, Forrestine Him, MD as Consulting Physician (Obstetrics and Gynecology) [name], [facility] (Pharmacist) Malachy Mood, MD as Consulting Physician (Hematology and Oncology) Carlean Jews, NP as Nurse Practitioner (Hematology and Oncology)  Date of Service:  11/05/2023  CHIEF COMPLAINT: f/u of thrombocytopenia   CURRENT THERAPY:  Observation   Assessment and Plan    Immune Thrombocytopenic Purpura (ITP)   -She had had a mild intermittent thrombocytopenia 70-150 for at least 10 years, likely ITP or cardiac valve issue.  -we reviewed that ITP is an autoimmune condition characterized by the production of antibodies against platelets, leading to their premature destruction. The platelet count has been fluctuating but remains within a safe range, never dropping below 70. Current platelet count is 149. The patient reports new onset of subcutaneous hematomas, likely small hematomas. The patient is on warfarin and baby aspirin, increasing the risk of bruising and bleeding. Advised on immediate use of ice after trauma to reduce bleeding and using a heating pad after 2-3 days to help absorb the blood. Explained that the condition is not dangerous and does not currently require treatment. Discussed that if platelet count drops below 70, medical treatment may be necessary.   - Monitor blood counts every four months   - Educate on using ice immediately after trauma and heating pad after 2-3 days   - Provide educational materials on ITP   - Advise to call if increased bruising or bleeding, especially epistaxis    Hx of bicuspid AV with severe AS, s/p minimally invasive AVR with mechanical valve via  right anterior mini thoracotomy on 11/03/2017 by Dr. Cornelius Moras, on coumadin  The patient is on warfarin, f/u with cardiologist Dr. Mayford Knife   Erythrocytosis -She had transient erythrocytosis with hemoglobin 16.5 and hematocrit 49.9% in October 2024 -MPN panel including JAK2 mutation were negative, PV ruled out -Repeated hemoglobin normal  Low Normal Vitamin B12 Levels   B12 levels are on the low end of normal. No indication for B12 injections at this time. Advised to take over-the-counter B12 supplements, 1000 mcg, three times a week. Confirmed that B12 supplementation will not interfere with INR levels.   - Recommend over-the-counter B12 supplements, 1000 mcg, three times a week    Plan -Lab results reviewed - Schedule follow-up blood count check every four months   - Schedule annual follow-up appointment in one year.       Discussed the use of AI scribe software for clinical note transcription with the patient, who gave verbal consent to proceed.  History of Present Illness   The patient, a 42 year old female with a history of ITP and on warfarin therapy, presents with concerns about new bruising and knots that form after minor bumps. The patient reports that these knots have started appearing in the past year or two, and she forms even after very light bumps. The patient also mentions that she has been experiencing fluctuations in her platelet counts, which have been monitored regularly. The patient's mother is also present during the consultation and participates in the discussion about the patient's condition and treatment. The patient is also on warfarin therapy, which she mentions might be contributing to her bruising. The patient also mentions that  she has been trying to increase her intake of greens to reduce bruising. The patient also reports that she has been managing her warfarin therapy well, with regular monitoring and adjustments as needed.         All other systems were reviewed  with the patient and are negative.  MEDICAL HISTORY:  Past Medical History:  Diagnosis Date   ADD (attention deficit disorder)    Alcohol abuse, in remission 2012   per pt in remission since 2012   Anticoagulated on Coumadin    coumadin--- managed by cardiology/ coumadin clinic   Arthritis    In hips   Asthma    allergy induced, rare inhaler use   Auditory processing disorder    per pt very HOH, first language ASL   Bipolar disorder (HCC)    Chronic left hip pain    Chronic vertigo    Complication of anesthesia    per pt due to auditory processing disorder pt is unable to hear when waking up and also gets very anxious   Family history of uterine cancer    GAD (generalized anxiety disorder)    GERD (gastroesophageal reflux disease)    History of cervical dysplasia    History of condyloma acuminatum 2017   s/p laser ablation vulva   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   biopsy and CO2 laser ablation   HOH (hard of hearing)    per pt very HOH due to auditroy processing disorder   Hyperlipidemia    Hypertension    Muscle spasms of both lower extremities    both hips   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   23 mm Sorin [employer] [place] bileaflet mechanical valve via right anterior mini thoracotomy   Vulvar dysplasia     SURGICAL HISTORY: Past Surgical History:  Procedure Laterality Date   ANTERIOR CRUCIATE LIGAMENT REPAIR  1993   AORTIC VALVE REPLACEMENT N/A 11/03/2017   Procedure: MINIMALLY INVASIVE AORTIC VALVE REPLACEMENT( MINI THORACOTOMY);  Surgeon: Purcell Nails, MD;  Location: [facility] OR;  Service: Open Heart Surgery;  Laterality: N/A;   CERVICAL BIOPSY  W/ LOOP ELECTRODE EXCISION  2010   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 ECC   COLPOSCOPY  06/30/2001   CIN I   COLPOSCOPY  08/30/2004   ECC--atypia   COLPOSCOPY N/A 01/21/2016   Procedure: COLPOSCOPY with vaginal biopsy with CO 2 Laser of Vaginal and vulvar condyloma;  Surgeon: Patton Salles, MD;  Location: [facility] ORS;  Service: Gynecology;  Laterality: N/A;  [name] will be here 2/21 for 1115 case confirmed 01/16/15 - TS   HARDWARE REMOVAL Left 1992   Left hip   INGUINAL HERNIA REPAIR Bilateral    1981;  [id number]   LEFT AND RIGHT HEART CATHETERIZATION WITH CORONARY ANGIOGRAM N/A 05/18/2014   Procedure: LEFT AND RIGHT HEART CATHETERIZATION WITH CORONARY ANGIOGRAM;  Surgeon: [name] Crafts, MD;  Location: [facility] CATH LAB;  Service: Cardiovascular;  Laterality: N/A;   LESION REMOVAL Right 05/12/2022   Procedure: Sharen Hint OF RIGHT CHEEK CYST;  Surgeon: Allena Napoleon, MD;  Location: [facility];  Service: Plastics;  Laterality: Right;   LYMPH NODE BIOPSY  1995   NASAL SEPTUM SURGERY  2002   ORIF HIP FRACTURE Left 1990   per pt and  took out growth plate in Right knee   ROBOTIC ASSISTED TOTAL HYSTERECTOMY  02/26/2009   @[facility]    TEE WITHOUT CARDIOVERSION N/A 11/03/2017   Procedure: TRANSESOPHAGEAL ECHOCARDIOGRAM (TEE);  Surgeon: Purcell Nails, MD;  Location: [facility] OR;  Service: Open Heart Surgery;  Laterality: N/A;   TONSILLECTOMY AND ADENOIDECTOMY  1990   TYMPANOSTOMY TUBE PLACEMENT Bilateral    x3  per pt last one 1982   VULVA /PERINEUM BIOPSY N/A 05/12/2022   Procedure: POSSIBLE VULVAR BIOPSY; POSSIBLE VAGINAL BIOPSY;  Surgeon: Carver Fila, MD;  Location: [facility];  Service: Gynecology;   Laterality: N/A;   VULVECTOMY N/A 05/12/2022   Procedure: WIDE EXCISION VULVECTOMY;  Surgeon: Carver Fila, MD;  Location: [facility];  Service: Gynecology;  Laterality: N/A;   VULVECTOMY N/A 08/12/2022   Procedure: WIDE LOCAL EXCISION VULVECTOMY;  Surgeon: Carver Fila, MD;  Location: [facility];  Service: Gynecology;  Laterality: N/A;    I have reviewed the social history and family history with the patient and they are unchanged from previous note.  ALLERGIES:  is allergic to demerol and ibuprofen.  MEDICATIONS:  Current Outpatient Medications  Medication Sig Dispense Refill   albuterol (VENTOLIN HFA) 108 (90 Base) MCG/ACT inhaler Inhale 2 puffs into the lungs every 6 (six) hours as needed for wheezing or shortness of breath. 1 each 3   aspirin EC 81 MG tablet Take 81 mg by mouth daily. Swallow whole.     DULoxetine (CYMBALTA) 20 MG capsule TAKE 2 CAPSULES BY MOUTH DAILY 60 capsule 0   gabapentin (NEURONTIN) 300 MG capsule Take 2 capsules (600 mg total) by mouth 3 (three) times daily. 540 capsule 1   indapamide (LOZOL) 1.25 MG tablet Take 1 tablet (1.25 mg total) by mouth daily. (Patient not taking: Reported on 09/16/2023) 90 tablet 0   lamoTRIgine (LAMICTAL) 200 MG tablet TAKE 2 TABLETS BY MOUTH DAILY 180 tablet 0   meclizine (ANTIVERT) 12.5 MG tablet TAKE ONE TABLET BY MOUTH TWICE A DAY AS NEEDED FOR DIZZINESS (Patient not taking: Reported on 10/07/2023) 20 tablet 0   metoprolol tartrate (LOPRESSOR) 25 MG tablet Take 0.5 tablets (12.5 mg total) by mouth daily. Please keep scheduled appointment for future refills. Thank you. (Patient not taking: Reported on 10/07/2023) 90 tablet 3   olmesartan (BENICAR) 20 MG tablet Take 1 tablet (20 mg total) by mouth daily. (Patient not taking: Reported on 09/16/2023) 90 tablet 0   rosuvastatin (CRESTOR) 20 MG tablet TAKE 1 TABLET BY MOUTH DAILY 90 tablet 0   tiZANidine (ZANAFLEX) 4 MG tablet TAKE 1 TABLET BY  MOUTH AT BEDTIME 30 tablet 0   traMADol (ULTRAM) 50 MG tablet TAKE 2 TABLETS BY MOUTH TWICE A DAY 120 tablet 0   warfarin (COUMADIN) 7.5 MG tablet TAKE 1 TO 1 AND 1/2 TABLETS BY MOUTH DAILY AS DIRECTED BY ANTICOAGULATION CLINIC **KEEP UPCOMING APPOINTMENT FOR REFILLS** 40 tablet 0   No current facility-administered medications for this visit.    PHYSICAL EXAMINATION: ECOG PERFORMANCE STATUS: 0 - Asymptomatic  Vitals:   11/04/23 1514  BP: (!) 134/90  Pulse: 70  Resp: 18  Temp: 97.9 F (36.6 C)  SpO2: 96%   Wt Readings from Last 3 Encounters:  11/04/23 111 lb 12.8 oz (50.7 kg)  10/14/23 109 lb 9.6 oz (49.7 kg)  10/07/23 109 lb 12.8 oz (49.8 kg)     GENERAL:alert, no distress and comfortable SKIN: skin color, texture, turgor are normal, no rashes or significant lesions except for multiple small ecchymosis on her arms with subcutaneous small nodule, likely a small hematoma EYES: normal, Conjunctiva are pink and non-injected, sclera clea  Musculoskeletal:no cyanosis of digits and no clubbing  NEURO: alert & oriented x 3 with fluent speech, no focal motor/sensory deficits   LABORATORY DATA:  I have reviewed the data as listed    Latest Ref Rng & Units 11/04/2023    2:24 PM 10/07/2023    4:00 PM 09/09/2023   11:26 AM  CBC  WBC 4.0 - 10.5 K/uL 5.9  4.9  5.6   Hemoglobin 12.0 - 15.0 g/dL 16.1  09.6  04.5   Hematocrit 36.0 - 46.0 % 42.8  40.8  49.9   Platelets 150 - 400 K/uL 149  126  137.0         Latest Ref Rng & Units 09/09/2023   11:26 AM 07/14/2023    3:18 AM 07/13/2023    3:22 AM  CMP  Glucose 70 - 99 mg/dL  409  811   BUN 6 - 20 mg/dL  <5  5   Creatinine 9.14 - 1.00 mg/dL  7.82  9.56   Sodium 213 - 145 mmol/L  139  138   Potassium 3.5 - 5.1 mmol/L  4.2  3.7   Chloride 98 - 111 mmol/L  100  102   CO2 22 - 32 mmol/L  28  29   Calcium 8.9 - 10.3 mg/dL  9.0  8.6   Total Protein 6.0 - 8.3 g/dL 7.3  6.3  5.8   Total Bilirubin 0.2 - 1.2 mg/dL 0.5  0.5  0.7   Alkaline  Phos 39 - 117 U/L 110  80  69   AST 0 - 37 U/L 23  49  51   ALT 0 - 35 U/L 16  52  46       RADIOGRAPHIC STUDIES: I have personally reviewed the radiological images as listed and agreed with the findings in the report. No results found.    Orders Placed This Encounter  Procedures   Vitamin B12    Standing Status:   Standing    Number of Occurrences:   5    Standing Expiration Date:   11/03/2024   All questions were answered. The patient knows to call the clinic with any problems, questions or concerns. No barriers to learning was detected. The total time spent in the appointment was 25 minutes.     Malachy Mood, MD 11/05/2023

## 2023-11-09 ENCOUNTER — Other Ambulatory Visit: Payer: Self-pay | Admitting: Family Medicine

## 2023-11-09 ENCOUNTER — Other Ambulatory Visit: Payer: Self-pay | Admitting: Internal Medicine

## 2023-11-09 DIAGNOSIS — J209 Acute bronchitis, unspecified: Secondary | ICD-10-CM

## 2023-11-10 ENCOUNTER — Encounter: Payer: Self-pay | Admitting: Internal Medicine

## 2023-11-10 ENCOUNTER — Ambulatory Visit (INDEPENDENT_AMBULATORY_CARE_PROVIDER_SITE_OTHER): Payer: Managed Care, Other (non HMO) | Admitting: Internal Medicine

## 2023-11-10 ENCOUNTER — Ambulatory Visit (INDEPENDENT_AMBULATORY_CARE_PROVIDER_SITE_OTHER): Payer: Managed Care, Other (non HMO)

## 2023-11-10 VITALS — BP 172/98 | HR 69 | Temp 98.3°F | Ht 60.0 in | Wt 113.4 lb

## 2023-11-10 DIAGNOSIS — Z954 Presence of other heart-valve replacement: Secondary | ICD-10-CM

## 2023-11-10 DIAGNOSIS — J209 Acute bronchitis, unspecified: Secondary | ICD-10-CM

## 2023-11-10 DIAGNOSIS — J4530 Mild persistent asthma, uncomplicated: Secondary | ICD-10-CM

## 2023-11-10 MED ORDER — HYDROCODONE BIT-HOMATROP MBR 5-1.5 MG/5ML PO SOLN: 5.0000 mL | Freq: Three times a day (TID) | ORAL | 0 refills | Status: DC | PRN

## 2023-11-10 MED ORDER — AIRSUPRA 90-80 MCG/ACT IN AERO: 2.0000 | INHALATION_SPRAY | RESPIRATORY_TRACT | 5 refills | Status: DC | PRN

## 2023-11-10 MED ORDER — METHYLPREDNISOLONE 4 MG PO TBPK: ORAL_TABLET | ORAL | 0 refills | Status: DC

## 2023-11-10 MED ORDER — CEFDINIR 300 MG PO CAPS: 300.0000 mg | ORAL_CAPSULE | Freq: Two times a day (BID) | ORAL | 0 refills | Status: DC

## 2023-11-10 NOTE — Assessment & Plan Note (Signed)
On Coumadin 

## 2023-11-10 NOTE — Assessment & Plan Note (Signed)
Worse due to URI Start Medrol pack CXR Airsupra

## 2023-11-10 NOTE — Progress Notes (Signed)
Subjective:  Patient ID: Danielle Obrien, female    DOB: Jun 03, 1981  Age: 42 y.o. MRN: [id number]  CC: Cough (Symptoms since Saturday. Notes of congestion, wheezing, cough. PT notes of pain when coughing. PT currently treating with inhaler and tylenol. Notes of face pain. Covid- as recently as Saturday), Chest Pain (PT notes of chest pain this morning on scale of 8/9-10. ), and Fever (Fever of 105 as recently as Monday.)   HPI Danielle Obrien presents for URI x5 d - worse C/o SOB, wheezing Pt has asthma, smoker   Outpatient Medications Prior to Visit  Medication Sig Dispense Refill   albuterol (VENTOLIN HFA) 108 (90 Base) MCG/ACT inhaler INHALE TWO PUFFS BY MOUTH EVERY 6 HOURS AS NEEDED FOR WHEEZING OR SHORTNESS OF BREATH 8.5 g 1   aspirin EC 81 MG tablet Take 81 mg by mouth daily. Swallow whole.     DULoxetine (CYMBALTA) 20 MG capsule TAKE 2 CAPSULES BY MOUTH DAILY 60 capsule 0   gabapentin (NEURONTIN) 300 MG capsule Take 2 capsules (600 mg total) by mouth 3 (three) times daily. 540 capsule 1   lamoTRIgine (LAMICTAL) 200 MG tablet TAKE 2 TABLETS BY MOUTH DAILY 180 tablet 0   rosuvastatin (CRESTOR) 20 MG tablet TAKE 1 TABLET BY MOUTH DAILY 90 tablet 0   tiZANidine (ZANAFLEX) 4 MG tablet TAKE 1 TABLET BY MOUTH AT BEDTIME 30 tablet 0   traMADol (ULTRAM) 50 MG tablet TAKE 2 TABLETS BY MOUTH TWICE A DAY 120 tablet 0   warfarin (COUMADIN) 7.5 MG tablet TAKE 1 TO 1 AND 1/2 TABLETS BY MOUTH DAILY AS DIRECTED BY ANTICOAGULATION CLINIC **KEEP UPCOMING APPOINTMENT FOR REFILLS** 40 tablet 0   indapamide (LOZOL) 1.25 MG tablet Take 1 tablet (1.25 mg total) by mouth daily. (Patient not taking: Reported on 09/16/2023) 90 tablet 0   meclizine (ANTIVERT) 12.5 MG tablet TAKE ONE TABLET BY MOUTH TWICE A DAY AS NEEDED FOR DIZZINESS (Patient not taking: Reported on 10/07/2023) 20 tablet 0   metoprolol tartrate (LOPRESSOR) 25 MG tablet Take 0.5 tablets (12.5 mg total) by mouth daily. Please keep scheduled  appointment for future refills. Thank you. (Patient not taking: Reported on 10/07/2023) 90 tablet 3   olmesartan (BENICAR) 20 MG tablet Take 1 tablet (20 mg total) by mouth daily. (Patient not taking: Reported on 09/16/2023) 90 tablet 0   No facility-administered medications prior to visit.    ROS: Review of Systems  Constitutional:  Positive for chills, fatigue and fever. Negative for activity change, appetite change and unexpected weight change.  HENT:  Positive for congestion, rhinorrhea, sinus pressure and sore throat. Negative for mouth sores.   Eyes:  Negative for visual disturbance.  Respiratory:  Positive for cough and wheezing. Negative for chest tightness.   Gastrointestinal:  Negative for abdominal pain and nausea.  Genitourinary:  Negative for difficulty urinating, frequency and vaginal pain.  Musculoskeletal:  Positive for arthralgias. Negative for back pain and gait problem.  Skin:  Negative for pallor and rash.  Neurological:  Negative for dizziness, tremors, weakness, numbness and headaches.  Psychiatric/Behavioral:  Negative for confusion and sleep disturbance.     Objective:  BP (!) 172/98   Pulse 69   Temp 98.3 F (36.8 C) (Oral)   Ht 5' (1.524 m)   Wt 113 lb 6.4 oz (51.4 kg)   SpO2 97%   BMI 22.15 kg/m   BP Readings from Last 3 Encounters:  11/10/23 (!) 172/98  11/04/23 (!) 134/90  10/14/23 (!) 141/89  Wt Readings from Last 3 Encounters:  11/10/23 113 lb 6.4 oz (51.4 kg)  11/04/23 111 lb 12.8 oz (50.7 kg)  10/14/23 109 lb 9.6 oz (49.7 kg)    Physical Exam Constitutional:      General: She is not in acute distress.    Appearance: She is well-developed.  HENT:     Head: Normocephalic.     Right Ear: External ear normal.     Left Ear: External ear normal.     Nose: Nose normal.  Eyes:     General:        Right eye: No discharge.        Left eye: No discharge.     Conjunctiva/sclera: Conjunctivae normal.     Pupils: Pupils are equal, round,  and reactive to light.  Neck:     Thyroid: No thyromegaly.     Vascular: No JVD.     Trachea: No tracheal deviation.  Cardiovascular:     Rate and Rhythm: Normal rate and regular rhythm.     Heart sounds: Normal heart sounds.  Pulmonary:     Effort: No respiratory distress.     Breath sounds: No stridor. Wheezing and rhonchi present.  Abdominal:     General: Bowel sounds are normal. There is no distension.     Palpations: Abdomen is soft. There is no mass.     Tenderness: There is no abdominal tenderness. There is no guarding or rebound.  Musculoskeletal:        General: No tenderness.     Cervical back: Normal range of motion and neck supple. No rigidity.  Lymphadenopathy:     Cervical: No cervical adenopathy.  Skin:    Coloration: Skin is not cyanotic.     Findings: No erythema or rash.  Neurological:     Cranial Nerves: No cranial nerve deficit.     Motor: No abnormal muscle tone.     Coordination: Coordination normal.     Deep Tendon Reflexes: Reflexes normal.  Psychiatric:        Behavior: Behavior normal. Behavior is not agitated.        Thought Content: Thought content normal.        Judgment: Judgment normal.     Lab Results  Component Value Date   WBC 5.9 11/04/2023   HGB 14.9 11/04/2023   HCT 42.8 11/04/2023   PLT 149 (L) 11/04/2023   GLUCOSE 102 (H) 07/14/2023   CHOL 195 02/11/2023   TRIG 67.0 02/11/2023   HDL 77.90 02/11/2023   LDLDIRECT 171.0 01/27/2013   LDLCALC 104 (H) 02/11/2023   ALT 16 09/09/2023   AST 23 09/09/2023   NA 139 07/14/2023   K 4.2 07/14/2023   CL 100 07/14/2023   CREATININE 0.79 07/14/2023   BUN <5 (L) 07/14/2023   CO2 28 07/14/2023   TSH 0.68 09/09/2023   INR 3.3 (A) 10/15/2023   HGBA1C 5.3 12/22/2018    No results found.  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    Dispense:  20 capsule    Refill:  0   methylPREDNISolone (MEDROL DOSEPAK) 4 MG TBPK tablet    Sig: As directed    Dispense:  21 tablet    Refill:  0   HYDROcodone bit-homatropine (HYCODAN) 5-1.5 MG/5ML syrup    Sig: Take 5 mLs by mouth every 8 (eight) hours as needed for cough.    Dispense:  240 mL    Refill:  0   Albuterol-Budesonide (AIRSUPRA) 90-80 MCG/ACT AERO    Sig: Inhale 2 Inhalations into the lungs every 4 (four) hours as needed.    Dispense:  30 g    Refill:  5      Follow-up: Return for f/u with PCP.  Sonda Primes, MD

## 2023-11-10 NOTE — Assessment & Plan Note (Signed)
Start Medrol pack Omnicef po CXR Airsupra

## 2023-11-11 ENCOUNTER — Ambulatory Visit: Payer: Managed Care, Other (non HMO)

## 2023-11-12 ENCOUNTER — Telehealth: Payer: Self-pay | Admitting: Cardiology

## 2023-11-12 NOTE — Telephone Encounter (Signed)
Patient called in requesting to talk with Dr. Malachy Mood RN, Alcario Drought. Advised Alcario Drought is off today but that a triage nurse will call back.  She states that she believes she has pneumonia. She seen Plotnikov on 12/11 regarding this. Was not happy with this visit as she states she was prescribed medications that she can't take d/t coumadin, and states she let Plotnikov know this, and states he let her leave with a BP of 172/98. She would like to talk with triage nurse regarding how she's feeling and the best course to take. She stated a x-ray was done and was listed as "urgent", was done at the office but has yet to hear back from Plotnikov regarding results.

## 2023-11-12 NOTE — Telephone Encounter (Signed)
Called pt and made her aware Cefdinir should not affect her INR and she should take her medication as prescribed. Pt was also prescribed methylPREDNISolone (MEDROL DOSEPAK) 4 MG TBPK tablet. Pt will start steroid today. Scheduled coumadin clinic appt on Monday to check INR ay 8am.

## 2023-11-12 NOTE — Telephone Encounter (Signed)
Called patient about message. Patient was prescribed Cefdinir and she is afraid to take it due to being on warfarin. Will send message to Coumadin clinic to see if they can direct patient on what to do.

## 2023-11-15 ENCOUNTER — Telehealth: Payer: Self-pay | Admitting: Cardiology

## 2023-11-15 ENCOUNTER — Ambulatory Visit: Payer: Managed Care, Other (non HMO) | Attending: Cardiovascular Disease

## 2023-11-15 DIAGNOSIS — Z7901 Long term (current) use of anticoagulants: Secondary | ICD-10-CM | POA: Diagnosis not present

## 2023-11-15 DIAGNOSIS — Z954 Presence of other heart-valve replacement: Secondary | ICD-10-CM | POA: Diagnosis not present

## 2023-11-15 LAB — POCT INR: INR: 6.7 — AB (ref 2.0–3.0)

## 2023-11-15 LAB — PROTIME-INR
INR: 5.5 (ref 0.9–1.2)
Prothrombin Time: 54.3 s — ABNORMAL HIGH (ref 9.1–12.0)

## 2023-11-15 NOTE — Telephone Encounter (Signed)
[employer] [employer] calling with critical labs for this patient.

## 2023-11-15 NOTE — Patient Instructions (Addendum)
Description   *PLEASE PUT ON APPT NO ONSITE INTERPRETER NEEDED*   STAT INR 5.5. Called and instructed pt to HOLD Warfarin today, tomorrow, and Wednesday and then resume taking 1.5 tablets daily except for 1 tablet on Monday, Wednesdays, and Fridays.  SEEK IMMEDIATE MEDICAL ATTENTION IF SIGNS OR SYMPTOMS OF BLEEDING OCCUR.  Stay consistent with greens each week (2 per week)  Recheck INR in 1 week.   Coumadin Clinic [phone]

## 2023-11-15 NOTE — Telephone Encounter (Signed)
Called and spoke with pt regarding INR results and provided Warfarin dosing instructions. Please refer to anticoagulation encounter.

## 2023-11-15 NOTE — Telephone Encounter (Signed)
Shawn with [facility] called with PT/ INR...   PT 54.3 INR 5.5  Will send to anticoag clinic asap.

## 2023-11-17 ENCOUNTER — Other Ambulatory Visit: Payer: Self-pay | Admitting: Cardiology

## 2023-11-17 DIAGNOSIS — Z954 Presence of other heart-valve replacement: Secondary | ICD-10-CM

## 2023-11-17 DIAGNOSIS — Q230 Congenital stenosis of aortic valve: Secondary | ICD-10-CM

## 2023-11-17 DIAGNOSIS — Z7901 Long term (current) use of anticoagulants: Secondary | ICD-10-CM

## 2023-11-17 NOTE — Telephone Encounter (Signed)
Refill request for warfarin:  Last INR was 5.5 on 11/15/23 Next INR due 11/22/23 LOV was 05/08/22  Pt is due for MD appt.  Message sent to schedulers.  Refill approved x 1.

## 2023-11-22 ENCOUNTER — Ambulatory Visit: Payer: Managed Care, Other (non HMO) | Attending: Cardiology

## 2023-11-22 DIAGNOSIS — Z954 Presence of other heart-valve replacement: Secondary | ICD-10-CM | POA: Diagnosis not present

## 2023-11-22 DIAGNOSIS — Q230 Congenital stenosis of aortic valve: Secondary | ICD-10-CM | POA: Diagnosis not present

## 2023-11-22 DIAGNOSIS — Z7901 Long term (current) use of anticoagulants: Secondary | ICD-10-CM

## 2023-11-22 DIAGNOSIS — Q2381 Bicuspid aortic valve: Secondary | ICD-10-CM | POA: Diagnosis not present

## 2023-11-22 LAB — POCT INR: INR: 1.6 — AB (ref 2.0–3.0)

## 2023-11-22 NOTE — Patient Instructions (Signed)
*  PLEASE PUT ON APPT NO ONSITE INTERPRETER NEEDED*   Take another 0.5 tablet today only then continue taking 1.5 tablets daily except for 1 tablet on Monday, Wednesdays, and Fridays.  SEEK IMMEDIATE MEDICAL ATTENTION IF SIGNS OR SYMPTOMS OF BLEEDING OCCUR.  Stay consistent with greens each week (2 per week)  Recheck INR in 3 week.   Coumadin Clinic [phone]

## 2023-12-04 ENCOUNTER — Other Ambulatory Visit: Payer: Self-pay | Admitting: Family Medicine

## 2023-12-04 ENCOUNTER — Other Ambulatory Visit: Payer: Self-pay | Admitting: Internal Medicine

## 2023-12-04 DIAGNOSIS — I10 Essential (primary) hypertension: Secondary | ICD-10-CM

## 2023-12-09 ENCOUNTER — Other Ambulatory Visit: Payer: Managed Care, Other (non HMO) | Admitting: Pharmacist

## 2023-12-09 DIAGNOSIS — I10 Essential (primary) hypertension: Secondary | ICD-10-CM

## 2023-12-09 NOTE — Progress Notes (Signed)
  Stress with new job    12/09/2023 Name: Danielle Obrien MRN: [id number] DOB: 1981-01-26  Chief Complaint  Patient presents with   Hypertension   Medication Management     Danielle Obrien is a 43 y.o. year old female who presented for a telephone visit.   They were referred to the pharmacist by their PCP for assistance in managing hypertension.   Subjective:  Care Team: Primary Care Provider: Joshua Debby LITTIE, MD ; Next Scheduled Visit: 12/15/2022  Medication Access/Adherence  Current Pharmacy:  [facility] [id number] - [place], [place] - [address] [place] [address] Phone: [phone] Fax: [phone]  [id number] - [place], [place] - [address] AT [facility] [address] [place] [address] Phone: [phone] Fax: [phone]  [facility] [address] [place] [address] Phone: [phone] Fax: [phone]   Patient reports affordability concerns with their medications: No  Patient reports access/transportation concerns to their pharmacy: No  Patient reports adherence concerns with their medications:  No     Hypertension:  Current medications: metoprolol  tartrate 25 mg 1/2 tablet daily (migraine prevention?) **Pt did not start indapamide  or olmesartan  prescribed 10/10. She notes that she was very upset/crying right when they checked her BP and she has been under a lot of stress lately (has been out of work ~4 months). She is weary to take extra medication based on the office BP when she was upset.  Patient has a validated, automated, upper arm home BP cuff Current home blood pressure readings: Pt reports Avg 130-135 systolic at home.    Objective:  BP Readings from Last 3 Encounters:  11/10/23 (!) 172/98  11/04/23 (!) 134/90  10/14/23 (!) 141/89     Lab Results  Component Value Date   HGBA1C 5.3 12/22/2018    Lab Results   Component Value Date   CREATININE 0.79 07/14/2023   BUN <5 (L) 07/14/2023   NA 139 07/14/2023   K 4.2 07/14/2023   CL 100 07/14/2023   CO2 28 07/14/2023    Lab Results  Component Value Date   CHOL 195 02/11/2023   HDL 77.90 02/11/2023   LDLCALC 104 (H) 02/11/2023   LDLDIRECT 171.0 01/27/2013   TRIG 67.0 02/11/2023   CHOLHDL 3 02/11/2023    Medications Reviewed Today   Medications were not reviewed in this encounter      Assessment/Plan:   Hypertension: - Currently borderline controlled, BP goal <130/80 - Recommended to check home blood pressure and heart rate daily -BP readings are borderline. Pt wishes to continue monitoring for now. Informed pt to notify us  if having readings int he 140s at home -Educated on risks of keeping elevated BP uncontrolled.    Follow Up Plan: PCP f/u on 1/16  Darrelyn Drum, PharmD, BCPS [facility] [phone]

## 2023-12-12 ENCOUNTER — Other Ambulatory Visit: Payer: Self-pay | Admitting: Cardiology

## 2023-12-12 DIAGNOSIS — Q2381 Bicuspid aortic valve: Secondary | ICD-10-CM

## 2023-12-12 DIAGNOSIS — Z954 Presence of other heart-valve replacement: Secondary | ICD-10-CM

## 2023-12-12 DIAGNOSIS — Z7901 Long term (current) use of anticoagulants: Secondary | ICD-10-CM

## 2023-12-14 ENCOUNTER — Other Ambulatory Visit: Payer: Self-pay | Admitting: Family Medicine

## 2023-12-16 ENCOUNTER — Ambulatory Visit: Payer: Managed Care, Other (non HMO) | Admitting: Internal Medicine

## 2023-12-16 ENCOUNTER — Ambulatory Visit (INDEPENDENT_AMBULATORY_CARE_PROVIDER_SITE_OTHER): Payer: Managed Care, Other (non HMO)

## 2023-12-16 ENCOUNTER — Encounter: Payer: Self-pay | Admitting: Internal Medicine

## 2023-12-16 VITALS — BP 148/88 | HR 95 | Temp 97.7°F | Resp 16 | Ht 60.0 in | Wt 108.2 lb

## 2023-12-16 DIAGNOSIS — J189 Pneumonia, unspecified organism: Secondary | ICD-10-CM

## 2023-12-16 DIAGNOSIS — Z23 Encounter for immunization: Secondary | ICD-10-CM

## 2023-12-16 DIAGNOSIS — I10 Essential (primary) hypertension: Secondary | ICD-10-CM

## 2023-12-16 DIAGNOSIS — Z7901 Long term (current) use of anticoagulants: Secondary | ICD-10-CM | POA: Diagnosis not present

## 2023-12-16 DIAGNOSIS — Z5181 Encounter for therapeutic drug level monitoring: Secondary | ICD-10-CM | POA: Diagnosis not present

## 2023-12-16 DIAGNOSIS — D696 Thrombocytopenia, unspecified: Secondary | ICD-10-CM | POA: Diagnosis not present

## 2023-12-16 LAB — CBC WITH DIFFERENTIAL/PLATELET
Basophils Absolute: 0.1 10*3/uL (ref 0.0–0.1)
Basophils Relative: 1 % (ref 0.0–3.0)
Eosinophils Absolute: 0.2 10*3/uL (ref 0.0–0.7)
Eosinophils Relative: 3.2 % (ref 0.0–5.0)
HCT: 47.5 % — ABNORMAL HIGH (ref 36.0–46.0)
Hemoglobin: 16.2 g/dL — ABNORMAL HIGH (ref 12.0–15.0)
Lymphocytes Relative: 45.1 % (ref 12.0–46.0)
Lymphs Abs: 2.5 10*3/uL (ref 0.7–4.0)
MCHC: 34.1 g/dL (ref 30.0–36.0)
MCV: 93.4 fL (ref 78.0–100.0)
Monocytes Absolute: 0.5 10*3/uL (ref 0.1–1.0)
Monocytes Relative: 8.4 % (ref 3.0–12.0)
Neutro Abs: 2.4 10*3/uL (ref 1.4–7.7)
Neutrophils Relative %: 42.3 % — ABNORMAL LOW (ref 43.0–77.0)
Platelets: 142 10*3/uL — ABNORMAL LOW (ref 150.0–400.0)
RBC: 5.09 Mil/uL (ref 3.87–5.11)
RDW: 13.8 % (ref 11.5–15.5)
WBC: 5.6 10*3/uL (ref 4.0–10.5)

## 2023-12-16 LAB — BASIC METABOLIC PANEL
BUN: 10 mg/dL (ref 6–23)
CO2: 29 meq/L (ref 19–32)
Calcium: 9.6 mg/dL (ref 8.4–10.5)
Chloride: 102 meq/L (ref 96–112)
Creatinine, Ser: 0.9 mg/dL (ref 0.40–1.20)
GFR: 78.25 mL/min (ref >60.00)
Glucose, Bld: 63 mg/dL — ABNORMAL LOW (ref 70–99)
Potassium: 3.8 meq/L (ref 3.5–5.1)
Sodium: 139 meq/L (ref 135–145)

## 2023-12-16 LAB — PROTIME-INR
INR: 3.9 {ratio} — ABNORMAL HIGH (ref 0.8–1.0)
Prothrombin Time: 38.9 s — ABNORMAL HIGH (ref 9.6–13.1)

## 2023-12-16 NOTE — Progress Notes (Unsigned)
Subjective:  Patient ID: Danielle Obrien, female    DOB: 15-Jul-1981  Age: 43 y.o. MRN: [id number]  CC: Sinusitis, Hypertension, and Cough  SLI used  HPI Danielle Obrien presents for f/up ----  Discussed the use of AI scribe software for clinical note transcription with the patient, who gave verbal consent to proceed.  History of Present Illness   The patient, with a history of smoking and recent pneumonia, has been experiencing persistent coughing with lime green phlegm since early December. The cough is associated with wheezing, particularly severe in the mornings and middle of the night, likely due to postnasal drainage. She also reports intermittent epistaxis from the right nostril, lasting for about 2-3 minutes each episode.  Despite these symptoms, the patient notes a significant improvement in her overall health, estimating a 90% improvement compared to a month prior. She completed a course of antibiotics and steroids, but no longer takes cough medicine.  In addition to respiratory symptoms, the patient reports frequent night sweats and mild shortness of breath, particularly in cold air. She also experiences headaches and significant facial pain, which she attributes to sinus drainage.  The patient acknowledges her smoking habit but has made efforts to reduce consumption, now down to half a pack a day.   Regarding her hypertension, the patient confirms adherence to her blood pressure medication, taken 30 minutes prior to the consultation.   The patient declined both flu and pneumonia vaccines during the consultation. She had a pneumonia vaccine ten years ago and expressed willingness to get another from the pharmacy.       Outpatient Medications Prior to Visit  Medication Sig Dispense Refill   albuterol (VENTOLIN HFA) 108 (90 Base) MCG/ACT inhaler INHALE TWO PUFFS BY MOUTH EVERY 6 HOURS AS NEEDED FOR WHEEZING OR SHORTNESS OF BREATH 8.5 g 1   Albuterol-Budesonide (AIRSUPRA)  90-80 MCG/ACT AERO Inhale 2 Inhalations into the lungs every 4 (four) hours as needed. 30 g 5   aspirin EC 81 MG tablet Take 81 mg by mouth daily. Swallow whole.     cefdinir (OMNICEF) 300 MG capsule Take 1 capsule (300 mg total) by mouth 2 (two) times daily. 20 capsule 0   DULoxetine (CYMBALTA) 20 MG capsule TAKE 2 CAPSULES BY MOUTH DAILY 60 capsule 0   gabapentin (NEURONTIN) 300 MG capsule Take 2 capsules (600 mg total) by mouth 3 (three) times daily. 540 capsule 1   indapamide (LOZOL) 1.25 MG tablet TAKE 1 TABLET BY MOUTH DAILY 90 tablet 0   lamoTRIgine (LAMICTAL) 200 MG tablet TAKE 2 TABLETS BY MOUTH DAILY 180 tablet 0   meclizine (ANTIVERT) 12.5 MG tablet TAKE ONE TABLET BY MOUTH TWICE A DAY AS NEEDED FOR DIZZINESS 20 tablet 0   metoprolol tartrate (LOPRESSOR) 25 MG tablet Take 0.5 tablets (12.5 mg total) by mouth daily. Please keep scheduled appointment for future refills. Thank you. 90 tablet 3   olmesartan (BENICAR) 20 MG tablet TAKE 1 TABLET BY MOUTH DAILY 90 tablet 0   rosuvastatin (CRESTOR) 20 MG tablet TAKE 1 TABLET BY MOUTH DAILY 90 tablet 0   tiZANidine (ZANAFLEX) 4 MG tablet TAKE 1 TABLET BY MOUTH AT BEDTIME 30 tablet 0   traMADol (ULTRAM) 50 MG tablet TAKE 2 TABLETS BY MOUTH TWICE A DAY 120 tablet 0   warfarin (COUMADIN) 7.5 MG tablet TAKE 1 TO 1 AND 1/2 TABLETS BY MOUTH DAILY AS DIRECTED BY ANTICOAGULATION CLINIC 40 tablet 0   methylPREDNISolone (MEDROL DOSEPAK) 4 MG TBPK tablet As  directed (Patient not taking: Reported on 12/16/2023) 21 tablet 0   HYDROcodone bit-homatropine (HYCODAN) 5-1.5 MG/5ML syrup Take 5 mLs by mouth every 8 (eight) hours as needed for cough. (Patient not taking: Reported on 12/16/2023) 240 mL 0   No facility-administered medications prior to visit.    ROS Review of Systems  Constitutional: Negative.  Negative for chills, diaphoresis, fatigue and unexpected weight change.  HENT:  Positive for nosebleeds. Negative for facial swelling, sinus pressure,  sneezing, sore throat, trouble swallowing and voice change.   Respiratory:  Positive for cough and shortness of breath. Negative for chest tightness, wheezing and stridor.   Cardiovascular:  Negative for chest pain, palpitations and leg swelling.  Gastrointestinal: Negative.  Negative for abdominal pain, diarrhea, nausea and vomiting.  Genitourinary: Negative.  Negative for difficulty urinating.  Musculoskeletal: Negative.  Negative for arthralgias and myalgias.  Skin: Negative.   Neurological:  Negative for dizziness and weakness.  Hematological:  Negative for adenopathy. Does not bruise/bleed easily.  Psychiatric/Behavioral: Negative.      Objective:  BP (!) 148/88 (BP Location: Left Arm, Patient Position: Sitting, Cuff Size: Normal)   Pulse 95   Temp 97.7 F (36.5 C) (Oral)   Resp 16   Ht 5' (1.524 m)   Wt 108 lb 3.2 oz (49.1 kg)   SpO2 97%   BMI 21.13 kg/m   BP Readings from Last 3 Encounters:  12/16/23 (!) 148/88  11/10/23 (!) 172/98  11/04/23 (!) 134/90    Wt Readings from Last 3 Encounters:  12/16/23 108 lb 3.2 oz (49.1 kg)  11/10/23 113 lb 6.4 oz (51.4 kg)  11/04/23 111 lb 12.8 oz (50.7 kg)    Physical Exam Vitals reviewed.  Constitutional:      General: She is not in acute distress.    Appearance: She is not ill-appearing, toxic-appearing or diaphoretic.  HENT:     Right Ear: Hearing, tympanic membrane, ear canal and external ear normal.     Left Ear: Hearing, tympanic membrane, ear canal and external ear normal.     Nose: Nose normal. No mucosal edema, congestion or rhinorrhea.     Right Nostril: No epistaxis.     Left Nostril: No epistaxis.     Right Sinus: No maxillary sinus tenderness or frontal sinus tenderness.     Left Sinus: No maxillary sinus tenderness or frontal sinus tenderness.     Mouth/Throat:     Mouth: Mucous membranes are moist.  Eyes:     General: No scleral icterus.    Conjunctiva/sclera: Conjunctivae normal.  Cardiovascular:      Rate and Rhythm: Normal rate and regular rhythm.     Heart sounds: No murmur heard.    No friction rub. No gallop.  Pulmonary:     Effort: Pulmonary effort is normal.     Breath sounds: Examination of the left-lower field reveals rales. Rales present. No decreased breath sounds, wheezing or rhonchi.  Abdominal:     General: Abdomen is flat.     Palpations: There is no mass.     Tenderness: There is no abdominal tenderness. There is no guarding.     Hernia: No hernia is present.  Musculoskeletal:        General: Normal range of motion.     Right lower leg: No edema.     Left lower leg: No edema.  Skin:    General: Skin is warm and dry.     Findings: No ecchymosis or petechiae.  Neurological:  General: No focal deficit present.     Mental Status: She is alert. Mental status is at baseline.     Lab Results  Component Value Date   WBC 5.6 12/16/2023   HGB 16.2 (H) 12/16/2023   HCT 47.5 (H) 12/16/2023   PLT 142.0 (L) 12/16/2023   GLUCOSE 63 (L) 12/16/2023   CHOL 195 02/11/2023   TRIG 67.0 02/11/2023   HDL 77.90 02/11/2023   LDLDIRECT 171.0 01/27/2013   LDLCALC 104 (H) 02/11/2023   ALT 16 09/09/2023   AST 23 09/09/2023   NA 139 12/16/2023   K 3.8 12/16/2023   CL 102 12/16/2023   CREATININE 0.90 12/16/2023   BUN 10 12/16/2023   CO2 29 12/16/2023   TSH 0.68 09/09/2023   INR 3.9 (H) 12/16/2023   HGBA1C 5.3 12/22/2018    DG Chest 2 View Result Date: 12/16/2023 CLINICAL DATA:  Left lower lobe pneumonia follow-up EXAM: CHEST - 2 VIEW COMPARISON:  11/10/2023 FINDINGS: Borderline heart size with normal pulmonary vascularity. 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 Lungs are clear. The focal area of infiltration seen in the left in the previous study is no longer present. No pleural effusions. No pneumothorax. Mediastinal contours appear intact. IMPRESSION: Interval resolution of previous left lung infiltrate. Lungs are clear. Electronically  Signed   By: Burman Nieves M.D.   On: 12/16/2023 11:01   DG Chest 2 View Result Date: 11/10/2023 CLINICAL DATA:  Cough and shortness of breath EXAM: CHEST - 2 VIEW COMPARISON:  06/14/2023 FINDINGS: Normal heart size and vascularity. Left lower lung mild patchy bronchovascular opacity along the left cardiac border consistent with left lower lung pneumonia. Right lung remains clear. Postop changes from cardiac valve surgery. Negative for edema, effusion pneumothorax. Trachea midline. IMPRESSION: Left lower lung pneumonia. Recommend radiographic follow-up after medical therapy to document resolution. Electronically Signed   By: Judie Petit.  Shick M.D.   On: 11/10/2023 09:22     No results found.    Assessment & Plan:   Essential hypertension, benign- Her BP has improved. -     Basic metabolic panel; Future  Long term (current) use of anticoagulants [Z79.01] -     Protime-INR; Future  Thrombocytopenia (HCC) -     CBC with Differential/Platelet; Future  Pneumonia of left lower lobe due to infectious organism- The PNA has resolved. -     DG Chest 2 View; Future -     HYDROcodone Bit-Homatrop MBr; Take 5 mLs by mouth every 8 (eight) hours as needed for cough.  Dispense: 240 mL; Refill: 0  Need for vaccination against Streptococcus pneumoniae using pneumococcal conjugate vaccine 13 -     Pneumococcal conjugate vaccine 20-valent; Future     Follow-up: Return in about 3 months (around 03/15/2024).  Sanda Linger, MD

## 2023-12-16 NOTE — Patient Instructions (Signed)
Description   *PLEASE PUT ON APPT NO ONSITE INTERPRETER NEEDED*   HOLD tomorrow's dose and then continue taking 1.5 tablets daily except for 1 tablet on Monday, Wednesdays, and Fridays.  SEEK IMMEDIATE MEDICAL ATTENTION IF SIGNS OR SYMPTOMS OF BLEEDING OCCUR.  Stay consistent with greens each week (2 per week)  Recheck INR in 2 week.   Coumadin Clinic [phone]

## 2023-12-16 NOTE — Patient Instructions (Signed)
 Community-Acquired Pneumonia, Adult  Pneumonia is a lung infection that causes inflammation and the buildup of mucus and fluids in the lungs. This may cause coughing and difficulty breathing. Community-acquired pneumonia is pneumonia that develops in people who are not, and have not recently been, in a hospital or other health care facility.  Usually, pneumonia develops as a result of an illness that is caused by a virus, such as the common cold and the flu (influenza). It can also be caused by bacteria or fungi. While the common cold and influenza can pass from person to person (are contagious), pneumonia itself is not considered contagious.  What are the causes?    This condition may be caused by:  Viruses.  Bacteria.  Fungi.  What increases the risk?  The following factors may make you more likely to develop this condition:  Being over age 8 or having certain medical conditions, such as:  A long-term (chronic) disease, such as: chronic obstructive pulmonary disease (COPD), asthma, heart failure, diabetes, or kidney disease.  A condition that increases the risk of breathing in (aspirating) mucus and other fluids from your mouth and nose.  A weakened body defense system (immune system).  Having had your spleen removed (splenectomy). The spleen is the organ that helps fight germs and infections.  Not cleaning your teeth and gums well (poor dental hygiene).  Using tobacco products.  Traveling to places where germs that cause pneumonia are present or being near certain animals or animal habitats that could have germs that cause pneumonia.  What are the signs or symptoms?  Symptoms of this condition include:  A dry cough or a wet (productive) cough.  A fever, sweating, or chills.  Chest pain, especially when breathing deeply or coughing.  Fast breathing, difficulty breathing, or shortness of breath.  Tiredness (fatigue) and muscle aches.  How is this diagnosed?    This condition may be diagnosed based on your medical  history or a physical exam. You may also have tests, including:  Imaging, such as a chest X-ray or lung ultrasound.  Tests of:  The level of oxygen and other gases in your blood.  Mucus from your lungs (sputum).  Fluid around your lungs (pleural fluid).  Your urine.  How is this treated?  Treatment for this condition depends on many factors, such as the cause of your pneumonia, your medicines, and other medical conditions that you have.  For most adults, pneumonia may be treated at home. In some cases, treatment must happen in a hospital and may include:  Medicines that are given by mouth (orally) or through an IV, including:  Antibiotic medicines, if bacteria caused the pneumonia.  Medicines that kill viruses (antiviral medicines), if a virus caused the pneumonia.  Oxygen therapy.  Severe pneumonia, although rare, may require the following treatments:  Mechanical ventilation.This procedure uses a machine to help you breathe if you cannot breathe well on your own or maintain a safe level of blood oxygen.  Thoracentesis. This procedure removes any buildup of pleural fluid to help with breathing.  Follow these instructions at home:    Medicines  Take over-the-counter and prescription medicines only as told by your health care provider.  Take cough medicine only if you have trouble sleeping. Cough medicine can prevent your body from removing mucus from your lungs.  If you were prescribed antibiotics, take them as told by your health care provider. Do not stop taking the antibiotic even if you start to feel better.  Lifestyle  Do not drink alcohol.  Do not use any products that contain nicotine or tobacco. These products include cigarettes, chewing tobacco, and vaping devices, such as e-cigarettes. If you need help quitting, ask your health care provider.  Eat a healthy diet. This includes plenty of vegetables, fruits, whole grains, low-fat dairy products, and lean protein.  General instructions  Rest a lot and  get at least 8 hours of sleep each night.  Sleep in a partly upright position at night. Place a few pillows under your head or sleep in a reclining chair.  Return to your normal activities as told by your health care provider. Ask your health care provider what activities are safe for you.  Drink enough fluid to keep your urine pale yellow. This helps to thin the mucus in your lungs.  If your throat is sore, gargle with a mixture of salt and water 3-4 times a day or as needed. To make salt water, completely dissolve -1 tsp (3-6 g) of salt in 1 cup (237 mL) of warm water.  Keep all follow-up visits.  How is this prevented?  You can lower your risk of developing community-acquired pneumonia by:  Getting the pneumonia vaccine. There are different types and schedules of pneumonia vaccines. Ask your health care provider which option is best for you. Consider getting the pneumonia vaccine if:  You are older than 44 years of age.  You are 78-57 years of age and are receiving cancer treatment, have chronic lung disease, or have other medical conditions that affect your immune system. Ask your health care provider if this applies to you.  Getting your influenza vaccine every year. Ask your health care provider which type of vaccine is best for you.  Getting regular dental checkups.  Washing your hands often with soap and water for at least 20 seconds. If soap and water are not available, use hand sanitizer.  Contact a health care provider if:  You have a fever.  You have trouble sleeping because you cannot control your cough with cough medicine.  Get help right away if:  Your shortness of breath becomes worse.  Your chest pain increases.  Your sickness becomes worse, especially if you are an older adult or have a weak immune system.  You cough up blood.  These symptoms may be an emergency. Get help right away. Call 911.  Do not wait to see if the symptoms will go away.  Do not drive yourself to the  hospital.  Summary  Pneumonia is an infection of the lungs.  Community-acquired pneumonia develops in people who have not been in the hospital. It can be caused by bacteria, viruses, or fungi.  This condition may be treated with antibiotics or antiviral medicines.  Severe pneumonia may require a hospital stay and treatment to help with breathing.  This information is not intended to replace advice given to you by your health care provider. Make sure you discuss any questions you have with your health care provider.  Document Revised: 01/14/2022 Document Reviewed: 01/14/2022  Elsevier Patient Education  2024 [employer].

## 2023-12-17 MED ORDER — HYDROCODONE BIT-HOMATROP MBR 5-1.5 MG/5ML PO SOLN: 5.0000 mL | Freq: Three times a day (TID) | ORAL | 0 refills | Status: DC | PRN

## 2023-12-17 NOTE — Progress Notes (Signed)
Please let her know the pneumonia has resolved Ask her to let me know how she is doing

## 2023-12-23 ENCOUNTER — Ambulatory Visit: Payer: Managed Care, Other (non HMO)

## 2023-12-24 ENCOUNTER — Other Ambulatory Visit: Payer: Self-pay | Admitting: Internal Medicine

## 2023-12-24 DIAGNOSIS — F3177 Bipolar disorder, in partial remission, most recent episode mixed: Secondary | ICD-10-CM

## 2023-12-27 ENCOUNTER — Ambulatory Visit: Payer: Self-pay | Admitting: Internal Medicine

## 2023-12-27 NOTE — Telephone Encounter (Signed)
Copied from CRM [id number]. Topic: Clinical - Red Word Triage >> Dec 27, 2023 11:03 AM Irine Seal wrote: [facility] that prompted transfer to Nurse Triage: throat burns, shortness of breath, chest pain, muscles are on fire, tinnitus not able to eat for 5 days, diarrhea, everything smells and tastes awful. no fever present, fever broke last week. patient thought it was the flu and is has not gotten better. she is in severe pain and can barely move, no energy, patient is on blood thinners, and legs are covered in bruises and states she has not fell. She is having difficulty speaking because she is having a very hard time breathing.   Chief Complaint: COVID like symptoms  Symptoms: Shortness of breath, chest pain, cough, fever (resolved), muscle aches, diarrhea, changes to smell and taste, fatigue  Frequency: Constant, worsening  Pertinent Negatives: Patient denies current fever  Disposition: [x] ED /[] Urgent Care (no appt availability in office) / [] Appointment(In office/virtual)/ []  [place] [facility]/ [] Home Care/ [] Refused Recommended Disposition /[] [place] [facility]/ []  Follow-up with PCP Additional Notes: Patient reports that 4 days ago she began to experience cough, sore throat, chest pain, shortness of breath, diarrhea, changtes to smell and taste, fatigue, and fever. She states that her fever is resolved but her other symptoms are worsening. Patient advised that with her chest pain and shortness of breath she should go tot he ED for evaluation. Patient understood and is agreeable with this plan.     Reason for Disposition  SEVERE or constant chest pain or pressure  (Exception: Mild central chest pain, present only when coughing.)  Answer Assessment - Initial Assessment Questions 1. COVID-19 DIAGNOSIS: "How do you know that you have COVID?" (e.g., positive lab test or self-test, diagnosed by doctor or NP/PA, symptoms after exposure).     No diagnosis  2. COVID-19 EXPOSURE: "Was there any  known exposure to COVID before the symptoms began?" CDC Definition of close contact: within 6 feet (2 meters) for a total of 15 minutes or more over a 24-hour period.      Works at [employer]  3. ONSET: "When did the COVID-19 symptoms start?"      4 days ago  4. WORST SYMPTOM: "What is your worst symptom?" (e.g., cough, fever, shortness of breath, muscle aches)     Shortness of breath and chest pain  5. COUGH: "Do you have a cough?" If Yes, ask: "How bad is the cough?"       Yes, "feels like I still have pneumonia" 6. FEVER: "Do you have a fever?" If Yes, ask: "What is your temperature, how was it measured, and when did it start?"     Fevers broke 2 days ago 7. RESPIRATORY STATUS: "Describe your breathing?" (e.g., normal; shortness of breath, wheezing, unable to speak)      Short of breath  8. BETTER-SAME-WORSE: "Are you getting better, staying the same or getting worse compared to yesterday?"  If getting worse, ask, "In what way?"     Worse 9. OTHER SYMPTOMS: "Do you have any other symptoms?"  (e.g., chills, fatigue, headache, loss of smell or taste, muscle pain, sore throat)     Chest pain, muscle pain, loss of appetite, changes to taste and smell, fatigue  10. HIGH RISK DISEASE: "Do you have any chronic medical problems?" (e.g., asthma, heart or lung disease, weak immune system, obesity, etc.)       Mechanical aortic valve replacement  11. VACCINE: "Have you had the COVID-19 vaccine?" If  Yes, ask: "Which one, how many shots, when did you get it?"       Yes at the beginning  12. PREGNANCY: "Is there any chance you are pregnant?" "When was your last menstrual period?"       No 13. O2 SATURATION MONITOR:  "Do you use an oxygen saturation monitor (pulse oximeter) at home?" If Yes, ask "What is your reading (oxygen level) today?" "What is your usual oxygen saturation reading?" (e.g., 95%)       No  Protocols used: Coronavirus (COVID-19) Diagnosed or Suspected-A-AH

## 2023-12-28 ENCOUNTER — Emergency Department (HOSPITAL_COMMUNITY)
Admission: EM | Admit: 2023-12-28 | Discharge: 2023-12-28 | Disposition: A | Discharge status: Home / Self Care | Payer: Managed Care, Other (non HMO)

## 2023-12-28 ENCOUNTER — Emergency Department (HOSPITAL_COMMUNITY): Payer: Managed Care, Other (non HMO)

## 2023-12-28 ENCOUNTER — Encounter (HOSPITAL_COMMUNITY): Payer: Self-pay

## 2023-12-28 ENCOUNTER — Other Ambulatory Visit: Payer: Self-pay

## 2023-12-28 DIAGNOSIS — J09X2 Influenza due to identified novel influenza A virus with other respiratory manifestations: Secondary | ICD-10-CM | POA: Diagnosis not present

## 2023-12-28 DIAGNOSIS — J111 Influenza due to unidentified influenza virus with other respiratory manifestations: Secondary | ICD-10-CM

## 2023-12-28 DIAGNOSIS — Z7901 Long term (current) use of anticoagulants: Secondary | ICD-10-CM | POA: Insufficient documentation

## 2023-12-28 DIAGNOSIS — Z7982 Long term (current) use of aspirin: Secondary | ICD-10-CM | POA: Insufficient documentation

## 2023-12-28 DIAGNOSIS — Z20822 Contact with and (suspected) exposure to covid-19: Secondary | ICD-10-CM | POA: Diagnosis not present

## 2023-12-28 DIAGNOSIS — R059 Cough, unspecified: Secondary | ICD-10-CM | POA: Diagnosis present

## 2023-12-28 LAB — D-DIMER, QUANTITATIVE: D-Dimer, Quant: <0.27 ug{FEU}/mL (ref 0.00–0.50)

## 2023-12-28 LAB — CBC
HCT: 49.6 % — ABNORMAL HIGH (ref 36.0–46.0)
Hemoglobin: 17.1 g/dL — ABNORMAL HIGH (ref 12.0–15.0)
MCH: 31.1 pg (ref 26.0–34.0)
MCHC: 34.5 g/dL (ref 30.0–36.0)
MCV: 90.3 fL (ref 80.0–100.0)
Platelets: 80 10*3/uL — ABNORMAL LOW (ref 150–400)
RBC: 5.49 MIL/uL — ABNORMAL HIGH (ref 3.87–5.11)
RDW: 12.8 % (ref 11.5–15.5)
WBC: 4.8 10*3/uL (ref 4.0–10.5)
nRBC: 0 % (ref 0.0–0.2)

## 2023-12-28 LAB — RESP PANEL BY RT-PCR (RSV, FLU A&B, COVID)  RVPGX2
Influenza A by PCR: POSITIVE — AB
Influenza B by PCR: NEGATIVE
Resp Syncytial Virus by PCR: NEGATIVE
SARS Coronavirus 2 by RT PCR: NEGATIVE

## 2023-12-28 LAB — BASIC METABOLIC PANEL
Anion gap: 12 (ref 5–15)
BUN: 10 mg/dL (ref 6–20)
CO2: 28 mmol/L (ref 22–32)
Calcium: 9 mg/dL (ref 8.9–10.3)
Chloride: 96 mmol/L — ABNORMAL LOW (ref 98–111)
Creatinine, Ser: 0.94 mg/dL (ref 0.44–1.00)
GFR, Estimated: >60 mL/min (ref >60)
Glucose, Bld: 149 mg/dL — ABNORMAL HIGH (ref 70–99)
Potassium: 3.4 mmol/L — ABNORMAL LOW (ref 3.5–5.1)
Sodium: 136 mmol/L (ref 135–145)

## 2023-12-28 LAB — TROPONIN I (HIGH SENSITIVITY)
Troponin I (High Sensitivity): 6 ng/L (ref <18)
Troponin I (High Sensitivity): 6 ng/L (ref <18)

## 2023-12-28 LAB — PROTIME-INR
INR: 1.6 — ABNORMAL HIGH (ref 0.8–1.2)
Prothrombin Time: 19.5 s — ABNORMAL HIGH (ref 11.4–15.2)

## 2023-12-28 MED ORDER — POTASSIUM CHLORIDE CRYS ER 20 MEQ PO TBCR
20.0000 meq | EXTENDED_RELEASE_TABLET | Freq: Once | ORAL | Status: AC
  Administered 2023-12-28: 20 meq via ORAL
  Filled 2023-12-28: qty 1

## 2023-12-28 MED ORDER — PREDNISONE 10 MG PO TABS
40.0000 mg | ORAL_TABLET | Freq: Every day | ORAL | 0 refills | Status: AC
Start: 2023-12-28 — End: 2023-12-31

## 2023-12-28 MED ORDER — SODIUM CHLORIDE 0.9 % IV BOLUS
1000.0000 mL | Freq: Once | INTRAVENOUS | Status: AC
  Administered 2023-12-28: 1000 mL via INTRAVENOUS

## 2023-12-28 MED ORDER — BENZONATATE 100 MG PO CAPS: 100.0000 mg | ORAL_CAPSULE | Freq: Three times a day (TID) | ORAL | 0 refills | Status: DC

## 2023-12-28 NOTE — ED Triage Notes (Signed)
Pt arrives via POV. Pt reports productive cough, bilateral ear pain, intermittent sob and intermittent chest pain since Thursday. Pt AxOx4.

## 2023-12-28 NOTE — ED Provider Triage Note (Signed)
Emergency Medicine Provider Triage Evaluation Note  Danielle Obrien , a 43 y.o. female  was evaluated in triage.  Pt complains of left-sided chest pain, productive cough, bilateral ear pain, shortness of breath x 5 days.  Patient reports history of mechanical heart valve, and is on Coumadin.  She went 2 days without taking it because she was getting more bruises on her legs, but she did take it today.  She complains of worsening left sided rib pain, made worse with coughing.  Tried massaging it and using heat without relief.  Notes that several people at work have the flu.   Review of Systems  Positive: Chest pain/left rib pain, cough, shortness of breath, ear pain, diarrhea Negative: Abdominal pain, fever  Physical Exam  BP (!) 149/101 (BP Location: Left Arm)   Pulse 99   Temp (!) 97.5 F (36.4 C) (Oral)   Resp 18   SpO2 100%  Gen:   Awake, no distress   Resp:  Normal effort  MSK:   Moves extremities without difficulty  Other:  Lung sounds clear  Medical Decision Making  Medically screening exam initiated at 9:11 AM.  Appropriate orders placed.  Adalberto Ill was informed that the remainder of the evaluation will be completed by another provider, this initial triage assessment does not replace that evaluation, and the importance of remaining in the ED until their evaluation is complete.  Will obtain basic labs including PT/INR since patient is on Coumadin.  Chest x-ray and EKG ordered.  Respiratory panel ordered.   Su Monks, PA-C 12/28/23 [phone]

## 2023-12-28 NOTE — ED Provider Notes (Signed)
[place] EMERGENCY DEPARTMENT AT [facility] Provider Note   CSN: [id number] Arrival date & time: 12/28/23  4696     History  Chief Complaint  Patient presents with   Chest Pain   Cough   Ear Fullness    Danielle Obrien is a 43 y.o. female.  This is a 43 year old female present emergency department for viral syndrome type complaints with generalized malaise, productive cough, congestion and some intermittent shortness of breath for the past 5 days.  Complains of left-sided chest pain, hurts worse when she coughs.  Described as sharp.  Had fevers at home, those have subsided.  Noted some nonbloody, diarrhea at home. She reports she is feeling overall better after IV fluids.   Chest Pain Associated symptoms: cough   Cough Associated symptoms: chest pain   Ear Fullness Associated symptoms include chest pain.       Home Medications Prior to Admission medications   Medication Sig Start Date End Date Taking? Authorizing Provider  albuterol (VENTOLIN HFA) 108 (90 Base) MCG/ACT inhaler INHALE TWO PUFFS BY MOUTH EVERY 6 HOURS AS NEEDED FOR WHEEZING OR SHORTNESS OF BREATH 11/09/23   Etta Grandchild, MD  Albuterol-Budesonide (AIRSUPRA) 90-80 MCG/ACT AERO Inhale 2 Inhalations into the lungs every 4 (four) hours as needed. 11/10/23   Plotnikov, Georgina Quint, MD  aspirin EC 81 MG tablet Take 81 mg by mouth daily. Swallow whole.    [provider]  cefdinir (OMNICEF) 300 MG capsule Take 1 capsule (300 mg total) by mouth 2 (two) times daily. 11/10/23   Plotnikov, Georgina Quint, MD  DULoxetine (CYMBALTA) 20 MG capsule TAKE 2 CAPSULES BY MOUTH DAILY 12/06/23   Judi Saa, DO  gabapentin (NEURONTIN) 300 MG capsule Take 2 capsules (600 mg total) by mouth 3 (three) times daily. 01/11/23   Judi Saa, DO  HYDROcodone bit-homatropine (HYCODAN) 5-1.5 MG/5ML syrup Take 5 mLs by mouth every 8 (eight) hours as needed for cough. 12/17/23   Etta Grandchild, MD  indapamide (LOZOL)  1.25 MG tablet TAKE 1 TABLET BY MOUTH DAILY 12/07/23   Etta Grandchild, MD  lamoTRIgine (LAMICTAL) 200 MG tablet TAKE 2 TABLETS BY MOUTH DAILY 12/24/23   Etta Grandchild, MD  meclizine (ANTIVERT) 12.5 MG tablet TAKE ONE TABLET BY MOUTH TWICE A DAY AS NEEDED FOR DIZZINESS 09/19/20   Judi Saa, DO  methylPREDNISolone (MEDROL DOSEPAK) 4 MG TBPK tablet As directed Patient not taking: Reported on 12/16/2023 11/10/23   Plotnikov, Georgina Quint, MD  metoprolol tartrate (LOPRESSOR) 25 MG tablet Take 0.5 tablets (12.5 mg total) by mouth daily. Please keep scheduled appointment for future refills. Thank you. 08/05/23   Quintella Reichert, MD  olmesartan (BENICAR) 20 MG tablet TAKE 1 TABLET BY MOUTH DAILY 12/07/23   Etta Grandchild, MD  rosuvastatin (CRESTOR) 20 MG tablet TAKE 1 TABLET BY MOUTH DAILY 12/24/23   Etta Grandchild, MD  tiZANidine (ZANAFLEX) 4 MG tablet TAKE 1 TABLET BY MOUTH AT BEDTIME 04/30/23   Judi Saa, DO  traMADol (ULTRAM) 50 MG tablet TAKE 2 TABLETS BY MOUTH TWICE A DAY 12/15/23   Judi Saa, DO  warfarin (COUMADIN) 7.5 MG tablet TAKE 1 TO 1 AND 1/2 TABLETS BY MOUTH DAILY AS DIRECTED BY ANTICOAGULATION CLINIC 12/13/23   Quintella Reichert, MD      Allergies    Demerol and Ibuprofen    Review of Systems   Review of Systems  Respiratory:  Positive for cough.  Cardiovascular:  Positive for chest pain.    Physical Exam Updated Vital Signs BP (!) 134/92 (BP Location: Right Arm)   Pulse 64   Temp 98 F (36.7 C) (Oral)   Resp 16   SpO2 99%  Physical Exam Vitals and nursing note reviewed.  Constitutional:      Appearance: She is well-developed.  Cardiovascular:     Rate and Rhythm: Normal rate and regular rhythm.     Heart sounds: Normal heart sounds.  Pulmonary:     Breath sounds: Normal breath sounds.  Abdominal:     Palpations: Abdomen is soft.  Musculoskeletal:     Cervical back: Normal range of motion.     Right lower leg: No edema.     Left lower leg: No edema.   Skin:    General: Skin is warm.     Capillary Refill: Capillary refill takes less than 2 seconds.  Neurological:     General: No focal deficit present.     Mental Status: She is alert.  Psychiatric:        Mood and Affect: Mood normal.        Behavior: Behavior normal.     ED Results / Procedures / Treatments   Labs (all labs ordered are listed, but only abnormal results are displayed) Labs Reviewed  RESP PANEL BY RT-PCR (RSV, FLU A&B, COVID)  RVPGX2 - Abnormal; Notable for the following components:      Result Value   Influenza A by PCR POSITIVE (*)    All other components within normal limits  BASIC METABOLIC PANEL - Abnormal; Notable for the following components:   Potassium 3.4 (*)    Chloride 96 (*)    Glucose, Bld 149 (*)    All other components within normal limits  CBC - Abnormal; Notable for the following components:   RBC 5.49 (*)    Hemoglobin 17.1 (*)    HCT 49.6 (*)    Platelets 80 (*)    All other components within normal limits  PROTIME-INR - Abnormal; Notable for the following components:   Prothrombin Time 19.5 (*)    INR 1.6 (*)    All other components within normal limits  D-DIMER, QUANTITATIVE  TROPONIN I (HIGH SENSITIVITY)  TROPONIN I (HIGH SENSITIVITY)    EKG EKG Interpretation Date/Time:  Tuesday December 28 2023 08:46:35 EST Ventricular Rate:  95 PR Interval:  136 QRS Duration:  86 QT Interval:  412 QTC Calculation: 517 R Axis:   108  Text Interpretation: Normal sinus rhythm Right atrial enlargement Rightward axis Minimal voltage criteria for LVH, may be normal variant ( Cornell product ) Anterior infarct , age undetermined Prolonged QT Abnormal ECG When compared with ECG of 07-Jul-2023 20:43, PREVIOUS ECG IS PRESENT Confirmed by Estanislado Pandy [id number]) on 12/28/2023 3:35:26 PM  Radiology DG Chest 2 View Result Date: 12/28/2023 CLINICAL DATA:  Chest pain. EXAM: CHEST - 2 VIEW COMPARISON:  December 16, 2023. FINDINGS: The heart size and  mediastinal contours are within normal limits. Status post cardiac valve repair. Both lungs are clear. The visualized skeletal structures are unremarkable. IMPRESSION: No active cardiopulmonary disease. Electronically Signed   By: Lupita Raider M.D.   On: 12/28/2023 10:26    Procedures Procedures    Medications Ordered in ED Medications  sodium chloride 0.9 % bolus 1,000 mL (1,000 mLs Intravenous New Bag/Given 12/28/23 1629)  potassium chloride SA (KLOR-CON M) CR tablet 20 mEq (20 mEq Oral Given 12/28/23 1629)  ED Course/ Medical Decision Making/ A&P Clinical Course as of 12/28/23 1815  Tue Dec 28, 2023  1534 ED EKG EKG on my independent interpretation appears to be normal sinus rhythm at a rate of 95 bpm, normal intervals.  Slight QTc prolongation.  LVH type pattern.  No ST segment changes to indicate ischemia. 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  [TY]  1546 Per chart review called PCP office with complaints of "Red Word that prompted transfer to Nurse Triage: throat burns, shortness of breath, chest pain, muscles are on fire, tinnitus not able to eat for 5 days, diarrhea, everything smells and tastes awful. no fever present, fever broke last week. patient thought it was the flu and is has not gotten better. she is in severe pain and can barely move, no energy, patient is on blood thinners, and legs are covered in bruises and states she has not fell. She is having difficulty speaking because she is having a very hard time breathing." [TY]  1700 MCH: 31.1 [TY]  1810 Resp panel by RT-PCR (RSV, Flu A&B, Covid) Anterior Nasal Swab(!) [TY]  1811 Influenza A By PCR(!): POSITIVE [TY]    Clinical Course User Index [TY] Coral Spikes, DO                                 Medical Decision Making This is a 43 year old female with past medical history that includes aortic valve replacement, deafness presenting emergency department with  viral symptoms type complaints.  She is on Coumadin and follows with cardiology per chart review.  Noted to triage provider that she has not taken her Coumadin 2 days as she is felt unwell.  Patient her workup today largely reassuring.  Troponin negative, EKG without ST segment changes indicate ischemia.  History not consistent with ACS.  Low risk for PE.  D-dimer negative.  Some mild signs of dehydration on base metabolic panel, received IV fluids.  No fever or tachycardia or leukocytosis to suggest systemic infection.  Given her constellation of symptoms concern for viral etiology.  Flu/COVID/RSV positive.  Chest x-ray without pneumonia pneumothorax.  Given duration of symptoms not a candidate for Tamiflu.  She has been using her inhaler more, mild wheeze on exam.  Will discharge with short course of steroids.  Discussed supportive care.  Stable for discharge at this time.  Amount and/or Complexity of Data Reviewed External Data Reviewed:     Details: Saw oncology 10/07/23 and per their note :"1. Thrombocytopenia (HCC) Assessment & Plan: We discussed common etiology of erythrocytosis (PV and secondary polycythemia), and thrombocytopenia, such as ITP, chronic hepatitis, liver disease, splenomegaly, medication induced, B12 deficiency, folate deficiency, and primary pulmonary disease.  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   -lab check today includes immature platelet count, B12, folate, erythropoietin level, and MPN panel.  -prior labs were negative for HIV and hepatitis C.  Will call her with lab results.   -She is on Coumadin, so risk of thrombosis from erythrocytosis is low.  We discussed the risks of bleeding from thrombocytopenia since she is on Coumadin.She is followed by cardiology for INR monitoring and management of coumadin.  -if her platelet count is persistently less than 50K, she may require treatment such as platelet transfusion in the  future. " Labs: ordered. Decision-making details documented in ED Course. Radiology: ordered. Decision-making details  documented in ED Course. ECG/medicine tests:  Decision-making details documented in ED Course.  Risk Prescription drug management. Decision regarding hospitalization. Diagnosis or treatment significantly limited by social determinants of health. Risk Details: Poor health literacy.          Final Clinical Impression(s) / ED Diagnoses Final diagnoses:  None    Rx / DC Orders ED Discharge Orders     None         Coral Spikes, DO 12/28/23 1815

## 2023-12-28 NOTE — Discharge Instructions (Addendum)
You have the flu.  As discussed care is supportive.  Take Tylenol for pain.  We have requiring you steroids.  Please take them as well as your albuterol.  Please return if you develop worsening chest pain, shortness of breath, passout, worsening chest pain, abdominal pain, inability to eat or drink due to nausea vomiting or you develop any new or worsening symptoms that are concerning to you.  Please follow-up with your primary doctor and your cardiologist.

## 2023-12-30 ENCOUNTER — Ambulatory Visit: Payer: Managed Care, Other (non HMO) | Admitting: Gynecologic Oncology

## 2023-12-30 ENCOUNTER — Ambulatory Visit: Payer: Managed Care, Other (non HMO)

## 2024-01-06 ENCOUNTER — Encounter: Payer: Self-pay | Admitting: Cardiology

## 2024-01-06 ENCOUNTER — Ambulatory Visit: Payer: Managed Care, Other (non HMO) | Attending: Cardiology | Admitting: Cardiology

## 2024-01-06 ENCOUNTER — Other Ambulatory Visit: Payer: Self-pay | Admitting: Cardiology

## 2024-01-06 VITALS — BP 114/70 | HR 80 | Ht 60.0 in | Wt 110.0 lb

## 2024-01-06 DIAGNOSIS — Q230 Congenital stenosis of aortic valve: Secondary | ICD-10-CM | POA: Diagnosis not present

## 2024-01-06 DIAGNOSIS — Z954 Presence of other heart-valve replacement: Secondary | ICD-10-CM

## 2024-01-06 DIAGNOSIS — I10 Essential (primary) hypertension: Secondary | ICD-10-CM

## 2024-01-06 DIAGNOSIS — Q2381 Bicuspid aortic valve: Secondary | ICD-10-CM | POA: Diagnosis not present

## 2024-01-06 DIAGNOSIS — Z7901 Long term (current) use of anticoagulants: Secondary | ICD-10-CM

## 2024-01-06 DIAGNOSIS — R079 Chest pain, unspecified: Secondary | ICD-10-CM

## 2024-01-06 NOTE — Addendum Note (Signed)
 Addended by: Thaddeus Filippo on: 01/06/2024 08:59 AM   Modules accepted: Orders

## 2024-01-06 NOTE — Progress Notes (Signed)
 Cardiology Office Note:    Date:  01/06/2024   ID:  Danielle Obrien, DOB 01-20-1981, MRN [id number]  PCP:  Joshua Debby LITTIE, MD  Cardiologist:  Wilbert Bihari, MD    Referring MD: Joshua Debby LITTIE, MD   Chief Complaint  Patient presents with   Aortic Stenosis   Hypertension    History of Present Illness:    Danielle Obrien is a 43 y.o. female with a hx of bicuspid AV with severe AS, bipolar disorder, asthma and arthritis.  She underwent minimally invasive AVR with a 23mm Sorin Carbomedics Top Hat bileaflet mechanical valve via right anterior mini thoracotomy on 11/03/2017 by Dr. Dusty after coronary CT showed no CAD.    She is here today for followup.  Unfortunately she got pneumonia recently and then developed Influenza A and was seen in the ER with CP, cough and ear fullness a week ago and dx with influenza A. EKG showed prior anterior infarct. Trop was negative. Ddimer was negative. Started on steroids and inhalers.   Today she is still having some chest pain that feels like a squeezing sensation worse with laying down on left side and wheezes at the same time.  She is still SOB some and still coughing.   Before she had the Pneumonia she was not having any CP or SOB.  She denies any PND, orthopnea, LE edema, dizziness, palpitations or syncope. She is compliant with her meds and is tolerating meds with no SE.    Past Medical History:  Diagnosis Date   ADD (attention deficit disorder)    Alcohol abuse, in remission 2012   per pt in remission since 2012   Anticoagulated on Coumadin     coumadin --- managed by cardiology/ coumadin  clinic   Arthritis    In hips   Asthma    allergy induced, rare inhaler use   Auditory processing disorder    per pt very HOH, first language ASL   Bipolar disorder (HCC)    Chronic left hip pain    Chronic vertigo    Complication of anesthesia    per pt due to auditory processing disorder pt is unable to hear when waking up and also gets very anxious    Family history of uterine cancer    GAD (generalized anxiety disorder)    GERD (gastroesophageal reflux disease)    History of cervical dysplasia    History of condyloma acuminatum 2017   s/p laser ablation vulva   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   biopsy and CO2 laser ablation   HOH (hard of hearing)    per pt very HOH due to auditroy processing disorder   Hyperlipidemia    Hypertension    Muscle spasms of both lower extremities    both hips   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   23 mm Sorin Carbomedics Top Hat bileaflet mechanical valve via right anterior mini thoracotomy   Vulvar dysplasia     Past Surgical History:  Procedure Laterality Date   ANTERIOR CRUCIATE LIGAMENT REPAIR  1993   AORTIC VALVE REPLACEMENT N/A 11/03/2017   Procedure: MINIMALLY INVASIVE AORTIC VALVE REPLACEMENT( MINI THORACOTOMY);  Surgeon: Dusty Sudie DEL, MD;  Location: [facility] OR;  Service: Open Heart Surgery;  Laterality: N/A;   CERVICAL BIOPSY  W/ LOOP ELECTRODE EXCISION  2010   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 ECC   COLPOSCOPY  06/30/2001   CIN I   COLPOSCOPY  08/30/2004   ECC--atypia   COLPOSCOPY N/A 01/21/2016   Procedure: COLPOSCOPY with vaginal biopsy with CO 2 Laser of Vaginal and vulvar condyloma;  Surgeon: Bobie FORBES Cathlyn JAYSON Nikki, MD;  Location: [facility] ORS;  Service: Gynecology;  Laterality: N/A;  [name] will be here 2/21 for  1115 case confirmed 01/16/15 - TS   HARDWARE REMOVAL Left 1992   Left hip   INGUINAL HERNIA REPAIR Bilateral    1981;  [id number]   LEFT AND RIGHT HEART CATHETERIZATION WITH CORONARY ANGIOGRAM N/A 05/18/2014   Procedure: LEFT AND RIGHT HEART CATHETERIZATION WITH CORONARY ANGIOGRAM;  Surgeon: Candyce GORMAN Reek, MD;  Location: [facility] CATH LAB;  Service: Cardiovascular;  Laterality: N/A;   LESION REMOVAL Right 05/12/2022   Procedure: ANTONETTE OF RIGHT CHEEK CYST;  Surgeon: Elisabeth Craig GORMAN, MD;  Location: [facility];  Service: Plastics;  Laterality: Right;   LYMPH NODE BIOPSY  1995   NASAL SEPTUM SURGERY  2002   ORIF HIP FRACTURE Left 1990   per pt and  took out growth plate in Right knee   ROBOTIC ASSISTED TOTAL HYSTERECTOMY  02/26/2009   @[facility]    TEE WITHOUT CARDIOVERSION N/A 11/03/2017   Procedure: TRANSESOPHAGEAL ECHOCARDIOGRAM (TEE);  Surgeon: Dusty Sudie DEL, MD;  Location[facility] OR;  Service: Open Heart Surgery;  Laterality: N/A;   TONSILLECTOMY AND ADENOIDECTOMY  1990   TYMPANOSTOMY TUBE PLACEMENT Bilateral    x3  per pt last one 1982   VULVA /PERINEUM BIOPSY N/A 05/12/2022   Procedure: POSSIBLE VULVAR BIOPSY; POSSIBLE VAGINAL BIOPSY;  Surgeon: Viktoria Comer SAUNDERS, MD;  Location: [facility];  Service: Gynecology;  Laterality: N/A;   VULVECTOMY N/A 05/12/2022   Procedure: WIDE EXCISION VULVECTOMY;  Surgeon: Viktoria Comer SAUNDERS, MD;  Location: [facility];  Service: Gynecology;  Laterality: N/A;   VULVECTOMY N/A 08/12/2022   Procedure: WIDE LOCAL EXCISION VULVECTOMY;  Surgeon: Viktoria Comer SAUNDERS, MD;  Location: [facility];  Service: Gynecology;  Laterality: N/A;    Current Medications: Current Meds  Medication Sig   albuterol  (VENTOLIN  HFA) 108 (90 Base) MCG/ACT inhaler INHALE TWO PUFFS BY MOUTH EVERY 6 HOURS AS NEEDED FOR WHEEZING OR SHORTNESS OF BREATH   Albuterol -Budesonide  (AIRSUPRA ) 90-80 MCG/ACT AERO Inhale 2 Inhalations into the  lungs every 4 (four) hours as needed.   aspirin  EC 81 MG tablet Take 81 mg by mouth daily. Swallow whole.   benzonatate  (TESSALON ) 100 MG capsule Take 1 capsule (100 mg total) by mouth every 8 (eight) hours.   DULoxetine  (CYMBALTA ) 20 MG capsule TAKE 2 CAPSULES BY MOUTH DAILY   gabapentin  (NEURONTIN ) 300 MG capsule Take 2 capsules (600 mg total) by mouth 3 (three) times daily.   lamoTRIgine  (LAMICTAL ) 200 MG tablet TAKE 2 TABLETS BY MOUTH DAILY   meclizine  (ANTIVERT ) 12.5 MG tablet TAKE ONE TABLET BY MOUTH TWICE A DAY AS NEEDED FOR DIZZINESS   metoprolol  tartrate (LOPRESSOR ) 25 MG tablet Take 0.5 tablets (12.5 mg total) by mouth daily.  Please keep scheduled appointment for future refills. Thank you.   rosuvastatin  (CRESTOR ) 20 MG tablet TAKE 1 TABLET BY MOUTH DAILY   tiZANidine  (ZANAFLEX ) 4 MG tablet TAKE 1 TABLET BY MOUTH AT BEDTIME   traMADol  (ULTRAM ) 50 MG tablet TAKE 2 TABLETS BY MOUTH TWICE A DAY   warfarin (COUMADIN ) 7.5 MG tablet TAKE ONE TO ONE AND ONE-HALF (1 TO 1.5) TABLETS BY MOUTH DAILY AS DIRECTED BY ANTICOAGULATION CLINIC     Allergies:   Demerol  and Ibuprofen   Social History   Socioeconomic History   Marital status: Single    Spouse name: Not on file   Number of children: Not on file   Years of education: Not on file   Highest education level: Not on file  Occupational History   Not on file  Tobacco Use   Smoking status: Every Day    Current packs/day: 0.25    Average packs/day: 0.3 packs/day for 20.0 years (5.0 ttl pk-yrs)    Types: Cigarettes   Smokeless tobacco: Never  Vaping Use   Vaping status: Never Used  Substance and Sexual Activity   Alcohol use: No    Comment: in remission since 2012   Drug use: Yes    Types: Marijuana    Comment: 08-11-22 last, per pt smokes on average 1/4oz per week   Sexual activity: Yes    Partners: Male    Birth control/protection: Surgical    Comment: Hysterectomy  Other Topics Concern   Not on file  Social History  Narrative   Not on file   Social Drivers of Health   Financial Resource Strain: Low Risk  (12/10/2018)   Overall Financial Resource Strain (CARDIA)    Difficulty of Paying Living Expenses: Not hard at all  Food Insecurity: No Food Insecurity (07/07/2023)   Hunger Vital Sign    Worried About Running Out of Food in the Last Year: Never true    Ran Out of Food in the Last Year: Never true  Transportation Needs: No Transportation Needs (07/07/2023)   PRAPARE - Administrator, Civil Service (Medical): No    Lack of Transportation (Non-Medical): No  Physical Activity: Unknown (12/10/2018)   Exercise Vital Sign    Days of Exercise per Week: Patient declined    Minutes of Exercise per Session: Patient declined  Stress: Not on file  Social Connections: Unknown (12/10/2018)   Social Connection and Isolation Panel [NHANES]    Frequency of Communication with Friends and Family: Patient declined    Frequency of Social Gatherings with Friends and Family: Patient declined    Attends Religious Services: Patient declined    Database Administrator or Organizations: Patient declined    Attends Engineer, Structural: Patient declined    Marital Status: Patient declined     Family History: The patient's family history includes Colon cancer in her paternal great-grandmother; Dementia in her maternal grandmother and paternal grandmother; Heart attack in her paternal grandfather and paternal uncle; Hyperlipidemia in her brother, father, and mother; Hypertension in her brother, father, and mother; Migraines in her father; Other in her mother; Prostate cancer (age of onset: 39) in her maternal grandfather; Skin cancer in her father; Thyroid  disease in her maternal grandfather, maternal grandmother, and mother; Uterine cancer (age of onset: 10) in her maternal grandmother.  ROS:   Please see the history of present illness.    ROS  All other systems reviewed and negative.   EKGs/Labs/Other  Studies Reviewed:  The following studies were reviewed today:   Recent Labs: 07/14/2023: Magnesium  2.3 09/09/2023: ALT 16; TSH 0.68 12/28/2023: BUN 10; Creatinine, Ser 0.94; Hemoglobin 17.1; Platelets 80; Potassium 3.4; Sodium 136   Recent Lipid Panel    Component Value Date/Time   CHOL 195 02/11/2023 1613   TRIG 67.0 02/11/2023 1613   HDL 77.90 02/11/2023 1613   CHOLHDL 3 02/11/2023 1613   VLDL 13.4 02/11/2023 1613   LDLCALC 104 (H) 02/11/2023 1613   LDLDIRECT 171.0 01/27/2013 1425    Physical Exam:    VS:  BP 114/70 (BP Location: Left Arm, Patient Position: Sitting, Cuff Size: Normal)   Pulse 80   Ht 5' (1.524 m)   Wt 110 lb (49.9 kg)   SpO2 96%   BMI 21.48 kg/m     Wt Readings from Last 3 Encounters:  01/06/24 110 lb (49.9 kg)  12/16/23 108 lb 3.2 oz (49.1 kg)  11/10/23 113 lb 6.4 oz (51.4 kg)    GEN: Well nourished, well developed in no acute distress HEENT: Normal NECK: No JVD; No carotid bruits LYMPHATICS: No lymphadenopathy CARDIAC:RRR, no murmurs, rubs, gallops RESPIRATORY:  diffuse inspiratory and expiratory wheezes and rhonchi ABDOMEN: Soft, non-tender, non-distended MUSCULOSKELETAL:  No edema; No deformity  SKIN: Warm and dry NEUROLOGIC:  Alert and oriented x 3 PSYCHIATRIC:  Normal affect  ASSESSMENT:    1. Aortic stenosis due to bicuspid aortic valve   2. Essential hypertension, benign      PLAN:    In order of problems listed above:  1.  Severe AS -s/p minimally invasive AVR with a 23mm Sorin Carbomedics Top Hat bileaflet mechanical valve via right anterior mini thoracotomy on 11/03/2017 -She denies any bleeding problems on warfarin -Continue drug management with aspirin  81 mg daily and warfarin with as needed refills -discussed SBE prophylaxis for dental procedures -2D echo 02/16/2022 showed EF 60 to 65% with mild MR, stable AVR with trivial perivalvular AI and mean aortic valve gradient 7.5 mm  2.  HTN -BP is adequate controlled on exam  today -Continue prescription drug management with indapamide  1.25 mg daily and Lopressor  12 5 mg twice daily, Benicar  20 mg daily with as needed refills -I have personally reviewed and interpreted outside labs performed by patient's PCP which showed serum creatinine 0.94 and potassium 3.4 on 12/28/2023  3.  Atypical CP -occurred in the setting of Pneumonia and Influenza -no pain prior to getting sick -Trop in ER neg -worse with laying down so will get a echo to rule out pericardial effusion  4.  Thrombocytopenia -this is chronic and followed by Hematology -Plt count prior to discharge from hospital was 80K -repeat CBC today  Medication Adjustments/Labs and Tests Ordered: Current medicines are reviewed at length with the patient today.  Concerns regarding medicines are outlined above.  No orders of the defined types were placed in this encounter.  No orders of the defined types were placed in this encounter.  Followup with me in 1 year  Signed, Wilbert Bihari, MD  01/06/2024 8:40 AM     [facility]

## 2024-01-06 NOTE — Telephone Encounter (Signed)
 Prescription refill request received for warfarin Lov: 08/05/23 [place])  Next INR check: 12/30/23 Warfarin tablet strength: 7.5mg   Coumadin  Clinic appt overdue. Called pt to schedule, no answer. Left message on voicemail. 30 day supply sent to phar,acy to prevent missed doses.

## 2024-01-06 NOTE — Patient Instructions (Signed)
 Medication Instructions:  The current medical regimen is effective;  continue present plan and medications.  *If you need a refill on your cardiac medications before your next appointment, please call your pharmacy*  Lab Work: Please have blood work today (CBC) at [facility] on the 1st floor.  If you have labs (blood work) drawn today and your tests are completely normal, you will receive your results only by: MyChart Message (if you have MyChart) OR A paper copy in the mail If you have any lab test that is abnormal or we need to change your treatment, we will call you to review the results.  Testing/Procedures: Your physician has requested that you have an echocardiogram. Echocardiography is a painless test that uses sound waves to create images of your heart. It provides your doctor with information about the size and shape of your heart and how well your heart's chambers and valves are working. This procedure takes approximately one hour. There are no restrictions for this procedure. Please do NOT wear cologne, perfume, aftershave, or lotions (deodorant is allowed). Please arrive 15 minutes prior to your appointment time.  Please note: We ask at that you not bring children with you during ultrasound (echo/ vascular) testing. Due to room size and safety concerns, children are not allowed in the ultrasound rooms during exams. Our front office staff cannot provide observation of children in our lobby area while testing is being conducted. An adult accompanying a patient to their appointment will only be allowed in the ultrasound room at the discretion of the ultrasound technician under special circumstances. We apologize for any inconvenience.  Follow-Up: At [facility], you and your health needs are our priority.  As part of our continuing mission to provide you with exceptional heart care, we have created designated Provider Care Teams.  These Care Teams include your primary  Cardiologist (physician) and Advanced Practice Providers (APPs -  Physician Assistants and Nurse Practitioners) who all work together to provide you with the care you need, when you need it.  We recommend signing up for the patient portal called MyChart.  Sign up information is provided on this After Visit Summary.  MyChart is used to connect with patients for Virtual Visits (Telemedicine).  Patients are able to view lab/test results, encounter notes, upcoming appointments, etc.  Non-urgent messages can be sent to your provider as well.   To learn more about what you can do with MyChart, go to [email or web address].    Your next appointment:   1 year(s)  Provider:   Wilbert Bihari, MD

## 2024-01-07 ENCOUNTER — Telehealth: Payer: Self-pay

## 2024-01-07 ENCOUNTER — Ambulatory Visit: Payer: Managed Care, Other (non HMO) | Admitting: Family Medicine

## 2024-01-07 LAB — CBC
Hematocrit: 39.3 % (ref 34.0–46.6)
Hemoglobin: 12.9 g/dL (ref 11.1–15.9)
MCH: 31 pg (ref 26.6–33.0)
MCHC: 32.8 g/dL (ref 31.5–35.7)
MCV: 95 fL (ref 79–97)
Platelets: 245 10*3/uL (ref 150–450)
RBC: 4.16 x10E6/uL (ref 3.77–5.28)
RDW: 12.7 % (ref 11.7–15.4)
WBC: 6.8 10*3/uL (ref 3.4–10.8)

## 2024-01-07 NOTE — Telephone Encounter (Signed)
-----   Message from Gaylyn Keas sent at 01/07/2024  8:10 AM EST ----- Please let patient know that labs were normal.  Continue current medical therapy.

## 2024-01-07 NOTE — Telephone Encounter (Signed)
 Call to discuss lab work results, no answer. Left detailed message per DPR explaining labs were normal and Dr. Micael Adas advises to continue current meds. Asked patient to call our office if any questions.

## 2024-01-10 ENCOUNTER — Other Ambulatory Visit: Payer: Self-pay | Admitting: Family Medicine

## 2024-01-11 ENCOUNTER — Ambulatory Visit (INDEPENDENT_AMBULATORY_CARE_PROVIDER_SITE_OTHER): Payer: Managed Care, Other (non HMO)

## 2024-01-11 ENCOUNTER — Encounter: Payer: Self-pay | Admitting: Family Medicine

## 2024-01-11 ENCOUNTER — Ambulatory Visit: Payer: Managed Care, Other (non HMO) | Admitting: Family Medicine

## 2024-01-11 VITALS — BP 112/74 | HR 70 | Temp 97.6°F | Ht 60.0 in | Wt 106.0 lb

## 2024-01-11 DIAGNOSIS — R0602 Shortness of breath: Secondary | ICD-10-CM

## 2024-01-11 DIAGNOSIS — R062 Wheezing: Secondary | ICD-10-CM

## 2024-01-11 DIAGNOSIS — Z8701 Personal history of pneumonia (recurrent): Secondary | ICD-10-CM | POA: Diagnosis not present

## 2024-01-11 DIAGNOSIS — J189 Pneumonia, unspecified organism: Secondary | ICD-10-CM

## 2024-01-11 DIAGNOSIS — Z720 Tobacco use: Secondary | ICD-10-CM

## 2024-01-11 DIAGNOSIS — Z8709 Personal history of other diseases of the respiratory system: Secondary | ICD-10-CM

## 2024-01-11 DIAGNOSIS — J029 Acute pharyngitis, unspecified: Secondary | ICD-10-CM

## 2024-01-11 DIAGNOSIS — H9203 Otalgia, bilateral: Secondary | ICD-10-CM

## 2024-01-11 DIAGNOSIS — L989 Disorder of the skin and subcutaneous tissue, unspecified: Secondary | ICD-10-CM

## 2024-01-11 DIAGNOSIS — Z7901 Long term (current) use of anticoagulants: Secondary | ICD-10-CM

## 2024-01-11 MED ORDER — PREDNISONE 20 MG PO TABS: 40.0000 mg | ORAL_TABLET | Freq: Every day | ORAL | 0 refills | Status: DC

## 2024-01-11 MED ORDER — BENZONATATE 100 MG PO CAPS: 100.0000 mg | ORAL_CAPSULE | Freq: Three times a day (TID) | ORAL | 0 refills | Status: DC

## 2024-01-11 MED ORDER — HYDROCODONE BIT-HOMATROP MBR 5-1.5 MG/5ML PO SOLN: 5.0000 mL | Freq: Three times a day (TID) | ORAL | 0 refills | Status: DC | PRN

## 2024-01-11 NOTE — Patient Instructions (Signed)
Please go downstairs for an x-ray before you leave.  Continue using your inhalers.   I refilled Tessalon and Hycodan cough medications.  Take the oral steroids as prescribed with food and water. I will be in touch with your x-ray results okay and prescribe an antibiotic if needed.  Continue working on stopping smoking.  Let us know if you need a referral for the lesion on your face.

## 2024-01-11 NOTE — Progress Notes (Signed)
Subjective:  Danielle Obrien is a 43 y.o. female who presents for cough, ST, shortness of breath, ears are popping and feeling clogged, dizziness that feels like vertigo with hx of same.  States she wheezes and coughs and it is worse when laying on her left side.   She was seen in the ED on 12/28/2023 for flu and treated with steroids and Tessalon.  Tessalon perles are helpful and requests a refill of these and Hycodan cough syrup.  She is on warfarin.   Recent worsening of thrombocytopenia in ED with resolution at cardiologist visit last week.   She saw Dr. Mayford Knife, cardiologist, and has an echocardiogram scheduled.    ROS as in subjective.   Objective: Vitals:   01/11/24 0959  BP: 112/74  Pulse: 70  Temp: 97.6 F (36.4 C)  SpO2: 98%    General appearance: Alert, WD/WN, no distress, mildly ill appearing                             Skin: warm, no rash. 1 cm round raised papule on right cheek with scabbing                           Head: no sinus tenderness                            Eyes: conjunctiva normal, corneas clear, PERRLA                            Ears: pearly TMs, external ear canals normal                          Nose: septum midline, turbinates swollen, with erythema and clear discharge             Mouth/throat: MMM, tongue normal, mild pharyngeal erythema                           Neck: supple, no adenopathy, no thyromegaly, nontender                          Heart: RRR                         Lungs: +wheezing and scattered rhonchi      Assessment: Shortness of breath - Plan: DG Chest 2 View  Tobacco abuse - Plan: DG Chest 2 View  History of pneumonia  History of influenza  Lesion of face  Wheezing - Plan: DG Chest 2 View, predniSONE (DELTASONE) 20 MG tablet  Otalgia of both ears  Acute pharyngitis, unspecified etiology  Pneumonia of left lower lobe due to infectious organism - Plan: HYDROcodone bit-homatropine (HYCODAN) 5-1.5 MG/5ML syrup, DG  Chest 2 View  On continuous oral anticoagulation   Plan: Visit done with ASL interpreter in person.  Reviewed recent notes from ED and cardiology.  STAT chest X ray ordered.  Oral steroids prescribed. Refilled Hycodan and Tessalon Hold antibiotic therapy pending chest X ray result.  She will reach out to her plastic surgeon regarding lesion on face.

## 2024-01-11 NOTE — Progress Notes (Signed)
Her chest X ray is negative for pneumonia or anything acute. Her lungs are clear appearing. No change needed to the treatment plan at this time. Please ask her to follow up if she is getting worse or not improving in the next 2-3 days.

## 2024-01-12 NOTE — Progress Notes (Unsigned)
[facility] [address] [place] [address] Phone: [phone] Subjective:   Morene Antu am a scribe for Dr. Katrinka Blazing.   I'm seeing this patient by the request  of:  Etta Grandchild, MD  CC: low back exam shows   [id number]  CHEYANN BLECHA is a 43 y.o. female coming in with complaint of polyarthralgia. Last seen one year ago. Patient states getting there. Been feeling other pain but still having popping in the left knee. Feels it more in the front of the knee. After popping it feels ok.   Since we have seen patient patient did have also gallbladder removal in August of last year.  He has been seen by cardiology for the aortic stenosis.  Due to her cardiac history unable to do the anti-inflammatory medications.  Tramadol does help her with the pain though.  Continues to take 100 mg twice daily  Past Medical History:  Diagnosis Date   ADD (attention deficit disorder)    Alcohol abuse, in remission 2012   per pt in remission since 2012   Anticoagulated on Coumadin    coumadin--- managed by cardiology/ coumadin clinic   Arthritis    In hips   Asthma    allergy induced, rare inhaler use   Auditory processing disorder    per pt very HOH, first language ASL   Bipolar disorder (HCC)    Chronic left hip pain    Chronic vertigo    Complication of anesthesia    per pt due to auditory processing disorder pt is unable to hear when waking up and also gets very anxious   Family history of uterine cancer    GAD (generalized anxiety disorder)    GERD (gastroesophageal reflux disease)    History of cervical dysplasia    History of condyloma acuminatum 2017   s/p laser ablation vulva   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   biopsy and CO2 laser ablation   HOH (hard of hearing)    per pt very HOH due to auditroy processing disorder   Hyperlipidemia    Hypertension    Muscle spasms of both lower extremities    both hips   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   23 mm Sorin [employer] [place] bileaflet mechanical valve via right anterior mini thoracotomy   Vulvar dysplasia    Past Surgical History:  Procedure Laterality Date   ANTERIOR CRUCIATE LIGAMENT REPAIR  1993   AORTIC VALVE REPLACEMENT N/A 11/03/2017   Procedure: MINIMALLY INVASIVE AORTIC VALVE REPLACEMENT( MINI THORACOTOMY);  Surgeon: Purcell Nails, MD;  Location: [facility] OR;  Service: Open Heart Surgery;  Laterality: N/A;   CERVICAL BIOPSY  W/ LOOP ELECTRODE EXCISION  2010   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 ECC   COLPOSCOPY  06/30/2001   CIN I   COLPOSCOPY  08/30/2004   ECC--atypia   COLPOSCOPY N/A 01/21/2016   Procedure:  COLPOSCOPY with vaginal biopsy with CO 2 Laser of Vaginal and vulvar condyloma;  Surgeon: Patton Salles, MD;  Location: [facility] ORS;  Service: Gynecology;  Laterality: N/A;  [name] will be here 2/21 for 1115 case confirmed 01/16/15 - TS   HARDWARE REMOVAL Left 1992   Left hip   INGUINAL HERNIA REPAIR Bilateral    1981;  [id number]   LEFT AND RIGHT HEART CATHETERIZATION WITH CORONARY ANGIOGRAM N/A 05/18/2014   Procedure: LEFT AND RIGHT HEART CATHETERIZATION WITH CORONARY ANGIOGRAM;  Surgeon: [name] Crafts, MD;  Location: [facility] CATH LAB;  Service: Cardiovascular;  Laterality: N/A;   LESION REMOVAL Right 05/12/2022   Procedure: Sharen Hint OF RIGHT CHEEK CYST;  Surgeon: Allena Napoleon,  MD;  Location: [facility];  Service: Plastics;  Laterality: Right;   LYMPH NODE BIOPSY  1995   NASAL SEPTUM SURGERY  2002   ORIF HIP FRACTURE Left 1990   per pt and  took out growth plate in Right knee   ROBOTIC ASSISTED TOTAL HYSTERECTOMY  02/26/2009   @[facility]    TEE WITHOUT CARDIOVERSION N/A 11/03/2017   Procedure: TRANSESOPHAGEAL ECHOCARDIOGRAM (TEE);  Surgeon: Purcell Nails, MD;  Location: [facility] OR;  Service: Open Heart Surgery;  Laterality: N/A;   TONSILLECTOMY AND ADENOIDECTOMY  1990   TYMPANOSTOMY TUBE PLACEMENT Bilateral    x3  per pt last one 1982   VULVA /PERINEUM BIOPSY N/A 05/12/2022   Procedure: POSSIBLE VULVAR BIOPSY; POSSIBLE VAGINAL BIOPSY;  Surgeon: Carver Fila, MD;  Location: [facility];  Service: Gynecology;  Laterality: N/A;   VULVECTOMY N/A 05/12/2022   Procedure: WIDE EXCISION VULVECTOMY;  Surgeon: Carver Fila, MD;  Location: [facility];  Service: Gynecology;  Laterality: N/A;   VULVECTOMY N/A 08/12/2022   Procedure: WIDE LOCAL EXCISION VULVECTOMY;  Surgeon: Carver Fila, MD;  Location: [facility];  Service: Gynecology;  Laterality: N/A;   Social History   Socioeconomic History   Marital status: Single    Spouse name: Not on file   Number of children: Not on file   Years of education: Not on file   Highest education level: Not on file  Occupational History   Not on file  Tobacco Use   Smoking status: Every Day    Current packs/day: 0.25    Average packs/day: 0.3 packs/day for 20.0 years (5.0 ttl pk-yrs)    Types: Cigarettes   Smokeless tobacco: Never  Vaping Use   Vaping status: Never Used  Substance and Sexual Activity   Alcohol use: No    Comment: in remission since 2012   Drug use: Yes    Types: Marijuana    Comment: 08-11-22 last, per pt smokes on average 1/4oz per week   Sexual activity: Yes    Partners: Male    Birth control/protection: Surgical    Comment:  Hysterectomy  Other Topics Concern   Not on file  Social History Narrative   Not on file   Social Drivers of Health   Financial Resource Strain: Low Risk  (12/10/2018)   Overall Financial Resource Strain (CARDIA)    Difficulty of Paying Living Expenses: Not hard at all  Food Insecurity: No Food Insecurity (07/07/2023)   Hunger Vital Sign    Worried About Running Out of Food in the Last Year: Never true    Ran Out of Food in the Last Year: Never true  Transportation Needs: No Transportation Needs (07/07/2023)   PRAPARE - Transportation  Lack of Transportation (Medical): No    Lack of Transportation (Non-Medical): No  Physical Activity: Unknown (12/10/2018)   Exercise Vital Sign    Days of Exercise per Week: Patient declined    Minutes of Exercise per Session: Patient declined  Stress: Not on file  Social Connections: Unknown (12/10/2018)   Social Connection and Isolation Panel [NHANES]    Frequency of Communication with Friends and Family: Patient declined    Frequency of Social Gatherings with Friends and Family: Patient declined    Attends Religious Services: Patient declined    Database administrator or Organizations: Patient declined    Attends Engineer, structural: Patient declined    Marital Status: Patient declined   Allergies  Allergen Reactions   Demerol Nausea And Vomiting   Ibuprofen Other (See Comments)    Gastritis    Family History  Problem Relation Age of Onset   Hyperlipidemia Mother    Hypertension Mother    Thyroid disease Mother    Other Mother        Pituitary tumor   Hyperlipidemia Father    Hypertension Father    Migraines Father    Skin cancer Father    Hypertension Brother    Hyperlipidemia Brother    Heart attack Paternal Uncle    Thyroid disease Maternal Grandmother    Uterine cancer Maternal Grandmother 47   Dementia Maternal Grandmother    Thyroid disease Maternal Grandfather    Prostate cancer Maternal Grandfather 60    Dementia Paternal Grandmother    Heart attack Paternal Grandfather    Colon cancer Paternal Great-grandmother     Current Outpatient Medications (Endocrine & Metabolic):    predniSONE (DELTASONE) 20 MG tablet, Take 2 tablets (40 mg total) by mouth daily with breakfast.  Current Outpatient Medications (Cardiovascular):    indapamide (LOZOL) 1.25 MG tablet, TAKE 1 TABLET BY MOUTH DAILY   metoprolol tartrate (LOPRESSOR) 25 MG tablet, Take 0.5 tablets (12.5 mg total) by mouth daily. Please keep scheduled appointment for future refills. Thank you.   olmesartan (BENICAR) 20 MG tablet, TAKE 1 TABLET BY MOUTH DAILY   rosuvastatin (CRESTOR) 20 MG tablet, TAKE 1 TABLET BY MOUTH DAILY  Current Outpatient Medications (Respiratory):    albuterol (VENTOLIN HFA) 108 (90 Base) MCG/ACT inhaler, INHALE TWO PUFFS BY MOUTH EVERY 6 HOURS AS NEEDED FOR WHEEZING OR SHORTNESS OF BREATH   Albuterol-Budesonide (AIRSUPRA) 90-80 MCG/ACT AERO, Inhale 2 Inhalations into the lungs every 4 (four) hours as needed.   benzonatate (TESSALON) 100 MG capsule, Take 1 capsule (100 mg total) by mouth every 8 (eight) hours.   HYDROcodone bit-homatropine (HYCODAN) 5-1.5 MG/5ML syrup, Take 5 mLs by mouth every 8 (eight) hours as needed for cough.  Current Outpatient Medications (Analgesics):    aspirin EC 81 MG tablet, Take 81 mg by mouth daily. Swallow whole.   traMADol (ULTRAM) 50 MG tablet, TAKE 2 TABLETS BY MOUTH TWICE A DAY  Current Outpatient Medications (Hematological):    warfarin (COUMADIN) 7.5 MG tablet, TAKE ONE TO ONE AND ONE-HALF (1 TO 1.5) TABLETS BY MOUTH DAILY AS DIRECTED BY ANTICOAGULATION CLINIC  Current Outpatient Medications (Other):    DULoxetine (CYMBALTA) 20 MG capsule, TAKE 2 CAPSULES BY MOUTH DAILY   gabapentin (NEURONTIN) 300 MG capsule, Take 2 capsules (600 mg total) by mouth 3 (three) times daily.   lamoTRIgine (LAMICTAL) 200 MG tablet, TAKE 2 TABLETS BY MOUTH DAILY   meclizine (ANTIVERT) 12.5 MG  tablet, TAKE ONE TABLET BY MOUTH TWICE A  DAY AS NEEDED FOR DIZZINESS   tiZANidine (ZANAFLEX) 4 MG tablet, Take 1 tablet (4 mg total) by mouth at bedtime.   Reviewed prior external information including notes and imaging from  primary care provider As well as notes that were available from care everywhere and other healthcare systems.  Past medical history, social, surgical and family history all reviewed in electronic medical record.  No pertanent information unless stated regarding to the chief complaint.   Review of Systems:  No headache, visual changes, nausea, vomiting, diarrhea, constipation, dizziness, abdominal pain, skin rash, fevers, chills, night sweats, weight loss, swollen lymph nodes, body aches, joint swelling, chest pain, shortness of breath, mood changes. POSITIVE muscle aches, body aches  Objective  Blood pressure 132/78, pulse 79, height 5' (1.524 m), SpO2 97%.   General: No apparent distress alert and oriented x3 mood and affect normal, dressed appropriately.  HEENT: Pupils equal, extraocular movements intact  Antalgic gait with the right leg.  Patient's left knee does have crepitus noted. Patient's knee does have some mild instability bilaterally.  Lower back severe tenderness to palpation in the parascapular area.  Neck exam does have some limited sidebending bilaterally as well.    Impression and Recommendations:     The above documentation has been reviewed and is accurate and complete Judi Saa, DO

## 2024-01-13 ENCOUNTER — Encounter: Payer: Self-pay | Admitting: Family Medicine

## 2024-01-13 ENCOUNTER — Ambulatory Visit (INDEPENDENT_AMBULATORY_CARE_PROVIDER_SITE_OTHER): Payer: Managed Care, Other (non HMO)

## 2024-01-13 ENCOUNTER — Ambulatory Visit (INDEPENDENT_AMBULATORY_CARE_PROVIDER_SITE_OTHER): Payer: Managed Care, Other (non HMO) | Admitting: Family Medicine

## 2024-01-13 VITALS — BP 132/78 | HR 79 | Ht 60.0 in

## 2024-01-13 DIAGNOSIS — I350 Nonrheumatic aortic (valve) stenosis: Secondary | ICD-10-CM

## 2024-01-13 DIAGNOSIS — M545 Low back pain, unspecified: Secondary | ICD-10-CM

## 2024-01-13 DIAGNOSIS — M2550 Pain in unspecified joint: Secondary | ICD-10-CM

## 2024-01-13 DIAGNOSIS — M546 Pain in thoracic spine: Secondary | ICD-10-CM | POA: Diagnosis not present

## 2024-01-13 DIAGNOSIS — M2170 Unequal limb length (acquired), unspecified site: Secondary | ICD-10-CM

## 2024-01-13 DIAGNOSIS — M542 Cervicalgia: Secondary | ICD-10-CM | POA: Diagnosis not present

## 2024-01-13 DIAGNOSIS — Q2381 Bicuspid aortic valve: Secondary | ICD-10-CM

## 2024-01-13 DIAGNOSIS — M5030 Other cervical disc degeneration, unspecified cervical region: Secondary | ICD-10-CM

## 2024-01-13 DIAGNOSIS — M1712 Unilateral primary osteoarthritis, left knee: Secondary | ICD-10-CM

## 2024-01-13 DIAGNOSIS — Q230 Congenital stenosis of aortic valve: Secondary | ICD-10-CM

## 2024-01-13 MED ORDER — TIZANIDINE HCL 4 MG PO TABS: 4.0000 mg | ORAL_TABLET | Freq: Every day | ORAL | 0 refills | Status: DC

## 2024-01-13 NOTE — Assessment & Plan Note (Signed)
Patellofemoral arthritis, discussed potential injection the patient declined.  Wants to hold off on it at the moment.  Continue to monitor otherwise.

## 2024-01-13 NOTE — Assessment & Plan Note (Signed)
Significant leg length discrepancy noted that does cause patient to have increasing discomfort patient has significant arthritic changes of the neck as well.  Discussed different medications and increase Cymbalta to 60 mg, continue the tramadol at 20 mg twice a day.  Discussed UDS today.  Patient is able to stay relatively active but has been sick recently as well.  Follow-up again 6 to 8 weeks otherwise

## 2024-01-13 NOTE — Patient Instructions (Signed)
Zanaflex 4mg  Try Cymbalta 60 for 2 weeks Consider bike with one leg at a time Labs today See me in 2-3 months

## 2024-01-15 LAB — DRUG MONITORING, PANEL 8 WITH CONFIRMATION, URINE
6 Acetylmorphine: NEGATIVE ng/mL (ref <10)
Alcohol Metabolites: NEGATIVE ng/mL (ref <500)
Amphetamines: NEGATIVE ng/mL (ref <500)
Benzodiazepines: NEGATIVE ng/mL (ref <100)
Buprenorphine, Urine: NEGATIVE ng/mL (ref <5)
Cocaine Metabolite: NEGATIVE ng/mL (ref <150)
Codeine: NEGATIVE ng/mL (ref <50)
Creatinine: 123.8 mg/dL (ref >=20.0)
Hydrocodone: 2261 ng/mL — ABNORMAL HIGH (ref <50)
Hydromorphone: 177 ng/mL — ABNORMAL HIGH (ref <50)
MDMA: NEGATIVE ng/mL (ref <500)
Marijuana Metabolite: 813 ng/mL — ABNORMAL HIGH (ref <5)
Marijuana Metabolite: POSITIVE ng/mL — AB (ref <20)
Morphine: NEGATIVE ng/mL (ref <50)
Norhydrocodone: 6687 ng/mL — ABNORMAL HIGH (ref <50)
Opiates: POSITIVE ng/mL — AB (ref <100)
Oxidant: NEGATIVE ug/mL (ref <200)
Oxycodone: NEGATIVE ng/mL (ref <100)
pH: 6.1 (ref 4.5–9.0)

## 2024-01-15 LAB — DM TEMPLATE

## 2024-01-20 ENCOUNTER — Ambulatory Visit: Payer: Managed Care, Other (non HMO)

## 2024-01-24 ENCOUNTER — Ambulatory Visit (HOSPITAL_COMMUNITY): Payer: Managed Care, Other (non HMO) | Attending: Cardiology

## 2024-01-25 ENCOUNTER — Encounter (HOSPITAL_COMMUNITY): Payer: Self-pay | Admitting: Cardiology

## 2024-01-26 ENCOUNTER — Other Ambulatory Visit: Payer: Self-pay | Admitting: Family Medicine

## 2024-02-01 ENCOUNTER — Ambulatory Visit: Payer: Managed Care, Other (non HMO) | Attending: Cardiovascular Disease

## 2024-02-01 DIAGNOSIS — Z954 Presence of other heart-valve replacement: Secondary | ICD-10-CM | POA: Diagnosis not present

## 2024-02-01 DIAGNOSIS — Q2381 Bicuspid aortic valve: Secondary | ICD-10-CM

## 2024-02-01 DIAGNOSIS — Q230 Congenital stenosis of aortic valve: Secondary | ICD-10-CM

## 2024-02-01 DIAGNOSIS — Z7901 Long term (current) use of anticoagulants: Secondary | ICD-10-CM | POA: Diagnosis not present

## 2024-02-01 LAB — POCT INR: INR: 4.7 — AB (ref 2.0–3.0)

## 2024-02-01 NOTE — Patient Instructions (Signed)
*  PLEASE PUT ON APPT NO ONSITE INTERPRETER NEEDED*   HOLD tomorrow's dose and Thursday's dose then continue taking 1.5 tablets daily except for 1 tablet on Monday, Wednesdays, and Fridays.  SEEK IMMEDIATE MEDICAL ATTENTION IF SIGNS OR SYMPTOMS OF BLEEDING OCCUR.  Stay consistent with greens each week (2 per week)  Recheck INR in 2 week.   [facility] [phone]

## 2024-02-02 ENCOUNTER — Other Ambulatory Visit: Payer: Self-pay | Admitting: Cardiology

## 2024-02-02 DIAGNOSIS — Z954 Presence of other heart-valve replacement: Secondary | ICD-10-CM

## 2024-02-04 ENCOUNTER — Other Ambulatory Visit: Payer: Self-pay | Admitting: Family Medicine

## 2024-02-17 ENCOUNTER — Ambulatory Visit: Attending: Cardiology

## 2024-02-17 DIAGNOSIS — Q230 Congenital stenosis of aortic valve: Secondary | ICD-10-CM

## 2024-02-17 DIAGNOSIS — Z7901 Long term (current) use of anticoagulants: Secondary | ICD-10-CM | POA: Diagnosis not present

## 2024-02-17 DIAGNOSIS — Z954 Presence of other heart-valve replacement: Secondary | ICD-10-CM | POA: Diagnosis not present

## 2024-02-17 DIAGNOSIS — Q2381 Bicuspid aortic valve: Secondary | ICD-10-CM | POA: Diagnosis not present

## 2024-02-17 LAB — POCT INR: INR: 5 — AB (ref 2.0–3.0)

## 2024-02-17 NOTE — Patient Instructions (Signed)
 Description   *PLEASE PUT ON APPT NO ONSITE INTERPRETER NEEDED*   HOLD Warfarin x 3 dosages, then start taking 1 tablet daily except for 1.5 tablets on Sundays and Thursdays.  SEEK IMMEDIATE MEDICAL ATTENTION IF SIGNS OR SYMPTOMS OF BLEEDING OCCUR.  Stay consistent with greens each week (2 per week)  Recheck INR in 10 days.   [facility] [phone]

## 2024-02-23 ENCOUNTER — Other Ambulatory Visit: Payer: Self-pay | Admitting: Family Medicine

## 2024-02-27 ENCOUNTER — Other Ambulatory Visit: Payer: Self-pay | Admitting: Cardiology

## 2024-02-27 DIAGNOSIS — Z954 Presence of other heart-valve replacement: Secondary | ICD-10-CM

## 2024-02-28 ENCOUNTER — Ambulatory Visit

## 2024-03-01 ENCOUNTER — Telehealth: Payer: Self-pay

## 2024-03-01 NOTE — Telephone Encounter (Signed)
 Pt aware, via voicemail, of message from Dr.Tucker regarding getting PCP referring her to dermatologist or someone else who can biopsy it.

## 2024-03-01 NOTE — Telephone Encounter (Signed)
 Danielle Obrien called reporting a concern of an area on her upper lip that started out looking like a pimple, it is now hard, raised and has a scab and bleeds. Reports no tingling.itching but states she can feel it throbbing.  It started in February and is getting bigger, she can see it, without looking in the mirror. Reports painful, 3/10 on pain scale. reports no fever/chills. Reports it is the same thickness as what she has been treated for on her vagina. It is located in one area and not spreading.   She called her PCP several times and he suggested she call us because Dr.Tucker previously referred her for a spot on her jaw line. He thinks she needs to have this accessed d/t her history.

## 2024-03-02 ENCOUNTER — Ambulatory Visit: Attending: Internal Medicine | Admitting: *Deleted

## 2024-03-02 ENCOUNTER — Other Ambulatory Visit: Payer: Self-pay | Admitting: Family Medicine

## 2024-03-02 DIAGNOSIS — Z7901 Long term (current) use of anticoagulants: Secondary | ICD-10-CM | POA: Diagnosis not present

## 2024-03-02 DIAGNOSIS — Z5181 Encounter for therapeutic drug level monitoring: Secondary | ICD-10-CM | POA: Diagnosis not present

## 2024-03-02 DIAGNOSIS — Z954 Presence of other heart-valve replacement: Secondary | ICD-10-CM

## 2024-03-02 LAB — POCT INR: POC INR: 2.4

## 2024-03-02 NOTE — Patient Instructions (Signed)
 Description   *PLEASE PUT ON APPT NO ONSITE INTERPRETER NEEDED*   Continue taking warfarin 1.5 tablets daily except for 1 tablet on Monday, Wednesday and Friday. Recheck INR in 2 weeks.   [facility] [phone]

## 2024-03-06 NOTE — Telephone Encounter (Signed)
 Per provider moved appt from 5/22 to 5/29. Patient aware of new date/time

## 2024-03-09 ENCOUNTER — Inpatient Hospital Stay: Payer: Managed Care, Other (non HMO) | Attending: Hematology

## 2024-03-09 DIAGNOSIS — D751 Secondary polycythemia: Secondary | ICD-10-CM | POA: Diagnosis not present

## 2024-03-09 DIAGNOSIS — D693 Immune thrombocytopenic purpura: Secondary | ICD-10-CM | POA: Insufficient documentation

## 2024-03-09 DIAGNOSIS — D649 Anemia, unspecified: Secondary | ICD-10-CM

## 2024-03-09 DIAGNOSIS — Z79899 Other long term (current) drug therapy: Secondary | ICD-10-CM | POA: Insufficient documentation

## 2024-03-09 LAB — CBC WITH DIFFERENTIAL/PLATELET
Abs Immature Granulocytes: 0.01 10*3/uL (ref 0.00–0.07)
Basophils Absolute: 0.1 10*3/uL (ref 0.0–0.1)
Basophils Relative: 1 %
Eosinophils Absolute: 0.2 10*3/uL (ref 0.0–0.5)
Eosinophils Relative: 2 %
HCT: 44.1 % (ref 36.0–46.0)
Hemoglobin: 15.1 g/dL — ABNORMAL HIGH (ref 12.0–15.0)
Immature Granulocytes: 0 %
Lymphocytes Relative: 34 %
Lymphs Abs: 2.2 10*3/uL (ref 0.7–4.0)
MCH: 31 pg (ref 26.0–34.0)
MCHC: 34.2 g/dL (ref 30.0–36.0)
MCV: 90.6 fL (ref 80.0–100.0)
Monocytes Absolute: 0.5 10*3/uL (ref 0.1–1.0)
Monocytes Relative: 7 %
Neutro Abs: 3.5 10*3/uL (ref 1.7–7.7)
Neutrophils Relative %: 56 %
Platelets: 149 10*3/uL — ABNORMAL LOW (ref 150–400)
RBC: 4.87 MIL/uL (ref 3.87–5.11)
RDW: 13.8 % (ref 11.5–15.5)
WBC: 6.5 10*3/uL (ref 4.0–10.5)
nRBC: 0 % (ref 0.0–0.2)

## 2024-03-09 LAB — VITAMIN B12: Vitamin B-12: 527 pg/mL (ref 180–914)

## 2024-03-10 ENCOUNTER — Other Ambulatory Visit: Payer: Self-pay | Admitting: Internal Medicine

## 2024-03-10 DIAGNOSIS — I10 Essential (primary) hypertension: Secondary | ICD-10-CM

## 2024-03-16 ENCOUNTER — Ambulatory Visit

## 2024-03-22 NOTE — Progress Notes (Unsigned)
 [name] [address] [place] [address] Phone: [phone] Subjective:   Danielle Obrien, am serving as a scribe for Dr. Ronnell Coins.  I'm seeing this patient by the request  of:  Arcadio Knuckles, MD  CC: Left knee pain follow-up, all over pain follow-up  [id number]  01/13/2024 Patellofemoral arthritis, discussed potential injection the patient declined. Wants to hold off on it at the moment. Continue to monitor otherwise.   Significant leg length discrepancy noted that does cause patient to have increasing discomfort patient has significant arthritic changes of the neck as well.  Discussed different medications and increase Cymbalta  to 60 mg, continue the tramadol  at 20 mg twice a day.  Discussed UDS today.  Patient is able to stay relatively active but has been sick recently as well.  Follow-up again 6 to 8 weeks otherwise      Update 03/23/2024 Danielle Obrien is a 43 y.o. female coming in with complaint of L knee pain.  Does have significant leg length discrepancy as well as patellofemoral arthritis.  Past medical history is significant for aortic stenosis status post aortic valve replacement.  On tramadol  for chronic pain.  Patient states that the back of her L knee is painful.   L hip is pain and she in not sleeping due to pain. Hip causing back to hurt.   [name] one week ago on medial side of R knee. Ecchymosis present. Soreness over contusion.        Past Medical History:  Diagnosis Date   ADD (attention deficit disorder)    Alcohol abuse, in remission 2012   per pt in remission since 2012   Anticoagulated on Coumadin     coumadin --- managed by cardiology/ coumadin  clinic   Arthritis    In hips   Asthma    allergy induced, rare inhaler use   Auditory processing disorder    per pt very HOH, first language ASL   Bipolar disorder (HCC)    Chronic left hip pain    Chronic vertigo    Complication of anesthesia    per pt due  to auditory processing disorder pt is unable to hear when waking up and also gets very anxious   Family history of uterine cancer    GAD (generalized anxiety disorder)    GERD (gastroesophageal reflux disease)    History of cervical dysplasia    History of condyloma acuminatum 2017   s/p laser ablation vulva   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   biopsy and CO2 laser ablation   HOH (hard of hearing)    per pt very HOH due to auditroy processing disorder   Hyperlipidemia    Hypertension    Muscle spasms of both lower extremities    both hips   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   23 mm Sorin Carbomedics Top Hat bileaflet mechanical valve via right anterior mini thoracotomy   Vulvar dysplasia    Past Surgical History:  Procedure Laterality Date   ANTERIOR CRUCIATE LIGAMENT REPAIR  1993   AORTIC VALVE REPLACEMENT N/A 11/03/2017   Procedure: MINIMALLY INVASIVE AORTIC VALVE REPLACEMENT( MINI THORACOTOMY);  Surgeon: Gardenia Jump, MD;  Location: [facility] OR;  Service: Open Heart Surgery;  Laterality: N/A;   CERVICAL BIOPSY  W/ LOOP ELECTRODE EXCISION  2010   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 ECC   COLPOSCOPY  06/30/2001   CIN I   COLPOSCOPY  08/30/2004   ECC--atypia   COLPOSCOPY N/A 01/21/2016   Procedure: COLPOSCOPY with vaginal biopsy with CO 2 Laser of Vaginal and vulvar condyloma;  Surgeon: Greta Leatherwood,  MD;  Location: [facility];  Service: Gynecology;  Laterality: N/A;  [name] will be here 2/21 for 1115 case confirmed 01/16/15 - TS   HARDWARE REMOVAL Left 1992   Left hip   INGUINAL HERNIA REPAIR Bilateral    1981;  [id number]   LEFT AND RIGHT HEART CATHETERIZATION WITH CORONARY ANGIOGRAM N/A 05/18/2014   Procedure: LEFT AND RIGHT HEART CATHETERIZATION WITH CORONARY ANGIOGRAM;  Surgeon: Lucendia Rusk, MD;  Location: [facility] CATH LAB;  Service: Cardiovascular;  Laterality: N/A;   LESION REMOVAL Right 05/12/2022   Procedure: Faustino Hook OF RIGHT CHEEK CYST;  Surgeon: Barb Bonito, MD;  Location: [facility];  Service: Plastics;  Laterality: Right;   LYMPH NODE BIOPSY  1995   NASAL SEPTUM SURGERY  2002   ORIF HIP FRACTURE Left 1990   per pt and  took out growth plate in Right knee   ROBOTIC ASSISTED TOTAL HYSTERECTOMY  02/26/2009   @[facility]    TEE WITHOUT CARDIOVERSION N/A 11/03/2017   Procedure: TRANSESOPHAGEAL ECHOCARDIOGRAM (TEE);  Surgeon: Gardenia Jump, MD;  Location: [facility] OR;  Service: Open Heart Surgery;  Laterality: N/A;   TONSILLECTOMY AND ADENOIDECTOMY  1990   TYMPANOSTOMY TUBE PLACEMENT Bilateral    x3  per pt last one 1982   VULVA /PERINEUM BIOPSY N/A 05/12/2022   Procedure: POSSIBLE VULVAR BIOPSY; POSSIBLE VAGINAL BIOPSY;  Surgeon: Suzi Essex, MD;  Location: [facility];  Service: Gynecology;  Laterality: N/A;   VULVECTOMY N/A 05/12/2022   Procedure: WIDE EXCISION VULVECTOMY;  Surgeon: Suzi Essex, MD;  Location: [facility];  Service: Gynecology;  Laterality: N/A;   VULVECTOMY N/A 08/12/2022   Procedure: WIDE LOCAL EXCISION VULVECTOMY;  Surgeon: Suzi Essex, MD;  Location: [facility];  Service: Gynecology;  Laterality: N/A;   Social History   Socioeconomic History   Marital status: Single    Spouse name: Not on file   Number of children: Not on file   Years of education: Not on file   Highest education  level: Not on file  Occupational History   Not on file  Tobacco Use   Smoking status: Every Day    Current packs/day: 0.25    Average packs/day: 0.3 packs/day for 20.0 years (5.0 ttl pk-yrs)    Types: Cigarettes   Smokeless tobacco: Never  Vaping Use   Vaping status: Never Used  Substance and Sexual Activity   Alcohol use: No    Comment: in remission since 2012   Drug use: Yes    Types: Marijuana    Comment: 08-11-22 last, per pt smokes on average 1/4oz per week   Sexual activity: Yes    Partners: Male    Birth control/protection: Surgical    Comment: Hysterectomy  Other Topics Concern  Not on file  Social History Narrative   Not on file   Social Drivers of Health   Financial Resource Strain: Low Risk  (12/10/2018)   Overall Financial Resource Strain (CARDIA)    Difficulty of Paying Living Expenses: Not hard at all  Food Insecurity: No Food Insecurity (07/07/2023)   Hunger Vital Sign    Worried About Running Out of Food in the Last Year: Never true    Ran Out of Food in the Last Year: Never true  Transportation Needs: No Transportation Needs (07/07/2023)   PRAPARE - Administrator, Civil Service (Medical): No    Lack of Transportation (Non-Medical): No  Physical Activity: Unknown (12/10/2018)   Exercise Vital Sign    Days of Exercise per Week: Patient declined    Minutes of Exercise per Session: Patient declined  Stress: Not on file  Social Connections: Unknown (12/10/2018)   Social Connection and Isolation Panel [NHANES]    Frequency of Communication with Friends and Family: Patient declined    Frequency of Social Gatherings with Friends and Family: Patient declined    Attends Religious Services: Patient declined    Database administrator or Organizations: Patient declined    Attends Engineer, structural: Patient declined    Marital Status: Patient declined   Allergies  Allergen Reactions   Demerol  Nausea And Vomiting   Ibuprofen Other (See  Comments)    Gastritis    Family History  Problem Relation Age of Onset   Hyperlipidemia Mother    Hypertension Mother    Thyroid  disease Mother    Other Mother        Pituitary tumor   Hyperlipidemia Father    Hypertension Father    Migraines Father    Skin cancer Father    Hypertension Brother    Hyperlipidemia Brother    Heart attack Paternal Uncle    Thyroid  disease Maternal Grandmother    Uterine cancer Maternal Grandmother 47   Dementia Maternal Grandmother    Thyroid  disease Maternal Grandfather    Prostate cancer Maternal Grandfather 60   Dementia Paternal Grandmother    Heart attack Paternal Grandfather    Colon cancer Paternal Great-grandmother     Current Outpatient Medications (Endocrine & Metabolic):    predniSONE  (DELTASONE ) 20 MG tablet, Take 2 tablets (40 mg total) by mouth daily with breakfast.  Current Outpatient Medications (Cardiovascular):    indapamide  (LOZOL ) 1.25 MG tablet, TAKE 1 TABLET BY MOUTH DAILY   metoprolol  tartrate (LOPRESSOR ) 25 MG tablet, Take 0.5 tablets (12.5 mg total) by mouth daily. Please keep scheduled appointment for future refills. Thank you.   olmesartan  (BENICAR ) 20 MG tablet, TAKE 1 TABLET BY MOUTH DAILY   rosuvastatin  (CRESTOR ) 20 MG tablet, TAKE 1 TABLET BY MOUTH DAILY  Current Outpatient Medications (Respiratory):    albuterol  (VENTOLIN  HFA) 108 (90 Base) MCG/ACT inhaler, INHALE TWO PUFFS BY MOUTH EVERY 6 HOURS AS NEEDED FOR WHEEZING OR SHORTNESS OF BREATH   Albuterol -Budesonide  (AIRSUPRA ) 90-80 MCG/ACT AERO, Inhale 2 Inhalations into the lungs every 4 (four) hours as needed.   benzonatate  (TESSALON ) 100 MG capsule, Take 1 capsule (100 mg total) by mouth every 8 (eight) hours.   HYDROcodone  bit-homatropine (HYCODAN) 5-1.5 MG/5ML syrup, Take 5 mLs by mouth every 8 (eight) hours as needed for cough.  Current Outpatient Medications (Analgesics):    aspirin  EC 81 MG tablet, Take 81 mg by mouth daily. Swallow whole.   traMADol   (ULTRAM ) 50 MG tablet, Take 2 tablets (  100 mg total) by mouth every 12 (twelve) hours as needed.  Current Outpatient Medications (Hematological):    warfarin (COUMADIN ) 7.5 MG tablet, TAKE ONE TO ONE AND ONE-HALF (1 TO 1.5) TABLETS BY MOUTH AS DIRECTED BY ANTICOAGULATION CLINIC  Current Outpatient Medications (Other):    DULoxetine  (CYMBALTA ) 20 MG capsule, TAKE 2 CAPSULES BY MOUTH DAILY   gabapentin  (NEURONTIN ) 300 MG capsule, Take 2 capsules (600 mg total) by mouth 3 (three) times daily.   lamoTRIgine  (LAMICTAL ) 200 MG tablet, TAKE 2 TABLETS BY MOUTH DAILY   meclizine  (ANTIVERT ) 12.5 MG tablet, TAKE ONE TABLET BY MOUTH TWICE A DAY AS NEEDED FOR DIZZINESS   tiZANidine  (ZANAFLEX ) 4 MG tablet, Take 1 tablet (4 mg total) by mouth at bedtime.   Reviewed prior external information including notes and imaging from  primary care provider As well as notes that were available from care everywhere and other healthcare systems.  Past medical history, social, surgical and family history all reviewed in electronic medical record.  No pertanent information unless stated regarding to the chief complaint.   Review of Systems:  No headache, visual changes, nausea, vomiting, diarrhea, constipation, dizziness, abdominal pain, skin rash, fevers, chills, night sweats, weight loss, swollen lymph nodes, body aches, joint swelling, chest pain, shortness of breath, mood changes. POSITIVE muscle aches  Objective  Blood pressure 120/78, pulse 60, height 5' (1.524 m), weight 110 lb (49.9 kg), SpO2 98%.   General: No apparent distress alert and oriented x3 mood and affect normal, dressed appropriately.  HEENT: Pupils equal, extraocular movements intact  Respiratory: Patient's speak in full sentences and does not appear short of breath  Cardiovascular: No lower extremity edema, non tender, no erythema  Left knee does have some fullness noted in the popliteal area.  Tightness of the popliteal tendon it appears.   Patient does have some weakness of the hamstring but this seems to be bilaterally.  Leg length discrepancy noted. Low back does have some tightness of the sacroiliac joint bilaterally as well.  [id number]; 15 additional minutes spent for Therapeutic exercises as stated in above notes.  This included exercises focusing on stretching, strengthening, with significant focus on eccentric aspects.   Long term goals include an improvement in range of motion, strength, endurance as well as avoiding reinjury. Patient's frequency would include in 1-2 times a day, 3-5 times a week for a duration of 6-12 weeks. Low back exercises that included:  Pelvic tilt/bracing instruction to focus on control of the pelvic girdle and lower abdominal muscles  Glute strengthening exercises, focusing on proper firing of the glutes without engaging the low back muscles Proper stretching techniques for maximum relief for the hamstrings, hip flexors, low back and some rotation where tolerated   Proper technique shown and discussed handout in great detail with ATC.  All questions were discussed and answered.     Impression and Recommendations:     The above documentation has been reviewed and is accurate and complete Derin Granquist M Yohannes Waibel, DO

## 2024-03-23 ENCOUNTER — Encounter: Payer: Self-pay | Admitting: Family Medicine

## 2024-03-23 ENCOUNTER — Ambulatory Visit: Payer: Managed Care, Other (non HMO) | Admitting: Family Medicine

## 2024-03-23 VITALS — BP 120/78 | HR 60 | Ht 60.0 in | Wt 110.0 lb

## 2024-03-23 DIAGNOSIS — M533 Sacrococcygeal disorders, not elsewhere classified: Secondary | ICD-10-CM

## 2024-03-23 DIAGNOSIS — M76892 Other specified enthesopathies of left lower limb, excluding foot: Secondary | ICD-10-CM

## 2024-03-23 NOTE — Patient Instructions (Signed)
 Doctor's orders to get top lip pierced Arnica lotion for bruise Ice instead of heat See me again in 3 months

## 2024-03-23 NOTE — Assessment & Plan Note (Signed)
 Known arthritic changes but does have weakness of the hamstring that I think is contributing.  Discussed icing regimen.  Unable to take anti-inflammatories.  Discussed potential compression sleeve.  Worsening pain can consider the possibility of injection.  Patient is in agreement with the plan.  Follow-up again in 6 to 8 weeks otherwise.

## 2024-03-23 NOTE — Assessment & Plan Note (Signed)
 Tightness noted.  No exacerbation of the underlying problem.  Likely some compensation for the instability noted.  Leg length discrepancy as well.  Patient is accompanied with a sign language interpreter today.  Discussed potential injections if needed.  Follow-up again in 2 months

## 2024-03-25 ENCOUNTER — Other Ambulatory Visit: Payer: Self-pay | Admitting: Internal Medicine

## 2024-03-25 DIAGNOSIS — F3177 Bipolar disorder, in partial remission, most recent episode mixed: Secondary | ICD-10-CM

## 2024-03-30 ENCOUNTER — Other Ambulatory Visit: Payer: Self-pay | Admitting: Family Medicine

## 2024-04-03 ENCOUNTER — Other Ambulatory Visit: Payer: Self-pay | Admitting: Cardiology

## 2024-04-03 ENCOUNTER — Ambulatory Visit: Attending: Cardiology

## 2024-04-03 DIAGNOSIS — Z7901 Long term (current) use of anticoagulants: Secondary | ICD-10-CM

## 2024-04-03 DIAGNOSIS — Z954 Presence of other heart-valve replacement: Secondary | ICD-10-CM

## 2024-04-03 LAB — POCT INR: INR: 4.6 — AB (ref 2.0–3.0)

## 2024-04-03 NOTE — Patient Instructions (Signed)
 Description   *PLEASE PUT ON APPT NO ONSITE INTERPRETER NEEDED*  HOLD today's dose and then START taking warfarin 1 tablet daily except for 1.5 tablets on Tuesdays and Thursdays.  Recheck INR in 2 weeks.  Coumadin  Clinic [phone]

## 2024-04-06 ENCOUNTER — Ambulatory Visit: Admitting: Plastic Surgery

## 2024-04-06 ENCOUNTER — Encounter: Payer: Self-pay | Admitting: Plastic Surgery

## 2024-04-06 VITALS — BP 114/79 | HR 76 | Ht 60.0 in | Wt 113.6 lb

## 2024-04-06 DIAGNOSIS — L989 Disorder of the skin and subcutaneous tissue, unspecified: Secondary | ICD-10-CM

## 2024-04-06 DIAGNOSIS — J3489 Other specified disorders of nose and nasal sinuses: Secondary | ICD-10-CM

## 2024-04-06 DIAGNOSIS — J301 Allergic rhinitis due to pollen: Secondary | ICD-10-CM

## 2024-04-06 DIAGNOSIS — D489 Neoplasm of uncertain behavior, unspecified: Secondary | ICD-10-CM

## 2024-04-06 NOTE — Progress Notes (Signed)
 Referring Provider Arcadio Knuckles, MD [address] [place],  [place] [address]   CC:  Chief Complaint  Patient presents with   Consult           Danielle Obrien is an 43 y.o. female.  HPI: Danielle Obrien is a 43 year old female who presents today with a skin lesion on the right side of her upper lip.  She states it started out as a small pimple and has rapidly grown over the last 3 years.  It is now approximately 5 mm across and 5 mm high.  She states that it gets caught on her pillow at night it is difficult to cover because a bandage will often catch on her lip.  She has several other complaints including skin lesions which she would like to have evaluated.  She also notes swelling in her face due to allergens and difficulty breathing through her nose she is requested consults to dermatology, ENT, and an allergist.  Allergies  Allergen Reactions   Demerol  Nausea And Vomiting   Ibuprofen Other (See Comments)    Gastritis     Outpatient Encounter Medications as of 04/06/2024  Medication Sig   albuterol  (VENTOLIN  HFA) 108 (90 Base) MCG/ACT inhaler INHALE TWO PUFFS BY MOUTH EVERY 6 HOURS AS NEEDED FOR WHEEZING OR SHORTNESS OF BREATH   Albuterol -Budesonide  (AIRSUPRA ) 90-80 MCG/ACT AERO Inhale 2 Inhalations into the lungs every 4 (four) hours as needed.   aspirin  EC 81 MG tablet Take 81 mg by mouth daily. Swallow whole.   benzonatate  (TESSALON ) 100 MG capsule Take 1 capsule (100 mg total) by mouth every 8 (eight) hours.   DULoxetine  (CYMBALTA ) 20 MG capsule TAKE 2 CAPSULES BY MOUTH DAILY   gabapentin  (NEURONTIN ) 300 MG capsule TAKE TWO CAPSULES BY MOUTH THREE TIMES A DAY   indapamide  (LOZOL ) 1.25 MG tablet TAKE 1 TABLET BY MOUTH DAILY   lamoTRIgine  (LAMICTAL ) 200 MG tablet TAKE 2 TABLETS BY MOUTH DAILY   meclizine  (ANTIVERT ) 12.5 MG tablet TAKE ONE TABLET BY MOUTH TWICE A DAY AS NEEDED FOR DIZZINESS   metoprolol  tartrate (LOPRESSOR ) 25 MG tablet Take 0.5 tablets (12.5 mg total) by  mouth daily. Please keep scheduled appointment for future refills. Thank you.   olmesartan  (BENICAR ) 20 MG tablet TAKE 1 TABLET BY MOUTH DAILY   rosuvastatin  (CRESTOR ) 20 MG tablet TAKE 1 TABLET BY MOUTH DAILY   tiZANidine  (ZANAFLEX ) 4 MG tablet Take 1 tablet (4 mg total) by mouth at bedtime.   traMADol  (ULTRAM ) 50 MG tablet Take 2 tablets (100 mg total) by mouth every 12 (twelve) hours as needed.   warfarin (COUMADIN ) 7.5 MG tablet TAKE 1 TO 1 AND 1/2 TABLET BY MOUTH AS DIRECTED BY ANTCOAGULATION CLINIC   HYDROcodone  bit-homatropine (HYCODAN) 5-1.5 MG/5ML syrup Take 5 mLs by mouth every 8 (eight) hours as needed for cough.   predniSONE  (DELTASONE ) 20 MG tablet Take 2 tablets (40 mg total) by mouth daily with breakfast.   No facility-administered encounter medications on file as of 04/06/2024.     Past Medical History:  Diagnosis Date   ADD (attention deficit disorder)    Alcohol abuse, in remission 2012   per pt in remission since 2012   Anticoagulated on Coumadin     coumadin --- managed by cardiology/ coumadin  clinic   Arthritis    In hips   Asthma    allergy induced, rare inhaler use   Auditory processing disorder    per pt very HOH, first language ASL  Bipolar disorder (HCC)    Chronic left hip pain    Chronic vertigo    Complication of anesthesia    per pt due to auditory processing disorder pt is unable to hear when waking up and also gets very anxious   Family history of uterine cancer    GAD (generalized anxiety disorder)    GERD (gastroesophageal reflux disease)    History of cervical dysplasia    History of condyloma acuminatum 2017   s/p laser ablation vulva   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   biopsy and CO2 laser ablation   HOH (hard of hearing)    per pt very HOH due to auditroy processing disorder   Hyperlipidemia    Hypertension    Muscle spasms of both lower extremities    both hips   S/P minimally invasive aortic valve replacement with a bileaflet  mechanical valve 11/03/2017   23 mm Sorin Carbomedics Top [place] bileaflet mechanical valve via right anterior mini thoracotomy   Vulvar dysplasia     Past Surgical History:  Procedure Laterality Date   ANTERIOR CRUCIATE LIGAMENT REPAIR  1993   AORTIC VALVE REPLACEMENT N/A 11/03/2017   Procedure: MINIMALLY INVASIVE AORTIC VALVE REPLACEMENT( MINI THORACOTOMY);  Surgeon: Gardenia Jump, MD;  Location: [facility] OR;  Service: Open Heart Surgery;  Laterality: N/A;   CERVICAL BIOPSY  W/ LOOP ELECTRODE EXCISION  2010   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 ECC   COLPOSCOPY  06/30/2001   CIN I   COLPOSCOPY  08/30/2004   ECC--atypia   COLPOSCOPY N/A 01/21/2016   Procedure: COLPOSCOPY with vaginal biopsy with CO 2 Laser of Vaginal and vulvar condyloma;  Surgeon: Greta Leatherwood, MD;  Location: [facility];  Service: Gynecology;  Laterality: N/A;  Danielle Obrien will be here 2/21 for 1115 case confirmed 01/16/15 - TS   HARDWARE REMOVAL Left 1992   Left hip   INGUINAL HERNIA REPAIR Bilateral    1981;  10982   LEFT AND RIGHT HEART CATHETERIZATION WITH CORONARY ANGIOGRAM N/A 05/18/2014   Procedure: LEFT AND RIGHT HEART CATHETERIZATION WITH CORONARY ANGIOGRAM;  Surgeon: Lucendia Rusk, MD;  Location: [facility] CATH LAB;  Service: Cardiovascular;  Laterality: N/A;   LESION REMOVAL Right 05/12/2022   Procedure: Faustino Hook OF RIGHT CHEEK CYST;  Surgeon: Barb Bonito, MD;  Location: [facility];   Service: Plastics;  Laterality: Right;   LYMPH NODE BIOPSY  1995   NASAL SEPTUM SURGERY  2002   ORIF HIP FRACTURE Left 1990   per pt and  took out growth plate in Right knee   ROBOTIC ASSISTED TOTAL HYSTERECTOMY  02/26/2009   @[facility]    TEE WITHOUT CARDIOVERSION N/A 11/03/2017   Procedure: TRANSESOPHAGEAL ECHOCARDIOGRAM (TEE);  Surgeon: Gardenia Jump, MD;  Location: [facility] OR;  Service: Open Heart Surgery;  Laterality: N/A;   TONSILLECTOMY AND ADENOIDECTOMY  1990   TYMPANOSTOMY TUBE PLACEMENT Bilateral    x3  per pt last one 1982  VULVA /PERINEUM BIOPSY N/A 05/12/2022   Procedure: POSSIBLE VULVAR BIOPSY; POSSIBLE VAGINAL BIOPSY;  Surgeon: Suzi Essex, MD;  Location: [facility];  Service: Gynecology;  Laterality: N/A;   VULVECTOMY N/A 05/12/2022   Procedure: WIDE EXCISION VULVECTOMY;  Surgeon: Suzi Essex, MD;  Location: [facility];  Service: Gynecology;  Laterality: N/A;   VULVECTOMY N/A 08/12/2022   Procedure: WIDE LOCAL EXCISION VULVECTOMY;  Surgeon: Suzi Essex, MD;  Location: [facility];  Service: Gynecology;  Laterality: N/A;    Family History  Problem Relation Age of Onset   Hyperlipidemia Mother    Hypertension Mother    Thyroid  disease Mother    Other Mother        Pituitary tumor   Hyperlipidemia Father    Hypertension Father    Migraines Father    Skin cancer Father    Hypertension Brother    Hyperlipidemia Brother    Heart attack Paternal Uncle    Thyroid  disease Maternal Grandmother    Uterine cancer Maternal Grandmother 51   Dementia Maternal Grandmother    Thyroid  disease Maternal Grandfather    Prostate cancer Maternal Grandfather 67   Dementia Paternal Grandmother    Heart attack Paternal Grandfather    Colon cancer Paternal Great-grandmother     Social History   Social History Narrative   Not on file     Review of Systems General: Denies fevers, chills, weight loss CV: Denies chest  pain, shortness of breath, palpitations Skin: 5 x 5 mm raised lesion which the patient says has been growing over the past 3 years.  It is not painful unless it catches on surrounding fabric.  Physical Exam    04/06/2024    9:16 AM 03/23/2024    9:03 AM 01/13/2024   10:29 AM  Vitals with BMI  Height 5\' 0"  5\' 0"  5\' 0"   Weight 113 lbs 10 oz 110 lbs   BMI 22.19 21.48   Systolic 114 120 295  Diastolic 79 78 78  Pulse 76 60 79    General:  No acute distress,  Alert and oriented, Non-Toxic, Normal speech and affect Integument: As noted a 5 x 5 mm raised lesion on the right upper lip.  This does extend rather deeply into the lip but does not violate the mucosa inside the mouth.  There are no other associated lesions. 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  We discussed the fact that there will be a scar and she may require additional procedures based on the pathology.  Photographs were obtained today with her consent.  Will submit her for surgery at her request.  A clearance from cardiology will be submitted today.  Will also request an anesthesia appointment for evaluation prior to surgery.  I believe this can safely be performed as an outpatient but will defer to the anesthesia consult.  Teretha Ferguson 04/06/2024, 9:46 AM

## 2024-04-07 ENCOUNTER — Other Ambulatory Visit: Payer: Self-pay | Admitting: Family Medicine

## 2024-04-07 ENCOUNTER — Telehealth: Payer: Self-pay

## 2024-04-07 NOTE — Telephone Encounter (Signed)
   Pre-operative Risk Assessment    Patient Name: Danielle Obrien  DOB: 1981/10/05 MRN: [id number]   Date of last office visit: 01/06/2024 Dr. Gaylyn Keas Date of next office visit: NONE   Request for Surgical Clearance    Procedure:  Excision of facial lesion, right upper lip  Date of Surgery:  Clearance TBD                                Surgeon: Dr. Larraine Plush  Surgeon's Group or Practice Name: [facility]  Phone number: [phone] Fax number: [phone]   Type of Clearance Requested:   - Medical  - Pharmacy:  Hold Warfarin (Coumadin )     Type of Anesthesia:  Not Indicated   Additional requests/questions:    Tyrus Gallus   04/07/2024, 8:29 AM

## 2024-04-10 NOTE — Telephone Encounter (Signed)
 Please advise holding Coumadin  prior to Excision of facial lesion right upper lip.   Thank you!  DW

## 2024-04-13 ENCOUNTER — Ambulatory Visit: Attending: Cardiology | Admitting: *Deleted

## 2024-04-13 DIAGNOSIS — Z954 Presence of other heart-valve replacement: Secondary | ICD-10-CM

## 2024-04-13 DIAGNOSIS — Z7901 Long term (current) use of anticoagulants: Secondary | ICD-10-CM | POA: Diagnosis not present

## 2024-04-13 LAB — POCT INR: POC INR: 2.8

## 2024-04-13 NOTE — Patient Instructions (Signed)
 Description   *PLEASE PUT ON APPT NO ONSITE INTERPRETER NEEDED*  Continue taking warfarin 1 tablet daily except for 1.5 tablets on Sundays, Tuesdays and Thursdays  Recheck INR in 3 weeks.  Coumadin  Clinic [phone]

## 2024-04-17 NOTE — Telephone Encounter (Signed)
   Name: Danielle Obrien  DOB: 1981-02-15  MRN: [id number]  Primary Cardiologist: Gaylyn Keas, MD   Preoperative team, please contact this patient and set up a phone call appointment for further preoperative risk assessment. Please obtain consent and complete medication review. Thank you for your help.  I confirm that guidance regarding antiplatelet and oral anticoagulation therapy has been completed and, if necessary, noted below.  Patient with diagnosis of mechanical aortic valve replacement on warfarin for anticoagulation.     Procedure: Excision of facial lesion, right upper lip  Date of procedure: TBD   Per office protocol, patient can hold warfarin for 3-5 days prior to procedure.     Patient will NOT need bridging with Lovenox  (enoxaparin ) around procedure.  I also confirmed the patient resides in the state of [place] . As per [facility] Medical Board telemedicine laws, the patient must reside in the state in which the provider is licensed.   Ava Boatman, NP 04/17/2024, 12:16 PM [facility]

## 2024-04-17 NOTE — Telephone Encounter (Signed)
 Left message to call back to schedule tele pre op appt.

## 2024-04-17 NOTE — Telephone Encounter (Signed)
 Patient with diagnosis of mechanical aortic valve replacement on warfarin for anticoagulation.    Procedure: Excision of facial lesion, right upper lip  Date of procedure: TBD  Per office protocol, patient can hold warfarin for 3-5 days prior to procedure.    Patient will NOT need bridging with Lovenox  (enoxaparin ) around procedure.  **This guidance is not considered finalized until pre-operative APP has relayed final recommendations.**

## 2024-04-18 NOTE — Telephone Encounter (Signed)
 2nd attempt to reach pt regarding surgical clearance and the need for an TELE appointment.  Left pt a detailed message to call back and get that scheduled.

## 2024-04-19 NOTE — Telephone Encounter (Signed)
 3rd attempt to reach the pt to schedule a tele preop appt. I will update the requesting office pt needs to call and schedule a tele preop appt.   Will remove from preop callback until the pt calls back.

## 2024-04-20 ENCOUNTER — Ambulatory Visit: Payer: Managed Care, Other (non HMO) | Admitting: Gynecologic Oncology

## 2024-04-27 ENCOUNTER — Encounter: Payer: Self-pay | Admitting: Gynecologic Oncology

## 2024-04-27 ENCOUNTER — Inpatient Hospital Stay: Attending: Hematology | Admitting: Gynecologic Oncology

## 2024-04-27 ENCOUNTER — Inpatient Hospital Stay

## 2024-04-27 VITALS — BP 139/78 | HR 78 | Temp 97.9°F | Resp 17 | Ht 59.0 in | Wt 114.0 lb

## 2024-04-27 DIAGNOSIS — D071 Carcinoma in situ of vulva: Secondary | ICD-10-CM | POA: Diagnosis present

## 2024-04-27 DIAGNOSIS — B977 Papillomavirus as the cause of diseases classified elsewhere: Secondary | ICD-10-CM | POA: Diagnosis not present

## 2024-04-27 DIAGNOSIS — Z7251 High risk heterosexual behavior: Secondary | ICD-10-CM

## 2024-04-27 DIAGNOSIS — Z113 Encounter for screening for infections with a predominantly sexual mode of transmission: Secondary | ICD-10-CM | POA: Insufficient documentation

## 2024-04-27 DIAGNOSIS — K1379 Other lesions of oral mucosa: Secondary | ICD-10-CM | POA: Insufficient documentation

## 2024-04-27 DIAGNOSIS — Z9079 Acquired absence of other genital organ(s): Secondary | ICD-10-CM | POA: Diagnosis not present

## 2024-04-27 LAB — HEPATITIS PANEL, ACUTE
HCV Ab: NONREACTIVE
Hep A IgM: NONREACTIVE
Hep B C IgM: NONREACTIVE
Hepatitis B Surface Ag: NONREACTIVE

## 2024-04-27 LAB — HIV ANTIBODY (ROUTINE TESTING W REFLEX): HIV Screen 4th Generation wRfx: NONREACTIVE

## 2024-04-27 NOTE — Patient Instructions (Signed)
 It was good to see you today.  I will see you in 6 months.  Please call if anything changes in terms of new symptoms before then.

## 2024-04-27 NOTE — Progress Notes (Signed)
 Gynecologic Oncology Return Clinic Visit  04/27/24  Reason for Visit: follow-up  Treatment History: Dysplasia history:  03/19/22: Vaginal cuff biopsy - VAIN1; left buttocks biopsy - VIN3, Perineal biopsy - VIN 2-3, mons biopsy - VIN 2-3. 01/2022 Vaginal pap - negative, HR HPV positive 12/2018: Pap - negative, HR HPV positive (negative for 16, 18 and 45) 05/2017: vaginal biopsies - one with benign squamous mucosa, other with VAIN I 04/2017: Vaginal pap - negative, HR HPV not detected. 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 Vaginal biopsies, one with VAIN 2, other VAIN 1. 10/2015: vaginal cuff biopsy: VAIN II.  Vulvar biopsies performed including mons and right labia, both showing condyloma. 08/2015: Vaginal pap - LSIL, HR HPV positive. 02/2014: Vaginal cuff biopsy showed condylomata acuminata, p16 negative for diffuse positive staining.  No malignancy or high-grade dysplasia.  Right labial biopsy shows condyloma. 01/2014: Vaginal pap - LSIL.  Reactive squamous cells present. HR HPV positive. 01/2009: Pathology from hysterectomy shows chronic mild inflammation of the cervix and endocervix, mesonephric hyperplasia.  No evidence of malignancy.  Secretory type endometrium. 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  Endo and ectocervical margins negative for mucosa but partially denuded.  Stromal invasion noted. 10/2008: Cervical biopsy at 6:00 shows HPV effect consistent with low-grade dysplasia.  ECC with 1 fragment of ectocervical/squamous metaplasia with low-grade intraepithelial lesion and focal changes consistent with CIN-2. 07/2020: ECC shows benign endocervix, no dysplasia. 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  Cervical biopsy at 1:00 showed squamous mucosa with koilocytic atypia, no dysplasia identified.  ECC shows rare detached fragments of slightly  dysplastic squamous mucosa with benign endocervical mucosa.   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   07/23/22: Vulvar biopsy given symptoms at prior healing incision shows VIN3.    08/12/22: Partial simple anterior left vulvectomy. Pathology: VIN3, margins negative. Right mons biopsy and posterior fourchette biopsy negative for dysplasia or malignancy.   01/07/23: Left peri-anal biopsy shows reactive squamous mucosa, no dysplasia.   08/26/23: Pap - ASCUS, HR HPV + (16/18/45 negative)  10/2023: Colposcopy - vaginal cuff biopsy with [id number]  Interval History: Doing well.  Denies any vulvar symptoms including pruritus, pain, discharge, or bleeding.  Recently had unprotected intercourse, interested in STD testing.  Has an exophytic lesion above her right upper lip that has continued to grow and bleeds very easily, scheduled to have surgery with plastic surgeon next month.  Past Medical/Surgical History: Past Medical History:  Diagnosis Date   ADD (attention deficit disorder)    Alcohol abuse, in remission 2012   per pt in remission since 2012   Anticoagulated on Coumadin     coumadin --- managed by cardiology/ coumadin  clinic   Arthritis    In hips   Asthma    allergy induced, rare inhaler use   Auditory processing disorder    per pt very HOH, first language ASL   Bipolar disorder (HCC)    Chronic left hip pain    Chronic vertigo    Complication of anesthesia    per pt due to auditory processing disorder pt is unable to hear when waking up and also gets very anxious   Family history of uterine cancer    GAD (generalized anxiety disorder)    GERD (gastroesophageal reflux disease)    History of cervical dysplasia  History of condyloma acuminatum 2017   s/p laser ablation vulva   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   biopsy and CO2 laser ablation   HOH (hard of hearing)    per pt very HOH due to auditroy processing disorder    Hyperlipidemia    Hypertension    Muscle spasms of both lower extremities    both hips   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   23 mm Sorin [employer] [place] bileaflet mechanical valve via right anterior mini thoracotomy   Vulvar dysplasia     Past Surgical History:  Procedure Laterality Date   ANTERIOR CRUCIATE LIGAMENT REPAIR  1993   AORTIC VALVE REPLACEMENT N/A 11/03/2017   Procedure: MINIMALLY INVASIVE AORTIC VALVE REPLACEMENT( MINI THORACOTOMY);  Surgeon: Gardenia Jump, MD;  Location: [facility];  Service: Open Heart Surgery;  Laterality: N/A;   CERVICAL BIOPSY  W/ LOOP ELECTRODE EXCISION  2010   CIN III w/extension to glands   CHOLECYSTECTOMY N/A 07/11/2023   Procedure: LAPAROSCOPIC CHOLECYSTECTOMY WITH POSSIBLE INTRAOPERATIVE CHOLANGIOGRAM;  Surgeon: Adalberto Acton, MD;  Location: [facility] OR;  Service: General;  Laterality: N/A;   CLOSED REDUCTION HIP DISLOCATION Left 1981   CO2 LASER APPLICATION N/A 05/12/2022   Procedure: CO2 LASER APPLICATION TO VULVA;  Surgeon: Suzi Essex, MD;  Location: [facility];  Service: Gynecology;  Laterality: N/A;   COLPOSCOPY  10/30/2008   CIN I & II   COLPOSCOPY  07/31/2000   Neg. ECC   COLPOSCOPY  06/30/2001   CIN I   COLPOSCOPY  08/30/2004   ECC--atypia   COLPOSCOPY N/A 01/21/2016   Procedure: COLPOSCOPY with vaginal biopsy with CO 2 Laser of Vaginal and vulvar condyloma;  Surgeon: Greta Leatherwood, MD;  Location: [facility] ORS;  Service: Gynecology;  Laterality: N/A;  [name] will be here 2/21 for 1115 case confirmed 01/16/15 - TS   HARDWARE REMOVAL Left 1992   Left hip   INGUINAL HERNIA REPAIR Bilateral    1981;  [id number]   LEFT AND RIGHT HEART CATHETERIZATION WITH CORONARY ANGIOGRAM N/A 05/18/2014   Procedure: LEFT AND RIGHT HEART CATHETERIZATION WITH CORONARY ANGIOGRAM;  Surgeon: Lucendia Rusk, MD;  Location: [facility] CATH LAB;  Service: Cardiovascular;  Laterality:  N/A;   LESION REMOVAL Right 05/12/2022   Procedure: Faustino Hook OF RIGHT CHEEK CYST;  Surgeon: Barb Bonito, MD;  Location: [facility];  Service: Plastics;  Laterality: Right;   LYMPH NODE BIOPSY  1995   NASAL SEPTUM SURGERY  2002   ORIF HIP FRACTURE Left 1990   per pt and  took out growth plate in Right knee   ROBOTIC ASSISTED TOTAL HYSTERECTOMY  02/26/2009   @[facility]    TEE WITHOUT CARDIOVERSION N/A 11/03/2017   Procedure: TRANSESOPHAGEAL ECHOCARDIOGRAM (TEE);  Surgeon: Gardenia Jump, MD;  Location: [facility] OR;  Service: Open Heart Surgery;  Laterality: N/A;   TONSILLECTOMY AND ADENOIDECTOMY  1990   TYMPANOSTOMY TUBE PLACEMENT Bilateral    x3  per pt last one 1982   VULVA /PERINEUM BIOPSY N/A 05/12/2022   Procedure: POSSIBLE VULVAR BIOPSY; POSSIBLE VAGINAL BIOPSY;  Surgeon: Suzi Essex, MD;  Location: [facility];  Service: Gynecology;  Laterality: N/A;   VULVECTOMY N/A 05/12/2022   Procedure: WIDE EXCISION VULVECTOMY;  Surgeon: Suzi Essex, MD;  Location: [facility];  Service: Gynecology;  Laterality: N/A;   VULVECTOMY N/A 08/12/2022   Procedure: WIDE LOCAL EXCISION VULVECTOMY;  Surgeon: Suzi Essex, MD;  Location: [facility];  Service: Gynecology;  Laterality: N/A;    Family History  Problem Relation Age of Onset   Hyperlipidemia Mother    Hypertension Mother    Thyroid  disease Mother    Other Mother        Pituitary tumor   Hyperlipidemia Father    Hypertension Father    Migraines Father    Skin cancer Father    Hypertension Brother    Hyperlipidemia Brother    Heart attack Paternal Uncle    Thyroid  disease Maternal Grandmother    Uterine cancer Maternal Grandmother 79   Dementia Maternal Grandmother    Thyroid  disease Maternal Grandfather    Prostate cancer Maternal Grandfather 26   Dementia Paternal Grandmother    Heart attack Paternal Grandfather    Colon cancer Paternal Great-grandmother      Social History   Socioeconomic History   Marital status: Single    Spouse name: Not on file   Number of children: Not on file   Years of education: Not on file   Highest education level: Not on file  Occupational History   Not on file  Tobacco Use   Smoking status: Every Day    Current packs/day: 0.25    Average packs/day: 0.3 packs/day for 20.0 years (5.0 ttl pk-yrs)    Types: Cigarettes   Smokeless tobacco: Never  Vaping Use   Vaping status: Never Used  Substance and Sexual Activity   Alcohol use: No    Comment: in remission since 2012   Drug use: Yes    Types: Marijuana    Comment: 08-11-22 last, per pt smokes on average 1/4oz per week   Sexual activity: Yes    Partners: Male    Birth control/protection: Surgical    Comment: Hysterectomy  Other Topics Concern   Not on file  Social History Narrative   Not on file   Social Drivers of Health   Financial Resource Strain: Low Risk  (12/10/2018)   Overall Financial Resource Strain (CARDIA)    Difficulty of Paying Living Expenses: Not hard at all  Food Insecurity: No Food Insecurity (07/07/2023)   Hunger Vital Sign    Worried About Running Out of Food in the Last Year: Never true    Ran Out of Food in the Last Year: Never true  Transportation Needs: No Transportation Needs (07/07/2023)   PRAPARE - Administrator, Civil Service (Medical): No    Lack of Transportation (Non-Medical): No  Physical Activity: Unknown (12/10/2018)   Exercise Vital Sign    Days of Exercise per Week: Patient declined    Minutes of Exercise per Session: Patient declined  Stress: Not on file  Social Connections: Unknown (12/10/2018)   Social Connection and Isolation Panel [NHANES]    Frequency of Communication with Friends and Family: Patient declined    Frequency of Social Gatherings with Friends and Family: Patient declined    Attends Religious Services: Patient declined    Database administrator or Organizations: Patient declined     Attends Engineer, structural: Patient declined    Marital Status: Patient declined    Current Medications:  Current Outpatient Medications:    albuterol  (VENTOLIN  HFA) 108 (90 Base) MCG/ACT inhaler, INHALE TWO PUFFS BY MOUTH EVERY 6 HOURS AS NEEDED FOR WHEEZING OR SHORTNESS OF BREATH, Disp: 8.5 g, Rfl: 1   Albuterol -Budesonide  (AIRSUPRA ) 90-80 MCG/ACT AERO, Inhale 2 Inhalations into the lungs every 4 (  four) hours as needed., Disp: 30 g, Rfl: 5   aspirin  EC 81 MG tablet, Take 81 mg by mouth daily. Swallow whole., Disp: , Rfl:    benzonatate  (TESSALON ) 100 MG capsule, Take 1 capsule (100 mg total) by mouth every 8 (eight) hours., Disp: 21 capsule, Rfl: 0   DULoxetine  (CYMBALTA ) 20 MG capsule, TAKE 2 CAPSULES BY MOUTH DAILY, Disp: 60 capsule, Rfl: 0   gabapentin  (NEURONTIN ) 300 MG capsule, TAKE TWO CAPSULES BY MOUTH THREE TIMES A DAY, Disp: 540 capsule, Rfl: 1   HYDROcodone  bit-homatropine (HYCODAN) 5-1.5 MG/5ML syrup, Take 5 mLs by mouth every 8 (eight) hours as needed for cough., Disp: 240 mL, Rfl: 0   indapamide  (LOZOL ) 1.25 MG tablet, TAKE 1 TABLET BY MOUTH DAILY, Disp: 90 tablet, Rfl: 0   lamoTRIgine  (LAMICTAL ) 200 MG tablet, TAKE 2 TABLETS BY MOUTH DAILY, Disp: 180 tablet, Rfl: 0   meclizine  (ANTIVERT ) 12.5 MG tablet, TAKE ONE TABLET BY MOUTH TWICE A DAY AS NEEDED FOR DIZZINESS, Disp: 20 tablet, Rfl: 0   metoprolol  tartrate (LOPRESSOR ) 25 MG tablet, Take 0.5 tablets (12.5 mg total) by mouth daily. Please keep scheduled appointment for future refills. Thank you., Disp: 90 tablet, Rfl: 3   olmesartan  (BENICAR ) 20 MG tablet, TAKE 1 TABLET BY MOUTH DAILY, Disp: 90 tablet, Rfl: 0   predniSONE  (DELTASONE ) 20 MG tablet, Take 2 tablets (40 mg total) by mouth daily with breakfast., Disp: 10 tablet, Rfl: 0   rosuvastatin  (CRESTOR ) 20 MG tablet, TAKE 1 TABLET BY MOUTH DAILY, Disp: 90 tablet, Rfl: 0   tiZANidine  (ZANAFLEX ) 4 MG tablet, Take 1 tablet (4 mg total) by mouth at bedtime., Disp:  30 tablet, Rfl: 0   traMADol  (ULTRAM ) 50 MG tablet, Take 2 tablets (100 mg total) by mouth every 12 (twelve) hours as needed., Disp: 120 tablet, Rfl: 0   warfarin (COUMADIN ) 7.5 MG tablet, TAKE 1 TO 1 AND 1/2 TABLET BY MOUTH AS DIRECTED BY ANTCOAGULATION CLINIC, Disp: 40 tablet, Rfl: 1  Review of Systems: Denies appetite changes, fevers, chills, fatigue, unexplained weight changes. Denies hearing loss, neck lumps or masses, mouth sores, ringing in ears or voice changes. Denies cough or wheezing.  Denies shortness of breath. Denies chest pain or palpitations. Denies leg swelling. Denies abdominal distention, pain, blood in stools, constipation, diarrhea, nausea, vomiting, or early satiety. Denies pain with intercourse, dysuria, frequency, hematuria or incontinence. Denies hot flashes, pelvic pain, vaginal bleeding or vaginal discharge.   Denies joint pain, back pain or muscle pain/cramps. Denies itching, rash, or wounds. Denies dizziness, headaches, numbness or seizures. Denies swollen lymph nodes or glands, denies easy bruising or bleeding. Denies anxiety, depression, confusion, or decreased concentration.  Physical Exam: BP 139/78 (BP Location: Left Arm, Patient Position: Sitting)   Pulse 78   Temp 97.9 F (36.6 C) (Oral)   Resp 17   Ht 4\' 11"  (1.499 m)   Wt 114 lb (51.7 kg)   SpO2 100%   BMI 23.03 kg/m  General: Alert, oriented, no acute distress. HEENT: Normocephalic, atraumatic, sclera anicteric. Chest: Unlabored breathing on room air. GU: Hypopigmentation of a 4 x 5 cm area along the right mons, unchanged.  No areas of leukoplakia or desquamation posteriorly.  The skin along the perineum is somewhat thinned but otherwise normal in appearance.  Several hypopigmented lesions consistent with prior laser therapy.  On speculum exam, vaginal mucosa is well-rugated, no visible lesions.  No significant discharge.  STD swab collected.  Laboratory & Radiologic Studies: None  new  Assessment & Plan: Danielle Obrien is a 43 y.o. 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  Developed symptoms at the site of her wide local excision with biopsy proven VIN3 resected in 07/2022 with negative margins.  Recent Pap ASCUS, high risk HPV positive. Colposcopy with VAIN1 on biopsy  Doing well today.  Plan for follow-up visit in 6 months with repeat Pap and HPV testing at that time. STD testing performed today.  20 minutes of total time was spent for this patient encounter, including preparation, face-to-face counseling with the patient and coordination of care, and documentation of the encounter.  Wiley Hanger, MD  Division of Gynecologic Oncology  Department of Obstetrics and Gynecology  [facility]

## 2024-04-28 ENCOUNTER — Telehealth: Payer: Self-pay | Admitting: Gynecologic Oncology

## 2024-04-28 ENCOUNTER — Other Ambulatory Visit: Payer: Self-pay | Admitting: Gynecologic Oncology

## 2024-04-28 ENCOUNTER — Ambulatory Visit: Payer: Self-pay | Admitting: Gynecologic Oncology

## 2024-04-28 DIAGNOSIS — Z7251 High risk heterosexual behavior: Secondary | ICD-10-CM

## 2024-04-28 LAB — RPR
RPR Ser Ql: REACTIVE — AB
RPR Titer: 1:1 {titer}

## 2024-04-28 NOTE — Telephone Encounter (Signed)
 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  Confirmatory test has been sent and is pending.  I will update her with these results.  Discussed plan for treatment with penicillin if she has an acute syphilis infection.  Wiley Hanger MD Gynecologic Oncology

## 2024-05-01 ENCOUNTER — Other Ambulatory Visit: Payer: Self-pay | Admitting: Gynecologic Oncology

## 2024-05-01 DIAGNOSIS — Z7251 High risk heterosexual behavior: Secondary | ICD-10-CM

## 2024-05-02 LAB — GC/CHLAMYDIA PROBE AMP (~~LOC~~) NOT AT ARMC
Chlamydia: NEGATIVE
Comment: NEGATIVE
Comment: NORMAL
Neisseria Gonorrhea: NEGATIVE

## 2024-05-02 LAB — T.PALLIDUM AB, TOTAL: T Pallidum Abs: NONREACTIVE

## 2024-05-03 NOTE — Telephone Encounter (Signed)
-----   Message from Nurse Jeannetta Millman sent at 05/03/2024  3:18 PM EDT -----  ----- Message ----- From: Suzi Essex, MD Sent: 05/03/2024   7:34 AM EDT To: Louella Rout, RN  Could you please call and let her know that follow-up/confirmatory testing for syphilis was negative (she does not have syphilis)? Thank you

## 2024-05-03 NOTE — Telephone Encounter (Signed)
 Danielle Obrien is aware of negative confirmatory testing for syphillis as reported by Dr.Tucker.

## 2024-05-03 NOTE — Progress Notes (Signed)
 Could you please call and let her know that follow-up/confirmatory testing for syphilis was negative (she does not have syphilis)? Thank you

## 2024-05-05 ENCOUNTER — Ambulatory Visit: Attending: Cardiology

## 2024-05-05 DIAGNOSIS — Q2381 Bicuspid aortic valve: Secondary | ICD-10-CM

## 2024-05-05 DIAGNOSIS — Z954 Presence of other heart-valve replacement: Secondary | ICD-10-CM

## 2024-05-05 DIAGNOSIS — I350 Nonrheumatic aortic (valve) stenosis: Secondary | ICD-10-CM | POA: Diagnosis not present

## 2024-05-05 DIAGNOSIS — Z7901 Long term (current) use of anticoagulants: Secondary | ICD-10-CM | POA: Diagnosis not present

## 2024-05-05 LAB — POCT INR: INR: 3.6 — AB (ref 2.0–3.0)

## 2024-05-05 NOTE — Patient Instructions (Signed)
*  PLEASE PUT ON APPT NO ONSITE INTERPRETER NEEDED*  Hold tomorrow morning then Continue taking warfarin 1 tablet daily except for 1.5 tablets on Sundays, Tuesdays and Thursdays Surgery 6/24 Recheck INR in 2 weeks.  Coumadin  Clinic [phone]

## 2024-05-05 NOTE — Telephone Encounter (Signed)
 I was added onto a secure chat by Bambi Lever, RN coumadin  nurse. Secure chat was started by Wendell Halt in regard to pt needing a tele appt for preop clearance.   Call was not handed over to the preop team. Camilo Cella scheduled the tele preop appt. I did note in the secure chat that the call should had come to the preop team as there is a protocol we have to follow; medications need to be reviewed as well as we need to obtain consent and document all of this.   I will call the pt now to obtain consent and review her medications.   I left a detailed message for the pt that I apologize for the call today now by the preop team, however the call should had come to the preop team originally. see notes above.   I left message to call back to preop to review her med list as well as to obtain official consent and document.

## 2024-05-07 ENCOUNTER — Other Ambulatory Visit: Payer: Self-pay | Admitting: Family Medicine

## 2024-05-08 ENCOUNTER — Encounter: Payer: Self-pay | Admitting: Surgical

## 2024-05-08 ENCOUNTER — Telehealth: Payer: Self-pay

## 2024-05-08 ENCOUNTER — Ambulatory Visit: Admitting: Surgical

## 2024-05-08 VITALS — BP 133/78 | HR 69 | Ht 59.5 in | Wt 114.6 lb

## 2024-05-08 DIAGNOSIS — L989 Disorder of the skin and subcutaneous tissue, unspecified: Secondary | ICD-10-CM

## 2024-05-08 DIAGNOSIS — D489 Neoplasm of uncertain behavior, unspecified: Secondary | ICD-10-CM

## 2024-05-08 NOTE — Telephone Encounter (Signed)
 Pt returning nurse call and would like a c/b after 12. Please advise

## 2024-05-08 NOTE — Telephone Encounter (Signed)
 Danielle Obrien, [facility] with [facility] sent me a secure chat and asked if we can also provide recommendations for ASA as well Warfarin.   I assured PAC that I will update the preop APP for the tele visit on 05/12/24.

## 2024-05-08 NOTE — H&P (View-Only) (Signed)
 Patient ID: [facility] RUNKEL, female    DOB: June 18, 1981, 43 y.o.   MRN: [id number]  Chief Complaint  Patient presents with   Pre-op Exam      ICD-10-CM   1. Skin lesion  L98.9     2. Neoplasm, uncertain whether benign or malignant  D48.9       History of Present Illness: Danielle Obrien is a 43 y.o.  female  with a history of right upper lip skin lesion.  She presents for preoperative evaluation for upcoming procedure, excision of right upper lip skin lesion, scheduled for 05/23/2024 with Dr. Carolynne Citron.  The patient has not had problems with anesthesia. No history of DVT/PE.  No family history of DVT/PE.  No family or personal history of bleeding or clotting disorders. No history of CVA/MI.   Summary of Previous Visit: Patient with skin lesion on right side of upper lip.  Reports it started out as a small pimple and has rapidly grown over the past few years.  Job: Works at [employer], discussed with Work.  PMH Significant for: Mechanical aortic valve, on warfarin.  Thrombocytopenia.  Hypertension.  Mild persistent asthma.  Cervical degenerative disc disease, hyperlipidemia.  GERD.  Patient with CBC 2 months ago, hemoglobin 15.1, platelets 149.  She reports she is allergic to Dermabond  Patient reports her asthma is well-controlled at this time, reports she does not need to use her inhaler much.  She reports asthma is usually worse in the allergy season.  She is doing well now without any issues at this time.  She reports she takes tramadol  for chronic pain of her left hip and knee.  Cardiac clearance previously sent due to patient being on warfarin and having mechanical heart valve.  She reports she has a follow-up with cardiology today to further discuss clearance.  She is going to have an additional appointment with cardiology to discuss clearance prior to surgery.  She denies any recent chest pain or shortness of breath.  She reports she has been feeling well.  Past  Medical History: Allergies: Allergies  Allergen Reactions   Cyanoacrylate    Demerol  Nausea And Vomiting   Ibuprofen Other (See Comments)    Gastritis    Wound Dressing Adhesive Rash    Dermabond allergy     Current Medications:  Current Outpatient Medications:    albuterol  (VENTOLIN  HFA) 108 (90 Base) MCG/ACT inhaler, INHALE TWO PUFFS BY MOUTH EVERY 6 HOURS AS NEEDED FOR WHEEZING OR SHORTNESS OF BREATH, Disp: 8.5 g, Rfl: 1   Albuterol -Budesonide  (AIRSUPRA ) 90-80 MCG/ACT AERO, Inhale 2 Inhalations into the lungs every 4 (four) hours as needed., Disp: 30 g, Rfl: 5   aspirin  EC 81 MG tablet, Take 81 mg by mouth daily. Swallow whole., Disp: , Rfl:    benzonatate  (TESSALON ) 100 MG capsule, Take 1 capsule (100 mg total) by mouth every 8 (eight) hours., Disp: 21 capsule, Rfl: 0   DULoxetine  (CYMBALTA ) 20 MG capsule, TAKE 2 CAPSULES BY MOUTH DAILY, Disp: 60 capsule, Rfl: 0   gabapentin  (NEURONTIN ) 300 MG capsule, TAKE TWO CAPSULES BY MOUTH THREE TIMES A DAY, Disp: 540 capsule, Rfl: 1   lamoTRIgine  (LAMICTAL ) 200 MG tablet, TAKE 2 TABLETS BY MOUTH DAILY, Disp: 180 tablet, Rfl: 0   meclizine  (ANTIVERT ) 12.5 MG tablet, TAKE ONE TABLET BY MOUTH TWICE A DAY AS NEEDED FOR DIZZINESS, Disp: 20 tablet, Rfl: 0   metoprolol  tartrate (LOPRESSOR ) 25 MG tablet, Take 0.5 tablets (12.5 mg total)  by mouth daily. Please keep scheduled appointment for future refills. Thank you., Disp: 90 tablet, Rfl: 3   rosuvastatin  (CRESTOR ) 20 MG tablet, TAKE 1 TABLET BY MOUTH DAILY, Disp: 90 tablet, Rfl: 0   tiZANidine  (ZANAFLEX ) 4 MG tablet, Take 1 tablet (4 mg total) by mouth at bedtime., Disp: 30 tablet, Rfl: 0   traMADol  (ULTRAM ) 50 MG tablet, Take 2 tablets (100 mg total) by mouth every 12 (twelve) hours as needed., Disp: 120 tablet, Rfl: 0   warfarin (COUMADIN ) 7.5 MG tablet, TAKE 1 TO 1 AND 1/2 TABLET BY MOUTH AS DIRECTED BY ANTCOAGULATION CLINIC, Disp: 40 tablet, Rfl: 1   HYDROcodone  bit-homatropine (HYCODAN) 5-1.5  MG/5ML syrup, Take 5 mLs by mouth every 8 (eight) hours as needed for cough. (Patient not taking: Reported on 05/08/2024), Disp: 240 mL, Rfl: 0   indapamide  (LOZOL ) 1.25 MG tablet, TAKE 1 TABLET BY MOUTH DAILY (Patient not taking: Reported on 05/08/2024), Disp: 90 tablet, Rfl: 0   olmesartan  (BENICAR ) 20 MG tablet, TAKE 1 TABLET BY MOUTH DAILY (Patient not taking: Reported on 05/08/2024), Disp: 90 tablet, Rfl: 0   predniSONE  (DELTASONE ) 20 MG tablet, Take 2 tablets (40 mg total) by mouth daily with breakfast. (Patient not taking: Reported on 05/08/2024), Disp: 10 tablet, Rfl: 0  Past Medical Problems: Past Medical History:  Diagnosis Date   ADD (attention deficit disorder)    Alcohol abuse, in remission 2012   per pt in remission since 2012   Anticoagulated on Coumadin     coumadin --- managed by cardiology/ coumadin  clinic   Arthritis    In hips   Asthma    allergy induced, rare inhaler use   Auditory processing disorder    per pt very HOH, first language ASL   Bipolar disorder (HCC)    Chronic left hip pain    Chronic vertigo    Complication of anesthesia    per pt due to auditory processing disorder pt is unable to hear when waking up and also gets very anxious   Family history of uterine cancer    GAD (generalized anxiety disorder)    GERD (gastroesophageal reflux disease)    History of cervical dysplasia    History of condyloma acuminatum 2017   s/p laser ablation vulva   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   biopsy and CO2 laser ablation   HOH (hard of hearing)    per pt very HOH due to auditroy processing disorder   Hyperlipidemia    Hypertension    Muscle spasms of both lower extremities    both hips   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   23 mm Sorin [employer] [place] bileaflet mechanical valve via right anterior mini thoracotomy   Vulvar dysplasia     Past Surgical History: Past Surgical History:  Procedure  Laterality Date   ANTERIOR CRUCIATE LIGAMENT REPAIR  1993   AORTIC VALVE REPLACEMENT N/A 11/03/2017   Procedure: MINIMALLY INVASIVE AORTIC VALVE REPLACEMENT( MINI THORACOTOMY);  Surgeon: Gardenia Jump, MD;  Location: [facility] OR;  Service: Open Heart Surgery;  Laterality: N/A;   CERVICAL BIOPSY  W/ LOOP ELECTRODE EXCISION  2010   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 ECC   COLPOSCOPY  06/30/2001   CIN I   COLPOSCOPY  08/30/2004   ECC--atypia   COLPOSCOPY N/A 01/21/2016   Procedure: COLPOSCOPY with vaginal biopsy with CO 2 Laser of Vaginal and vulvar condyloma;  Surgeon: Greta Leatherwood, MD;  Location: [facility] ORS;  Service: Gynecology;  Laterality: N/A;  [name] will be here 2/21 for 1115 case confirmed 01/16/15 - TS   HARDWARE REMOVAL Left 1992   Left hip   INGUINAL HERNIA REPAIR Bilateral    1981;  [id number]   LEFT AND RIGHT HEART CATHETERIZATION WITH CORONARY ANGIOGRAM N/A 05/18/2014   Procedure: LEFT AND RIGHT HEART CATHETERIZATION WITH CORONARY ANGIOGRAM;  Surgeon: Lucendia Rusk, MD;  Location: [facility] CATH LAB;  Service: Cardiovascular;  Laterality: N/A;   LESION REMOVAL Right 05/12/2022   Procedure: Faustino Hook OF RIGHT CHEEK CYST;  Surgeon: Barb Bonito, MD;  Location: [facility];  Service: Plastics;  Laterality: Right;   LYMPH NODE BIOPSY  1995   NASAL SEPTUM SURGERY  2002   ORIF HIP FRACTURE Left 1990   per pt and  took out growth plate in Right knee   ROBOTIC  ASSISTED TOTAL HYSTERECTOMY  02/26/2009   @[facility]    TEE WITHOUT CARDIOVERSION N/A 11/03/2017   Procedure: TRANSESOPHAGEAL ECHOCARDIOGRAM (TEE);  Surgeon: Gardenia Jump, MD;  Location: [facility] OR;  Service: Open Heart Surgery;  Laterality: N/A;   TONSILLECTOMY AND ADENOIDECTOMY  1990   TYMPANOSTOMY TUBE PLACEMENT Bilateral    x3  per pt last one 1982   VULVA /PERINEUM BIOPSY N/A 05/12/2022   Procedure: POSSIBLE VULVAR BIOPSY; POSSIBLE VAGINAL BIOPSY;  Surgeon: Suzi Essex, MD;  Location: [facility];  Service: Gynecology;  Laterality: N/A;   VULVECTOMY N/A 05/12/2022   Procedure: WIDE EXCISION VULVECTOMY;  Surgeon: Suzi Essex, MD;  Location: [facility];  Service: Gynecology;  Laterality: N/A;   VULVECTOMY N/A 08/12/2022   Procedure: WIDE LOCAL EXCISION VULVECTOMY;  Surgeon: Suzi Essex, MD;  Location: [facility];  Service: Gynecology;  Laterality: N/A;    Social History: Social History   Socioeconomic History   Marital status: Single    Spouse name: Not on file   Number of children: Not on file   Years of education: Not on file   Highest education level: Not on file  Occupational History   Not on file  Tobacco Use   Smoking status: Every Day    Current packs/day: 0.25    Average packs/day: 0.3 packs/day for 20.0 years (5.0 ttl pk-yrs)    Types: Cigarettes   Smokeless tobacco: Never  Vaping Use   Vaping status: Never Used  Substance and Sexual Activity   Alcohol use: No    Comment: in remission since 2012   Drug use: Yes    Types: Marijuana    Comment: 08-11-22 last, per pt smokes on average 1/4oz per week   Sexual activity: Yes    Partners: Male    Birth control/protection: Surgical    Comment: Hysterectomy  Other Topics Concern   Not on file  Social History Narrative   Not on file   Social Drivers of Health   Financial Resource Strain: Low Risk  (12/10/2018)   Overall Financial  Resource Strain (CARDIA)     Difficulty of Paying Living Expenses: Not hard at all  Food Insecurity: No Food Insecurity (07/07/2023)   Hunger Vital Sign    Worried About Running Out of Food in the Last Year: Never true    Ran Out of Food in the Last Year: Never true  Transportation Needs: No Transportation Needs (07/07/2023)   PRAPARE - Administrator, Civil Service (Medical): No    Lack of Transportation (Non-Medical): No  Physical Activity: Unknown (12/10/2018)   Exercise Vital Sign    Days of Exercise per Week: Patient declined    Minutes of Exercise per Session: Patient declined  Stress: Not on file  Social Connections: Unknown (12/10/2018)   Social Connection and Isolation Panel [NHANES]    Frequency of Communication with Friends and Family: Patient declined    Frequency of Social Gatherings with Friends and Family: Patient declined    Attends Religious Services: Patient declined    Database administrator or Organizations: Patient declined    Attends Banker Meetings: Patient declined    Marital Status: Patient declined  Intimate Partner Violence: Not At Risk (07/07/2023)   Humiliation, Afraid, Rape, and Kick questionnaire    Fear of Current or Ex-Partner: No    Emotionally Abused: No    Physically Abused: No    Sexually Abused: No    Family History: Family History  Problem Relation Age of Onset   Hyperlipidemia Mother    Hypertension Mother    Thyroid  disease Mother    Other Mother        Pituitary tumor   Hyperlipidemia Father    Hypertension Father    Migraines Father    Skin cancer Father    Hypertension Brother    Hyperlipidemia Brother    Heart attack Paternal Uncle    Thyroid  disease Maternal Grandmother    Uterine cancer Maternal Grandmother 47   Dementia Maternal Grandmother    Thyroid  disease Maternal Grandfather    Prostate cancer Maternal Grandfather 19   Dementia Paternal Grandmother    Heart attack Paternal Grandfather    Colon cancer Paternal  Great-grandmother     Review of Systems: Review of Systems  Constitutional: Negative.   Respiratory: Negative.    Cardiovascular: Negative.   Gastrointestinal: Negative.   Neurological: Negative.     Physical Exam: Vital Signs BP 133/78 (BP Location: Left Arm, Patient Position: Sitting, Cuff Size: Normal)   Pulse 69   Ht 4' 11.5" (1.511 m)   Wt 114 lb 9.6 oz (52 kg)   SpO2 97%   BMI 22.76 kg/m   Physical Exam Constitutional:      General: Not in acute distress.    Appearance: Normal appearance. Not ill-appearing.  HENT:     Head: Normocephalic and atraumatic.  Eyes:     Pupils: Pupils are equal, round Neck:     Musculoskeletal: Normal range of motion.  Cardiovascular:     Rate and Rhythm: Normal rate    Pulses: Normal pulses.  Pulmonary:     Effort: Pulmonary effort is normal. No respiratory distress.  Abdominal:     General: Abdomen is flat. There is no distension.  Musculoskeletal: Normal range of motion.  Skin:    General: Skin is warm and dry.     Findings: No erythema or rash.  Neurological:     General: No focal deficit present.     Mental Status: Alert and oriented to person, place, and time. Mental  status is at baseline.     Motor: No weakness.  Psychiatric:        Mood and Affect: Mood normal.        Behavior: Behavior normal.    Assessment/Plan: The patient is scheduled for excision of right upper lip skin lesion with Dr. Carolynne Citron.  Risks, benefits, and alternatives of procedure discussed, questions answered and consent obtained.    Smoking Status: Smokes about 9 cigarettes/day; Counseling Given?  Discussed increased risk of complications related to nicotine  use.  Caprini Score: 8, high; Risk Factors include: Age, history of varicose veins, lower extremity swelling, smoker and length of planned surgery. Recommendation for mechanical prophylaxis. Encourage early ambulation.  Patient will restart warfarin postoperatively.  Pictures obtained:  @consult   Post-op Rx sent to pharmacy: No prescription sent at this time.  Will need to obtain clearance from patient's orthopedic provider Dr. Felipe Horton in regards to narcotic prescriptions to ensure she does not have a pain contract for her tramadol .  Patient was provided with the General Surgical Risk consent document and Pain Medication Agreement prior to their appointment.  They had adequate time to read through the risk consent documents and Pain Medication Agreement. We also discussed them in person together during this preop appointment. All of their questions were answered to their satisfaction.  Recommended calling if they have any further questions.  Risk consent form and Pain Medication Agreement to be scanned into patient's chart.  Cardiac clearance has been sent, patient has cardiac appointment 05/12/2024 to further discuss holding warfarin prior to procedure as well as potentially holding aspirin  81 mg.  Sign language interpreter was present throughout the entire encounter.  Electronically signed by: Janalyn Me Jermiya Reichl, PA-C 05/08/2024 3:49 PM

## 2024-05-08 NOTE — Telephone Encounter (Signed)
 Spoke with pt and got her scheduled with Slater Duncan, NP on 05/12/24. Med rec and consent done.    In office visit on 06/17 with Marcie Sever, PA was cancelled as appt was to be tele visit per pre-op app. D/w pt if wanted to keep in office or set up tele. Pt opted for tele.

## 2024-05-08 NOTE — Progress Notes (Signed)
 Patient ID: [id number] RUNKEL, female    DOB: June 18, 1981, 43 y.o.   MRN: [id number]  Chief Complaint  Patient presents with   Pre-op Exam      ICD-10-CM   1. Skin lesion  L98.9     2. Neoplasm, uncertain whether benign or malignant  D48.9       History of Present Illness: Danielle Obrien is a 43 y.o.  female  with a history of right upper lip skin lesion.  She presents for preoperative evaluation for upcoming procedure, excision of right upper lip skin lesion, scheduled for 05/23/2024 with Dr. Carolynne Citron.  The patient has not had problems with anesthesia. No history of DVT/PE.  No family history of DVT/PE.  No family or personal history of bleeding or clotting disorders. No history of CVA/MI.   Summary of Previous Visit: Patient with skin lesion on right side of upper lip.  Reports it started out as a small pimple and has rapidly grown over the past few years.  Job: Works at [employer], discussed with Work.  PMH Significant for: Mechanical aortic valve, on warfarin.  Thrombocytopenia.  Hypertension.  Mild persistent asthma.  Cervical degenerative disc disease, hyperlipidemia.  GERD.  Patient with CBC 2 months ago, hemoglobin 15.1, platelets 149.  She reports she is allergic to Dermabond  Patient reports her asthma is well-controlled at this time, reports she does not need to use her inhaler much.  She reports asthma is usually worse in the allergy season.  She is doing well now without any issues at this time.  She reports she takes tramadol  for chronic pain of her left hip and knee.  Cardiac clearance previously sent due to patient being on warfarin and having mechanical heart valve.  She reports she has a follow-up with cardiology today to further discuss clearance.  She is going to have an additional appointment with cardiology to discuss clearance prior to surgery.  She denies any recent chest pain or shortness of breath.  She reports she has been feeling well.  Past  Medical History: Allergies: Allergies  Allergen Reactions   Cyanoacrylate    Demerol  Nausea And Vomiting   Ibuprofen Other (See Comments)    Gastritis    Wound Dressing Adhesive Rash    Dermabond allergy     Current Medications:  Current Outpatient Medications:    albuterol  (VENTOLIN  HFA) 108 (90 Base) MCG/ACT inhaler, INHALE TWO PUFFS BY MOUTH EVERY 6 HOURS AS NEEDED FOR WHEEZING OR SHORTNESS OF BREATH, Disp: 8.5 g, Rfl: 1   Albuterol -Budesonide  (AIRSUPRA ) 90-80 MCG/ACT AERO, Inhale 2 Inhalations into the lungs every 4 (four) hours as needed., Disp: 30 g, Rfl: 5   aspirin  EC 81 MG tablet, Take 81 mg by mouth daily. Swallow whole., Disp: , Rfl:    benzonatate  (TESSALON ) 100 MG capsule, Take 1 capsule (100 mg total) by mouth every 8 (eight) hours., Disp: 21 capsule, Rfl: 0   DULoxetine  (CYMBALTA ) 20 MG capsule, TAKE 2 CAPSULES BY MOUTH DAILY, Disp: 60 capsule, Rfl: 0   gabapentin  (NEURONTIN ) 300 MG capsule, TAKE TWO CAPSULES BY MOUTH THREE TIMES A DAY, Disp: 540 capsule, Rfl: 1   lamoTRIgine  (LAMICTAL ) 200 MG tablet, TAKE 2 TABLETS BY MOUTH DAILY, Disp: 180 tablet, Rfl: 0   meclizine  (ANTIVERT ) 12.5 MG tablet, TAKE ONE TABLET BY MOUTH TWICE A DAY AS NEEDED FOR DIZZINESS, Disp: 20 tablet, Rfl: 0   metoprolol  tartrate (LOPRESSOR ) 25 MG tablet, Take 0.5 tablets (12.5 mg total)  by mouth daily. Please keep scheduled appointment for future refills. Thank you., Disp: 90 tablet, Rfl: 3   rosuvastatin  (CRESTOR ) 20 MG tablet, TAKE 1 TABLET BY MOUTH DAILY, Disp: 90 tablet, Rfl: 0   tiZANidine  (ZANAFLEX ) 4 MG tablet, Take 1 tablet (4 mg total) by mouth at bedtime., Disp: 30 tablet, Rfl: 0   traMADol  (ULTRAM ) 50 MG tablet, Take 2 tablets (100 mg total) by mouth every 12 (twelve) hours as needed., Disp: 120 tablet, Rfl: 0   warfarin (COUMADIN ) 7.5 MG tablet, TAKE 1 TO 1 AND 1/2 TABLET BY MOUTH AS DIRECTED BY ANTCOAGULATION CLINIC, Disp: 40 tablet, Rfl: 1   HYDROcodone  bit-homatropine (HYCODAN) 5-1.5  MG/5ML syrup, Take 5 mLs by mouth every 8 (eight) hours as needed for cough. (Patient not taking: Reported on 05/08/2024), Disp: 240 mL, Rfl: 0   indapamide  (LOZOL ) 1.25 MG tablet, TAKE 1 TABLET BY MOUTH DAILY (Patient not taking: Reported on 05/08/2024), Disp: 90 tablet, Rfl: 0   olmesartan  (BENICAR ) 20 MG tablet, TAKE 1 TABLET BY MOUTH DAILY (Patient not taking: Reported on 05/08/2024), Disp: 90 tablet, Rfl: 0   predniSONE  (DELTASONE ) 20 MG tablet, Take 2 tablets (40 mg total) by mouth daily with breakfast. (Patient not taking: Reported on 05/08/2024), Disp: 10 tablet, Rfl: 0  Past Medical Problems: Past Medical History:  Diagnosis Date   ADD (attention deficit disorder)    Alcohol abuse, in remission 2012   per pt in remission since 2012   Anticoagulated on Coumadin     coumadin --- managed by cardiology/ coumadin  clinic   Arthritis    In hips   Asthma    allergy induced, rare inhaler use   Auditory processing disorder    per pt very HOH, first language ASL   Bipolar disorder (HCC)    Chronic left hip pain    Chronic vertigo    Complication of anesthesia    per pt due to auditory processing disorder pt is unable to hear when waking up and also gets very anxious   Family history of uterine cancer    GAD (generalized anxiety disorder)    GERD (gastroesophageal reflux disease)    History of cervical dysplasia    History of condyloma acuminatum 2017   s/p laser ablation vulva   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   biopsy and CO2 laser ablation   HOH (hard of hearing)    per pt very HOH due to auditroy processing disorder   Hyperlipidemia    Hypertension    Muscle spasms of both lower extremities    both hips   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   23 mm Sorin [employer] [place] bileaflet mechanical valve via right anterior mini thoracotomy   Vulvar dysplasia     Past Surgical History: Past Surgical History:  Procedure  Laterality Date   ANTERIOR CRUCIATE LIGAMENT REPAIR  1993   AORTIC VALVE REPLACEMENT N/A 11/03/2017   Procedure: MINIMALLY INVASIVE AORTIC VALVE REPLACEMENT( MINI THORACOTOMY);  Surgeon: Gardenia Jump, MD;  Location: [facility] OR;  Service: Open Heart Surgery;  Laterality: N/A;   CERVICAL BIOPSY  W/ LOOP ELECTRODE EXCISION  2010   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 ECC   COLPOSCOPY  06/30/2001   CIN I   COLPOSCOPY  08/30/2004   ECC--atypia   COLPOSCOPY N/A 01/21/2016   Procedure: COLPOSCOPY with vaginal biopsy with CO 2 Laser of Vaginal and vulvar condyloma;  Surgeon: Greta Leatherwood, MD;  Location: [facility] ORS;  Service: Gynecology;  Laterality: N/A;  [name] will be here 2/21 for 1115 case confirmed 01/16/15 - TS   HARDWARE REMOVAL Left 1992   Left hip   INGUINAL HERNIA REPAIR Bilateral    1981;  [id number]   LEFT AND RIGHT HEART CATHETERIZATION WITH CORONARY ANGIOGRAM N/A 05/18/2014   Procedure: LEFT AND RIGHT HEART CATHETERIZATION WITH CORONARY ANGIOGRAM;  Surgeon: Lucendia Rusk, MD;  Location: [facility] CATH LAB;  Service: Cardiovascular;  Laterality: N/A;   LESION REMOVAL Right 05/12/2022   Procedure: Faustino Hook OF RIGHT CHEEK CYST;  Surgeon: Barb Bonito, MD;  Location: [facility];  Service: Plastics;  Laterality: Right;   LYMPH NODE BIOPSY  1995   NASAL SEPTUM SURGERY  2002   ORIF HIP FRACTURE Left 1990   per pt and  took out growth plate in Right knee   ROBOTIC  ASSISTED TOTAL HYSTERECTOMY  02/26/2009   @[facility]    TEE WITHOUT CARDIOVERSION N/A 11/03/2017   Procedure: TRANSESOPHAGEAL ECHOCARDIOGRAM (TEE);  Surgeon: Gardenia Jump, MD;  Location: [facility] OR;  Service: Open Heart Surgery;  Laterality: N/A;   TONSILLECTOMY AND ADENOIDECTOMY  1990   TYMPANOSTOMY TUBE PLACEMENT Bilateral    x3  per pt last one 1982   VULVA /PERINEUM BIOPSY N/A 05/12/2022   Procedure: POSSIBLE VULVAR BIOPSY; POSSIBLE VAGINAL BIOPSY;  Surgeon: Suzi Essex, MD;  Location: [facility];  Service: Gynecology;  Laterality: N/A;   VULVECTOMY N/A 05/12/2022   Procedure: WIDE EXCISION VULVECTOMY;  Surgeon: Suzi Essex, MD;  Location: [facility];  Service: Gynecology;  Laterality: N/A;   VULVECTOMY N/A 08/12/2022   Procedure: WIDE LOCAL EXCISION VULVECTOMY;  Surgeon: Suzi Essex, MD;  Location: [facility];  Service: Gynecology;  Laterality: N/A;    Social History: Social History   Socioeconomic History   Marital status: Single    Spouse name: Not on file   Number of children: Not on file   Years of education: Not on file   Highest education level: Not on file  Occupational History   Not on file  Tobacco Use   Smoking status: Every Day    Current packs/day: 0.25    Average packs/day: 0.3 packs/day for 20.0 years (5.0 ttl pk-yrs)    Types: Cigarettes   Smokeless tobacco: Never  Vaping Use   Vaping status: Never Used  Substance and Sexual Activity   Alcohol use: No    Comment: in remission since 2012   Drug use: Yes    Types: Marijuana    Comment: 08-11-22 last, per pt smokes on average 1/4oz per week   Sexual activity: Yes    Partners: Male    Birth control/protection: Surgical    Comment: Hysterectomy  Other Topics Concern   Not on file  Social History Narrative   Not on file   Social Drivers of Health   Financial Resource Strain: Low Risk  (12/10/2018)   Overall Financial  Resource Strain (CARDIA)     Difficulty of Paying Living Expenses: Not hard at all  Food Insecurity: No Food Insecurity (07/07/2023)   Hunger Vital Sign    Worried About Running Out of Food in the Last Year: Never true    Ran Out of Food in the Last Year: Never true  Transportation Needs: No Transportation Needs (07/07/2023)   PRAPARE - Administrator, Civil Service (Medical): No    Lack of Transportation (Non-Medical): No  Physical Activity: Unknown (12/10/2018)   Exercise Vital Sign    Days of Exercise per Week: Patient declined    Minutes of Exercise per Session: Patient declined  Stress: Not on file  Social Connections: Unknown (12/10/2018)   Social Connection and Isolation Panel [NHANES]    Frequency of Communication with Friends and Family: Patient declined    Frequency of Social Gatherings with Friends and Family: Patient declined    Attends Religious Services: Patient declined    Database administrator or Organizations: Patient declined    Attends Banker Meetings: Patient declined    Marital Status: Patient declined  Intimate Partner Violence: Not At Risk (07/07/2023)   Humiliation, Afraid, Rape, and Kick questionnaire    Fear of Current or Ex-Partner: No    Emotionally Abused: No    Physically Abused: No    Sexually Abused: No    Family History: Family History  Problem Relation Age of Onset   Hyperlipidemia Mother    Hypertension Mother    Thyroid  disease Mother    Other Mother        Pituitary tumor   Hyperlipidemia Father    Hypertension Father    Migraines Father    Skin cancer Father    Hypertension Brother    Hyperlipidemia Brother    Heart attack Paternal Uncle    Thyroid  disease Maternal Grandmother    Uterine cancer Maternal Grandmother 47   Dementia Maternal Grandmother    Thyroid  disease Maternal Grandfather    Prostate cancer Maternal Grandfather 19   Dementia Paternal Grandmother    Heart attack Paternal Grandfather    Colon cancer Paternal  Great-grandmother     Review of Systems: Review of Systems  Constitutional: Negative.   Respiratory: Negative.    Cardiovascular: Negative.   Gastrointestinal: Negative.   Neurological: Negative.     Physical Exam: Vital Signs BP 133/78 (BP Location: Left Arm, Patient Position: Sitting, Cuff Size: Normal)   Pulse 69   Ht 4' 11.5" (1.511 m)   Wt 114 lb 9.6 oz (52 kg)   SpO2 97%   BMI 22.76 kg/m   Physical Exam Constitutional:      General: Not in acute distress.    Appearance: Normal appearance. Not ill-appearing.  HENT:     Head: Normocephalic and atraumatic.  Eyes:     Pupils: Pupils are equal, round Neck:     Musculoskeletal: Normal range of motion.  Cardiovascular:     Rate and Rhythm: Normal rate    Pulses: Normal pulses.  Pulmonary:     Effort: Pulmonary effort is normal. No respiratory distress.  Abdominal:     General: Abdomen is flat. There is no distension.  Musculoskeletal: Normal range of motion.  Skin:    General: Skin is warm and dry.     Findings: No erythema or rash.  Neurological:     General: No focal deficit present.     Mental Status: Alert and oriented to person, place, and time. Mental  status is at baseline.     Motor: No weakness.  Psychiatric:        Mood and Affect: Mood normal.        Behavior: Behavior normal.    Assessment/Plan: The patient is scheduled for excision of right upper lip skin lesion with Dr. Carolynne Citron.  Risks, benefits, and alternatives of procedure discussed, questions answered and consent obtained.    Smoking Status: Smokes about 9 cigarettes/day; Counseling Given?  Discussed increased risk of complications related to nicotine  use.  Caprini Score: 8, high; Risk Factors include: Age, history of varicose veins, lower extremity swelling, smoker and length of planned surgery. Recommendation for mechanical prophylaxis. Encourage early ambulation.  Patient will restart warfarin postoperatively.  Pictures obtained:  @consult   Post-op Rx sent to pharmacy: No prescription sent at this time.  Will need to obtain clearance from patient's orthopedic provider Dr. Felipe Horton in regards to narcotic prescriptions to ensure she does not have a pain contract for her tramadol .  Patient was provided with the General Surgical Risk consent document and Pain Medication Agreement prior to their appointment.  They had adequate time to read through the risk consent documents and Pain Medication Agreement. We also discussed them in person together during this preop appointment. All of their questions were answered to their satisfaction.  Recommended calling if they have any further questions.  Risk consent form and Pain Medication Agreement to be scanned into patient's chart.  Cardiac clearance has been sent, patient has cardiac appointment 05/12/2024 to further discuss holding warfarin prior to procedure as well as potentially holding aspirin  81 mg.  Sign language interpreter was present throughout the entire encounter.  Electronically signed by: Janalyn Me Jermiya Reichl, PA-C 05/08/2024 3:49 PM

## 2024-05-08 NOTE — Telephone Encounter (Signed)
 Spoke with pt and got her scheduled with Slater Duncan, NP on 05/12/24. Med rec and consent done.    In office visit on 06/17 with Marcie Sever, PA was cancelled as appt was to be tele visit per pre-op app. D/w pt if wanted to keep in office or set up tele. Pt opted for tele.      Patient Consent for Virtual Visit        Danielle Obrien has provided verbal consent on 05/08/2024 for a virtual visit (video or telephone).   CONSENT FOR VIRTUAL VISIT FOR:  Danielle Obrien  By participating in this virtual visit I agree to the following:  I hereby voluntarily request, consent and authorize [facility] and its employed or contracted physicians, physician assistants, nurse practitioners or other licensed health care professionals (the Practitioner), to provide me with telemedicine health care services (the "Services") as deemed necessary by the treating Practitioner. I acknowledge and consent to receive the Services by the Practitioner via telemedicine. I understand that the telemedicine visit will involve communicating with the Practitioner through live audiovisual communication technology and the disclosure of certain medical information by electronic transmission. I acknowledge that I have been given the opportunity to request an in-person assessment or other available alternative prior to the telemedicine visit and am voluntarily participating in the telemedicine visit.  I understand that I have the right to withhold or withdraw my consent to the use of telemedicine in the course of my care at any time, without affecting my right to future care or treatment, and that the Practitioner or I may terminate the telemedicine visit at any time. I understand that I have the right to inspect all information obtained and/or recorded in the course of the telemedicine visit and may receive copies of available information for a reasonable fee.  I understand that some of the potential risks of  receiving the Services via telemedicine include:  Delay or interruption in medical evaluation due to technological equipment failure or disruption; Information transmitted may not be sufficient (e.g. poor resolution of images) to allow for appropriate medical decision making by the Practitioner; and/or  In rare instances, security protocols could fail, causing a breach of personal health information.  Furthermore, I acknowledge that it is my responsibility to provide information about my medical history, conditions and care that is complete and accurate to the best of my ability. I acknowledge that Practitioner's advice, recommendations, and/or decision may be based on factors not within their control, such as incomplete or inaccurate data provided by me or distortions of diagnostic images or specimens that may result from electronic transmissions. I understand that the practice of medicine is not an exact science and that Practitioner makes no warranties or guarantees regarding treatment outcomes. I acknowledge that a copy of this consent can be made available to me via my patient portal [facility] MyChart), or I can request a printed copy by calling the office of [facility].    I understand that my insurance will be billed for this visit.   I have read or had this consent read to me. I understand the contents of this consent, which adequately explains the benefits and risks of the Services being provided via telemedicine.  I have been provided ample opportunity to ask questions regarding this consent and the Services and have had my questions answered to my satisfaction. I give my informed consent for the services to be provided through the use of telemedicine in my  medical care

## 2024-05-12 ENCOUNTER — Encounter: Payer: Self-pay | Admitting: Nurse Practitioner

## 2024-05-12 ENCOUNTER — Ambulatory Visit: Attending: Cardiovascular Disease | Admitting: Nurse Practitioner

## 2024-05-12 DIAGNOSIS — Z01810 Encounter for preprocedural cardiovascular examination: Secondary | ICD-10-CM | POA: Diagnosis not present

## 2024-05-12 NOTE — Progress Notes (Signed)
 Virtual Visit via Telephone Note   Because of Danielle Obrien co-morbid illnesses, she is at least at moderate risk for complications without adequate follow up.  This format is felt to be most appropriate for this patient at this time.  Due to technical limitations with video connection (technology), today's appointment will be conducted as an audio only telehealth visit, and Danielle Obrien verbally agreed to proceed in this manner.   All issues noted in this document were discussed and addressed.  No physical exam could be performed with this format.  Evaluation Performed:  Preoperative cardiovascular risk assessment _____________   Date:  05/12/2024   Patient ID:  Danielle Obrien, DOB 17-Dec-1980, MRN [id number] Patient Location:  Home Provider location:   Office  Primary Care Provider:  Arcadio Knuckles, MD Primary Cardiologist:  Gaylyn Keas, MD  Chief Complaint / Patient Profile   43 y.o. y/o female with a h/o bicuspid aortic valve s/p AVR with mechanical valve on chronic Coumadin  therapy, HTN, bipolar disorder, thrombocytopenia, asthma who is pending excision of facial lesion, right upper lip with Dr. Carolynne Citron on date TBD and presents today for telephonic preoperative cardiovascular risk assessment.  History of Present Illness    Danielle Obrien is a 43 y.o. female who presents via audio/video conferencing for a telehealth visit today.  Pt was last seen in cardiology clinic on 01/06/24 by Dr. Micael Adas.  At that time Danielle Obrien was having chest pain following recent admission with pneumonia. Echo was ordered but not completed.   The patient is now pending procedure as outlined above. Since her last visit, she  denies chest pain, shortness of breath, lower extremity edema, fatigue, palpitations, melena, hematuria, hemoptysis, diaphoresis, weakness, presyncope, syncope, orthopnea, and PND. She reports no further episodes of chest pain which she thinks was secondary to anxiety at  time of last office visit. She is able to achieve > 4 METS activity without concerning cardiac symptoms.    Past Medical History    Past Medical History:  Diagnosis Date   ADD (attention deficit disorder)    Alcohol abuse, in remission 2012   per pt in remission since 2012   Anticoagulated on Coumadin     coumadin --- managed by cardiology/ coumadin  clinic   Arthritis    In hips   Asthma    allergy induced, rare inhaler use   Auditory processing disorder    per pt very HOH, first language ASL   Bipolar disorder (HCC)    Chronic left hip pain    Chronic vertigo    Complication of anesthesia    per pt due to auditory processing disorder pt is unable to hear when waking up and also gets very anxious   Family history of uterine cancer    GAD (generalized anxiety disorder)    GERD (gastroesophageal reflux disease)    History of cervical dysplasia    History of condyloma acuminatum 2017   s/p laser ablation vulva   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   biopsy and CO2 laser ablation   HOH (hard of hearing)    per pt very HOH due to auditroy processing disorder   Hyperlipidemia    Hypertension    Muscle spasms of both lower extremities    both hips   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   23 mm Sorin Carbomedics [place] bileaflet mechanical valve via right anterior mini thoracotomy   Vulvar dysplasia  Past Surgical History:  Procedure Laterality Date   ANTERIOR CRUCIATE LIGAMENT REPAIR  1993   AORTIC VALVE REPLACEMENT N/A 11/03/2017   Procedure: MINIMALLY INVASIVE AORTIC VALVE REPLACEMENT( MINI THORACOTOMY);  Surgeon: Gardenia Jump, MD;  Location: [facility] OR;  Service: Open Heart Surgery;  Laterality: N/A;   CERVICAL BIOPSY  W/ LOOP ELECTRODE EXCISION  2010   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;  Service: Gynecology;  Laterality: N/A;   COLPOSCOPY  10/30/2008   CIN I & II   COLPOSCOPY  07/31/2000   Neg. ECC   COLPOSCOPY  06/30/2001   CIN I   COLPOSCOPY  08/30/2004   ECC--atypia   COLPOSCOPY N/A 01/21/2016   Procedure: COLPOSCOPY with vaginal biopsy with CO 2 Laser of Vaginal and vulvar condyloma;  Surgeon: Greta Leatherwood, MD;  Location: [facility] ORS;  Service: Gynecology;  Laterality: N/A;  Danielle Obrien will be here 2/21 for 1115 case confirmed 01/16/15 - TS   HARDWARE REMOVAL Left 1992   Left hip   INGUINAL HERNIA REPAIR Bilateral    1981;  10982   LEFT AND RIGHT HEART CATHETERIZATION WITH CORONARY ANGIOGRAM N/A 05/18/2014   Procedure: LEFT AND RIGHT HEART CATHETERIZATION WITH CORONARY ANGIOGRAM;  Surgeon: Lucendia Rusk, MD;  Location: [facility] C[facility] LAB;  Service: Cardiovascular;  Laterality: N/A;   LESION REMOVAL Right 05/12/2022   Procedure: Faustino Hook OF RIGHT CHEEK CYST;  Surgeon: Barb Bonito, MD;  Location: [facility];  Service: Plastics;  Laterality: Right;   LYMPH NODE BIOPSY  1995   NASAL SEPTUM SURGERY  2002   ORIF HIP FRACTURE Left 1990   per pt and  took out growth plate in Right knee   ROBOTIC ASSISTED TOTAL HYSTERECTOMY  02/26/2009   @[facility]    TEE WITHOUT CARDIOVERSION N/A 11/03/2017   Procedure: TRANSESOPHAGEAL ECHOCARDIOGRAM (TEE);  Surgeon: Gardenia Jump, MD;  Location: [facility] OR;  Service: Open Heart Surgery;  Laterality: N/A;   TONSILLECTOMY AND ADENOIDECTOMY  1990   TYMPANOSTOMY TUBE PLACEMENT Bilateral    x3  per pt last one 1982   VULVA /PERINEUM BIOPSY N/A 05/12/2022   Procedure: POSSIBLE VULVAR BIOPSY; POSSIBLE VAGINAL BIOPSY;  Surgeon: Suzi Essex, MD;   Location: [facility];  Service: Gynecology;  Laterality: N/A;   VULVECTOMY N/A 05/12/2022   Procedure: WIDE EXCISION VULVECTOMY;  Surgeon: Suzi Essex, MD;  Location: [facility];  Service: Gynecology;  Laterality: N/A;   VULVECTOMY N/A 08/12/2022   Procedure: WIDE LOCAL EXCISION VULVECTOMY;  Surgeon: Suzi Essex, MD;  Location[facility];  Service: Gynecology;  Laterality: N/A;    Allergies  Allergies  Allergen Reactions   Cyanoacrylate    Demerol  Nausea And Vomiting   Ibuprofen Other (See Comments)    Gastritis    Wound Dressing Adhesive Rash    Dermabond allergy     Home Medications    Prior to Admission medications   Medication Sig Start Date End Date Taking? Authorizing Provider  albuterol  (VENTOLIN  HFA) 108 (90 Base) MCG/ACT inhaler INHALE TWO PUFFS BY MOUTH EVERY 6 HOURS AS NEEDED  FOR WHEEZING OR SHORTNESS OF BREATH 11/09/23   Arcadio Knuckles, MD  Albuterol -Budesonide  (AIRSUPRA ) 90-80 MCG/ACT AERO Inhale 2 Inhalations into the lungs every 4 (four) hours as needed. 11/10/23   Plotnikov, Oakley Bellman, MD  aspirin  EC 81 MG tablet Take 81 mg by mouth daily. Swallow whole.    [provider]  benzonatate  (TESSALON ) 100 MG capsule Take 1 capsule (100 mg total) by mouth every 8 (eight) hours. 01/11/24   Henson, Vickie L, NP-C  DULoxetine  (CYMBALTA ) 20 MG capsule TAKE 2 CAPSULES BY MOUTH DAILY 05/08/24   Smith, Zachary M, DO  gabapentin  (NEURONTIN ) 300 MG capsule TAKE TWO CAPSULES BY MOUTH THREE TIMES A DAY 03/30/24   Isidro Margo, DO  HYDROcodone  bit-homatropine (HYCODAN) 5-1.5 MG/5ML syrup Take 5 mLs by mouth every 8 (eight) hours as needed for cough. Patient not taking: Reported on 05/08/2024 01/11/24   Abram Abraham, NP-C  indapamide  (LOZOL ) 1.25 MG tablet TAKE 1 TABLET BY MOUTH DAILY Patient not taking: Reported on 05/08/2024 03/10/24   Arcadio Knuckles, MD  lamoTRIgine  (LAMICTAL ) 200 MG tablet TAKE 2 TABLETS BY  MOUTH DAILY 04/10/24   Arcadio Knuckles, MD  meclizine  (ANTIVERT ) 12.5 MG tablet TAKE ONE TABLET BY MOUTH TWICE A DAY AS NEEDED FOR DIZZINESS 09/19/20   Smith, Zachary M, DO  metoprolol  tartrate (LOPRESSOR ) 25 MG tablet Take 0.5 tablets (12.5 mg total) by mouth daily. Please keep scheduled appointment for future refills. Thank you. 08/05/23   Jacqueline Matsu, MD  olmesartan  (BENICAR ) 20 MG tablet TAKE 1 TABLET BY MOUTH DAILY Patient not taking: Reported on 05/08/2024 03/10/24   Arcadio Knuckles, MD  predniSONE  (DELTASONE ) 20 MG tablet Take 2 tablets (40 mg total) by mouth daily with breakfast. Patient not taking: Reported on 05/08/2024 01/11/24   Alyson Back L, NP-C  rosuvastatin  (CRESTOR ) 20 MG tablet TAKE 1 TABLET BY MOUTH DAILY 04/10/24   Arcadio Knuckles, MD  tiZANidine  (ZANAFLEX ) 4 MG tablet Take 1 tablet (4 mg total) by mouth at bedtime. 01/13/24   Isidro Margo, DO  traMADol  (ULTRAM ) 50 MG tablet Take 2 tablets (100 mg total) by mouth every 12 (twelve) hours as needed. 02/23/24   Smith, Zachary M, DO  warfarin (COUMADIN ) 7.5 MG tablet TAKE 1 TO 1 AND 1/2 TABLET BY MOUTH AS DIRECTED BY ANTCOAGULATION CLINIC 04/03/24   Jacqueline Matsu, MD    Physical Exam    Vital Signs:  Danielle Obrien does not have vital signs available for review today.  Given telephonic nature of communication, physical exam is limited. AAOx3. NAD. Normal affect.  Speech and respirations are unlabored.  Accessory Clinical Findings    None  Assessment & Plan    1.  Preoperative Cardiovascular Risk Assessment: According to the Revised Cardiac Risk Index (RCRI), her Perioperative Risk of Major Cardiac Event is (%): 0.4. Her Functional Capacity in METs is: 9.34 according to the Duke Activity Status Index (DASI). The patient is doing well from a cardiac perspective. Therefore, based on ACC/AHA guidelines, the patient would be at acceptable risk for the planned procedure without further cardiovascular testing.   The patient was  advised that if she develops new symptoms prior to surgery to contact our office to arrange for a follow-up visit, and she verbalized understanding.  Per office protocol, patient can hold warfarin for 3-5 days prior to procedure.   Patient will NOT need bridging with Lovenox  (enoxaparin ) around procedure.  A copy of this note will be  routed to requesting surgeon.  Time:   Today, I have spent 10 minutes with the patient with telehealth technology discussing medical history, symptoms, and management plan.    Gerldine Koch, NP-C  05/12/2024, 11:04 AM [address], [address] [place], [place] [address] Office [phone] Fax [phone]

## 2024-05-15 ENCOUNTER — Encounter

## 2024-05-16 ENCOUNTER — Ambulatory Visit: Admitting: Physician Assistant

## 2024-05-16 ENCOUNTER — Ambulatory Visit: Attending: Cardiology | Admitting: *Deleted

## 2024-05-16 DIAGNOSIS — I350 Nonrheumatic aortic (valve) stenosis: Secondary | ICD-10-CM

## 2024-05-16 DIAGNOSIS — Z954 Presence of other heart-valve replacement: Secondary | ICD-10-CM | POA: Diagnosis not present

## 2024-05-16 DIAGNOSIS — Z7901 Long term (current) use of anticoagulants: Secondary | ICD-10-CM

## 2024-05-16 DIAGNOSIS — Q2381 Bicuspid aortic valve: Secondary | ICD-10-CM

## 2024-05-16 LAB — POCT INR: INR: 2.7 (ref 2.0–3.0)

## 2024-05-16 NOTE — Progress Notes (Signed)
 Patient can hold warfarin for 5 days before procedure. No Lovenox  bridge required. Patient should restart Lovenox  the night of procedure or when  directed by surgeon.

## 2024-05-16 NOTE — Patient Instructions (Addendum)
 Description   *PLEASE PUT ON APPT NO ONSITE INTERPRETER NEEDED*  Continue taking warfarin 1 tablet daily except for 1.5 tablets on Sundays, Tuesdays and Thursdays Surgery 6/24 Recheck INR in 1 week post procedure.  Coumadin  Clinic [phone]    Last dose 05/18/24-05/22/24 When you resume warfarin take an extra 1/2 tablet of warfarin for 2 days then resume your normal dose.

## 2024-05-17 ENCOUNTER — Encounter (HOSPITAL_BASED_OUTPATIENT_CLINIC_OR_DEPARTMENT_OTHER): Payer: Self-pay | Admitting: Plastic Surgery

## 2024-05-17 ENCOUNTER — Other Ambulatory Visit: Payer: Self-pay

## 2024-05-17 NOTE — Progress Notes (Signed)
   05/17/24 8413  PAT Phone Screen  Is the patient taking a GLP-1 receptor agonist? No  Do You Have Diabetes? No  Do You Have Hypertension? Yes  Have You Ever Been to the ER for Asthma? No  Have You Taken Oral Steroids in the Past 3 Months? No  Do you Take Phenteramine or any Other Diet Drugs? No  Recent  Lab Work, EKG, CXR? Yes  Where was this test performed? 12/2023 EKG NSR  Do you have a history of heart problems? Yes  Cardiologist Name Turner MD primary Card- Cards clearance and coumadin  instructions on chart. (OV 05/12/24 w/ Swinyer NP)  Have you ever had tests on your heart? Yes  Results viewable: [facility] Media Tab  Any Recent Hospitalizations? No  Height 4' 11.49 (1.511 m)  Weight 52 kg  Pat Appointment Scheduled Yes (PT INR Monday 6/23 appt between 7a-11a)

## 2024-05-18 ENCOUNTER — Telehealth: Payer: Self-pay | Admitting: Family Medicine

## 2024-05-18 NOTE — Telephone Encounter (Signed)
 Pt called, is having surgery with Dr. Larraine Plush on 05/23/2024 (in [facility]). Per pt, Dr. Carolynne Citron needs a confirmation from Dr. Felipe Horton that it is OK if he needs to rx additional pain medicine due to this procedure. Pt states he has requested this because Dr. Felipe Horton is prescribing her Tramadol .

## 2024-05-22 ENCOUNTER — Encounter (HOSPITAL_BASED_OUTPATIENT_CLINIC_OR_DEPARTMENT_OTHER)
Admission: RE | Admit: 2024-05-22 | Discharge: 2024-05-22 | Disposition: A | Discharge status: Home / Self Care | Source: Ambulatory Visit | Attending: Plastic Surgery | Admitting: Plastic Surgery

## 2024-05-22 DIAGNOSIS — Z91048 Other nonmedicinal substance allergy status: Secondary | ICD-10-CM | POA: Diagnosis not present

## 2024-05-22 DIAGNOSIS — F17210 Nicotine dependence, cigarettes, uncomplicated: Secondary | ICD-10-CM | POA: Diagnosis not present

## 2024-05-22 DIAGNOSIS — J45909 Unspecified asthma, uncomplicated: Secondary | ICD-10-CM | POA: Diagnosis not present

## 2024-05-22 DIAGNOSIS — C4401 Basal cell carcinoma of skin of lip: Secondary | ICD-10-CM | POA: Diagnosis not present

## 2024-05-22 DIAGNOSIS — D696 Thrombocytopenia, unspecified: Secondary | ICD-10-CM | POA: Diagnosis not present

## 2024-05-22 DIAGNOSIS — F419 Anxiety disorder, unspecified: Secondary | ICD-10-CM | POA: Diagnosis not present

## 2024-05-22 DIAGNOSIS — F319 Bipolar disorder, unspecified: Secondary | ICD-10-CM | POA: Diagnosis not present

## 2024-05-22 DIAGNOSIS — L989 Disorder of the skin and subcutaneous tissue, unspecified: Secondary | ICD-10-CM | POA: Diagnosis present

## 2024-05-22 DIAGNOSIS — Z79899 Other long term (current) drug therapy: Secondary | ICD-10-CM | POA: Diagnosis not present

## 2024-05-22 DIAGNOSIS — G8929 Other chronic pain: Secondary | ICD-10-CM | POA: Diagnosis not present

## 2024-05-22 DIAGNOSIS — I10 Essential (primary) hypertension: Secondary | ICD-10-CM | POA: Diagnosis not present

## 2024-05-22 DIAGNOSIS — J4530 Mild persistent asthma, uncomplicated: Secondary | ICD-10-CM | POA: Diagnosis not present

## 2024-05-22 DIAGNOSIS — K219 Gastro-esophageal reflux disease without esophagitis: Secondary | ICD-10-CM | POA: Diagnosis not present

## 2024-05-22 DIAGNOSIS — Z7901 Long term (current) use of anticoagulants: Secondary | ICD-10-CM | POA: Diagnosis not present

## 2024-05-22 DIAGNOSIS — E785 Hyperlipidemia, unspecified: Secondary | ICD-10-CM | POA: Diagnosis not present

## 2024-05-22 LAB — PROTIME-INR
INR: 0.9 (ref 0.8–1.2)
Prothrombin Time: 11.9 s (ref 11.4–15.2)

## 2024-05-22 MED ORDER — CHLORHEXIDINE GLUCONATE CLOTH 2 % EX PADS: 6.0000 | MEDICATED_PAD | Freq: Once | CUTANEOUS | Status: DC

## 2024-05-23 ENCOUNTER — Other Ambulatory Visit: Payer: Self-pay

## 2024-05-23 ENCOUNTER — Ambulatory Visit (HOSPITAL_BASED_OUTPATIENT_CLINIC_OR_DEPARTMENT_OTHER)

## 2024-05-23 ENCOUNTER — Ambulatory Visit (HOSPITAL_BASED_OUTPATIENT_CLINIC_OR_DEPARTMENT_OTHER)
Admission: RE | Admit: 2024-05-23 | Discharge: 2024-05-23 | Disposition: A | Discharge status: Home / Self Care | Attending: Plastic Surgery | Admitting: Plastic Surgery

## 2024-05-23 ENCOUNTER — Encounter (HOSPITAL_BASED_OUTPATIENT_CLINIC_OR_DEPARTMENT_OTHER): Payer: Self-pay | Admitting: Plastic Surgery

## 2024-05-23 ENCOUNTER — Encounter (HOSPITAL_BASED_OUTPATIENT_CLINIC_OR_DEPARTMENT_OTHER)
Admission: RE | Disposition: A | Discharge status: Home / Self Care | Payer: Self-pay | Source: Home / Self Care | Attending: Plastic Surgery

## 2024-05-23 DIAGNOSIS — D696 Thrombocytopenia, unspecified: Secondary | ICD-10-CM | POA: Insufficient documentation

## 2024-05-23 DIAGNOSIS — F17210 Nicotine dependence, cigarettes, uncomplicated: Secondary | ICD-10-CM | POA: Insufficient documentation

## 2024-05-23 DIAGNOSIS — Z79899 Other long term (current) drug therapy: Secondary | ICD-10-CM | POA: Insufficient documentation

## 2024-05-23 DIAGNOSIS — C4401 Basal cell carcinoma of skin of lip: Secondary | ICD-10-CM | POA: Diagnosis not present

## 2024-05-23 DIAGNOSIS — L988 Other specified disorders of the skin and subcutaneous tissue: Secondary | ICD-10-CM

## 2024-05-23 DIAGNOSIS — Z7901 Long term (current) use of anticoagulants: Secondary | ICD-10-CM | POA: Insufficient documentation

## 2024-05-23 DIAGNOSIS — I10 Essential (primary) hypertension: Secondary | ICD-10-CM | POA: Insufficient documentation

## 2024-05-23 DIAGNOSIS — J4530 Mild persistent asthma, uncomplicated: Secondary | ICD-10-CM | POA: Insufficient documentation

## 2024-05-23 DIAGNOSIS — L989 Disorder of the skin and subcutaneous tissue, unspecified: Secondary | ICD-10-CM | POA: Insufficient documentation

## 2024-05-23 DIAGNOSIS — F319 Bipolar disorder, unspecified: Secondary | ICD-10-CM | POA: Insufficient documentation

## 2024-05-23 DIAGNOSIS — J45909 Unspecified asthma, uncomplicated: Secondary | ICD-10-CM | POA: Insufficient documentation

## 2024-05-23 DIAGNOSIS — F419 Anxiety disorder, unspecified: Secondary | ICD-10-CM | POA: Insufficient documentation

## 2024-05-23 DIAGNOSIS — K219 Gastro-esophageal reflux disease without esophagitis: Secondary | ICD-10-CM | POA: Insufficient documentation

## 2024-05-23 DIAGNOSIS — E785 Hyperlipidemia, unspecified: Secondary | ICD-10-CM | POA: Insufficient documentation

## 2024-05-23 DIAGNOSIS — G8929 Other chronic pain: Secondary | ICD-10-CM | POA: Insufficient documentation

## 2024-05-23 DIAGNOSIS — Z91048 Other nonmedicinal substance allergy status: Secondary | ICD-10-CM | POA: Insufficient documentation

## 2024-05-23 HISTORY — PX: LESION EXCISION WITH COMPLEX REPAIR: SHX6700

## 2024-05-23 SURGERY — LESION EXCISION WITH COMPLEX REPAIR
Anesthesia: General

## 2024-05-23 MED ORDER — OXYCODONE HCL 5 MG PO TABS: 5.0000 mg | ORAL_TABLET | Freq: Once | ORAL | Status: DC | PRN

## 2024-05-23 MED ORDER — FENTANYL CITRATE (PF) 100 MCG/2ML IJ SOLN: 25.0000 ug | INTRAMUSCULAR | Status: DC | PRN

## 2024-05-23 MED ORDER — BACITRACIN ZINC 500 UNIT/GM EX OINT
TOPICAL_OINTMENT | CUTANEOUS | Status: AC
  Filled 2024-05-23: qty 0.9

## 2024-05-23 MED ORDER — DROPERIDOL 2.5 MG/ML IJ SOLN: 0.6250 mg | Freq: Once | INTRAMUSCULAR | Status: DC | PRN

## 2024-05-23 MED ORDER — SUCCINYLCHOLINE CHLORIDE 200 MG/10ML IV SOSY
PREFILLED_SYRINGE | INTRAVENOUS | Status: DC | PRN
  Administered 2024-05-23: 60 mg via INTRAVENOUS

## 2024-05-23 MED ORDER — PROPOFOL 10 MG/ML IV BOLUS
INTRAVENOUS | Status: AC
  Filled 2024-05-23: qty 20

## 2024-05-23 MED ORDER — OXYCODONE HCL 5 MG/5ML PO SOLN: 5.0000 mg | Freq: Once | ORAL | Status: DC | PRN

## 2024-05-23 MED ORDER — BUPIVACAINE-EPINEPHRINE (PF) 0.25% -1:200000 IJ SOLN
INTRAMUSCULAR | Status: AC
  Filled 2024-05-23: qty 30

## 2024-05-23 MED ORDER — FENTANYL CITRATE (PF) 100 MCG/2ML IJ SOLN
INTRAMUSCULAR | Status: AC
  Filled 2024-05-23: qty 2

## 2024-05-23 MED ORDER — LACTATED RINGERS IV SOLN: INTRAVENOUS | Status: DC

## 2024-05-23 MED ORDER — BUPIVACAINE-EPINEPHRINE 0.25% -1:200000 IJ SOLN
INTRAMUSCULAR | Status: DC | PRN
  Administered 2024-05-23: 3 mL

## 2024-05-23 MED ORDER — FENTANYL CITRATE (PF) 100 MCG/2ML IJ SOLN
INTRAMUSCULAR | Status: DC | PRN
  Administered 2024-05-23: 50 ug via INTRAVENOUS

## 2024-05-23 MED ORDER — MIDAZOLAM HCL 2 MG/2ML IJ SOLN
INTRAMUSCULAR | Status: AC
  Filled 2024-05-23: qty 2

## 2024-05-23 MED ORDER — PROPOFOL 10 MG/ML IV BOLUS
INTRAVENOUS | Status: DC | PRN
  Administered 2024-05-23: 100 mg via INTRAVENOUS
  Administered 2024-05-23: 30 mg via INTRAVENOUS

## 2024-05-23 MED ORDER — CEFAZOLIN SODIUM-DEXTROSE 2-4 GM/100ML-% IV SOLN
INTRAVENOUS | Status: AC
  Filled 2024-05-23: qty 100

## 2024-05-23 MED ORDER — BACITRACIN 500 UNIT/GM EX OINT
TOPICAL_OINTMENT | CUTANEOUS | Status: DC | PRN
  Administered 2024-05-23: 1 via TOPICAL

## 2024-05-23 MED ORDER — MIDAZOLAM HCL 2 MG/2ML IJ SOLN
INTRAMUSCULAR | Status: DC | PRN
  Administered 2024-05-23: 2 mg via INTRAVENOUS

## 2024-05-23 MED ORDER — CEFAZOLIN SODIUM-DEXTROSE 2-4 GM/100ML-% IV SOLN
2.0000 g | INTRAVENOUS | Status: AC
  Administered 2024-05-23: 2 g via INTRAVENOUS

## 2024-05-23 MED ORDER — BACITRACIN ZINC 500 UNIT/GM EX OINT
TOPICAL_OINTMENT | CUTANEOUS | Status: AC
Start: 2024-05-23 — End: 2024-05-23
  Filled 2024-05-23: qty 28.35

## 2024-05-23 MED ORDER — LIDOCAINE HCL (CARDIAC) PF 100 MG/5ML IV SOSY
PREFILLED_SYRINGE | INTRAVENOUS | Status: DC | PRN
  Administered 2024-05-23: 40 mg via INTRAVENOUS

## 2024-05-23 MED ORDER — ACETAMINOPHEN 10 MG/ML IV SOLN: 1000.0000 mg | Freq: Once | INTRAVENOUS | Status: DC | PRN

## 2024-05-23 MED ORDER — ONDANSETRON HCL 4 MG/2ML IJ SOLN
INTRAMUSCULAR | Status: DC | PRN
Start: 2024-05-23 — End: 2024-05-23
  Administered 2024-05-23: 4 mg via INTRAVENOUS

## 2024-05-23 SURGICAL SUPPLY — 52 items
BLADE CLIPPER SURG (BLADE) IMPLANT
BLADE SURG 15 STRL LF DISP TIS (BLADE) ×1 IMPLANT
BNDG ELASTIC 2INX 5YD STR LF (GAUZE/BANDAGES/DRESSINGS) IMPLANT
CANISTER SUCT 1200ML W/VALVE (MISCELLANEOUS) IMPLANT
CORD BIPOLAR FORCEPS 12FT (ELECTRODE) IMPLANT
COVER BACK TABLE 60X90IN (DRAPES) ×1 IMPLANT
COVER MAYO STAND STRL (DRAPES) ×1 IMPLANT
DERMABOND ADVANCED .7 DNX12 (GAUZE/BANDAGES/DRESSINGS) IMPLANT
DRAPE LAPAROTOMY 100X72 PEDS (DRAPES) IMPLANT
DRAPE U-SHAPE 76X120 STRL (DRAPES) IMPLANT
DRSG TEGADERM 2-3/8X2-3/4 SM (GAUZE/BANDAGES/DRESSINGS) IMPLANT
ELECT NDL BLADE 2-5/6 (NEEDLE) ×1 IMPLANT
ELECT NEEDLE BLADE 2-5/6 (NEEDLE) ×1 IMPLANT
ELECTRODE REM PT RETRN 9FT PED (ELECTROSURGICAL) IMPLANT
ELECTRODE REM PT RTRN 9FT ADLT (ELECTROSURGICAL) IMPLANT
GAUZE SPONGE 2X2 STRL 8-PLY (GAUZE/BANDAGES/DRESSINGS) IMPLANT
GAUZE SPONGE 4X4 12PLY STRL LF (GAUZE/BANDAGES/DRESSINGS) IMPLANT
GAUZE STRETCH 2X75IN STRL (MISCELLANEOUS) IMPLANT
GAUZE XEROFORM 1X8 LF (GAUZE/BANDAGES/DRESSINGS) IMPLANT
GLOVE BIO SURGEON STRL SZ 6.5 (GLOVE) IMPLANT
GLOVE BIO SURGEON STRL SZ8 (GLOVE) ×1 IMPLANT
GLOVE BIOGEL PI IND STRL 8 (GLOVE) ×1 IMPLANT
GOWN STRL REUS W/ TWL LRG LVL3 (GOWN DISPOSABLE) ×1 IMPLANT
GOWN STRL REUS W/ TWL XL LVL3 (GOWN DISPOSABLE) IMPLANT
GOWN STRL REUS W/TWL XL LVL3 (GOWN DISPOSABLE) ×2 IMPLANT
NDL HYPO 30GX1 BEV (NEEDLE) ×1 IMPLANT
NDL PRECISIONGLIDE 27X1.5 (NEEDLE) IMPLANT
NEEDLE HYPO 30GX1 BEV (NEEDLE) ×1 IMPLANT
NEEDLE PRECISIONGLIDE 27X1.5 (NEEDLE) IMPLANT
NS IRRIG 1000ML POUR BTL (IV SOLUTION) IMPLANT
PACK BASIN DAY SURGERY FS (CUSTOM PROCEDURE TRAY) ×1 IMPLANT
PENCIL SMOKE EVACUATOR (MISCELLANEOUS) IMPLANT
SHEET MEDIUM DRAPE 40X70 STRL (DRAPES) IMPLANT
SPIKE FLUID TRANSFER (MISCELLANEOUS) ×1 IMPLANT
SPONGE T-LAP 18X18 ~~LOC~~+RFID (SPONGE) IMPLANT
STRIP CLOSURE SKIN 1/2X4 (GAUZE/BANDAGES/DRESSINGS) IMPLANT
SUCTION TUBE FRAZIER 10FR DISP (SUCTIONS) IMPLANT
SUT MNCRL 6-0 UNDY P1 1X18 (SUTURE) IMPLANT
SUT MNCRL AB 4-0 PS2 18 (SUTURE) IMPLANT
SUT MON AB 5-0 P3 18 (SUTURE) IMPLANT
SUT MON AB 5-0 PS2 18 (SUTURE) IMPLANT
SUT PROLENE 5 0 P 3 (SUTURE) IMPLANT
SUT PROLENE 5 0 PS 2 (SUTURE) IMPLANT
SUT PROLENE 6 0 P 1 18 (SUTURE) IMPLANT
SUT VIC AB 4-0 PS2 18 (SUTURE) IMPLANT
SUT VIC AB 5-0 P-3 18X BRD (SUTURE) IMPLANT
SUT VIC AB 5-0 PS2 18 (SUTURE) IMPLANT
SYR BULB EAR ULCER 3OZ GRN STR (SYRINGE) IMPLANT
SYR CONTROL 10ML LL (SYRINGE) ×1 IMPLANT
TOWEL GREEN STERILE FF (TOWEL DISPOSABLE) ×1 IMPLANT
TRAY DSU PREP LF (CUSTOM PROCEDURE TRAY) ×1 IMPLANT
TUBE CONNECTING 20X1/4 (TUBING) IMPLANT

## 2024-05-23 NOTE — Op Note (Signed)
 DATE OF OPERATION: 05/23/2024  LOCATION: [facility] operating Room  PREOPERATIVE DIAGNOSIS: Skin lesion right upper lip  POSTOPERATIVE DIAGNOSIS: Same  PROCEDURE: Excision of skin lesion  SURGEON: Marinell Birmingham, MD  ASSISTANT: Matt Scheeler  EBL: 5 cc  CONDITION: Stable  COMPLICATIONS: None  INDICATION: The patient, Danielle Obrien, is a 43 y.o. female born on 06/25/1981, is here for removal of a 1 x 1 cm skin lesion for pathologic diagnosis.   PROCEDURE DETAILS:  The patient was seen prior to surgery and marked.  The IV antibiotics were given. The patient was taken to the operating room and given a general anesthetic. A standard time out was performed and all information was confirmed by those in the room. SCDs were placed.   Subcutaneous tissues were infiltrated with quarter percent Marcaine  with epinephrine .  An elliptical incision was made around the base of the skin lesion and the mass was removed.  Additional deep margins were excised and sent as a separate specimen.  After obtaining hemostasis the deep tissues were closed with interrupted 4-0 Monocryl sutures and the skin was closed with interrupted 5-0 Prolene sutures.  The entire length of the incision was 2 cm.  All instrument needle and sponge counts reported as correct.  There were no complications noted.  Patient was transferred to the recovery room in good condition. The patient was allowed to wake up and taken to recovery room in stable condition at the end of the case. The family was notified at the end of the case.   The advanced practice practitioner (APP) assisted throughout the case.  The APP was essential in retraction and counter traction when needed to make the case progress smoothly.  This retraction and assistance made it possible to see the tissue plans for the procedure.  The assistance was needed for blood control, tissue re-approximation and assisted with closure of the incision site.

## 2024-05-23 NOTE — Interval H&P Note (Signed)
 History and Physical Interval Note: No change in exam or indication for surgery. Site marked with her concurrence. All questions answered with the help of her interpreter Will proceed at her request  05/23/2024 11:19 AM  Danielle Obrien  has presented today for surgery, with the diagnosis of l98.9.  The various methods of treatment have been discussed with the patient and family. After consideration of risks, benefits and other options for treatment, the patient has consented to  Procedure(s) with comments: LESION EXCISION WITH COMPLEX REPAIR (N/A) - Excision of facial lesion, right upper lip as a surgical intervention.  The patient's history has been reviewed, patient examined, no change in status, stable for surgery.  I have reviewed the patient's chart and labs.  Questions were answered to the patient's satisfaction.     Danielle Obrien

## 2024-05-23 NOTE — Anesthesia Preprocedure Evaluation (Signed)
 Anesthesia Evaluation  Patient identified by MRN, date of birth, ID band Patient awake    Reviewed: Allergy & Precautions, NPO status , Patient's Chart, lab work & pertinent test results  History of Anesthesia Complications Negative for: history of anesthetic complications  Airway Mallampati: III  TM Distance: >3 FB Neck ROM: Full    Dental  (+) Dental Advisory Given,    Pulmonary asthma , Current Smoker   breath sounds clear to auscultation       Cardiovascular hypertension, Pt. on medications (-) angina (-) Past MI and (-) CHF + Valvular Problems/Murmurs  Rhythm:Regular + Systolic Click S/p AVR    1. Left ventricular ejection fraction, by estimation, is 60 to 65%. Left  ventricular ejection fraction by 3D volume is 64 %. The left ventricle has  normal function. The left ventricle has no regional wall motion  abnormalities. Left ventricular diastolic   parameters were normal. The average left ventricular global longitudinal  strain is -22.2 %. The global longitudinal strain is normal.   2. Right ventricular systolic function is normal. The right ventricular  size is normal.   3. Left atrial size was mildly dilated.   4. The mitral valve is normal in structure. Mild mitral valve  regurgitation. No evidence of mitral stenosis.   5. The aortic valve has been repaired/replaced. Aortic valve  regurgitation is trivial. No aortic stenosis is present. There is a 23 mm  Sorin Carbomedics mechanical valve present in the aortic position.  Procedure Date: 11/03/17. Echo findings are  consistent with normal structure and function of the aortic valve  prosthesis. Aortic valve mean gradient measures 7.5 mmHg. Aortic valve  Vmax measures 1.87 m/s.   6. There is borderline dilatation of the aortic root, measuring 39 mm.   7. The inferior vena cava is normal in size with greater than 50%  respiratory variability, suggesting right atrial  pressure of 3 mmHg.     Neuro/Psych  Headaches PSYCHIATRIC DISORDERS Anxiety  Bipolar Disorder      GI/Hepatic ,GERD  ,,  Endo/Other  negative endocrine ROS    Renal/GU negative Renal ROS     Musculoskeletal  (+) Arthritis ,    Abdominal   Peds  Hematology   Anesthesia Other Findings   Reproductive/Obstetrics                              Anesthesia Physical Anesthesia Plan  ASA: 3  Anesthesia Plan: General   Post-op Pain Management: Tylenol  PO (pre-op)*   Induction: Intravenous  PONV Risk Score and Plan: 2 and Ondansetron  and Dexamethasone   Airway Management Planned: Oral ETT  Additional Equipment: None  Intra-op Plan:   Post-operative Plan: Extubation in OR  Informed Consent: I have reviewed the patients History and Physical, chart, labs and discussed the procedure including the risks, benefits and alternatives for the proposed anesthesia with the patient or authorized representative who has indicated his/her understanding and acceptance.     Dental advisory given  Plan Discussed with: CRNA  Anesthesia Plan Comments:          Anesthesia Quick Evaluation

## 2024-05-23 NOTE — Discharge Instructions (Addendum)
 Leave bandaid in place for 24 hours if possible, you can replace if needed. You can get the area wet after 24 hours Avoid driving for 24 hours and until no longer taking narcotic medications.    Post Anesthesia Home Care Instructions  Activity: Get plenty of rest for the remainder of the day. A responsible individual must stay with you for 24 hours following the procedure.  For the next 24 hours, DO NOT: -Drive a car -Advertising copywriter -Drink alcoholic beverages -Take any medication unless instructed by your physician -Make any legal decisions or sign important papers.  Meals: Start with liquid foods such as gelatin or soup. Progress to regular foods as tolerated. Avoid greasy, spicy, heavy foods. If nausea and/or vomiting occur, drink only clear liquids until the nausea and/or vomiting subsides. Call your physician if vomiting continues.  Special Instructions/Symptoms: Your throat may feel dry or sore from the anesthesia or the breathing tube placed in your throat during surgery. If this causes discomfort, gargle with warm salt water. The discomfort should disappear within 24 hours.  If you had a scopolamine  patch placed behind your ear for the management of post- operative nausea and/or vomiting:  1. The medication in the patch is effective for 72 hours, after which it should be removed.  Wrap patch in a tissue and discard in the trash. Wash hands thoroughly with soap and water. 2. You may remove the patch earlier than 72 hours if you experience unpleasant side effects which may include dry mouth, dizziness or visual disturbances. 3. Avoid touching the patch. Wash your hands with soap and water after contact with the patch.

## 2024-05-23 NOTE — Anesthesia Postprocedure Evaluation (Signed)
 Anesthesia Post Note  Patient: Danielle Obrien  Procedure(s) Performed: LESION EXCISION WITH COMPLEX REPAIR     Patient location during evaluation: PACU Anesthesia Type: General Level of consciousness: awake and alert Pain management: pain level controlled Vital Signs Assessment: post-procedure vital signs reviewed and stable Respiratory status: spontaneous breathing, nonlabored ventilation, respiratory function stable and patient connected to nasal cannula oxygen Cardiovascular status: blood pressure returned to baseline and stable Postop Assessment: no apparent nausea or vomiting Anesthetic complications: no   No notable events documented.  Last Vitals:  Vitals:   05/23/24 1315 05/23/24 1354  BP:    Pulse: 72 73  Resp: 20 20  Temp:  36.4 C  SpO2: 99% 99%    Last Pain:  Vitals:   05/23/24 1354  TempSrc: Temporal  PainSc: 0-No pain                 Thom JONELLE Peoples

## 2024-05-23 NOTE — Anesthesia Procedure Notes (Signed)
 Procedure Name: Intubation Date/Time: 05/23/2024 12:05 PM  Performed by: Donnell Berwyn SQUIBB, CRNAPre-anesthesia Checklist: Patient identified, Emergency Drugs available, Suction available, Patient being monitored and Timeout performed Patient Re-evaluated:Patient Re-evaluated prior to induction Oxygen Delivery Method: Circle system utilized Preoxygenation: Pre-oxygenation with 100% oxygen Induction Type: IV induction Ventilation: Mask ventilation without difficulty Laryngoscope Size: Mac and 3 Grade View: Grade I Tube type: Oral Tube size: 6.5 mm Number of attempts: 1 Airway Equipment and Method: Stylet Placement Confirmation: ETT inserted through vocal cords under direct vision, positive ETCO2 and breath sounds checked- equal and bilateral Secured at: 20 cm Tube secured with: Tape Dental Injury: Teeth and Oropharynx as per pre-operative assessment

## 2024-05-23 NOTE — Transfer of Care (Signed)
 Immediate Anesthesia Transfer of Care Note  Patient: Danielle Obrien  Procedure(s) Performed: LESION EXCISION WITH COMPLEX REPAIR  Patient Location: PACU  Anesthesia Type:General  Level of Consciousness: awake, alert , and patient cooperative  Airway & Oxygen Therapy: Patient Spontanous Breathing and Patient connected to nasal cannula oxygen  Post-op Assessment: Report given to RN and Post -op Vital signs reviewed and stable  Post vital signs: Reviewed and stable  Last Vitals:  Vitals Value Taken Time  BP 170/92 05/23/24 12:57  Temp    Pulse 74 05/23/24 12:57  Resp 15 05/23/24 12:59  SpO2 100 % 05/23/24 12:57  Vitals shown include unfiled device data.  Last Pain:  Vitals:   05/23/24 1052  TempSrc: Temporal  PainSc: 7          Complications: No notable events documented.

## 2024-05-24 ENCOUNTER — Encounter (HOSPITAL_BASED_OUTPATIENT_CLINIC_OR_DEPARTMENT_OTHER): Payer: Self-pay | Admitting: Plastic Surgery

## 2024-05-25 LAB — SURGICAL PATHOLOGY

## 2024-05-29 ENCOUNTER — Other Ambulatory Visit: Payer: Self-pay | Admitting: Cardiology

## 2024-05-29 ENCOUNTER — Ambulatory Visit (INDEPENDENT_AMBULATORY_CARE_PROVIDER_SITE_OTHER): Admitting: Plastic Surgery

## 2024-05-29 ENCOUNTER — Encounter: Payer: Self-pay | Admitting: Plastic Surgery

## 2024-05-29 ENCOUNTER — Telehealth: Payer: Self-pay | Admitting: Plastic Surgery

## 2024-05-29 VITALS — BP 125/73 | HR 68 | Ht 59.5 in | Wt 114.0 lb

## 2024-05-29 DIAGNOSIS — C4401 Basal cell carcinoma of skin of lip: Secondary | ICD-10-CM

## 2024-05-29 DIAGNOSIS — Z954 Presence of other heart-valve replacement: Secondary | ICD-10-CM

## 2024-05-29 NOTE — Progress Notes (Signed)
 Danielle Obrien returns today 5 days postop from excision of lesion on her right upper lip.  She has had no problems in other than some mild discomfort in the incision.  She is very happy with the overall results.  The incision is clean dry and intact and sutures are in place.  Pathology returned as a basal cell carcinoma with positive margins:  A. LIP, RIGHT UPPER, EXCISION:  Basal cell carcinoma.  Tumor extends to the edge margins and deep margins of the specimen.   B. LIP, RIGHT UPPER ADDITIONAL DEEP MARGIN, EXCISION:  Basal cell carcinoma.  Tumor involves margins of the specimen.   I discussed this with Danielle Obrien.  Physical examination of the head and neck does not reveal any adenopathy in the postauricular submandibular submental or cervical nodal chains.  I will place a request for evaluation and Mohs surgery.  I will also order a CT scan of the head and neck.  Will keep her appointment in 2 to 3 weeks to ensure that she is moving forward with further care.

## 2024-05-29 NOTE — Telephone Encounter (Signed)
 Needs so info, please reach out to her and advise, need pictures of pathology results

## 2024-06-01 ENCOUNTER — Ambulatory Visit: Attending: Cardiology | Admitting: *Deleted

## 2024-06-01 DIAGNOSIS — Z7901 Long term (current) use of anticoagulants: Secondary | ICD-10-CM

## 2024-06-01 DIAGNOSIS — Z5181 Encounter for therapeutic drug level monitoring: Secondary | ICD-10-CM

## 2024-06-01 DIAGNOSIS — Z954 Presence of other heart-valve replacement: Secondary | ICD-10-CM

## 2024-06-01 LAB — POCT INR: POC INR: 2.8

## 2024-06-01 NOTE — Patient Instructions (Signed)
 Description   *PLEASE PUT ON APPT NO ONSITE INTERPRETER NEEDED*  Continue taking warfarin 1 tablet daily except for 1.5 tablets on Sundays, Tuesdays and Thursdays.  Recheck INR in 4 weeks, please let us  know of any upcoming procedures or medication changes. Coumadin  Clinic [phone]

## 2024-06-01 NOTE — Progress Notes (Signed)
Please see anticoagulation encounter.

## 2024-06-04 ENCOUNTER — Other Ambulatory Visit: Payer: Self-pay | Admitting: Family Medicine

## 2024-06-06 ENCOUNTER — Other Ambulatory Visit: Payer: Self-pay | Admitting: Internal Medicine

## 2024-06-06 DIAGNOSIS — I10 Essential (primary) hypertension: Secondary | ICD-10-CM

## 2024-06-12 ENCOUNTER — Ambulatory Visit: Admitting: Surgical

## 2024-06-12 VITALS — BP 136/74 | HR 66

## 2024-06-12 DIAGNOSIS — L989 Disorder of the skin and subcutaneous tissue, unspecified: Secondary | ICD-10-CM

## 2024-06-12 DIAGNOSIS — C4401 Basal cell carcinoma of skin of lip: Secondary | ICD-10-CM

## 2024-06-12 DIAGNOSIS — D489 Neoplasm of uncertain behavior, unspecified: Secondary | ICD-10-CM

## 2024-06-12 NOTE — Progress Notes (Signed)
 43 year old female here for follow-up after excision of skin lesion of right upper lip.  Path was positive for:   A. LIP, RIGHT UPPER, EXCISION:  Basal cell carcinoma.  Tumor extends to the edge margins and deep margins of the specimen.   B. LIP, RIGHT UPPER ADDITIONAL DEEP MARGIN, EXCISION:  Basal cell carcinoma.  Tumor involves margins of the specimen.   Patient has been seen by Dr. Waddell, referred for Mohs surgery.  CT scan has been ordered as well.  Today patient reports she is well in regards to recovery.  She has not noticed any lumps or swelling in her neck.  She has not spoke with dermatology yet.  Per EMR review CT scan has been approved by insurance.  Not yet scheduled.  On exam right upper lip incision is intact and well-healed.   A/P:  Notified front office staff of previous referral to Derm, they will mark as urgent.  Provided patient with contact info for dermatology.  Recommend scheduling virtual visit with our office in 2 to 3 weeks to follow-up on progress.  Pictures were obtained of the patient and placed in the chart with the patient's or guardian's permission.

## 2024-06-15 ENCOUNTER — Other Ambulatory Visit: Payer: Self-pay | Admitting: Family Medicine

## 2024-06-15 ENCOUNTER — Other Ambulatory Visit: Payer: Self-pay

## 2024-06-15 MED ORDER — DULOXETINE HCL 20 MG PO CPEP: 40.0000 mg | ORAL_CAPSULE | Freq: Every day | ORAL | 0 refills | Status: DC

## 2024-06-21 NOTE — Progress Notes (Unsigned)
 [facility] [address] [place] [address] Phone: [phone] Subjective:   Danielle Obrien am a scribe for Dr. Claudene.   I'm seeing this patient by the request  of:  Danielle Debby CROME, MD  CC: Left leg pain  YEP:Dlagzrupcz  03/23/2024 Tightness noted.  No exacerbation of the underlying problem.  Likely some compensation for the instability noted.  Leg length discrepancy as well.  Patient is accompanied with a sign language interpreter today.  Discussed potential injections if needed.  Follow-up again in 2 month      Update 06/22/2024 Danielle Obrien is a 43 y.o. female coming in with complaint of L leg and SI joint pain. Patient states that she hit her left leg on a box or something at work and she has a bruise.        Past Medical History:  Diagnosis Date   ADD (attention deficit disorder)    Alcohol abuse, in remission 2012   per pt in remission since 2012   Anticoagulated on Coumadin     coumadin --- managed by cardiology/ coumadin  clinic   Arthritis    In hips   Asthma    allergy induced, rare inhaler use   Auditory processing disorder    per pt very HOH, first language ASL   Bipolar disorder (HCC)    Chronic left hip pain    Chronic vertigo    Complication of anesthesia    per pt due to auditory processing disorder pt is unable to hear when waking up and also gets very anxious   Family history of uterine cancer    GAD (generalized anxiety disorder)    GERD (gastroesophageal reflux disease)    History of cervical dysplasia    History of condyloma acuminatum 2017   s/p laser ablation vulva   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   biopsy and CO2 laser ablation   HOH (hard of hearing)    per pt very HOH due to auditroy processing disorder   Hyperlipidemia    Hypertension    Muscle spasms of both lower extremities    both hips   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    23 mm Sorin [employer] [place] bileaflet mechanical valve via right anterior mini thoracotomy   Vulvar dysplasia    Past Surgical History:  Procedure Laterality Date   ANTERIOR CRUCIATE LIGAMENT REPAIR  1993   AORTIC VALVE REPLACEMENT N/A 11/03/2017   Procedure: MINIMALLY INVASIVE AORTIC VALVE REPLACEMENT( MINI THORACOTOMY);  Surgeon: Dusty Sudie DEL, MD;  Location: [facility] OR;  Service: Open Heart Surgery;  Laterality: N/A;   CERVICAL BIOPSY  W/ LOOP ELECTRODE EXCISION  2010   CIN III w/extension to glands   CHOLECYSTECTOMY N/A 07/11/2023   Procedure: LAPAROSCOPIC CHOLECYSTECTOMY WITH POSSIBLE INTRAOPERATIVE CHOLANGIOGRAM;  Surgeon: Signe Mitzie LABOR, MD;  Location: [facility] OR;  Service: General;  Laterality: N/A;   CLOSED REDUCTION HIP DISLOCATION Left 1981   CO2 LASER APPLICATION N/A 05/12/2022   Procedure: CO2 LASER APPLICATION TO VULVA;  Surgeon: Viktoria Comer SAUNDERS, MD;  Location: [facility];  Service: Gynecology;  Laterality: N/A;   COLPOSCOPY  10/30/2008   CIN I & II   COLPOSCOPY  07/31/2000   Neg. ECC   COLPOSCOPY  06/30/2001   CIN I   COLPOSCOPY  08/30/2004   ECC--atypia   COLPOSCOPY N/A 01/21/2016   Procedure: COLPOSCOPY with vaginal biopsy with CO 2 Laser of Vaginal and  vulvar condyloma;  Surgeon: Bobie FORBES Cathlyn JAYSON Nikki, MD;  Location: [facility] ORS;  Service: Gynecology;  Laterality: N/A;  [name] will be here 2/21 for 1115 case confirmed 01/16/15 - TS   HARDWARE REMOVAL Left 1992   Left hip   INGUINAL HERNIA REPAIR Bilateral    1981;  [id number]   LEFT AND RIGHT HEART CATHETERIZATION WITH CORONARY ANGIOGRAM N/A 05/18/2014   Procedure: LEFT AND RIGHT HEART CATHETERIZATION WITH CORONARY ANGIOGRAM;  Surgeon: Candyce GORMAN Reek, MD;  Location: [facility] CATH LAB;  Service: Cardiovascular;  Laterality: N/A;   LESION EXCISION WITH COMPLEX REPAIR N/A 05/23/2024   Procedure: LESION EXCISION WITH COMPLEX REPAIR;  Surgeon: Waddell Leonce NOVAK, MD;  Location: [facility];   Service: Plastics;  Laterality: N/A;  Excision of facial lesion, right upper lip   LESION REMOVAL Right 05/12/2022   Procedure: ANTONETTE OF RIGHT CHEEK CYST;  Surgeon: Elisabeth Craig GORMAN, MD;  Location: [facility];  Service: Plastics;  Laterality: Right;   LYMPH NODE BIOPSY  1995   NASAL SEPTUM SURGERY  2002   ORIF HIP FRACTURE Left 1990   per pt and  took out growth plate in Right knee   ROBOTIC ASSISTED TOTAL HYSTERECTOMY  02/26/2009   @[facility]    TEE WITHOUT CARDIOVERSION N/A 11/03/2017   Procedure: TRANSESOPHAGEAL ECHOCARDIOGRAM (TEE);  Surgeon: Dusty Sudie DEL, MD;  Location: [facility] OR;  Service: Open Heart Surgery;  Laterality: N/A;   TONSILLECTOMY AND ADENOIDECTOMY  1990   TYMPANOSTOMY TUBE PLACEMENT Bilateral    x3  per pt last one 1982   VULVA /PERINEUM BIOPSY N/A 05/12/2022   Procedure: POSSIBLE VULVAR BIOPSY; POSSIBLE VAGINAL BIOPSY;  Surgeon: Viktoria Comer SAUNDERS, MD;  Location: [facility];  Service: Gynecology;  Laterality: N/A;   VULVECTOMY N/A 05/12/2022   Procedure: WIDE EXCISION VULVECTOMY;  Surgeon: Viktoria Comer SAUNDERS, MD;  Location[facility];  Service: Gynecology;  Laterality: N/A;   VULVECTOMY N/A 08/12/2022   Procedure: WIDE LOCAL EXCISION VULVECTOMY;  Surgeon: Viktoria Comer SAUNDERS, MD;  Location: [facility];  Service: Gynecology;  Laterality: N/A;   Social History   Socioeconomic History   Marital status: Single    Spouse name: Not on file   Number of children: Not on file   Years of education: Not on file   Highest education level: Not on file  Occupational History   Not on file  Tobacco Use   Smoking status: Every Day    Current packs/day: 0.25    Average packs/day: 0.3 packs/day for 20.0 years (5.0 ttl pk-yrs)    Types: Cigarettes   Smokeless tobacco: Never  Vaping Use   Vaping status: Never Used  Substance and Sexual Activity   Alcohol use: No    Comment: in remission since 2012   Drug use: Yes     Types: Marijuana    Comment: 08-11-22 last, per pt smokes on average 1/4oz per week   Sexual activity: Yes    Partners: Male    Birth control/protection: Surgical    Comment: Hysterectomy  Other Topics Concern   Not on file  Social History Narrative   Not on file   Social Drivers of Health   Financial Resource Strain: Low Risk  (12/10/2018)   Overall Financial Resource Strain (CARDIA)    Difficulty of Paying Living Expenses: Not hard at all  Food Insecurity: No Food Insecurity (07/07/2023)   Hunger Vital Sign    Worried About Running Out of Food  in the Last Year: Never true    Ran Out of Food in the Last Year: Never true  Transportation Needs: No Transportation Needs (07/07/2023)   PRAPARE - Administrator, Civil Service (Medical): No    Lack of Transportation (Non-Medical): No  Physical Activity: Unknown (12/10/2018)   Exercise Vital Sign    Days of Exercise per Week: Patient declined    Minutes of Exercise per Session: Patient declined  Stress: Not on file  Social Connections: Unknown (12/10/2018)   Social Connection and Isolation Panel    Frequency of Communication with Friends and Family: Patient declined    Frequency of Social Gatherings with Friends and Family: Patient declined    Attends Religious Services: Patient declined    Database administrator or Organizations: Patient declined    Attends Engineer, structural: Patient declined    Marital Status: Patient declined   Allergies  Allergen Reactions   Cyanoacrylate    Demerol  Nausea And Vomiting   Ibuprofen Other (See Comments)    Gastritis    Wound Dressing Adhesive Rash    Dermabond allergy    Family History  Problem Relation Age of Onset   Hyperlipidemia Mother    Hypertension Mother    Thyroid  disease Mother    Other Mother        Pituitary tumor   Hyperlipidemia Father    Hypertension Father    Migraines Father    Skin cancer Father    Hypertension Brother    Hyperlipidemia Brother     Heart attack Paternal Uncle    Thyroid  disease Maternal Grandmother    Uterine cancer Maternal Grandmother 47   Dementia Maternal Grandmother    Thyroid  disease Maternal Grandfather    Prostate cancer Maternal Grandfather 70   Dementia Paternal Grandmother    Heart attack Paternal Grandfather    Colon cancer Paternal Great-grandmother      Current Outpatient Medications (Cardiovascular):    indapamide  (LOZOL ) 1.25 MG tablet, TAKE 1 TABLET BY MOUTH DAILY   metoprolol  tartrate (LOPRESSOR ) 25 MG tablet, Take 0.5 tablets (12.5 mg total) by mouth daily. Please keep scheduled appointment for future refills. Thank you.   olmesartan  (BENICAR ) 20 MG tablet, TAKE 1 TABLET BY MOUTH DAILY   rosuvastatin  (CRESTOR ) 20 MG tablet, TAKE 1 TABLET BY MOUTH DAILY  Current Outpatient Medications (Respiratory):    albuterol  (VENTOLIN  HFA) 108 (90 Base) MCG/ACT inhaler, INHALE TWO PUFFS BY MOUTH EVERY 6 HOURS AS NEEDED FOR WHEEZING OR SHORTNESS OF BREATH   Albuterol -Budesonide  (AIRSUPRA ) 90-80 MCG/ACT AERO, Inhale 2 Inhalations into the lungs every 4 (four) hours as needed.   benzonatate  (TESSALON ) 100 MG capsule, Take 1 capsule (100 mg total) by mouth every 8 (eight) hours.   HYDROcodone  bit-homatropine (HYCODAN) 5-1.5 MG/5ML syrup, Take 5 mLs by mouth every 8 (eight) hours as needed for cough. (Patient not taking: Reported on 05/29/2024)  Current Outpatient Medications (Analgesics):    aspirin  EC 81 MG tablet, Take 81 mg by mouth daily. Swallow whole.   traMADol  (ULTRAM ) 50 MG tablet, Take 2 tablets (100 mg total) by mouth every 12 (twelve) hours as needed.  Current Outpatient Medications (Hematological):    warfarin (COUMADIN ) 7.5 MG tablet, TAKE ONE TO ONE AND ONE-HALF (1 TO 1.5) TABLETS BY MOUTH DAILY AS DIRECTED BY ANTICOAGULATION CLINIC  Current Outpatient Medications (Other):    DULoxetine  (CYMBALTA ) 20 MG capsule, Take 2 capsules (40 mg total) by mouth daily.   gabapentin  (NEURONTIN ) 300 MG  capsule,  TAKE TWO CAPSULES BY MOUTH THREE TIMES A DAY   lamoTRIgine  (LAMICTAL ) 200 MG tablet, TAKE 2 TABLETS BY MOUTH DAILY   meclizine  (ANTIVERT ) 12.5 MG tablet, TAKE ONE TABLET BY MOUTH TWICE A DAY AS NEEDED FOR DIZZINESS   tiZANidine  (ZANAFLEX ) 4 MG tablet, Take 1 tablet (4 mg total) by mouth at bedtime.   Reviewed prior external information including notes and imaging from  primary care provider As well as notes that were available from care everywhere and other healthcare systems.  Past medical history, social, surgical and family history all reviewed in electronic medical record.  No pertanent information unless stated regarding to the chief complaint.   Review of Systems:  No headache, visual changes, nausea, vomiting, diarrhea, constipation, dizziness, abdominal pain, skin rash, fevers, chills, night sweats, weight loss, swollen lymph nodes, body aches, joint swelling, chest pain, shortness of breath, mood changes. POSITIVE muscle aches  Objective  Blood pressure 122/70, pulse 68, height 4' 11.5 (1.511 m), weight 106 lb (48.1 kg), SpO2 97%.   General: No apparent distress alert and oriented x3 mood and affect normal, dressed appropriately.  Patient a little more manic than usual accompanied with a translator HEENT: Pupils equal, extraocular movements intact  Respiratory: Patient's speak in full sentences and does not appear short of breath  Cardiovascular: No lower extremity edema, non tender, no erythema  Left leg does have a contusion noted with bruising in different amounts down the lateral gastrocnemius head.  No masses appreciated.  No fluid accumulation noted.  Good range of motion of the ankle noted.  Neurovascularly intact distally.      Impression and Recommendations:     The above documentation has been reviewed and is accurate and complete Shalan Neault M Tolbert Matheson, DO

## 2024-06-22 ENCOUNTER — Ambulatory Visit: Admitting: Family Medicine

## 2024-06-22 VITALS — BP 122/70 | HR 68 | Ht 59.5 in | Wt 106.0 lb

## 2024-06-22 DIAGNOSIS — S8012XA Contusion of left lower leg, initial encounter: Secondary | ICD-10-CM | POA: Diagnosis not present

## 2024-06-22 NOTE — Assessment & Plan Note (Signed)
 Contusion, patient is on anticoagulation.  Discussed icing, Arnica, discussed heel lifts.  Follow-up with me again in 3 months

## 2024-06-22 NOTE — Patient Instructions (Addendum)
 Good to see you. Arnica lotion.  Tell the boxes to stop beating up on you.  See me again in 2-3 months.

## 2024-06-26 ENCOUNTER — Encounter: Admitting: Surgical

## 2024-06-26 ENCOUNTER — Ambulatory Visit (INDEPENDENT_AMBULATORY_CARE_PROVIDER_SITE_OTHER): Admitting: Surgical

## 2024-06-26 DIAGNOSIS — L989 Disorder of the skin and subcutaneous tissue, unspecified: Secondary | ICD-10-CM

## 2024-06-26 DIAGNOSIS — D489 Neoplasm of uncertain behavior, unspecified: Secondary | ICD-10-CM

## 2024-06-26 DIAGNOSIS — C4401 Basal cell carcinoma of skin of lip: Secondary | ICD-10-CM

## 2024-06-26 NOTE — Progress Notes (Signed)
 43 year old female here for follow-up via telephone to discuss referral for Mohs surgery.  She had a basal cell carcinoma of her right upper lip with positive margins.  Was referred to Mohs surgery.  She also had a CT scan ordered by Dr. Waddell, but she reports she has had trouble getting this scheduled.   She reports she has an appointment with them on 07/04/2024 with Dr. Paci for evaluation.  She reports she is nervous for this appointment and has some questions about what to expect.  The patient gave consent to have this visit done by telemedicine / virtual visit, two identifiers were used to identify patient. This is also consent for access the chart and treat the patient via this visit. The patient is located in [place] .  I, the provider, am at the office.  We spent 5 minutes together for the visit.  Joined by telephone.  Patient does usually use a sign language interpreter, but she reports today that she can hear me very well via the phone as she turned her phone up and reports that she does not need an interpreter for this encounter.  Answered patient's questions in regards to Mohs surgery, discussed with her the typical process and what Mohs surgery is.  Discussed with her that dermatology would review this with her more, but she was appreciative of the information and feels better after today's encounter.    Discussed with patient that I would notify staff of her trouble with the CT scan to see if anything additional needs to be completed on our end.  Patient was appreciative.

## 2024-06-27 ENCOUNTER — Encounter: Payer: Self-pay | Admitting: Dermatology

## 2024-06-29 ENCOUNTER — Ambulatory Visit: Attending: Cardiology | Admitting: *Deleted

## 2024-06-29 DIAGNOSIS — Z7901 Long term (current) use of anticoagulants: Secondary | ICD-10-CM | POA: Diagnosis not present

## 2024-06-29 DIAGNOSIS — Z5181 Encounter for therapeutic drug level monitoring: Secondary | ICD-10-CM | POA: Diagnosis not present

## 2024-06-29 DIAGNOSIS — Z954 Presence of other heart-valve replacement: Secondary | ICD-10-CM

## 2024-06-29 LAB — POCT INR: POC INR: 1.4

## 2024-06-29 NOTE — Patient Instructions (Signed)
 Description   *PLEASE PUT ON APPT NO ONSITE INTERPRETER NEEDED*   Take 2 tablets of warfarin today and 1.5 tablets of warfarin tomorrow then continue taking warfarin 1 tablet daily except for 1.5 tablets on Sundays, Tuesdays and Thursdays.  Recheck INR in 2 weeks, please let us  know of any upcoming procedures or medication changes. Coumadin  Clinic [phone]

## 2024-06-29 NOTE — Addendum Note (Signed)
 Addended by: ROYDEN RAKE B on: 06/29/2024 01:27 PM   Modules accepted: Orders

## 2024-06-29 NOTE — Progress Notes (Signed)
 INR 1.4. Please see anticoagulation encounter

## 2024-07-03 ENCOUNTER — Other Ambulatory Visit: Payer: Self-pay | Admitting: Internal Medicine

## 2024-07-03 DIAGNOSIS — I10 Essential (primary) hypertension: Secondary | ICD-10-CM

## 2024-07-03 DIAGNOSIS — F3177 Bipolar disorder, in partial remission, most recent episode mixed: Secondary | ICD-10-CM

## 2024-07-04 ENCOUNTER — Encounter: Payer: Self-pay | Admitting: Dermatology

## 2024-07-04 ENCOUNTER — Ambulatory Visit: Admitting: Dermatology

## 2024-07-04 VITALS — BP 137/89 | HR 62 | Temp 98.4°F

## 2024-07-04 DIAGNOSIS — C4491 Basal cell carcinoma of skin, unspecified: Secondary | ICD-10-CM | POA: Diagnosis not present

## 2024-07-04 DIAGNOSIS — Z719 Counseling, unspecified: Secondary | ICD-10-CM

## 2024-07-04 DIAGNOSIS — D696 Thrombocytopenia, unspecified: Secondary | ICD-10-CM

## 2024-07-04 MED ORDER — AMOXICILLIN 500 MG PO TABS: 2000.0000 mg | ORAL_TABLET | Freq: Once | ORAL | 0 refills | Status: AC

## 2024-07-04 NOTE — Progress Notes (Deleted)
   Follow-Up Visit   Subjective  Danielle Obrien is a 43 y.o. female who presents for the following: Mohs of right upper lip  The following portions of the chart were reviewed this encounter and updated as appropriate: medications, allergies, medical history  Review of Systems:  No other skin or systemic complaints except as noted in HPI or Assessment and Plan.  Objective  Well appearing patient in no apparent distress; mood and affect are within normal limits.  A focused examination was performed of the following areas: Right upper lip Relevant physical exam findings are noted in the Assessment and Plan.     Assessment & Plan      No follow-ups on file.  I, Darice Smock, CMA, am acting as scribe for RUFUS CHRISTELLA HOLY, MD.   Documentation: I have reviewed the above documentation for accuracy and completeness, and I agree with the above.  RUFUS CHRISTELLA HOLY, MD

## 2024-07-04 NOTE — Progress Notes (Addendum)
 New Patient Visit   Subjective  Danielle Obrien is a 43 y.o. female who presents for the following: Referred by Dr. Waddell and Donnice Edelson, PA-C in Plastic Surgery for a positive margin excision of the right upper cutaneous lip. Patient has a complex medical history including a history of mechanical heart valve placed in 2018 and thrombocytopenia with recent platelet count in April 2025 of 149. Patient presents with sign language interpreter for consult for Mohs surgery for the positive excision BCC on the right upper cutaneous lip.   The following portions of the chart were reviewed this encounter and updated as appropriate: medications, allergies, medical history  Review of Systems:  No other skin or systemic complaints except as noted in HPI or Assessment and Plan.  Objective  Well appearing patient in no apparent distress; mood and affect are within normal limits.  All findings within normal limits unless otherwise noted below.  A focused examination was performed of the following areas: Face Lip  Relevant exam findings are noted in the Assessment and Plan.    Assessment & Plan   Basal Cell Carcinoma- positive margins from excision; with history of Mechanical Heart Valve and Thrombocytopenia - Referred for Mohs micrographic surgery by Plastic Surgery for positive margin BCC located on the right upper cutaneous lip. - Past medical history notable for mechanical heart valve (2018) and thrombocytopenia (most recent platelet count of 149K in April 2025). - Discussed the patient's clinical concerns and surgical considerations, including the necessary platelet count for proceeding with Mohs surgery (minimum 50K). Plan to re-order CBC with differential to confirm platelet count in preparation for next week's surgery.  Surgical Plan: - Mohs surgery with reconstruction as indicated. - Discussed the stepwise nature of Mohs surgery, including the tissue-sparing benefits and the high  cure rate for BCC in sensitive areas such as the lip. - Anticipated need for immediate reconstruction after tumor removal. - Discussed potential reconstruction techniques, including flap or skin graft, based on defect size and location. Flap reconstruction is preferable where feasible, but skin grafting may be required for larger defects. - Discussed potential risks of Mohs surgery (bleeding, infection, scarring, functional impairment, poor wound healing, delayed graft take, need for revision) and risks associated with reconstruction (asymmetry, graft failure). -Discussed the patient's mechanical valve and the proximity of the surgical site to mucosal surfaces. As prophylaxis, recommended 2g amoxicillin  30-60 minutes prior to the procedure.  - amoxicillin  (AMOXIL ) 500 MG tablet; Take 4 tablets (2,000 mg total) by mouth once for 1 dose. 30-60 minutes prior to the procedure  Dispense: 4 tablet; Refill: 0 - CBC with Differential/Platelet  Decision-Making:  This visit meets criteria for CPT [phone] due to the high complexity of medical decision making and the extensive time required. The patient has a significant and active medical history including thrombocytopenia requiring proactive ordering of CBC with differential and review of recent and prior hematology results; mechanical heart valve necessitating prophylactic antibiotics due to lesion location on the lip; and a history of prior positive-margin excision by plastic surgery prompting referral for Mohs micrographic surgery.  Extensive review of outside records, operative reports, pathology results, and lab data was performed. Detailed discussion was held with the patient regarding the risks, benefits, and alternatives of Mohs surgery, anticipated defect size, and likely reconstructive plan (anticipated local flap repair). Counseling included postoperative expectations and the increased risk for complications and prolonged recovery given her comorbid  conditions.   This encounter involved high level medical decision making due  to: - Multiple significant comorbidities affecting surgical risk and postoperative healing. - Need for review and interpretation of multiple complex data sets (operative, pathology, lab, and cardiology records). - Preoperative planning and coordination of care with other specialties. - Extensive patient counseling for a complex procedure with elevated complication risk.  Total time spent exceeded 60 minutes including record review, face-to-face evaluation, counseling, and care coordination.  BASAL CELL CARCINOMA (BCC), UNSPECIFIED SITE   Related Procedures CBC with Differential/Platelet Related Medications amoxicillin  (AMOXIL ) 500 MG tablet Take 4 tablets (2,000 mg total) by mouth once for 1 dose. 30-60 minutes prior to the procedure THROMBOCYTOPENIA [facility])   Related Procedures CBC with Differential/Platelet  Return in 6 days (on 07/10/2024) for Mohs surgery at 8:30 AM; Tier 4.   Documentation: I have reviewed the above documentation for accuracy and completeness, and I agree with the above.  RUFUS CHRISTELLA HOLY, MD

## 2024-07-04 NOTE — Patient Instructions (Addendum)
 [name]

## 2024-07-05 NOTE — Telephone Encounter (Signed)
 Last OV 01/11/24 - acute Last med check 12/16/23, f/u 3 months Next OV not scheduled  Last refill(s) Lamictal  04/10/24 Qty #180/0  Olmesartan  03/10/24 Qty #90/0  Rosuvastatin  04/10/24 Qty #90/0  Pt was due for f/u OV in April, please reach out to schedule.

## 2024-07-06 ENCOUNTER — Inpatient Hospital Stay: Attending: Gynecologic Oncology

## 2024-07-06 DIAGNOSIS — Z79899 Other long term (current) drug therapy: Secondary | ICD-10-CM | POA: Insufficient documentation

## 2024-07-06 DIAGNOSIS — D693 Immune thrombocytopenic purpura: Secondary | ICD-10-CM

## 2024-07-06 DIAGNOSIS — D649 Anemia, unspecified: Secondary | ICD-10-CM

## 2024-07-06 DIAGNOSIS — D071 Carcinoma in situ of vulva: Secondary | ICD-10-CM | POA: Diagnosis present

## 2024-07-06 LAB — CBC WITH DIFFERENTIAL/PLATELET
Abs Immature Granulocytes: 0.01 K/uL (ref 0.00–0.07)
Basophils Absolute: 0.1 K/uL (ref 0.0–0.1)
Basophils Relative: 1 %
Eosinophils Absolute: 0.3 K/uL (ref 0.0–0.5)
Eosinophils Relative: 5 %
HCT: 42.7 % (ref 36.0–46.0)
Hemoglobin: 14.5 g/dL (ref 12.0–15.0)
Immature Granulocytes: 0 %
Lymphocytes Relative: 35 %
Lymphs Abs: 2.4 K/uL (ref 0.7–4.0)
MCH: 31.2 pg (ref 26.0–34.0)
MCHC: 34 g/dL (ref 30.0–36.0)
MCV: 91.8 fL (ref 80.0–100.0)
Monocytes Absolute: 0.5 K/uL (ref 0.1–1.0)
Monocytes Relative: 7 %
Neutro Abs: 3.6 K/uL (ref 1.7–7.7)
Neutrophils Relative %: 52 %
Platelets: 130 K/uL — ABNORMAL LOW (ref 150–400)
RBC: 4.65 MIL/uL (ref 3.87–5.11)
RDW: 13.4 % (ref 11.5–15.5)
WBC: 6.9 K/uL (ref 4.0–10.5)
nRBC: 0 % (ref 0.0–0.2)

## 2024-07-06 LAB — VITAMIN B12: Vitamin B-12: 458 pg/mL (ref 180–914)

## 2024-07-10 ENCOUNTER — Encounter: Payer: Self-pay | Admitting: Dermatology

## 2024-07-10 ENCOUNTER — Ambulatory Visit: Admitting: Dermatology

## 2024-07-10 VITALS — BP 127/76 | HR 68 | Temp 98.1°F

## 2024-07-10 DIAGNOSIS — C4491 Basal cell carcinoma of skin, unspecified: Secondary | ICD-10-CM

## 2024-07-10 DIAGNOSIS — L579 Skin changes due to chronic exposure to nonionizing radiation, unspecified: Secondary | ICD-10-CM | POA: Diagnosis not present

## 2024-07-10 DIAGNOSIS — D696 Thrombocytopenia, unspecified: Secondary | ICD-10-CM

## 2024-07-10 DIAGNOSIS — L814 Other melanin hyperpigmentation: Secondary | ICD-10-CM

## 2024-07-10 DIAGNOSIS — C4401 Basal cell carcinoma of skin of lip: Secondary | ICD-10-CM

## 2024-07-10 MED ORDER — MUPIROCIN 2 % EX OINT: 1.0000 | TOPICAL_OINTMENT | Freq: Two times a day (BID) | CUTANEOUS | 0 refills | Status: AC

## 2024-07-10 MED ORDER — OXYCODONE HCL 5 MG PO TABS: 5.0000 mg | ORAL_TABLET | Freq: Four times a day (QID) | ORAL | 0 refills | Status: DC | PRN

## 2024-07-10 NOTE — Progress Notes (Signed)
 Follow-Up Visit   Subjective  Danielle Obrien is a 43 y.o. female who presents for the following: Mohs of a positive margin excision Basal Cell Carcinoma right upper cutaneous lip, referred by Dr. Waddell in Plastic Surgery. Patient has a sign language interpreter with her.   The following portions of the chart were reviewed this encounter and updated as appropriate: medications, allergies, medical history  Review of Systems:  No other skin or systemic complaints except as noted in HPI or Assessment and Plan.  Objective  Well appearing patient in no apparent distress; mood and affect are within normal limits.  A focused examination was performed of the following areas: Right upper cutaneous Relevant physical exam findings are noted in the Assessment and Plan.   right cutaneous lip Surgical Scar   Assessment & Plan   Basal Cell Carcinoma- positive margins from excision; with history of Mechanical Heart Valve and Thrombocytopenia - Referred for Mohs micrographic surgery by Plastic Surgery for positive margin BCC located on the right upper cutaneous lip. - Past medical history notable for mechanical heart valve (2018) and thrombocytopenia (most recent platelet count of 149K in April 2025). - Discussed the patient's clinical concerns and surgical considerations, including the necessary platelet count for proceeding with Mohs surgery (minimum 50K). Plan to re-order CBC with differential to confirm platelet count in preparation for next week's surgery.  Thrombocytopenia-  CBC with Diff- 129 platelets, OK to proceed with surgery  Pre-Operative Anxiety - Lorazepam  given at bedside.  BASAL CELL CARCINOMA (BCC), UNSPECIFIED SITE right cutaneous lip Mohs surgery  Consent obtained: written  Anticoagulation: Is the patient taking prescription anticoagulant and/or aspirin  prescribed/recommended by a physician? Yes   Was the anticoagulation regimen changed prior to Mohs? No     Anesthesia: Anesthesia method: local infiltration Local anesthetic: lidocaine  1% WITH epi  Procedure Details: Timeout: pre-procedure verification complete Procedure Prep: patient was prepped and draped in usual sterile fashion Prep type: chlorhexidine  Biopsy accession number: [id number] Frozen section biopsy performed: No   Specimen debulked: No   Pre-Op diagnosis: basal cell carcinoma MohsAIQ Surgical site (if tumor spans multiple areas, please select predominant area): vermilion lip Surgery side: right Surgical site (from skin exam): right cutaneous lip Pre-operative length (cm): 1.6 Pre-operative width (cm): 0.6 Indications for Mohs surgery: anatomic location where tissue conservation is critical and positive margins after excision Previously treated? Yes   Previous treatment type: excision  Micrographic Surgery Details: Post-operative length (cm): 2.1 Post-operative width (cm): 1.8 Number of Mohs stages: 2 Cumulative additional sections past 5 per stage: 0 Post surgery depth of defect: subcutaneous fat and skeletal muscle Is this a complex case (associate members only): No    Stage 1    Tumor features identified on Mohs section: basal carcinoma    Depth of tumor invasion after stage: subcutaneous fat    Perineural invasion: no perineural invasion  Stage 2    Tumor features identified on Mohs section: no tumor identified    Depth of tumor invasion after stage: subcutaneous fat and skeletal muscle    Perineural invasion: no perineural invasion  Patient tolerance of procedure: tolerated well, no immediate complications  Reconstruction: Was the defect reconstructed? Yes   Was reconstruction performed by the same Mohs surgeon? Yes   Setting of reconstruction: outpatient office When was reconstruction performed? same day Type of reconstruction: flap Type of flap: advancement   Flap area (cm2): 24  Opioids: Did the patient receive a prescription for  opioid/narcotic related to Mohs surgery?  Yes   Indications for opioid/narcotics: patient required additional pain relief despite trial of non-opioid analgesia  Antibiotics: Does patient meet AHA guidelines for endocarditis?: No   Does patient meet AHA guidelines for orthopedic prophylaxis?: No   Were antibiotics given on the day of surgery? Yes   When were antibiotics given? pre-operative and post-operative Did surgery breach mucosa, expose cartilage/bone, involve an area of lymphedema/inflamed/infected tissue? No   Indication for pre-operative antibiotics: endocarditis prophylaxis Indication for post-operative antibiotics: anatomic location  Related Medications oxyCODONE  (OXY IR/ROXICODONE ) 5 MG immediate release tablet Take 1 tablet (5 mg total) by mouth every 6 (six) hours as needed for up to 8 doses. mupirocin  ointment (BACTROBAN ) 2 % Apply 1 Application topically 2 (two) times daily for 8 doses. THROMBOCYTOPENIA (HCC)     Return in 10 days (on 07/20/2024) for suture removal.  I, Berwyn Lesches, Surg Tech III, am acting as scribe for RUFUS CHRISTELLA HOLY, MD.    07/10/2024  HISTORY OF PRESENT ILLNESS  Danielle Obrien is seen in consultation at the request of Dr. Leonce Birmingham for biopsy-proven Basal Cell Carcinoma of the right upper cutaneous with positive margins from excision. They note that the area has been present for about 6 months increasing in size with time.  There is no history of previous treatment.  Reports no other new or changing lesions and has no other complaints today.  Medications and allergies: see patient chart.  Review of systems: Reviewed 8 systems and notable for the above skin cancer.  All other systems reviewed are unremarkable/negative, unless noted in the HPI. Past medical history, surgical history, family history, social history were also reviewed and are noted in the chart/questionnaire.    PHYSICAL EXAMINATION  General: Well-appearing, in no acute  distress, alert and oriented x 4. Vitals reviewed in chart (if available).   Skin: Exam reveals a 1.6 x 0.6 cm erythematous papule and biopsy scar on the right cutaneous upper lip. There are rhytids, telangiectasias, and lentigines, consistent with photodamage.  Biopsy report(s) reviewed, confirming the diagnosis.   ASSESSMENT  1) Positive Margin from Excision, Nodular Basal Cell Carcinoma of the right cutaneous upper lip 2) photodamage 3) solar lentigines   PLAN   1. Due to location, size, histology, or recurrence and the likelihood of subclinical extension as well as the need to conserve normal surrounding tissue, the patient was deemed acceptable for Mohs micrographic surgery (MMS).  The nature and purpose of the procedure, associated benefits and risks including recurrence and scarring, possible complications such as pain, infection, and bleeding, and alternative methods of treatment if appropriate were discussed with the patient during consent. The lesion location was verified by the patient, by reviewing previous notes, pathology reports, and by photographs as well as angulation measurements if available.  Informed consent was reviewed and signed by the patient, and timeout was performed at 8:30 AM. See op note below.  2. For the photodamage and solar lentigines, sun protection discussed/information given on OTC sunscreens, and we recommend continued regular follow-up with primary dermatologist every 6 months or sooner for any growing, bleeding, or changing lesions. 3. Prognosis and future surveillance discussed. 4. Letter with treatment outcome sent to referring provider. 5. Pain acetaminophen /ibuprofen/oxycodone  5 mg  MOHS MICROGRAPHIC SURGERY AND RECONSTRUCTION  Initial size:   1.6 x 0.6 cm Surgical defect/wound size: 2.1 x 1.8 cm Anesthesia:    0.33% lidocaine  with 1:200,000 epinephrine  EBL:    <5 mL Complications:  None Repair type:   Adjacent Tissue Transfer (  Advancement  Flap) SQ suture:   5-0 Monocryl Cutaneous suture:  6-0 Plain gut Final size of the repair: 4.0 x 6.0 = 24 cm^2  Stages: 2  STAGE I: Anesthesia achieved with 0.5% lidocaine  with 1:200,000 epinephrine . ChloraPrep applied. 2 section(s) excised using Mohs technique (this includes total peripheral and deep tissue margin excision and evaluation with frozen sections, excised and interpreted by the same physician). The tumor was first debulked and then excised with an approx. 2 mm margin.  Hemostasis was achieved with electrocautery as needed.  The specimen was then oriented, subdivided/relaxed, inked, and processed using Mohs technique.    Frozen section analysis revealed a positive margin for islands of cells with peripheral palisading and a haphazard arrangement of the more central cells in the deep margin.    STAGE II: An additional 2 mm margin was excised.  Hemostasis was achieved with electrocautery as needed.  The specimen was then oriented, subdivided/relaxed, inked, and processed using Mohs technique. Evaluation of slides by the Mohs surgeon revealed clear tumor margins.   Reconstruction  PROCEDURE: Advancement Flap The nature of the procedure was discussed with the patient in detail, including alternatives.  The risks discussed included but not limited to potential for infection, bleeding, scar formation, and damage to underlying structures.  The patient understood the risks and signed the consent form (scanned into chart).  This wound was reconstructed with an advancement flap.  Local anesthesia was achieved with the anesthetic indicated above.  The operative site was prepped with a surgical antiseptic solution, and then draped with sterile towels to insure a sterile field.  The beveled edges of the wound were excised to 90 degrees relative to the surface skin plane.  The wound was undermined in all directions, and meticulous hemostasis was achieved with an electrosurgical device.  Relaxing  incisions were made, if necessary, for lateral tension release.  The tissue was advanced and closed centrally, and redundant tissue was trimmed as necessary.  The wound was sutured in a layered fashion to close potential dead space and to precisely and securely approximate the wound edges.  The dimensions of the flap were:  4.0 cm x 6.0 cm for a total flap surface area of 24 centimeters squared (cm2).    The wound was covered with petrolatum  and a dressing.  The patient understands the need to return immediately for any signs of infection to include swelling, pain, purulent discharge, localized warmth, or fever.  Contact information was provided to the patient (including after-hours pager numbers)    Documentation: I have reviewed the above documentation for accuracy and completeness, and I agree with the above.  RUFUS CHRISTELLA HOLY, MD

## 2024-07-10 NOTE — Patient Instructions (Signed)

## 2024-07-11 ENCOUNTER — Encounter: Payer: Self-pay | Admitting: Dermatology

## 2024-07-11 ENCOUNTER — Other Ambulatory Visit: Payer: Self-pay | Admitting: Dermatology

## 2024-07-11 ENCOUNTER — Ambulatory Visit (INDEPENDENT_AMBULATORY_CARE_PROVIDER_SITE_OTHER): Admitting: Dermatology

## 2024-07-11 DIAGNOSIS — Z48817 Encounter for surgical aftercare following surgery on the skin and subcutaneous tissue: Secondary | ICD-10-CM

## 2024-07-11 DIAGNOSIS — I350 Nonrheumatic aortic (valve) stenosis: Secondary | ICD-10-CM

## 2024-07-11 DIAGNOSIS — Z954 Presence of other heart-valve replacement: Secondary | ICD-10-CM

## 2024-07-11 DIAGNOSIS — R609 Edema, unspecified: Secondary | ICD-10-CM

## 2024-07-11 DIAGNOSIS — C4491 Basal cell carcinoma of skin, unspecified: Secondary | ICD-10-CM

## 2024-07-11 DIAGNOSIS — Q2381 Bicuspid aortic valve: Secondary | ICD-10-CM

## 2024-07-11 DIAGNOSIS — Z7901 Long term (current) use of anticoagulants: Secondary | ICD-10-CM

## 2024-07-11 DIAGNOSIS — Z85828 Personal history of other malignant neoplasm of skin: Secondary | ICD-10-CM

## 2024-07-11 DIAGNOSIS — D696 Thrombocytopenia, unspecified: Secondary | ICD-10-CM

## 2024-07-11 MED ORDER — OXYCODONE HCL 5 MG PO TABS: 5.0000 mg | ORAL_TABLET | Freq: Four times a day (QID) | ORAL | 0 refills | Status: DC | PRN

## 2024-07-11 NOTE — Progress Notes (Signed)
   Follow Up Visit   Subjective  Danielle Obrien is a 43 y.o. female who presents for the following: follow up from Mohs surgery   The patient presents for follow up from Mohs surgery for a BCC on the right cutaneous lip, treated on 07/10/2024, repaired with an Advancement Flap. The patient has been bandaging the wound as directed. The endorse the following concerns: swelling and bleeding.   Patient called out office this afternoon with concern of bleeding and substantial swelling. We advised that she come to the office for evaluation.  Patient reports that she slept sitting up last night. She has applied ice-packs around the bandage for 20 minutes every hour. She declines smoking since her surgery yesterday. She has been taking the oxycodone  as prescribed, but only experiences relief for 1 hour after taking.   The following portions of the chart were reviewed this encounter and updated as appropriate: medications, allergies, medical history  Review of Systems:  No other skin or systemic complaints except as noted in HPI or Assessment and Plan.  Objective  Well appearing patient in no apparent distress; mood and affect are within normal limits.  A focal examination was performed including the face. All findings within normal limits unless otherwise noted below.  Healing wound with mild erythema  Relevant physical exam findings are noted in the Assessment and Plan.       Assessment & Plan   Substantial Edema of the right Cheek, and Right upper Lip- Likely due to hx of thrombocytopenia - Acute hematoma no visualized - Normal variant based on procedure - Recommend to continue firm pressure with ice packs every hour for 20 minutes for the next 48 hours - Emphasized importance of refraining from smoking - Recommend continue to sleep in elevated position - Will recheck tomorrow - Oxycodone  refilled  Wound s/p Mohs for [facility] on the right cutaneous lip, treated on 07/10/2024, repaired  with an advancement flap.  - Reassured that swelling and bleeding is consistent with her thrombocytopenia.  - No evidence of infection - No induration, purulence, dehiscence, or tenderness out of proportion to the clinical exam, see photo above. - Reinforced the importance of not smoking during the healing period. - Bandage removed and wound cleansed. Ice-pack applied for 20 minutes in office. - Advised patient to apply ice-packs with pressure at home for 20 minutes every hour - Patient instructed to leave bandage in place. This will be removed at her appointment tomorrow.   SWELLING   THROMBOCYTOPENIA (HCC)   BASAL CELL CARCINOMA (BCC), UNSPECIFIED SITE   Related Medications oxyCODONE  (OXY IR/ROXICODONE ) 5 MG immediate release tablet Take 1 tablet (5 mg total) by mouth every 6 (six) hours as needed for up to 8 doses. mupirocin  ointment (BACTROBAN ) 2 % Apply 1 Application topically 2 (two) times daily for 8 doses.  Return in 1 day (on 07/12/2024) for follow up on Wednesday and Thursday.  LILLETTE Rollene Gobble, RN, am acting as scribe for RUFUS CHRISTELLA HOLY, MD .  Rollene Gobble, RN saw the patient and discussed the case with  me via phone and via photos. Recommendations provided. Will follow up tomorrow.  Documentation: I have reviewed the above documentation for accuracy and completeness, and I agree with the above.  RUFUS CHRISTELLA HOLY, MD

## 2024-07-12 ENCOUNTER — Ambulatory Visit (INDEPENDENT_AMBULATORY_CARE_PROVIDER_SITE_OTHER): Admitting: Dermatology

## 2024-07-12 ENCOUNTER — Encounter: Payer: Self-pay | Admitting: Dermatology

## 2024-07-12 ENCOUNTER — Telehealth: Payer: Self-pay

## 2024-07-12 DIAGNOSIS — Z48817 Encounter for surgical aftercare following surgery on the skin and subcutaneous tissue: Secondary | ICD-10-CM

## 2024-07-12 DIAGNOSIS — C4491 Basal cell carcinoma of skin, unspecified: Secondary | ICD-10-CM

## 2024-07-12 DIAGNOSIS — S01501A Unspecified open wound of lip, initial encounter: Secondary | ICD-10-CM

## 2024-07-12 DIAGNOSIS — D696 Thrombocytopenia, unspecified: Secondary | ICD-10-CM

## 2024-07-12 DIAGNOSIS — R609 Edema, unspecified: Secondary | ICD-10-CM

## 2024-07-12 DIAGNOSIS — Z85828 Personal history of other malignant neoplasm of skin: Secondary | ICD-10-CM

## 2024-07-12 NOTE — Telephone Encounter (Signed)
 Patient called last night saying she was still bleeding. She reports that the swelling has improved this morning after putting the ice-packs on the area all night. She says that there is still a small amount of bleeding from the corner of her mouth. I reassured her that this was to be expected with her low platelets and her large repair. Patient verbalized understanding.  We will see her later today in follow up.

## 2024-07-12 NOTE — Progress Notes (Signed)
   Follow Up Visit   Subjective  Danielle Obrien is a 44 y.o. female who presents for the following: follow up from Mohs surgery   The patient presents for follow up from Mohs surgery for a BCC on the right cutaneous lip, treated on 07/10/2024, repaired with an advancement flap. The patient has been bandaging the wound as directed. The endorse the following concerns: swelling and bleeding.   Patient was seen in the office yesterday, 8/12/20205, for swelling and bleeding. She is being seen today to remove her bandage and access the swelling.  The following portions of the chart were reviewed this encounter and updated as appropriate: medications, allergies, medical history  Review of Systems:  No other skin or systemic complaints except as noted in HPI or Assessment and Plan.  Objective  Well appearing patient in no apparent distress; mood and affect are within normal limits.  A focal examination was performed including the face. All findings within normal limits unless otherwise noted below.  Healing wound with mild erythema  Relevant physical exam findings are noted in the Assessment and Plan.    Assessment & Plan   Substantial Edema of the right Cheek, and Right upper Lip- Likely due to hx of thrombocytopenia - Acute hematoma not visualized - Normal variant based on procedure - Recommend to continue firm pressure with ice packs every hour for 20 minutes for the next 48 hours - Emphasized importance of refraining from smoking - Recommend continue to sleep in elevated position - Will recheck tomorrow - Discussed that Oxycodone  can be refilled tomorrow. Patient confirms that she has enough until then.  Wound s/p Mohs for [facility] on the right cutaneous lip, treated on 07/10/2024, repaired with Advancement flap - Reassured that swelling and bleeding is consistent with her thrombocytopenia.  - No evidence of infection - No induration, purulence, dehiscence, or tenderness out of  proportion to the clinical exam, see photo above. - Reinforced the importance of not smoking during the healing period. - Bandage removed and wound cleansed. Ice-pack applied for 20 minutes in office. - Advised patient to apply ice-packs with pressure at home for 20 minutes every hour - Patient instructed to leave bandage in place. This will be removed at her appointment tomorrow.  Return in about 1 day (around 07/13/2024).  LILLETTE Rollene Gobble, RN, am acting as scribe for RUFUS CHRISTELLA HOLY, MD .   Documentation: I have reviewed the above documentation for accuracy and completeness, and I agree with the above.  RUFUS CHRISTELLA HOLY, MD

## 2024-07-13 ENCOUNTER — Ambulatory Visit

## 2024-07-13 ENCOUNTER — Ambulatory Visit (INDEPENDENT_AMBULATORY_CARE_PROVIDER_SITE_OTHER): Admitting: Dermatology

## 2024-07-13 ENCOUNTER — Encounter: Payer: Self-pay | Admitting: Dermatology

## 2024-07-13 ENCOUNTER — Inpatient Hospital Stay: Payer: Managed Care, Other (non HMO)

## 2024-07-13 DIAGNOSIS — Z85828 Personal history of other malignant neoplasm of skin: Secondary | ICD-10-CM

## 2024-07-13 DIAGNOSIS — Z954 Presence of other heart-valve replacement: Secondary | ICD-10-CM

## 2024-07-13 DIAGNOSIS — R609 Edema, unspecified: Secondary | ICD-10-CM

## 2024-07-13 DIAGNOSIS — S01501A Unspecified open wound of lip, initial encounter: Secondary | ICD-10-CM

## 2024-07-13 DIAGNOSIS — D071 Carcinoma in situ of vulva: Secondary | ICD-10-CM | POA: Diagnosis not present

## 2024-07-13 DIAGNOSIS — C4491 Basal cell carcinoma of skin, unspecified: Secondary | ICD-10-CM

## 2024-07-13 DIAGNOSIS — I350 Nonrheumatic aortic (valve) stenosis: Secondary | ICD-10-CM

## 2024-07-13 DIAGNOSIS — D696 Thrombocytopenia, unspecified: Secondary | ICD-10-CM

## 2024-07-13 DIAGNOSIS — D693 Immune thrombocytopenic purpura: Secondary | ICD-10-CM

## 2024-07-13 DIAGNOSIS — Z7901 Long term (current) use of anticoagulants: Secondary | ICD-10-CM

## 2024-07-13 DIAGNOSIS — Z48817 Encounter for surgical aftercare following surgery on the skin and subcutaneous tissue: Secondary | ICD-10-CM

## 2024-07-13 LAB — CBC WITH DIFFERENTIAL/PLATELET
Abs Immature Granulocytes: 0.01 K/uL (ref 0.00–0.07)
Basophils Absolute: 0.1 K/uL (ref 0.0–0.1)
Basophils Relative: 1 %
Eosinophils Absolute: 0.3 K/uL (ref 0.0–0.5)
Eosinophils Relative: 4 %
HCT: 45.5 % (ref 36.0–46.0)
Hemoglobin: 15.8 g/dL — ABNORMAL HIGH (ref 12.0–15.0)
Immature Granulocytes: 0 %
Lymphocytes Relative: 35 %
Lymphs Abs: 2.3 K/uL (ref 0.7–4.0)
MCH: 31.8 pg (ref 26.0–34.0)
MCHC: 34.7 g/dL (ref 30.0–36.0)
MCV: 91.5 fL (ref 80.0–100.0)
Monocytes Absolute: 0.6 K/uL (ref 0.1–1.0)
Monocytes Relative: 9 %
Neutro Abs: 3.4 K/uL (ref 1.7–7.7)
Neutrophils Relative %: 51 %
Platelets: 142 K/uL — ABNORMAL LOW (ref 150–400)
RBC: 4.97 MIL/uL (ref 3.87–5.11)
RDW: 12.9 % (ref 11.5–15.5)
WBC: 6.7 K/uL (ref 4.0–10.5)
nRBC: 0 % (ref 0.0–0.2)

## 2024-07-13 MED ORDER — OXYCODONE HCL 5 MG PO TABS: 5.0000 mg | ORAL_TABLET | Freq: Four times a day (QID) | ORAL | 0 refills | Status: DC | PRN

## 2024-07-13 NOTE — Patient Instructions (Signed)

## 2024-07-13 NOTE — Progress Notes (Signed)
   Follow Up Visit   Subjective  Danielle Obrien is a 43 y.o. female who presents for the following: follow up from Mohs surgery   he patient presents for follow up from Mohs surgery for a BCC on the right cutaneous lip, treated on 07/10/2024, repaired with an advancement flap. The patient has been bandaging the wound as directed. The endorse the following concerns: swelling and bleeding.    Patient was seen in the office, 8/12/20205 and 07/12/2024, for bandage changes due to swelling and bleeding. She is returning today for another bandage change.   The following portions of the chart were reviewed this encounter and updated as appropriate: medications, allergies, medical history  Review of Systems:  No other skin or systemic complaints except as noted in HPI or Assessment and Plan.  Objective  Well appearing patient in no apparent distress; mood and affect are within normal limits.  A focal examination was performed including the face. All findings within normal limits unless otherwise noted below.  Healing wound with mild erythema  Relevant physical exam findings are noted in the Assessment and Plan.    Assessment & Plan   Substantial Edema of the right Cheek, and Right upper Lip- Likely due to hx of thrombocytopenia- improving - Acute hematoma not visualized - Normal variant based on procedure - Recommend to continue firm pressure with ice packs every hour for 20 minutes for the next 48 hours - Emphasized importance of refraining from smoking - Recommend continue to sleep in elevated position - Will recheck Monday. - Oxycodone  refilled.   Wound s/p Mohs for [facility] on the right cutaneous lip, treated on 07/10/2024, repaired with Advancement flap - Reassured that swelling and bleeding is consistent with her thrombocytopenia.  - No evidence of infection - No induration, purulence, dehiscence, or tenderness out of proportion to the clinical exam, see photo above. - Reinforced the  importance of not smoking during the healing period. - Bandage removed and wound cleansed. Ice-pack applied for 20 minutes in office. - Advised patient to apply ice-packs with pressure at home for 20 minutes every hour - Patient instructed to remove the bandage tomorrow and begin applying mupirocin  ointment and bandage.  Return in 4 days (on 07/17/2024) for wound check.  Danielle Rollene Gobble, RN, am acting as scribe for Danielle CHRISTELLA HOLY, MD .   Documentation: I have reviewed the above documentation for accuracy and completeness, and I agree with the above.  Danielle CHRISTELLA HOLY, MD

## 2024-07-17 ENCOUNTER — Encounter: Payer: Self-pay | Admitting: Dermatology

## 2024-07-17 ENCOUNTER — Ambulatory Visit (INDEPENDENT_AMBULATORY_CARE_PROVIDER_SITE_OTHER): Admitting: Dermatology

## 2024-07-17 ENCOUNTER — Ambulatory Visit: Attending: Cardiology | Admitting: *Deleted

## 2024-07-17 VITALS — BP 130/85 | HR 71

## 2024-07-17 DIAGNOSIS — Q2381 Bicuspid aortic valve: Secondary | ICD-10-CM

## 2024-07-17 DIAGNOSIS — Z954 Presence of other heart-valve replacement: Secondary | ICD-10-CM

## 2024-07-17 DIAGNOSIS — C4491 Basal cell carcinoma of skin, unspecified: Secondary | ICD-10-CM

## 2024-07-17 DIAGNOSIS — D696 Thrombocytopenia, unspecified: Secondary | ICD-10-CM

## 2024-07-17 DIAGNOSIS — R609 Edema, unspecified: Secondary | ICD-10-CM

## 2024-07-17 DIAGNOSIS — S01501D Unspecified open wound of lip, subsequent encounter: Secondary | ICD-10-CM

## 2024-07-17 DIAGNOSIS — Z48817 Encounter for surgical aftercare following surgery on the skin and subcutaneous tissue: Secondary | ICD-10-CM

## 2024-07-17 DIAGNOSIS — Z7901 Long term (current) use of anticoagulants: Secondary | ICD-10-CM

## 2024-07-17 LAB — POCT INR: INR: 1.5 — AB (ref 2.0–3.0)

## 2024-07-17 NOTE — Progress Notes (Signed)
   Follow Up Visit   Subjective  Danielle Obrien is a 43 y.o. female who presents for the following: follow up from Mohs surgery   The patient presents for follow up from Mohs surgery for a BCC on the right cutaneous lip, treated on 07/10/24, repaired with advancement flap. The patient has been bandaging the wound as directed. The endorse the following concerns: swelling and pain. She states that things feel better than they did last week. She is putting mupirocin  on the wound and covering it with a bandage.   Sign language interpreter at bedside.   The following portions of the chart were reviewed this encounter and updated as appropriate: medications, allergies, medical history  Review of Systems:  No other skin or systemic complaints except as noted in HPI or Assessment and Plan.  Objective  Well appearing patient in no apparent distress; mood and affect are within normal limits.  A focal examination was performed including scalp, head, face and lip. All findings within normal limits unless otherwise noted below.  Healing wound with mild erythema  Relevant physical exam findings are noted in the Assessment and Plan.    Assessment & Plan   Healing Wound s/p Mohs for [facility], treated on 07/10/24, repaired with advancement flap - Reassured that wound is healing well - No evidence of infection - No swelling, induration, purulence, dehiscence, or tenderness out of proportion to the clinical exam, see photo above - Discussed that scars take up to 12 months to mature from the date of surgery - Recommend SPF 30+ to scar daily to prevent purple color from UV exposure during scar maturation process - Discussed that erythema and raised appearance of scar will fade over the next 4-6 months - OK to start scar massage at 4-6 weeks post-op - Can consider silicone based products for scar healing starting at 6 weeks post-op - Ok to continue ointment daily to wound under a bandage for another 4 days  until you return for suture removal.  Edema of the right Cheek, and Right upper Lip- Likely due to hx of thrombocytopenia- improving - Acute hematoma not visualized - Normal variant based on procedure - Recommend to continue firm pressure with ice packs every hour for 20 minutes for the next 48 hours - Emphasized importance of refraining from smoking - Recommend continue to sleep in elevated position  HISTORY OF BASAL CELL CARCINOMA OF THE SKIN - No evidence of recurrence today - Recommend regular full body skin exams - Recommend daily broad spectrum sunscreen SPF 30+ to sun-exposed areas, reapply every 2 hours as needed.  - Call if any new or changing lesions are noted between office visits   Return in 3 days (on 07/20/2024) for suture removal.  I, Berwyn Lesches, Surg Tech III, am acting as scribe for RUFUS CHRISTELLA HOLY, MD.   Documentation: I have reviewed the above documentation for accuracy and completeness, and I agree with the above.  RUFUS CHRISTELLA HOLY, MD

## 2024-07-17 NOTE — Patient Instructions (Addendum)
 Description   *PLEASE PUT ON APPT NO ONSITE INTERPRETER NEEDED*  Today take 1.5 tablets of warfarin and tomorrow take 2 tablets of warfarin then START taking warfarin 1.5 tablets daily except for 1 tablet on Monday, Wednesday, and Friday. Recheck INR in 1 week, please let us  know of any upcoming procedures or medication changes. Coumadin  Clinic [phone]

## 2024-07-17 NOTE — Progress Notes (Signed)
 INR-1.5; Please see anticoagulation encounter  Today take 1.5 tablets of warfarin and tomorrow take 2 tablets of warfarin then START taking warfarin 1.5 tablets daily except for 1 tablet on Monday, Wednesday, and Friday. Recheck INR in 1 week, please let us  know of any upcoming procedures or medication changes. Coumadin  Clinic [phone]

## 2024-07-18 ENCOUNTER — Encounter: Payer: Self-pay | Admitting: Dermatology

## 2024-07-18 MED ORDER — OXYCODONE HCL 5 MG PO TABS: 5.0000 mg | ORAL_TABLET | Freq: Four times a day (QID) | ORAL | 0 refills | Status: DC | PRN

## 2024-07-18 NOTE — Addendum Note (Signed)
 Addended by: COREY RUFUS HERO on: 07/18/2024 03:48 PM   Modules accepted: Orders

## 2024-07-20 ENCOUNTER — Encounter: Payer: Self-pay | Admitting: Dermatology

## 2024-07-20 ENCOUNTER — Ambulatory Visit (INDEPENDENT_AMBULATORY_CARE_PROVIDER_SITE_OTHER): Admitting: Dermatology

## 2024-07-20 DIAGNOSIS — R609 Edema, unspecified: Secondary | ICD-10-CM

## 2024-07-20 DIAGNOSIS — Z85828 Personal history of other malignant neoplasm of skin: Secondary | ICD-10-CM

## 2024-07-20 DIAGNOSIS — C4491 Basal cell carcinoma of skin, unspecified: Secondary | ICD-10-CM

## 2024-07-20 DIAGNOSIS — D696 Thrombocytopenia, unspecified: Secondary | ICD-10-CM

## 2024-07-20 DIAGNOSIS — Z952 Presence of prosthetic heart valve: Secondary | ICD-10-CM

## 2024-07-20 DIAGNOSIS — S01501D Unspecified open wound of lip, subsequent encounter: Secondary | ICD-10-CM

## 2024-07-20 DIAGNOSIS — Z862 Personal history of diseases of the blood and blood-forming organs and certain disorders involving the immune mechanism: Secondary | ICD-10-CM

## 2024-07-20 DIAGNOSIS — L905 Scar conditions and fibrosis of skin: Secondary | ICD-10-CM

## 2024-07-20 NOTE — Progress Notes (Unsigned)
   Follow Up Visit   Subjective  Danielle Obrien is a 43 y.o. female who presents for the following: follow up from Mohs surgery   The patient presents for follow up from Mohs surgery for a BCC on the right upper lip, treated on 07/04/24, repaired with advancement flap. The patient has been bandaging the wound as directed. The endorse the following concerns: swellings and pain. She is accompanied by her ASL interpreter.  The following portions of the chart were reviewed this encounter and updated as appropriate: medications, allergies, medical history  Review of Systems:  No other skin or systemic complaints except as noted in HPI or Assessment and Plan.  Objective  Well appearing patient in no apparent distress; mood and affect are within normal limits.  A focal examination was performed including scalp, head, and face. All findings within normal limits unless otherwise noted below.  Healing wound with mild erythema  Relevant physical exam findings are noted in the Assessment and Plan.           Assessment & Plan   Healing Wound s/p Mohs for [facility], treated on 07/10/24, repaired with advancement flap- much improved - Reassured that wound is healing well - Sutures removed today - No evidence of infection - No swelling, induration, purulence, dehiscence, or tenderness out of proportion to the clinical exam, see photo above - Discussed that scars take up to 12 months to mature from the date of surgery - Recommend SPF 30+ to scar daily to prevent purple color from UV exposure during scar maturation process - Discussed that erythema and raised appearance of scar will fade over the next 4-6 months - OK to start scar massage at 4-6 weeks post-op - Can consider silicone based products for scar healing starting at 6 weeks post-op - Ok to continue ointment daily to wound under a bandage for another 2 weeks or until completley healed  History of Basal Cell Carcinoma- positive margins from  excision; with history of Mechanical Heart Valve and Thrombocytopenia - Referred for Mohs micrographic surgery by Plastic Surgery for positive margin BCC located on the right upper cutaneous lip; completed on 07/10/2024 - Past medical history notable for mechanical heart valve (2018) and thrombocytopenia (most recent platelet count of 149K in April 2025)   Return in about 2 weeks (around 08/03/2024) for wound check.  I, Berwyn Lesches, Surg Tech III, am acting as scribe for RUFUS CHRISTELLA HOLY, MD.   Documentation: I have reviewed the above documentation for accuracy and completeness, and I agree with the above.  RUFUS CHRISTELLA HOLY, MD

## 2024-07-20 NOTE — Patient Instructions (Signed)

## 2024-07-26 ENCOUNTER — Ambulatory Visit

## 2024-07-26 ENCOUNTER — Other Ambulatory Visit: Payer: Self-pay | Admitting: Family Medicine

## 2024-07-27 ENCOUNTER — Ambulatory Visit: Admitting: Dermatology

## 2024-07-28 ENCOUNTER — Other Ambulatory Visit: Payer: Self-pay | Admitting: Cardiology

## 2024-07-28 DIAGNOSIS — Z954 Presence of other heart-valve replacement: Secondary | ICD-10-CM

## 2024-08-01 ENCOUNTER — Other Ambulatory Visit: Payer: Self-pay | Admitting: Internal Medicine

## 2024-08-01 DIAGNOSIS — I10 Essential (primary) hypertension: Secondary | ICD-10-CM

## 2024-08-01 DIAGNOSIS — F3177 Bipolar disorder, in partial remission, most recent episode mixed: Secondary | ICD-10-CM

## 2024-08-03 ENCOUNTER — Ambulatory Visit: Attending: Cardiology | Admitting: *Deleted

## 2024-08-03 ENCOUNTER — Ambulatory Visit (INDEPENDENT_AMBULATORY_CARE_PROVIDER_SITE_OTHER): Admitting: Dermatology

## 2024-08-03 ENCOUNTER — Encounter: Payer: Self-pay | Admitting: Dermatology

## 2024-08-03 DIAGNOSIS — Z7901 Long term (current) use of anticoagulants: Secondary | ICD-10-CM

## 2024-08-03 DIAGNOSIS — Z85828 Personal history of other malignant neoplasm of skin: Secondary | ICD-10-CM

## 2024-08-03 DIAGNOSIS — D696 Thrombocytopenia, unspecified: Secondary | ICD-10-CM

## 2024-08-03 DIAGNOSIS — Z954 Presence of other heart-valve replacement: Secondary | ICD-10-CM

## 2024-08-03 DIAGNOSIS — C4491 Basal cell carcinoma of skin, unspecified: Secondary | ICD-10-CM

## 2024-08-03 DIAGNOSIS — L905 Scar conditions and fibrosis of skin: Secondary | ICD-10-CM

## 2024-08-03 DIAGNOSIS — S01501D Unspecified open wound of lip, subsequent encounter: Secondary | ICD-10-CM

## 2024-08-03 LAB — POCT INR: POC INR: 1.2

## 2024-08-03 NOTE — Progress Notes (Signed)
 INR 1.2; Please see anticoagulation encounter  Lab Results  Component Value Date   INR 1.2 08/03/2024   INR 1.5 (A) 07/17/2024   INR 1.4 06/29/2024   Description    Take 2 tablets of warfarin today and then START taking warfarin 1.5 tablets daily except for 1 tablet on Monday and Wednesday. Recheck INR in 1 week. Coumadin  Clinic [phone]

## 2024-08-03 NOTE — Patient Instructions (Addendum)

## 2024-08-03 NOTE — Patient Instructions (Signed)
 Description    Take 2 tablets of warfarin today and then START taking warfarin 1.5 tablets daily except for 1 tablet on Monday and Wednesday. Recheck INR in 1 week. Coumadin  Clinic [phone]

## 2024-08-03 NOTE — Progress Notes (Signed)
   Follow Up Visit   Subjective  Danielle Obrien is a 43 y.o. female who presents for the following: follow up from Mohs surgery   The patient presents for follow up from Mohs surgery for a BCC on the right upper lip, treated on 07/04/24, repaired with advancement flap. The patient has been bandaging the wound as directed. The endorse the following concerns: none  The following portions of the chart were reviewed this encounter and updated as appropriate: medications, allergies, medical history  Review of Systems:  No other skin or systemic complaints except as noted in HPI or Assessment and Plan.  Objective  Well appearing patient in no apparent distress; mood and affect are within normal limits.  A focal examination was performed including scalp, head, face. All findings within normal limits unless otherwise noted below.  Healing wound with mild erythema  Relevant physical exam findings are noted in the Assessment and Plan.       Assessment & Plan    Healing Wound/Scar s/p Mohs for positive margin BCC on the right cutaneous lip, treated on 07/04/24, repaired with advancement flap - Reassured that wound is healing well - No evidence of infection - No swelling, induration, purulence, dehiscence, or tenderness out of proportion to the clinical exam, see photo above - Discussed that scars take up to 12 months to mature from the date of surgery - Recommend SPF 30+ to scar daily to prevent purple color from UV exposure during scar maturation process - Discussed that erythema and raised appearance of scar will fade over the next 4-6 months - OK to start scar massage at 4-6 weeks post-op - Can consider silicone based products for scar healing starting at 6 weeks post-op - Ok to discontinue ointment daily to wound  HISTORY OF BASAL CELL CARCINOMA OF THE SKIN - No evidence of recurrence today - Recommend regular full body skin exams - Recommend daily broad spectrum sunscreen SPF 30+ to  sun-exposed areas, reapply every 2 hours as needed.  - Call if any new or changing lesions are noted between office visits  Thrombocytopenia-  Last Platelets- 142k (07/13/2024)   Return in about 2 weeks (around 08/17/2024) for scar check.  I, Darice Smock, CMA, am acting as scribe for RUFUS CHRISTELLA HOLY, MD.   Documentation: I have reviewed the above documentation for accuracy and completeness, and I agree with the above.  RUFUS CHRISTELLA HOLY, MD

## 2024-08-10 ENCOUNTER — Ambulatory Visit: Attending: Cardiology

## 2024-08-10 DIAGNOSIS — Z954 Presence of other heart-valve replacement: Secondary | ICD-10-CM

## 2024-08-10 DIAGNOSIS — Q2381 Bicuspid aortic valve: Secondary | ICD-10-CM

## 2024-08-10 DIAGNOSIS — Z7901 Long term (current) use of anticoagulants: Secondary | ICD-10-CM | POA: Diagnosis not present

## 2024-08-10 DIAGNOSIS — I350 Nonrheumatic aortic (valve) stenosis: Secondary | ICD-10-CM

## 2024-08-10 LAB — POCT INR: INR: 1.6 — AB (ref 2.0–3.0)

## 2024-08-10 NOTE — Patient Instructions (Signed)
 Take 2.5 tablets today only then continue taking warfarin 1.5 tablets daily except for 1 tablet on Monday and Wednesday. Recheck INR in 2 week. Coumadin  Clinic [phone]

## 2024-08-10 NOTE — Progress Notes (Signed)
 INR 1.6  Please see anticoagulation encounter Take 2.5 tablets today only then continue taking warfarin 1.5 tablets daily except for 1 tablet on Monday and Wednesday. Recheck INR in 2 week. Coumadin  Clinic [phone]

## 2024-08-12 ENCOUNTER — Other Ambulatory Visit: Payer: Self-pay | Admitting: Cardiology

## 2024-08-12 DIAGNOSIS — Z954 Presence of other heart-valve replacement: Secondary | ICD-10-CM

## 2024-08-14 NOTE — Telephone Encounter (Signed)
 Warfarin 7.5mg  refill 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  Last INR 08/10/24 Last OV 05/12/24 via Tele Visit

## 2024-08-16 ENCOUNTER — Ambulatory Visit: Admitting: Dermatology

## 2024-08-21 ENCOUNTER — Ambulatory Visit: Attending: Cardiology

## 2024-08-21 DIAGNOSIS — I350 Nonrheumatic aortic (valve) stenosis: Secondary | ICD-10-CM

## 2024-08-21 DIAGNOSIS — Z7901 Long term (current) use of anticoagulants: Secondary | ICD-10-CM

## 2024-08-21 DIAGNOSIS — Z954 Presence of other heart-valve replacement: Secondary | ICD-10-CM | POA: Diagnosis not present

## 2024-08-21 DIAGNOSIS — Q2381 Bicuspid aortic valve: Secondary | ICD-10-CM

## 2024-08-21 LAB — POCT INR: INR: 2.3 (ref 2.0–3.0)

## 2024-08-21 NOTE — Progress Notes (Signed)
 Description   INR 2.3, Continue taking warfarin 1.5 tablets daily except for 1 tablet on Mondays, Wednesdays, and Fridays. Recheck INR in 3 weeks. Coumadin  Clinic [phone]

## 2024-08-21 NOTE — Patient Instructions (Signed)
 Description   INR 2.3, Continue taking warfarin 1.5 tablets daily except for 1 tablet on Mondays, Wednesdays, and Fridays. Recheck INR in 3 weeks. Coumadin  Clinic [phone]

## 2024-08-23 NOTE — Progress Notes (Deleted)
 [facility] [address] [place] [address] Phone: [phone] Subjective:    I'm seeing this patient by the request  of:  Joshua Debby CROME, MD  CC: Multiple problems follow-up  YEP:Dlagzrupcz  06/22/2024 Contusion, patient is on anticoagulation.  Discussed icing, Arnica, discussed heel lifts.  Follow-up with me again in 3 months     Updated 08/24/2024 Danielle Obrien is a 43 y.o. female coming in with complaint of L calf pain patient did have a very large contusion previously and was on anticoagulation secondary to aortic valve replacement.  Does take tramadol  for pain and is on a chronic pain contract.  Patient is also taking Cymbalta  40 mg daily Needs repeat drug monitoring    Past Medical History:  Diagnosis Date   ADD (attention deficit disorder)    Alcohol abuse, in remission 2012   per pt in remission since 2012   Anticoagulated on Coumadin     coumadin --- managed by cardiology/ coumadin  clinic   Arthritis    In hips   Asthma    allergy induced, rare inhaler use   Auditory processing disorder    per pt very HOH, first language ASL   Basal cell carcinoma    Bipolar disorder (HCC)    Chronic left hip pain    Chronic vertigo    Complication of anesthesia    per pt due to auditory processing disorder pt is unable to hear when waking up and also gets very anxious   Family history of uterine cancer    GAD (generalized anxiety disorder)    GERD (gastroesophageal reflux disease)    History of cervical dysplasia    History of condyloma acuminatum 2017   s/p laser ablation vulva   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   biopsy and CO2 laser ablation   HOH (hard of hearing)    per pt very HOH due to auditroy processing disorder   Hyperlipidemia    Hypertension    Muscle spasms of both lower extremities    both hips   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   23 mm Sorin  [employer] [place] bileaflet mechanical valve via right anterior mini thoracotomy   Vulvar dysplasia    Past Surgical History:  Procedure Laterality Date   ANTERIOR CRUCIATE LIGAMENT REPAIR  1993   AORTIC VALVE REPLACEMENT N/A 11/03/2017   Procedure: MINIMALLY INVASIVE AORTIC VALVE REPLACEMENT( MINI THORACOTOMY);  Surgeon: Dusty Sudie DEL, MD;  Location: [facility] OR;  Service: Open Heart Surgery;  Laterality: N/A;   CERVICAL BIOPSY  W/ LOOP ELECTRODE EXCISION  2010   CIN III w/extension to glands   CHOLECYSTECTOMY N/A 07/11/2023   Procedure: LAPAROSCOPIC CHOLECYSTECTOMY WITH POSSIBLE INTRAOPERATIVE CHOLANGIOGRAM;  Surgeon: Signe Mitzie LABOR, MD;  Location: [facility] OR;  Service: General;  Laterality: N/A;   CLOSED REDUCTION HIP DISLOCATION Left 1981   CO2 LASER APPLICATION N/A 05/12/2022   Procedure: CO2 LASER APPLICATION TO VULVA;  Surgeon: Viktoria Comer SAUNDERS, MD;  Location[facility];  Service: Gynecology;  Laterality: N/A;   COLPOSCOPY  10/30/2008   CIN I & II   COLPOSCOPY  07/31/2000   Neg. ECC   COLPOSCOPY  06/30/2001   CIN I   COLPOSCOPY  08/30/2004   ECC--atypia   COLPOSCOPY N/A 01/21/2016   Procedure: COLPOSCOPY with vaginal biopsy with CO 2 Laser of Vaginal and vulvar condyloma;  Surgeon: Bobie FORBES Cathlyn JAYSON Nikki, MD;  Location: [facility] ORS;  Service: Gynecology;  Laterality: N/A;  [name] will be here 2/21 for 1115 case confirmed 01/16/15 - TS   HARDWARE REMOVAL Left 1992   Left hip   INGUINAL HERNIA REPAIR Bilateral    1981;  [id number]   LEFT AND RIGHT HEART CATHETERIZATION WITH CORONARY ANGIOGRAM N/A 05/18/2014   Procedure: LEFT AND RIGHT HEART CATHETERIZATION WITH CORONARY ANGIOGRAM;  Surgeon: Candyce GORMAN Reek, MD;  Location: [facility] C[facility] LAB;  Service: Cardiovascular;  Laterality: N/A;   LESION EXCISION WITH COMPLEX REPAIR N/A 05/23/2024   Procedure: LESION EXCISION WITH COMPLEX REPAIR;  Surgeon: Waddell Leonce NOVAK, MD;  Location: [facility];  Service: Plastics;   Laterality: N/A;  Excision of facial lesion, right upper lip   LESION REMOVAL Right 05/12/2022   Procedure: ANTONETTE OF RIGHT CHEEK CYST;  Surgeon: Elisabeth Craig GORMAN, MD;  Location: [facility];  Service: Plastics;  Laterality: Right;   LYMPH NODE BIOPSY  1995   NASAL SEPTUM SURGERY  2002   ORIF HIP FRACTURE Left 1990   per pt and  took out growth plate in Right knee   ROBOTIC ASSISTED TOTAL HYSTERECTOMY  02/26/2009   @[facility]    TEE WITHOUT CARDIOVERSION N/A 11/03/2017   Procedure: TRANSESOPHAGEAL ECHOCARDIOGRAM (TEE);  Surgeon: Dusty Sudie DEL, MD;  Location: [facility] OR;  Service: Open Heart Surgery;  Laterality: N/A;   TONSILLECTOMY AND ADENOIDECTOMY  1990   TYMPANOSTOMY TUBE PLACEMENT Bilateral    x3  per pt last one 1982   VULVA /PERINEUM BIOPSY N/A 05/12/2022   Procedure: POSSIBLE VULVAR BIOPSY; POSSIBLE VAGINAL BIOPSY;  Surgeon: Viktoria Comer SAUNDERS, MD;  Location: [facility];  Service: Gynecology;  Laterality: N/A;   VULVECTOMY N/A 05/12/2022   Procedure: WIDE EXCISION VULVECTOMY;  Surgeon: Viktoria Comer SAUNDERS, MD;  Location: [facility];  Service: Gynecology;  Laterality: N/A;   VULVECTOMY N/A 08/12/2022   Procedure: WIDE LOCAL EXCISION VULVECTOMY;  Surgeon: Viktoria Comer SAUNDERS, MD;  Location: [facility];  Service: Gynecology;  Laterality: N/A;   Social History   Socioeconomic History   Marital status: Single    Spouse name: Not on file   Number of children: Not on file   Years of education: Not on file   Highest education level: Not on file  Occupational History   Not on file  Tobacco Use   Smoking status: Every Day    Current packs/day: 0.25    Average packs/day: 0.3 packs/day for 20.0 years (5.0 ttl pk-yrs)    Types: Cigarettes   Smokeless tobacco: Never  Vaping Use   Vaping status: Never Used  Substance and Sexual Activity   Alcohol use: No    Comment: in remission since 2012   Drug use: Yes    Types: Marijuana     Comment: 08-11-22 last, per pt smokes on average 1/4oz per week   Sexual activity: Yes    Partners: Male    Birth control/protection: Surgical    Comment: Hysterectomy  Other Topics Concern   Not on file  Social History Narrative   Not on file   Social Drivers of Health   Financial Resource Strain: Low Risk  (12/10/2018)   Overall Financial Resource Strain (CARDIA)    Difficulty of Paying Living Expenses: Not hard at all  Food Insecurity: No Food Insecurity (07/07/2023)   Hunger Vital Sign    Worried About Running Out of Food in the Last Year: Never true    Ran Out of Food in the  Last Year: Never true  Transportation Needs: No Transportation Needs (07/07/2023)   PRAPARE - Administrator, Civil Service (Medical): No    Lack of Transportation (Non-Medical): No  Physical Activity: Unknown (12/10/2018)   Exercise Vital Sign    Days of Exercise per Week: Patient declined    Minutes of Exercise per Session: Patient declined  Stress: Not on file  Social Connections: Unknown (12/10/2018)   Social Connection and Isolation Panel    Frequency of Communication with Friends and Family: Patient declined    Frequency of Social Gatherings with Friends and Family: Patient declined    Attends Religious Services: Patient declined    Database administrator or Organizations: Patient declined    Attends Engineer, structural: Patient declined    Marital Status: Patient declined   Allergies  Allergen Reactions   Cyanoacrylate    Demerol  Nausea And Vomiting   Ibuprofen Other (See Comments)    Gastritis    Wound Dressing Adhesive Rash    Dermabond allergy    Family History  Problem Relation Age of Onset   Hyperlipidemia Mother    Hypertension Mother    Thyroid  disease Mother    Other Mother        Pituitary tumor   Hyperlipidemia Father    Hypertension Father    Migraines Father    Skin cancer Father    Hypertension Brother    Hyperlipidemia Brother    Heart attack  Paternal Uncle    Thyroid  disease Maternal Grandmother    Uterine cancer Maternal Grandmother 47   Dementia Maternal Grandmother    Thyroid  disease Maternal Grandfather    Prostate cancer Maternal Grandfather 48   Dementia Paternal Grandmother    Heart attack Paternal Grandfather    Colon cancer Paternal Great-grandmother      Current Outpatient Medications (Cardiovascular):    indapamide  (LOZOL ) 1.25 MG tablet, TAKE 1 TABLET BY MOUTH DAILY   metoprolol  tartrate (LOPRESSOR ) 25 MG tablet, Take 0.5 tablets (12.5 mg total) by mouth daily. Please keep scheduled appointment for future refills. Thank you.   olmesartan  (BENICAR ) 20 MG tablet, TAKE 1 TABLET BY MOUTH DAILY   rosuvastatin  (CRESTOR ) 20 MG tablet, TAKE 1 TABLET BY MOUTH DAILY  Current Outpatient Medications (Respiratory):    albuterol  (VENTOLIN  HFA) 108 (90 Base) MCG/ACT inhaler, INHALE TWO PUFFS BY MOUTH EVERY 6 HOURS AS NEEDED FOR WHEEZING OR SHORTNESS OF BREATH   Albuterol -Budesonide  (AIRSUPRA ) 90-80 MCG/ACT AERO, Inhale 2 Inhalations into the lungs every 4 (four) hours as needed.   benzonatate  (TESSALON ) 100 MG capsule, Take 1 capsule (100 mg total) by mouth every 8 (eight) hours.   HYDROcodone  bit-homatropine (HYCODAN) 5-1.5 MG/5ML syrup, Take 5 mLs by mouth every 8 (eight) hours as needed for cough. (Patient not taking: Reported on 05/29/2024)  Current Outpatient Medications (Analgesics):    aspirin  EC 81 MG tablet, Take 81 mg by mouth daily. Swallow whole.   oxyCODONE  (OXY IR/ROXICODONE ) 5 MG immediate release tablet, Take 1 tablet (5 mg total) by mouth every 6 (six) hours as needed for up to 8 doses.   oxyCODONE  (OXY IR/ROXICODONE ) 5 MG immediate release tablet, Take 1 tablet (5 mg total) by mouth every 6 (six) hours as needed for up to 8 doses.   oxyCODONE  (OXY IR/ROXICODONE ) 5 MG immediate release tablet, Take 1 tablet (5 mg total) by mouth every 6 (six) hours as needed for up to 10 doses.   oxyCODONE  (OXY IR/ROXICODONE ) 5  MG immediate release  tablet, Take 1 tablet (5 mg total) by mouth every 6 (six) hours as needed for up to 8 doses.   traMADol  (ULTRAM ) 50 MG tablet, Take 2 tablets (100 mg total) by mouth every 12 (twelve) hours as needed.  Current Outpatient Medications (Hematological):    warfarin (COUMADIN ) 7.5 MG tablet, TAKE ONE TO ONE AND ONE-HALF (1 TO 1.5) TABLETS BY MOUTH DAILY AS DIRECTED BY ANTICOAGULATION CLINIC  Current Outpatient Medications (Other):    DULoxetine  (CYMBALTA ) 20 MG capsule, Take 2 capsules (40 mg total) by mouth daily.   gabapentin  (NEURONTIN ) 300 MG capsule, TAKE TWO CAPSULES BY MOUTH THREE TIMES A DAY   lamoTRIgine  (LAMICTAL ) 200 MG tablet, TAKE 2 TABLETS BY MOUTH DAILY   meclizine  (ANTIVERT ) 12.5 MG tablet, TAKE ONE TABLET BY MOUTH TWICE A DAY AS NEEDED FOR DIZZINESS   tiZANidine  (ZANAFLEX ) 4 MG tablet, Take 1 tablet (4 mg total) by mouth at bedtime.   Reviewed prior external information including notes and imaging from  primary care provider As well as notes that were available from care everywhere and other healthcare systems.  Past medical history, social, surgical and family history all reviewed in electronic medical record.  No pertanent information unless stated regarding to the chief complaint.   Review of Systems:  No headache, visual changes, nausea, vomiting, diarrhea, constipation, dizziness, abdominal pain, skin rash, fevers, chills, night sweats, weight loss, swollen lymph nodes, body aches, joint swelling, chest pain, shortness of breath, mood changes. POSITIVE muscle aches  Objective  There were no vitals taken for this visit.   General: No apparent distress alert and oriented x3 mood and affect normal, dressed appropriately.  HEENT: Pupils equal, extraocular movements intact  Respiratory: Patient's speak in full sentences and does not appear short of breath  Cardiovascular: No lower extremity edema, non tender, no erythema  Calf exam shows   Limited  muscular skeletal ultrasound was performed and interpreted by CLAUDENE HUSSAR, M     Impression and Recommendations:     The above documentation has been reviewed and is accurate and complete Deveion Denz M Corretta Munce, DO

## 2024-08-24 ENCOUNTER — Ambulatory Visit: Admitting: Family Medicine

## 2024-08-28 ENCOUNTER — Encounter: Payer: Self-pay | Admitting: Dermatology

## 2024-08-28 ENCOUNTER — Ambulatory Visit: Admitting: Dermatology

## 2024-08-28 VITALS — BP 179/89 | HR 78

## 2024-08-28 DIAGNOSIS — Z85828 Personal history of other malignant neoplasm of skin: Secondary | ICD-10-CM

## 2024-08-28 DIAGNOSIS — C4491 Basal cell carcinoma of skin, unspecified: Secondary | ICD-10-CM

## 2024-08-28 DIAGNOSIS — L905 Scar conditions and fibrosis of skin: Secondary | ICD-10-CM

## 2024-08-28 DIAGNOSIS — L720 Epidermal cyst: Secondary | ICD-10-CM

## 2024-08-28 NOTE — Progress Notes (Signed)
   Follow Up Visit   Subjective  Danielle Obrien is a 43 y.o. female who presents for Danielle following: follow up from Mohs surgery   Danielle patient presents for follow up from Mohs surgery for a BCC on Danielle right cutaneous lip, treated on 07/04/24, repaired with Advancement flap. Danielle patient has been bandaging Danielle wound as directed. Danielle endorse Danielle following concerns: still a bit sore. Accompanied by her sign language interpreter  She has a lesion of concern on her mid chest, present for several months. Not previously treated.  Danielle following portions of Danielle chart were reviewed this encounter and updated as appropriate: medications, allergies, medical history  Review of Systems:  No other skin or systemic complaints except as noted in HPI or Assessment and Plan.  Objective  Well appearing patient in no apparent distress; mood and affect are within normal limits.  A focal examination was performed including scalp, head, and face. All findings within normal limits unless otherwise noted below.  Healing wound with mild erythema  Relevant physical exam findings are noted in Danielle Assessment and Plan.    Assessment & Plan   Scar s/p Mohs for Danielle Obrien, treated on 07/04/24, repaired with advancement flap. - Reassured that wound is healing well - No evidence of infection - No swelling, induration, purulence, dehiscence, or tenderness out of proportion to Danielle clinical exam, see photo above - Discussed that scars take up to 12 months to mature from Danielle date of surgery - Recommend SPF 30+ to scar daily to prevent purple color from UV exposure during scar maturation process - Discussed that erythema and raised appearance of scar will fade over Danielle next 4-6 months - OK to start scar massage at 4-6 weeks post-op - Can consider silicone based products for scar healing starting at 6 weeks post-op  HISTORY OF BASAL CELL CARCINOMA OF Danielle SKIN - No evidence of recurrence today - Recommend regular full body skin  exams - Recommend daily broad spectrum sunscreen SPF 30+ to sun-exposed areas, reapply every 2 hours as needed.  - Call if any new or changing lesions are noted between office visits   EPIDERMAL INCLUSION CYST Exam: Subcutaneous nodule at medial chest  Benign-appearing. Exam most consistent with an epidermal inclusion cyst. Discussed that a cyst is a benign growth that can grow over time and sometimes get irritated or inflamed. Recommend observation if it is not bothersome. Discussed option of surgical excision to remove it if it is growing, symptomatic, or other changes noted. Please call for new or changing lesions so they can be evaluated.    Return in about 3 months (around 11/27/2024) for scar check with Danielle Obrien & 6mn TBSC with Danielle Obrien hx BCC.  I, Berwyn Lesches, Surg Tech III, am acting as scribe for RUFUS CHRISTELLA HOLY, MD.   Documentation: I have reviewed Danielle above documentation for accuracy and completeness, and I agree with Danielle above.  RUFUS CHRISTELLA HOLY, MD

## 2024-08-28 NOTE — Patient Instructions (Addendum)

## 2024-08-29 ENCOUNTER — Other Ambulatory Visit: Payer: Self-pay | Admitting: Internal Medicine

## 2024-08-29 DIAGNOSIS — I10 Essential (primary) hypertension: Secondary | ICD-10-CM

## 2024-08-29 DIAGNOSIS — F3177 Bipolar disorder, in partial remission, most recent episode mixed: Secondary | ICD-10-CM

## 2024-09-01 ENCOUNTER — Other Ambulatory Visit: Payer: Self-pay | Admitting: Family Medicine

## 2024-09-01 ENCOUNTER — Telehealth: Payer: Self-pay

## 2024-09-01 ENCOUNTER — Other Ambulatory Visit: Payer: Self-pay

## 2024-09-01 MED ORDER — TIZANIDINE HCL 4 MG PO TABS: 4.0000 mg | ORAL_TABLET | Freq: Every day | ORAL | 0 refills | Status: DC

## 2024-09-01 MED ORDER — GABAPENTIN 300 MG PO CAPS: 600.0000 mg | ORAL_CAPSULE | Freq: Three times a day (TID) | ORAL | 0 refills | Status: DC

## 2024-09-01 NOTE — Telephone Encounter (Signed)
 Yes, ok for her to come on a Monday and see Melissa

## 2024-09-01 NOTE — Telephone Encounter (Signed)
 LVM for Danielle Obrien to call office regarding pre-meds before appointment with Melissa cross NP on Monday 10/13 @ 1:00.

## 2024-09-01 NOTE — Telephone Encounter (Signed)
 Danielle Obrien states she can not come in on any day except Mondays/Thursdays. Today is the first day back from surgery. Please advise if Monday 10/13 is ok with [employer].

## 2024-09-01 NOTE — Telephone Encounter (Signed)
 Ms.Chism called requesting an appointment with Dr.Tucker or Eleanor Epps NP.   Pt reports earlier this week she noticed a pinpoint spot on the top of her vagina on the outside. She states she initially thought it was the start of a pimple or ingrown hair and was watching it. She states it has gotten bigger as the week has progressed. Reports it started out red and is getting black, like a scab color. It is now the size of the tip of a magic marker and feels like a root is forming. Pt states this is a previous surgery (biopsy) site in which Dr.Tucker had to do a deep cut. Reports no pain, it is not warm to the touch. She does not have a fever/chills.   Pt states she can come in on Monday or Thursday next week.

## 2024-09-04 ENCOUNTER — Other Ambulatory Visit: Payer: Self-pay | Admitting: Gynecologic Oncology

## 2024-09-04 DIAGNOSIS — F418 Other specified anxiety disorders: Secondary | ICD-10-CM

## 2024-09-04 MED ORDER — LORAZEPAM 0.5 MG PO TABS: 0.5000 mg | ORAL_TABLET | Freq: Once | ORAL | 0 refills | Status: AC

## 2024-09-04 NOTE — Telephone Encounter (Signed)
 Danielle Obrien would like Lorazepam  for upcoming appointment sent to pharmacy on file

## 2024-09-04 NOTE — Telephone Encounter (Signed)
 LVM for Danielle Obrien to call office regarding pre-meds before upcoming appointment on 10/13

## 2024-09-11 ENCOUNTER — Inpatient Hospital Stay: Attending: Gynecologic Oncology | Admitting: Gynecologic Oncology

## 2024-09-11 ENCOUNTER — Other Ambulatory Visit: Payer: Self-pay | Admitting: Family Medicine

## 2024-09-11 VITALS — BP 145/89 | HR 66 | Temp 98.6°F | Resp 18 | Wt 114.0 lb

## 2024-09-11 DIAGNOSIS — N900 Mild vulvar dysplasia: Secondary | ICD-10-CM | POA: Diagnosis not present

## 2024-09-11 DIAGNOSIS — C4491 Basal cell carcinoma of skin, unspecified: Secondary | ICD-10-CM | POA: Insufficient documentation

## 2024-09-11 DIAGNOSIS — D071 Carcinoma in situ of vulva: Secondary | ICD-10-CM

## 2024-09-11 DIAGNOSIS — N9089 Other specified noninflammatory disorders of vulva and perineum: Secondary | ICD-10-CM | POA: Diagnosis present

## 2024-09-11 DIAGNOSIS — Z86002 Personal history of in-situ neoplasm of other and unspecified genital organs: Secondary | ICD-10-CM | POA: Insufficient documentation

## 2024-09-11 NOTE — Progress Notes (Signed)
 Gynecologic Oncology Return Clinic Visit  09/11/2024  Reason for Visit: follow-up  Treatment History: Dysplasia history:  03/19/22: Vaginal cuff biopsy - VAIN1; left buttocks biopsy - VIN3, Perineal biopsy - VIN 2-3, mons biopsy - VIN 2-3. 01/2022 Vaginal pap - negative, HR HPV positive 12/2018: Pap - negative, HR HPV positive (negative for 16, 18 and 45) 05/2017: vaginal biopsies - one with benign squamous mucosa, other with VAIN I 04/2017: Vaginal pap - negative, HR HPV not detected. 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 Vaginal biopsies, one with VAIN 2, other VAIN 1. 10/2015: vaginal cuff biopsy: VAIN II.  Vulvar biopsies performed including mons and right labia, both showing condyloma. 08/2015: Vaginal pap - LSIL, HR HPV positive. 02/2014: Vaginal cuff biopsy showed condylomata acuminata, p16 negative for diffuse positive staining.  No malignancy or high-grade dysplasia.  Right labial biopsy shows condyloma. 01/2014: Vaginal pap - LSIL.  Reactive squamous cells present. HR HPV positive. 01/2009: Pathology from hysterectomy shows chronic mild inflammation of the cervix and endocervix, mesonephric hyperplasia.  No evidence of malignancy.  Secretory type endometrium. 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  Endo and ectocervical margins negative for mucosa but partially denuded.  Stromal invasion noted. 10/2008: Cervical biopsy at 6:00 shows HPV effect consistent with low-grade dysplasia.  ECC with 1 fragment of ectocervical/squamous metaplasia with low-grade intraepithelial lesion and focal changes consistent with CIN-2. 07/2020: ECC shows benign endocervix, no dysplasia. 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  Cervical biopsy at 1:00 showed squamous mucosa with koilocytic atypia, no dysplasia identified.  ECC shows rare detached fragments of slightly  dysplastic squamous mucosa with benign endocervical mucosa.   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   07/23/22: Vulvar biopsy given symptoms at prior healing incision shows VIN3.    08/12/22: Partial simple anterior left vulvectomy. Pathology: VIN3, margins negative. Right mons biopsy and posterior fourchette biopsy negative for dysplasia or malignancy.   01/07/23: Left peri-anal biopsy shows reactive squamous mucosa, no dysplasia.   08/26/23: Pap - ASCUS, HR HPV + (16/18/45 negative)  10/2023: Colposcopy - vaginal cuff biopsy with VAIN1  Last seen in the office by Dr. Viktoria on 04/27/2024 with plan for follow up in six months.  Interval History: She presents to the office today for evaluation of new vulvar lesions. She felt the area on the mons develop several months ago. A scab-like area developed after a period of time and it continued to grow in size. She has also noted a raised area on the left vulva near the introitus. No vulvar pain, itching, discharge, or bleeding reported.   She had facial surgery on 05/23/2024 that returned with basal cell carcinoma. Subsequently, she underwent Mohs surgery on 07/04/2024 repaired with an advancement flap. Appetite has been affected by recent facial surgery but is improving. She is overall recovering and has recently went back to work. No fever or chills. Bladder functioning without difficulty. Bowels moving. Has interpreter present.    Past Medical/Surgical History: Past Medical History:  Diagnosis Date   ADD (attention deficit disorder)    Alcohol abuse, in remission 2012   per pt in remission since 2012   Anticoagulated on Coumadin     coumadin --- managed by cardiology/ coumadin  clinic   Arthritis    In hips   Asthma    allergy induced, rare inhaler  use   Auditory processing disorder    per pt very HOH, first language ASL   Basal cell carcinoma    Bipolar disorder (HCC)    Chronic left hip pain     Chronic vertigo    Complication of anesthesia    per pt due to auditory processing disorder pt is unable to hear when waking up and also gets very anxious   Family history of uterine cancer    GAD (generalized anxiety disorder)    GERD (gastroesophageal reflux disease)    History of cervical dysplasia    History of condyloma acuminatum 2017   s/p laser ablation vulva   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   biopsy and CO2 laser ablation   HOH (hard of hearing)    per pt very HOH due to auditroy processing disorder   Hyperlipidemia    Hypertension    Muscle spasms of both lower extremities    both hips   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   23 mm Sorin [employer] [place] bileaflet mechanical valve via right anterior mini thoracotomy   Vulvar dysplasia     Past Surgical History:  Procedure Laterality Date   ANTERIOR CRUCIATE LIGAMENT REPAIR  1993   AORTIC VALVE REPLACEMENT N/A 11/03/2017   Procedure: MINIMALLY INVASIVE AORTIC VALVE REPLACEMENT( MINI THORACOTOMY);  Surgeon: Dusty Sudie DEL, MD;  Location: [facility] OR;  Service: Open Heart Surgery;  Laterality: N/A;   CERVICAL BIOPSY  W/ LOOP ELECTRODE EXCISION  2010   CIN III w/extension to glands   CHOLECYSTECTOMY N/A 07/11/2023   Procedure: LAPAROSCOPIC CHOLECYSTECTOMY WITH POSSIBLE INTRAOPERATIVE CHOLANGIOGRAM;  Surgeon: Signe Mitzie LABOR, MD;  Location: [facility] OR;  Service: General;  Laterality: N/A;   CLOSED REDUCTION HIP DISLOCATION Left 1981   CO2 LASER APPLICATION N/A 05/12/2022   Procedure: CO2 LASER APPLICATION TO VULVA;  Surgeon: Viktoria Comer SAUNDERS, MD;  Location[facility];  Service: Gynecology;  Laterality: N/A;   COLPOSCOPY  10/30/2008   CIN I & II   COLPOSCOPY  07/31/2000   Neg. ECC   COLPOSCOPY  06/30/2001   CIN I   COLPOSCOPY  08/30/2004   ECC--atypia   COLPOSCOPY N/A 01/21/2016   Procedure: COLPOSCOPY with vaginal biopsy with CO 2 Laser  of Vaginal and vulvar condyloma;  Surgeon: Bobie FORBES Cathlyn JAYSON Nikki, MD;  Location: [facility] ORS;  Service: Gynecology;  Laterality: N/A;  [name] will be here 2/21 for 1115 case confirmed 01/16/15 - TS   HARDWARE REMOVAL Left 1992   Left hip   INGUINAL HERNIA REPAIR Bilateral    1981;  [id number]   LEFT AND RIGHT HEART CATHETERIZATION WITH CORONARY ANGIOGRAM N/A 05/18/2014   Procedure: LEFT AND RIGHT HEART CATHETERIZATION WITH CORONARY ANGIOGRAM;  Surgeon: Candyce GORMAN Reek, MD;  Location: [facility] CATH LAB;  Service: Cardiovascular;  Laterality: N/A;   LESION EXCISION WITH COMPLEX REPAIR N/A 05/23/2024   Procedure: LESION EXCISION WITH COMPLEX REPAIR;  Surgeon: Waddell Leonce NOVAK, MD;  Location: [facility];  Service: Plastics;  Laterality: N/A;  Excision of facial lesion, right upper lip   LESION REMOVAL Right 05/12/2022   Procedure: ANTONETTE OF RIGHT CHEEK CYST;  Surgeon: Elisabeth Craig GORMAN, MD;  [facility];  Service: Plastics;  Laterality: Right;   LYMPH NODE BIOPSY  1995   NASAL SEPTUM SURGERY  2002   ORIF HIP FRACTURE Left 1990   per pt and  took out growth plate in  Right knee   ROBOTIC ASSISTED TOTAL HYSTERECTOMY  02/26/2009   @[facility]    TEE WITHOUT CARDIOVERSION N/A 11/03/2017   Procedure: TRANSESOPHAGEAL ECHOCARDIOGRAM (TEE);  Surgeon: Dusty Sudie DEL, MD;  Location: [facility] OR;  Service: Open Heart Surgery;  Laterality: N/A;   TONSILLECTOMY AND ADENOIDECTOMY  1990   TYMPANOSTOMY TUBE PLACEMENT Bilateral    x3  per pt last one 1982   VULVA /PERINEUM BIOPSY N/A 05/12/2022   Procedure: POSSIBLE VULVAR BIOPSY; POSSIBLE VAGINAL BIOPSY;  Surgeon: Viktoria Comer SAUNDERS, MD;  Location: [facility];  Service: Gynecology;  Laterality: N/A;   VULVECTOMY N/A 05/12/2022   Procedure: WIDE EXCISION VULVECTOMY;  Surgeon: Viktoria Comer SAUNDERS, MD;  Location: [facility];  Service: Gynecology;  Laterality: N/A;   VULVECTOMY N/A 08/12/2022   Procedure: WIDE LOCAL  EXCISION VULVECTOMY;  Surgeon: Viktoria Comer SAUNDERS, MD;  Location: [facility];  Service: Gynecology;  Laterality: N/A;    Family History  Problem Relation Age of Onset   Hyperlipidemia Mother    Hypertension Mother    Thyroid  disease Mother    Other Mother        Pituitary tumor   Hyperlipidemia Father    Hypertension Father    Migraines Father    Skin cancer Father    Hypertension Brother    Hyperlipidemia Brother    Heart attack Paternal Uncle    Thyroid  disease Maternal Grandmother    Uterine cancer Maternal Grandmother 64   Dementia Maternal Grandmother    Thyroid  disease Maternal Grandfather    Prostate cancer Maternal Grandfather 65   Dementia Paternal Grandmother    Heart attack Paternal Grandfather    Colon cancer Paternal Great-grandmother     Social History   Socioeconomic History   Marital status: Single    Spouse name: Not on file   Number of children: Not on file   Years of education: Not on file   Highest education level: Not on file  Occupational History   Not on file  Tobacco Use   Smoking status: Every Day    Current packs/day: 0.25    Average packs/day: 0.3 packs/day for 20.0 years (5.0 ttl pk-yrs)    Types: Cigarettes   Smokeless tobacco: Never  Vaping Use   Vaping status: Never Used  Substance and Sexual Activity   Alcohol use: No    Comment: in remission since 2012   Drug use: Yes    Types: Marijuana    Comment: 08-11-22 last, per pt smokes on average 1/4oz per week   Sexual activity: Yes    Partners: Male    Birth control/protection: Surgical    Comment: Hysterectomy  Other Topics Concern   Not on file  Social History Narrative   Not on file   Social Drivers of Health   Financial Resource Strain: Low Risk  (12/10/2018)   Overall Financial Resource Strain (CARDIA)    Difficulty of Paying Living Expenses: Not hard at all  Food Insecurity: No Food Insecurity (07/07/2023)   Hunger Vital Sign    Worried About Running Out  of Food in the Last Year: Never true    Ran Out of Food in the Last Year: Never true  Transportation Needs: No Transportation Needs (07/07/2023)   PRAPARE - Administrator, Civil Service (Medical): No    Lack of Transportation (Non-Medical): No  Physical Activity: Unknown (12/10/2018)   Exercise Vital Sign    Days of Exercise per Week: Patient declined  Minutes of Exercise per Session: Patient declined  Stress: Not on file  Social Connections: Unknown (12/10/2018)   Social Connection and Isolation Panel    Frequency of Communication with Friends and Family: Patient declined    Frequency of Social Gatherings with Friends and Family: Patient declined    Attends Religious Services: Patient declined    Database administrator or Organizations: Patient declined    Attends Banker Meetings: Patient declined    Marital Status: Patient declined    Current Medications:  Current Outpatient Medications:    benzonatate  (TESSALON ) 100 MG capsule, Take 1 capsule (100 mg total) by mouth every 8 (eight) hours. (Patient taking differently: Take 100 mg by mouth 3 (three) times daily as needed for cough.), Disp: 21 capsule, Rfl: 0   albuterol  (VENTOLIN  HFA) 108 (90 Base) MCG/ACT inhaler, INHALE TWO PUFFS BY MOUTH EVERY 6 HOURS AS NEEDED FOR WHEEZING OR SHORTNESS OF BREATH, Disp: 8.5 g, Rfl: 1   Albuterol -Budesonide  (AIRSUPRA ) 90-80 MCG/ACT AERO, Inhale 2 Inhalations into the lungs every 4 (four) hours as needed., Disp: 30 g, Rfl: 5   aspirin  EC 81 MG tablet, Take 81 mg by mouth daily. Swallow whole., Disp: , Rfl:    DULoxetine  (CYMBALTA ) 20 MG capsule, TAKE 2 CAPSULES BY MOUTH DAILY, Disp: 180 capsule, Rfl: 0   gabapentin  (NEURONTIN ) 300 MG capsule, Take 2 capsules (600 mg total) by mouth 3 (three) times daily., Disp: 540 capsule, Rfl: 0   HYDROcodone  bit-homatropine (HYCODAN) 5-1.5 MG/5ML syrup, Take 5 mLs by mouth every 8 (eight) hours as needed for cough. (Patient not taking:  Reported on 05/29/2024), Disp: 240 mL, Rfl: 0   indapamide  (LOZOL ) 1.25 MG tablet, TAKE 1 TABLET BY MOUTH DAILY, Disp: 90 tablet, Rfl: 0   lamoTRIgine  (LAMICTAL ) 200 MG tablet, TAKE 2 TABLETS BY MOUTH DAILY, Disp: 60 tablet, Rfl: 0   meclizine  (ANTIVERT ) 12.5 MG tablet, TAKE ONE TABLET BY MOUTH TWICE A DAY AS NEEDED FOR DIZZINESS, Disp: 20 tablet, Rfl: 0   metoprolol  tartrate (LOPRESSOR ) 25 MG tablet, Take 0.5 tablets (12.5 mg total) by mouth daily. Please keep scheduled appointment for future refills. Thank you., Disp: 90 tablet, Rfl: 3   olmesartan  (BENICAR ) 20 MG tablet, TAKE 1 TABLET BY MOUTH DAILY, Disp: 30 tablet, Rfl: 0   oxyCODONE  (OXY IR/ROXICODONE ) 5 MG immediate release tablet, Take 1 tablet (5 mg total) by mouth every 6 (six) hours as needed for up to 8 doses. (Patient not taking: Reported on 09/07/2024), Disp: 8 tablet, Rfl: 0   oxyCODONE  (OXY IR/ROXICODONE ) 5 MG immediate release tablet, Take 1 tablet (5 mg total) by mouth every 6 (six) hours as needed for up to 8 doses. (Patient not taking: Reported on 09/07/2024), Disp: 8 tablet, Rfl: 0   oxyCODONE  (OXY IR/ROXICODONE ) 5 MG immediate release tablet, Take 1 tablet (5 mg total) by mouth every 6 (six) hours as needed for up to 10 doses. (Patient not taking: Reported on 09/07/2024), Disp: 8 tablet, Rfl: 0   oxyCODONE  (OXY IR/ROXICODONE ) 5 MG immediate release tablet, Take 1 tablet (5 mg total) by mouth every 6 (six) hours as needed for up to 8 doses. (Patient not taking: Reported on 09/07/2024), Disp: 8 tablet, Rfl: 0   rosuvastatin  (CRESTOR ) 20 MG tablet, TAKE 1 TABLET BY MOUTH DAILY, Disp: 30 tablet, Rfl: 0   tiZANidine  (ZANAFLEX ) 4 MG tablet, Take 1 tablet (4 mg total) by mouth at bedtime., Disp: 30 tablet, Rfl: 0   traMADol  (ULTRAM ) 50 MG tablet,  Take 2 tablets (100 mg total) by mouth every 12 (twelve) hours as needed., Disp: 120 tablet, Rfl: 0   warfarin (COUMADIN ) 7.5 MG tablet, TAKE ONE TO ONE AND ONE-HALF (1 TO 1.5) TABLETS BY MOUTH DAILY  AS DIRECTED BY ANTICOAGULATION CLINIC, Disp: 40 tablet, Rfl: 2  Review of Systems: On ROS intake form, + for appetite changes related to recent surgery, hot flashes Denies appetite changes, fevers, chills, fatigue, unexplained weight changes. Denies neck lumps or masses, mouth sores, ringing in ears or voice changes. Denies cough or wheezing.  Denies shortness of breath. Denies chest pain or palpitations. Denies leg swelling. Denies abdominal distention, pain, blood in stools, constipation, diarrhea, nausea, vomiting, or early satiety. Denies pain with intercourse, dysuria, frequency, hematuria or incontinence. Denies hot flashes, pelvic pain, vaginal bleeding or vaginal discharge.   Denies joint pain, back pain or muscle pain/cramps. Denies itching, rash, or wounds. Denies dizziness, headaches, numbness or seizures. Denies swollen lymph nodes or glands, denies easy bruising or bleeding. Denies anxiety, depression, confusion, or decreased concentration.  Physical Exam: BP (!) 145/89 (BP Location: Left Arm, Patient Position: Sitting)   Pulse 66   Temp 98.6 F (37 C) (Oral)   Resp 18   Wt 114 lb (51.7 kg)   SpO2 98%   BMI 22.64 kg/m  General: Alert, oriented, no acute distress. HEENT: Normocephalic, atraumatic, sclera anicteric. Chest: Unlabored breathing on room air. Lungs clear. Heart regular in rate and rhythm. GU: Hypopigmentation of a 4 x 5 cm area along the right mons, small raised and red scab area within his hypopigmented region.  Small raised area on the left vulva, no leukoplakia and no acetowhite after application of 5% acetic acid .  The skin along the perineum is somewhat thinned but otherwise normal in appearance.  Several hypopigmented lesions consistent with prior laser therapy.    Vulvar/peri-anal biopsy procedure Preoperative diagnosis: history of VIN3, new mons and vulvar lesions Postoperative diagnosis: Same as above Provider: Eleanor Epps NP Estimated blood  loss: Minimal Specimens: Mons and vulvar biopsy Procedure: After the procedure was discussed with the patient including risks and benefits, she gave verbal consent.  She was then placed in dorsolithotomy position the areas to be biopsy was examined with findings as above.  The areas were cleansed with Betadine x 3.  0.5 cc of 1% lidocaine  was then injected into each area.  After adequate time for anesthesia was given, pickups were used to remove the scab area from the mons and Tischler forceps were used to biopsy the left vulva.  Both specimens were placed in formalin. Silver  nitrate and pressure were used to achieve hemostasis.  Overall the patient tolerated the procedure well.  Laboratory & Radiologic Studies: None new  Assessment & Plan: Danielle Obrien is a 43 y.o. 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  Developed symptoms at the site of her wide local excision with biopsy proven VIN3 resected in 07/2022 with negative margins.  Recent Pap ASCUS, high risk HPV positive. Colposcopy with VAIN1 on biopsy  On exam, 2 areas with low suspicion for high-grade dysplasia.  Given patient's concern and change in her exam, both areas were biopsied.  I will plan to contact her with these biopsies.  If low-grade dysplasia or normal, we will continue with close surveillance.  If high-grade dysplasia, will discuss with Dr. Viktoria for treatment.  20 minutes of total time was spent for this patient encounter, including preparation, face-to-face  counseling with the patient and coordination of care, and documentation of the encounter.  Eleanor Epps, NP

## 2024-09-11 NOTE — Patient Instructions (Addendum)
 Today we took a biopsy from the scabbed area on your mons. After removal of the area, there appeared to be a small hair underneath. We also took a biopsy from the left vulva close to the entrance of the vagina. We will contact you with the results.  We would recommend rinsing the biopsy area on your vulva and patting dry for the next several days. Please call for any bleeding or drainage from the areas.  Would continue with follow up plan with Dr. Viktoria in December 2025 or sooner if needed.  Please call for any new symptoms such as soreness, itching.

## 2024-09-14 ENCOUNTER — Ambulatory Visit: Attending: Cardiology

## 2024-09-14 ENCOUNTER — Ambulatory Visit: Payer: Self-pay | Admitting: *Deleted

## 2024-09-14 DIAGNOSIS — Q2381 Bicuspid aortic valve: Secondary | ICD-10-CM | POA: Diagnosis not present

## 2024-09-14 DIAGNOSIS — Z954 Presence of other heart-valve replacement: Secondary | ICD-10-CM | POA: Diagnosis not present

## 2024-09-14 DIAGNOSIS — Z7901 Long term (current) use of anticoagulants: Secondary | ICD-10-CM | POA: Diagnosis not present

## 2024-09-14 DIAGNOSIS — I350 Nonrheumatic aortic (valve) stenosis: Secondary | ICD-10-CM

## 2024-09-14 LAB — SURGICAL PATHOLOGY

## 2024-09-14 LAB — POCT INR: INR: 4.1 — AB (ref 2.0–3.0)

## 2024-09-14 NOTE — Telephone Encounter (Signed)
-----   Message from Eleanor JONETTA Epps sent at 09/14/2024  1:00 PM EDT ----- Please call and let her know her biopsy results:  1) The area on the right mons came back as an inflammatory crust/scab.   2) The left vulvar biopsy came back with mild dysplasia (VIN 1) which we will continue to monitor.   Would recommend keeping appt in December and calling sooner for any issues. ----- Message ----- From: Interface, Lab In Three Zero One Sent: 09/14/2024  11:37 AM EDT To: Eleanor JONETTA Epps, NP

## 2024-09-14 NOTE — Progress Notes (Signed)
 INR 4.1 Please see anticoagulation encounter Hold tomorrow only then Continue taking warfarin 1.5 tablets daily except for 1 tablet on Mondays, Wednesdays, and Fridays. Recheck INR in 4 weeks. Coumadin  Clinic [phone]

## 2024-09-14 NOTE — Patient Instructions (Signed)
 Hold tomorrow only then Continue taking warfarin 1.5 tablets daily except for 1 tablet on Mondays, Wednesdays, and Fridays. Recheck INR in 4 weeks. Coumadin  Clinic [phone]

## 2024-09-14 NOTE — Telephone Encounter (Signed)
 Spoke with Ms. Semel and relayed results of recent biopsy from Eleanor Epps, NP.  The area on the right mons came back as an inflammatory crust/scab.  And the left vulva biopsy came back with mild dysplasia (VIN 1) which we will continue to monitor.  Would recommend keeping appt. In December and calling sooner for any issues. Pt verbalized understanding and thanked the office for calling.

## 2024-09-15 ENCOUNTER — Encounter: Payer: Self-pay | Admitting: Gynecologic Oncology

## 2024-09-21 ENCOUNTER — Telehealth: Payer: Self-pay | Admitting: Family Medicine

## 2024-09-21 NOTE — Telephone Encounter (Signed)
 Patient scheduled an appointment and requested for Dr. Claudene to send in a prescription for her pain medication. Please advise.

## 2024-09-22 ENCOUNTER — Other Ambulatory Visit: Payer: Self-pay | Admitting: Family Medicine

## 2024-10-02 ENCOUNTER — Other Ambulatory Visit: Payer: Self-pay | Admitting: Internal Medicine

## 2024-10-02 DIAGNOSIS — I10 Essential (primary) hypertension: Secondary | ICD-10-CM

## 2024-10-02 DIAGNOSIS — F3177 Bipolar disorder, in partial remission, most recent episode mixed: Secondary | ICD-10-CM

## 2024-10-12 ENCOUNTER — Ambulatory Visit: Attending: Cardiology | Admitting: *Deleted

## 2024-10-12 DIAGNOSIS — Z5181 Encounter for therapeutic drug level monitoring: Secondary | ICD-10-CM

## 2024-10-12 DIAGNOSIS — Z954 Presence of other heart-valve replacement: Secondary | ICD-10-CM

## 2024-10-12 DIAGNOSIS — Z7901 Long term (current) use of anticoagulants: Secondary | ICD-10-CM

## 2024-10-12 LAB — POCT INR: POC INR: 3.2

## 2024-10-12 NOTE — Progress Notes (Signed)
 Lab Results  Component Value Date   INR 3.2 10/12/2024   INR 4.1 (A) 09/14/2024   INR 2.3 08/21/2024    Description   INR 3.2; Start taking warfarin 1 tablet daily except for 1.5 tablets on Sunday, Tuesday and Thursday. Recheck INR in 3 weeks. Coumadin  Clinic [phone]

## 2024-10-12 NOTE — Patient Instructions (Signed)
 Description   Start taking warfarin 1 tablet daily except for 1.5 tablets on Sunday, Tuesday and Thursday. Recheck INR in 3 weeks. Coumadin  Clinic [phone]

## 2024-10-31 ENCOUNTER — Other Ambulatory Visit: Payer: Self-pay | Admitting: Internal Medicine

## 2024-10-31 ENCOUNTER — Other Ambulatory Visit: Payer: Self-pay | Admitting: Cardiology

## 2024-10-31 DIAGNOSIS — I10 Essential (primary) hypertension: Secondary | ICD-10-CM

## 2024-10-31 DIAGNOSIS — F3177 Bipolar disorder, in partial remission, most recent episode mixed: Secondary | ICD-10-CM

## 2024-11-01 ENCOUNTER — Other Ambulatory Visit: Payer: Self-pay | Admitting: Nurse Practitioner

## 2024-11-01 ENCOUNTER — Other Ambulatory Visit: Payer: Self-pay | Admitting: Family Medicine

## 2024-11-01 DIAGNOSIS — D649 Anemia, unspecified: Secondary | ICD-10-CM

## 2024-11-01 DIAGNOSIS — D696 Thrombocytopenia, unspecified: Secondary | ICD-10-CM

## 2024-11-01 NOTE — Progress Notes (Unsigned)
 Patient Care Team: Joshua Debby CROME, MD as PCP - General (Internal Medicine) Shlomo Wilbert SAUNDERS, MD as PCP - Cardiology (Cardiology) Norleen Lynwood ORN, MD as Consulting Physician (Internal Medicine) Cathlyn JAYSON Nikki Bobie FORBES, MD as Consulting Physician (Obstetrics and Gynecology) Merceda Lela SAUNDERS, [facility] (Pharmacist) Lanny Callander, MD as Consulting Physician (Hematology and Oncology) Hanford Powell FORBES, NP as Nurse Practitioner (Hematology and Oncology)  Clinic Day:  11/02/2024  Referring physician: Joshua Debby CROME, MD  ASSESSMENT & PLAN:   Assessment & Plan: Thrombocytopenia We discussed common etiology of erythrocytosis (PV and secondary polycythemia), and thrombocytopenia, such as ITP, chronic hepatitis, liver disease, splenomegaly, medication induced, B12 deficiency, folate deficiency, and primary pulmonary disease.  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   -lab check today includes immature platelet count, B12, folate, erythropoietin  level, and MPN panel.  -prior labs were negative for HIV and hepatitis C.  Will call her with lab results.   -She is on Coumadin , so risk of thrombosis from erythrocytosis is low.  We discussed the risks of bleeding from thrombocytopenia since she is on Coumadin .She is followed by cardiology for INR monitoring and management of coumadin .  -if her platelet count is persistently less than 50K, she may require treatment such as platelet transfusion in the future.   - 11/02/2024-CBC, iron  panel, ferritin, and B12 levels, all within normal limits.  May continue vitamin B12 supplement daily.  Will monitor labs every 4 months and follow-up in 1 year, sooner if needed.    Chest pain Patient reporting left-sided chest pain.  Sharp in nature.  She is associating this with cough which started after having skin cancer removal between 1 and 2 weeks ago.  EKG was done during today's visit.  EKG showing normal sinus rhythm with  possible left atrial enlargement.  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  Since most recent EKG done in January 2025, there is decreased ventricular rate, QRS axis shifted to the left, and shortened QT interval.  She does see cardiology on routine basis.  She is also monitored by Coumadin  clinic to manage warfarin levels.  Plan Labs reviewed. - CBC is unremarkable. - Iron  panel is within normal limits. - Ferritin and B12 levels both within normal limits. Reviewed EKG, showing overall, stable abnormalities.  She follows up with cardiology on regular basis. May continue to take vitamin B12 every day. Continue to check labs every 4 months.  Follow-up in 1 year, sooner if needed.  The patient understands the plans discussed today and is in agreement with them.  She knows to contact our office if she develops concerns prior to her next appointment.  I provided 30 minutes of face-to-face time during this encounter and > 50% was spent counseling as documented under my assessment and plan.    Powell FORBES Han[place], NP  [facility] - A DEPT [facility] [address] [place] [address] Dept: [phone] Dept Fax: [phone]   Orders Placed This Encounter  Procedures   EKG 12-Lead    Ordered by an unspecified provider       CHIEF COMPLAINT:  CC: Anemia; thrombocytopenia  Current Treatment: Observation  INTERVAL HISTORY:  Danielle Obrien is here today for repeat clinical assessment. She last saw Dr. Lanny 11/04/2023.  She states that recently she did have a skin cancer removed from the area just above the right side of her mouth.  This resulted  in significant sinus swelling and congestion.  She has since been coughing a lot.  Wheezing more often, especially when lying on her left side.  She has been having some sharp, left-sided chest pain when coughing.  States she is otherwise feeling well.  She denies chest pressure or or  palpitations.  She denies headaches or visual disturbance.  She denies abdominal pain, nausea, vomiting, diarrhea.  She denies unusual bleeding such as blood in her stool, hematemesis, hemoptysis, or unusual nosebleeds.  She denies fevers or chills. She denies pain. Her appetite is good. Her weight has been stable.  I have reviewed the past medical history, past surgical history, social history and family history with the patient and they are unchanged from previous note.  ALLERGIES:  is allergic to cyanoacrylate, demerol , ibuprofen, and wound dressing adhesive.  MEDICATIONS:  Current Outpatient Medications  Medication Sig Dispense Refill   albuterol  (VENTOLIN  HFA) 108 (90 Base) MCG/ACT inhaler INHALE TWO PUFFS BY MOUTH EVERY 6 HOURS AS NEEDED FOR WHEEZING OR SHORTNESS OF BREATH 8.5 g 1   Albuterol -Budesonide  (AIRSUPRA ) 90-80 MCG/ACT AERO Inhale 2 Inhalations into the lungs every 4 (four) hours as needed. 30 g 5   aspirin  EC 81 MG tablet Take 81 mg by mouth daily. Swallow whole.     benzonatate  (TESSALON ) 100 MG capsule Take 1 capsule (100 mg total) by mouth every 8 (eight) hours. (Patient taking differently: Take 100 mg by mouth 3 (three) times daily as needed for cough.) 21 capsule 0   DULoxetine  (CYMBALTA ) 20 MG capsule TAKE 2 CAPSULES BY MOUTH DAILY 180 capsule 0   gabapentin  (NEURONTIN ) 300 MG capsule Take 2 capsules (600 mg total) by mouth 3 (three) times daily. 540 capsule 0   lamoTRIgine  (LAMICTAL ) 200 MG tablet TAKE 2 TABLETS BY MOUTH DAILY 60 tablet 0   meclizine  (ANTIVERT ) 12.5 MG tablet TAKE ONE TABLET BY MOUTH TWICE A DAY AS NEEDED FOR DIZZINESS 20 tablet 0   metoprolol  tartrate (LOPRESSOR ) 25 MG tablet Take 0.5 tablets (12.5 mg total) by mouth daily. Please keep scheduled appointment for future refills. Thank you. 90 tablet 3   olmesartan  (BENICAR ) 20 MG tablet TAKE 1 TABLET BY MOUTH DAILY 30 tablet 0   tiZANidine  (ZANAFLEX ) 4 MG tablet Take 1 tablet (4 mg total) by mouth at bedtime.  30 tablet 0   traMADol  (ULTRAM ) 50 MG tablet Take 2 tablets (100 mg total) by mouth 2 (two) times daily. 120 tablet 0   warfarin (COUMADIN ) 7.5 MG tablet TAKE ONE TO ONE AND ONE-HALF (1 TO 1.5) TABLETS BY MOUTH DAILY AS DIRECTED BY ANTICOAGULATION CLINIC 40 tablet 2   No current facility-administered medications for this visit.     REVIEW OF SYSTEMS:   Constitutional: Denies fevers, chills or abnormal weight loss Eyes: Denies blurriness of vision Ears, nose, mouth, throat, and face: Denies mucositis or sore throat Respiratory: Denies cough, dyspnea or wheezes Cardiovascular: Denies palpitation, chest discomfort or lower extremity swelling Gastrointestinal:  Denies nausea, heartburn or change in bowel habits Skin: Denies abnormal skin rashes Lymphatics: Denies new lymphadenopathy or easy bruising Neurological:Denies numbness, tingling or new weaknesses Behavioral/Psych: Mood is stable, no new changes  All other systems were reviewed with the patient and are negative.   VITALS:   Today's Vitals   11/02/24 1340 11/02/24 1341  BP: 132/78   Pulse: 68   Resp: 17   Temp: (!) 96.6 F (35.9 C)   SpO2: 98%   Weight: 118 lb 11.2 oz (53.8 kg)  PainSc:  0-No pain   Body mass index is 23.57 kg/m.   Wt Readings from Last 3 Encounters:  11/03/24 115 lb (52.2 kg)  11/02/24 118 lb 11.2 oz (53.8 kg)  09/11/24 114 lb (51.7 kg)    Body mass index is 23.57 kg/m.  Performance status (ECOG): 1 - Symptomatic but completely ambulatory  PHYSICAL EXAM:   GENERAL:alert, no distress and comfortable SKIN: skin color, texture, turgor are normal, no rashes or significant lesions EYES: normal, Conjunctiva are pink and non-injected, sclera clear OROPHARYNX:no exudate, no erythema and lips, buccal mucosa, and tongue normal  NECK: supple, thyroid  normal size, non-tender, without nodularity LYMPH:  no palpable lymphadenopathy in the cervical, axillary or inguinal LUNGS: clear to auscultation and  percussion with normal breathing effort HEART: regular rate & rhythm and no murmurs and no lower extremity edema ABDOMEN:abdomen soft, non-tender and normal bowel sounds Musculoskeletal:no cyanosis of digits and no clubbing  NEURO: alert & oriented x 3 with fluent speech, no focal motor/sensory deficits  LABORATORY DATA:  I have reviewed the data as listed    Component Value Date/Time   NA 136 12/28/2023 0853   NA 144 11/24/2017 1218   K 3.4 (L) 12/28/2023 0853   CL 96 (L) 12/28/2023 0853   CO2 28 12/28/2023 0853   GLUCOSE 149 (H) 12/28/2023 0853   BUN 10 12/28/2023 0853   BUN 15 11/24/2017 1218   CREATININE 0.94 12/28/2023 0853   CALCIUM  9.0 12/28/2023 0853   PROT 7.3 09/09/2023 1126   ALBUMIN  4.8 09/09/2023 1126   AST 23 09/09/2023 1126   ALT 16 09/09/2023 1126   ALKPHOS 110 09/09/2023 1126   BILITOT 0.5 09/09/2023 1126   GFRNONAA >60 12/28/2023 0853   GFRAA >60 12/11/2018 0242    Lab Results  Component Value Date   WBC 5.2 11/02/2024   NEUTROABS 2.7 11/02/2024   HGB 13.9 11/02/2024   HCT 40.9 11/02/2024   MCV 92.3 11/02/2024   PLT 164 11/02/2024

## 2024-11-01 NOTE — Assessment & Plan Note (Addendum)
 We discussed common etiology of erythrocytosis (PV and secondary polycythemia), and thrombocytopenia, such as ITP, chronic hepatitis, liver disease, splenomegaly, medication induced, B12 deficiency, folate deficiency, and primary pulmonary disease.  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   -lab check today includes immature platelet count, B12, folate, erythropoietin  level, and MPN panel.  -prior labs were negative for HIV and hepatitis C.  Will call her with lab results.   -She is on Coumadin , so risk of thrombosis from erythrocytosis is low.  We discussed the risks of bleeding from thrombocytopenia since she is on Coumadin .She is followed by cardiology for INR monitoring and management of coumadin .  -if her platelet count is persistently less than 50K, she may require treatment such as platelet transfusion in the future.   - 11/02/2024-CBC, iron  panel, ferritin, and B12 levels, all within normal limits.  May continue vitamin B12 supplement daily.  Will monitor labs every 4 months and follow-up in 1 year, sooner if needed.

## 2024-11-02 ENCOUNTER — Inpatient Hospital Stay: Payer: Managed Care, Other (non HMO) | Attending: Hematology

## 2024-11-02 ENCOUNTER — Ambulatory Visit: Attending: Cardiology | Admitting: *Deleted

## 2024-11-02 ENCOUNTER — Inpatient Hospital Stay: Payer: Managed Care, Other (non HMO) | Admitting: Nurse Practitioner

## 2024-11-02 VITALS — BP 132/78 | HR 68 | Temp 96.6°F | Resp 17 | Wt 118.7 lb

## 2024-11-02 DIAGNOSIS — R079 Chest pain, unspecified: Secondary | ICD-10-CM | POA: Insufficient documentation

## 2024-11-02 DIAGNOSIS — Z954 Presence of other heart-valve replacement: Secondary | ICD-10-CM

## 2024-11-02 DIAGNOSIS — D696 Thrombocytopenia, unspecified: Secondary | ICD-10-CM | POA: Diagnosis not present

## 2024-11-02 DIAGNOSIS — Z7901 Long term (current) use of anticoagulants: Secondary | ICD-10-CM | POA: Diagnosis not present

## 2024-11-02 DIAGNOSIS — D071 Carcinoma in situ of vulva: Secondary | ICD-10-CM | POA: Insufficient documentation

## 2024-11-02 DIAGNOSIS — Z87410 Personal history of cervical dysplasia: Secondary | ICD-10-CM | POA: Insufficient documentation

## 2024-11-02 DIAGNOSIS — R059 Cough, unspecified: Secondary | ICD-10-CM | POA: Diagnosis not present

## 2024-11-02 DIAGNOSIS — D649 Anemia, unspecified: Secondary | ICD-10-CM

## 2024-11-02 DIAGNOSIS — Z5181 Encounter for therapeutic drug level monitoring: Secondary | ICD-10-CM

## 2024-11-02 LAB — POCT INR: POC INR: 3.6

## 2024-11-02 LAB — IRON AND IRON BINDING CAPACITY (CC-WL,HP ONLY)
Iron: 57 ug/dL (ref 28–170)
Saturation Ratios: 19 % (ref 10.4–31.8)
TIBC: 298 ug/dL (ref 250–450)
UIBC: 242 ug/dL

## 2024-11-02 LAB — CBC WITH DIFFERENTIAL (CANCER CENTER ONLY)
Abs Immature Granulocytes: 0.01 K/uL (ref 0.00–0.07)
Basophils Absolute: 0.1 K/uL (ref 0.0–0.1)
Basophils Relative: 1 %
Eosinophils Absolute: 0.2 K/uL (ref 0.0–0.5)
Eosinophils Relative: 3 %
HCT: 40.9 % (ref 36.0–46.0)
Hemoglobin: 13.9 g/dL (ref 12.0–15.0)
Immature Granulocytes: 0 %
Lymphocytes Relative: 37 %
Lymphs Abs: 1.9 K/uL (ref 0.7–4.0)
MCH: 31.4 pg (ref 26.0–34.0)
MCHC: 34 g/dL (ref 30.0–36.0)
MCV: 92.3 fL (ref 80.0–100.0)
Monocytes Absolute: 0.4 K/uL (ref 0.1–1.0)
Monocytes Relative: 7 %
Neutro Abs: 2.7 K/uL (ref 1.7–7.7)
Neutrophils Relative %: 52 %
Platelet Count: 164 K/uL (ref 150–400)
RBC: 4.43 MIL/uL (ref 3.87–5.11)
RDW: 13.8 % (ref 11.5–15.5)
WBC Count: 5.2 K/uL (ref 4.0–10.5)
nRBC: 0 % (ref 0.0–0.2)

## 2024-11-02 LAB — FERRITIN: Ferritin: 51 ng/mL (ref 11–307)

## 2024-11-02 LAB — VITAMIN B12: Vitamin B-12: 695 pg/mL (ref 180–914)

## 2024-11-02 NOTE — Patient Instructions (Signed)
 Description   INR 3.6; Hold warfarin tomorrow then Start taking warfarin 1 tablet daily except for 1.5 tablets on Mondays and Fridays. Recheck INR in 3 weeks. Coumadin  Clinic [phone]

## 2024-11-02 NOTE — Progress Notes (Signed)
 Lab Results  Component Value Date   INR 3.6 11/02/2024   INR 3.2 10/12/2024   INR 4.1 (A) 09/14/2024    Description   INR 3.6; Hold warfarin tomorrow then Start taking warfarin 1 tablet daily except for 1.5 tablets on Mondays and Fridays. Recheck INR in 3 weeks. Coumadin  Clinic [phone]

## 2024-11-03 ENCOUNTER — Other Ambulatory Visit: Payer: Self-pay | Admitting: Internal Medicine

## 2024-11-03 ENCOUNTER — Encounter: Payer: Self-pay | Admitting: Gynecologic Oncology

## 2024-11-03 ENCOUNTER — Inpatient Hospital Stay: Admitting: Gynecologic Oncology

## 2024-11-03 VITALS — BP 150/88 | HR 70 | Temp 98.3°F | Resp 19 | Wt 115.0 lb

## 2024-11-03 DIAGNOSIS — Z87410 Personal history of cervical dysplasia: Secondary | ICD-10-CM | POA: Diagnosis not present

## 2024-11-03 DIAGNOSIS — D071 Carcinoma in situ of vulva: Secondary | ICD-10-CM

## 2024-11-03 DIAGNOSIS — F3177 Bipolar disorder, in partial remission, most recent episode mixed: Secondary | ICD-10-CM

## 2024-11-03 DIAGNOSIS — I10 Essential (primary) hypertension: Secondary | ICD-10-CM

## 2024-11-03 NOTE — Patient Instructions (Signed)
 It was good to see you today.  I will let you know when I get your Pap and HPV testing back.  We will plan on follow-up in 6 months.  Please call with any new symptoms or spots.

## 2024-11-03 NOTE — Progress Notes (Signed)
 Gynecologic Oncology Return Clinic Visit  11/03/24  Reason for Visit: follow-up   Treatment History: Dysplasia history:  03/19/22: Vaginal cuff biopsy - VAIN1; left buttocks biopsy - VIN3, Perineal biopsy - VIN 2-3, mons biopsy - VIN 2-3. 01/2022 Vaginal pap - negative, HR HPV positive 12/2018: Pap - negative, HR HPV positive (negative for 16, 18 and 45) 05/2017: vaginal biopsies - one with benign squamous mucosa, other with VAIN I 04/2017: Vaginal pap - negative, HR HPV not detected. 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 Vaginal biopsies, one with VAIN 2, other VAIN 1. 10/2015: vaginal cuff biopsy: VAIN II.  Vulvar biopsies performed including mons and right labia, both showing condyloma. 08/2015: Vaginal pap - LSIL, HR HPV positive. 02/2014: Vaginal cuff biopsy showed condylomata acuminata, p16 negative for diffuse positive staining.  No malignancy or high-grade dysplasia.  Right labial biopsy shows condyloma. 01/2014: Vaginal pap - LSIL.  Reactive squamous cells present. HR HPV positive. 01/2009: Pathology from hysterectomy shows chronic mild inflammation of the cervix and endocervix, mesonephric hyperplasia.  No evidence of malignancy.  Secretory type endometrium. 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  Endo and ectocervical margins negative for mucosa but partially denuded.  Stromal invasion noted. 10/2008: Cervical biopsy at 6:00 shows HPV effect consistent with low-grade dysplasia.  ECC with 1 fragment of ectocervical/squamous metaplasia with low-grade intraepithelial lesion and focal changes consistent with CIN-2. 07/2020: ECC shows benign endocervix, no dysplasia. 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  Cervical biopsy at 1:00 showed squamous mucosa with koilocytic atypia, no dysplasia identified.  ECC shows rare detached fragments of slightly  dysplastic squamous mucosa with benign endocervical mucosa.   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   07/23/22: Vulvar biopsy given symptoms at prior healing incision shows VIN3.    08/12/22: Partial simple anterior left vulvectomy. Pathology: VIN3, margins negative. Right mons biopsy and posterior fourchette biopsy negative for dysplasia or malignancy.   01/07/23: Left peri-anal biopsy shows reactive squamous mucosa, no dysplasia.   08/26/23: Pap - ASCUS, HR HPV + (16/18/45 negative)   10/2023: Colposcopy - vaginal cuff biopsy with VAIN1   Last seen in the office by Dr. Viktoria on 04/27/2024 with plan for follow up in six months.  New mons and vulvar biopsy at time of October 2025 visit, biopsies performed. Left vulvar biopsy with VIN1. Right mons biopsy with inflammatory scale crust, no epithelium present.  Interval History: Overall doing well.  Denies any vulvar symptoms.  Notes that area on her mons that was biopsied at her last visit ended up being an ingrown hair that has completely resolved.  Is currently struggling with her hip and back pain and diarrhea.  Unsure if diarrhea is related at least in part to her pain.  Past Medical/Surgical History: Past Medical History:  Diagnosis Date   ADD (attention deficit disorder)    Alcohol abuse, in remission 2012   per pt in remission since 2012   Anticoagulated on Coumadin     coumadin --- managed by cardiology/ coumadin  clinic   Arthritis    In hips   Asthma    allergy induced, rare inhaler use   Auditory processing disorder    per pt very HOH, first language ASL   Basal cell carcinoma    Bipolar disorder (HCC)    Chronic left hip pain  Chronic vertigo    Complication of anesthesia    per pt due to auditory processing disorder pt is unable to hear when waking up and also gets very anxious   Family history of uterine cancer    GAD (generalized anxiety disorder)    GERD  (gastroesophageal reflux disease)    History of cervical dysplasia    History of condyloma acuminatum 2017   s/p laser ablation vulva   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   biopsy and CO2 laser ablation   HOH (hard of hearing)    per pt very HOH due to auditroy processing disorder   Hyperlipidemia    Hypertension    Muscle spasms of both lower extremities    both hips   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   23 mm Sorin [employer] [place] bileaflet mechanical valve via right anterior mini thoracotomy   Vulvar dysplasia     Past Surgical History:  Procedure Laterality Date   ANTERIOR CRUCIATE LIGAMENT REPAIR  1993   AORTIC VALVE REPLACEMENT N/A 11/03/2017   Procedure: MINIMALLY INVASIVE AORTIC VALVE REPLACEMENT( MINI THORACOTOMY);  Surgeon: Dusty Sudie DEL, MD;  Location: [facility] OR;  Service: Open Heart Surgery;  Laterality: N/A;   CERVICAL BIOPSY  W/ LOOP ELECTRODE EXCISION  2010   CIN III w/extension to glands   CHOLECYSTECTOMY N/A 07/11/2023   Procedure: LAPAROSCOPIC CHOLECYSTECTOMY WITH POSSIBLE INTRAOPERATIVE CHOLANGIOGRAM;  Surgeon: Signe Mitzie LABOR, MD;  Location: [facility] OR;  Service: General;  Laterality: N/A;   CLOSED REDUCTION HIP DISLOCATION Left 1981   CO2 LASER APPLICATION N/A 05/12/2022   Procedure: CO2 LASER APPLICATION TO VULVA;  Surgeon: Viktoria Comer SAUNDERS, MD;  Location: [facility];  Service: Gynecology;  Laterality: N/A;   COLPOSCOPY  10/30/2008   CIN I & II   COLPOSCOPY  07/31/2000   Neg. ECC   COLPOSCOPY  06/30/2001   CIN I   COLPOSCOPY  08/30/2004   ECC--atypia   COLPOSCOPY N/A 01/21/2016   Procedure: COLPOSCOPY with vaginal biopsy with CO 2 Laser of Vaginal and vulvar condyloma;  Surgeon: Bobie FORBES Cathlyn JAYSON Nikki, MD;  Location: [facility] ORS;  Service: Gynecology;  Laterality: N/A;  [name] will be here 2/21 for 1115 case confirmed 01/16/15 - [facility]   HARDWARE REMOVAL Left 1992   Left hip    INGUINAL HERNIA REPAIR Bilateral    1981;  [id number]   LEFT AND RIGHT HEART CATHETERIZATION WITH CORONARY ANGIOGRAM N/A 05/18/2014   Procedure: LEFT AND RIGHT HEART CATHETERIZATION WITH CORONARY ANGIOGRAM;  Surgeon: Candyce GORMAN Reek, MD;  Location: [facility] CATH LAB;  Service: Cardiovascular;  Laterality: N/A;   LESION EXCISION WITH COMPLEX REPAIR N/A 05/23/2024   Procedure: LESION EXCISION WITH COMPLEX REPAIR;  Surgeon: Waddell Leonce NOVAK, MD;  Location: [facility];  Service: Plastics;  Laterality: N/A;  Excision of facial lesion, right upper lip   LESION REMOVAL Right 05/12/2022   Procedure: ANTONETTE OF RIGHT CHEEK CYST;  Surgeon: Elisabeth Craig GORMAN, MD;  Location: [facility];  Service: Plastics;  Laterality: Right;   LYMPH NODE BIOPSY  1995   NASAL SEPTUM SURGERY  2002   ORIF HIP FRACTURE Left 1990   per pt and  took out growth plate in Right knee   ROBOTIC ASSISTED TOTAL HYSTERECTOMY  02/26/2009   @[facility]    TEE WITHOUT CARDIOVERSION N/A 11/03/2017   Procedure: TRANSESOPHAGEAL ECHOCARDIOGRAM (TEE);  Surgeon: Dusty Sudie DEL, MD;  Location: [facility] OR;  Service: Open Heart Surgery;  Laterality: N/A;   TONSILLECTOMY AND ADENOIDECTOMY  1990   TYMPANOSTOMY TUBE PLACEMENT Bilateral    x3  per pt last one 1982   VULVA /PERINEUM BIOPSY N/A 05/12/2022   Procedure: POSSIBLE VULVAR BIOPSY; POSSIBLE VAGINAL BIOPSY;  Surgeon: Viktoria Comer SAUNDERS, MD;  Location: [facility];  Service: Gynecology;  Laterality: N/A;   VULVECTOMY N/A 05/12/2022   Procedure: WIDE EXCISION VULVECTOMY;  Surgeon: Viktoria Comer SAUNDERS, MD;  Location: [facility];  Service: Gynecology;  Laterality: N/A;   VULVECTOMY N/A 08/12/2022   Procedure: WIDE LOCAL EXCISION VULVECTOMY;  Surgeon: Viktoria Comer SAUNDERS, MD;  Location[facility];  Service: Gynecology;  Laterality: N/A;    Family History  Problem Relation Age of Onset   Hyperlipidemia Mother    Hypertension Mother     Thyroid  disease Mother    Other Mother        Pituitary tumor   Hyperlipidemia Father    Hypertension Father    Migraines Father    Skin cancer Father    Hypertension Brother    Hyperlipidemia Brother    Heart attack Paternal Uncle    Thyroid  disease Maternal Grandmother    Uterine cancer Maternal Grandmother 2   Dementia Maternal Grandmother    Thyroid  disease Maternal Grandfather    Prostate cancer Maternal Grandfather 53   Dementia Paternal Grandmother    Heart attack Paternal Grandfather    Colon cancer Paternal Great-grandmother     Social History   Socioeconomic History   Marital status: Single    Spouse name: Not on file   Number of children: Not on file   Years of education: Not on file   Highest education level: Not on file  Occupational History   Not on file  Tobacco Use   Smoking status: Every Day    Current packs/day: 0.25    Average packs/day: 0.3 packs/day for 20.0 years (5.0 ttl pk-yrs)    Types: Cigarettes   Smokeless tobacco: Never  Vaping Use   Vaping status: Never Used  Substance and Sexual Activity   Alcohol use: No    Comment: in remission since 2012   Drug use: Yes    Types: Marijuana    Comment: 08-11-22 last, per pt smokes on average 1/4oz per week   Sexual activity: Yes    Partners: Male    Birth control/protection: Surgical    Comment: Hysterectomy  Other Topics Concern   Not on file  Social History Narrative   Not on file   Social Drivers of Health   Financial Resource Strain: Low Risk  (12/10/2018)   Overall Financial Resource Strain (CARDIA)    Difficulty of Paying Living Expenses: Not hard at all  Food Insecurity: No Food Insecurity (07/07/2023)   Hunger Vital Sign    Worried About Running Out of Food in the Last Year: Never true    Ran Out of Food in the Last Year: Never true  Transportation Needs: No Transportation Needs (07/07/2023)   PRAPARE - Administrator, Civil Service (Medical): No    Lack of Transportation  (Non-Medical): No  Physical Activity: Unknown (12/10/2018)   Exercise Vital Sign    Days of Exercise per Week: Patient declined    Minutes of Exercise per Session: Patient declined  Stress: Not on file  Social Connections: Unknown (12/10/2018)   Social Connection and Isolation Panel    Frequency of Communication with Friends and Family: Patient declined  Frequency of Social Gatherings with Friends and Family: Patient declined    Attends Religious Services: Patient declined    Database Administrator or Organizations: Patient declined    Attends Engineer, Structural: Patient declined    Marital Status: Patient declined    Current Medications:  Current Outpatient Medications:    albuterol  (VENTOLIN  HFA) 108 (90 Base) MCG/ACT inhaler, INHALE TWO PUFFS BY MOUTH EVERY 6 HOURS AS NEEDED FOR WHEEZING OR SHORTNESS OF BREATH, Disp: 8.5 g, Rfl: 1   Albuterol -Budesonide  (AIRSUPRA ) 90-80 MCG/ACT AERO, Inhale 2 Inhalations into the lungs every 4 (four) hours as needed., Disp: 30 g, Rfl: 5   aspirin  EC 81 MG tablet, Take 81 mg by mouth daily. Swallow whole., Disp: , Rfl:    benzonatate  (TESSALON ) 100 MG capsule, Take 1 capsule (100 mg total) by mouth every 8 (eight) hours. (Patient taking differently: Take 100 mg by mouth 3 (three) times daily as needed for cough.), Disp: 21 capsule, Rfl: 0   DULoxetine  (CYMBALTA ) 20 MG capsule, TAKE 2 CAPSULES BY MOUTH DAILY, Disp: 180 capsule, Rfl: 0   gabapentin  (NEURONTIN ) 300 MG capsule, Take 2 capsules (600 mg total) by mouth 3 (three) times daily., Disp: 540 capsule, Rfl: 0   lamoTRIgine  (LAMICTAL ) 200 MG tablet, TAKE 2 TABLETS BY MOUTH DAILY, Disp: 60 tablet, Rfl: 0   meclizine  (ANTIVERT ) 12.5 MG tablet, TAKE ONE TABLET BY MOUTH TWICE A DAY AS NEEDED FOR DIZZINESS, Disp: 20 tablet, Rfl: 0   metoprolol  tartrate (LOPRESSOR ) 25 MG tablet, Take 0.5 tablets (12.5 mg total) by mouth daily. Please keep scheduled appointment for future refills. Thank you., Disp:  90 tablet, Rfl: 3   olmesartan  (BENICAR ) 20 MG tablet, TAKE 1 TABLET BY MOUTH DAILY, Disp: 30 tablet, Rfl: 0   tiZANidine  (ZANAFLEX ) 4 MG tablet, Take 1 tablet (4 mg total) by mouth at bedtime., Disp: 30 tablet, Rfl: 0   traMADol  (ULTRAM ) 50 MG tablet, Take 2 tablets (100 mg total) by mouth 2 (two) times daily., Disp: 120 tablet, Rfl: 0   warfarin (COUMADIN ) 7.5 MG tablet, TAKE ONE TO ONE AND ONE-HALF (1 TO 1.5) TABLETS BY MOUTH DAILY AS DIRECTED BY ANTICOAGULATION CLINIC, Disp: 40 tablet, Rfl: 2  Review of Systems: + appetite changes, diarrhea Denies fevers, chills, fatigue, unexplained weight changes. Denies hearing loss, neck lumps or masses, mouth sores, ringing in ears or voice changes. Denies cough or wheezing.  Denies shortness of breath. Denies chest pain or palpitations. Denies leg swelling. Denies abdominal distention, pain, blood in stools, constipation, nausea, vomiting, or early satiety. Denies pain with intercourse, dysuria, frequency, hematuria or incontinence. Denies hot flashes, pelvic pain, vaginal bleeding or vaginal discharge.   Denies joint pain, back pain or muscle pain/cramps. Denies itching, rash, or wounds. Denies dizziness, headaches, numbness or seizures. Denies swollen lymph nodes or glands, denies easy bruising or bleeding. Denies anxiety, depression, confusion, or decreased concentration.  Physical Exam: BP (!) 147/91 (BP Location: Left Arm, Patient Position: Sitting)   Pulse 70   Temp 98.3 F (36.8 C) (Oral)   Resp 19   Wt 115 lb (52.2 kg)   SpO2 99%   BMI 22.84 kg/m  General: Alert, oriented, no acute distress. HEENT: Normocephalic, atraumatic, sclera anicteric. Chest: Unlabored breathing on room air. Breast: No axillary adenopathy palpated.  On the right, there is a small area of slightly hardened and raised tissue along the inferior aspect of the breast at approximately 5-6:00, mildly tender with palpation.  No firmness or masses  appreciated of the  left breast. GU: Hypopigmentation of a 4 x 5 cm area along the right mons, unchanged.  No areas of leukoplakia or desquamation posteriorly.  The skin along the perineum is somewhat thinned but otherwise normal in appearance.  Several hypopigmented lesions consistent with prior laser therapy.  On speculum exam, vaginal mucosa is well-rugated, no visible lesions.  Physiologic discharge at the vaginal apex.  Pap and HPV collected.  Laboratory & Radiologic Studies: None new  Assessment & Plan: XIOMARA SEVILLANO is a 43 y.o. 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  Developed symptoms at the site of her wide local excision with biopsy proven VIN3 resected in 07/2022 with negative margins.  Recent Pap ASCUS, high risk HPV positive. Colposcopy with VAIN1 on biopsy   Doing well today.    Reviewed biopsies from her recent visit.  Area on her mons has visibly and symptomatically resolved.  Discussed plan for close monitoring in the setting of low-grade dysplasia on her vulva.  No concerning lesions seen today.  Pap and HPV collected today.  Breast exam with what I think is likely some scar tissue along the inferior aspect of her right breast.  Patient is due for mammogram.  Encouraged her to call imaging center to schedule her mammogram.  20 minutes of total time was spent for this patient encounter, including preparation, face-to-face counseling with the patient and coordination of care, and documentation of the encounter.  Comer Dollar, MD  Division of Gynecologic Oncology  Department of Obstetrics and Gynecology  [facility]

## 2024-11-05 ENCOUNTER — Encounter: Payer: Self-pay | Admitting: Nurse Practitioner

## 2024-11-05 NOTE — Addendum Note (Signed)
 Addended by: HANFORD MOATS on: 11/05/2024 11:28 PM   Modules accepted: Orders

## 2024-11-08 NOTE — Progress Notes (Signed)
 [name] [address] [place] [address] Phone: [phone] Subjective:   ISusannah Obrien, am serving as a scribe for Dr. Arthea Claudene.  I'm seeing this patient by the request  of:  Joshua Debby CROME, MD  CC: Multiple joint complaints  Danielle Obrien  06/22/2024 Contusion, patient is on anticoagulation.  Discussed icing, Arnica, discussed heel lifts.  Follow-up with me again in 3 months     Update 11/09/2024 Danielle Obrien is a 43 y.o. female coming in with complaint of L calf pain. Patient states  L knee and posterior hip has been the worse. Hitting both sides. Having a hard time lifting L leg.    Patient is on chronic pain contract.  Has been taking tramadol .  100 mg twice a day.  Difficulty with treating secondary to aortic stenosis chronic blood thinner, thrombocytopenia.  Past Medical History:  Diagnosis Date   ADD (attention deficit disorder)    Alcohol abuse, in remission 2012   per pt in remission since 2012   Anticoagulated on Coumadin     coumadin --- managed by cardiology/ coumadin  clinic   Arthritis    In hips   Asthma    allergy induced, rare inhaler use   Auditory processing disorder    per pt very HOH, first language ASL   Basal cell carcinoma    Bipolar disorder (HCC)    Chronic left hip pain    Chronic vertigo    Complication of anesthesia    per pt due to auditory processing disorder pt is unable to hear when waking up and also gets very anxious   Family history of uterine cancer    GAD (generalized anxiety disorder)    GERD (gastroesophageal reflux disease)    History of cervical dysplasia    History of condyloma acuminatum 2017   s/p laser ablation vulva   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   biopsy and CO2 laser ablation   HOH (hard of hearing)    per pt very HOH due to auditroy processing disorder   Hyperlipidemia    Hypertension    Muscle spasms of both lower extremities    both hips   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 11/03/2017   23 mm Sorin [employer] [place] bileaflet mechanical valve via right anterior mini thoracotomy   Vulvar dysplasia    Past Surgical History:  Procedure Laterality Date   ANTERIOR CRUCIATE LIGAMENT REPAIR  1993   AORTIC VALVE REPLACEMENT N/A 11/03/2017   Procedure: MINIMALLY INVASIVE AORTIC VALVE REPLACEMENT( MINI THORACOTOMY);  Surgeon: Dusty Sudie DEL, MD;  Location: [facility] OR;  Service: Open Heart Surgery;  Laterality: N/A;   CERVICAL BIOPSY  W/ LOOP ELECTRODE EXCISION  2010   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 ECC   COLPOSCOPY  06/30/2001   CIN I   COLPOSCOPY  08/30/2004   ECC--atypia   COLPOSCOPY N/A 01/21/2016   Procedure: COLPOSCOPY with vaginal biopsy  with CO 2 Laser of Vaginal and vulvar condyloma;  Surgeon: Bobie FORBES Cathlyn JAYSON Nikki, MD;  Location: [facility];  Service: Gynecology;  Laterality: N/A;  [name] will be here 2/21 for 1115 case confirmed 01/16/15 - TS   HARDWARE REMOVAL Left 1992   Left hip   INGUINAL HERNIA REPAIR Bilateral    1981;  [id number]   LEFT AND RIGHT HEART CATHETERIZATION WITH CORONARY ANGIOGRAM N/A 05/18/2014   Procedure: LEFT AND RIGHT HEART CATHETERIZATION WITH CORONARY ANGIOGRAM;  Surgeon: Candyce GORMAN Reek, MD;  Location: [facility] CATH [facility];  Service: Cardiovascular;  Laterality: N/A;   LESION EXCISION WITH COMPLEX REPAIR N/A 05/23/2024   Procedure: LESION EXCISION WITH  COMPLEX REPAIR;  Surgeon: Waddell Leonce NOVAK, MD;  Location: [facility];  Service: Plastics;  Laterality: N/A;  Excision of facial lesion, right upper lip   LESION REMOVAL Right 05/12/2022   Procedure: ANTONETTE OF RIGHT CHEEK CYST;  Surgeon: Elisabeth Craig GORMAN, MD;  Location: [facility];  Service: Plastics;  Laterality: Right;   LYMPH NODE BIOPSY  1995   NASAL SEPTUM SURGERY  2002   ORIF HIP FRACTURE Left 1990   per pt and  took out growth plate in Right knee   ROBOTIC ASSISTED TOTAL HYSTERECTOMY  02/26/2009   @[facility]    TEE WITHOUT CARDIOVERSION N/A 11/03/2017   Procedure: TRANSESOPHAGEAL ECHOCARDIOGRAM (TEE);  Surgeon: Dusty Sudie DEL, MD;  Location: [facility] OR;  Service: Open Heart Surgery;  Laterality: N/A;   TONSILLECTOMY AND ADENOIDECTOMY  1990   TYMPANOSTOMY TUBE PLACEMENT Bilateral    x3  per pt last one 1982   VULVA /PERINEUM BIOPSY N/A 05/12/2022   Procedure: POSSIBLE VULVAR BIOPSY; POSSIBLE VAGINAL BIOPSY;  Surgeon: Viktoria Comer SAUNDERS, MD;  Location: [facility];  Service: Gynecology;  Laterality: N/A;   VULVECTOMY N/A 05/12/2022   Procedure: WIDE EXCISION VULVECTOMY;  Surgeon: Viktoria Comer SAUNDERS, MD;  Location: [facility];  Service: Gynecology;  Laterality: N/A;   VULVECTOMY N/A 08/12/2022   Procedure: WIDE LOCAL EXCISION VULVECTOMY;  Surgeon: Viktoria Comer SAUNDERS, MD;  Location: [facility];  Service: Gynecology;  Laterality: N/A;   Social History   Socioeconomic History   Marital status: Single    Spouse name: Not on file   Number of children: Not on file   Years of education: Not on file   Highest education level: Not on file  Occupational History   Not on file  Tobacco Use   Smoking status: Every Day    Current packs/day: 0.25    Average packs/day: 0.3 packs/day for 20.0 years (5.0 ttl pk-yrs)    Types: Cigarettes   Smokeless tobacco: Never  Vaping Use   Vaping status: Never Used  Substance and Sexual  Activity   Alcohol use: No    Comment: in remission since 2012   Drug use: Yes    Types: Marijuana    Comment: 08-11-22 last, per pt smokes on average 1/4oz per week   Sexual activity: Yes    Partners: Male    Birth control/protection: Surgical    Comment: Hysterectomy  Other Topics Concern   Not on file  Social History Narrative   Not on file   Social Drivers of Health   Tobacco Use: High Risk (11/05/2024)   Patient History    Smoking Tobacco Use: Every Day    Smokeless Tobacco Use: Never    Passive Exposure: Not on file  Financial Resource Strain: Not on file  Food Insecurity: No Food Insecurity (07/07/2023)   Hunger Vital Sign    Worried About Running Out of Food in the Last Year: Never true    Ran Out of Food in the Last Year: Never true  Transportation Needs: No Transportation Needs (07/07/2023)   PRAPARE - Administrator, Civil Service (Medical): No    Lack of Transportation (Non-Medical): No  Physical Activity: Not on file  Stress: Not on file  Social Connections: Not on file  Depression (PHQ2-9): Low Risk (11/02/2024)   Depression (PHQ2-9)    PHQ-2 Score: 0  Alcohol Screen: Not on file  Housing: Low Risk (07/07/2023)   Housing    Last Housing Risk Score: 0  Utilities: Not At Risk (07/07/2023)   AHC Utilities    Threatened with loss of utilities: No  Health Literacy: Not on file   Allergies  Allergen Reactions   Cyanoacrylate    Demerol  Nausea And Vomiting   Ibuprofen Other (See Comments)    Gastritis    Wound Dressing Adhesive Rash    Dermabond allergy    Family History  Problem Relation Age of Onset   Hyperlipidemia Mother    Hypertension Mother    Thyroid  disease Mother    Other Mother        Pituitary tumor   Hyperlipidemia Father    Hypertension Father    Migraines Father    Skin cancer Father    Hypertension Brother    Hyperlipidemia Brother    Heart attack Paternal Uncle    Thyroid  disease Maternal Grandmother    Uterine cancer  Maternal Grandmother 47   Dementia Maternal Grandmother    Thyroid  disease Maternal Grandfather    Prostate cancer Maternal Grandfather 82   Dementia Paternal Grandmother    Heart attack Paternal Grandfather    Colon cancer Paternal Great-grandmother     Current Outpatient Medications (Cardiovascular):    metoprolol  tartrate (LOPRESSOR ) 25 MG tablet, Take 0.5 tablets (12.5 mg total) by mouth daily.   olmesartan  (BENICAR ) 20 MG tablet, TAKE 1 TABLET BY MOUTH DAILY  Current Outpatient Medications (Respiratory):    albuterol  (VENTOLIN  HFA) 108 (90 Base) MCG/ACT inhaler, INHALE TWO PUFFS BY MOUTH EVERY 6 HOURS AS NEEDED FOR WHEEZING OR SHORTNESS OF BREATH   Albuterol -Budesonide  (AIRSUPRA ) 90-80 MCG/ACT AERO, Inhale 2 Inhalations into the lungs every 4 (four) hours as needed.   benzonatate  (TESSALON ) 100 MG capsule, Take 1 capsule (100 mg total) by mouth every 8 (eight) hours. (Patient taking differently: Take 100 mg by mouth 3 (three) times daily as needed for cough.)  Current Outpatient Medications (Analgesics):    aspirin  EC 81 MG tablet, Take 81 mg by mouth daily. Swallow whole.   traMADol  (ULTRAM ) 50 MG tablet, Take 2 tablets (100 mg total) by mouth 2 (two) times daily.  Current Outpatient Medications (Hematological):    warfarin (COUMADIN ) 7.5 MG tablet, TAKE ONE TO ONE AND ONE-HALF (1 TO 1.5) TABLETS BY MOUTH DAILY AS DIRECTED BY ANTICOAGULATION CLINIC  Current Outpatient Medications (Other):    DULoxetine  (CYMBALTA ) 20 MG capsule, TAKE 2 CAPSULES BY MOUTH DAILY   gabapentin  (NEURONTIN ) 300 MG capsule, Take 2 capsules (600 mg total) by mouth 3 (three) times daily.   lamoTRIgine  (LAMICTAL ) 200 MG tablet, TAKE 2 TABLETS BY MOUTH DAILY   meclizine  (ANTIVERT ) 12.5 MG tablet, TAKE ONE TABLET BY MOUTH TWICE A DAY AS NEEDED FOR DIZZINESS   tiZANidine  (ZANAFLEX ) 4 MG tablet, Take 1 tablet (4 mg total) by mouth at bedtime.  Reviewed prior external information including notes and imaging  from  primary care provider As well as notes that were available from care everywhere and other healthcare systems.  Past medical history, social, surgical and family history all reviewed in electronic medical record.  No pertanent information unless stated regarding to the chief complaint.   Review of Systems:  No headache, visual changes, nausea, vomiting, diarrhea, constipation, dizziness, abdominal pain, skin rash, fevers, chills, night sweats, weight loss, swollen lymph node., joint swelling, chest pain, shortness of breath, mood changes. POSITIVE muscle aches, body aches  Objective  Blood pressure 124/84, pulse 74, height 4' 11 (1.499 m), weight 113 lb (51.3 kg), SpO2 98%.   General: No apparent distress alert and oriented x3 mood and affect normal, mild hard of hearing patient is accompanied with a sign language interpreter dressed appropriately.  HEENT: Pupils equal, extraocular movements intact  Respiratory: Patient's speak in full sentences and does not appear short of breath  Cardiovascular: No lower extremity edema, non tender, no erythema  Patient's low back does have some loss of lordosis noted.  Some tightness with straight leg test.  No true radicular symptoms.  Right shoulder does show some positive impingement noted.    Impression and Recommendations:     The above documentation has been reviewed and is accurate and complete Wei Poplaski M Domanique Luckett, DO

## 2024-11-09 ENCOUNTER — Encounter: Payer: Self-pay | Admitting: Family Medicine

## 2024-11-09 ENCOUNTER — Ambulatory Visit: Admitting: Family Medicine

## 2024-11-09 VITALS — BP 124/84 | HR 74 | Ht 59.0 in | Wt 113.0 lb

## 2024-11-09 DIAGNOSIS — M545 Low back pain, unspecified: Secondary | ICD-10-CM

## 2024-11-09 DIAGNOSIS — F1190 Opioid use, unspecified, uncomplicated: Secondary | ICD-10-CM | POA: Diagnosis not present

## 2024-11-09 DIAGNOSIS — M546 Pain in thoracic spine: Secondary | ICD-10-CM | POA: Diagnosis not present

## 2024-11-09 DIAGNOSIS — M5030 Other cervical disc degeneration, unspecified cervical region: Secondary | ICD-10-CM | POA: Diagnosis not present

## 2024-11-09 DIAGNOSIS — M1712 Unilateral primary osteoarthritis, left knee: Secondary | ICD-10-CM

## 2024-11-09 DIAGNOSIS — M2170 Unequal limb length (acquired), unspecified site: Secondary | ICD-10-CM

## 2024-11-09 DIAGNOSIS — M542 Cervicalgia: Secondary | ICD-10-CM

## 2024-11-09 MED ORDER — KETOROLAC TROMETHAMINE 30 MG/ML IJ SOLN
30.0000 mg | Freq: Once | INTRAMUSCULAR | Status: AC
  Administered 2024-11-09: 30 mg via INTRAMUSCULAR

## 2024-11-09 MED ORDER — METHYLPREDNISOLONE ACETATE 40 MG/ML IJ SUSP
40.0000 mg | Freq: Once | INTRAMUSCULAR | Status: AC
  Administered 2024-11-09: 40 mg via INTRAMUSCULAR

## 2024-11-09 NOTE — Assessment & Plan Note (Signed)
 Chronic problem, will continue to monitor, worsening pain can consider injections again at a later date.

## 2024-11-09 NOTE — Patient Instructions (Addendum)
 Labs today Good to see you! See you again in 6 months Cocktail injection. No NSAIDs for 24hrs.

## 2024-11-09 NOTE — Assessment & Plan Note (Signed)
 Difficult to treat with this as well as the patellofemoral arthritis.  Multiple different aches noted today.  Patient is on a blood thinner and does have a mechanical valve replacement.  Patient is on a pain contract [place]  registry noted, UDS obtained today.  Follow-up with me again in 3 months.  Refill tramadol  100 mg twice daily no change made dosing.  Due to the severity of different pains Toradol  and Depo-Medrol  injections given today

## 2024-11-09 NOTE — Assessment & Plan Note (Signed)
 Continued lift

## 2024-11-10 ENCOUNTER — Ambulatory Visit: Payer: Self-pay | Admitting: Gynecologic Oncology

## 2024-11-10 LAB — CYTOLOGY - PAP
Comment: NEGATIVE
Diagnosis: UNDETERMINED — AB
High risk HPV: POSITIVE — AB

## 2024-11-11 LAB — DRUG MONITORING, PANEL 8 WITH CONFIRMATION, URINE
6 Acetylmorphine: NEGATIVE ng/mL (ref <10)
Alcohol Metabolites: NEGATIVE ng/mL (ref <500)
Amphetamines: NEGATIVE ng/mL (ref <500)
Benzodiazepines: NEGATIVE ng/mL (ref <100)
Buprenorphine, Urine: NEGATIVE ng/mL (ref <5)
Cocaine Metabolite: NEGATIVE ng/mL (ref <150)
Creatinine: 90 mg/dL (ref >=20.0)
MDMA: NEGATIVE ng/mL (ref <500)
Marijuana Metabolite: 199 ng/mL — ABNORMAL HIGH (ref <5)
Marijuana Metabolite: POSITIVE ng/mL — AB (ref <20)
Opiates: NEGATIVE ng/mL (ref <100)
Oxidant: NEGATIVE ug/mL (ref <200)
Oxycodone: NEGATIVE ng/mL (ref <100)
pH: 5.9 (ref 4.5–9.0)

## 2024-11-11 LAB — DM TEMPLATE

## 2024-11-15 ENCOUNTER — Other Ambulatory Visit: Payer: Self-pay | Admitting: Internal Medicine

## 2024-11-15 DIAGNOSIS — F3177 Bipolar disorder, in partial remission, most recent episode mixed: Secondary | ICD-10-CM

## 2024-11-15 NOTE — Telephone Encounter (Unsigned)
 Copied from CRM [id number]. Topic: Clinical - Medication Refill >> Nov 15, 2024 11:59 AM Carlyon D wrote: Medication:  lamoTRIgine  (LAMICTAL ) 200 MG tablet   Has the patient contacted their pharmacy? Yes (Agent: If no, request that the patient contact the pharmacy for the refill. If patient does not wish to contact the pharmacy document the reason why and proceed with request.) (Agent: If yes, when and what did the pharmacy advise?)  This is the patient's preferred pharmacy:  [id number] - [place], [place] - [address] [place] [address] Phone: [phone] Fax: [phone]  Is this the correct pharmacy for this prescription? Yes If no, delete pharmacy and type the correct one.   Has the prescription been filled recently? No  Is the patient out of the medication? Yes  Has the patient been seen for an appointment in the last year OR does the patient have an upcoming appointment? Yes  Can we respond through MyChart? Yes but prefers phone call   Agent: Please be advised that Rx refills may take up to 3 business days. We ask that you follow-up with your pharmacy.

## 2024-11-16 ENCOUNTER — Ambulatory Visit

## 2024-11-16 NOTE — Telephone Encounter (Signed)
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 I'd like to get her in for a colpo with me or Melissa in the next 4-6 weeks. Thanks! Kat

## 2024-11-16 NOTE — Telephone Encounter (Signed)
 Spoke with Ms. Orlowski and relayed message from Dr. 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 Pt verbalized understanding and has been scheduled for a colposcopy on January 9th at 1pm. Pt agreed to date and time and thanked the office for calling.

## 2024-11-16 NOTE — Telephone Encounter (Signed)
 Attempted to reach patient to relay result message from provider and schedule an appt. For a colposcopy on January 9th. Unable to leave a message due to patient's mailbox being full.

## 2024-11-17 ENCOUNTER — Other Ambulatory Visit: Payer: Self-pay | Admitting: Internal Medicine

## 2024-11-17 DIAGNOSIS — F3177 Bipolar disorder, in partial remission, most recent episode mixed: Secondary | ICD-10-CM

## 2024-11-17 MED ORDER — LAMOTRIGINE 200 MG PO TABS: 400.0000 mg | ORAL_TABLET | Freq: Every day | ORAL | 0 refills | Status: DC

## 2024-11-27 ENCOUNTER — Ambulatory Visit: Admitting: Dermatology

## 2024-12-03 ENCOUNTER — Other Ambulatory Visit: Payer: Self-pay | Admitting: Family Medicine

## 2024-12-04 ENCOUNTER — Ambulatory Visit: Attending: Cardiology | Admitting: *Deleted

## 2024-12-04 DIAGNOSIS — Z954 Presence of other heart-valve replacement: Secondary | ICD-10-CM

## 2024-12-04 DIAGNOSIS — I350 Nonrheumatic aortic (valve) stenosis: Secondary | ICD-10-CM | POA: Diagnosis not present

## 2024-12-04 DIAGNOSIS — Q2381 Bicuspid aortic valve: Secondary | ICD-10-CM | POA: Diagnosis not present

## 2024-12-04 DIAGNOSIS — Z7901 Long term (current) use of anticoagulants: Secondary | ICD-10-CM

## 2024-12-04 LAB — POCT INR: INR: 2.9 (ref 2.0–3.0)

## 2024-12-04 NOTE — Progress Notes (Signed)
 Description   INR-2.9; Continue taking warfarin 1 tablet daily except for 1.5 tablets on Mondays and Fridays. Recheck INR in 4 weeks. Coumadin  Clinic [phone]

## 2024-12-04 NOTE — Patient Instructions (Signed)
 Description   INR-2.9; Continue taking warfarin 1 tablet daily except for 1.5 tablets on Mondays and Fridays. Recheck INR in 4 weeks. Coumadin  Clinic [phone]

## 2024-12-05 ENCOUNTER — Telehealth: Payer: Self-pay | Admitting: *Deleted

## 2024-12-05 ENCOUNTER — Other Ambulatory Visit: Payer: Self-pay | Admitting: Gynecologic Oncology

## 2024-12-05 DIAGNOSIS — F418 Other specified anxiety disorders: Secondary | ICD-10-CM

## 2024-12-05 MED ORDER — LORAZEPAM 0.5 MG PO TABS: 0.5000 mg | ORAL_TABLET | Freq: Once | ORAL | 0 refills | Status: AC

## 2024-12-05 NOTE — Telephone Encounter (Signed)
 Spoke with patient for meaningful use call. Pt is requesting a one time dose of lorazepam  to take prior to her appointment with Dr. Viktoria this Friday, 1/9. Advised patient her message will be relayed to provider and the office will reach back out.

## 2024-12-05 NOTE — Progress Notes (Signed)
 See RN note. One time dose of ativan  sent in prior to appt.

## 2024-12-05 NOTE — Telephone Encounter (Signed)
 Attempted to reach patient to relay message from Eleanor Epps, NP. Left message stating that provider has sent in one time RX for ativan  for prior to appt. On Friday. Pt will need a ride to and from appointment and to call with any other concerns or questions.

## 2024-12-06 ENCOUNTER — Other Ambulatory Visit: Payer: Self-pay | Admitting: Family Medicine

## 2024-12-08 ENCOUNTER — Encounter: Payer: Self-pay | Admitting: Gynecologic Oncology

## 2024-12-08 ENCOUNTER — Encounter: Payer: Self-pay | Admitting: *Deleted

## 2024-12-08 ENCOUNTER — Inpatient Hospital Stay: Attending: Hematology | Admitting: Gynecologic Oncology

## 2024-12-08 VITALS — BP 132/84 | HR 66 | Temp 98.1°F | Resp 19 | Wt 110.4 lb

## 2024-12-08 DIAGNOSIS — Z9079 Acquired absence of other genital organ(s): Secondary | ICD-10-CM | POA: Diagnosis not present

## 2024-12-08 DIAGNOSIS — Z86002 Personal history of in-situ neoplasm of other and unspecified genital organs: Secondary | ICD-10-CM | POA: Diagnosis present

## 2024-12-08 DIAGNOSIS — R87629 Unspecified abnormal cytological findings in specimens from vagina: Secondary | ICD-10-CM | POA: Insufficient documentation

## 2024-12-08 DIAGNOSIS — D071 Carcinoma in situ of vulva: Secondary | ICD-10-CM

## 2024-12-08 DIAGNOSIS — Z9889 Other specified postprocedural states: Secondary | ICD-10-CM | POA: Diagnosis not present

## 2024-12-08 NOTE — Progress Notes (Signed)
 Gynecologic Oncology Return Clinic Visit  12/08/2024  Reason for Visit: colposcopy  Treatment History: Dysplasia history:  03/19/22: Vaginal cuff biopsy - VAIN1; left buttocks biopsy - VIN3, Perineal biopsy - VIN 2-3, mons biopsy - VIN 2-3. 01/2022 Vaginal pap - negative, HR HPV positive 12/2018: Pap - negative, HR HPV positive (negative for 16, 18 and 45) 05/2017: vaginal biopsies - one with benign squamous mucosa, other with VAIN I 04/2017: Vaginal pap - negative, HR HPV not detected. 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 Vaginal biopsies, one with VAIN 2, other VAIN 1. 10/2015: vaginal cuff biopsy: VAIN II.  Vulvar biopsies performed including mons and right labia, both showing condyloma. 08/2015: Vaginal pap - LSIL, HR HPV positive. 02/2014: Vaginal cuff biopsy showed condylomata acuminata, p16 negative for diffuse positive staining.  No malignancy or high-grade dysplasia.  Right labial biopsy shows condyloma. 01/2014: Vaginal pap - LSIL.  Reactive squamous cells present. HR HPV positive. 01/2009: Pathology from hysterectomy shows chronic mild inflammation of the cervix and endocervix, mesonephric hyperplasia.  No evidence of malignancy.  Secretory type endometrium. 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  Endo and ectocervical margins negative for mucosa but partially denuded.  Stromal invasion noted. 10/2008: Cervical biopsy at 6:00 shows HPV effect consistent with low-grade dysplasia.  ECC with 1 fragment of ectocervical/squamous metaplasia with low-grade intraepithelial lesion and focal changes consistent with CIN-2. 07/2020: ECC shows benign endocervix, no dysplasia. 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  Cervical biopsy at 1:00 showed squamous mucosa with koilocytic atypia, no dysplasia identified.  ECC shows rare detached fragments of slightly  dysplastic squamous mucosa with benign endocervical mucosa.   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   07/23/22: Vulvar biopsy given symptoms at prior healing incision shows VIN3.    08/12/22: Partial simple anterior left vulvectomy. Pathology: VIN3, margins negative. Right mons biopsy and posterior fourchette biopsy negative for dysplasia or malignancy.   01/07/23: Left peri-anal biopsy shows reactive squamous mucosa, no dysplasia.   08/26/23: Pap - ASCUS, HR HPV + (16/18/45 negative)   10/2023: Colposcopy - vaginal cuff biopsy with VAIN1   Last seen in the office by Dr. Viktoria on 04/27/2024 with plan for follow up in six months.   New mons and vulvar biopsy at time of October 2025 visit, biopsies performed. Left vulvar biopsy with VIN1. Right mons biopsy with inflammatory scale crust, no epithelium present.  Interval History: Doing well.  Past Medical/Surgical History: Past Medical History:  Diagnosis Date   ADD (attention deficit disorder)    Alcohol abuse, in remission 2012   per pt in remission since 2012   Anticoagulated on Coumadin     coumadin --- managed by cardiology/ coumadin  clinic   Arthritis    In hips   Asthma    allergy induced, rare inhaler use   Auditory processing disorder    per pt very HOH, first language ASL   Basal cell carcinoma    Bipolar disorder (HCC)    Chronic left hip pain    Chronic vertigo    Complication of anesthesia    per pt due to auditory processing disorder pt is unable to hear when waking up and also gets very anxious   Family history of uterine cancer    GAD (generalized anxiety disorder)    GERD (gastroesophageal reflux disease)  History of cervical dysplasia    History of condyloma acuminatum 2017   s/p laser ablation vulva   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   biopsy and CO2 laser ablation   HOH (hard of hearing)    per pt very HOH due to auditroy processing disorder    Hyperlipidemia    Hypertension    Muscle spasms of both lower extremities    both hips   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   23 mm Sorin [employer] [place] bileaflet mechanical valve via right anterior mini thoracotomy   Vulvar dysplasia     Past Surgical History:  Procedure Laterality Date   ANTERIOR CRUCIATE LIGAMENT REPAIR  1993   AORTIC VALVE REPLACEMENT N/A 11/03/2017   Procedure: MINIMALLY INVASIVE AORTIC VALVE REPLACEMENT( MINI THORACOTOMY);  Surgeon: Dusty Sudie DEL, MD;  Location: [facility] OR;  Service: Open Heart Surgery;  Laterality: N/A;   CERVICAL BIOPSY  W/ LOOP ELECTRODE EXCISION  2010   CIN III w/extension to glands   CHOLECYSTECTOMY N/A 07/11/2023   Procedure: LAPAROSCOPIC CHOLECYSTECTOMY WITH POSSIBLE INTRAOPERATIVE CHOLANGIOGRAM;  Surgeon: Signe Mitzie LABOR, MD;  Location: [facility] OR;  Service: General;  Laterality: N/A;   CLOSED REDUCTION HIP DISLOCATION Left 1981   CO2 LASER APPLICATION N/A 05/12/2022   Procedure: CO2 LASER APPLICATION TO VULVA;  Surgeon: Viktoria Comer SAUNDERS, MD;  Location: [facility];  Service: Gynecology;  Laterality: N/A;   COLPOSCOPY  10/30/2008   CIN I & II   COLPOSCOPY  07/31/2000   Neg. ECC   COLPOSCOPY  06/30/2001   CIN I   COLPOSCOPY  08/30/2004   ECC--atypia   COLPOSCOPY N/A 01/21/2016   Procedure: COLPOSCOPY with vaginal biopsy with CO 2 Laser of Vaginal and vulvar condyloma;  Surgeon: Bobie FORBES Cathlyn JAYSON Nikki, MD;  Location: [facility] ORS;  Service: Gynecology;  Laterality: N/A;  [name] will be here 2/21 for 1115 case confirmed 01/16/15 - TS   HARDWARE REMOVAL Left 1992   Left hip   INGUINAL HERNIA REPAIR Bilateral    1981;  [id number]   LEFT AND RIGHT HEART CATHETERIZATION WITH CORONARY ANGIOGRAM N/A 05/18/2014   Procedure: LEFT AND RIGHT HEART CATHETERIZATION WITH CORONARY ANGIOGRAM;  Surgeon: Candyce GORMAN Reek, MD;  Location: [facility] CATH LAB;  Service: Cardiovascular;  Laterality:  N/A;   LESION EXCISION WITH COMPLEX REPAIR N/A 05/23/2024   Procedure: LESION EXCISION WITH COMPLEX REPAIR;  Surgeon: Waddell Leonce NOVAK, MD;  Location:  SURGERY CENTER;  Service: Plastics;  Laterality: N/A;  Excision of facial lesion, right upper lip   LESION REMOVAL Right 05/12/2022   Procedure: ANTONETTE OF RIGHT CHEEK CYST;  Surgeon: Elisabeth Craig GORMAN, MD;  Location: [facility];  Service: Plastics;  Laterality: Right;   LYMPH NODE BIOPSY  1995   NASAL SEPTUM SURGERY  2002   ORIF HIP FRACTURE Left 1990   per pt and  took out growth plate in Right knee   ROBOTIC ASSISTED TOTAL HYSTERECTOMY  02/26/2009   @[facility]    TEE WITHOUT CARDIOVERSION N/A 11/03/2017   Procedure: TRANSESOPHAGEAL ECHOCARDIOGRAM (TEE);  Surgeon: Dusty Sudie DEL, MD;  Location: [facility] OR;  Service: Open Heart Surgery;  Laterality: N/A;   TONSILLECTOMY AND ADENOIDECTOMY  1990   TYMPANOSTOMY TUBE PLACEMENT Bilateral    x3  per pt last one 1982   VULVA /PERINEUM BIOPSY N/A 05/12/2022   Procedure: POSSIBLE VULVAR BIOPSY; POSSIBLE VAGINAL BIOPSY;  Surgeon: Viktoria Comer SAUNDERS, MD;  Location: [facility];  Service: Gynecology;  Laterality: N/A;   VULVECTOMY N/A 05/12/2022   Procedure: WIDE EXCISION VULVECTOMY;  Surgeon: Viktoria Comer SAUNDERS, MD;  Location: [facility];  Service: Gynecology;  Laterality: N/A;   VULVECTOMY N/A 08/12/2022   Procedure: WIDE LOCAL EXCISION VULVECTOMY;  Surgeon: Viktoria Comer SAUNDERS, MD;  Location: [facility];  Service: Gynecology;  Laterality: N/A;    Family History  Problem Relation Age of Onset   Hyperlipidemia Mother    Hypertension Mother    Thyroid  disease Mother    Other Mother        Pituitary tumor   Hyperlipidemia Father    Hypertension Father    Migraines Father    Skin cancer Father    Hypertension Brother    Hyperlipidemia Brother    Heart attack Paternal Uncle    Thyroid  disease Maternal Grandmother    Uterine cancer  Maternal Grandmother 39   Dementia Maternal Grandmother    Thyroid  disease Maternal Grandfather    Prostate cancer Maternal Grandfather 25   Dementia Paternal Grandmother    Heart attack Paternal Grandfather    Colon cancer Paternal Great-grandmother     Social History   Socioeconomic History   Marital status: Single    Spouse name: Not on file   Number of children: Not on file   Years of education: Not on file   Highest education level: Not on file  Occupational History   Not on file  Tobacco Use   Smoking status: Every Day    Current packs/day: 0.25    Average packs/day: 0.3 packs/day for 20.0 years (5.0 ttl pk-yrs)    Types: Cigarettes   Smokeless tobacco: Never  Vaping Use   Vaping status: Never Used  Substance and Sexual Activity   Alcohol use: No    Comment: in remission since 2012   Drug use: Yes    Types: Marijuana    Comment: 08-11-22 last, per pt smokes on average 1/4oz per week   Sexual activity: Yes    Partners: Male    Birth control/protection: Surgical    Comment: Hysterectomy  Other Topics Concern   Not on file  Social History Narrative   Not on file   Social Drivers of Health   Tobacco Use: High Risk (12/08/2024)   Patient History    Smoking Tobacco Use: Every Day    Smokeless Tobacco Use: Never    Passive Exposure: Not on file  Financial Resource Strain: Not on file  Food Insecurity: No Food Insecurity (07/07/2023)   Hunger Vital Sign    Worried About Running Out of Food in the Last Year: Never true    Ran Out of Food in the Last Year: Never true  Transportation Needs: No Transportation Needs (07/07/2023)   PRAPARE - Administrator, Civil Service (Medical): No    Lack of Transportation (Non-Medical): No  Physical Activity: Not on file  Stress: Not on file  Social Connections: Not on file  Depression (PHQ2-9): Low Risk (11/02/2024)   Depression (PHQ2-9)    PHQ-2 Score: 0  Alcohol Screen: Not on file  Housing: Low Risk (07/07/2023)    Housing    Last Housing Risk Score: 0  Utilities: Not At Risk (07/07/2023)   AHC Utilities    Threatened with loss of utilities: No  Health Literacy: Not on file    Current Medications: Current Medications[1]  Review of Systems: Denies appetite changes, fevers, chills, fatigue, unexplained weight changes. Denies  hearing loss, neck lumps or masses, mouth sores, ringing in ears or voice changes. Denies cough or wheezing.  Denies shortness of breath. Denies chest pain or palpitations. Denies leg swelling. Denies abdominal distention, pain, blood in stools, constipation, diarrhea, nausea, vomiting, or early satiety. Denies pain with intercourse, dysuria, frequency, hematuria or incontinence. Denies hot flashes, pelvic pain, vaginal bleeding or vaginal discharge.   Denies joint pain, back pain or muscle pain/cramps. Denies itching, rash, or wounds. Denies dizziness, headaches, numbness or seizures. Denies swollen lymph nodes or glands, denies easy bruising or bleeding. Denies anxiety, depression, confusion, or decreased concentration.  Physical Exam: BP 132/84 (BP Location: Left Arm, Patient Position: Sitting)   Pulse 66   Temp 98.1 F (36.7 C) (Oral)   Resp 19   Wt 110 lb 6.4 oz (50.1 kg)   SpO2 99%   BMI 22.30 kg/m  General: Alert, oriented, no acute distress. HEENT: Posterior oropharynx clear, sclera anicteric.  GU: Normal appearing external genitalia without erythema, excoriation, or lesions.  Speculum exam reveals On speculum exam, vaginal mucosa is well-rugated, no visible lesions.   Colposcopy procedure Preoperative diagnosis: Recent abnormal Pap Postoperative diagnosis: Same as above Physician: Viktoria MD Estimated blood loss: None Specimens: None Procedure: After the procedure was discussed with the patient including risks and benefits, she gave verbal consent.  She was then placed in dorsolithotomy position and a speculum was placed in the vagina.  Once the upper  vagina was visualized, 5% acetic acid  was applied.  Colposcope was then used to inspect the entire upper vagina and cuff.  No areas of acetowhite, punctations, or mosaicism noted.  Speculum was removed from the vagina.  Overall the patient tolerated the procedure well.  All instruments were removed from the vagina.  Laboratory & Radiologic Studies: Pap 11/03/24: ASCUS, HR HPV +  Assessment & Plan: Danielle Obrien is a 44 y.o. 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  Developed symptoms at the site of her wide local excision with biopsy proven VIN3 resected in 07/2022 with negative margins.  Recent Pap ASCUS, high risk HPV positive. Colposcopy with VAIN1 on biopsy in 10/2023.  Patient doing well.  Colposcopy performed today without any acetowhite areas concerning for dysplasia.  No biopsy performed.  We will plan on repeat Pap and cotesting in 1 year.  20 minutes of total time was spent for this patient encounter, including preparation, face-to-face counseling with the patient and coordination of care, and documentation of the encounter.  Comer Viktoria, MD  Division of Gynecologic Oncology  Department of Obstetrics and Gynecology  [facility]      [1]  Current Outpatient Medications:    albuterol  (VENTOLIN  HFA) 108 (90 Base) MCG/ACT inhaler, INHALE TWO PUFFS BY MOUTH EVERY 6 HOURS AS NEEDED FOR WHEEZING OR SHORTNESS OF BREATH, Disp: 8.5 g, Rfl: 1   Albuterol -Budesonide  (AIRSUPRA ) 90-80 MCG/ACT AERO, Inhale 2 Inhalations into the lungs every 4 (four) hours as needed., Disp: 30 g, Rfl: 5   aspirin  EC 81 MG tablet, Take 81 mg by mouth daily. Swallow whole., Disp: , Rfl:    benzonatate  (TESSALON ) 100 MG capsule, Take 1 capsule (100 mg total) by mouth every 8 (eight) hours. (Patient taking differently: Take 100 mg by mouth 3 (three) times daily as needed for cough.), Disp: 21 capsule, Rfl: 0   DULoxetine  (CYMBALTA ) 20 MG  capsule, TAKE 2 CAPSULES BY MOUTH DAILY, Disp: 180 capsule, Rfl: 0  gabapentin  (NEURONTIN ) 300 MG capsule, Take 2 capsules (600 mg total) by mouth 3 (three) times daily., Disp: 540 capsule, Rfl: 0   lamoTRIgine  (LAMICTAL ) 200 MG tablet, Take 2 tablets (400 mg total) by mouth daily., Disp: 60 tablet, Rfl: 0   LORazepam  (ATIVAN ) 0.5 MG tablet, Take 1 tablet (0.5 mg total) by mouth once for 1 dose. Take 30 minutes before your appointment. Do not take and drive. Use caution, may cause drowsiness., Disp: 1 tablet, Rfl: 0   meclizine  (ANTIVERT ) 12.5 MG tablet, TAKE ONE TABLET BY MOUTH TWICE A DAY AS NEEDED FOR DIZZINESS, Disp: 20 tablet, Rfl: 0   metoprolol  tartrate (LOPRESSOR ) 25 MG tablet, Take 0.5 tablets (12.5 mg total) by mouth daily., Disp: 45 tablet, Rfl: 0   olmesartan  (BENICAR ) 20 MG tablet, TAKE 1 TABLET BY MOUTH DAILY, Disp: 30 tablet, Rfl: 0   tiZANidine  (ZANAFLEX ) 4 MG tablet, Take 1 tablet (4 mg total) by mouth at bedtime., Disp: 30 tablet, Rfl: 0   traMADol  (ULTRAM ) 50 MG tablet, Take 2 tablets (100 mg total) by mouth 2 (two) times daily., Disp: 120 tablet, Rfl: 0   warfarin (COUMADIN ) 7.5 MG tablet, TAKE ONE TO ONE AND ONE-HALF (1 TO 1.5) TABLETS BY MOUTH DAILY AS DIRECTED BY ANTICOAGULATION CLINIC, Disp: 40 tablet, Rfl: 2

## 2024-12-08 NOTE — Patient Instructions (Signed)
 I did not see any concerning areas today that required a biopsy.  We will plan to continue with our scheduled follow-up.  You will be due for a repeat Pap and HPV testing in 1 year.

## 2024-12-12 ENCOUNTER — Other Ambulatory Visit: Payer: Self-pay | Admitting: Family Medicine

## 2024-12-14 ENCOUNTER — Other Ambulatory Visit: Payer: Self-pay | Admitting: Internal Medicine

## 2024-12-14 DIAGNOSIS — F3177 Bipolar disorder, in partial remission, most recent episode mixed: Secondary | ICD-10-CM

## 2024-12-18 ENCOUNTER — Encounter: Payer: Self-pay | Admitting: Dermatology

## 2024-12-18 ENCOUNTER — Ambulatory Visit: Payer: Self-pay | Admitting: Dermatology

## 2024-12-18 DIAGNOSIS — L905 Scar conditions and fibrosis of skin: Secondary | ICD-10-CM

## 2024-12-18 DIAGNOSIS — D696 Thrombocytopenia, unspecified: Secondary | ICD-10-CM

## 2024-12-18 DIAGNOSIS — Z85828 Personal history of other malignant neoplasm of skin: Secondary | ICD-10-CM

## 2024-12-18 DIAGNOSIS — C4491 Basal cell carcinoma of skin, unspecified: Secondary | ICD-10-CM

## 2024-12-18 NOTE — Patient Instructions (Signed)

## 2024-12-18 NOTE — Progress Notes (Signed)
" ° °  Follow Up Visit   Subjective  Danielle Obrien is a 44 y.o. female who presents for the following: follow up from Mohs surgery   The patient presents for follow up from Mohs surgery for a  BCC on the right cutaneous lip, treated on 07/04/24, repaired with Advancement flap. The patient has been bandaging the wound as directed. The endorse the following concerns: none. She is accompanied by her ASL interpreter.  The following portions of the chart were reviewed this encounter and updated as appropriate: medications, allergies, medical history  Review of Systems:  No other skin or systemic complaints except as noted in HPI or Assessment and Plan.  Objective  Well appearing patient in no apparent distress; mood and affect are within normal limits.  A focal examination was performed including  face and  All findings within normal limits unless otherwise noted below.  Healing wound with mild erythema  Relevant physical exam findings are noted in the Assessment and Plan.    Assessment & Plan   Scar s/p Mohs for  [facility] on the right cutaneous lip, treated on 07/04/24, repaired with Advancement flap  - Reassured that wound is healing well - No evidence of infection - No swelling, induration, purulence, dehiscence, or tenderness out of proportion to the clinical exam, see photo above - Discussed that scars take up to 12 months to mature from the date of surgery - Recommend SPF 30+ to scar daily to prevent purple color from UV exposure during scar maturation process - Discussed that erythema and raised appearance of scar will fade over the next 4-6 months - OK to start scar massage at 4-6 weeks post-op  HISTORY OF BASAL CELL CARCINOMA OF THE SKIN - No evidence of recurrence today - Recommend regular full body skin exams - Recommend daily broad spectrum sunscreen SPF 30+ to sun-exposed areas, reapply every 2 hours as needed.  - Call if any new or changing lesions are noted between office  visits  Thrombocytopenia-  Last Platelets- 164k (11/02/2024)   Return in about 6 months (around 06/17/2025) for wound check.  I, Darice Smock, CMA, am acting as scribe for RUFUS CHRISTELLA HOLY, MD.   Documentation: I have reviewed the above documentation for accuracy and completeness, and I agree with the above.  RUFUS CHRISTELLA HOLY, MD  "

## 2024-12-21 ENCOUNTER — Ambulatory Visit: Payer: Self-pay | Admitting: Internal Medicine

## 2024-12-21 ENCOUNTER — Telehealth: Payer: Self-pay | Admitting: Family Medicine

## 2024-12-21 ENCOUNTER — Ambulatory Visit (INDEPENDENT_AMBULATORY_CARE_PROVIDER_SITE_OTHER)

## 2024-12-21 ENCOUNTER — Encounter: Payer: Self-pay | Admitting: Internal Medicine

## 2024-12-21 VITALS — BP 142/78 | HR 77 | Temp 98.2°F | Resp 16 | Ht 59.0 in | Wt 114.4 lb

## 2024-12-21 DIAGNOSIS — R052 Subacute cough: Secondary | ICD-10-CM

## 2024-12-21 DIAGNOSIS — I10 Essential (primary) hypertension: Secondary | ICD-10-CM

## 2024-12-21 DIAGNOSIS — J0100 Acute maxillary sinusitis, unspecified: Secondary | ICD-10-CM | POA: Diagnosis not present

## 2024-12-21 DIAGNOSIS — J4530 Mild persistent asthma, uncomplicated: Secondary | ICD-10-CM

## 2024-12-21 DIAGNOSIS — E785 Hyperlipidemia, unspecified: Secondary | ICD-10-CM | POA: Diagnosis not present

## 2024-12-21 DIAGNOSIS — F1190 Opioid use, unspecified, uncomplicated: Secondary | ICD-10-CM | POA: Diagnosis not present

## 2024-12-21 DIAGNOSIS — R739 Hyperglycemia, unspecified: Secondary | ICD-10-CM

## 2024-12-21 DIAGNOSIS — J22 Unspecified acute lower respiratory infection: Secondary | ICD-10-CM | POA: Diagnosis not present

## 2024-12-21 DIAGNOSIS — Z0000 Encounter for general adult medical examination without abnormal findings: Secondary | ICD-10-CM

## 2024-12-21 DIAGNOSIS — F3177 Bipolar disorder, in partial remission, most recent episode mixed: Secondary | ICD-10-CM

## 2024-12-21 DIAGNOSIS — Z0001 Encounter for general adult medical examination with abnormal findings: Secondary | ICD-10-CM

## 2024-12-21 LAB — LIPID PANEL
Cholesterol: 264 mg/dL — ABNORMAL HIGH (ref 28–200)
HDL: 83.9 mg/dL
LDL Cholesterol: 171 mg/dL — ABNORMAL HIGH (ref 10–99)
NonHDL: 180.59
Total CHOL/HDL Ratio: 3
Triglycerides: 48 mg/dL (ref 10.0–149.0)
VLDL: 9.6 mg/dL (ref 0.0–40.0)

## 2024-12-21 LAB — BASIC METABOLIC PANEL WITH GFR
BUN: 12 mg/dL (ref 6–23)
CO2: 26 meq/L (ref 19–32)
Calcium: 9.2 mg/dL (ref 8.4–10.5)
Chloride: 106 meq/L (ref 96–112)
Creatinine, Ser: 0.82 mg/dL (ref 0.40–1.20)
GFR: 86.88 mL/min
Glucose, Bld: 62 mg/dL — ABNORMAL LOW (ref 70–99)
Potassium: 3.8 meq/L (ref 3.5–5.1)
Sodium: 139 meq/L (ref 135–145)

## 2024-12-21 LAB — HEPATIC FUNCTION PANEL
ALT: 17 U/L (ref 3–35)
AST: 23 U/L (ref 5–37)
Albumin: 4.4 g/dL (ref 3.5–5.2)
Alkaline Phosphatase: 85 U/L (ref 39–117)
Bilirubin, Direct: 0 mg/dL — ABNORMAL LOW (ref 0.1–0.3)
Total Bilirubin: 0.4 mg/dL (ref 0.2–1.2)
Total Protein: 7.1 g/dL (ref 6.0–8.3)

## 2024-12-21 LAB — URINALYSIS, ROUTINE W REFLEX MICROSCOPIC
Bilirubin Urine: NEGATIVE
Ketones, ur: NEGATIVE
Leukocytes,Ua: NEGATIVE
Nitrite: NEGATIVE
Specific Gravity, Urine: 1.015 (ref 1.000–1.030)
Total Protein, Urine: NEGATIVE
Urine Glucose: NEGATIVE
Urobilinogen, UA: 1 (ref 0.0–1.0)
WBC, UA: NONE SEEN
pH: 7 (ref 5.0–8.0)

## 2024-12-21 LAB — TSH: TSH: 0.68 u[IU]/mL (ref 0.35–5.50)

## 2024-12-21 LAB — HEMOGLOBIN A1C: Hgb A1c MFr Bld: 5.3 % (ref 4.6–6.5)

## 2024-12-21 MED ORDER — LAMOTRIGINE 200 MG PO TABS: 200.0000 mg | ORAL_TABLET | Freq: Every day | ORAL | 1 refills | Status: AC

## 2024-12-21 MED ORDER — BUDESONIDE-FORMOTEROL FUMARATE 80-4.5 MCG/ACT IN AERO: 2.0000 | INHALATION_SPRAY | Freq: Two times a day (BID) | RESPIRATORY_TRACT | 1 refills | Status: AC

## 2024-12-21 MED ORDER — OLMESARTAN MEDOXOMIL 20 MG PO TABS: 20.0000 mg | ORAL_TABLET | Freq: Every day | ORAL | 1 refills | Status: AC

## 2024-12-21 MED ORDER — CEFPODOXIME PROXETIL 200 MG PO TABS: 200.0000 mg | ORAL_TABLET | Freq: Two times a day (BID) | ORAL | 0 refills | Status: AC

## 2024-12-21 MED ORDER — PROMETHAZINE-DM 6.25-15 MG/5ML PO SYRP: 5.0000 mL | ORAL_SOLUTION | Freq: Four times a day (QID) | ORAL | 0 refills | Status: AC | PRN

## 2024-12-21 MED ORDER — AIRSUPRA 90-80 MCG/ACT IN AERO: 2.0000 | INHALATION_SPRAY | RESPIRATORY_TRACT | 5 refills | Status: AC | PRN

## 2024-12-21 NOTE — Progress Notes (Signed)
 "  Subjective:  Patient ID: Danielle Obrien, female    DOB: 1981-10-31  Age: 44 y.o. MRN: [id number]  CC: Annual Exam, Cough, Hypertension, Hyperlipidemia, and COPD   HPI Danielle Obrien presents for a CPX and f/up   Discussed the use of AI scribe software for clinical note transcription with the patient, who gave verbal consent to proceed.  History of Present Illness Danielle Obrien is a 44 year old female who presents with sinus pain and cough.  She experiences significant sinus pain, particularly on the right side, which she attributes to swelling following sinus surgery. The sinus drainage is obstructed, causing it to drain to the left side. She uses an over-the-counter decongestant, Tylenol  Cold and Congestion, once a day to manage symptoms but tries to limit its use due to its impact on her blood pressure.  She has had a persistent cough for about a month, producing yellow-green phlegm. Wheezing occurs, particularly when lying on her left side, and she uses a steroid inhaler twice daily, which she finds helpful. She has not had a chest x-ray during this period.  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 She describes a sensation of 'ice' in the valve when exposed to cold temperatures.  She reports high blood pressure, attributing it to the use of decongestants. She recently obtained tramadol  for pain management, which she had difficulty acquiring due to insurance issues. Significant pain from previous surgeries, particularly in her hip, affects her sleep and daily activities.  She has a history of pneumonia and still has some of the steroid inhaler prescribed during that time. She has not received a COVID vaccine due to a previous adverse reaction and has not had a flu shot this year, as she typically receives it every other year.  No dizziness, lightheadedness, stomach ache, or urinary symptoms. Bowel movements  are normal.     Outpatient Medications Prior to Visit  Medication Sig Dispense Refill   aspirin  EC 81 MG tablet Take 81 mg by mouth daily. Swallow whole.     DULoxetine  (CYMBALTA ) 20 MG capsule TAKE 2 CAPSULES BY MOUTH DAILY 180 capsule 0   gabapentin  (NEURONTIN ) 300 MG capsule Take 2 capsules (600 mg total) by mouth 3 (three) times daily. 540 capsule 0   meclizine  (ANTIVERT ) 12.5 MG tablet TAKE ONE TABLET BY MOUTH TWICE A DAY AS NEEDED FOR DIZZINESS 20 tablet 0   metoprolol  tartrate (LOPRESSOR ) 25 MG tablet Take 0.5 tablets (12.5 mg total) by mouth daily. 45 tablet 0   tiZANidine  (ZANAFLEX ) 4 MG tablet TAKE 1 TABLET BY MOUTH AT BEDTIME 30 tablet 0   traMADol  (ULTRAM ) 50 MG tablet Take 2 tablets (100 mg total) by mouth 2 (two) times daily. 120 tablet 0   warfarin (COUMADIN ) 7.5 MG tablet TAKE ONE TO ONE AND ONE-HALF (1 TO 1.5) TABLETS BY MOUTH DAILY AS DIRECTED BY ANTICOAGULATION CLINIC 40 tablet 2   albuterol  (VENTOLIN  HFA) 108 (90 Base) MCG/ACT inhaler INHALE TWO PUFFS BY MOUTH EVERY 6 HOURS AS NEEDED FOR WHEEZING OR SHORTNESS OF BREATH 8.5 g 1   Albuterol -Budesonide  (AIRSUPRA ) 90-80 MCG/ACT AERO Inhale 2 Inhalations into the lungs every 4 (four) hours as needed. 30 g 5   benzonatate  (TESSALON ) 100 MG capsule Take 1 capsule (100 mg total) by mouth every 8 (eight) hours. (Patient taking differently: Take 100 mg by mouth 3 (three) times daily as needed for cough.) 21 capsule 0   lamoTRIgine  (LAMICTAL )  200 MG tablet TAKE 2 TABLETS BY MOUTH DAILY *NEED APPOINTMENT FOR FURTHER REFILLS, PER MD* 60 tablet 0   olmesartan  (BENICAR ) 20 MG tablet TAKE 1 TABLET BY MOUTH DAILY 30 tablet 0   No facility-administered medications prior to visit.    ROS Review of Systems  Constitutional:  Negative for appetite change, chills, diaphoresis, fatigue and fever.  HENT:  Positive for congestion, hearing loss, postnasal drip, rhinorrhea, sinus pressure and sinus pain. Negative for facial swelling, nosebleeds,  sore throat and trouble swallowing.   Respiratory:  Positive for cough and wheezing. Negative for chest tightness and shortness of breath.   Cardiovascular:  Positive for chest pain. Negative for palpitations and leg swelling.  Gastrointestinal:  Negative for abdominal pain, constipation, diarrhea and vomiting.  Genitourinary: Negative.  Negative for difficulty urinating.  Musculoskeletal:  Positive for arthralgias and back pain. Negative for myalgias.  Neurological:  Negative for dizziness, speech difficulty, weakness, light-headedness and headaches.  Hematological:  Negative for adenopathy. Does not bruise/bleed easily.  Psychiatric/Behavioral: Negative.      Objective:  BP (!) 142/78 (BP Location: Left Arm, Patient Position: Sitting, Cuff Size: Normal)   Pulse 77   Temp 98.2 F (36.8 C) (Oral)   Resp 16   Ht 4' 11 (1.499 m)   Wt 114 lb 6.4 oz (51.9 kg)   SpO2 98%   BMI 23.11 kg/m   BP Readings from Last 3 Encounters:  12/21/24 (!) 142/78  12/08/24 132/84  11/09/24 124/84    Wt Readings from Last 3 Encounters:  12/21/24 114 lb 6.4 oz (51.9 kg)  12/08/24 110 lb 6.4 oz (50.1 kg)  11/09/24 113 lb (51.3 kg)    Physical Exam Vitals reviewed.  Constitutional:      Appearance: Normal appearance.  HENT:     Nose: Nose normal.     Mouth/Throat:     Mouth: Mucous membranes are moist.  Eyes:     General: No scleral icterus.    Conjunctiva/sclera: Conjunctivae normal.  Cardiovascular:     Rate and Rhythm: Normal rate and regular rhythm.     Heart sounds: Murmur heard.     Systolic murmur is present with a grade of 1/6.     No friction rub. No gallop.     Comments: +click Pulmonary:     Effort: Pulmonary effort is normal.     Breath sounds: No stridor. No wheezing, rhonchi or rales.  Abdominal:     General: Abdomen is flat. Bowel sounds are normal.     Palpations: There is no mass.     Tenderness: There is no abdominal tenderness. There is no guarding.     Hernia: No  hernia is present.  Musculoskeletal:     Cervical back: Neck supple. No rigidity.     Right lower leg: No edema.     Left lower leg: No edema.  Skin:    General: Skin is warm and dry.  Neurological:     General: No focal deficit present.     Mental Status: She is alert.  Psychiatric:        Mood and Affect: Mood normal.        Behavior: Behavior normal.        Thought Content: Thought content normal.        Judgment: Judgment normal.     Lab Results  Component Value Date   WBC 5.2 11/02/2024   HGB 13.9 11/02/2024   HCT 40.9 11/02/2024   PLT 164 11/02/2024  GLUCOSE 62 (L) 12/21/2024   CHOL 264 (H) 12/21/2024   TRIG 48.0 12/21/2024   HDL 83.90 12/21/2024   LDLDIRECT 171.0 01/27/2013   LDLCALC 171 (H) 12/21/2024   ALT 17 12/21/2024   AST 23 12/21/2024   NA 139 12/21/2024   K 3.8 12/21/2024   CL 106 12/21/2024   CREATININE 0.82 12/21/2024   BUN 12 12/21/2024   CO2 26 12/21/2024   TSH 0.68 12/21/2024   INR 2.9 12/04/2024   HGBA1C 5.3 12/21/2024    DG Chest 2 View Result Date: 12/21/2024 CLINICAL DATA:  Cough and wheezing x2 months. EXAM: CHEST - 2 VIEW COMPARISON:  January 11, 2024 FINDINGS: A sternal fixation plate and screws are seen. The heart size and mediastinal contours are within normal limits. An artificial aortic valve is noted. Both lungs are clear. The visualized skeletal structures are unremarkable. IMPRESSION: No active cardiopulmonary disease. Electronically Signed   By: Suzen Dials M.D.   On: 12/21/2024 11:16    The 10-year ASCVD risk score (Arnett DK, et al., 2019) is: 3.6%   Values used to calculate the score:     Age: 79 years     Clinically relevant sex: Female     Is Non-Hispanic African American: No     Diabetic: No     Tobacco smoker: Yes     Systolic Blood Pressure: 142 mmHg     Is BP treated: Yes     HDL Cholesterol: 83.9 mg/dL     Total Cholesterol: 264 mg/dL   Assessment & Plan:  Hyperlipidemia with target LDL less than 130-  Statin is not indicated. -     TSH; Future -     Hepatic function panel; Future  Essential hypertension, benign- BP is not at goal. She will discontinue the decongestant and will restart the ARB. -     Basic metabolic panel with GFR; Future -     TSH; Future -     Urinalysis, Routine w reflex microscopic; Future -     Hepatic function panel; Future -     Olmesartan  Medoxomil; Take 1 tablet (20 mg total) by mouth daily.  Dispense: 90 tablet; Refill: 1  Chronic hyperglycemia -     Hemoglobin A1c; Future  Subacute cough -     DG Chest 2 View; Future  Encounter for general adult medical examination with abnormal findings- Exam completed, labs reviewed, vaccines reviewed (she refused), cancer screenings addressed, pt ed material was given.  -     Lipid panel; Future  Chronic, continuous use of opioids -     POCT Urine drug screen  LRTI (lower respiratory tract infection) -     Cefpodoxime  Proxetil; Take 1 tablet (200 mg total) by mouth 2 (two) times daily for 10 days.  Dispense: 20 tablet; Refill: 0 -     Promethazine -DM; Take 5 mLs by mouth 4 (four) times daily as needed for cough.  Dispense: 118 mL; Refill: 0  Acute non-recurrent maxillary sinusitis -     Cefpodoxime  Proxetil; Take 1 tablet (200 mg total) by mouth 2 (two) times daily for 10 days.  Dispense: 20 tablet; Refill: 0  Mild persistent asthma, unspecified whether complicated -     Airsupra ; Inhale 2 Inhalations into the lungs every 4 (four) hours as needed.  Dispense: 30 g; Refill: 5 -     Budesonide -Formoterol  Fumarate; Inhale 2 puffs into the lungs 2 (two) times daily.  Dispense: 30.6 g; Refill: 1  Bipolar disorder, in partial remission,  most recent episode mixed (HCC) -     lamoTRIgine ; Take 1 tablet (200 mg total) by mouth daily.  Dispense: 180 tablet; Refill: 1     Follow-up: Return in about 6 months (around 06/20/2025).  Debby Molt, MD "

## 2024-12-21 NOTE — Patient Instructions (Signed)

## 2024-12-21 NOTE — Telephone Encounter (Signed)
 Patient stopped by to scan in her [employer] card. She is having trouble getting the Tramadol  with the pharmacy. It is pending. She wanted to make sure that did not happen with her gabapentin . Is there anything else she needs to do on our end to ensure that the pharmacy is getting her insurance to be able to get her medication? Please advise.

## 2024-12-25 ENCOUNTER — Other Ambulatory Visit: Payer: Self-pay | Admitting: Family Medicine

## 2025-01-04 ENCOUNTER — Ambulatory Visit: Payer: Self-pay

## 2025-01-04 DIAGNOSIS — I350 Nonrheumatic aortic (valve) stenosis: Secondary | ICD-10-CM | POA: Diagnosis not present

## 2025-01-04 DIAGNOSIS — Q2381 Bicuspid aortic valve: Secondary | ICD-10-CM

## 2025-01-04 DIAGNOSIS — Z7901 Long term (current) use of anticoagulants: Secondary | ICD-10-CM

## 2025-01-04 DIAGNOSIS — Z954 Presence of other heart-valve replacement: Secondary | ICD-10-CM | POA: Diagnosis not present

## 2025-01-04 LAB — POCT INR: INR: 2.8 (ref 2.0–3.0)

## 2025-01-04 NOTE — Progress Notes (Signed)
 INR  2.8  Continue taking warfarin 1 tablet daily except for 1.5 tablets on Mondays and Fridays. Recheck INR in 4 weeks. Coumadin  Clinic [phone]

## 2025-01-04 NOTE — Patient Instructions (Signed)
 Continue taking warfarin 1 tablet daily except for 1.5 tablets on Mondays and Fridays. Recheck INR in 4 weeks. Coumadin  Clinic [phone]

## 2025-02-01 ENCOUNTER — Ambulatory Visit

## 2025-02-26 ENCOUNTER — Ambulatory Visit: Admitting: Physician Assistant

## 2025-03-01 ENCOUNTER — Inpatient Hospital Stay

## 2025-03-02 ENCOUNTER — Inpatient Hospital Stay

## 2025-05-11 ENCOUNTER — Inpatient Hospital Stay: Admitting: Gynecologic Oncology

## 2025-05-17 ENCOUNTER — Ambulatory Visit: Admitting: Family Medicine

## 2025-06-18 ENCOUNTER — Ambulatory Visit: Admitting: Dermatology

## 2025-07-02 ENCOUNTER — Inpatient Hospital Stay

## 2025-11-01 ENCOUNTER — Inpatient Hospital Stay: Admitting: Nurse Practitioner

## 2025-11-01 ENCOUNTER — Inpatient Hospital Stay

## 2025-11-02 ENCOUNTER — Inpatient Hospital Stay: Admitting: Nurse Practitioner

## 2025-11-02 ENCOUNTER — Inpatient Hospital Stay
